# Patient Record
Sex: Female | Born: 1935 | Race: White | Hispanic: No | Smoking: Former smoker
Health system: Southern US, Community
[De-identification: ages and names within clinical notes are randomized; demographics above are authoritative.]

## PROBLEM LIST (undated history)

## (undated) DIAGNOSIS — F411 Generalized anxiety disorder: Secondary | ICD-10-CM

## (undated) DIAGNOSIS — M797 Fibromyalgia: Secondary | ICD-10-CM

## (undated) DIAGNOSIS — M1990 Unspecified osteoarthritis, unspecified site: Secondary | ICD-10-CM

## (undated) DIAGNOSIS — K219 Gastro-esophageal reflux disease without esophagitis: Secondary | ICD-10-CM

## (undated) DIAGNOSIS — K5730 Diverticulosis of large intestine without perforation or abscess without bleeding: Secondary | ICD-10-CM

## (undated) DIAGNOSIS — K5900 Constipation, unspecified: Secondary | ICD-10-CM

## (undated) DIAGNOSIS — J449 Chronic obstructive pulmonary disease, unspecified: Secondary | ICD-10-CM

## (undated) DIAGNOSIS — F17210 Nicotine dependence, cigarettes, uncomplicated: Secondary | ICD-10-CM

## (undated) DIAGNOSIS — M2042 Other hammer toe(s) (acquired), left foot: Secondary | ICD-10-CM

## (undated) DIAGNOSIS — I671 Cerebral aneurysm, nonruptured: Secondary | ICD-10-CM

## (undated) DIAGNOSIS — D62 Acute posthemorrhagic anemia: Secondary | ICD-10-CM

## (undated) DIAGNOSIS — IMO0002 Reserved for concepts with insufficient information to code with codable children: Secondary | ICD-10-CM

## (undated) DIAGNOSIS — E7800 Pure hypercholesterolemia, unspecified: Secondary | ICD-10-CM

## (undated) DIAGNOSIS — J45909 Unspecified asthma, uncomplicated: Secondary | ICD-10-CM

## (undated) DIAGNOSIS — K635 Polyp of colon: Secondary | ICD-10-CM

## (undated) DIAGNOSIS — I10 Essential (primary) hypertension: Secondary | ICD-10-CM

## (undated) DIAGNOSIS — F419 Anxiety disorder, unspecified: Secondary | ICD-10-CM

## (undated) HISTORY — PX: TONSILLECTOMY AND ADENOIDECTOMY: SHX28

## (undated) HISTORY — DX: Anxiety disorder, unspecified: F41.9

## (undated) HISTORY — DX: Fibromyalgia: M79.7

## (undated) HISTORY — DX: Chronic obstructive pulmonary disease, unspecified: J44.9

## (undated) HISTORY — DX: Cerebral aneurysm, nonruptured: I67.1

## (undated) HISTORY — DX: Generalized anxiety disorder: F41.1

## (undated) HISTORY — PX: COLONOSCOPY: SHX174

## (undated) HISTORY — DX: Diverticulosis of large intestine without perforation or abscess without bleeding: K57.30

## (undated) HISTORY — DX: Acute posthemorrhagic anemia: D62

## (undated) HISTORY — DX: Unspecified osteoarthritis, unspecified site: M19.90

## (undated) HISTORY — DX: Gastro-esophageal reflux disease without esophagitis: K21.9

## (undated) HISTORY — DX: Pure hypercholesterolemia, unspecified: E78.00

## (undated) HISTORY — DX: Nicotine dependence, cigarettes, uncomplicated: F17.210

## (undated) HISTORY — DX: Polyp of colon: K63.5

## (undated) HISTORY — DX: Constipation, unspecified: K59.00

## (undated) HISTORY — PX: OTHER SURGICAL HISTORY: SHX169

## (undated) HISTORY — DX: Reserved for concepts with insufficient information to code with codable children: IMO0002

## (undated) HISTORY — DX: Other hammer toe(s) (acquired), left foot: M20.42

---

## 1999-01-07 ENCOUNTER — Emergency Department (HOSPITAL_COMMUNITY): Admission: EM | Admit: 1999-01-07 | Discharge: 1999-01-07 | Payer: Self-pay | Admitting: *Deleted

## 1999-01-07 ENCOUNTER — Encounter: Payer: Self-pay | Admitting: Emergency Medicine

## 1999-01-28 ENCOUNTER — Encounter: Payer: Self-pay | Admitting: Pulmonary Disease

## 1999-01-28 ENCOUNTER — Ambulatory Visit (HOSPITAL_COMMUNITY): Admission: RE | Admit: 1999-01-28 | Discharge: 1999-01-28 | Payer: Self-pay | Admitting: Pulmonary Disease

## 2000-10-03 ENCOUNTER — Other Ambulatory Visit: Admission: RE | Admit: 2000-10-03 | Discharge: 2000-10-03 | Payer: Self-pay | Admitting: *Deleted

## 2003-12-17 ENCOUNTER — Ambulatory Visit: Payer: Self-pay | Admitting: Pulmonary Disease

## 2004-04-27 ENCOUNTER — Ambulatory Visit: Payer: Self-pay | Admitting: Internal Medicine

## 2004-05-05 ENCOUNTER — Ambulatory Visit: Payer: Self-pay | Admitting: Internal Medicine

## 2004-06-22 ENCOUNTER — Ambulatory Visit: Payer: Self-pay | Admitting: Internal Medicine

## 2004-07-09 ENCOUNTER — Ambulatory Visit: Payer: Self-pay | Admitting: Internal Medicine

## 2004-07-14 ENCOUNTER — Encounter: Admission: RE | Admit: 2004-07-14 | Discharge: 2004-07-14 | Payer: Self-pay | Admitting: Internal Medicine

## 2004-09-01 ENCOUNTER — Ambulatory Visit: Payer: Self-pay | Admitting: Internal Medicine

## 2004-09-08 ENCOUNTER — Ambulatory Visit: Payer: Self-pay | Admitting: Internal Medicine

## 2004-09-08 ENCOUNTER — Encounter (INDEPENDENT_AMBULATORY_CARE_PROVIDER_SITE_OTHER): Payer: Self-pay | Admitting: Specialist

## 2004-12-10 ENCOUNTER — Ambulatory Visit: Payer: Self-pay | Admitting: Pulmonary Disease

## 2005-01-19 ENCOUNTER — Ambulatory Visit: Payer: Self-pay | Admitting: Pulmonary Disease

## 2005-12-16 ENCOUNTER — Ambulatory Visit: Payer: Self-pay | Admitting: Pulmonary Disease

## 2005-12-17 ENCOUNTER — Ambulatory Visit: Payer: Self-pay

## 2005-12-21 LAB — CONVERTED CEMR LAB
ALT: 16 units/L (ref 0–40)
AST: 23 units/L (ref 0–37)
Albumin: 4 g/dL (ref 3.5–5.2)
Alkaline Phosphatase: 72 units/L (ref 39–117)
BUN: 8 mg/dL (ref 6–23)
Basophils Absolute: 0 10*3/uL (ref 0.0–0.1)
Basophils Relative: 0.7 % (ref 0.0–1.0)
Bilirubin Urine: NEGATIVE
CO2: 30 meq/L (ref 19–32)
Calcium: 9.4 mg/dL (ref 8.4–10.5)
Chloride: 106 meq/L (ref 96–112)
Chol/HDL Ratio, serum: 4.2
Cholesterol: 214 mg/dL (ref 0–200)
Creatinine, Ser: 0.8 mg/dL (ref 0.4–1.2)
Crystals: NEGATIVE
Eosinophil percent: 0.7 % (ref 0.0–5.0)
GFR calc non Af Amer: 75 mL/min
Glomerular Filtration Rate, Af Am: 91 mL/min/{1.73_m2}
Glucose, Bld: 97 mg/dL (ref 70–99)
HCT: 38.8 % (ref 36.0–46.0)
HDL: 50.8 mg/dL (ref >39.0)
Hemoglobin, Urine: NEGATIVE
Hemoglobin: 13.2 g/dL (ref 12.0–15.0)
Ketones, ur: NEGATIVE mg/dL
LDL DIRECT: 124.9 mg/dL
Lymphocytes Relative: 26.1 % (ref 12.0–46.0)
MCHC: 33.9 g/dL (ref 30.0–36.0)
MCV: 94.9 fL (ref 78.0–100.0)
Monocytes Absolute: 0.4 10*3/uL (ref 0.2–0.7)
Monocytes Relative: 7.1 % (ref 3.0–11.0)
Mucus, UA: NEGATIVE
Neutro Abs: 3.7 10*3/uL (ref 1.4–7.7)
Neutrophils Relative %: 65.4 % (ref 43.0–77.0)
Nitrite: NEGATIVE
Platelets: 326 10*3/uL (ref 150–400)
Potassium: 4.5 meq/L (ref 3.5–5.1)
RBC / HPF: NONE SEEN
RBC: 4.09 M/uL (ref 3.87–5.11)
RDW: 12.9 % (ref 11.5–14.6)
Sodium: 142 meq/L (ref 135–145)
Specific Gravity, Urine: 1.01 (ref 1.000–1.03)
TSH: 0.95 microintl units/mL (ref 0.35–5.50)
Total Bilirubin: 0.6 mg/dL (ref 0.3–1.2)
Total Protein, Urine: NEGATIVE mg/dL
Total Protein: 6.8 g/dL (ref 6.0–8.3)
Triglyceride fasting, serum: 130 mg/dL (ref 0–149)
Urine Glucose: NEGATIVE mg/dL
Urobilinogen, UA: 0.2 (ref 0.0–1.0)
VLDL: 26 mg/dL (ref 0–40)
WBC: 5.5 10*3/uL (ref 4.5–10.5)
pH: 5.5 (ref 5.0–8.0)

## 2005-12-30 ENCOUNTER — Ambulatory Visit: Payer: Self-pay | Admitting: Pulmonary Disease

## 2005-12-30 LAB — CONVERTED CEMR LAB
Fecal Occult Blood: NEGATIVE
OCCULT 1: NEGATIVE
OCCULT 2: NEGATIVE
OCCULT 3: NEGATIVE
OCCULT 4: NEGATIVE
OCCULT 5: NEGATIVE

## 2006-12-19 ENCOUNTER — Ambulatory Visit: Payer: Self-pay | Admitting: Pulmonary Disease

## 2006-12-19 DIAGNOSIS — M1990 Unspecified osteoarthritis, unspecified site: Secondary | ICD-10-CM | POA: Insufficient documentation

## 2006-12-19 DIAGNOSIS — D126 Benign neoplasm of colon, unspecified: Secondary | ICD-10-CM

## 2006-12-19 DIAGNOSIS — M810 Age-related osteoporosis without current pathological fracture: Secondary | ICD-10-CM

## 2006-12-19 DIAGNOSIS — J449 Chronic obstructive pulmonary disease, unspecified: Secondary | ICD-10-CM | POA: Insufficient documentation

## 2006-12-19 DIAGNOSIS — K219 Gastro-esophageal reflux disease without esophagitis: Secondary | ICD-10-CM

## 2006-12-19 DIAGNOSIS — M797 Fibromyalgia: Secondary | ICD-10-CM

## 2006-12-19 HISTORY — DX: Gastro-esophageal reflux disease without esophagitis: K21.9

## 2006-12-19 HISTORY — DX: Unspecified osteoarthritis, unspecified site: M19.90

## 2007-02-16 DIAGNOSIS — K635 Polyp of colon: Secondary | ICD-10-CM

## 2007-02-16 HISTORY — PX: CATARACT EXTRACTION, BILATERAL: SHX1313

## 2007-02-16 HISTORY — DX: Polyp of colon: K63.5

## 2007-06-19 ENCOUNTER — Ambulatory Visit: Payer: Self-pay | Admitting: Pulmonary Disease

## 2007-06-19 DIAGNOSIS — E7800 Pure hypercholesterolemia, unspecified: Secondary | ICD-10-CM

## 2007-06-19 LAB — CONVERTED CEMR LAB: Vit D, 1,25-Dihydroxy: 32 (ref 30–89)

## 2007-06-26 LAB — CONVERTED CEMR LAB
ALT: 17 units/L (ref 0–35)
AST: 24 units/L (ref 0–37)
Albumin: 4 g/dL (ref 3.5–5.2)
Alkaline Phosphatase: 80 units/L (ref 39–117)
BUN: 8 mg/dL (ref 6–23)
Basophils Absolute: 0 10*3/uL (ref 0.0–0.1)
Basophils Relative: 1 % (ref 0.0–1.0)
Bilirubin Urine: NEGATIVE
Bilirubin, Direct: 0.1 mg/dL (ref 0.0–0.3)
CO2: 30 meq/L (ref 19–32)
Calcium: 9.6 mg/dL (ref 8.4–10.5)
Chloride: 107 meq/L (ref 96–112)
Cholesterol: 228 mg/dL (ref 0–200)
Creatinine, Ser: 0.7 mg/dL (ref 0.4–1.2)
Crystals: NEGATIVE
Direct LDL: 139.3 mg/dL
Eosinophils Absolute: 0 10*3/uL (ref 0.0–0.7)
Eosinophils Relative: 1 % (ref 0.0–5.0)
GFR calc Af Amer: 105 mL/min
GFR calc non Af Amer: 87 mL/min
Glucose, Bld: 111 mg/dL — ABNORMAL HIGH (ref 70–99)
HCT: 36.7 % (ref 36.0–46.0)
HDL: 48.1 mg/dL (ref >39.0)
Hemoglobin, Urine: NEGATIVE
Hemoglobin: 12.7 g/dL (ref 12.0–15.0)
Ketones, ur: NEGATIVE mg/dL
Lymphocytes Relative: 31.4 % (ref 12.0–46.0)
MCHC: 34.6 g/dL (ref 30.0–36.0)
MCV: 93.4 fL (ref 78.0–100.0)
Monocytes Absolute: 0.4 10*3/uL (ref 0.1–1.0)
Monocytes Relative: 8.6 % (ref 3.0–12.0)
Mucus, UA: NEGATIVE
Neutro Abs: 3 10*3/uL (ref 1.4–7.7)
Neutrophils Relative %: 58 % (ref 43.0–77.0)
Nitrite: NEGATIVE
Platelets: 289 10*3/uL (ref 150–400)
Potassium: 4.8 meq/L (ref 3.5–5.1)
RBC / HPF: NONE SEEN
RBC: 3.93 M/uL (ref 3.87–5.11)
RDW: 13.1 % (ref 11.5–14.6)
Sodium: 142 meq/L (ref 135–145)
Specific Gravity, Urine: <=1.005 (ref 1.000–1.03)
TSH: 1.07 microintl units/mL (ref 0.35–5.50)
Total Bilirubin: 0.6 mg/dL (ref 0.3–1.2)
Total CHOL/HDL Ratio: 4.7
Total Protein, Urine: NEGATIVE mg/dL
Total Protein: 6.6 g/dL (ref 6.0–8.3)
Triglycerides: 200 mg/dL — ABNORMAL HIGH (ref 0–149)
Urine Glucose: NEGATIVE mg/dL
Urobilinogen, UA: 0.2 (ref 0.0–1.0)
VLDL: 40 mg/dL (ref 0–40)
WBC: 5 10*3/uL (ref 4.5–10.5)
pH: 6 (ref 5.0–8.0)

## 2007-07-02 DIAGNOSIS — F411 Generalized anxiety disorder: Secondary | ICD-10-CM

## 2007-07-02 HISTORY — DX: Generalized anxiety disorder: F41.1

## 2007-11-06 ENCOUNTER — Ambulatory Visit: Payer: Self-pay | Admitting: Internal Medicine

## 2007-11-08 ENCOUNTER — Telehealth: Payer: Self-pay | Admitting: Internal Medicine

## 2007-11-21 ENCOUNTER — Ambulatory Visit: Payer: Self-pay | Admitting: Internal Medicine

## 2007-11-21 ENCOUNTER — Encounter: Payer: Self-pay | Admitting: Internal Medicine

## 2007-11-29 ENCOUNTER — Encounter: Payer: Self-pay | Admitting: Internal Medicine

## 2008-01-09 ENCOUNTER — Ambulatory Visit: Payer: Self-pay | Admitting: Pulmonary Disease

## 2008-06-27 ENCOUNTER — Telehealth (INDEPENDENT_AMBULATORY_CARE_PROVIDER_SITE_OTHER): Payer: Self-pay | Admitting: *Deleted

## 2008-08-23 ENCOUNTER — Telehealth: Payer: Self-pay | Admitting: Pulmonary Disease

## 2008-08-28 ENCOUNTER — Ambulatory Visit: Payer: Self-pay | Admitting: Pulmonary Disease

## 2008-09-05 ENCOUNTER — Ambulatory Visit: Payer: Self-pay | Admitting: Pulmonary Disease

## 2008-09-05 DIAGNOSIS — H9190 Unspecified hearing loss, unspecified ear: Secondary | ICD-10-CM

## 2008-09-05 LAB — CONVERTED CEMR LAB
ALT: 17 units/L (ref 0–35)
AST: 22 units/L (ref 0–37)
Albumin: 4 g/dL (ref 3.5–5.2)
Alkaline Phosphatase: 75 units/L (ref 39–117)
BUN: 9 mg/dL (ref 6–23)
Basophils Absolute: 0 10*3/uL (ref 0.0–0.1)
Basophils Relative: 0.6 % (ref 0.0–3.0)
Bilirubin, Direct: 0.1 mg/dL (ref 0.0–0.3)
CO2: 30 meq/L (ref 19–32)
Calcium: 9.2 mg/dL (ref 8.4–10.5)
Chloride: 105 meq/L (ref 96–112)
Cholesterol: 219 mg/dL — ABNORMAL HIGH (ref 0–200)
Creatinine, Ser: 0.6 mg/dL (ref 0.4–1.2)
Direct LDL: 126.7 mg/dL
Eosinophils Absolute: 0 10*3/uL (ref 0.0–0.7)
Eosinophils Relative: 0.2 % (ref 0.0–5.0)
GFR calc non Af Amer: 103.61 mL/min (ref >60)
Glucose, Bld: 94 mg/dL (ref 70–99)
HCT: 37.6 % (ref 36.0–46.0)
HDL: 54.1 mg/dL (ref >39.00)
Hemoglobin: 12.9 g/dL (ref 12.0–15.0)
Lymphocytes Relative: 32 % (ref 12.0–46.0)
Lymphs Abs: 1.8 10*3/uL (ref 0.7–4.0)
MCHC: 34.2 g/dL (ref 30.0–36.0)
MCV: 94.6 fL (ref 78.0–100.0)
Monocytes Absolute: 0.4 10*3/uL (ref 0.1–1.0)
Monocytes Relative: 7.2 % (ref 3.0–12.0)
Neutro Abs: 3.4 10*3/uL (ref 1.4–7.7)
Neutrophils Relative %: 60 % (ref 43.0–77.0)
Platelets: 248 10*3/uL (ref 150.0–400.0)
Potassium: 4.3 meq/L (ref 3.5–5.1)
RBC: 3.98 M/uL (ref 3.87–5.11)
RDW: 13.3 % (ref 11.5–14.6)
Sodium: 141 meq/L (ref 135–145)
TSH: 1.13 microintl units/mL (ref 0.35–5.50)
Total Bilirubin: 0.9 mg/dL (ref 0.3–1.2)
Total CHOL/HDL Ratio: 4
Total Protein: 6.7 g/dL (ref 6.0–8.3)
Triglycerides: 171 mg/dL — ABNORMAL HIGH (ref 0.0–149.0)
VLDL: 34.2 mg/dL (ref 0.0–40.0)
WBC: 5.6 10*3/uL (ref 4.5–10.5)

## 2009-01-15 ENCOUNTER — Telehealth: Payer: Self-pay | Admitting: Pulmonary Disease

## 2009-03-28 ENCOUNTER — Telehealth (INDEPENDENT_AMBULATORY_CARE_PROVIDER_SITE_OTHER): Payer: Self-pay | Admitting: *Deleted

## 2009-08-11 ENCOUNTER — Telehealth (INDEPENDENT_AMBULATORY_CARE_PROVIDER_SITE_OTHER): Payer: Self-pay | Admitting: *Deleted

## 2009-08-13 ENCOUNTER — Ambulatory Visit: Payer: Self-pay | Admitting: Pulmonary Disease

## 2009-08-21 ENCOUNTER — Ambulatory Visit: Payer: Self-pay | Admitting: Pulmonary Disease

## 2009-08-26 LAB — CONVERTED CEMR LAB
AST: 24 units/L (ref 0–37)
Albumin: 4.1 g/dL (ref 3.5–5.2)
Alkaline Phosphatase: 65 units/L (ref 39–117)
BUN: 10 mg/dL (ref 6–23)
Basophils Relative: 0.7 % (ref 0.0–3.0)
Creatinine, Ser: 0.8 mg/dL (ref 0.4–1.2)
Eosinophils Absolute: 0 10*3/uL (ref 0.0–0.7)
GFR calc non Af Amer: 77.49 mL/min (ref >60)
Glucose, Bld: 86 mg/dL (ref 70–99)
HCT: 36.7 % (ref 36.0–46.0)
Hemoglobin: 12.5 g/dL (ref 12.0–15.0)
Lymphocytes Relative: 33.4 % (ref 12.0–46.0)
MCHC: 34.1 g/dL (ref 30.0–36.0)
Monocytes Relative: 6 % (ref 3.0–12.0)
Neutro Abs: 3.3 10*3/uL (ref 1.4–7.7)
Potassium: 5 meq/L (ref 3.5–5.1)
RBC: 3.87 M/uL (ref 3.87–5.11)
Total Protein: 6.5 g/dL (ref 6.0–8.3)
VLDL: 33.2 mg/dL (ref 0.0–40.0)

## 2010-02-26 ENCOUNTER — Telehealth: Payer: Self-pay | Admitting: Pulmonary Disease

## 2010-03-17 NOTE — Assessment & Plan Note (Signed)
Summary: 1 year return/lmhh   Primary Care Provider:  Alroy Dust MD  CC:  Yearly ROV & review of mult medical problems....  History of Present Illness: 77 y/o WF here for a follow up visit... she has mult med problems as noted... Followed for general medical purposes w/ hx of COPD, Hyperchol, GERD/ Diverics/ Polyps, DJD/ FM/ Osteoporosis, and anxiety... she likes to take various supplements, and she is also followed by Rodena Medin for GI, and DrKendall for Ortho...   ~  September 05, 2008:  she reports 2 falls in Dec09/ Jan10 w/ eval DrKendall, ortho- scoliosis, arthritis, & Rx'd w/ muscle relaxer- improved... generally stable w/ c/o aches/ pains- uses Tylenol etc... we discussed smoking cessation, f/u CXR, diet for cholesterol, and incr exercise... we reviewed her recent labs.   ~  August 21, 2009:  Yearly ROV w/ mult somatic complaints> states "I had the flu from Dec thru March", still smoking but less than 1 cig/d now "I could quit if I wanted to"... she notes that she gets thrush "whenever I'm around someone who died"... Chol poorly controlled on diet alone, but still refuses med Rx... she has mod DJD/ FM/ Osteoporosis on a variety of supplements including black cherry concentrate ("it's good- it took my friend's pain away")... CXR w/o acute changes & recent lab work reviewed.    Current Problems:   HEARING LOSS (ICD-389.9) - hx bilat stapes surg for hearing problems by DrKraus yrs ago... states she can't have MRI's due to the stapes implants... now sees DrBates w/ hearing aides bilat...  COPD (ICD-496) - states her breathing is OK and she denies SOB, ex= walking... only min cough/ no phlegm/ no congestion... she has declined to do PFT's in the past...  ~  CXR 7/10 showed   CIGARETTE SMOKER (ICD-305.1) - she still smokes "but only 1-2 cig per day"... she refuses Chantix stating that a friend tried it and died!!! We discussed other smoking cessation strategies...  HYPERCHOLESTEROLEMIA (ICD-272.0)  - she has refused statin Rx in the past... tried Simvastatin 20mg /d 5/09 but states that she developed "muscle cramps, throat closing & insomnia" which resolved off this med- refuses to try other meds "I'm on Fish Oil & diet"...  ~  FLP 11/07 showed Tchol 214, TG 130, HDL 51, LDL 125... refused statin, perfers diet alone...  ~  FLP 5/09 showed TChol 228, TG 200, HDL 48, LDL 139... rec- start Simva20... pt DC'd it.  ~  FLP 7/10 showed TChol 219, TG 171, HDL 54, LDL 127... refuses Fibrate or other lipid meds.  ~  FLP 6/11 showed TChol 234, TG 166, HDL 55, LDL 133... discussed diet Rx.  GERD (ICD-530.81)  -  on PEPCID 20mg Qd... followed for GI by DrGessner...  DIVERTICULOSIS OF COLON (ICD-562.10) COLONIC POLYPS (ICD-211.3) - colonoscopy 7/06 showed several 4-54mm adenomatous polyps in a long redundant colon w/ divertics & hems... f/u colonoscopy 10/09 showed divertics, several polyps= adenomatous & hyperpl w/ f/u colon planned for 2yrs... HEMORRHOIDS (ICD-455.6)  DEGENERATIVE JOINT DISEASE (ICD-715.90) - hx of leg pain w/ norm ABI's 2007... eval by Janett Billow for Ortho w/ "arthritis and scoliosis" treated w/ muscle relaxer and improved... she states she can't get MRI due to prev stapes surg...  ~  7/11: she notes that black cherry concentrate is good "it took afreind's pain away"...  FIBROMYALGIA (ICD-729.1) - on IMIPRAMINE 25mg - 2tabs daily...  OSTEOPOROSIS (ICD-733.00) - known severe osteoporosis w/ BMD 11/07 showing TScores -3.5 in spine & -2.6 in left fem neck.Marland KitchenMarland Kitchen  pt states intol to [facility] and refused other bisphosphonates or miacalcin... in addition she has refuses Endocrine referral to discuss further... "I take calcium and vitamins" but I carefully explained how that is not sufficient and that she is likely to suffer a serious compression fracture of her sp, or a fall could easily fracture her hip- she refuses Rx or further help... I have advised her to talk to her GYN about this as well.  ~  7/10:  we discussed osteoporosis options again including bisphos, reclast, miacalcin, forteo, & the new prolia- she is not interested in Rx or in Endocrine second opinion...  ~  7/11:  ditto...  ANXIETY (ICD-300.00)- she takes Tranxene at bedtime to help her sleep...  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 she takes ASA 81mg /d... had Tetanus shot 1997 & Pneumovax 2000... she refuses Flu shots!  ~  Supplement meds include> Calc w/ D, MVI, Tums, FishOil, BorageOil, FlaxSeedOil, etc...   Preventive Screening-Counseling & Management  Alcohol-Tobacco     Smoking Status: current  Allergies: 1)  ! Sulfa 2)  ! Simvastatin 3)  ! * Bisphosphonates  Comments:  Nurse/Medical Assistant: The patient's medications and allergies were reviewed with the patient and were updated in the Medication and Allergy Lists.  Past History:  Past Medical History: HEARING LOSS (ICD-389.9) COPD (ICD-496) CIGARETTE SMOKER (ICD-305.1) HYPERCHOLESTEROLEMIA (ICD-272.0) GERD (ICD-530.81) DIVERTICULOSIS OF COLON (ICD-562.10) COLONIC POLYPS (ICD-211.3) HEMORRHOIDS (ICD-455.6) DEGENERATIVE JOINT DISEASE (ICD-715.90) FIBROMYALGIA (ICD-729.1) OSTEOPOROSIS (ICD-733.00) ANXIETY (ICD-300.00)  Past Surgical History: S/P T & A as a child S/P stapes surg by DrKraus S/P bilat cataract surg 2009  Family History: Reviewed history from 09/05/2008 and no changes required. Father died from heart disease, COPD Mother died w/ HBP, arthritis 3 Brothers  Social History: Reviewed history from 09/05/2008 and no changes required. Retired, works some weekends [employer] Patient is a smoker Alcohol - occasional glass of wine Caffeine Use - daily coffee in the AM Patient gets regular exercise. Tries to walk daily for excercise  Review of Systems      See HPI       The patient complains of dyspnea on exertion, severe indigestion/heartburn, muscle weakness, and difficulty walking.  The patient denies  anorexia, fever, weight loss, weight gain, vision loss, decreased hearing, hoarseness, chest pain, syncope, peripheral edema, prolonged cough, headaches, hemoptysis, abdominal pain, melena, hematochezia, hematuria, incontinence, suspicious skin lesions, transient blindness, depression, unusual weight change, abnormal bleeding, enlarged lymph nodes, and angioedema.    Vital Signs:  Patient profile:   77 year old female Height:      63 inches Weight:      126 pounds BMI:     22.40 O2 Sat:      99 % on Room air Temp:     97.2 degrees F oral Pulse rate:   68 / minute BP sitting:   150 / 80  (left arm) Cuff size:   regular  Vitals Entered By: Randell Loop CMA (August 21, 2009 9:27 AM)  O2 Sat at Rest %:  99 O2 Flow:  Room air CC: Yearly ROV & review of mult medical problems... Is Patient Diabetic? No Pain Assessment Patient in pain? no      Comments meds updated today with pt   Physical Exam  Additional Exam:  WD, WN, 77 y/o WF in NAD... GENERAL:  Alert & oriented; pleasant & cooperative... HEENT:  [place]/AT, EOM-wnl, PERRLA, EACs-clear, TMs-wnl, NOSE-clear, THROAT-clear & wnl. NECK:  Supple w/ fairROM; no JVD;  normal carotid impulses w/o bruits; no thyromegaly or nodules palpated; no lymphadenopathy. CHEST:  decr BS bilat, clear to P & A; without wheezes/ rales/ or rhonchi heard... HEART:  Regular Rhythm; without murmurs/ rubs/ or gallops detected... ABDOMEN:  Soft & nontender; normal bowel sounds; no organomegaly or masses palpated... EXT: without deformities, mod arthritic changes; no varicose veins/ +venous insuffic/ no edema. NEURO:  CN's intact; no focal neuro deficits... DERM:  No lesions noted; prev nasal skin surg; no rash etc...    CXR  Procedure date:  08/21/2009  Findings:      CHEST - 2 VIEW Comparison: 720 HU 2010   Findings: The lungs are hyperexpanded but clear.  No confluent airspace opacities, pleural effusions or pneumothoraces are seen. The cardiac  silhouette is normal size and contour.  Mitral annular calcifications are noted.  Atherosclerotic calcifications are seen in the aortic arch.  The bones are mildly osteopenic and there is compression of at least 2 mid thoracic vertebral bodies which are unchanged compared to the prior.  There is mild curvature of the thoracic spine with the apex directed to the right which may be related to positioning.  The upper abdomen is normal.   IMPRESSION: Hyperexpansion without acute cardiopulmonary disease.   Read By:  Vincenza Hews    MISC. Report  Procedure date:  08/13/2009  Findings:      BMP (METABOL)   Sodium                    142 mEq/L                   135-145   Potassium                 5.0 mEq/L                   3.5-5.1   Chloride                  107 mEq/L                   96-112   Carbon Dioxide            30 mEq/L                    19-32   Glucose                   86 mg/dL                    16-10   BUN                       10 mg/dL                    9-60   Creatinine                0.8 mg/dL                   4.5-4.0   Calcium                   9.4 mg/dL                   9.8-11.9   GFR                       77.49 mL/min                >  60  Lipid Panel (LIPID)   Cholesterol          [H]  234 mg/dL                   1-610   Triglycerides        [H]  166.0 mg/dL                 9.6-045.4   HDL                       09.81 mg/dL                 >19.14 Cholesterol LDL - Direct                             133.0 mg/dL  CBC Platelet w/Diff (CBCD)   White Cell Count          5.6 K/uL                    4.5-10.5   Red Cell Count            3.87 Mil/uL                 3.87-5.11   Hemoglobin                12.5 g/dL                   78.2-95.6   Hematocrit                36.7 %                      36.0-46.0   MCV                       94.7 fl                     78.0-100.0   Platelet Count            262.0 K/uL                  150.0-400.0   Neutrophil %               59.8 %                      43.0-77.0   Lymphocyte %              33.4 %                      12.0-46.0   Monocyte %                6.0 %                       3.0-12.0   Eosinophils%              0.1 %                       0.0-5.0   Basophils %               0.7 %   Comments:      Hepatic/Liver Function Panel (HEPATIC)   Total Bilirubin  0.4 mg/dL                   0.1-6.0   Direct Bilirubin          0.1 mg/dL                   1.0-9.3   Alkaline Phosphatase      65 U/L                      39-117   AST                       24 U/L                      0-37   ALT                       20 U/L                      0-35   Total Protein             6.5 g/dL                    2.3-5.5   Albumin                   4.1 g/dL                    7.3-2.2  TSH (TSH)   FastTSH                   1.28 uIU/mL                 0.35-5.50  Vitamin D (25-Hydroxy) (02542)  Vitamin D (25-Hydroxy)                             53 ng/mL                    30-89   Impression & Recommendations:  Problem # 1:  COPD (ICD-496) She has mod COPD & still smokes a few> totally uninterested in smoking cessation help, neither does she want breathing meds, inhalers, etc... Orders: T-2 View CXR (71020TC)  Problem # 2:  HYPERCHOLESTEROLEMIA (ICD-272.0) Lipids not at goal on diet alone, but she refuses med Rx & is content to continue diet + herbal Rx "I'll have to work harder on my diet"...  Problem # 3:  GERD (ICD-530.81) Encouraged to take meds regua=larly... Her updated medication list for this problem includes:    Famotidine 20 Mg Tabs (Famotidine) .Marland Kitchen... 1 tab by mouth two times a day 30 minutes before breakfast and dinner    Tums 500 Mg Chew (Calcium carbonate antacid) .Marland Kitchen... As needed  Problem # 4:  DEGENERATIVE JOINT DISEASE (ICD-715.90) Followed by Janett Billow & treats herself w/ supplements etc... Her updated medication list for this problem includes:    Adult Aspirin Ec Low Strength 81 Mg Tbec  (Aspirin) .Marland Kitchen... Take 1 tablet by mouth once a day  Problem # 5:  OSTEOPOROSIS (ICD-733.00) Severe w/ compression fx's evident on CXR... we reviewed treatment options & she once again refuses all meds for this... asked to review options further w/ her GYN.  Problem # 6:  ANXIETY (ICD-300.00) She is stable on these meds and desires to  continue the same... Her updated medication list for this problem includes:    Imipramine Hcl 25 Mg Tabs (Imipramine hcl) .Marland Kitchen... Take 2 tablets by mouth at bedtime...    Clorazepate Dipotassium 7.5 Mg Tabs (Clorazepate dipotassium) .Marland Kitchen... Take 1/2 to 1 tab by mouth three times a day as needed for nerves...  Problem # 7:  OTHER MEDICAL PROBLEMS AS NOTED>>> OK TDAP today...  Complete Medication List: 1)  Adult Aspirin Ec Low Strength 81 Mg Tbec (Aspirin) .... Take 1 tablet by mouth once a day 2)  Famotidine 20 Mg Tabs (Famotidine) .Marland Kitchen.. 1 tab by mouth two times a day 30 minutes before breakfast and dinner 3)  Miralax Powd (Polyethylene glycol 3350) .... Once daily prn 4)  Dulcolax Stool Softener 100 Mg Caps (Docusate sodium) .... As needed 5)  Caltrate 600+d 600-400 Mg-unit Tabs (Calcium carbonate-vitamin d) .... Take 1 tab by mouth two times a day... 6)  Tums 500 Mg Chew (Calcium carbonate antacid) .... As needed 7)  Multivitamins Tabs (Multiple vitamin) .... Take 1 tablet by mouth once a day 8)  Imipramine Hcl 25 Mg Tabs (Imipramine hcl) .... Take 2 tablets by mouth at bedtime.Marland KitchenMarland Kitchen 9)  Clorazepate Dipotassium 7.5 Mg Tabs (Clorazepate dipotassium) .... Take 1/2 to 1 tab by mouth three times a day as needed for nerves... 10)  Fish Oil 1000 Mg Caps (Omega-3 fatty acids) .... Take 1 tablet by mouth once a day 11)  Borage Oil 1000 Mg Caps (Borage (borago officinalis)) .... Take 1 tablet by mouth once a day 12)  Sm Flax Seed Oil 1000 Mg Caps (Flaxseed (linseed)) .... Take 1 tablet by mouth once a day 13)  Mmw  .... 1 tsp swish gargle and swallow four times daily as  needed  Other Orders: Tdap => 72yrs IM (16109) Admin 1st Vaccine (60454)  Patient Instructions: 1)  Today we updated your med list- see below.... 2)  Continue your current medications the same... 3)  We reviewed your recent labs & you promised to do a better job w/ your low cholesterol/ low fat diet.Marland KitchenMarland Kitchen 4)  Today we did your follow up CXR... please call the "phone tree" in a few days for your results.Marland KitchenMarland Kitchen 5)  We gave your the combined Tetanus shot called the TDAP today (it should be good for 10 yrs)... 6)  Call for any problems.Marland KitchenMarland Kitchen 7)  Please schedule a follow-up appointment in 6 months.   Immunization History:  Pneumovax Immunization History:    Pneumovax:  historical (07/07/1998)  Immunizations Administered:  Tetanus Vaccine:    Vaccine Type: Tdap    Site: left deltoid    Mfr: BOOSTRIX    Dose: 0.5 ml    Route: IM    Given by: Randell Loop CMA    Exp. Date: 05/09/2011    Lot #: [id number]    VIS given: 01/03/07 version given August 21, 2009.

## 2010-03-17 NOTE — Progress Notes (Signed)
Summary: lab order  Phone Note Call from Patient   Caller: Patient Call For: nadel Summary of Call: pt need labs put in for cpx on 7/7 Initial call taken by: Rickard Patience,  August 11, 2009 11:48 AM  Follow-up for Phone Call        Pt would like to come in before cpx on July 7 for labs.  Will forward to SN-pls advise which labs pt needs.  Thanks! Gweneth Dimitri RN  August 11, 2009 12:04 PM   Additional Follow-up for Phone Call Additional follow up Details #1::        labs are in computer for pt to come in wednesday am .  pt is aware Randell Loop CMA  August 11, 2009 1:55 PM

## 2010-03-17 NOTE — Progress Notes (Signed)
Summary: rx  Phone Note Call from Patient Call back at Home Phone [phone]   Caller: Patient Call For: nadel Reason for Call: Talk to Nurse Summary of Call: congestion in head and chest, gotten worse.  Went away and then came back.  Wants something called in. [facility] [address] Initial call taken by: Eugene Gavia,  March 28, 2009 8:39 AM  Follow-up for Phone Call        Pt c/o productive cough x 3 days with yellow mucus, cold sweats, wheezing, and nausea. Pt denies fever, and S.O.B. Please advise. Thanks. Zackery Barefoot CMA  March 28, 2009 8:56 AM    per SN---ok for pt to have zpak #1  take as directed and use mucinex max 1200mg    1 by mouth two times a day with plenty of fluids Randell Loop [facility]  March 28, 2009 11:03 AM   Additional Follow-up for Phone Call Additional follow up Details #1::        Spoke with pt and made aware that rx for zpack sent and advised to get mucinex max strength and take 1 two times a day with plenty of fluids.  Pt verbalized understanding. Additional Follow-up by: Vernie Murders,  March 28, 2009 11:06 AM    New/Updated Medications: ZITHROMAX Z-PAK 250 MG TABS (AZITHROMYCIN) take as directed Prescriptions: ZITHROMAX Z-PAK 250 MG TABS (AZITHROMYCIN) take as directed  #1 x 0   Entered by:   Vernie Murders   Authorized by:   Michele Mcalpine MD   Signed by:   Vernie Murders on 03/28/2009   Method used:   Electronically to        [facility]  [address] [id number]* (retail)       [address]       [place], [place]  [address]       Ph: [phone] or [phone]       Fax: [phone]   RxID:   [id number]

## 2010-03-19 NOTE — Progress Notes (Signed)
Summary: prescription  Phone Note Call from Patient Call back at Home Phone [phone]   Caller: Patient Call For: Lisa King Summary of Call: Requests rx for samotidine 20mg .[facility] Initial call taken by: Darletta Moll,  February 26, 2010 9:35 AM  Follow-up for Phone Call        PT was denied refill for Famotidine by Dr Leone Payor office due to needing an appt.  Pt is now calling Dr Kriste Basque for Refill.  Please advise if Ok or refill.  Last ov with Dr Kriste Basque was 08-2009. Abigail Miyamoto RN  February 26, 2010 11:22 AM   Additional Follow-up for Phone Call Additional follow up Details #1::        per SN---ok to fill pepcid 20mg  #60  1 by mouth two times a day with as needed refills. thanks Randell Loop CMA  February 26, 2010 2:09 PM   refill sent. pt aware. Carron Curie CMA  February 26, 2010 2:14 PM     Prescriptions: FAMOTIDINE 20 MG TABS (FAMOTIDINE) 1 TAB by mouth two times a day 30 MINUTES BEFORE BREAKFAST AND DINNER  #60 Tablet x prn   Entered by:   Carron Curie CMA   Authorized by:   Michele Mcalpine MD   Signed by:   Carron Curie CMA on 02/26/2010   Method used:   Electronically to        [facility]  [address] [id number] (retail)       [address]       [place], [place]  [address]       Ph: [phone] or [phone]       Fax: [phone]   RxID:   [id number]

## 2010-05-11 ENCOUNTER — Other Ambulatory Visit: Payer: Self-pay | Admitting: Pulmonary Disease

## 2010-06-11 ENCOUNTER — Other Ambulatory Visit: Payer: Self-pay | Admitting: Pulmonary Disease

## 2010-07-14 ENCOUNTER — Encounter: Payer: Self-pay | Admitting: *Deleted

## 2010-07-14 ENCOUNTER — Ambulatory Visit (INDEPENDENT_AMBULATORY_CARE_PROVIDER_SITE_OTHER): Payer: Medicare Other | Admitting: Adult Health

## 2010-07-14 ENCOUNTER — Telehealth: Payer: Self-pay | Admitting: Pulmonary Disease

## 2010-07-14 VITALS — BP 128/80 | HR 78 | Temp 97.2°F | Ht 63.0 in | Wt 128.8 lb

## 2010-07-14 DIAGNOSIS — IMO0002 Reserved for concepts with insufficient information to code with codable children: Secondary | ICD-10-CM

## 2010-07-14 DIAGNOSIS — W540XXA Bitten by dog, initial encounter: Secondary | ICD-10-CM

## 2010-07-14 DIAGNOSIS — T148XXA Other injury of unspecified body region, initial encounter: Secondary | ICD-10-CM

## 2010-07-14 HISTORY — DX: Reserved for concepts with insufficient information to code with codable children: IMO0002

## 2010-07-14 NOTE — Progress Notes (Signed)
Subjective:    Patient ID: Lisa King, female    DOB: 1931-05-30, 77 y.o.   MRN: [id number]  HPI 77 yo WF with known hx of COPD, DJD, and Osteoporosis.   07/14/10 Acute OV  Pt presents for an acute work in visit. Complains of dog bite to left ankle 4 days ago. Pt was walking in neighborhood with friend when  A pitbull/german shepard mix came up behind them and bit her on left ankle (also bit her friend ). Police and animal control was contacted.  Dog's shot were up to date. Last Td was 08/2009   Complains of  bruising and swelling No redness or warm to touch. Cleaning daily with soap, water and alcohol.  No fever or increased pain. Tender to touch.  Had some intial drainage but that has resolved.  No difficulty with walking.   Review of Systems Constitutional:   No  weight loss, night sweats,  Fevers, chills, fatigue, or  lassitude.  HEENT:   No headaches,  Difficulty swallowing,  Tooth/dental problems, or  Sore throat,                No sneezing, itching, ear ache, nasal congestion, post nasal drip,   CV:  No chest pain,  Orthopnea, PND, swelling in lower extremities, anasarca, dizziness, palpitations, syncope.   GI  No heartburn, indigestion, abdominal pain, nausea, vomiting, diarrhea, change in bowel habits, loss of appetite, bloody stools.   Resp: No shortness of breath with exertion or at rest.  No excess mucus, no productive cough,  No non-productive cough,  No coughing up of blood.  No change in color of mucus.  No wheezing.  No chest wall deformity  Skin: + sore on left ankle   GU: no dysuria, change in color of urine, no urgency or frequency.  No flank pain, no hematuria   MS:  No joint pain or swelling.  No decreased range of motion.     Psych:  No change in mood or affect. No depression or anxiety.  No memory loss.         Objective:   Physical Exam GEN: A/Ox3; pleasant , NAD, elderly female   HEENT:  [place]/AT,  EACs-clear, TMs-wnl, NOSE-clear, THROAT-clear, no  lesions, no postnasal drip or exudate noted.   NECK:  Supple w/ fair ROM; no JVD; normal carotid impulses w/o bruits; no thyromegaly or nodules palpated; no lymphadenopathy.  RESP  Clear  P & A; w/o, wheezes/ rales/ or rhonchi.no accessory muscle use, no dullness to percussion  CARD:  RRR, no m/r/g  , no peripheral edema, pulses intact, no cyanosis or clubbing.  GI:   Soft & nt; nml bowel sounds; no organomegaly or masses detected.  Musco: Warm bil, no deformities Left ankle Joint ROM nml, nml gait   Neuro: alert, no focal deficits noted.    Skin: Warm,  Along left lateral ankle with puncture wound , no surrounding redness. Some mild eccyhmosis along ankle.           Assessment & Plan:

## 2010-07-14 NOTE — Patient Instructions (Signed)
Keep wound clean, wash with soap  And water daily  Cover until healed daily -apply neosporin.  Watch for sign of infection -redness, drainage or fever. -call back for office visit As needed   Please contact office for sooner follow up if symptoms do not improve or worsen or seek emergency care

## 2010-07-14 NOTE — Assessment & Plan Note (Signed)
Dog bite to left ankle on 07/10/10 with small puncture wound  Dogs shots were utd.  No sign of infection   Plan Keep wound clean, wash with soap  And water daily  Cover until healed daily -apply neosporin.  Watch for sign of infection -redness, drainage or fever. -call back for office visit As needed   Please contact office for sooner follow up if symptoms do not improve or worsen or seek emergency care

## 2010-07-14 NOTE — Telephone Encounter (Signed)
Patient coming to see TP @ 9:30am.

## 2010-07-23 ENCOUNTER — Telehealth: Payer: Self-pay | Admitting: Pulmonary Disease

## 2010-07-23 NOTE — Telephone Encounter (Signed)
Per SN---the rule is generally if the dog is not known to her--they normally give the rabies shots.   Per SN--she will need to set up appt at the health dept.  Called and spoke with pt and she was told that she would have to go to the hospital for these shots.  She stated that she is going to think about this and will call and let us know.

## 2010-07-23 NOTE — Telephone Encounter (Signed)
Spoke with pt.  She states bit by a dog on 5/25- seen by TP on 07/14/10. She states that she thinks that she may need abx b/c the HD wondered why she was not put on anything when she was seen. She states that the wound seems to be healing properly, with no sign of fever, redness, swelling, fluids.  She states that however, her "foot froze up last night and could not walk on it". Doing fine today. She states that she is also concerned b/c they can not locate the dog who bit her and now she needs rabies shots. She want so know what SN thinks about this since she is "allergic to everything". Please advise thanks!

## 2010-08-14 ENCOUNTER — Telehealth: Payer: Self-pay | Admitting: Pulmonary Disease

## 2010-08-14 DIAGNOSIS — R5383 Other fatigue: Secondary | ICD-10-CM | POA: Insufficient documentation

## 2010-08-14 DIAGNOSIS — F411 Generalized anxiety disorder: Secondary | ICD-10-CM

## 2010-08-14 DIAGNOSIS — D126 Benign neoplasm of colon, unspecified: Secondary | ICD-10-CM

## 2010-08-14 DIAGNOSIS — Z0000 Encounter for general adult medical examination without abnormal findings: Secondary | ICD-10-CM

## 2010-08-14 DIAGNOSIS — F17200 Nicotine dependence, unspecified, uncomplicated: Secondary | ICD-10-CM

## 2010-08-14 DIAGNOSIS — E7800 Pure hypercholesterolemia, unspecified: Secondary | ICD-10-CM

## 2010-08-14 NOTE — Telephone Encounter (Signed)
Pt requesting to come in on Monday for lab work -- she has pending OV with SN on July 6. Pls advise.  Thanks!

## 2010-08-14 NOTE — Telephone Encounter (Signed)
Lab order has been entered per TP and i called and spoke with pt and she is aware

## 2010-08-14 NOTE — Telephone Encounter (Signed)
Per TP: cbcd, bmet, lft, lipid, tsh, ua.  Thanks.

## 2010-08-17 ENCOUNTER — Other Ambulatory Visit (INDEPENDENT_AMBULATORY_CARE_PROVIDER_SITE_OTHER): Payer: Medicare Other

## 2010-08-17 ENCOUNTER — Other Ambulatory Visit (INDEPENDENT_AMBULATORY_CARE_PROVIDER_SITE_OTHER): Payer: Medicare Other | Admitting: Pulmonary Disease

## 2010-08-17 DIAGNOSIS — F411 Generalized anxiety disorder: Secondary | ICD-10-CM

## 2010-08-17 DIAGNOSIS — Z0000 Encounter for general adult medical examination without abnormal findings: Secondary | ICD-10-CM

## 2010-08-17 DIAGNOSIS — E7800 Pure hypercholesterolemia, unspecified: Secondary | ICD-10-CM

## 2010-08-17 LAB — HEPATIC FUNCTION PANEL
ALT: 19 U/L (ref 0–35)
AST: 21 U/L (ref 0–37)
Albumin: 4.2 g/dL (ref 3.5–5.2)
Total Bilirubin: 0.5 mg/dL (ref 0.3–1.2)
Total Protein: 6.4 g/dL (ref 6.0–8.3)

## 2010-08-17 LAB — BASIC METABOLIC PANEL
BUN: 10 mg/dL (ref 6–23)
CO2: 29 mEq/L (ref 19–32)
Chloride: 106 mEq/L (ref 96–112)
Creatinine, Ser: 0.7 mg/dL (ref 0.4–1.2)
Glucose, Bld: 94 mg/dL (ref 70–99)
Potassium: 4.5 mEq/L (ref 3.5–5.1)

## 2010-08-17 LAB — CBC WITH DIFFERENTIAL/PLATELET
Basophils Relative: 0.4 % (ref 0.0–3.0)
Eosinophils Relative: 0.2 % (ref 0.0–5.0)
Lymphocytes Relative: 32.2 % (ref 12.0–46.0)
MCV: 94.2 fl (ref 78.0–100.0)
Monocytes Absolute: 0.3 10*3/uL (ref 0.1–1.0)
Neutrophils Relative %: 60.6 % (ref 43.0–77.0)
Platelets: 260 10*3/uL (ref 150.0–400.0)
RBC: 3.64 Mil/uL — ABNORMAL LOW (ref 3.87–5.11)
WBC: 4.7 10*3/uL (ref 4.5–10.5)

## 2010-08-17 LAB — LIPID PANEL
Cholesterol: 196 mg/dL (ref 0–200)
Triglycerides: 134 mg/dL (ref 0.0–149.0)

## 2010-08-17 LAB — URINALYSIS, ROUTINE W REFLEX MICROSCOPIC
Bilirubin Urine: NEGATIVE
Hgb urine dipstick: NEGATIVE
Nitrite: NEGATIVE
pH: 8.5 (ref 5.0–8.0)

## 2010-08-17 LAB — TSH: TSH: 1.16 u[IU]/mL (ref 0.35–5.50)

## 2010-08-21 ENCOUNTER — Encounter: Payer: Self-pay | Admitting: Pulmonary Disease

## 2010-08-21 ENCOUNTER — Ambulatory Visit (INDEPENDENT_AMBULATORY_CARE_PROVIDER_SITE_OTHER)
Admission: RE | Admit: 2010-08-21 | Discharge: 2010-08-21 | Disposition: A | Discharge status: Home / Self Care | Payer: Medicare Other | Source: Ambulatory Visit | Attending: Pulmonary Disease | Admitting: Pulmonary Disease

## 2010-08-21 ENCOUNTER — Ambulatory Visit (INDEPENDENT_AMBULATORY_CARE_PROVIDER_SITE_OTHER): Payer: Medicare Other | Admitting: Pulmonary Disease

## 2010-08-21 DIAGNOSIS — IMO0001 Reserved for inherently not codable concepts without codable children: Secondary | ICD-10-CM

## 2010-08-21 DIAGNOSIS — J449 Chronic obstructive pulmonary disease, unspecified: Secondary | ICD-10-CM

## 2010-08-21 DIAGNOSIS — K5730 Diverticulosis of large intestine without perforation or abscess without bleeding: Secondary | ICD-10-CM

## 2010-08-21 DIAGNOSIS — M810 Age-related osteoporosis without current pathological fracture: Secondary | ICD-10-CM

## 2010-08-21 DIAGNOSIS — M1990 Unspecified osteoarthritis, unspecified site: Secondary | ICD-10-CM

## 2010-08-21 DIAGNOSIS — D126 Benign neoplasm of colon, unspecified: Secondary | ICD-10-CM

## 2010-08-21 DIAGNOSIS — K219 Gastro-esophageal reflux disease without esophagitis: Secondary | ICD-10-CM

## 2010-08-21 DIAGNOSIS — E7800 Pure hypercholesterolemia, unspecified: Secondary | ICD-10-CM

## 2010-08-21 DIAGNOSIS — F411 Generalized anxiety disorder: Secondary | ICD-10-CM

## 2010-08-21 MED ORDER — RISEDRONATE SODIUM 35 MG PO TBEC: DELAYED_RELEASE_TABLET | ORAL | Status: DC

## 2010-08-21 MED ORDER — FAMOTIDINE 20 MG PO TABS: 20.0000 mg | ORAL_TABLET | Freq: Two times a day (BID) | ORAL | Status: DC

## 2010-08-21 MED ORDER — IMIPRAMINE HCL 25 MG PO TABS: ORAL_TABLET | ORAL | Status: DC

## 2010-08-21 MED ORDER — CLORAZEPATE DIPOTASSIUM 7.5 MG PO TABS: ORAL_TABLET | ORAL | Status: DC

## 2010-08-21 NOTE — Progress Notes (Signed)
Subjective:    Patient ID: Lisa King, female    DOB: 1931-12-19, 77 y.o.   MRN: [id number]  HPI 77 y/o WF here for a follow up visit... she has mult med problems as noted...  Followed for general medical purposes w/ hx of COPD, Hyperchol, GERD/ Diverics/ Polyps, DJD/ FM/ Osteoporosis, and anxiety... she likes to take various supplements, and she is also followed by Rodena Medin for GI, and DrKendall for Ortho...  ~  September 05, 2008:  she reports 2 falls in Dec09/ Jan10 w/ eval DrKendall, ortho- scoliosis, arthritis, & Rx'd w/ muscle relaxer- improved... generally stable w/ c/o aches/ pains- uses Tylenol etc... we discussed smoking cessation, f/u CXR, diet for cholesterol, and incr exercise... we reviewed her recent labs.  ~  August 21, 2009:  Yearly ROV w/ mult somatic complaints> states "I had the flu from Dec thru March", still smoking but less than 1 cig/d now "I could quit if I wanted to"... she notes that she gets thrush "whenever I'm around someone who died"... Chol poorly controlled on diet alone, but still refuses med Rx... she has mod DJD/ FM/ Osteoporosis on a variety of supplements including black cherry concentrate ("it's good- it took my friend's pain away")... CXR w/o acute changes & recent lab work reviewed.  ~  August 21, 2010:  Yearly ROV & she states "I quit smoking but I cheat now & again";  She suffered a dog bite to left ankle 5/12 & still has some discomfort but wound is healed etc;  She notes some sinus drainage, but denies cough, phlegm, hemoptysis, CP, palpit, SOB, or edema;  We discussed her severe osteoporosis & the signif risk it imposes> she has refuses meds, endocrine consult, & hasn't f/u w/ GYN; we reviewed avail options & she has agreed to try ATELVIA 35mg /wk as this is supposed to be easier on the GI track...    CXR today is stable, NAD; and recent blood work looked good (see below)>>   Problem List:   HEARING LOSS (ICD-389.9) - hx bilat stapes surg for hearing problems by  DrKraus yrs ago... states she can't have MRI's due to the stapes implants... now sees DrBates w/ hearing aides bilat...  COPD (ICD-496) - states her breathing is OK and she denies SOB, ex= walking... She denies cough, sputum, hemoptysis, CP, palpit SOB, edema... she has declined to do PFT's in the past... ~  CXR 7/11 showed COPD, atherosclerotic calcif in Ao, osteopenic bones, compression of 2 mid thor vertebral bodies, NAD.Marland Kitchen. ~  CXR 7/12 showed hyperinflation, some apical pleural scarring, sm HH, mitral annular calcif, NAD...  CIGARETTE SMOKER (ICD-305.1) - states she's quit smoking now "but I cheat now & again"... she refuses Chantix stating that a friend tried it and died!!! We discussed other smoking cessation strategies...  HYPERCHOLESTEROLEMIA (ICD-272.0) - she has refused statin Rx in the past... tried Simvastatin 20mg /d 5/09 but states that she developed "muscle cramps, throat closing & insomnia" which resolved off this med- refuses to try other meds "I'm on Fish Oil & diet"... ~  FLP 11/07 showed Tchol 214, TG 130, HDL 51, LDL 125... refused statin, perfers diet alone... ~  FLP 5/09 showed TChol 228, TG 200, HDL 48, LDL 139... rec- start Simva20... pt DC'd it. ~  FLP 7/10 showed TChol 219, TG 171, HDL 54, LDL 127... refuses Fibrate or other lipid meds. ~  FLP 6/11 showed TChol 234, TG 166, HDL 55, LDL 133... discussed diet Rx. ~  FLP 6/12  showed TChol 196, TG 134, HDL 60, LDL 110... Improved on diet Rx!  GERD (ICD-530.81)  -  on PEPCID 20mg Qd but she is adamant that FAMOTIDINE works much better than Pepcid... followed for GI by DrGessner...  DIVERTICULOSIS OF COLON (ICD-562.10) - she denies D, C, blood, ch in bowel habits... COLONIC POLYPS (ICD-211.3) - colonoscopy 7/06 showed several 4-21mm adenomatous polyps in a long redundant colon w/ divertics & hems... f/u colonoscopy 10/09 showed divertics, several polyps= adenomatous & hyperpl w/ f/u colon planned for 63yrs... HEMORRHOIDS  (ICD-455.6)  DEGENERATIVE JOINT DISEASE (ICD-715.90) - hx of leg pain w/ norm ABI's 2007... eval by Janett Billow for Ortho w/ "arthritis and scoliosis" treated w/ muscle relaxer and improved... she states she can't get MRI due to prev stapes surg... ~  7/11: she notes that black cherry concentrate is good "it took a freind's pain away"...  FIBROMYALGIA (ICD-729.1) - on IMIPRAMINE 25mg - 2tabs daily... ~  7/12:  She notes that her FM pain was exac by the dog bite 5/12...  OSTEOPOROSIS (ICD-733.00) - known severe osteoporosis w/ BMD 11/07 showing TScores -3.5 in spine & -2.6 in left fem neck... pt states intol to [facility] and refused other bisphosphonates or miacalcin... in addition she has refuses Endocrine referral to discuss further... "I take calcium and vitamins" but I carefully explained how that is not sufficient and that she is likely to suffer a serious compression fracture of her sp, or a fall could easily fracture her hip- she refuses Rx or further help... I have advised her to talk to her GYN about this as well. ~  7/10:  We discussed osteoporosis options again including bisphos, reclast, miacalcin, forteo, & the new prolia- she is not interested in Rx or in Endocrine second opinion... ~  7/11:  Ditto... ~  7/12:  I reviewed all the above & offered the new ATELVIA 35mg /wk (fewer GI problems) and she agreed to try it, Rx written.  ANXIETY (ICD-300.00)- she takes Tranxene at bedtime to help her sleep...  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 she takes ASA 81mg /d... had Tetanus shot 1997 & Pneumovax 2000... she refuses Flu shots! ~  Supplement meds include> Calc w/ D, MVI, Tums, FishOil, BorageOil, FlaxSeedOil, etc...   Past Surgical History  Procedure Date  . Tonsillectomy and adenoidectomy     as a child  . Stapes surgery     Dr Dorma Russell  . Cataract extraction, bilateral 2009    Outpatient Encounter Prescriptions as of 08/21/2010  Medication Sig Dispense Refill  . aspirin 81 MG  tablet Take 2 tablets by mouth daily      . Borage, Borago officinalis, (BORAGE OIL) 1000 MG CAPS Take 2 capsules by mouth daily.       . calcium carbonate (TUMS - DOSED IN MG ELEMENTAL CALCIUM) 500 MG chewable tablet Chew 1 tablet by mouth daily.        . Calcium Carbonate-Vitamin D (CALTRATE 600+D) 600-400 MG-UNIT per tablet Take 1 tablet by mouth daily.        . clorazepate (TRANXENE) 7.5 MG tablet Use as directed  50 tablet  5  . docusate sodium (COLACE) 100 MG capsule Take 100 mg by mouth 2 (two) times daily.        . famotidine (PEPCID) 20 MG tablet Take 1 tablet (20 mg total) by mouth 2 (two) times daily.  30 tablet  11  . fish oil-omega-3 fatty acids 1000 MG capsule Take 2 g by mouth daily.        Marland Kitchen  imipramine (TOFRANIL) 25 MG tablet Take 2 tablets by mouth at bedtime  60 tablet  11  . Multiple Vitamins-Minerals (MULTIVITAMIN WITH MINERALS) tablet Take 1 tablet by mouth daily.        . naproxen sodium (ANAPROX) 220 MG tablet Take 220 mg by mouth 2 (two) times daily with a meal.        . polyethylene glycol (MIRALAX / GLYCOLAX) packet Take 17 g by mouth daily.        . Risedronate Sodium (ATELVIA) 35 MG TBEC Take one tablet by mouth once weekly  4 tablet  11  . DISCONTD: Flaxseed, Linseed, (FLAX SEED OIL) 1000 MG CAPS Take 1 capsule by mouth daily.          Allergies  Allergen Reactions  . Bisphosphonates     REACTION: [name]  . Simvastatin     REACTION: intolerant  . Sulfonamide Derivatives     REACTION: blisters/rash    Current Medications, Allergies, Past Medical History, Past Surgical History, Family History, and Social History were reviewed in [employer] record.   Review of Systems       See HPI - all other systems neg except as noted...       The patient complains of dyspnea on exertion, severe indigestion/heartburn, muscle weakness, and difficulty walking.  The patient denies anorexia, fever, weight loss, weight gain, vision loss,  decreased hearing, hoarseness, chest pain, syncope, peripheral edema, prolonged cough, headaches, hemoptysis, abdominal pain, melena, hematochezia, hematuria, incontinence, suspicious skin lesions, transient blindness, depression, unusual weight change, abnormal bleeding, enlarged lymph nodes, and angioedema.     Objective:   Physical Exam    WD, WN, 77 y/o WF in NAD... GENERAL:  Alert & oriented; pleasant & cooperative... HEENT:  [place]/AT, EOM-wnl, PERRLA, EACs-clear, TMs-wnl, NOSE-clear, THROAT-clear & wnl. NECK:  Supple w/ fairROM; no JVD; normal carotid impulses w/o bruits; no thyromegaly or nodules palpated; no lymphadenopathy. CHEST:  decr BS bilat, clear to P & A; without wheezes/ rales/ or rhonchi heard... HEART:  Regular Rhythm; without murmurs/ rubs/ or gallops detected... ABDOMEN:  Soft & nontender; normal bowel sounds; no organomegaly or masses palpated... EXT: without deformities, mod arthritic changes; no varicose veins/ +venous insuffic/ no edema. NEURO:  CN's intact; no focal neuro deficits... DERM:  No lesions noted; prev nasal skin surg; no rash etc...   Assessment & Plan:   COPD>  States she's quit smoking but stiil smokes some (cheating) & advised to quit completely... Denies SOB etc but she is clearly too sedentary & asked to incr activity/ exercise...  CHOL>  On diet alone & recent FLP is improved, keep up the good work...  GI>  GERD/ Divertics/ Polyps>  Followed by Rodena Medin for GI; stable on Famotidine which she swears works better that [employer]...  DJD/ FM>  Stable overall; uses OTC anti-inflamm as needed + Imipramine Qhs...  Osteoporosis/ Compression fx>  She has 2 compression fx that are old, she is very high risk, we had another long conversation about her osteoporosis & the risk it imposes, we reviewed the avail therapies (again) & she has agreed to try the new ATELVIA...  Anxiety>  She uses the Chlorazepate as needed.Marland KitchenMarland Kitchen

## 2010-08-21 NOTE — Patient Instructions (Signed)
Today we updated your med list in EPIC>    We refilled your meds per request...    We wrote for a new Osteoporosis med= ATELVIA (it is easier on the stomach)- take one tab weekly...  Today we did your follow up CXR... And we reviewed your recent blood work.    Please call the PHONE [facility]REE in a few days for your results...    Dial [id number] & when prompted enter your patient number followed by the # symbol...    Your patient number is:  [id number]  Continue your calcium, Multivit, & Vit D supplements...  Increase your exercise program if you are able...  Call for any questions.Marland KitchenMarland Kitchen

## 2010-08-22 ENCOUNTER — Encounter: Payer: Self-pay | Admitting: Pulmonary Disease

## 2010-09-08 ENCOUNTER — Telehealth: Payer: Self-pay | Admitting: Pulmonary Disease

## 2010-09-08 NOTE — Telephone Encounter (Signed)
PA request received from [facility] on [address] for [employer]. Per insurance, pt must first try and fail Alendronate. Pls advise if okay to change to Alendronate. [employer] # [phone]. Member ID # [id number].

## 2010-09-08 NOTE — Telephone Encounter (Signed)
Per SN--she has already tried and failed fosamax---due to the side effects.   She needs the atelvia.  thanks

## 2010-09-08 NOTE — Telephone Encounter (Signed)
Per [employer] their computers are down and they asked that we call back in 1 hour. Will hold in triage so this can be called for tomorrow.

## 2010-09-09 NOTE — Telephone Encounter (Signed)
Per [employer], PA needs to be done through [facility] at [phone]. Awaiting PA form from [facility].

## 2010-09-09 NOTE — Telephone Encounter (Signed)
Form received and placed on SN cart for review and signature. Will then fax back to [facility].

## 2010-09-14 ENCOUNTER — Telehealth: Payer: Self-pay | Admitting: Pulmonary Disease

## 2010-09-14 NOTE — Telephone Encounter (Signed)
There is a PA pending on this, SN pls advise thanks!

## 2010-09-15 ENCOUNTER — Telehealth: Payer: Self-pay | Admitting: Pulmonary Disease

## 2010-09-15 NOTE — Telephone Encounter (Signed)
Called and spoke with pt and she stated that she has not taken the atelvia yet due to her not knowing what the side effects of the drug are.  She is going to call the pharmacy and find out before she takes this med.  She is aware that we are doing the PA for the atelvia and she stated that she has tried many different meds for this but the fosamax is the one that caused her severe nausea all the time.

## 2010-09-15 NOTE — Telephone Encounter (Signed)
No msg needed.

## 2010-09-15 NOTE — Telephone Encounter (Signed)
See other phone note about this PA for atelvia.

## 2010-09-23 NOTE — Telephone Encounter (Signed)
Called and spoke with pt to let her know that she Lisa King was denied by her insurance and in order to get it approved we had to appeal their decision.  Called to make pt aware of what was taking so long and she stated to not even bother with the process because she is not going to take this med.  She stated that she did research on the internet of this med and she is not going to take med due to all the side effects she found.  SN is aware of pts decision.

## 2010-09-23 NOTE — Telephone Encounter (Signed)
Called to check on the status of the PA for the atelvia for the pt---was informed that this has been denied due to them not receiving information from the provider. i explained to [employer] company that this form was faxed back on 8-1.  He stated that they have not received that form and the med has been denied.  lmomtcb for pt

## 2010-12-23 ENCOUNTER — Telehealth: Payer: Self-pay | Admitting: Pulmonary Disease

## 2010-12-23 MED ORDER — AMOXICILLIN-POT CLAVULANATE 875-125 MG PO TABS: 1.0000 | ORAL_TABLET | Freq: Two times a day (BID) | ORAL | Status: AC

## 2010-12-23 NOTE — Telephone Encounter (Signed)
Called and spoke with pt. She c/o prod cough with greenish yellow sputum x 3 days, sinus pressure and "ears stopped up".  She states that she "thinks" had chills and sweats a few days ago, but denies fever currently. She states would like something called in. Please advise, thanks! Allergies  Allergen Reactions  . Bisphosphonates     REACTION: [name]  . Simvastatin     REACTION: intolerant  . Sulfonamide Derivatives     REACTION: blisters/rash

## 2010-12-23 NOTE — Telephone Encounter (Signed)
Per SN---ok for pt to have augmentin 875mg    #14  1 po bid until gone.  And also take the mucinex 1-2 po bid with plenty of fluids.  Called and spoke with pt and she is aware of augmentin that has been sent to her pharmacy.

## 2011-03-09 ENCOUNTER — Other Ambulatory Visit: Payer: Self-pay | Admitting: Pulmonary Disease

## 2011-06-04 DIAGNOSIS — H52209 Unspecified astigmatism, unspecified eye: Secondary | ICD-10-CM | POA: Diagnosis not present

## 2011-06-04 DIAGNOSIS — Z961 Presence of intraocular lens: Secondary | ICD-10-CM | POA: Diagnosis not present

## 2011-08-16 ENCOUNTER — Other Ambulatory Visit: Payer: Self-pay | Admitting: Pulmonary Disease

## 2011-08-16 ENCOUNTER — Telehealth: Payer: Self-pay | Admitting: Pulmonary Disease

## 2011-08-16 DIAGNOSIS — E559 Vitamin D deficiency, unspecified: Secondary | ICD-10-CM

## 2011-08-16 DIAGNOSIS — D126 Benign neoplasm of colon, unspecified: Secondary | ICD-10-CM

## 2011-08-16 DIAGNOSIS — E7800 Pure hypercholesterolemia, unspecified: Secondary | ICD-10-CM

## 2011-08-16 DIAGNOSIS — F411 Generalized anxiety disorder: Secondary | ICD-10-CM

## 2011-08-16 DIAGNOSIS — K5730 Diverticulosis of large intestine without perforation or abscess without bleeding: Secondary | ICD-10-CM

## 2011-08-16 NOTE — Telephone Encounter (Signed)
Please advise SN what labs pt will need to have done thanks

## 2011-08-16 NOTE — Telephone Encounter (Addendum)
Lab orders have been placed in the computer for the pt.   i called and lmom to make the pt aware.

## 2011-08-18 ENCOUNTER — Other Ambulatory Visit (INDEPENDENT_AMBULATORY_CARE_PROVIDER_SITE_OTHER): Payer: Medicare Other

## 2011-08-18 DIAGNOSIS — E7800 Pure hypercholesterolemia, unspecified: Secondary | ICD-10-CM

## 2011-08-18 DIAGNOSIS — E559 Vitamin D deficiency, unspecified: Secondary | ICD-10-CM | POA: Diagnosis not present

## 2011-08-18 DIAGNOSIS — F411 Generalized anxiety disorder: Secondary | ICD-10-CM | POA: Diagnosis not present

## 2011-08-18 DIAGNOSIS — K5730 Diverticulosis of large intestine without perforation or abscess without bleeding: Secondary | ICD-10-CM

## 2011-08-18 DIAGNOSIS — D126 Benign neoplasm of colon, unspecified: Secondary | ICD-10-CM | POA: Diagnosis not present

## 2011-08-18 LAB — CBC WITH DIFFERENTIAL/PLATELET
Basophils Relative: 1.1 % (ref 0.0–3.0)
Hemoglobin: 12.2 g/dL (ref 12.0–15.0)
Lymphocytes Relative: 34.2 % (ref 12.0–46.0)
Monocytes Relative: 6.8 % (ref 3.0–12.0)
Neutro Abs: 2.6 10*3/uL (ref 1.4–7.7)
Neutrophils Relative %: 56.1 % (ref 43.0–77.0)
RBC: 3.92 Mil/uL (ref 3.87–5.11)
WBC: 4.6 10*3/uL (ref 4.5–10.5)

## 2011-08-18 LAB — BASIC METABOLIC PANEL
BUN: 12 mg/dL (ref 6–23)
CO2: 28 mEq/L (ref 19–32)
Calcium: 9.2 mg/dL (ref 8.4–10.5)
Creatinine, Ser: 0.8 mg/dL (ref 0.4–1.2)
Glucose, Bld: 95 mg/dL (ref 70–99)

## 2011-08-18 LAB — LIPID PANEL
Cholesterol: 219 mg/dL — ABNORMAL HIGH (ref 0–200)
Total CHOL/HDL Ratio: 3
Triglycerides: 139 mg/dL (ref 0.0–149.0)

## 2011-08-18 LAB — HEPATIC FUNCTION PANEL
Albumin: 4.1 g/dL (ref 3.5–5.2)
Total Protein: 6.9 g/dL (ref 6.0–8.3)

## 2011-08-18 LAB — LDL CHOLESTEROL, DIRECT: Direct LDL: 114.7 mg/dL

## 2011-08-19 LAB — VITAMIN D 25 HYDROXY (VIT D DEFICIENCY, FRACTURES): Vit D, 25-Hydroxy: 52 ng/mL (ref 30–89)

## 2011-08-25 ENCOUNTER — Ambulatory Visit (INDEPENDENT_AMBULATORY_CARE_PROVIDER_SITE_OTHER): Payer: Medicare Other | Admitting: Pulmonary Disease

## 2011-08-25 ENCOUNTER — Encounter: Payer: Self-pay | Admitting: Pulmonary Disease

## 2011-08-25 ENCOUNTER — Ambulatory Visit (INDEPENDENT_AMBULATORY_CARE_PROVIDER_SITE_OTHER)
Admission: RE | Admit: 2011-08-25 | Discharge: 2011-08-25 | Disposition: A | Discharge status: Home / Self Care | Payer: Medicare Other | Source: Ambulatory Visit | Attending: Pulmonary Disease | Admitting: Pulmonary Disease

## 2011-08-25 VITALS — BP 110/78 | HR 69 | Temp 96.8°F | Ht 63.0 in | Wt 126.4 lb

## 2011-08-25 DIAGNOSIS — K5730 Diverticulosis of large intestine without perforation or abscess without bleeding: Secondary | ICD-10-CM

## 2011-08-25 DIAGNOSIS — E7800 Pure hypercholesterolemia, unspecified: Secondary | ICD-10-CM | POA: Diagnosis not present

## 2011-08-25 DIAGNOSIS — D126 Benign neoplasm of colon, unspecified: Secondary | ICD-10-CM

## 2011-08-25 DIAGNOSIS — J438 Other emphysema: Secondary | ICD-10-CM | POA: Diagnosis not present

## 2011-08-25 DIAGNOSIS — M1990 Unspecified osteoarthritis, unspecified site: Secondary | ICD-10-CM

## 2011-08-25 DIAGNOSIS — J449 Chronic obstructive pulmonary disease, unspecified: Secondary | ICD-10-CM

## 2011-08-25 DIAGNOSIS — M810 Age-related osteoporosis without current pathological fracture: Secondary | ICD-10-CM

## 2011-08-25 DIAGNOSIS — K219 Gastro-esophageal reflux disease without esophagitis: Secondary | ICD-10-CM

## 2011-08-25 DIAGNOSIS — J4489 Other specified chronic obstructive pulmonary disease: Secondary | ICD-10-CM

## 2011-08-25 DIAGNOSIS — IMO0001 Reserved for inherently not codable concepts without codable children: Secondary | ICD-10-CM

## 2011-08-25 DIAGNOSIS — F411 Generalized anxiety disorder: Secondary | ICD-10-CM

## 2011-08-25 MED ORDER — CYCLOBENZAPRINE HCL 10 MG PO TABS: ORAL_TABLET | ORAL | Status: DC

## 2011-08-25 NOTE — Progress Notes (Signed)
Subjective:    Patient ID: Lisa King, female    DOB: [birth year], 78 y.o.   MRN: [id number]  HPI 78 y/o WF here for a follow up visit... she has mult med problems as noted...  Followed for general medical purposes w/ hx of COPD, Hyperchol, GERD/ Diverics/ Polyps, DJD/ FM/ Osteoporosis, and anxiety... she likes to take various supplements, and she is also followed by Rodena Medin for GI, and DrKendall for Ortho...  ~  September 05, 2008:  she reports 2 falls in Dec09/ Jan10 w/ eval DrKendall, ortho- scoliosis, arthritis, & Rx'd w/ muscle relaxer- improved... generally stable w/ c/o aches/ pains- uses Tylenol etc... we discussed smoking cessation, f/u CXR, diet for cholesterol, and incr exercise... we reviewed her recent labs.  ~  August 21, 2009:  Yearly ROV w/ mult somatic complaints> states "I had the flu from Dec thru March", still smoking but less than 1 cig/d now "I could quit if I wanted to"... she notes that she gets thrush "whenever I'm around someone who died"... Chol poorly controlled on diet alone, but still refuses med Rx... she has mod DJD/ FM/ Osteoporosis on a variety of supplements including black cherry concentrate ("it's good- it took my friend's pain away")... CXR w/o acute changes & recent lab work reviewed.  ~  August 21, 2010:  Yearly ROV & she states "I quit smoking but I cheat now & again";  She suffered a dog bite to left ankle 5/12 & still has some discomfort but wound is healed etc;  She notes some sinus drainage, but denies cough, phlegm, hemoptysis, CP, palpit, SOB, or edema;  We discussed her severe osteoporosis & the signif risk it imposes> she has refuses meds, endocrine consult, & hasn't f/u w/ GYN; we reviewed avail options & she has agreed to try ATELVIA 35mg /wk as this is supposed to be easier on the GI track...    CXR today is stable, NAD; and recent blood work looked good (see below)>>  ~  August 25, 2011:  Yearly ROV & [name] says she fell 2 wks ago (trying to kill a spider) w/  some chest wall discomfort & requests a muscle relaxer; she still smokes a cig "now & then" and has underlying COPD but declines regular inhalers,etc; fortunately she has avoided major upper resp infections & denies cough, sputum, ch in SOB/DOE etc; she gets a little exercise by walking...     We reviewed prob list, meds, xrays and labs> see below for updates>> CXR 7/13 showed heart at the upper lim of norm in size, lungs are clear/ overinflated c/w COPD, apical pleural thickening, NAD... LABS 7/13:  FLP- not at goals on diet alone w/ TChol 219 LDL=115;  Chems- wnl;  CBC- wnl;  TSH=1.38;  VitD=52   Problem List:   HEARING LOSS (ICD-389.9) - hx bilat stapes surg for hearing problems by DrKraus yrs ago... states she can't have MRI's due to the stapes implants... now sees DrBates w/ hearing aides bilat...  COPD (ICD-496) - states her breathing is OK and she denies SOB, and declines use of inhalers, ex= walking... She denies cough, sputum, hemoptysis, CP, palpit SOB, edema... she has declined to do PFT's in the past... ~  CXR 7/11 showed COPD, atherosclerotic calcif in Ao, osteopenic bones, compression of 2 mid thor vertebral bodies, NAD.Marland Kitchen. ~  CXR 7/12 showed hyperinflation, some apical pleural scarring, sm HH, mitral annular calcif, NAD.Marland Kitchen. ~  CXR 7/13 showed heart at the upper lim of norm in size,  lungs are clear/ overinflated c/w COPD, apical pleural thickening, NAD...  CIGARETTE SMOKER (ICD-305.1) - states she's quit smoking now "but I cheat now & again"... she refuses Chantix stating that a friend tried it and died!!! We discussed other smoking cessation strategies... ~  7/13:  She states "I don't buy them anymore" but smokes now & then when she can get them...  HYPERCHOLESTEROLEMIA (ICD-272.0) - she has refused statin Rx in the past... tried Simvastatin 20mg /d 5/09 but states that she developed "muscle cramps, throat closing & insomnia" which resolved off this med- refuses to try other meds "I'm  on Fish Oil & diet"... ~  FLP 11/07 showed Tchol 214, TG 130, HDL 51, LDL 125... refused statin, perfers diet alone... ~  FLP 5/09 showed TChol 228, TG 200, HDL 48, LDL 139... rec- start Simva20... pt DC'd it. ~  FLP 7/10 showed TChol 219, TG 171, HDL 54, LDL 127... refuses Fibrate or other lipid meds. ~  FLP 6/11 showed TChol 234, TG 166, HDL 55, LDL 133... discussed diet Rx. ~  FLP 6/12 showed TChol 196, TG 134, HDL 60, LDL 110... Improved on diet Rx! ~  FLP 7/13 showed TChol 219, TG 139, HDL 68, LDL 115... Offered low dose statin but she declines...  GERD (ICD-530.81)  -  on PEPCID 20mg Qd but she is adamant that FAMOTIDINE works much better than Pepcid... followed for GI by DrGessner...  DIVERTICULOSIS OF COLON (ICD-562.10) - she denies D, C, blood, ch in bowel habits... COLONIC POLYPS (ICD-211.3) - colonoscopy 7/06 showed several 4-25mm adenomatous polyps in a long redundant colon w/ divertics & hems... f/u colonoscopy 10/09 showed divertics, several polyps= adenomatous & hyperpl w/ f/u colon planned for 46yrs... HEMORRHOIDS (ICD-455.6)  DEGENERATIVE JOINT DISEASE (ICD-715.90) - hx of leg pain w/ norm ABI's 2007... eval by Janett Billow for Ortho w/ "arthritis and scoliosis" treated w/ muscle relaxer and improved... she states she can't get MRI due to prev stapes surg... ~  7/11: she notes that black cherry concentrate is good "it took a freind's pain away"...  FIBROMYALGIA (ICD-729.1) - on IMIPRAMINE 25mg - 2tabs daily... ~  7/12:  She notes that her FM pain was exac by the dog bite 5/12...  OSTEOPOROSIS (ICD-733.00) - known severe osteoporosis w/ BMD 11/07 showing TScores -3.5 in spine & -2.6 in left fem neck... pt states intol to [facility] and refused other bisphosphonates or miacalcin... in addition she has refuses Endocrine referral to discuss further... "I take calcium and vitamins" but I carefully explained how that is not sufficient and that she is likely to suffer a serious compression fracture  of her sp, or a fall could easily fracture her hip- she refuses Rx or further help... I have advised her to talk to her GYN about this as well. ~  7/10:  We discussed osteoporosis options again including bisphos, reclast, miacalcin, forteo, & the new prolia- she is not interested in Rx or in Endocrine second opinion... ~  7/11:  Ditto... ~  7/12:  I reviewed all the above & offered the new ATELVIA 35mg /wk (fewer GI problems) and she agreed to try it, Rx written==> stopped on her own. ~  7/13:  We reviewed the situation w/ her bones, osteoporosis & her risks; she again refuses meds for this problem, and declines offers for subspec referral (Endocrine, etc)...  ANXIETY (ICD-300.00)- she takes Tranxene at bedtime to help her sleep...  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 she takes ASA 81mg /d... had Tetanus shot 1997 &  Pneumovax 2000... she refuses Flu shots! ~  Supplement meds include> Calc w/ D, MVI, Tums, FishOil, BorageOil, FlaxSeedOil, etc...   Past Surgical History  Procedure Date  . Tonsillectomy and adenoidectomy     as a child  . Stapes surgery     Dr Dorma Russell  . Cataract extraction, bilateral 2009    Outpatient Encounter Prescriptions as of 08/25/2011  Medication Sig Dispense Refill  . aspirin 81 MG tablet Take 2 tablets by mouth daily      . Borage, Borago officinalis, (BORAGE OIL) 1000 MG CAPS Take 2 capsules by mouth daily.       . calcium carbonate (TUMS - DOSED IN MG ELEMENTAL CALCIUM) 500 MG chewable tablet Chew 1 tablet by mouth daily.        . Calcium Carbonate-Vitamin D (CALTRATE 600+D) 600-400 MG-UNIT per tablet Take 1 tablet by mouth daily.        . clorazepate (TRANXENE) 7.5 MG tablet       . docusate sodium (COLACE) 100 MG capsule Take 100 mg by mouth daily.       . famotidine (PEPCID) 20 MG tablet Take 20 mg by mouth daily.      . fish oil-omega-3 fatty acids 1000 MG capsule Take 2 g by mouth daily.        Marland Kitchen imipramine (TOFRANIL) 25 MG tablet Take 2 tablets  by mouth at bedtime  60 tablet  11  . Misc Natural Products (TART CHERRY ADVANCED) CAPS Take 1 capsule by mouth daily.      . Multiple Vitamins-Minerals (MULTIVITAMIN WITH MINERALS) tablet Take 1 tablet by mouth daily.        . naproxen sodium (ANAPROX) 220 MG tablet Take 220 mg by mouth 2 (two) times daily with a meal.        . polyethylene glycol (MIRALAX / GLYCOLAX) packet Take 17 g by mouth daily.        Marland Kitchen DISCONTD: clorazepate (TRANXENE) 7.5 MG tablet take 1 tablet by mouth three times a day if needed  50 tablet  2  . DISCONTD: famotidine (PEPCID) 20 MG tablet Take 1 tablet (20 mg total) by mouth 2 (two) times daily.  30 tablet  11  . DISCONTD: Risedronate Sodium (ATELVIA) 35 MG TBEC Take one tablet by mouth once weekly  4 tablet  11    Allergies  Allergen Reactions  . Bisphosphonates     REACTION: [name]  . Simvastatin     REACTION: intolerant  . Sulfonamide Derivatives     REACTION: blisters/rash    Current Medications, Allergies, Past Medical History, Past Surgical History, Family History, and Social History were reviewed in [employer] record.   Review of Systems       See HPI - all other systems neg except as noted...       The patient complains of dyspnea on exertion, severe indigestion/heartburn, muscle weakness, and difficulty walking.  The patient denies anorexia, fever, weight loss, weight gain, vision loss, decreased hearing, hoarseness, chest pain, syncope, peripheral edema, prolonged cough, headaches, hemoptysis, abdominal pain, melena, hematochezia, hematuria, incontinence, suspicious skin lesions, transient blindness, depression, unusual weight change, abnormal bleeding, enlarged lymph nodes, and angioedema.     Objective:   Physical Exam    WD, WN, 78 y/o WF in NAD... GENERAL:  Alert & oriented; pleasant & cooperative... HEENT:  [place]/AT, EOM-wnl, PERRLA, EACs-clear, TMs-wnl, NOSE-clear, THROAT-clear & wnl. NECK:  Supple w/ fairROM;  no JVD; normal carotid impulses w/o  bruits; no thyromegaly or nodules palpated; no lymphadenopathy. CHEST:  decr BS bilat, clear to P & A; without wheezes/ rales/ or rhonchi heard... HEART:  Regular Rhythm; without murmurs/ rubs/ or gallops detected... ABDOMEN:  Soft & nontender; normal bowel sounds; no organomegaly or masses palpated... EXT: without deformities, mod arthritic changes; no varicose veins/ +venous insuffic/ no edema. NEURO:  CN's intact; no focal neuro deficits... DERM:  No lesions noted; prev nasal skin surg; no rash etc...  RADIOLOGY DATA:  Reviewed in the EPIC EMR & discussed w/ the patient...  LABORATORY DATA:  Reviewed in the EPIC EMR & discussed w/ the patient...   Assessment & Plan:   COPD>  States she's quit smoking but stiil smokes some (cheating) & advised to quit completely... Denies SOB etc but she is clearly too sedentary & asked to incr activity/ exercise...  CHOL>  On diet alone & recent FLP not at goal & she declines low dose statin to get her there...  GI>  GERD/ Divertics/ Polyps>  Followed by Rodena Medin for GI; stable on Famotidine which she swears works better that [employer]...  DJD/ FM>  Stable overall; uses OTC anti-inflamm as needed + Imipramine Qhs...  Osteoporosis/ Compression fx>  She has 2 compression fx that are old, she is very high risk, we had another long conversation about her osteoporosis & the risk it imposes, we reviewed the avail therapies (again) but she declines all avail therapies & refuses second opinion consult etc...   Anxiety>  She uses the Chlorazepate as needed...   Patient's Medications  New Prescriptions   CYCLOBENZAPRINE (FLEXERIL) 10 MG TABLET    Take 1/2 to 1 tablet by mouth three times daily as needed for muscle spasms  Previous Medications   ASPIRIN 81 MG TABLET    Take 2 tablets by mouth daily   BORAGE, BORAGO OFFICINALIS, (BORAGE OIL) 1000 MG CAPS    Take 2 capsules by mouth daily.    CALCIUM CARBONATE (TUMS - DOSED  IN MG ELEMENTAL CALCIUM) 500 MG CHEWABLE TABLET    Chew 1 tablet by mouth daily.     CALCIUM CARBONATE-VITAMIN D (CALTRATE 600+D) 600-400 MG-UNIT PER TABLET    Take 1 tablet by mouth daily.     DOCUSATE SODIUM (COLACE) 100 MG CAPSULE    Take 100 mg by mouth daily.    FISH OIL-OMEGA-3 FATTY ACIDS 1000 MG CAPSULE    Take 2 g by mouth daily.     IMIPRAMINE (TOFRANIL) 25 MG TABLET    Take 2 tablets by mouth at bedtime   MISC NATURAL PRODUCTS (TART CHERRY ADVANCED) CAPS    Take 1 capsule by mouth daily.   MULTIPLE VITAMINS-MINERALS (MULTIVITAMIN WITH MINERALS) TABLET    Take 1 tablet by mouth daily.     NAPROXEN SODIUM (ANAPROX) 220 MG TABLET    Take 220 mg by mouth 2 (two) times daily with a meal.     POLYETHYLENE GLYCOL (MIRALAX / GLYCOLAX) PACKET    Take 17 g by mouth daily.    Modified Medications   Modified Medication Previous Medication   CLORAZEPATE (TRANXENE) 7.5 MG TABLET clorazepate (TRANXENE) 7.5 MG tablet          take 1 tablet by mouth three times a day if needed   FAMOTIDINE (PEPCID) 20 MG TABLET famotidine (PEPCID) 20 MG tablet      take 1 tablet by mouth twice a day    Take 1 tablet (20 mg total) by mouth 2 (two) times daily.  Discontinued Medications   FAMOTIDINE (PEPCID) 20 MG TABLET    Take 20 mg by mouth daily.   RISEDRONATE SODIUM (ATELVIA) 35 MG TBEC    Take one tablet by mouth once weekly

## 2011-08-25 NOTE — Patient Instructions (Addendum)
Today we updated your med list in our EPIC system...    Continue your current medications the same...  We reviewed your recent FASTING blood work & gave you a copy for your records...  Today we did your follow up CXR...    We will call you w/ this report...  We wrote a new prescription for FLEXERIL (Cyclobenzaprine) 10mg - take 1/2 to 1 tab up to 3 times daily as needed for muscle spasm...  Call for any questions...  Be sure to check w/ your gynecologist about a medication for your osteoporosis...  Let's plan a routine follow up in 1 yr, sooner if needed for problems.Marland KitchenMarland Kitchen

## 2011-09-02 ENCOUNTER — Other Ambulatory Visit: Payer: Self-pay | Admitting: Pulmonary Disease

## 2011-09-07 ENCOUNTER — Other Ambulatory Visit: Payer: Self-pay | Admitting: Pulmonary Disease

## 2012-01-03 ENCOUNTER — Other Ambulatory Visit: Payer: Self-pay | Admitting: Pulmonary Disease

## 2012-01-05 ENCOUNTER — Telehealth: Payer: Self-pay | Admitting: Pulmonary Disease

## 2012-01-05 NOTE — Telephone Encounter (Signed)
Pt will call in Jan 2014 when needing a refill on her medications sent to the new pharmacy, [facility] on [address].

## 2012-03-02 ENCOUNTER — Telehealth: Payer: Self-pay | Admitting: Pulmonary Disease

## 2012-03-02 NOTE — Telephone Encounter (Signed)
I spoke with pt. She stated she needs all her prescriptions sent to [facility] bc she is changing pharmacies. i advised her to call [facility] and they will request all her rx's from [facility] for her. She voiced her understanding and will call [facility]. Nothing further was needed

## 2012-04-06 ENCOUNTER — Other Ambulatory Visit: Payer: Self-pay | Admitting: Pulmonary Disease

## 2012-04-06 MED ORDER — CLORAZEPATE DIPOTASSIUM 15 MG PO TABS: ORAL_TABLET | ORAL | Status: DC

## 2012-04-18 DIAGNOSIS — H6120 Impacted cerumen, unspecified ear: Secondary | ICD-10-CM | POA: Diagnosis not present

## 2012-04-18 DIAGNOSIS — J069 Acute upper respiratory infection, unspecified: Secondary | ICD-10-CM | POA: Diagnosis not present

## 2012-06-06 DIAGNOSIS — Z961 Presence of intraocular lens: Secondary | ICD-10-CM | POA: Diagnosis not present

## 2012-06-06 DIAGNOSIS — H52209 Unspecified astigmatism, unspecified eye: Secondary | ICD-10-CM | POA: Diagnosis not present

## 2012-06-06 DIAGNOSIS — H2640 Unspecified secondary cataract: Secondary | ICD-10-CM | POA: Diagnosis not present

## 2012-08-25 ENCOUNTER — Telehealth: Payer: Self-pay | Admitting: Pulmonary Disease

## 2012-08-25 ENCOUNTER — Ambulatory Visit: Payer: Medicare Other | Admitting: Pulmonary Disease

## 2012-08-25 DIAGNOSIS — E7800 Pure hypercholesterolemia, unspecified: Secondary | ICD-10-CM

## 2012-08-25 DIAGNOSIS — M810 Age-related osteoporosis without current pathological fracture: Secondary | ICD-10-CM

## 2012-08-25 DIAGNOSIS — K5730 Diverticulosis of large intestine without perforation or abscess without bleeding: Secondary | ICD-10-CM

## 2012-08-25 DIAGNOSIS — F411 Generalized anxiety disorder: Secondary | ICD-10-CM

## 2012-08-25 DIAGNOSIS — D126 Benign neoplasm of colon, unspecified: Secondary | ICD-10-CM

## 2012-08-25 NOTE — Telephone Encounter (Signed)
Called and spoke with pt and she is aware of lab orders in the computer. Nothing further is needed.

## 2012-08-28 ENCOUNTER — Other Ambulatory Visit (INDEPENDENT_AMBULATORY_CARE_PROVIDER_SITE_OTHER): Payer: Medicare Other

## 2012-08-28 DIAGNOSIS — E7800 Pure hypercholesterolemia, unspecified: Secondary | ICD-10-CM

## 2012-08-28 DIAGNOSIS — M810 Age-related osteoporosis without current pathological fracture: Secondary | ICD-10-CM | POA: Diagnosis not present

## 2012-08-28 DIAGNOSIS — K5730 Diverticulosis of large intestine without perforation or abscess without bleeding: Secondary | ICD-10-CM | POA: Diagnosis not present

## 2012-08-28 DIAGNOSIS — F411 Generalized anxiety disorder: Secondary | ICD-10-CM | POA: Diagnosis not present

## 2012-08-28 DIAGNOSIS — D126 Benign neoplasm of colon, unspecified: Secondary | ICD-10-CM

## 2012-08-28 LAB — LIPID PANEL
Cholesterol: 191 mg/dL (ref 0–200)
LDL Cholesterol: 109 mg/dL — ABNORMAL HIGH (ref 0–99)
Triglycerides: 107 mg/dL (ref 0.0–149.0)
VLDL: 21.4 mg/dL (ref 0.0–40.0)

## 2012-08-28 LAB — CBC WITH DIFFERENTIAL/PLATELET
Basophils Absolute: 0.1 10*3/uL (ref 0.0–0.1)
Eosinophils Absolute: 0.1 10*3/uL (ref 0.0–0.7)
HCT: 35.1 % — ABNORMAL LOW (ref 36.0–46.0)
Hemoglobin: 11.7 g/dL — ABNORMAL LOW (ref 12.0–15.0)
Lymphocytes Relative: 34.5 % (ref 12.0–46.0)
Lymphs Abs: 1.6 10*3/uL (ref 0.7–4.0)
MCHC: 33.4 g/dL (ref 30.0–36.0)
Neutro Abs: 2.6 10*3/uL (ref 1.4–7.7)
Platelets: 237 10*3/uL (ref 150.0–400.0)
RDW: 14.5 % (ref 11.5–14.6)

## 2012-08-28 LAB — BASIC METABOLIC PANEL
BUN: 15 mg/dL (ref 6–23)
Chloride: 107 mEq/L (ref 96–112)
Creatinine, Ser: 0.8 mg/dL (ref 0.4–1.2)
GFR: 72.51 mL/min (ref >60.00)
Glucose, Bld: 96 mg/dL (ref 70–99)

## 2012-08-28 LAB — HEPATIC FUNCTION PANEL
ALT: 17 U/L (ref 0–35)
Albumin: 3.8 g/dL (ref 3.5–5.2)
Total Bilirubin: 0.5 mg/dL (ref 0.3–1.2)

## 2012-08-31 ENCOUNTER — Encounter: Payer: Self-pay | Admitting: Pulmonary Disease

## 2012-08-31 ENCOUNTER — Ambulatory Visit (INDEPENDENT_AMBULATORY_CARE_PROVIDER_SITE_OTHER): Payer: Medicare Other | Admitting: Pulmonary Disease

## 2012-08-31 VITALS — BP 138/92 | HR 69 | Temp 97.2°F | Ht 63.0 in | Wt 129.2 lb

## 2012-08-31 DIAGNOSIS — K219 Gastro-esophageal reflux disease without esophagitis: Secondary | ICD-10-CM | POA: Diagnosis not present

## 2012-08-31 DIAGNOSIS — K5730 Diverticulosis of large intestine without perforation or abscess without bleeding: Secondary | ICD-10-CM | POA: Diagnosis not present

## 2012-08-31 DIAGNOSIS — M1990 Unspecified osteoarthritis, unspecified site: Secondary | ICD-10-CM

## 2012-08-31 DIAGNOSIS — F411 Generalized anxiety disorder: Secondary | ICD-10-CM

## 2012-08-31 DIAGNOSIS — M810 Age-related osteoporosis without current pathological fracture: Secondary | ICD-10-CM

## 2012-08-31 DIAGNOSIS — D126 Benign neoplasm of colon, unspecified: Secondary | ICD-10-CM

## 2012-08-31 DIAGNOSIS — E7800 Pure hypercholesterolemia, unspecified: Secondary | ICD-10-CM | POA: Diagnosis not present

## 2012-08-31 DIAGNOSIS — J449 Chronic obstructive pulmonary disease, unspecified: Secondary | ICD-10-CM

## 2012-08-31 DIAGNOSIS — H9190 Unspecified hearing loss, unspecified ear: Secondary | ICD-10-CM

## 2012-08-31 MED ORDER — CLORAZEPATE DIPOTASSIUM 7.5 MG PO TABS: ORAL_TABLET | ORAL | Status: DC

## 2012-08-31 MED ORDER — CLORAZEPATE DIPOTASSIUM 15 MG PO TABS: ORAL_TABLET | ORAL | Status: DC

## 2012-08-31 MED ORDER — IMIPRAMINE HCL 25 MG PO TABS: ORAL_TABLET | ORAL | Status: DC

## 2012-08-31 MED ORDER — FAMOTIDINE 20 MG PO TABS: ORAL_TABLET | ORAL | Status: DC

## 2012-08-31 MED ORDER — CYCLOBENZAPRINE HCL 10 MG PO TABS: ORAL_TABLET | ORAL | Status: DC

## 2012-08-31 NOTE — Progress Notes (Signed)
Subjective:    Patient ID: Lisa King, female    DOB: December 29, 1931, 79 y.o.   MRN: [id number]  HPI 79 y/o WF here for a follow up visit... she has mult med problems as noted...  Followed for general medical purposes w/ hx of COPD, Hyperchol, GERD/ Diverics/ Polyps, DJD/ FM/ Osteoporosis, and anxiety... she likes to take various supplements, and she is also followed by Rodena Medin for GI, and DrKendall for Ortho...  ~  September 05, 2008:  she reports 2 falls in Dec09/ Jan10 w/ eval DrKendall, ortho- scoliosis, arthritis, & Rx'd w/ muscle relaxer- improved... generally stable w/ c/o aches/ pains- uses Tylenol etc... we discussed smoking cessation, f/u CXR, diet for cholesterol, and incr exercise... we reviewed her recent labs.  ~  August 21, 2009:  Yearly ROV w/ mult somatic complaints> states "I had the flu from Dec thru March", still smoking but less than 1 cig/d now "I could quit if I wanted to"... she notes that she gets thrush "whenever I'm around someone who died"... Chol poorly controlled on diet alone, but still refuses med Rx... she has mod DJD/ FM/ Osteoporosis on a variety of supplements including black cherry concentrate ("it's good- it took my friend's pain away")... CXR w/o acute changes & recent lab work reviewed.  ~  August 21, 2010:  Yearly ROV & she states "I quit smoking but I cheat now & again";  She suffered a dog bite to left ankle 5/12 & still has some discomfort but wound is healed etc;  She notes some sinus drainage, but denies cough, phlegm, hemoptysis, CP, palpit, SOB, or edema;  We discussed her severe osteoporosis & the signif risk it imposes> she has refuses meds, endocrine consult, & hasn't f/u w/ GYN; we reviewed avail options & she has agreed to try ATELVIA 35mg /wk as this is supposed to be easier on the GI track...    CXR today is stable, NAD; and recent blood work looked good (see below)>>  ~  August 25, 2011:  Yearly ROV & Lisa King says she fell 2 wks ago (trying to kill a spider) w/  some chest wall discomfort & requests a muscle relaxer; she still smokes a cig "now & then" and has underlying COPD but declines regular inhalers,etc; fortunately she has avoided major upper resp infections & denies cough, sputum, ch in SOB/DOE etc; she gets a little exercise by walking...     We reviewed prob list, meds, xrays and labs> see below for updates>> CXR 7/13 showed heart at the upper lim of norm in size, lungs are clear/ overinflated c/w COPD, apical pleural thickening, NAD... LABS 7/13:  FLP- not at goals on diet alone w/ TChol 219 LDL=115;  Chems- wnl;  CBC- wnl;  TSH=1.38;  VitD=52    ~  August 31, 2012:  36yr ROV & Lisa King describes a good yr- just c/o some insomnia & arthritis pain... We reviewed the following medical problems during today's office visit >>     Hearing Loss> she had f/u appt w/ drBates ENT 3/14 & cerumen impactions removed...    COPD, Cig smoker> states her last cig was 02/15/12; still refuses breathing meds; states her breathing is good, notes better energy, & denies cough, sput, hemoptysis, SOB, etc; she is too sedentary & needs to incr exercise...    CHOL> on Fish Oil; FLP is improved on diet alone- TChol 191, TG 107, HDL 61, LDL 109     GI- GERD, Divertics, Polyps> on OTC H2Blocker like  Pepcid/Zantac, Miralax, Colace; she is due for a follow up colonoscopy this yr & we will refer...    DJD, FM> on Anaprox220Bid prn, Flexeril10Tid prn, BorageOil & "tart cherry advanced"; states that air conditioning makes her neck arthritis worse; advised heating pad & anti-inflamm meds...    Osteoporosis> on Calcium, MVI; BMD was -3.5 in spine in 2007 7 she has repeatedly refused f/u BMD, Bisphos med rx or alternnative med rx or endocrine consultation; only agrees to take Calcium & Vits despite the potential severe consequences...    Anxiety, Insomnia> on Tranxene15-1/2Tid [facility] changed her to the 15mg  tabs), Tofranil25-2Qhs...  We reviewed prob list, meds, xrays and labs> see below  for updates >>  LABS 7/14:  FLP- improved on diet alone;  Chems- wnl;  CBC- ok w/ Hg=11.7 MCV=93;  TSH=1.06;  VitD=61          Problem List:   HEARING LOSS (ICD-389.9) - hx bilat stapes surg for hearing problems by DrKraus yrs ago... states she can't have MRI's due to the stapes implants... now sees DrBates w/ hearing aides bilat... ~  3/14:  She had f/u DrBates> URI & cerumen impactions- removed + conservative therapy...   COPD (ICD-496) - states her breathing is OK and she denies SOB, and declines use of inhalers, ex= walking... She denies cough, sputum, hemoptysis, CP, palpit SOB, edema... she has declined to do PFT's in the past... ~  CXR 7/11 showed COPD, atherosclerotic calcif in Ao, osteopenic bones, compression of 2 mid thor vertebral bodies, NAD.Marland Kitchen. ~  CXR 7/12 showed hyperinflation, some apical pleural scarring, sm HH, mitral annular calcif, NAD.Marland Kitchen. ~  CXR 7/13 showed heart at the upper lim of norm in size, lungs are clear/ overinflated c/w COPD, apical pleural thickening, NAD...  CIGARETTE SMOKER (ICD-305.1) - states she's quit smoking now "but I cheat now & again"... she refuses Chantix stating that a friend tried it and died!!! We discussed other smoking cessation strategies... ~  7/13:  She states "I don't buy them anymore" but smokes now & then when she can get them...  HYPERCHOLESTEROLEMIA (ICD-272.0) - she has refused statin Rx in the past... tried Simvastatin 20mg /d 5/09 but states that she developed "muscle cramps, throat closing & insomnia" which resolved off this med- refuses to try other meds "I'm on Fish Oil & diet"... ~  FLP 11/07 showed Tchol 214, TG 130, HDL 51, LDL 125... refused statin, perfers diet alone... ~  FLP 5/09 showed TChol 228, TG 200, HDL 48, LDL 139... rec- start Simva20... pt DC'd it. ~  FLP 7/10 showed TChol 219, TG 171, HDL 54, LDL 127... refuses Fibrate or other lipid meds. ~  FLP 6/11 showed TChol 234, TG 166, HDL 55, LDL 133... discussed diet Rx. ~   FLP 6/12 showed TChol 196, TG 134, HDL 60, LDL 110... Improved on diet Rx! ~  FLP 7/13 showed TChol 219, TG 139, HDL 68, LDL 115... Offered low dose statin but she declines... ~  FLP 7/14 on diet alone showed TChol 191, TG 107, HDL 61, LDL 109... Improved on diet alone.  GERD (ICD-530.81)  -  on PEPCID 20mg Qd but she is adamant that FAMOTIDINE works much better than Pepcid... followed for GI by DrGessner...  DIVERTICULOSIS OF COLON (ICD-562.10) - she denies D, C, blood, ch in bowel habits... COLONIC POLYPS (ICD-211.3) - colonoscopy 7/06 showed several 4-56mm adenomatous polyps in a long redundant colon w/ divertics & hems... f/u colonoscopy 10/09 showed divertics, several polyps= adenomatous & hyperpl w/  f/u colon planned for 33yrs... HEMORRHOIDS (ICD-455.6) ~  2014> she is due for a f/u w/ DrGessner re: f/u colonoscopy   DEGENERATIVE JOINT DISEASE (ICD-715.90) - hx of leg pain w/ norm ABI's 2007... eval by Janett Billow for Ortho w/ "arthritis and scoliosis" treated w/ muscle relaxer and improved... she states she can't get MRI due to prev stapes surg... ~  7/11: she notes that black cherry concentrate is good "it took a freind's pain away"...  FIBROMYALGIA (ICD-729.1) - on IMIPRAMINE 25mg - 2tabs daily... ~  7/12:  She notes that her FM pain was exac by the dog bite 5/12...  OSTEOPOROSIS (ICD-733.00) - known severe osteoporosis w/ BMD 11/07 showing TScores -3.5 in spine & -2.6 in left fem neck... pt states intol to [facility] and refused other bisphosphonates or miacalcin... in addition she has refuses Endocrine referral to discuss further... "I take calcium and vitamins" but I carefully explained how that is not sufficient and that she is likely to suffer a serious compression fracture of her sp, or a fall could easily fracture her hip- she refuses Rx or further help... I have advised her to talk to her GYN about this as well. ~  7/10:  We discussed osteoporosis options again including bisphos, reclast,  miacalcin, forteo, & the new prolia- she is not interested in Rx or in Endocrine second opinion... ~  7/11:  Ditto... ~  7/12:  I reviewed all the above & offered the new ATELVIA 35mg /wk (fewer GI problems) and she agreed to try it, Rx written==> stopped on her own. ~  7/13:  We reviewed the situation w/ her bones, osteoporosis & her risks; she again refuses meds for this problem, and declines offers for subspec referral (Endocrine, etc)... ~  7/14:  She once again confirms her staunch refusal to consider f/u BMD, med rx of any kind for her osteoporosis, or 2nd opinion consultation...  ANXIETY (ICD-300.00)- she takes Tranxene7.5 at bedtime to help her sleep...  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 she takes ASA 81mg /d... had Tetanus shot 1997 & Pneumovax 2000... she refuses Flu shots! ~  Supplement meds include> Calc w/ D, MVI, Tums, FishOil, BorageOil, FlaxSeedOil, etc...   Past Surgical History  Procedure Laterality Date  . Tonsillectomy and adenoidectomy      as a child  . Stapes surgery      Dr Dorma Russell  . Cataract extraction, bilateral  2009    Outpatient Encounter Prescriptions as of 08/31/2012  Medication Sig Dispense Refill  . aspirin 81 MG tablet Take 2 tablets by mouth daily      . Borage, Borago officinalis, (BORAGE OIL) 1000 MG CAPS Take 2 capsules by mouth daily.       . calcium carbonate (TUMS - DOSED IN MG ELEMENTAL CALCIUM) 500 MG chewable tablet Chew 1 tablet by mouth daily.        . Calcium Carbonate-Vitamin D (CALTRATE 600+D) 600-400 MG-UNIT per tablet Take 1 tablet by mouth daily.        . clorazepate (TRANXENE) 15 MG tablet Take 1/2 tablet by mouth three times daily as needed  45 tablet  5  . cyclobenzaprine (FLEXERIL) 10 MG tablet Take 1/2 to 1 tablet by mouth three times daily as needed for muscle spasms  50 tablet  5  . docusate sodium (COLACE) 100 MG capsule Take 100 mg by mouth daily.       . famotidine (PEPCID) 20 MG tablet take 1 tablet by mouth twice  a day  30 tablet  11  . fish oil-omega-3 fatty acids 1000 MG capsule Take 2 g by mouth daily.        Marland Kitchen imipramine (TOFRANIL) 25 MG tablet take 2 tablets by mouth at bedtime  60 tablet  11  . Misc Natural Products (TART CHERRY ADVANCED) CAPS Take 1 capsule by mouth daily.      . Multiple Vitamins-Minerals (MULTIVITAMIN WITH MINERALS) tablet Take 1 tablet by mouth daily.        . naproxen sodium (ANAPROX) 220 MG tablet Take 220 mg by mouth 2 (two) times daily with a meal.        . polyethylene glycol (MIRALAX / GLYCOLAX) packet Take 17 g by mouth daily.         No facility-administered encounter medications on file as of 08/31/2012.    Allergies  Allergen Reactions  . Bisphosphonates     REACTION: [name]  . Simvastatin     REACTION: intolerant  . Sulfonamide Derivatives     REACTION: blisters/rash    Current Medications, Allergies, Past Medical History, Past Surgical History, Family History, and Social History were reviewed in [employer] record.   Review of Systems       See HPI - all other systems neg except as noted...       The patient complains of dyspnea on exertion, severe indigestion/heartburn, muscle weakness, and difficulty walking.  The patient denies anorexia, fever, weight loss, weight gain, vision loss, decreased hearing, hoarseness, chest pain, syncope, peripheral edema, prolonged cough, headaches, hemoptysis, abdominal pain, melena, hematochezia, hematuria, incontinence, suspicious skin lesions, transient blindness, depression, unusual weight change, abnormal bleeding, enlarged lymph nodes, and angioedema.     Objective:   Physical Exam    WD, WN, 79 y/o WF in NAD... GENERAL:  Alert & oriented; pleasant & cooperative... HEENT:  [place]/AT, EOM-wnl, PERRLA, EACs-clear, TMs-wnl, NOSE-clear, THROAT-clear & wnl. NECK:  Supple w/ fairROM; no JVD; normal carotid impulses w/o bruits; no thyromegaly or nodules palpated; no lymphadenopathy. CHEST:   decr BS bilat, clear to P & A; without wheezes/ rales/ or rhonchi heard... HEART:  Regular Rhythm; without murmurs/ rubs/ or gallops detected... ABDOMEN:  Soft & nontender; normal bowel sounds; no organomegaly or masses palpated... EXT: without deformities, mod arthritic changes; no varicose veins/ +venous insuffic/ no edema. NEURO:  CN's intact; no focal neuro deficits... DERM:  No lesions noted; prev nasal skin surg; no rash etc...  RADIOLOGY DATA:  Reviewed in the EPIC EMR & discussed w/ the patient...  LABORATORY DATA:  Reviewed in the EPIC EMR & discussed w/ the patient...   Assessment & Plan:    COPD>  States she's quit smoking but stiil smokes some (cheating) & advised to quit completely... Denies SOB etc but she is clearly too sedentary & asked to incr activity/ exercise...  CHOL>  On diet alone & recent FLP is improved & close tyo goals...   GI>  GERD/ Divertics/ Polyps>  Followed by Rodena Medin for GI; stable on Famotidine which she swears works better that [employer]; due for 69yr f/u colon in 2014...  DJD/ FM>  Stable overall; uses OTC anti-inflamm as needed + Imipramine Qhs...  Osteoporosis/ Compression fx>  She has 2 compression fx that are old, she is very high risk, we had another long conversation about her osteoporosis & the risk it imposes, we reviewed the avail therapies (again) but she declines all avail therapies & refuses second opinion consult etc...   Anxiety>  She uses  the Chlorazepate as needed...   Patient's Medications  New Prescriptions   No medications on file  Previous Medications   ASPIRIN 81 MG TABLET    Take 1  Tablet  by mouth daily   BORAGE, BORAGO OFFICINALIS, (BORAGE OIL) 1000 MG CAPS    Take 2 capsules by mouth daily.    CALCIUM CARBONATE (TUMS - DOSED IN MG ELEMENTAL CALCIUM) 500 MG CHEWABLE TABLET    Chew 1 tablet by mouth daily.     CALCIUM CARBONATE-VITAMIN D (CALTRATE 600+D) 600-400 MG-UNIT PER TABLET    Take 1 tablet by mouth daily.      DOCUSATE SODIUM (COLACE) 100 MG CAPSULE    Take 100 mg by mouth daily.    FISH OIL-OMEGA-3 FATTY ACIDS 1000 MG CAPSULE    Take 2 g by mouth daily.     MISC NATURAL PRODUCTS (TART CHERRY ADVANCED) CAPS    Take 1 capsule by mouth daily.   MULTIPLE VITAMINS-MINERALS (MULTIVITAMIN WITH MINERALS) TABLET    Take 1 tablet by mouth daily.     NAPROXEN SODIUM (ANAPROX) 220 MG TABLET    Take 220 mg by mouth 2 (two) times daily with a meal.     POLYETHYLENE GLYCOL (MIRALAX / GLYCOLAX) PACKET    Take 17 g by mouth daily.    Modified Medications   Modified Medication Previous Medication   CLORAZEPATE (TRANXENE) 3.75 MG TABLET clorazepate (TRANXENE) 3.75 MG tablet      Take a 1/2 to 1 tablet by mouth three times daily as needed    Take a 1/2 to 1 tablet by mouth three times daily as needed   CYCLOBENZAPRINE (FLEXERIL) 10 MG TABLET cyclobenzaprine (FLEXERIL) 10 MG tablet      Take 1/2 to 1 tablet by mouth three times daily as needed for muscle spasms    Take 1/2 to 1 tablet by mouth three times daily as needed for muscle spasms   FAMOTIDINE (PEPCID) 20 MG TABLET famotidine (PEPCID) 20 MG tablet      take 1 tablet by mouth daily    take 1 tablet by mouth twice a day   IMIPRAMINE (TOFRANIL) 25 MG TABLET imipramine (TOFRANIL) 25 MG tablet      take 2 tablets by mouth at bedtime    take 2 tablets by mouth at bedtime  Discontinued Medications   CLORAZEPATE (TRANXENE) 15 MG TABLET    Take 1/2 tablet by mouth three times daily as needed   CLORAZEPATE (TRANXENE) 7.5 MG TABLET    Take 1/2 to 1 tablet by mouth three times daily as needed

## 2012-08-31 NOTE — Patient Instructions (Addendum)
Today we updated your med list in our EPIC system...    Continue your current medications the same...    We refilled the meds you requested...  We reviewed your recent fasting blood work & gave you a copy for your records...  Call for any questions...  Let's plan a follow up visit in 37yr, sooner if needed for problems.Marland KitchenMarland Kitchen

## 2012-09-04 ENCOUNTER — Telehealth: Payer: Self-pay | Admitting: Pulmonary Disease

## 2012-09-04 NOTE — Telephone Encounter (Signed)
Per SN---  Is generic chlorazepate 7.5 mg  Avail?  Is it avail elsewhere?  Is this on national backorder?  i have called [facility] [address] and they stated that they only have a few on hand.  This pharmacist stated that he is not aware of a backorder .  SN please advise if we can do the clorazepate 15 mg.  thanks

## 2012-09-06 NOTE — Telephone Encounter (Signed)
Called and spoke with pt and she stated that she is taking the 7.5 mg  Of the chlorazepate.  She stated that she still has some of the 7.5 mg left over and that the new rx she picked up was the 15 mg.  She currently is taking 1/4 of the tablet of the 7.5mg .  I spoke with SN and ok to send in the chlorazepate 3.75mg    1/2 to 1 tablet by mouth three times daily.  New rx has been sent in to the pharmacy.

## 2012-09-07 MED ORDER — CLORAZEPATE DIPOTASSIUM 3.75 MG PO TABS: ORAL_TABLET | ORAL | Status: DC

## 2012-09-07 NOTE — Telephone Encounter (Signed)
i called and spoke with the pharmacy yesterday and they are going to call me back once they find out if they can get the clorazepate 3.75mg  tablets in for the pt.  Will hold on to this message until the call back

## 2012-09-07 NOTE — Telephone Encounter (Signed)
Please advise if this encounter can be closed.

## 2012-09-07 NOTE — Telephone Encounter (Signed)
[facility] called me back and she stated that they can order the clorazepate 3.75 mg.  i gave this as a verbal order and they will hold this rx for the pt until she needs this medication.  Called and spoke with pt and she stated that she is aware since the pharmacy has already called her.  Nothing further is needed.

## 2012-10-04 ENCOUNTER — Other Ambulatory Visit: Payer: Self-pay | Admitting: Pulmonary Disease

## 2012-10-04 DIAGNOSIS — D126 Benign neoplasm of colon, unspecified: Secondary | ICD-10-CM

## 2012-10-05 ENCOUNTER — Telehealth: Payer: Self-pay | Admitting: Pulmonary Disease

## 2012-10-05 NOTE — Telephone Encounter (Signed)
i called and spoke with pt and she stated that Lisa King had called and lmom for her to call back to schedule an appt.  [name], does the pt need another appt?

## 2012-10-05 NOTE — Telephone Encounter (Signed)
I do not recall speaking to this patient. Nothing is documented in her chart.

## 2012-10-05 NOTE — Telephone Encounter (Signed)
i have called the pt back and advised her that no one from our office called her and she is aware to disregard that message.

## 2012-10-11 DIAGNOSIS — D1801 Hemangioma of skin and subcutaneous tissue: Secondary | ICD-10-CM | POA: Diagnosis not present

## 2012-10-11 DIAGNOSIS — L821 Other seborrheic keratosis: Secondary | ICD-10-CM | POA: Diagnosis not present

## 2012-10-11 DIAGNOSIS — Q828 Other specified congenital malformations of skin: Secondary | ICD-10-CM | POA: Diagnosis not present

## 2012-10-11 DIAGNOSIS — L578 Other skin changes due to chronic exposure to nonionizing radiation: Secondary | ICD-10-CM | POA: Diagnosis not present

## 2012-10-11 DIAGNOSIS — L819 Disorder of pigmentation, unspecified: Secondary | ICD-10-CM | POA: Diagnosis not present

## 2012-10-11 DIAGNOSIS — L570 Actinic keratosis: Secondary | ICD-10-CM | POA: Diagnosis not present

## 2012-12-28 DIAGNOSIS — H1030 Unspecified acute conjunctivitis, unspecified eye: Secondary | ICD-10-CM | POA: Diagnosis not present

## 2013-01-16 ENCOUNTER — Encounter: Payer: Self-pay | Admitting: Internal Medicine

## 2013-03-22 ENCOUNTER — Other Ambulatory Visit: Payer: Self-pay | Admitting: Pulmonary Disease

## 2013-03-27 ENCOUNTER — Other Ambulatory Visit: Payer: Self-pay | Admitting: Pulmonary Disease

## 2013-05-11 ENCOUNTER — Encounter: Payer: Self-pay | Admitting: Internal Medicine

## 2013-06-12 DIAGNOSIS — H52209 Unspecified astigmatism, unspecified eye: Secondary | ICD-10-CM | POA: Diagnosis not present

## 2013-06-12 DIAGNOSIS — Z961 Presence of intraocular lens: Secondary | ICD-10-CM | POA: Diagnosis not present

## 2013-08-23 ENCOUNTER — Inpatient Hospital Stay (HOSPITAL_COMMUNITY)
Admission: EM | Admit: 2013-08-23 | Discharge: 2013-08-25 | DRG: 378 | Disposition: A | Discharge status: Home / Self Care | Payer: Medicare Other | Attending: Internal Medicine | Admitting: Internal Medicine

## 2013-08-23 ENCOUNTER — Encounter (HOSPITAL_COMMUNITY): Payer: Self-pay | Admitting: Emergency Medicine

## 2013-08-23 DIAGNOSIS — Z791 Long term (current) use of non-steroidal anti-inflammatories (NSAID): Secondary | ICD-10-CM | POA: Diagnosis not present

## 2013-08-23 DIAGNOSIS — N390 Urinary tract infection, site not specified: Secondary | ICD-10-CM | POA: Diagnosis present

## 2013-08-23 DIAGNOSIS — D62 Acute posthemorrhagic anemia: Secondary | ICD-10-CM | POA: Diagnosis present

## 2013-08-23 DIAGNOSIS — Z0000 Encounter for general adult medical examination without abnormal findings: Secondary | ICD-10-CM

## 2013-08-23 DIAGNOSIS — Z880 Allergy status to penicillin: Secondary | ICD-10-CM

## 2013-08-23 DIAGNOSIS — Z87891 Personal history of nicotine dependence: Secondary | ICD-10-CM

## 2013-08-23 DIAGNOSIS — Z860100 Personal history of colon polyps, unspecified: Secondary | ICD-10-CM

## 2013-08-23 DIAGNOSIS — E7800 Pure hypercholesterolemia, unspecified: Secondary | ICD-10-CM | POA: Diagnosis present

## 2013-08-23 DIAGNOSIS — K219 Gastro-esophageal reflux disease without esophagitis: Secondary | ICD-10-CM | POA: Diagnosis present

## 2013-08-23 DIAGNOSIS — K649 Unspecified hemorrhoids: Secondary | ICD-10-CM | POA: Diagnosis present

## 2013-08-23 DIAGNOSIS — M1990 Unspecified osteoarthritis, unspecified site: Secondary | ICD-10-CM | POA: Diagnosis present

## 2013-08-23 DIAGNOSIS — F411 Generalized anxiety disorder: Secondary | ICD-10-CM | POA: Diagnosis present

## 2013-08-23 DIAGNOSIS — F17200 Nicotine dependence, unspecified, uncomplicated: Secondary | ICD-10-CM

## 2013-08-23 DIAGNOSIS — IMO0001 Reserved for inherently not codable concepts without codable children: Secondary | ICD-10-CM | POA: Diagnosis present

## 2013-08-23 DIAGNOSIS — K922 Gastrointestinal hemorrhage, unspecified: Secondary | ICD-10-CM

## 2013-08-23 DIAGNOSIS — J449 Chronic obstructive pulmonary disease, unspecified: Secondary | ICD-10-CM | POA: Diagnosis present

## 2013-08-23 DIAGNOSIS — Z9849 Cataract extraction status, unspecified eye: Secondary | ICD-10-CM

## 2013-08-23 DIAGNOSIS — H9190 Unspecified hearing loss, unspecified ear: Secondary | ICD-10-CM | POA: Diagnosis present

## 2013-08-23 DIAGNOSIS — Z836 Family history of other diseases of the respiratory system: Secondary | ICD-10-CM

## 2013-08-23 DIAGNOSIS — K5731 Diverticulosis of large intestine without perforation or abscess with bleeding: Secondary | ICD-10-CM | POA: Diagnosis present

## 2013-08-23 DIAGNOSIS — K5730 Diverticulosis of large intestine without perforation or abscess without bleeding: Secondary | ICD-10-CM

## 2013-08-23 DIAGNOSIS — K5900 Constipation, unspecified: Secondary | ICD-10-CM | POA: Diagnosis present

## 2013-08-23 DIAGNOSIS — M810 Age-related osteoporosis without current pathological fracture: Secondary | ICD-10-CM | POA: Diagnosis present

## 2013-08-23 DIAGNOSIS — J4489 Other specified chronic obstructive pulmonary disease: Secondary | ICD-10-CM | POA: Diagnosis present

## 2013-08-23 DIAGNOSIS — Z888 Allergy status to other drugs, medicaments and biological substances status: Secondary | ICD-10-CM | POA: Diagnosis not present

## 2013-08-23 DIAGNOSIS — K625 Hemorrhage of anus and rectum: Secondary | ICD-10-CM | POA: Diagnosis not present

## 2013-08-23 DIAGNOSIS — Z8249 Family history of ischemic heart disease and other diseases of the circulatory system: Secondary | ICD-10-CM | POA: Diagnosis not present

## 2013-08-23 DIAGNOSIS — D126 Benign neoplasm of colon, unspecified: Secondary | ICD-10-CM | POA: Diagnosis present

## 2013-08-23 DIAGNOSIS — Z882 Allergy status to sulfonamides status: Secondary | ICD-10-CM

## 2013-08-23 DIAGNOSIS — Z79899 Other long term (current) drug therapy: Secondary | ICD-10-CM | POA: Diagnosis not present

## 2013-08-23 DIAGNOSIS — N3000 Acute cystitis without hematuria: Secondary | ICD-10-CM

## 2013-08-23 DIAGNOSIS — Z7982 Long term (current) use of aspirin: Secondary | ICD-10-CM | POA: Diagnosis not present

## 2013-08-23 DIAGNOSIS — Z8261 Family history of arthritis: Secondary | ICD-10-CM | POA: Diagnosis not present

## 2013-08-23 DIAGNOSIS — R6889 Other general symptoms and signs: Secondary | ICD-10-CM | POA: Diagnosis not present

## 2013-08-23 DIAGNOSIS — R109 Unspecified abdominal pain: Secondary | ICD-10-CM | POA: Diagnosis not present

## 2013-08-23 HISTORY — DX: Acute posthemorrhagic anemia: D62

## 2013-08-23 LAB — URINE MICROSCOPIC-ADD ON

## 2013-08-23 LAB — BASIC METABOLIC PANEL
Anion gap: 11 (ref 5–15)
BUN: 13 mg/dL (ref 6–23)
CHLORIDE: 100 meq/L (ref 96–112)
CO2: 25 meq/L (ref 19–32)
CREATININE: 0.79 mg/dL (ref 0.50–1.10)
Calcium: 9.1 mg/dL (ref 8.4–10.5)
GFR calc Af Amer: 89 mL/min — ABNORMAL LOW (ref >90)
GFR calc non Af Amer: 77 mL/min — ABNORMAL LOW (ref >90)
Glucose, Bld: 100 mg/dL — ABNORMAL HIGH (ref 70–99)
Potassium: 4.3 mEq/L (ref 3.7–5.3)
SODIUM: 136 meq/L — AB (ref 137–147)

## 2013-08-23 LAB — HEMOGLOBIN AND HEMATOCRIT, BLOOD
HCT: 28.7 % — ABNORMAL LOW (ref 36.0–46.0)
HCT: 26.2 % — ABNORMAL LOW (ref 36.0–46.0)
Hemoglobin: 10 g/dL — ABNORMAL LOW (ref 12.0–15.0)
Hemoglobin: 8.9 g/dL — ABNORMAL LOW (ref 12.0–15.0)

## 2013-08-23 LAB — HEPATIC FUNCTION PANEL
ALK PHOS: 86 U/L (ref 39–117)
ALT: 17 U/L (ref 0–35)
AST: 29 U/L (ref 0–37)
Albumin: 3.9 g/dL (ref 3.5–5.2)
Bilirubin, Direct: <0.2 mg/dL (ref 0.0–0.3)
TOTAL PROTEIN: 6.4 g/dL (ref 6.0–8.3)
Total Bilirubin: 0.3 mg/dL (ref 0.3–1.2)

## 2013-08-23 LAB — APTT: aPTT: 30 seconds (ref 24–37)

## 2013-08-23 LAB — URINALYSIS, ROUTINE W REFLEX MICROSCOPIC
BILIRUBIN URINE: NEGATIVE
Glucose, UA: NEGATIVE mg/dL
Ketones, ur: NEGATIVE mg/dL
NITRITE: NEGATIVE
PROTEIN: 30 mg/dL — AB
SPECIFIC GRAVITY, URINE: 1.013 (ref 1.005–1.030)
Urobilinogen, UA: 0.2 mg/dL (ref 0.0–1.0)
pH: 8 (ref 5.0–8.0)

## 2013-08-23 LAB — CBC WITH DIFFERENTIAL/PLATELET
BASOS PCT: 1 % (ref 0–1)
Basophils Absolute: 0 10*3/uL (ref 0.0–0.1)
EOS ABS: 0.1 10*3/uL (ref 0.0–0.7)
EOS PCT: 3 % (ref 0–5)
HCT: 30.5 % — ABNORMAL LOW (ref 36.0–46.0)
HEMOGLOBIN: 10.5 g/dL — AB (ref 12.0–15.0)
LYMPHS ABS: 1.2 10*3/uL (ref 0.7–4.0)
Lymphocytes Relative: 28 % (ref 12–46)
MCH: 30.8 pg (ref 26.0–34.0)
MCHC: 34.4 g/dL (ref 30.0–36.0)
MCV: 89.4 fL (ref 78.0–100.0)
MONO ABS: 0.3 10*3/uL (ref 0.1–1.0)
MONOS PCT: 6 % (ref 3–12)
Neutro Abs: 2.6 10*3/uL (ref 1.7–7.7)
Neutrophils Relative %: 62 % (ref 43–77)
Platelets: 214 10*3/uL (ref 150–400)
RBC: 3.41 MIL/uL — AB (ref 3.87–5.11)
RDW: 13.2 % (ref 11.5–15.5)
WBC: 4.3 10*3/uL (ref 4.0–10.5)

## 2013-08-23 LAB — SAMPLE TO BLOOD BANK

## 2013-08-23 LAB — POC OCCULT BLOOD, ED: FECAL OCCULT BLD: POSITIVE — AB

## 2013-08-23 LAB — LACTIC ACID, PLASMA: Lactic Acid, Venous: 0.7 mmol/L (ref 0.5–2.2)

## 2013-08-23 LAB — PROTIME-INR
INR: 0.9 (ref 0.00–1.49)
Prothrombin Time: 12.2 seconds (ref 11.6–15.2)

## 2013-08-23 MED ORDER — CALCIUM CARBONATE ANTACID 500 MG PO CHEW
1.0000 | CHEWABLE_TABLET | Freq: Every day | ORAL | Status: DC
  Filled 2013-08-23: qty 1

## 2013-08-23 MED ORDER — CLORAZEPATE DIPOTASSIUM 7.5 MG PO TABS
7.5000 mg | ORAL_TABLET | Freq: Three times a day (TID) | ORAL | Status: DC | PRN
  Administered 2013-08-23: 7.5 mg via ORAL
  Filled 2013-08-23: qty 1

## 2013-08-23 MED ORDER — ONDANSETRON HCL 4 MG/2ML IJ SOLN
4.0000 mg | Freq: Four times a day (QID) | INTRAMUSCULAR | Status: DC | PRN
  Administered 2013-08-23: 4 mg via INTRAVENOUS
  Filled 2013-08-23: qty 2

## 2013-08-23 MED ORDER — CYCLOBENZAPRINE HCL 5 MG PO TABS
7.5000 mg | ORAL_TABLET | Freq: Three times a day (TID) | ORAL | Status: DC | PRN
  Filled 2013-08-23: qty 1.5

## 2013-08-23 MED ORDER — POLYETHYLENE GLYCOL 3350 17 G PO PACK
17.0000 g | PACK | Freq: Every day | ORAL | Status: DC
  Filled 2013-08-23 (×3): qty 1

## 2013-08-23 MED ORDER — SODIUM CHLORIDE 0.9 % IJ SOLN
3.0000 mL | Freq: Two times a day (BID) | INTRAMUSCULAR | Status: DC
  Administered 2013-08-23: 3 mL via INTRAVENOUS

## 2013-08-23 MED ORDER — DOCUSATE SODIUM 100 MG PO CAPS
100.0000 mg | ORAL_CAPSULE | Freq: Every day | ORAL | Status: DC
  Administered 2013-08-24 – 2013-08-25 (×2): 100 mg via ORAL
  Filled 2013-08-23 (×2): qty 1

## 2013-08-23 MED ORDER — SODIUM CHLORIDE 0.9 % IV SOLN
INTRAVENOUS | Status: DC
  Administered 2013-08-23 – 2013-08-25 (×3): via INTRAVENOUS

## 2013-08-23 MED ORDER — PANTOPRAZOLE SODIUM 40 MG PO TBEC
40.0000 mg | DELAYED_RELEASE_TABLET | Freq: Two times a day (BID) | ORAL | Status: DC
  Administered 2013-08-23 – 2013-08-25 (×4): 40 mg via ORAL
  Filled 2013-08-23 (×5): qty 1

## 2013-08-23 MED ORDER — IMIPRAMINE HCL 50 MG PO TABS
50.0000 mg | ORAL_TABLET | Freq: Every day | ORAL | Status: DC
  Administered 2013-08-23 – 2013-08-24 (×2): 50 mg via ORAL
  Filled 2013-08-23 (×3): qty 1

## 2013-08-23 MED ORDER — SODIUM CHLORIDE 0.9 % IV SOLN
80.0000 mg | Freq: Once | INTRAVENOUS | Status: AC
  Administered 2013-08-23: 80 mg via INTRAVENOUS
  Filled 2013-08-23: qty 80

## 2013-08-23 MED ORDER — PANTOPRAZOLE SODIUM 40 MG IV SOLR
40.0000 mg | Freq: Once | INTRAVENOUS | Status: AC
  Administered 2013-08-23: 40 mg via INTRAVENOUS
  Filled 2013-08-23: qty 40

## 2013-08-23 MED ORDER — CALCIUM CARBONATE ANTACID 500 MG PO CHEW
500.0000 mg | CHEWABLE_TABLET | Freq: Every day | ORAL | Status: DC
  Administered 2013-08-24 – 2013-08-25 (×2): 500 mg via ORAL
  Filled 2013-08-23 (×2): qty 1

## 2013-08-23 MED ORDER — ONDANSETRON HCL 4 MG PO TABS: 4.0000 mg | ORAL_TABLET | Freq: Four times a day (QID) | ORAL | Status: DC | PRN

## 2013-08-23 MED ORDER — SODIUM CHLORIDE 0.9 % IV SOLN
Freq: Once | INTRAVENOUS | Status: AC
  Administered 2013-08-23: 11:00:00 via INTRAVENOUS

## 2013-08-23 NOTE — ED Notes (Signed)
Per EMS pt coming from home with c/o rectal bleeding, EMS reports red blood stains seen on pt's bedding, pt reports hx of hemorrhoids and constipation, LBM yesterday.

## 2013-08-23 NOTE — Consult Note (Signed)
Referring Provider: ER Primary Care Physician:  Noralee Space, MD Primary Gastroenterologist:  Dr.Gessner  Reason for Consultation:  GI Bleed  HPI: Lisa King is a 80 y.o. female previous colonoscopies with history of sigmoid diverticulosis, external hemorrhoids and colon polyps. She last had colonoscopy in 2009 and had a total of 9 polyps removed at that time 2 were greater than 5 mm and path on these was consistent with tubular adenomas, the other polyps were hyperplastic. Patient comes to the emergency room this morning after being awakened at 5 AM noticing that her bed felt wet, and when she turned on the light she realized that there was a puddle of blood in the bed. She went to the bathroom and realized that the blood was coming from her rectum. She describes the blood as dark red. After that she had 3-4 episodes of urge for bowel movement and then spurts of dark red blood in the commode which she says filled the commode. She has had no associated abdominal pain or cramping. No associated nausea vomiting lightheadedness diaphoresis et Ronney Asters. She has not had any previous GI bleeding. She is hemodynamically stable in the emergency room and hemoglobin is 10.5 down from her baseline of 11.7-12. She does take 1 baby aspirin daily no regular NSAIDs. She says she was feeling in her usual state of health earlier this week but feels with chronic constipation which she has had for many years. She takes MiraLax on a daily basis but still remains constipated most days and says she had strained yesterday for bowel movement. Other medical problems include COPD, GERD anxiety and osteoarthritis.   Past Medical History  Diagnosis Date  . Hearing loss   . COPD (chronic obstructive pulmonary disease)   . Cigarette smoker   . Hypercholesterolemia   . GERD (gastroesophageal reflux disease)   . Diverticulosis of colon   . Colon polyps   . Hemorrhoids   . DJD (degenerative joint disease)   . Fibromyalgia    . Osteoporosis   . Anxiety     Past Surgical History  Procedure Laterality Date  . Tonsillectomy and adenoidectomy      as a child  . Stapes surgery      Dr Thornell Mule  . Cataract extraction, bilateral  2009    Prior to Admission medications   Medication Sig Start Date End Date Taking? Authorizing Provider  famotidine (PEPCID) 20 MG tablet take 1 tablet by mouth daily 08/31/12  Yes Noralee Space, MD  aspirin 81 MG tablet Take 1  Tablet  by mouth daily    Historical Provider, MD  Borage, Borago officinalis, (BORAGE OIL) 1000 MG CAPS Take 2 capsules by mouth daily.     Historical Provider, MD  calcium carbonate (TUMS - DOSED IN MG ELEMENTAL CALCIUM) 500 MG chewable tablet Chew 1 tablet by mouth daily.      Historical Provider, MD  Calcium Carbonate-Vitamin D (CALTRATE 600+D) 600-400 MG-UNIT per tablet Take 1 tablet by mouth daily.      Historical Provider, MD  clorazepate (TRANXENE) 15 MG tablet TAKE ONE-HALF TABLET BY MOUTH THREE TIMES DAILY AS NEEDED    Noralee Space, MD  clorazepate (TRANXENE) 3.75 MG tablet Take a 1/2 to 1 tablet by mouth three times daily as needed 09/07/12   Noralee Space, MD  cyclobenzaprine (FLEXERIL) 10 MG tablet Take 1/2 to 1 tablet by mouth three times daily as needed for muscle spasms 08/31/12   Noralee Space, MD  docusate  sodium (COLACE) 100 MG capsule Take 100 mg by mouth daily.     Historical Provider, MD  fish oil-omega-3 fatty acids 1000 MG capsule Take 2 g by mouth daily.      Historical Provider, MD  imipramine (TOFRANIL) 25 MG tablet take 2 tablets by mouth at bedtime 08/31/12   Noralee Space, MD  Misc Natural Products [facility] ADVANCED) CAPS Take 1 capsule by mouth daily.    Historical Provider, MD  Multiple Vitamins-Minerals (MULTIVITAMIN WITH MINERALS) tablet Take 1 tablet by mouth daily.      Historical Provider, MD  naproxen sodium (ANAPROX) 220 MG tablet Take 220 mg by mouth 2 (two) times daily with a meal.      Historical Provider, MD  polyethylene  glycol (MIRALAX / GLYCOLAX) packet Take 17 g by mouth daily.      Historical Provider, MD    Current Facility-Administered Medications  Medication Dose Route Frequency Provider Last Rate Last Dose  . 0.9 %  sodium chloride infusion   Intravenous Once [employer], PA-C       Current Outpatient Prescriptions  Medication Sig Dispense Refill  . famotidine (PEPCID) 20 MG tablet take 1 tablet by mouth daily  30 tablet  11  . aspirin 81 MG tablet Take 1  Tablet  by mouth daily      . Borage, Borago officinalis, (BORAGE OIL) 1000 MG CAPS Take 2 capsules by mouth daily.       . calcium carbonate (TUMS - DOSED IN MG ELEMENTAL CALCIUM) 500 MG chewable tablet Chew 1 tablet by mouth daily.        . Calcium Carbonate-Vitamin D (CALTRATE 600+D) 600-400 MG-UNIT per tablet Take 1 tablet by mouth daily.        . clorazepate (TRANXENE) 15 MG tablet TAKE ONE-HALF TABLET BY MOUTH THREE TIMES DAILY AS NEEDED  45 tablet  2  . clorazepate (TRANXENE) 3.75 MG tablet Take a 1/2 to 1 tablet by mouth three times daily as needed  90 tablet  5  . cyclobenzaprine (FLEXERIL) 10 MG tablet Take 1/2 to 1 tablet by mouth three times daily as needed for muscle spasms  25 tablet  5  . docusate sodium (COLACE) 100 MG capsule Take 100 mg by mouth daily.       . fish oil-omega-3 fatty acids 1000 MG capsule Take 2 g by mouth daily.        Marland Kitchen imipramine (TOFRANIL) 25 MG tablet take 2 tablets by mouth at bedtime  60 tablet  11  . Misc Natural Products ([employer] ADVANCED) CAPS Take 1 capsule by mouth daily.      . Multiple Vitamins-Minerals (MULTIVITAMIN WITH MINERALS) tablet Take 1 tablet by mouth daily.        . naproxen sodium (ANAPROX) 220 MG tablet Take 220 mg by mouth 2 (two) times daily with a meal.        . polyethylene glycol (MIRALAX / GLYCOLAX) packet Take 17 g by mouth daily.          Allergies as of 08/23/2013 - Review Complete 08/23/2013  Allergen Reaction Noted  . Penicillins Anaphylaxis 08/23/2013  .  Bisphosphonates Other (See Comments)   . Simvastatin    . Sulfonamide derivatives Rash and Other (See Comments)     Family History  Problem Relation Age of Onset  . Heart disease Father   . COPD Father   . Hypertension Mother   . Arthritis Mother     History  Social History  . Marital Status: Married    Spouse Name: N/A    Number of Children: N/A  . Years of Education: N/A   Occupational History  . retired   . works some weekends at [employer] Topics  . Smoking status: Former Smoker    Types: Cigarettes    Quit date: 02/15/2010  . Smokeless tobacco: Never Used  . Alcohol Use: Yes     Comment: occasional glass of wine  . Drug Use: No  . Sexual Activity: Not on file   Other Topics Concern  . Not on file   Social History Narrative   Pt works some weekends at [employer]   Caffeine use - daily coffee in the mornings   Patient gets regular exercise > tries to walk daily for exercise    Review of Systems: Pertinent positive and negative review of systems were noted in the above HPI section.  All other review of systems was otherwise negative. Physical Exam: Vital signs in last 24 hours: Temp:  [98.3 F (36.8 C)] 98.3 F (36.8 C) (07/09 0750) Pulse Rate:  [82-87] 87 (07/09 0916) Resp:  [17-18] 18 (07/09 0916) BP: (155-191)/(65-73) 155/65 mmHg (07/09 0916) SpO2:  [97 %-99 %] 99 % (07/09 0916)   General:   Alert,  Well-developed,elderlyWF, well-nourished, pleasant and cooperative in NAD Head:  Normocephalic and atraumatic. Eyes:  Sclera clear, no icterus.   Conjunctiva pale Ears:  Normal auditory acuity. Nose:  No deformity, discharge,  or lesions. Mouth:  No deformity or lesions.   Neck:  Supple; no masses or thyromegaly. Lungs:  Clear throughout to auscultation.   No wheezes, crackles, or rhonchi. Heart:  Regular rate and rhythm; no murmurs, clicks, rubs,  or gallops. Abdomen:  Soft,nontender, BS active,nonpalp mass  or hsm.   Rectal:  Deferred - pt has dark stool,liquid with dark blood oozing from rectum Msk:  Symmetrical without gross deformities. . Pulses:  Normal pulses noted. Extremities:  Without clubbing or edema. Neurologic:  Alert and  oriented x4;  grossly normal neurologically. Skin:  Intact without significant lesions or rashes.. Psych:  Alert and cooperative. Normal mood and affect.  Intake/Output from previous day:   Intake/Output this shift:    Lab Results:  Recent Labs  08/23/13 0900  WBC 4.3  HGB 10.5*  HCT 30.5*  PLT 214   BMET  Recent Labs  08/23/13 0811  NA 136*  K 4.3  CL 100  CO2 25  GLUCOSE 100*  BUN 13  CREATININE 0.79  CALCIUM 9.1   LFT  Recent Labs  08/23/13 0811  PROT 6.4  ALBUMIN 3.9  AST 29  ALT 17  ALKPHOS 86  BILITOT 0.3  BILIDIR <0.2  IBILI NOT CALCULATED   PT/INR  Recent Labs  08/23/13 0811  LABPROT 12.2  INR 0.90     IMPRESSION:  #6  80 year old female with acute lower GI bleed probably diverticular. She is hemodynamically stable but still having bleeding. #2 anemia secondary to acute blood loss #3 history of adenomatous colon polyps overdue for followup #4 COPD #5 osteoarthritis #6 osteoporosis  PLAN: #1 close observation for now with serial hemoglobins and transfusion as indicated for hemoglobin 8 or less #2 clear liquid diet #3 hold aspirin #4 patient will eventually need followup colonoscopy, would prefer not to do a colonoscopy for removal of polyps in the face of GI bleeding however if she continues to bleed, she may require colonoscopy  for hemostasis. We will follow with you.     Amy Esterwood  08/23/2013, 10:02 AM     ________________________________________________________________________  Velora Heckler GI MD note:  I personally examined the patient, reviewed the data and agree with the assessment and plan described above. Likely diverticular bleeding.  Will observe next 12-24 hours to see if the bleeding is  going to stop on its own or will require therapy to stop (colonsocopy).  For now, transfuse as needed. 80-90% of time lower GI bleeding will resolve without need for intervention.   Owens Loffler, MD [facility] Gastroenterology Pager [phone]

## 2013-08-23 NOTE — ED Notes (Signed)
Lab called stating CBC clotted and needs to be recollected.

## 2013-08-23 NOTE — ED Notes (Signed)
Pt called out asking for help with bathroom, blood clots noted on pt's underwear, blood oozing out of rectum after pt was cleaned up. Elmyra Ricks PA notified.

## 2013-08-23 NOTE — ED Provider Notes (Signed)
CSN: [id number]     Arrival date & time 08/23/13  0630 History   First MD Initiated Contact with Patient 08/23/13 [phone]     Chief Complaint  Patient presents with  . Rectal Bleeding     (Consider location/radiation/quality/duration/timing/severity/associated sxs/prior Treatment) HPI  Lisa King is a 80 y.o. female with multiple comorbidities past medical history significant for diverticulosis, colonic polyps and hemorrhoids (followed by gastroenterologist Dr. Carlean Purl) c/o bright red blood per rectum which she noticed this morning at 5 AM. Patient states she woke up in "a puddle." Patient has not had a bowel movement today but states she is still bleeding. She is using a maxi-pad. No prior GI bleeds. Patient denies any anticoagulants, takes a daily low dose aspirin and daily 220 mg Aleve for pain control. No prior or active history of alcohol abuse or liver insufficiancy. Patient denies abdominal pain, chest pain, shortness of breath, palpitations, fatigue, melena, nausea vomiting, hematemesis. States she is chronically constipated she takes MiraLAX. Reports last bowel movement was yesterday: large volume, firm consistency. On review of systems, patient also notes a mild headache onset yesterday and feeling "gassy." Last colonoscopy was over 10 years she was due for a colonoscopy in the last year but she has not completed it.  PCP Lenna Gilford  Past Medical History  Diagnosis Date  . Hearing loss   . COPD (chronic obstructive pulmonary disease)   . Cigarette smoker   . Hypercholesterolemia   . GERD (gastroesophageal reflux disease)   . Diverticulosis of colon   . Colon polyps   . Hemorrhoids   . DJD (degenerative joint disease)   . Fibromyalgia   . Osteoporosis   . Anxiety    Past Surgical History  Procedure Laterality Date  . Tonsillectomy and adenoidectomy      as a child  . Stapes surgery      Dr Thornell Mule  . Cataract extraction, bilateral  2009   Family History  Problem Relation Age  of Onset  . Heart disease Father   . COPD Father   . Hypertension Mother   . Arthritis Mother    History  Substance Use Topics  . Smoking status: Former Smoker    Types: Cigarettes    Quit date: 02/15/2010  . Smokeless tobacco: Never Used  . Alcohol Use: Yes     Comment: occasional glass of wine   OB History   Grav Para Term Preterm Abortions TAB SAB Ect Mult Living                 Review of Systems  10 systems reviewed and found to be negative, except as noted in the HPI.   Allergies  Penicillins; Bisphosphonates; Simvastatin; and Sulfonamide derivatives  Home Medications   Prior to Admission medications   Medication Sig Start Date End Date Taking? Authorizing Provider  famotidine (PEPCID) 20 MG tablet take 1 tablet by mouth daily 08/31/12  Yes Noralee Space, MD  aspirin 81 MG tablet Take 1  Tablet  by mouth daily    Historical Provider, MD  Borage, Borago officinalis, (BORAGE OIL) 1000 MG CAPS Take 2 capsules by mouth daily.     Historical Provider, MD  calcium carbonate (TUMS - DOSED IN MG ELEMENTAL CALCIUM) 500 MG chewable tablet Chew 1 tablet by mouth daily.      Historical Provider, MD  Calcium Carbonate-Vitamin D (CALTRATE 600+D) 600-400 MG-UNIT per tablet Take 1 tablet by mouth daily.      Historical Provider, MD  clorazepate (TRANXENE) 15 MG tablet TAKE ONE-HALF TABLET BY MOUTH THREE TIMES DAILY AS NEEDED    Noralee Space, MD  clorazepate (TRANXENE) 3.75 MG tablet Take a 1/2 to 1 tablet by mouth three times daily as needed 09/07/12   Noralee Space, MD  cyclobenzaprine (FLEXERIL) 10 MG tablet Take 1/2 to 1 tablet by mouth three times daily as needed for muscle spasms 08/31/12   Noralee Space, MD  docusate sodium (COLACE) 100 MG capsule Take 100 mg by mouth daily.     Historical Provider, MD  fish oil-omega-3 fatty acids 1000 MG capsule Take 2 g by mouth daily.      Historical Provider, MD  imipramine (TOFRANIL) 25 MG tablet take 2 tablets by mouth at bedtime 08/31/12    Noralee Space, MD  Misc Natural Products [facility] ADVANCED) CAPS Take 1 capsule by mouth daily.    Historical Provider, MD  Multiple Vitamins-Minerals (MULTIVITAMIN WITH MINERALS) tablet Take 1 tablet by mouth daily.      Historical Provider, MD  naproxen sodium (ANAPROX) 220 MG tablet Take 220 mg by mouth 2 (two) times daily with a meal.      Historical Provider, MD  polyethylene glycol (MIRALAX / GLYCOLAX) packet Take 17 g by mouth daily.      Historical Provider, MD   BP 143/89  Pulse 83  Temp(Src) 98.3 F (36.8 C) (Oral)  Resp 18  SpO2 97% Physical Exam  Nursing note and vitals reviewed. Constitutional: She is oriented to person, place, and time. She appears well-developed and well-nourished. No distress.  HENT:  Head: Normocephalic.  Mouth/Throat: Oropharynx is clear and moist.  Mild conjuctival pallor  Eyes: Conjunctivae and EOM are normal.  Cardiovascular: Normal rate, regular rhythm and intact distal pulses.   Pulmonary/Chest: Effort normal and breath sounds normal. No stridor. No respiratory distress. She has no wheezes. She has no rales. She exhibits no tenderness.  Abdominal: Soft. Bowel sounds are normal. She exhibits no distension and no mass. There is no tenderness. There is no rebound and no guarding.  Genitourinary:  Rectal exam chaperoned by nurse Freda Munro: Scant bright red blood around the anus. Small external hemorrhoid. Normal rectal tone. Gross blood on DRE.   Musculoskeletal: Normal range of motion. She exhibits no edema.  Neurological: She is alert and oriented to person, place, and time.  Psychiatric: She has a normal mood and affect.    ED Course  Procedures (including critical care time) Labs Review Labs Reviewed  BASIC METABOLIC PANEL - Abnormal; Notable for the following:    Sodium 136 (*)    Glucose, Bld 100 (*)    GFR calc non Af Amer 77 (*)    GFR calc Af Amer 89 (*)    All other components within normal limits  CBC WITH DIFFERENTIAL -  Abnormal; Notable for the following:    RBC 3.41 (*)    Hemoglobin 10.5 (*)    HCT 30.5 (*)    All other components within normal limits  POC OCCULT BLOOD, ED - Abnormal; Notable for the following:    Fecal Occult Bld POSITIVE (*)    All other components within normal limits  PROTIME-INR  APTT  HEPATIC FUNCTION PANEL  LACTIC ACID, PLASMA  CBC WITH DIFFERENTIAL  HEMOGLOBIN AND HEMATOCRIT, BLOOD  HEMOGLOBIN AND HEMATOCRIT, BLOOD  HEMOGLOBIN AND HEMATOCRIT, BLOOD  SAMPLE TO BLOOD BANK    Imaging Review No results found.   EKG Interpretation None  MDM   Final diagnoses:  Acute lower GI bleeding    Filed Vitals:   08/23/13 0916 08/23/13 0930 08/23/13 1000 08/23/13 1010  BP: 155/65 150/73 143/89 143/89  Pulse: 87 74 76 83  Temp:      TempSrc:      Resp: 18     SpO2: 99% 95% 98% 97%    Medications  pantoprazole (PROTONIX) 80 mg in sodium chloride 0.9 % 100 mL IVPB (80 mg Intravenous New Bag/Given 08/23/13 0837)  pantoprazole (PROTONIX) injection 40 mg (40 mg Intravenous Given 08/23/13 0845)  0.9 %  sodium chloride infusion ( Intravenous New Bag/Given 08/23/13 1034)    SHONDRA CAPPS is a 80 y.o. female presenting with lower GI bleed acute onset this morning. Vital signs are stable. No abdominal pain. Patient is not anticoagulated. Likely diverticular in nature. Patient has normal clotting studies, BUN is not elevated, H&H is 10.5 over 30.5 which is not atypical for her baseline.  dDx: Diverticular bleed, hemorrhoids, AV malformation, upper GI bleed with rapid transit, doubt mesenteric ischemia secondary to lack of pain.   Gastroenterology consult from [place]  [facility] appreciated: Apprised of situation and they will consult.  Internal medicine consult from [facility] hospitalist Dr. Olen Pel appreciated: Patient will be admitted to a telemetry bed and observed with serial H&H evaluation.  This is a shared visit with the attending physician who personally evaluated  the patient and agrees with the care plan.       Monico Blitz, PA-C 08/23/13 [address], PA-C 08/23/13 1111

## 2013-08-23 NOTE — ED Notes (Signed)
Pt had another bout of rectal bleeding, blood is more darker now, clots noted again. Pt denies pain, PA Nicole at bedside.

## 2013-08-23 NOTE — Progress Notes (Signed)
UR completed 

## 2013-08-23 NOTE — ED Provider Notes (Signed)
Medical screening examination/treatment/procedure(s) were conducted as a shared visit with non-physician practitioner(s) or resident and myself. I personally evaluated the patient during the encounter and agree with the findings and plan unless otherwise indicated.  I have personally reviewed any xrays and/ or EKG's with the provider and I agree with interpretation.  New brighter blood per rectum since awaking this am, gross blood in ED, polyp/ diverticular hx/ hemorrhoids. No afib hx. Pt has had recent constipation, missed last colonoscopy. Pt denies syncope or abdo pain. Plan for blood work/ lactic acid, likely observation in hospital. Exam abd soft/ NT/ active BS, normal skin color, warm extremities, overall well appearing. RRR.  GI bleed   Mariea Clonts, MD 08/23/13 [phone]

## 2013-08-23 NOTE — ED Notes (Signed)
Bed: XT06 Expected date:  Expected time:  Means of arrival:  Comments: EMS- elderly, rectal bleed, Hx of hemorrhoids

## 2013-08-23 NOTE — H&P (Signed)
[facility] History and Physical  Lisa King WJX:[id number] DOB: 1931-05-08 DOA: 08/23/2013  Referring physician: ED physician PCP: Michele Mcalpine, MD   Chief Complaint: bright red blood per rectum   HPI:  80 y.o. female with diverticulosis, colonic polyps and hemorrhoids (followed by gastroenterologist Dr. Leone Payor) presented to [facility] ED with main concern of bright red blood per rectum that she noted earlier this AM prior to this admission. She explains she is still bleeding even though she is not having bowel movement. She has been taking aspirin and aleve daily for pain but denies any similar events in the past, no previous GI bleeds. She denies abdominal pain, chest pain, shortness of breath, palpitations, fatigue, melena, nausea vomiting, hematemesis. States she is chronically constipated she takes Miralax.  In ED, pt noted to be hemodynamically stable, no indication for transfusion as Hg ~10. TRH asked to admit for further evaluation and GI consulted.   Assessment and Plan: Active Problems: Acute blood loss anemia - in pt with known history of diverticulosis, daily use of aspirin and aleve - will admit to telemetry bed - place on Protonix daily - serial H/H - follow up on GI recommendations    Radiological Exams on Admission: No results found.  Code Status: Full Family Communication: Pt at bedside Disposition Plan: Admit for further evaluation    Review of Systems:  Constitutional: Negative for fever, chills and malaise/fatigue. Negative for diaphoresis.  HENT: Negative for hearing loss, ear pain, nosebleeds, congestion, sore throat, neck pain, tinnitus and ear discharge.   Eyes: Negative for blurred vision, double vision, photophobia, pain, discharge and redness.  Respiratory: Negative for cough, hemoptysis, sputum production, shortness of breath, wheezing and stridor.   Cardiovascular: Negative for chest pain, palpitations, orthopnea, claudication and leg swelling.   Gastrointestinal: Per HPI Genitourinary: Negative for dysuria, urgency, frequency, hematuria and flank pain.  Musculoskeletal: Negative for myalgias, back pain, joint pain and falls.  Skin: Negative for itching and rash.  Neurological: Negative for dizziness and weakness. Negative for tingling, tremors, sensory change, speech change, focal weakness, loss of consciousness and headaches.  Endo/Heme/Allergies: Negative for environmental allergies and polydipsia. Does not bruise/bleed easily.  Psychiatric/Behavioral: Negative for suicidal ideas. The patient is not nervous/anxious.      Past Medical History  Diagnosis Date  . Hearing loss   . COPD (chronic obstructive pulmonary disease)   . Cigarette smoker   . Hypercholesterolemia   . GERD (gastroesophageal reflux disease)   . Diverticulosis of colon   . Colon polyps   . Hemorrhoids   . DJD (degenerative joint disease)   . Fibromyalgia   . Osteoporosis   . Anxiety     Past Surgical History  Procedure Laterality Date  . Tonsillectomy and adenoidectomy      as a child  . Stapes surgery      Dr Dorma Russell  . Cataract extraction, bilateral  2009    Social History:  reports that she quit smoking about 3 years ago. Her smoking use included Cigarettes. She smoked 0.00 packs per day. She has never used smokeless tobacco. She reports that she drinks alcohol. She reports that she does not use illicit drugs.  Allergies  Allergen Reactions  . Penicillins Anaphylaxis  . Bisphosphonates Other (See Comments)    [name]  . Simvastatin     Unknown   . Sulfonamide Derivatives Rash and Other (See Comments)    blisters    Family History  Problem Relation Age of Onset  .  Heart disease Father   . COPD Father   . Hypertension Mother   . Arthritis Mother     Prior to Admission medications   Medication Sig Start Date End Date Taking? Authorizing Provider  famotidine (PEPCID) 20 MG tablet take 1 tablet by mouth daily 08/31/12  Yes  Michele Mcalpine, MD  aspirin 81 MG tablet Take 1  Tablet  by mouth daily    Historical Provider, MD  Borage, Borago officinalis, (BORAGE OIL) 1000 MG CAPS Take 2 capsules by mouth daily.     Historical Provider, MD  calcium carbonate (TUMS - DOSED IN MG ELEMENTAL CALCIUM) 500 MG chewable tablet Chew 1 tablet by mouth daily.      Historical Provider, MD  Calcium Carbonate-Vitamin D (CALTRATE 600+D) 600-400 MG-UNIT per tablet Take 1 tablet by mouth daily.      Historical Provider, MD  clorazepate (TRANXENE) 15 MG tablet TAKE ONE-HALF TABLET BY MOUTH THREE TIMES DAILY AS NEEDED    Michele Mcalpine, MD  clorazepate (TRANXENE) 3.75 MG tablet Take a 1/2 to 1 tablet by mouth three times daily as needed 09/07/12   Michele Mcalpine, MD  cyclobenzaprine (FLEXERIL) 10 MG tablet Take 1/2 to 1 tablet by mouth three times daily as needed for muscle spasms 08/31/12   Michele Mcalpine, MD  docusate sodium (COLACE) 100 MG capsule Take 100 mg by mouth daily.     Historical Provider, MD  fish oil-omega-3 fatty acids 1000 MG capsule Take 2 g by mouth daily.      Historical Provider, MD  imipramine (TOFRANIL) 25 MG tablet take 2 tablets by mouth at bedtime 08/31/12   Michele Mcalpine, MD  Misc Natural Products [facility] ADVANCED) CAPS Take 1 capsule by mouth daily.    Historical Provider, MD  Multiple Vitamins-Minerals (MULTIVITAMIN WITH MINERALS) tablet Take 1 tablet by mouth daily.      Historical Provider, MD  naproxen sodium (ANAPROX) 220 MG tablet Take 220 mg by mouth 2 (two) times daily with a meal.      Historical Provider, MD  polyethylene glycol (MIRALAX / GLYCOLAX) packet Take 17 g by mouth daily.      Historical Provider, MD    Physical Exam: Filed Vitals:   08/23/13 0916 08/23/13 0930 08/23/13 1000 08/23/13 1010  BP: 155/65 150/73 143/89 143/89  Pulse: 87 74 76 83  Temp:      TempSrc:      Resp: 18     SpO2: 99% 95% 98% 97%    Physical Exam  Constitutional: Appears well-developed and well-nourished. No  distress.  HENT: Normocephalic. External right and left ear normal. Oropharynx is clear and moist.  Eyes: Conjunctivae and EOM are normal. PERRLA, no scleral icterus.  Neck: Normal ROM. Neck supple. No JVD. No tracheal deviation. No thyromegaly.  CVS: RRR, S1/S2 +, no murmurs, no gallops, no carotid bruit.  Pulmonary: Effort and breath sounds normal, no stridor, rhonchi, wheezes, rales.  Abdominal: Soft. BS +,  no distension, tenderness, rebound or guarding.  Musculoskeletal: Normal range of motion. No edema and no tenderness.  Lymphadenopathy: No lymphadenopathy noted, cervical, inguinal. Neuro: Alert. Normal reflexes, muscle tone coordination. No cranial nerve deficit. Skin: Skin is warm and dry. No rash noted. Not diaphoretic. No erythema. No pallor.  Psychiatric: Normal mood and affect. Behavior, judgment, thought content normal.   Labs on Admission:  Basic Metabolic Panel:  Recent Labs Lab 08/23/13 0811  NA 136*  K 4.3  CL 100  CO2  25  GLUCOSE 100*  BUN 13  CREATININE 0.79  CALCIUM 9.1   Liver Function Tests:  Recent Labs Lab 08/23/13 0811  AST 29  ALT 17  ALKPHOS 86  BILITOT 0.3  PROT 6.4  ALBUMIN 3.9   CBC:  Recent Labs Lab 08/23/13 0900  WBC 4.3  NEUTROABS 2.6  HGB 10.5*  HCT 30.5*  MCV 89.4  PLT 214    EKG: Normal sinus rhythm, no ST/T wave changes  Debbora Presto, MD  [facility] Pager [phone]  If 7PM-7AM, please contact night-coverage [email or web address] Password [email or web address] 08/23/2013, 11:07 AM

## 2013-08-24 DIAGNOSIS — D62 Acute posthemorrhagic anemia: Secondary | ICD-10-CM

## 2013-08-24 LAB — HEMOGLOBIN AND HEMATOCRIT, BLOOD
HCT: 25.9 % — ABNORMAL LOW (ref 36.0–46.0)
HCT: 26 % — ABNORMAL LOW (ref 36.0–46.0)
HEMATOCRIT: 25.6 % — AB (ref 36.0–46.0)
HEMOGLOBIN: 9 g/dL — AB (ref 12.0–15.0)
HEMOGLOBIN: 8.9 g/dL — AB (ref 12.0–15.0)
Hemoglobin: 8.8 g/dL — ABNORMAL LOW (ref 12.0–15.0)

## 2013-08-24 LAB — CBC
HCT: 27.2 % — ABNORMAL LOW (ref 36.0–46.0)
Hemoglobin: 9 g/dL — ABNORMAL LOW (ref 12.0–15.0)
MCH: 30.3 pg (ref 26.0–34.0)
MCHC: 33.1 g/dL (ref 30.0–36.0)
MCV: 91.6 fL (ref 78.0–100.0)
Platelets: 178 10*3/uL (ref 150–400)
RBC: 2.97 MIL/uL — AB (ref 3.87–5.11)
RDW: 13.6 % (ref 11.5–15.5)
WBC: 5.1 10*3/uL (ref 4.0–10.5)

## 2013-08-24 LAB — BASIC METABOLIC PANEL
Anion gap: 12 (ref 5–15)
BUN: 20 mg/dL (ref 6–23)
CO2: 23 mEq/L (ref 19–32)
CREATININE: 0.71 mg/dL (ref 0.50–1.10)
Calcium: 8.8 mg/dL (ref 8.4–10.5)
Chloride: 105 mEq/L (ref 96–112)
GFR calc Af Amer: >90 mL/min (ref >90)
GFR calc non Af Amer: 80 mL/min — ABNORMAL LOW (ref >90)
GLUCOSE: 92 mg/dL (ref 70–99)
POTASSIUM: 4.2 meq/L (ref 3.7–5.3)
Sodium: 140 mEq/L (ref 137–147)

## 2013-08-24 MED ORDER — LEVOFLOXACIN 250 MG PO TABS
250.0000 mg | ORAL_TABLET | Freq: Every day | ORAL | Status: DC
  Administered 2013-08-24 – 2013-08-25 (×2): 250 mg via ORAL
  Filled 2013-08-24 (×2): qty 1

## 2013-08-24 NOTE — Progress Notes (Signed)
[facility] PROGRESS NOTE  Lisa CARIAS ZOX:[id number] DOB: [birth year] DOA: 08/23/2013 PCP: Michele Mcalpine, MD  Assessment/Plan: 80 y.o. female with diverticulosis, colonic polyps and hemorrhoids (followed by gastroenterologist Dr. Leone Payor) presented to [facility] ED with main concern of bright red blood per rectum that she noted earlier this AM prior to this admission.   1. Acute blood loss anemia; likely diverticula bleed;  -hemodynamically stable; monitor Hg; appreciate  GI recommendations   2. DJD; Pt on chronic alive; hold NSAID with GIB; cont tylenol for pain control 3. Probable UTI; started PO atx   Radiological Exams on Admission:  No results found.  Code Status: Full  Family Communication: Pt at bedside  Disposition Plan: Admit for further evaluation    Code Status: full Family Communication:  D/w patient, her daughter Garry Heater Daughter [phone]  (indicate person spoken with, relationship, and if by phone, the number) Disposition Plan: home 24-48 hrs    Consultants:  GI  Procedures:  none  Antibiotics:  Levofloxacin 7/10<<< (indicate start date, and stop date if known)  HPI/Subjective: alert  Objective: Filed Vitals:   08/24/13 0509  BP: 140/65  Pulse: 86  Temp: 98.7 F (37.1 C)  Resp: 20    Intake/Output Summary (Last 24 hours) at 08/24/13 0940 Last data filed at 08/24/13 0651  Gross per 24 hour  Intake  992.5 ml  Output   2615 ml  Net -1622.5 ml   Filed Weights   08/23/13 1200  Weight: 60.3 kg (132 lb 15 oz)    Exam:   General:  alert  Cardiovascular: s1,s2 rrr  Respiratory: CTA BL  Abdomen: soft, nt,nd   Musculoskeletal: no LE edema   Data Reviewed: Basic Metabolic Panel:  Recent Labs Lab 08/23/13 0811 08/24/13 0432  NA 136* 140  K 4.3 4.2  CL 100 105  CO2 25 23  GLUCOSE 100* 92  BUN 13 20  CREATININE 0.79 0.71  CALCIUM 9.1 8.8   Liver Function Tests:  Recent Labs Lab 08/23/13 0811  AST 29  ALT 17   ALKPHOS 86  BILITOT 0.3  PROT 6.4  ALBUMIN 3.9   No results found for this basename: LIPASE, AMYLASE,  in the last 168 hours No results found for this basename: AMMONIA,  in the last 168 hours CBC:  Recent Labs Lab 08/23/13 0900 08/23/13 1643 08/23/13 2205 08/24/13 0432  WBC 4.3  --   --  5.1  NEUTROABS 2.6  --   --   --   HGB 10.5* 10.0* 8.9* 9.0*  HCT 30.5* 28.7* 26.2* 27.2*  MCV 89.4  --   --  91.6  PLT 214  --   --  178   Cardiac Enzymes: No results found for this basename: CKTOTAL, CKMB, CKMBINDEX, TROPONINI,  in the last 168 hours BNP (last 3 results) No results found for this basename: PROBNP,  in the last 8760 hours CBG: No results found for this basename: GLUCAP,  in the last 168 hours  No results found for this or any previous visit (from the past 240 hour(s)).   Studies: No results found.  Scheduled Meds: . calcium carbonate  500 mg Oral Daily  . docusate sodium  100 mg Oral Daily  . imipramine  50 mg Oral QHS  . pantoprazole  40 mg Oral BID  . polyethylene glycol  17 g Oral Daily  . sodium chloride  3 mL Intravenous Q12H   Continuous Infusions: . sodium chloride 50 mL/hr at 08/23/13 2007  Active Problems:   Acute blood loss anemia    Time spent: >35 minutes     Esperanza Sheets  [facility] Pager [phone]. If 7PM-7AM, please contact night-coverage at [email or web address], password [facility] 08/24/2013, 9:40 AM  LOS: 1 day

## 2013-08-24 NOTE — Progress Notes (Signed)
[facility] Progress Note    Since last GI note: "bleeding is slowing down" per patient.  Less red, less frequent.  No chest pain, no SOB. Really feels fine otherwise.  Objective: Vital signs in last 24 hours: Temp:  [97.6 F (36.4 C)-98.9 F (37.2 C)] 98.7 F (37.1 C) (07/10 0509) Pulse Rate:  [74-87] 86 (07/10 0509) Resp:  [18-20] 20 (07/10 0509) BP: (118-165)/(64-89) 140/65 mmHg (07/10 0509) SpO2:  [95 %-99 %] 98 % (07/10 0509) Weight:  [132 lb 15 oz (60.3 kg)] 132 lb 15 oz (60.3 kg) (07/09 1200) Last BM Date: 08/24/13 General: alert and oriented times 3 Heart: regular rate and rythm Abdomen: soft, non-tender, non-distended, normal bowel sounds   Lab Results:  Recent Labs  08/23/13 0900 08/23/13 1643 08/23/13 2205 08/24/13 0432  WBC 4.3  --   --  5.1  HGB 10.5* 10.0* 8.9* 9.0*  PLT 214  --   --  178  MCV 89.4  --   --  91.6    Recent Labs  08/23/13 0811 08/24/13 0432  NA 136* 140  K 4.3 4.2  CL 100 105  CO2 25 23  GLUCOSE 100* 92  BUN 13 20  CREATININE 0.79 0.71  CALCIUM 9.1 8.8    Recent Labs  08/23/13 0811  PROT 6.4  ALBUMIN 3.9  AST 29  ALT 17  ALKPHOS 86  BILITOT 0.3  BILIDIR <0.2  IBILI NOT CALCULATED    Recent Labs  08/23/13 0811  INR 0.90   Medications: Scheduled Meds: . calcium carbonate  500 mg Oral Daily  . docusate sodium  100 mg Oral Daily  . imipramine  50 mg Oral QHS  . pantoprazole  40 mg Oral BID  . polyethylene glycol  17 g Oral Daily  . sodium chloride  3 mL Intravenous Q12H   Continuous Infusions: . sodium chloride 50 mL/hr at 08/23/13 2007   PRN Meds:.clorazepate, cyclobenzaprine, ondansetron (ZOFRAN) IV, ondansetron    Assessment/Plan: 80 y.o. female with painless rectal bleeding, presumed diverticular  Seems to be clinically slowing down. She does not need blood transfusion at this point.  Will observe at least another 24 hours.  If she slows, stops bleeding possibly home Saturday or Sunday.  If bleeding recurs, will prep her for colonoscopy, attempt to localize and stop the site of bleeding.    Milus Banister, MD  08/24/2013, 8:16 AM [facility] Pager [phone]

## 2013-08-25 DIAGNOSIS — N3000 Acute cystitis without hematuria: Secondary | ICD-10-CM

## 2013-08-25 LAB — CBC
HCT: 26.2 % — ABNORMAL LOW (ref 36.0–46.0)
Hemoglobin: 8.9 g/dL — ABNORMAL LOW (ref 12.0–15.0)
MCH: 30.6 pg (ref 26.0–34.0)
MCHC: 34 g/dL (ref 30.0–36.0)
MCV: 90 fL (ref 78.0–100.0)
PLATELETS: 199 10*3/uL (ref 150–400)
RBC: 2.91 MIL/uL — ABNORMAL LOW (ref 3.87–5.11)
RDW: 13.5 % (ref 11.5–15.5)
WBC: 5.4 10*3/uL (ref 4.0–10.5)

## 2013-08-25 LAB — HEMOGLOBIN AND HEMATOCRIT, BLOOD
HCT: 27.1 % — ABNORMAL LOW (ref 36.0–46.0)
Hemoglobin: 9.3 g/dL — ABNORMAL LOW (ref 12.0–15.0)

## 2013-08-25 MED ORDER — ACETAMINOPHEN 500 MG PO TABS: 500.0000 mg | ORAL_TABLET | Freq: Four times a day (QID) | ORAL | Status: DC | PRN

## 2013-08-25 MED ORDER — LEVOFLOXACIN 250 MG PO TABS: 250.0000 mg | ORAL_TABLET | Freq: Every day | ORAL | Status: DC

## 2013-08-25 MED ORDER — OMEPRAZOLE 40 MG PO CPDR: 40.0000 mg | DELAYED_RELEASE_CAPSULE | Freq: Every day | ORAL | Status: DC

## 2013-08-25 NOTE — Discharge Summary (Addendum)
Physician Discharge Summary  RIANSHI ECKARDT JXB:[id number] DOB: 10-Feb-1932 DOA: 08/23/2013  PCP: Michele Mcalpine, MD  Admit date: 08/23/2013 Discharge date: 08/25/2013  Time spent: >35 minutes  Recommendations for Outpatient Follow-up:  F/u with GI  In 3-4 week s F/u with PCP in 1 week Discharge Diagnoses:  Active Problems:   Acute blood loss anemia   Discharge Condition: stable   Diet recommendation: heart healthy   Filed Weights   08/23/13 1200  Weight: 60.3 kg (132 lb 15 oz)    History of present illness:  80 y.o. female with diverticulosis, colonic polyps and hemorrhoids (followed by gastroenterologist Dr. Leone Payor) presented to [facility] ED with main concern of bright red blood per rectum that she noted earlier this AM prior to this admission.   Hospital Course:  1. Acute blood loss anemia; likely diverticula bleed;  -hemodynamically stable; likely bleeding stopped, no new episodes; Hg stable; appreciate GI recommendations; f/u with GI as outpatient to schedule colonoscopy  2. DJD; Pt on chronic alive; hold NSAID with GIB; cont tylenol for pain control  3. Probable UTI; afebrile; no leukocytosis; to complete empiric treatment with PO atx   Procedures:  none (i.e. Studies not automatically included, echos, thoracentesis, etc; not x-rays)  Consultations:  GI  Discharge Exam: Filed Vitals:   08/25/13 0604  BP: 149/58  Pulse: 86  Temp: 98 F (36.7 C)  Resp: 20    General: alert Cardiovascular: s1,s2 rrr Respiratory: CTA BL  Discharge Instructions  Discharge Instructions   Diet - low sodium heart healthy    Complete by:  As directed      Discharge instructions    Complete by:  As directed   Please follow up with gastroenterologist in 3-4 weeks  Please follow up with primary care doctor in 1 week     Increase activity slowly    Complete by:  As directed             Medication List    STOP taking these medications       aspirin 81 MG tablet     naproxen  sodium 220 MG tablet  Commonly known as:  ANAPROX      TAKE these medications       acetaminophen 500 MG tablet  Commonly known as:  TYLENOL  Take 1 tablet (500 mg total) by mouth every 6 (six) hours as needed.     calcium carbonate 500 MG chewable tablet  Commonly known as:  TUMS - dosed in mg elemental calcium  Chew 1 tablet by mouth daily.     CALTRATE 600+D 600-400 MG-UNIT per tablet  Generic drug:  Calcium Carbonate-Vitamin D  Take 1 tablet by mouth daily.     clorazepate 7.5 MG tablet  Commonly known as:  TRANXENE  Take 7.5 mg by mouth 2 (two) times daily as needed for anxiety (anxiety).     docusate sodium 100 MG capsule  Commonly known as:  COLACE  Take 100 mg by mouth daily.     famotidine 20 MG tablet  Commonly known as:  PEPCID  take 1 tablet by mouth daily     fish oil-omega-3 fatty acids 1000 MG capsule  Take 2 g by mouth daily.     imipramine 25 MG tablet  Commonly known as:  TOFRANIL  Take 25 mg by mouth at bedtime. take 2 tablets by mouth at bedtime     levofloxacin 250 MG tablet  Commonly known as:  LEVAQUIN  Take 1 tablet (  250 mg total) by mouth daily.     omeprazole 40 MG capsule  Commonly known as:  PRILOSEC  Take 1 capsule (40 mg total) by mouth daily.     polyethylene glycol packet  Commonly known as:  MIRALAX / GLYCOLAX  Take 17 g by mouth daily.     TART CHERRY ADVANCED Caps  Take 1 capsule by mouth daily.       Allergies  Allergen Reactions  . Penicillins Anaphylaxis  . Bisphosphonates Other (See Comments)    [name]  . Simvastatin     Unknown   . Sulfonamide Derivatives Rash and Other (See Comments)    blisters       Follow-up Information   Follow up with NADEL,SCOTT M, MD In 1 week.   Specialty:  Pulmonary Disease   Contact information:   [address] [place] [address] [phone]       Follow up with Rachael Fee, MD. Schedule an appointment as soon as possible for a visit in 3 weeks.   Specialty:   Gastroenterology   Contact information:   [address] [place] [address] [phone]        The results of significant diagnostics from this hospitalization (including imaging, microbiology, ancillary and laboratory) are listed below for reference.    Significant Diagnostic Studies: No results found.  Microbiology: Recent Results (from the past 240 hour(s))  URINE CULTURE     Status: None   Collection Time    08/23/13  4:31 PM      Result Value Ref Range Status   Specimen Description URINE, CLEAN CATCH   Final   Special Requests NONE   Final   Culture  Setup Time     Final   Value: 08/24/2013 00:28     Performed at [facility] Count PENDING   Incomplete   Culture     Final   Value: Culture reincubated for better growth     Performed at [facility]   Report Status PENDING   Incomplete     Labs: Basic Metabolic Panel:  Recent Labs Lab 08/23/13 0811 08/24/13 0432  NA 136* 140  K 4.3 4.2  CL 100 105  CO2 25 23  GLUCOSE 100* 92  BUN 13 20  CREATININE 0.79 0.71  CALCIUM 9.1 8.8   Liver Function Tests:  Recent Labs Lab 08/23/13 0811  AST 29  ALT 17  ALKPHOS 86  BILITOT 0.3  PROT 6.4  ALBUMIN 3.9   No results found for this basename: LIPASE, AMYLASE,  in the last 168 hours No results found for this basename: AMMONIA,  in the last 168 hours CBC:  Recent Labs Lab 08/23/13 0900  08/24/13 0432 08/24/13 1020 08/24/13 1617 08/24/13 2243 08/25/13 0412 08/25/13 1113  WBC 4.3  --  5.1  --   --   --  5.4  --   NEUTROABS 2.6  --   --   --   --   --   --   --   HGB 10.5*  < > 9.0* 9.0* 8.8* 8.9* 8.9* 9.3*  HCT 30.5*  < > 27.2* 25.9* 25.6* 26.0* 26.2* 27.1*  MCV 89.4  --  91.6  --   --   --  90.0  --   PLT 214  --  178  --   --   --  199  --   < > = values in this interval  not displayed. Cardiac Enzymes: No results found for this basename: CKTOTAL, CKMB, CKMBINDEX, TROPONINI,  in the last 168 hours BNP: BNP (last 3  results) No results found for this basename: PROBNP,  in the last 8760 hours CBG: No results found for this basename: GLUCAP,  in the last 168 hours     Signed:  Esperanza Sheets  [facility] 08/25/2013, 1:46 PM

## 2013-08-25 NOTE — Discharge Instructions (Signed)
Please follow up with gastroenterologist in 2-3 weeks

## 2013-08-25 NOTE — Progress Notes (Signed)
[facility] PROGRESS NOTE  Lisa King ZOX:[id number] DOB: 03-19-1931 DOA: 08/23/2013 PCP: Michele Mcalpine, MD  Assessment/Plan: 80 y.o. female with diverticulosis, colonic polyps and hemorrhoids (followed by gastroenterologist Dr. Leone Payor) presented to [facility] ED with main concern of bright red blood per rectum that she noted earlier this AM prior to this admission.   1. Acute blood loss anemia; likely diverticula bleed;  -hemodynamically stable; likely bleeding stopped, no new episodes; Hg stable; appreciate  GI recommendations  -possible d/c today if okay with GI   2. DJD; Pt on chronic alive; hold NSAID with GIB; cont tylenol for pain control 3. Probable UTI; started PO atx   Radiological Exams on Admission:  No results found.  Code Status: Full  Family Communication: Pt at bedside  Disposition Plan: Admit for further evaluation    Code Status: full Family Communication:  D/w patient, her daughter Garry Heater Daughter [phone]  (indicate person spoken with, relationship, and if by phone, the number) Disposition Plan: home 24-48 hrs    Consultants:  GI  Procedures:  none  Antibiotics:  Levofloxacin 7/10<<< (indicate start date, and stop date if known)  HPI/Subjective: alert  Objective: Filed Vitals:   08/25/13 0604  BP: 149/58  Pulse: 86  Temp: 98 F (36.7 C)  Resp: 20    Intake/Output Summary (Last 24 hours) at 08/25/13 1205 Last data filed at 08/25/13 1020  Gross per 24 hour  Intake 2047.5 ml  Output   3500 ml  Net -1452.5 ml   Filed Weights   08/23/13 1200  Weight: 60.3 kg (132 lb 15 oz)    Exam:   General:  alert  Cardiovascular: s1,s2 rrr  Respiratory: CTA BL  Abdomen: soft, nt,nd   Musculoskeletal: no LE edema   Data Reviewed: Basic Metabolic Panel:  Recent Labs Lab 08/23/13 0811 08/24/13 0432  NA 136* 140  K 4.3 4.2  CL 100 105  CO2 25 23  GLUCOSE 100* 92  BUN 13 20  CREATININE 0.79 0.71  CALCIUM 9.1 8.8    Liver Function Tests:  Recent Labs Lab 08/23/13 0811  AST 29  ALT 17  ALKPHOS 86  BILITOT 0.3  PROT 6.4  ALBUMIN 3.9   No results found for this basename: LIPASE, AMYLASE,  in the last 168 hours No results found for this basename: AMMONIA,  in the last 168 hours CBC:  Recent Labs Lab 08/23/13 0900  08/24/13 0432 08/24/13 1020 08/24/13 1617 08/24/13 2243 08/25/13 0412 08/25/13 1113  WBC 4.3  --  5.1  --   --   --  5.4  --   NEUTROABS 2.6  --   --   --   --   --   --   --   HGB 10.5*  < > 9.0* 9.0* 8.8* 8.9* 8.9* 9.3*  HCT 30.5*  < > 27.2* 25.9* 25.6* 26.0* 26.2* 27.1*  MCV 89.4  --  91.6  --   --   --  90.0  --   PLT 214  --  178  --   --   --  199  --   < > = values in this interval not displayed. Cardiac Enzymes: No results found for this basename: CKTOTAL, CKMB, CKMBINDEX, TROPONINI,  in the last 168 hours BNP (last 3 results) No results found for this basename: PROBNP,  in the last 8760 hours CBG: No results found for this basename: GLUCAP,  in the last 168 hours  Recent Results (from the past 240  hour(s))  URINE CULTURE     Status: None   Collection Time    08/23/13  4:31 PM      Result Value Ref Range Status   Specimen Description URINE, CLEAN CATCH   Final   Special Requests NONE   Final   Culture  Setup Time     Final   Value: 08/24/2013 00:28     Performed at [facility] Count PENDING   Incomplete   Culture     Final   Value: Culture reincubated for better growth     Performed at [facility]   Report Status PENDING   Incomplete     Studies: No results found.  Scheduled Meds: . calcium carbonate  500 mg Oral Daily  . docusate sodium  100 mg Oral Daily  . imipramine  50 mg Oral QHS  . levofloxacin  250 mg Oral Daily  . pantoprazole  40 mg Oral BID  . polyethylene glycol  17 g Oral Daily  . sodium chloride  3 mL Intravenous Q12H   Continuous Infusions: . sodium chloride 50 mL/hr at 08/25/13 6213    Active  Problems:   Acute blood loss anemia    Time spent: >35 minutes     Esperanza Sheets  [facility] Pager [phone]. If 7PM-7AM, please contact night-coverage at [email or web address], password [facility] 08/25/2013, 12:05 PM  LOS: 2 days

## 2013-08-26 LAB — URINE CULTURE

## 2013-08-27 ENCOUNTER — Encounter: Payer: Self-pay | Admitting: Internal Medicine

## 2013-09-03 ENCOUNTER — Other Ambulatory Visit (INDEPENDENT_AMBULATORY_CARE_PROVIDER_SITE_OTHER): Payer: Medicare Other

## 2013-09-03 ENCOUNTER — Ambulatory Visit (INDEPENDENT_AMBULATORY_CARE_PROVIDER_SITE_OTHER): Payer: Medicare Other | Admitting: Pulmonary Disease

## 2013-09-03 ENCOUNTER — Telehealth: Payer: Self-pay | Admitting: Pulmonary Disease

## 2013-09-03 ENCOUNTER — Encounter: Payer: Self-pay | Admitting: Pulmonary Disease

## 2013-09-03 VITALS — BP 138/84 | HR 75 | Temp 97.7°F | Ht 63.0 in | Wt 132.2 lb

## 2013-09-03 DIAGNOSIS — M1990 Unspecified osteoarthritis, unspecified site: Secondary | ICD-10-CM

## 2013-09-03 DIAGNOSIS — F411 Generalized anxiety disorder: Secondary | ICD-10-CM | POA: Diagnosis not present

## 2013-09-03 DIAGNOSIS — F419 Anxiety disorder, unspecified: Secondary | ICD-10-CM

## 2013-09-03 DIAGNOSIS — J449 Chronic obstructive pulmonary disease, unspecified: Secondary | ICD-10-CM

## 2013-09-03 DIAGNOSIS — K5731 Diverticulosis of large intestine without perforation or abscess with bleeding: Secondary | ICD-10-CM

## 2013-09-03 DIAGNOSIS — E538 Deficiency of other specified B group vitamins: Secondary | ICD-10-CM

## 2013-09-03 DIAGNOSIS — D62 Acute posthemorrhagic anemia: Secondary | ICD-10-CM | POA: Diagnosis not present

## 2013-09-03 DIAGNOSIS — M810 Age-related osteoporosis without current pathological fracture: Secondary | ICD-10-CM

## 2013-09-03 DIAGNOSIS — E7800 Pure hypercholesterolemia, unspecified: Secondary | ICD-10-CM | POA: Diagnosis not present

## 2013-09-03 DIAGNOSIS — K5791 Diverticulosis of intestine, part unspecified, without perforation or abscess with bleeding: Secondary | ICD-10-CM | POA: Insufficient documentation

## 2013-09-03 DIAGNOSIS — D126 Benign neoplasm of colon, unspecified: Secondary | ICD-10-CM

## 2013-09-03 DIAGNOSIS — K219 Gastro-esophageal reflux disease without esophagitis: Secondary | ICD-10-CM

## 2013-09-03 LAB — CBC WITH DIFFERENTIAL/PLATELET
Basophils Absolute: 0 10*3/uL (ref 0.0–0.1)
Basophils Relative: 0.8 % (ref 0.0–3.0)
Eosinophils Absolute: 0.2 10*3/uL (ref 0.0–0.7)
Eosinophils Relative: 3.2 % (ref 0.0–5.0)
HEMATOCRIT: 29.2 % — AB (ref 36.0–46.0)
Hemoglobin: 9.8 g/dL — ABNORMAL LOW (ref 12.0–15.0)
Lymphocytes Relative: 32.8 % (ref 12.0–46.0)
Lymphs Abs: 1.6 10*3/uL (ref 0.7–4.0)
MCHC: 33.5 g/dL (ref 30.0–36.0)
MCV: 93.4 fl (ref 78.0–100.0)
MONOS PCT: 7.5 % (ref 3.0–12.0)
Monocytes Absolute: 0.4 10*3/uL (ref 0.1–1.0)
NEUTROS PCT: 55.7 % (ref 43.0–77.0)
Neutro Abs: 2.7 10*3/uL (ref 1.4–7.7)
PLATELETS: 312 10*3/uL (ref 150.0–400.0)
RBC: 3.12 Mil/uL — ABNORMAL LOW (ref 3.87–5.11)
RDW: 14.6 % (ref 11.5–15.5)
WBC: 4.8 10*3/uL (ref 4.0–10.5)

## 2013-09-03 LAB — VITAMIN D 25 HYDROXY (VIT D DEFICIENCY, FRACTURES): VITD: 79.68 ng/mL

## 2013-09-03 LAB — IBC PANEL
IRON: 61 ug/dL (ref 42–145)
SATURATION RATIOS: 16.8 % — AB (ref 20.0–50.0)
Transferrin: 259.3 mg/dL (ref 212.0–360.0)

## 2013-09-03 LAB — VITAMIN B12: Vitamin B-12: 588 pg/mL (ref 211–911)

## 2013-09-03 LAB — TSH: TSH: 0.95 u[IU]/mL (ref 0.35–4.50)

## 2013-09-03 MED ORDER — IMIPRAMINE HCL 25 MG PO TABS: 25.0000 mg | ORAL_TABLET | Freq: Every day | ORAL | Status: DC

## 2013-09-03 MED ORDER — CLORAZEPATE DIPOTASSIUM 7.5 MG PO TABS: 7.5000 mg | ORAL_TABLET | Freq: Two times a day (BID) | ORAL | Status: DC | PRN

## 2013-09-03 NOTE — Patient Instructions (Signed)
Today we updated your med list in our EPIC system...    Continue your current medications the same...  Today we did your follow up non-fasting blood work...    We will contact you w/ the results when available...   Try OTC FERROSEQUELS iron tabs- take one tab daily...    Be sure to use the OTC MIRALAX daily...    And take OTC SENAKOT-S one tab at bedtime...  We will arrange for a GI follow up soon w/ DrGessner's Team...  Let's plan a follow up visit here in 89mo to recheck your blood work.Marland KitchenMarland Kitchen

## 2013-09-03 NOTE — Progress Notes (Signed)
Subjective:    Patient ID: Lisa King, female    DOB: 09/30/1931, 80 y.o.   MRN: [id number]  HPI 80 y/o WF here for a follow up visit... she has mult med problems as noted...  Followed for general medical purposes w/ hx of COPD, Hyperchol, GERD/ Diverics/ Polyps, DJD/ FM/ Osteoporosis, and anxiety... she likes to take various supplements, and she is also followed by Rodena Medin for GI, and DrKendall for Ortho... ~  SEE PREV EPIC NOTES FOR OLDER DATA>>  ~  August 21, 2010:  Yearly ROV & she states "I quit smoking but I cheat now & again";  She suffered a dog bite to left ankle 5/12 & still has some discomfort but wound is healed etc;  She notes some sinus drainage, but denies cough, phlegm, hemoptysis, CP, palpit, SOB, or edema;  We discussed her severe osteoporosis & the signif risk it imposes> she has refuses meds, endocrine consult, & hasn't f/u w/ GYN; we reviewed avail options & she has agreed to try ATELVIA 35mg /wk as this is supposed to be easier on the GI track...    CXR today is stable, NAD; and recent blood work looked good (see below)>>  ~  August 25, 2011:  Yearly ROV & Lisa King says she fell 2 wks ago (trying to kill a spider) w/ some chest wall discomfort & requests a muscle relaxer; she still smokes a cig "now & then" and has underlying COPD but declines regular inhalers,etc; fortunately she has avoided major upper resp infections & denies cough, sputum, ch in SOB/DOE etc; she gets a little exercise by walking...     We reviewed prob list, meds, xrays and labs> see below for updates>>  CXR 7/13 showed heart at the upper lim of norm in size, lungs are clear/ overinflated c/w COPD, apical pleural thickening, NAD...  LABS 7/13:  FLP- not at goals on diet alone w/ TChol 219 LDL=115;  Chems- wnl;  CBC- wnl;  TSH=1.38;  VitD=52    ~  August 31, 2012:  89yr ROV & Lisa King describes a good yr- just c/o some insomnia & arthritis pain... We reviewed the following medical problems during today's office  visit >>     Hearing Loss> she had f/u appt w/ drBates ENT 3/14 & cerumen impactions removed...    COPD, Cig smoker> states her last cig was 02/15/12; still refuses breathing meds; states her breathing is good, notes better energy, & denies cough, sput, hemoptysis, SOB, etc; she is too sedentary & needs to incr exercise...    CHOL> on Fish Oil; FLP is improved on diet alone- TChol 191, TG 107, HDL 61, LDL 109     GI- GERD, Divertics, Polyps> on OTC H2Blocker like Pepcid/Zantac, Miralax, Colace; she is due for a follow up colonoscopy this yr & we will refer...    DJD, FM> on Anaprox220Bid prn, Flexeril10Tid prn, BorageOil & "tart cherry advanced"; states that air conditioning makes her neck arthritis worse; advised heating pad & anti-inflamm meds...    Osteoporosis> on Calcium, MVI; BMD was -3.5 in spine in 2007 & she has repeatedly refused f/u BMD, Bisphos med rx, or alternative med rx or endocrine consultation; only agrees to take Calcium & Vits despite the potential severe consequences...    Anxiety, Insomnia> on Tranxene15-1/2Tid [facility] changed her to the 15mg  tabs), Tofranil25-2Qhs...  We reviewed prob list, meds, xrays and labs> see below for updates >>   LABS 7/14:  FLP- improved on diet alone;  Chems- wnl;  CBC- ok w/ Hg=11.7 MCV=93;  TSH=1.06;  VitD=61  ~  September 03, 2013:  Yearly ROV & post hospital f/u visit> Lisa King was [facility] by [facility] 7/9 - 08/25/13 w/ a lower GIB believed to be a self-limited divertic bleed that resolved on it's own;  Hx GERD, divertics, polyps, constip, and hems;  Followed by DrGessner on Famotidine20, & Miralax, last colon 10/09 showed divertics and several polyps (adenomatous & hyperplastic), f/u suggested in 24yrs & now overdue;  She presented w/ rectal bleeding- dark red blood- but no abd pain, no n/v, not lightheaded or syncopal, hemodynamically stable w/o hypotension & Hg in the 9-10 range;  She was seen by DrJacobs who rec conservative approach & outpt f/u to arrange for  her needed colonoscopy...    Hearing Loss> she has hearing aides from [facility] & prev cerumen impactions removed...    COPD, ex Cig smoker> states her last cig was 02/15/12; still refuses breathing meds; states her breathing is good, notes better energy, & denies cough, sput, hemoptysis, SOB, etc; she is too sedentary & needs to incr exercise...    CHOL> on Fish Oil; FLP 7/14 was improved on diet alone- TChol 191, TG 107, HDL 61, LDL 109... She is not fasting for f/u labs today.    GI- GERD, Divertics, constip, Polyps, hems> on OTC Pepcid20 (refused to take the Omep40) & Miralax; she did not set up her colonoscopy due in 2014; Adm 7/15 w/ divertic bleed, self-lim, hemodynam stable, Hg nadir= 8.8; placed on Fe, Prilosec, Miralax & asked to stop ASA, NSAIDs; she refuses Fe due to constip, refuses Prilosec ("I take Pepcid"), still taking her ASA; asked to try Ferrosequels, Miralax daily, Senakot-S Qhs, and f/u w/ GI- appt sched.    GU> she had a UTI w/ [facility] 7/15 Hosp; treated w/ Levaquin...     DJD, FM> prev on Anaprox220Bid prn, Flexeril10Tid prn, BorageOil & "tart cherry advanced"; states that air conditioning makes her neck arthritis worse; advised heating pad & anti-inflamm meds prn...    Osteoporosis> on Calcium, MVI; BMD was -3.5 in spine in 2007 & she has repeatedly refused f/u BMD, Bisphos med rx, or alternative med rx or endocrine consultation; only agrees to take Calcium & Vits despite the potential severe consequences...    Anxiety, Insomnia> on Tranxene7.5-1/2Tid prn, Tofranil25-2Qhs...     Anemia> Lower GIB 7/15 believed due to divertics; Hg nadir= 8.8, she is INTOL to Fe w/ constip, rec to take [facility] + Miralax/ Senakot-S... We reviewed prob list, meds, xrays and labs> see below for updates >>   EKG 7/15 showed NSR, rate75, inferior scar & abn r progression...   LABS in hosp 7/15:  Chems- wnl;  CBC- Hg in the 9/10 range;  UA w/ +WBC, RBC, bact & Cult +EColi sens  Levaquin...  LABS in office 7/15:  CBC- Hg=9.8, Fe=61 (17%);  TSH=0.95;  Vit B12= 588;  Vit D= 80...           Problem List:   HEARING LOSS (ICD-389.9) - hx bilat stapes surg for hearing problems by DrKraus yrs ago... states she can't have MRI's due to the stapes implants... now sees DrBates w/ hearing aides bilat... ~  3/14:  She had f/u DrBates> URI & cerumen impactions- removed + conservative therapy...  ~  7/15:  She has hearing aides from [facility]...  COPD (ICD-496) - states her breathing is OK and she denies SOB, and declines use of inhalers, ex= walking... She denies cough, sputum, hemoptysis,  CP, palpit SOB, edema... she has declined to do PFT's in the past... ~  CXR 7/11 showed COPD, atherosclerotic calcif in Ao, osteopenic bones, compression of 2 mid thor vertebral bodies, NAD.Marland Kitchen. ~  CXR 7/12 showed hyperinflation, some apical pleural scarring, sm HH, mitral annular calcif, NAD.Marland Kitchen. ~  CXR 7/13 showed heart at the upper lim of norm in size, lungs are clear/ overinflated c/w COPD, apical pleural thickening, NAD...  CIGARETTE SMOKER (ICD-305.1) - states she's quit smoking now "but I cheat now & again"... she refuses Chantix stating that a friend tried it and died!!! We discussed other smoking cessation strategies... ~  7/13:  She states "I don't buy them anymore" but smokes now & then when she can get them... ~  States she quit 02/15/12...   HYPERCHOLESTEROLEMIA (ICD-272.0) - she has refused statin Rx in the past... tried Simvastatin 20mg /d 5/09 but states that she developed "muscle cramps, throat closing & insomnia" which resolved off this med- refuses to try other meds "I'm on Fish Oil & diet"... ~  FLP 11/07 showed Tchol 214, TG 130, HDL 51, LDL 125... refused statin, perfers diet alone... ~  FLP 5/09 showed TChol 228, TG 200, HDL 48, LDL 139... rec- start Simva20... pt DC'd it. ~  FLP 7/10 showed TChol 219, TG 171, HDL 54, LDL 127... refuses Fibrate or other lipid meds. ~  FLP 6/11  showed TChol 234, TG 166, HDL 55, LDL 133... discussed diet Rx. ~  FLP 6/12 showed TChol 196, TG 134, HDL 60, LDL 110... Improved on diet Rx! ~  FLP 7/13 showed TChol 219, TG 139, HDL 68, LDL 115... Offered low dose statin but she declines... ~  FLP 7/14 on diet alone showed TChol 191, TG 107, HDL 61, LDL 109... Improved on diet alone.  GERD (ICD-530.81)  -  on PEPCID 20mg Qd but she is adamant that FAMOTIDINE works much better than Pepcid... followed for GI by DrGessner...  DIVERTICULOSIS OF COLON (ICD-562.10) - she denies D, C, blood, ch in bowel habits... COLONIC POLYPS (ICD-211.3) - colonoscopy 7/06 showed several 4-10mm adenomatous polyps in a long redundant colon w/ divertics & hems... f/u colonoscopy 10/09 showed divertics, several polyps= adenomatous & hyperpl w/ f/u colon planned for 73yrs... HEMORRHOIDS (ICD-455.6) ~  2014> she is due for a f/u w/ DrGessner re: f/u colonoscopy => she never sched this f/u visit... ~  7/15: Hosp by [facility] w/ Lower GIB believed to be a diverticv bleed, self limited, hemodynamically stable, Hg~9 range, & set up for outpt f/u by [employer]...  DEGENERATIVE JOINT DISEASE (ICD-715.90) - hx of leg pain w/ norm ABI's 2007... eval by Janett Billow for Ortho w/ "arthritis and scoliosis" treated w/ muscle relaxer and improved... she states she can't get MRI due to prev stapes surg... ~  7/11: she notes that black cherry concentrate is good "it took a freind's pain away"...  FIBROMYALGIA (ICD-729.1) - on IMIPRAMINE 25mg - 2tabs daily... ~  7/12:  She notes that her FM pain was exac by the dog bite 5/12...  OSTEOPOROSIS (ICD-733.00) - known severe osteoporosis w/ BMD 11/07 showing TScores -3.5 in spine & -2.6 in left fem neck... pt states intol to [facility] and refused other bisphosphonates or miacalcin... in addition she has refuses Endocrine referral to discuss further... "I take calcium and vitamins" but I carefully explained how that is not sufficient and that she is likely to  suffer a serious compression fracture of her sp, or a fall could easily fracture her hip- she refuses Rx  or further help... I have advised her to talk to her GYN about this as well. ~  7/10:  We discussed osteoporosis options again including bisphos, reclast, miacalcin, forteo, & the new prolia- she is not interested in Rx or in Endocrine second opinion... ~  7/11:  Ditto... ~  7/12:  I reviewed all the above & offered the new ATELVIA 35mg /wk (fewer GI problems) and she agreed to try it, Rx written==> stopped on her own. ~  7/13:  We reviewed the situation w/ her bones, osteoporosis & her risks; she again refuses meds for this problem, and declines offers for subspec referral (Endocrine, etc)... ~  7/14:  She once again confirms her staunch refusal to consider f/u BMD, med rx of any kind for her osteoporosis, or 2nd opinion consultation... ~  Labs 7/15 showed Vit D = 80...  ANXIETY (ICD-300.00)- she takes Tranxene7.5 at bedtime to help her sleep...  ANEMIA>  She was [facility] 7/15 w/ Lower GIB believed to be due to divertics, managed conservatively & referred to GI as outpt for f/u... ~  Baseline labs showed Hg ~12 range... ~  Labs 7/15 in hosp showed Hg= 9-10 range w/ nadir=8.8; placed on Fe at disch but poorly tolerated.... ~  Labs 7/15 in office f/u showed Hg= 9.8, Fe= 61 (17%sat), B12= 588... rec to try Ferrosequels...  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 she takes ASA 81mg /d... had Tetanus shot 1997 & Pneumovax 2000... she refuses Flu shots! ~  Supplement meds include> Calc w/ D, MVI, Tums, FishOil, BorageOil, FlaxSeedOil, etc...   Past Surgical History  Procedure Laterality Date  . Tonsillectomy and adenoidectomy      as a child  . Stapes surgery      Dr Dorma Russell  . Cataract extraction, bilateral  2009    Outpatient Encounter Prescriptions as of 09/03/2013  Medication Sig  . aspirin 81 MG tablet Take 81 mg by mouth daily.  . Calcium Carbonate-Vitamin D (CALTRATE 600+D)  600-400 MG-UNIT per tablet Take 1 tablet by mouth daily.    . cholecalciferol (VITAMIN D) 1000 UNITS tablet Take 1,000 Units by mouth daily.  . clorazepate (TRANXENE) 7.5 MG tablet Take 1 tablet (7.5 mg total) by mouth 2 (two) times daily as needed for anxiety (anxiety).  . cyclobenzaprine (FLEXERIL) 10 MG tablet Take 10 mg by mouth 3 (three) times daily as needed for muscle spasms.  Marland Kitchen docusate sodium (COLACE) 100 MG capsule Take 100 mg by mouth daily.   . famotidine (PEPCID) 20 MG tablet take 1 tablet by mouth daily  . fish oil-omega-3 fatty acids 1000 MG capsule Take 2 g by mouth daily.    Marland Kitchen imipramine (TOFRANIL) 25 MG tablet Take 1 tablet (25 mg total) by mouth at bedtime. take 2 tablets by mouth at bedtime  . Misc Natural Products (TART CHERRY ADVANCED) CAPS Take 1 capsule by mouth daily.  . naproxen sodium (ANAPROX) 220 MG tablet Take 220 mg by mouth at bedtime.  . polyethylene glycol (MIRALAX / GLYCOLAX) packet Take 17 g by mouth daily.    . [DISCONTINUED] clorazepate (TRANXENE) 7.5 MG tablet Take 7.5 mg by mouth 2 (two) times daily as needed for anxiety (anxiety).  . [DISCONTINUED] imipramine (TOFRANIL) 25 MG tablet Take 25 mg by mouth at bedtime. take 2 tablets by mouth at bedtime  . [DISCONTINUED] acetaminophen (TYLENOL) 500 MG tablet Take 1 tablet (500 mg total) by mouth every 6 (six) hours as needed.  . [DISCONTINUED] calcium carbonate (TUMS -  DOSED IN MG ELEMENTAL CALCIUM) 500 MG chewable tablet Chew 1 tablet by mouth daily.    . [DISCONTINUED] levofloxacin (LEVAQUIN) 250 MG tablet Take 1 tablet (250 mg total) by mouth daily.  . [DISCONTINUED] omeprazole (PRILOSEC) 40 MG capsule Take 1 capsule (40 mg total) by mouth daily.    Allergies  Allergen Reactions  . Penicillins Anaphylaxis  . Bisphosphonates Other (See Comments)    [name]  . Simvastatin     Unknown   . Sulfonamide Derivatives Rash and Other (See Comments)    blisters    Current Medications, Allergies, Past  Medical History, Past Surgical History, Family History, and Social History were reviewed in [employer] record.   Review of Systems       See HPI - all other systems neg except as noted...       The patient complains of dyspnea on exertion, severe indigestion/heartburn, muscle weakness, and difficulty walking.  The patient denies anorexia, fever, weight loss, weight gain, vision loss, decreased hearing, hoarseness, chest pain, syncope, peripheral edema, prolonged cough, headaches, hemoptysis, abdominal pain, melena, hematochezia, hematuria, incontinence, suspicious skin lesions, transient blindness, depression, unusual weight change, abnormal bleeding, enlarged lymph nodes, and angioedema.     Objective:   Physical Exam    WD, WN, 80 y/o WF in NAD... GENERAL:  Alert & oriented; pleasant & cooperative... HEENT:  [place]/AT, EOM-wnl, PERRLA, EACs-clear, TMs-wnl, NOSE-clear, THROAT-clear & wnl. NECK:  Supple w/ fairROM; no JVD; normal carotid impulses w/o bruits; no thyromegaly or nodules palpated; no lymphadenopathy. CHEST:  decr BS bilat, clear to P & A; without wheezes/ rales/ or rhonchi heard... HEART:  Regular Rhythm; without murmurs/ rubs/ or gallops detected... ABDOMEN:  Soft & nontender; normal bowel sounds; no organomegaly or masses palpated... EXT: without deformities, mod arthritic changes; no varicose veins/ +venous insuffic/ no edema. NEURO:  CN's intact; no focal neuro deficits... DERM:  No lesions noted; prev nasal skin surg; no rash etc...  RADIOLOGY DATA:  Reviewed in the EPIC EMR & discussed w/ the patient...  LABORATORY DATA:  Reviewed in the EPIC EMR & discussed w/ the patient...   Assessment & Plan:    GIB, likely divertic bleed> self limited, feeling better, c/o chr constip & rec to take Miralax & Senakot-S (but she wants Linzess- like her brother); referred to GI for f/u eval & to sched colon...  Anemia> due to GIB; f/u Hg=9.8, Fe=61, and rec to  take [facility]...   COPD>  States she's quit smoking but stiil smokes some (cheating) & advised to quit completely... Denies SOB etc but she is clearly too sedentary & asked to incr activity/ exercise...  CHOL>  On diet alone & recent FLP is improved & close tyo goals...   GI>  GERD/ Divertics/ Polyps>  Followed by Rodena Medin for GI; stable on Famotidine which she swears works better that [employer]; due for 70yr f/u colon in 2014...  DJD/ FM>  Stable overall; uses OTC anti-inflamm as needed + Imipramine Qhs...  Osteoporosis/ Compression fx>  She has 2 compression fx that are old, she is very high risk, we had another long conversation about her osteoporosis & the risk it imposes, we reviewed the avail therapies (again) but she declines all avail therapies & refuses second opinion consult etc...   Anxiety>  She uses the Chlorazepate as needed...   Patient's Medications  New Prescriptions   No medications on file  Previous Medications   ASPIRIN 81 MG TABLET    Take  81 mg by mouth daily.   CALCIUM CARBONATE-VITAMIN D (CALTRATE 600+D) 600-400 MG-UNIT PER TABLET    Take 1 tablet by mouth daily.     CHOLECALCIFEROL (VITAMIN D) 1000 UNITS TABLET    Take 1,000 Units by mouth daily.   CYCLOBENZAPRINE (FLEXERIL) 10 MG TABLET    Take 10 mg by mouth 3 (three) times daily as needed for muscle spasms.   DOCUSATE SODIUM (COLACE) 100 MG CAPSULE    Take 100 mg by mouth daily.    FAMOTIDINE (PEPCID) 20 MG TABLET    take 1 tablet by mouth daily   FISH OIL-OMEGA-3 FATTY ACIDS 1000 MG CAPSULE    Take 2 g by mouth daily.     MISC NATURAL PRODUCTS (TART CHERRY ADVANCED) CAPS    Take 1 capsule by mouth daily.   NAPROXEN SODIUM (ANAPROX) 220 MG TABLET    Take 220 mg by mouth at bedtime.   POLYETHYLENE GLYCOL (MIRALAX / GLYCOLAX) PACKET    Take 17 g by mouth daily.    Modified Medications   Modified Medication Previous Medication   CLORAZEPATE (TRANXENE) 7.5 MG TABLET clorazepate (TRANXENE) 7.5 MG tablet       Take 1 tablet (7.5 mg total) by mouth 2 (two) times daily as needed for anxiety (anxiety).    Take 7.5 mg by mouth 2 (two) times daily as needed for anxiety (anxiety).   IMIPRAMINE (TOFRANIL) 25 MG TABLET imipramine (TOFRANIL) 25 MG tablet      Take 1 tablet (25 mg total) by mouth at bedtime. take 2 tablets by mouth at bedtime    Take 25 mg by mouth at bedtime. take 2 tablets by mouth at bedtime  Discontinued Medications   ACETAMINOPHEN (TYLENOL) 500 MG TABLET    Take 1 tablet (500 mg total) by mouth every 6 (six) hours as needed.   CALCIUM CARBONATE (TUMS - DOSED IN MG ELEMENTAL CALCIUM) 500 MG CHEWABLE TABLET    Chew 1 tablet by mouth daily.     LEVOFLOXACIN (LEVAQUIN) 250 MG TABLET    Take 1 tablet (250 mg total) by mouth daily.   OMEPRAZOLE (PRILOSEC) 40 MG CAPSULE    Take 1 capsule (40 mg total) by mouth daily.

## 2013-09-03 NOTE — Telephone Encounter (Signed)
Called and spoke with betty with the pharmacy and they are aware of sig on medication.  Med list has been updated. Medication should be 2 tablets at bedtime.

## 2013-09-10 ENCOUNTER — Encounter: Payer: Self-pay | Admitting: *Deleted

## 2013-09-14 ENCOUNTER — Ambulatory Visit (INDEPENDENT_AMBULATORY_CARE_PROVIDER_SITE_OTHER): Payer: Medicare Other | Admitting: Gastroenterology

## 2013-09-14 ENCOUNTER — Encounter: Payer: Self-pay | Admitting: Gastroenterology

## 2013-09-14 VITALS — BP 140/80 | HR 68 | Ht 63.0 in | Wt 131.4 lb

## 2013-09-14 DIAGNOSIS — Z860100 Personal history of colon polyps, unspecified: Secondary | ICD-10-CM | POA: Insufficient documentation

## 2013-09-14 DIAGNOSIS — K5900 Constipation, unspecified: Secondary | ICD-10-CM | POA: Diagnosis not present

## 2013-09-14 DIAGNOSIS — K922 Gastrointestinal hemorrhage, unspecified: Secondary | ICD-10-CM

## 2013-09-14 DIAGNOSIS — K5901 Slow transit constipation: Secondary | ICD-10-CM | POA: Insufficient documentation

## 2013-09-14 HISTORY — DX: Constipation, unspecified: K59.00

## 2013-09-14 MED ORDER — NA SULFATE-K SULFATE-MG SULF 17.5-3.13-1.6 GM/177ML PO SOLN
ORAL | Status: DC
Start: 2013-09-14 — End: 2013-10-18

## 2013-09-14 MED ORDER — NA SULFATE-K SULFATE-MG SULF 17.5-3.13-1.6 GM/177ML PO SOLN: ORAL | Status: DC

## 2013-09-14 MED ORDER — LINACLOTIDE 145 MCG PO CAPS: 145.0000 ug | ORAL_CAPSULE | Freq: Every day | ORAL | Status: DC

## 2013-09-14 NOTE — Patient Instructions (Signed)
You have been scheduled for a colonoscopy. Please follow written instructions given to you at your visit today.  Please pick up your prep kit at the pharmacy within the next 1-3 days. If you use inhalers (even only as needed), please bring them with you on the day of your procedure. Your physician has requested that you go to www.startemmi.com and enter the access code given to you at your visit today. This web site gives a general overview about your procedure. However, you should still follow specific instructions given to you by our office regarding your preparation for the procedure.  We have sent the following medications to your pharmacy for you to pick up at your convenience: Linzess 145, please take one capsule by mouth on an empty stomach once daily

## 2013-09-14 NOTE — Progress Notes (Signed)
09/14/2013 AVI FELVER [id number] 1931/03/13   History of Present Illness:  This is a pleasant 80 year old female who is known to Dr. Leone Payor for previous colonoscopy.  Last colonoscopy was in 11/2007 at which time she had 9 polyps removed; two were >5 mm.  A couple of the polyps were adenomatous and others were hyperplastic.  She also had diverticulosis, external hemorrhoids, and a long/redundant colon.  It was recommended that she have a repeat procedure in 5 years from that time.    She was recently hospitalized for a LGIB (7/9-7/11).  It was presumed to be diverticular in origin and resolved without intervention in 48 hours.  She has not seen any further bleeding since that time.  Hgb was 9.3 grams at the time of discharge.  It was 9.8 grams as an outpatient 9 days later on 7/20.  She does admit to chronic constipation.  She uses Miralax and stool softeners.  Someone that she knows is taking Linzess with good results and she is asking if she can try that in place of her current regimen.      Current Medications, Allergies, Past Medical History, Past Surgical History, Family History and Social History were reviewed in [employer] record.   Physical Exam: BP 140/80  Pulse 68  Ht 5\' 3"  (1.6 m)  Wt 131 lb 6.4 oz (59.603 kg)  BMI 23.28 kg/m2 General: Well developed white female in no acute distress Head: Normocephalic and atraumatic Eyes:  Sclerae anicteric, conjunctiva pink  Ears: Normal auditory acuity Lungs: Clear throughout to auscultation Heart: Regular rate and rhythm Abdomen: Soft, non-distended.  Normal bowel sounds.  Non-tender. Rectal:  Deferred.  Will be done at the time of colonoscopy. Musculoskeletal: Symmetrical with no gross deformities  Extremities: No edema  Neurological: Alert oriented x 4, grossly nonfocal Psychological:  Alert and cooperative. Normal mood and affect  Assessment and Recommendations: -Personal history of colon  polyps:  9 polyps removed in 11/2007.  Will schedule colonoscopy with Dr. Leone Payor.  The risks, benefits, and alternatives were discussed with the patient and she consents to proceed.  -LGIB:  Thought to be diverticular in origin.  Bleeding stopped and has not recurred.  Hgb stable/improved. -Constipation:  Patient would like to try Linzess.  Will start her on 145 mcg daily and can increase if needed.

## 2013-09-15 NOTE — Progress Notes (Signed)
Agree with Ms. Zehr's management.  Kierre Deines E. Seymore Brodowski, MD, FACG  

## 2013-09-17 ENCOUNTER — Telehealth: Payer: Self-pay | Admitting: *Deleted

## 2013-09-17 MED ORDER — LUBIPROSTONE 8 MCG PO CAPS: 8.0000 ug | ORAL_CAPSULE | Freq: Two times a day (BID) | ORAL | Status: DC

## 2013-09-17 NOTE — Telephone Encounter (Signed)
Per fax from [employer] is not covered on patients formulary.  Patient has not tried Amitiza, per Janett Billow okay to try Amitiza 8 mcg twice daily. Called patient and advised patient of Insurance recommendations. Advised patient to try Amitiza for one month and if that does not work we can try sending [place] again.

## 2013-09-28 ENCOUNTER — Other Ambulatory Visit: Payer: Self-pay | Admitting: Pulmonary Disease

## 2013-10-11 ENCOUNTER — Telehealth: Payer: Self-pay | Admitting: Pulmonary Disease

## 2013-10-11 NOTE — Telephone Encounter (Signed)
Called and spoke with pt and she is aware of appt time change.  Nothing further is needed.

## 2013-10-18 ENCOUNTER — Ambulatory Visit (AMBULATORY_SURGERY_CENTER): Payer: Medicare Other | Admitting: Internal Medicine

## 2013-10-18 ENCOUNTER — Encounter: Payer: Self-pay | Admitting: Internal Medicine

## 2013-10-18 ENCOUNTER — Other Ambulatory Visit (INDEPENDENT_AMBULATORY_CARE_PROVIDER_SITE_OTHER): Payer: Medicare Other

## 2013-10-18 VITALS — BP 151/78 | HR 73 | Temp 98.6°F | Resp 29 | Ht 63.0 in | Wt 131.0 lb

## 2013-10-18 DIAGNOSIS — Z86010 Personal history of colonic polyps: Secondary | ICD-10-CM | POA: Diagnosis not present

## 2013-10-18 DIAGNOSIS — K5731 Diverticulosis of large intestine without perforation or abscess with bleeding: Secondary | ICD-10-CM

## 2013-10-18 DIAGNOSIS — D62 Acute posthemorrhagic anemia: Secondary | ICD-10-CM

## 2013-10-18 DIAGNOSIS — IMO0001 Reserved for inherently not codable concepts without codable children: Secondary | ICD-10-CM | POA: Diagnosis not present

## 2013-10-18 DIAGNOSIS — J449 Chronic obstructive pulmonary disease, unspecified: Secondary | ICD-10-CM | POA: Diagnosis not present

## 2013-10-18 DIAGNOSIS — K6389 Other specified diseases of intestine: Secondary | ICD-10-CM

## 2013-10-18 DIAGNOSIS — K5791 Diverticulosis of intestine, part unspecified, without perforation or abscess with bleeding: Secondary | ICD-10-CM

## 2013-10-18 LAB — CBC WITH DIFFERENTIAL/PLATELET
BASOS ABS: 0 10*3/uL (ref 0.0–0.1)
Basophils Relative: 0.7 % (ref 0.0–3.0)
EOS ABS: 0 10*3/uL (ref 0.0–0.7)
Eosinophils Relative: 1.1 % (ref 0.0–5.0)
HCT: 33.4 % — ABNORMAL LOW (ref 36.0–46.0)
Hemoglobin: 11.3 g/dL — ABNORMAL LOW (ref 12.0–15.0)
LYMPHS PCT: 27.9 % (ref 12.0–46.0)
Lymphs Abs: 1.3 10*3/uL (ref 0.7–4.0)
MCHC: 33.8 g/dL (ref 30.0–36.0)
MCV: 91.3 fl (ref 78.0–100.0)
MONO ABS: 0.4 10*3/uL (ref 0.1–1.0)
Monocytes Relative: 7.5 % (ref 3.0–12.0)
NEUTROS PCT: 62.8 % (ref 43.0–77.0)
Neutro Abs: 2.9 10*3/uL (ref 1.4–7.7)
Platelets: 231 10*3/uL (ref 150.0–400.0)
RBC: 3.66 Mil/uL — ABNORMAL LOW (ref 3.87–5.11)
RDW: 13.5 % (ref 11.5–15.5)
WBC: 4.7 10*3/uL (ref 4.0–10.5)

## 2013-10-18 MED ORDER — SODIUM CHLORIDE 0.9 % IV SOLN: 500.0000 mL | INTRAVENOUS | Status: DC

## 2013-10-18 NOTE — Op Note (Signed)
[place]  [employer] [place], [id number]   COLONOSCOPY PROCEDURE REPORT  PATIENT: Lisa, King  MR#: [id number] BIRTHDATE: August 07, 1931 , 82  yrs. old GENDER: Female ENDOSCOPIST: Gatha Mayer, MD, [facility] PROCEDURE DATE:  10/18/2013 PROCEDURE:   Colonoscopy, diagnostic First Screening Colonoscopy - Avg.  risk and is 50 yrs.  old or older - No.  Prior Negative Screening - Now for repeat screening. N/A  History of Adenoma - Now for follow-up colonoscopy & has been > or = to 3 yrs.  Yes hx of adenoma.  Has been 3 or more years since last colonoscopy.  Polyps Removed Today? No.  Recommend repeat exam, <10 yrs? No. ASA CLASS:   Class III INDICATIONS:Patient's personal history of adenomatous colon polyps and hematochezia. MEDICATIONS: propofol (Diprivan) 200mg  IV, MAC sedation, administered by CRNA, and These medications were titrated to patient response per physician's verbal order  DESCRIPTION OF PROCEDURE:   After the risks benefits and alternatives of the procedure were thoroughly explained, informed consent was obtained.  A digital rectal exam revealed no abnormalities of the rectum.   The LB [id number]  endoscope was introduced through the anus and advanced to the cecum, which was identified by both the appendix and ileocecal valve. No adverse events experienced.   The quality of the prep was Suprep adequate The instrument was then slowly withdrawn as the colon was fully examined.      COLON FINDINGS: Moderate diverticulosis was noted in the sigmoid colon.   Severe melanosis was found throughout the entire examined colon.   The colon was redundant.  Manual abdominal counter-pressure was used to reach the cecum.   The colon mucosa was otherwise normal.  Retroflexed views revealed no abnormalities. The time to cecum=12 minutes 45 seconds.  Withdrawal time=8 minutes 08 seconds.  The scope was withdrawn and the procedure completed. COMPLICATIONS:  There were no complications.  ENDOSCOPIC IMPRESSION: 1.   Moderate diverticulosis was noted in the sigmoid colon 2.   Severe melanosis was found throughout the entire examined colon  3.   The colon was redundant 4.   The colon mucosa was otherwise normal - adequate pep  RECOMMENDATIONS: CBC today. No routine repeat colonoscopy   eSigned:  Gatha Mayer, MD, [facility] 10/18/2013 3:04 PM   cc: The Patient

## 2013-10-18 NOTE — Progress Notes (Signed)
Report to PACU, RN, vss, BBS= Clear.  

## 2013-10-18 NOTE — Progress Notes (Signed)
Quick Note:  Hemoglobin almost back to NL  F/u PCP ______

## 2013-10-18 NOTE — Patient Instructions (Addendum)
No polyps or cancer. You do have diverticulosis and melanosis (staining of the colon lining from laxatives)  Will recheck blood count today.  I appreciate the opportunity to care for you. Gatha Mayer, MD, FACG  YOU HAD AN ENDOSCOPIC PROCEDURE TODAY AT [facility]: Refer to the procedure report that was given to you for any specific questions about what was found during the examination.  If the procedure report does not answer your questions, please call your gastroenterologist to clarify.  If you requested that your care partner not be given the details of your procedure findings, then the procedure report has been included in a sealed envelope for you to review at your convenience later.  YOU SHOULD EXPECT: Some feelings of bloating in the abdomen. Passage of more gas than usual.  Walking can help get rid of the air that was put into your GI tract during the procedure and reduce the bloating. If you had a lower endoscopy (such as a colonoscopy or flexible sigmoidoscopy) you may notice spotting of blood in your stool or on the toilet paper. If you underwent a bowel prep for your procedure, then you may not have a normal bowel movement for a few days.  DIET: Your first meal following the procedure should be a light meal and then it is ok to progress to your normal diet.  A half-sandwich or bowl of soup is an example of a good first meal.  Heavy or fried foods are harder to digest and may make you feel nauseous or bloated.  Likewise meals heavy in dairy and vegetables can cause extra gas to form and this can also increase the bloating.  Drink plenty of fluids but you should avoid alcoholic beverages for 24 hours.  ACTIVITY: Your care partner should take you home directly after the procedure.  You should plan to take it easy, moving slowly for the rest of the day.  You can resume normal activity the day after the procedure however you should NOT DRIVE or use heavy machinery for 24  hours (because of the sedation medicines used during the test).    SYMPTOMS TO REPORT IMMEDIATELY: A gastroenterologist can be reached at any hour.  During normal business hours, 8:30 AM to 5:00 PM Monday through Friday, call [phone].  After hours and on weekends, please call the GI answering service at [phone] who will take a message and have the physician on call contact you.   Following lower endoscopy (colonoscopy or flexible sigmoidoscopy):  Excessive amounts of blood in the stool  Significant tenderness or worsening of abdominal pains  Swelling of the abdomen that is new, acute  Fever of 100F or higher  FOLLOW UP: Our staff will call the home number listed on your records the next business day following your procedure to check on you and address any questions or concerns that you may have at that time regarding the information given to you following your procedure. This is a courtesy call and so if there is no answer at the home number and we have not heard from you through the emergency physician on call, we will assume that you have returned to your regular daily activities without incident.  SIGNATURES/CONFIDENTIALITY: You and/or your care partner have signed paperwork which will be entered into your electronic medical record.  These signatures attest to the fact that that the information above on your After Visit Summary has been reviewed and is understood.  Full responsibility of the  confidentiality of this discharge information lies with you and/or your care-partner.  Continue your normal medications  Please read over handout about diverticulosis  Please go to lab before discharge

## 2013-10-19 ENCOUNTER — Telehealth: Payer: Self-pay | Admitting: *Deleted

## 2013-10-19 NOTE — Telephone Encounter (Signed)
  Follow up Call-  Call back number 10/18/2013  Post procedure Call Back phone  # [phone]  Permission to leave phone message Yes     Patient questions:  Do you have a fever, pain , or abdominal swelling? No. Pain Score  0 *  Have you tolerated food without any problems? Yes.    Have you been able to return to your normal activities? Yes.    Do you have any questions about your discharge instructions: Diet   No. Medications  No. Follow up visit  No.  Do you have questions or concerns about your Care? No.  Actions: * If pain score is 4 or above: No action needed, pain <4.

## 2013-11-05 ENCOUNTER — Ambulatory Visit: Payer: Medicare Other | Admitting: Internal Medicine

## 2013-11-26 ENCOUNTER — Telehealth: Payer: Self-pay | Admitting: Pulmonary Disease

## 2013-11-26 DIAGNOSIS — D62 Acute posthemorrhagic anemia: Secondary | ICD-10-CM

## 2013-11-26 DIAGNOSIS — R300 Dysuria: Secondary | ICD-10-CM

## 2013-11-26 NOTE — Telephone Encounter (Signed)
SN please advise of the labs that you would like the pt to have prior to her appt on 10/20 with you.  thanks

## 2013-11-27 NOTE — Telephone Encounter (Signed)
Per SN---  ua and urine culture Cbcd, fe/TIBC, ferritin  i have called the pt and she is aware to come in prior to appt for these labs.  Pt voiced her understanding and nothing further is needed.

## 2013-11-28 ENCOUNTER — Other Ambulatory Visit (INDEPENDENT_AMBULATORY_CARE_PROVIDER_SITE_OTHER): Payer: Medicare Other

## 2013-11-28 DIAGNOSIS — R300 Dysuria: Secondary | ICD-10-CM | POA: Diagnosis not present

## 2013-11-28 DIAGNOSIS — D62 Acute posthemorrhagic anemia: Secondary | ICD-10-CM

## 2013-11-28 LAB — IBC PANEL
IRON: 97 ug/dL (ref 42–145)
SATURATION RATIOS: 24.8 % (ref 20.0–50.0)
TRANSFERRIN: 279.7 mg/dL (ref 212.0–360.0)

## 2013-11-28 LAB — CBC WITH DIFFERENTIAL/PLATELET
BASOS PCT: 1.1 % (ref 0.0–3.0)
Basophils Absolute: 0.1 10*3/uL (ref 0.0–0.1)
EOS ABS: 0.1 10*3/uL (ref 0.0–0.7)
EOS PCT: 1.6 % (ref 0.0–5.0)
HCT: 37.6 % (ref 36.0–46.0)
Hemoglobin: 12.5 g/dL (ref 12.0–15.0)
LYMPHS PCT: 27.3 % (ref 12.0–46.0)
Lymphs Abs: 1.3 10*3/uL (ref 0.7–4.0)
MCHC: 33.3 g/dL (ref 30.0–36.0)
MCV: 90.3 fl (ref 78.0–100.0)
Monocytes Absolute: 0.4 10*3/uL (ref 0.1–1.0)
Monocytes Relative: 7.5 % (ref 3.0–12.0)
Neutro Abs: 2.9 10*3/uL (ref 1.4–7.7)
Neutrophils Relative %: 62.5 % (ref 43.0–77.0)
Platelets: 238 10*3/uL (ref 150.0–400.0)
RBC: 4.16 Mil/uL (ref 3.87–5.11)
RDW: 13.7 % (ref 11.5–15.5)
WBC: 4.7 10*3/uL (ref 4.0–10.5)

## 2013-11-28 LAB — FERRITIN: Ferritin: 18.6 ng/mL (ref 10.0–291.0)

## 2013-11-29 LAB — URINALYSIS
Bilirubin Urine: NEGATIVE
Hgb urine dipstick: NEGATIVE
Ketones, ur: NEGATIVE
Leukocytes, UA: NEGATIVE
NITRITE: NEGATIVE
PH: 6.5 (ref 5.0–8.0)
Specific Gravity, Urine: 1.01 (ref 1.000–1.030)
Total Protein, Urine: NEGATIVE
URINE GLUCOSE: NEGATIVE
Urobilinogen, UA: 0.2 (ref 0.0–1.0)

## 2013-11-30 LAB — URINE CULTURE
Colony Count: NO GROWTH
ORGANISM ID, BACTERIA: NO GROWTH

## 2013-12-04 ENCOUNTER — Encounter: Payer: Self-pay | Admitting: Pulmonary Disease

## 2013-12-04 ENCOUNTER — Ambulatory Visit (INDEPENDENT_AMBULATORY_CARE_PROVIDER_SITE_OTHER): Payer: Medicare Other | Admitting: Pulmonary Disease

## 2013-12-04 VITALS — BP 120/64 | HR 82 | Temp 97.8°F | Ht 63.0 in | Wt 133.2 lb

## 2013-12-04 DIAGNOSIS — K219 Gastro-esophageal reflux disease without esophagitis: Secondary | ICD-10-CM | POA: Diagnosis not present

## 2013-12-04 DIAGNOSIS — J449 Chronic obstructive pulmonary disease, unspecified: Secondary | ICD-10-CM | POA: Diagnosis not present

## 2013-12-04 DIAGNOSIS — K5901 Slow transit constipation: Secondary | ICD-10-CM

## 2013-12-04 DIAGNOSIS — M150 Primary generalized (osteo)arthritis: Secondary | ICD-10-CM

## 2013-12-04 DIAGNOSIS — D62 Acute posthemorrhagic anemia: Secondary | ICD-10-CM

## 2013-12-04 DIAGNOSIS — IMO0001 Reserved for inherently not codable concepts without codable children: Secondary | ICD-10-CM

## 2013-12-04 DIAGNOSIS — E7800 Pure hypercholesterolemia, unspecified: Secondary | ICD-10-CM

## 2013-12-04 DIAGNOSIS — M791 Myalgia: Secondary | ICD-10-CM

## 2013-12-04 DIAGNOSIS — M159 Polyosteoarthritis, unspecified: Secondary | ICD-10-CM

## 2013-12-04 DIAGNOSIS — M609 Myositis, unspecified: Secondary | ICD-10-CM

## 2013-12-04 DIAGNOSIS — F411 Generalized anxiety disorder: Secondary | ICD-10-CM

## 2013-12-04 DIAGNOSIS — M810 Age-related osteoporosis without current pathological fracture: Secondary | ICD-10-CM

## 2013-12-04 MED ORDER — OMEPRAZOLE 40 MG PO CPDR: DELAYED_RELEASE_CAPSULE | ORAL | Status: DC

## 2013-12-04 NOTE — Patient Instructions (Signed)
Today we updated your med list in our EPIC system...    Continue your current medications the same...  We wrote a new prescription for OMEPRAZOLE 40mg  - take one tab 30 min before the first meal of the day    You may continue to take the FAMOTIDINE (Pepcid) 20mg  at bedtime...  To help your dizziness & balance> I suggest some yoga, walking, physical exercise as tolerated...    You may use the MECLIZINE 25mg  up to 4 times daily as needed for the dizziness    And try the EPLEY maneuver (check it out on You-tube & the internet) to help your inner ear chrystals...  Call for any questions...  Let's plan a follow up visit in 22mo, sooner if needed for problems.Marland KitchenMarland Kitchen

## 2013-12-04 NOTE — Progress Notes (Signed)
Subjective:    Patient ID: Lisa King, female    DOB: 1932/01/06, 80 y.o.   MRN: [id number]  HPI 80 y/o WF here for a follow up visit... she has mult med problems as noted...  Followed for general medical purposes w/ hx of COPD, Hyperchol, GERD/ Diverics/ Polyps, DJD/ FM/ Osteoporosis, and anxiety... she likes to take various supplements, and she is also followed by Lisa King for GI, and Lisa King for Ortho... ~  SEE PREV EPIC NOTES FOR OLDER DATA>>  ~  August 21, 2010:  Yearly ROV & she states "I quit smoking but I cheat now & again";  She suffered a dog bite to left ankle 5/12 & still has some discomfort but wound is healed etc;  She notes some sinus drainage, but denies cough, phlegm, hemoptysis, CP, palpit, SOB, or edema;  We discussed her severe osteoporosis & the signif risk it imposes> she has refuses meds, endocrine consult, & hasn't f/u w/ GYN; we reviewed avail options & she has agreed to try ATELVIA 35mg /wk as this is supposed to be easier on the GI track...    CXR today is stable, NAD; and recent blood work looked good (see below)>>  ~  August 25, 2011:  Yearly ROV & Lisa King says she fell 2 wks ago (trying to kill a spider) w/ some chest wall discomfort & requests a muscle relaxer; she still smokes a cig "now & then" and has underlying COPD but declines regular inhalers,etc; fortunately she has avoided major upper resp infections & denies cough, sputum, ch in SOB/DOE etc; she gets a little exercise by walking...     We reviewed prob list, meds, xrays and labs> see below for updates>>  CXR 7/13 showed heart at the upper lim of norm in size, lungs are clear/ overinflated c/w COPD, apical pleural thickening, NAD...  LABS 7/13:  FLP- not at goals on diet alone w/ TChol 219 LDL=115;  Chems- wnl;  CBC- wnl;  TSH=1.38;  VitD=52    ~  August 31, 2012:  86yr ROV & Lisa King describes a good yr- just c/o some insomnia & arthritis pain... We reviewed the following medical problems during today's office  visit >>     Hearing Loss> she had f/u appt w/ drBates ENT 3/14 & cerumen impactions removed...    COPD, Cig smoker> states her last cig was 02/15/12; still refuses breathing meds; states her breathing is good, notes better energy, & denies cough, sput, hemoptysis, SOB, etc; she is too sedentary & needs to incr exercise...    CHOL> on Fish Oil; FLP is improved on diet alone- TChol 191, TG 107, HDL 61, LDL 109     GI- GERD, Divertics, Polyps> on OTC H2Blocker like Pepcid/Zantac, Miralax, Colace; she is due for a follow up colonoscopy this yr & we will refer...    DJD, FM> on Anaprox220Bid prn, Flexeril10Tid prn, BorageOil & "tart cherry advanced"; states that air conditioning makes her neck arthritis worse; advised heating pad & anti-inflamm meds...    Osteoporosis> on Calcium, MVI; BMD was -3.5 in spine in 2007 & she has repeatedly refused f/u BMD, Bisphos med rx, or alternative med rx or endocrine consultation; only agrees to take Calcium & Vits despite the potential severe consequences...    Anxiety, Insomnia> on Tranxene15-1/2Tid [facility] changed her to the 15mg  tabs), Tofranil25-2Qhs...  We reviewed prob list, meds, xrays and labs> see below for updates >>   LABS 7/14:  FLP- improved on diet alone;  Chems- wnl;  CBC- ok w/ Hg=11.7 MCV=93;  TSH=1.06;  VitD=61  ~  September 03, 2013:  Yearly ROV & post hospital f/u visit> Lisa King was [facility] by [facility] 7/9 - 08/25/13 w/ a lower GIB believed to be a self-limited divertic bleed that resolved on it's own;  Hx GERD, divertics, polyps, constip, and hems;  Followed by Lisa King on Famotidine20, & Miralax, last colon 10/09 showed divertics and several polyps (adenomatous & hyperplastic), f/u suggested in 18yrs & now overdue;  She presented w/ rectal bleeding- dark red blood- but no abd pain, no n/v, not lightheaded or syncopal, hemodynamically stable w/o hypotension & Hg in the 9-10 range;  She was seen by Lisa King who rec conservative approach & outpt f/u to arrange for  her needed colonoscopy...    Hearing Loss> she has hearing aides from [facility] & prev cerumen impactions removed...    COPD, ex Cig smoker> states her last cig was 02/15/12; still refuses breathing meds; states her breathing is good, notes better energy, & denies cough, sput, hemoptysis, SOB, etc; she is too sedentary & needs to incr exercise...    CHOL> on Fish Oil; FLP 7/14 was improved on diet alone- TChol 191, TG 107, HDL 61, LDL 109... She is not fasting for f/u labs today.    GI- GERD, Divertics, constip, Polyps, hems> on OTC Pepcid20 (refused to take the Omep40) & Miralax; she did not set up her colonoscopy due in 2014; Adm 7/15 w/ divertic bleed, self-lim, hemodynam stable, Hg nadir= 8.8; placed on Fe, Prilosec, Miralax & asked to stop ASA, NSAIDs; she refuses Fe due to constip, refuses Prilosec ("I take Pepcid"), still taking her ASA; asked to try Ferrosequels, Miralax daily, Senakot-S Qhs, and f/u w/ GI- appt sched.    GU> she had a UTI w/ [facility] 7/15 Hosp; treated w/ Levaquin...     DJD, FM> prev on Anaprox220Bid prn, Flexeril10Tid prn, BorageOil & "tart cherry advanced"; states that air conditioning makes her neck arthritis worse; advised heating pad & anti-inflamm meds prn...    Osteoporosis> on Calcium, MVI; BMD was -3.5 in spine in 2007 & she has repeatedly refused f/u BMD, Bisphos med rx, or alternative med rx or endocrine consultation; only agrees to take Calcium & Vits despite the potential severe consequences...    Anxiety, Insomnia> on Tranxene7.5-1/2Tid prn, Tofranil25-2Qhs...     Anemia> Lower GIB 7/15 believed due to divertics; Hg nadir= 8.8, she is INTOL to Fe w/ constip, rec to take [facility] + Miralax/ Senakot-S... We reviewed prob list, meds, xrays and labs> see below for updates >>   EKG 7/15 showed NSR, rate75, inferior scar & abn r progression...   LABS in hosp 7/15:  Chems- wnl;  CBC- Hg in the 9/10 range;  UA w/ +WBC, RBC, bact & Cult +EColi sens  Levaquin...  LABS in office 7/15:  CBC- Hg=9.8, Fe=61 (17%);  TSH=0.95;  Vit B12= 588;  Vit D= 80...   ~  December 04, 2013:  43mo ROV & Syanne turned 80 y/o- stable overall w/ mult somatic complaints; c/o giat abn & balance issues w/ dizziness and intermittent BPPV symptoms- she has Meclizine for prn use 7 we discussed the ELPEY maneuver to help as well, reminded to continue ASA81/d... We reviewed the following medical problems during today's office visit >>     Hearing Loss> she has hearing aides from [facility] & prev cerumen impactions removed; I inspected EACs & they are clear today...    COPD, ex Cig smoker> states  her last cig was 02/15/12; still refuses breathing meds; states her breathing is good, notes energy fair, & denies cough, sput, hemoptysis, SOB, etc; she is too sedentary & needs to incr exercise...    CHOL> on Fish Oil; FLP 7/14 was improved on diet alone- TChol 191, TG 107, HDL 61, LDL 109... She is not fasting for f/u labs today...    GI- GERD, Divertics, Constip, Polyps, hems> on OTC Pepcid20 (prev refused to take the Omep40) & Miralax/ Senakot-S/ Suppos; Adm 7/15 w/ divertic bleed, self-lim, hemodynam stable, Hg nadir= 8.8; placed on Fe, Prilosec, Miralax & asked to stop ASA, NSAIDs; she refuses Fe due to constip, refuses Prilosec ("I take Pepcid"), still taking her ASA; asked to try Ferrosequels, Miralax daily, Senakot-S Qhs, Suppos prn; she had f/u Colonoscopy 9/15 Lisa King- mod divertics, melanosis, redundant colon, no polyps...     GU> she had a UTI w/ [facility] 7/15 Hosp; treated w/ Levaquin & resolved...     DJD, FM> on Aleve prn, Flexeril10Tid prn, BorageOil & "tart cherry advanced"; states that air conditioning makes her neck arthritis worse; advised heating pad & anti-inflamm meds prn only...    Osteoporosis> on Calcium, MVI; BMD was -3.5 in spine in 2007 & she has repeatedly refused f/u BMD, Bisphos med rx, or alternative med rx or endocrine consultation; only agrees to  take Calcium & Vits despite the potential severe consequences!!!    Anxiety, Insomnia> on Tranxene7.5-1/2Tid prn, Tofranil25-2Qhs...     Anemia> Lower GIB 7/15 believed due to divertics; Hg nadir= 8.8, she is INTOL to Fe w/ constip, rec to take [facility] + Miralax/ Senakot-S/ etc... We reviewed prob list, meds, xrays and labs> see below for updates >>   LABS 10/15:  CBC- Hg=12.5, MCV=90, Fe=97 (25%sat), Ferritin=18.6.Marland KitchenMarland Kitchen          Problem List:   HEARING LOSS (ICD-389.9) - hx bilat stapes surg for hearing problems by DrKraus yrs ago... states she can't have MRI's due to the stapes implants... now sees DrBates w/ hearing aides bilat... ~  3/14:  She had f/u DrBates> URI & cerumen impactions- removed + conservative therapy...  ~  7/15:  She has hearing aides from ENT...  COPD (ICD-496) - states her breathing is OK and she denies SOB, and declines use of inhalers, ex= walking... She denies cough, sputum, hemoptysis, CP, palpit SOB, edema... she has declined to do PFT's in the past... ~  CXR 7/11 showed COPD, atherosclerotic calcif in Ao, osteopenic bones, compression of 2 mid thor vertebral bodies, NAD.Marland Kitchen. ~  CXR 7/12 showed hyperinflation, some apical pleural scarring, sm HH, mitral annular calcif, NAD.Marland Kitchen. ~  CXR 7/13 showed heart at the upper lim of norm in size, lungs are clear/ overinflated c/w COPD, apical pleural thickening, NAD...  CIGARETTE SMOKER (ICD-305.1) - states she's quit smoking now "but I cheat now & again"... she refuses Chantix stating that a friend tried it and died!!! We discussed other smoking cessation strategies... ~  7/13:  She states "I don't buy them anymore" but smokes now & then when she can get them... ~  States she quit 02/15/12 & denies on-going resp symptoms of cough, sput, hemoptysis, CP, dyspnea, edema, etc...  HYPERCHOLESTEROLEMIA (ICD-272.0) - she has refused statin Rx in the past... tried Simvastatin 20mg /d 5/09 but states that she developed "muscle cramps,  throat closing & insomnia" which resolved off this med- refuses to try other meds "I'm on Fish Oil & diet"... ~  FLP 11/07 showed Tchol 214, TG 130,  HDL 51, LDL 125... refused statin, perfers diet alone... ~  FLP 5/09 showed TChol 228, TG 200, HDL 48, LDL 139... rec- start Simva20... pt DC'd it. ~  FLP 7/10 showed TChol 219, TG 171, HDL 54, LDL 127... refuses Fibrate or other lipid meds. ~  FLP 6/11 showed TChol 234, TG 166, HDL 55, LDL 133... discussed diet Rx. ~  FLP 6/12 showed TChol 196, TG 134, HDL 60, LDL 110... Improved on diet Rx! ~  FLP 7/13 showed TChol 219, TG 139, HDL 68, LDL 115... Offered low dose statin but she declines... ~  FLP 7/14 on diet alone showed TChol 191, TG 107, HDL 61, LDL 109... Improved on diet alone.  GERD (ICD-530.81)  -  on PEPCID 20mg Qd but she is adamant that FAMOTIDINE works much better than Pepcid... followed for GI by Lisa King...  DIVERTICULOSIS OF COLON (ICD-562.10) - she denies D, C, blood, ch in bowel habits... COLONIC POLYPS (ICD-211.3) - colonoscopy 7/06 showed several 4-40mm adenomatous polyps in a long redundant colon w/ divertics & hems... f/u colonoscopy 10/09 showed divertics, several polyps= adenomatous & hyperpl w/ f/u colon planned for 45yrs... HEMORRHOIDS (ICD-455.6) ~  2014> she is due for a f/u w/ Lisa King re: f/u colonoscopy => she never sched this f/u visit... ~  7/15: Hosp by [facility] w/ Lower GIB believed to be a diverticv bleed, self limited, hemodynamically stable, Hg~9 range, & set up for outpt f/u by [employer]... ~  Colonoscopy 9/15 by Lisa King showed mod sigmoid divertics, no polyps, extensive melanosis, redundant colon...  ~  10/15: on Pepcid20, not taking Omep; on Miralax prn & advised regular dosing, plus Senakot-S Qhs, and suppos prn; her CC is arthritis and still takes NSAIDs regularly- advised prn dosing & regular Omeprazole40 30 min before the 1st meal each day...  DEGENERATIVE JOINT DISEASE (ICD-715.90) - hx of leg pain w/ norm  ABI's 2007... eval by Janett Billow for Ortho w/ "arthritis and scoliosis" treated w/ muscle relaxer and improved... she states she can't get MRI due to prev stapes surg... ~  7/11: she notes that black cherry concentrate is good "it took a freind's pain away"... ~  Arthritis is her CC> on Aleve regularly + Tylenol prn; advised intermittwent prn dosing f NSAIDs and use the Omep40 daily...  FIBROMYALGIA (ICD-729.1) - on IMIPRAMINE 25mg - 2tabs daily, + Flexeril10Tid prn... ~  7/12:  She notes that her FM pain was exac by the dog bite 5/12...  OSTEOPOROSIS (ICD-733.00) - known severe osteoporosis w/ BMD 11/07 showing TScores -3.5 in spine & -2.6 in left fem neck... pt states intol to [facility] and refused other bisphosphonates or miacalcin... in addition she has refuses Endocrine referral to discuss further... "I take calcium and vitamins" but I carefully explained how that is not sufficient and that she is likely to suffer a serious compression fracture of her sp, or a fall could easily fracture her hip- she refuses Rx or further help... I have advised her to talk to her GYN about this as well. ~  7/10:  We discussed osteoporosis options again including bisphos, reclast, miacalcin, forteo, & the new prolia- she is not interested in Rx or in Endocrine second opinion... ~  7/11:  Ditto... ~  7/12:  I reviewed all the above & offered the new ATELVIA 35mg /wk (fewer GI problems) and she agreed to try it, Rx written==> stopped on her own. ~  7/13:  We reviewed the situation w/ her bones, osteoporosis & her risks; she again refuses meds for  this problem, and declines offers for subspec referral (Endocrine, etc)... ~  7/14:  She once again confirms her staunch refusal to consider f/u BMD, med rx of any kind for her osteoporosis, or 2nd opinion consultation... ~  Labs 7/15 showed Vit D = 80...  ANXIETY (ICD-300.00)- she takes Tranxene7.5 at bedtime to help her sleep...  ANEMIA>  She was [facility] 7/15 w/ Lower GIB  believed to be due to divertics, managed conservatively & referred to GI as outpt for f/u... ~  Baseline labs showed Hg ~12 range... ~  Labs 7/15 in hosp showed Hg= 9-10 range w/ nadir=8.8; placed on Fe at disch but poorly tolerated.... ~  Labs 7/15 in office f/u showed Hg= 9.8, Fe= 61 (17%sat), B12= 588... rec to try Ferrosequels... ~  Labs 10/15 showed Hg= 12.5, Fe= 97 (25%sat), Ferritin= 19... 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 she takes ASA 81mg /d... had Tetanus shot 1997 & Pneumovax 2000... she refuses Flu shots! ~  Supplement meds include> Calc w/ D, MVI, Tums, FishOil, BorageOil, FlaxSeedOil, etc... ~  10/15: pt asked to f/u in 1mo w/ Fasting labs, but she refused- wants routine ROV in 77yr...   Past Surgical History  Procedure Laterality Date  . Tonsillectomy and adenoidectomy      as a child  . Stapes surgery      Dr Dorma Russell  . Cataract extraction, bilateral  2009  . Colonoscopy  2009, 2006     Outpatient Encounter Prescriptions as of 12/04/2013  Medication Sig  . aspirin 81 MG tablet Take 81 mg by mouth daily.  . Calcium Carbonate-Vitamin D (CALTRATE 600+D) 600-400 MG-UNIT per tablet Take 1 tablet by mouth daily.    . cholecalciferol (VITAMIN D) 1000 UNITS tablet Take 1,000 Units by mouth daily.  . clorazepate (TRANXENE) 7.5 MG tablet Take 1 tablet (7.5 mg total) by mouth 2 (two) times daily as needed for anxiety (anxiety).  . cyclobenzaprine (FLEXERIL) 10 MG tablet Take 10 mg by mouth 3 (three) times daily as needed for muscle spasms.  Marland Kitchen docusate sodium (COLACE) 100 MG capsule Take 100 mg by mouth daily.   . famotidine (PEPCID) 20 MG tablet TAKE ONE TABLET BY MOUTH ONCE DAILY  . fish oil-omega-3 fatty acids 1000 MG capsule Take 2 g by mouth daily.    Marland Kitchen imipramine (TOFRANIL) 25 MG tablet take 2 tablets by mouth at bedtime  . levofloxacin (LEVAQUIN) 250 MG tablet   . Misc Natural Products (TART CHERRY ADVANCED) CAPS Take 1  capsule by mouth daily.  . naproxen sodium (ANAPROX) 220 MG tablet Take 220 mg by mouth at bedtime.  Marland Kitchen omeprazole (PRILOSEC) 40 MG capsule Take 40 mg by mouth daily.  . polyethylene glycol (MIRALAX / GLYCOLAX) packet Take 17 g by mouth daily.    . sennosides-docusate sodium (SENOKOT-S) 8.6-50 MG tablet Take 1 tablet by mouth daily.    Allergies  Allergen Reactions  . Penicillins Anaphylaxis  . Bisphosphonates Other (See Comments)    [name]  . Simvastatin     Unknown   . Sulfonamide Derivatives Rash and Other (See Comments)    blisters    Current Medications, Allergies, Past Medical History, Past Surgical History, Family History, and Social History were reviewed in [employer] record.   Review of Systems       See HPI - all other systems neg except as noted...       The patient complains of  dyspnea on exertion, severe indigestion/heartburn, muscle weakness, and difficulty walking.  The patient denies anorexia, fever, weight loss, weight gain, vision loss, decreased hearing, hoarseness, chest pain, syncope, peripheral edema, prolonged cough, headaches, hemoptysis, abdominal pain, melena, hematochezia, hematuria, incontinence, suspicious skin lesions, transient blindness, depression, unusual weight change, abnormal bleeding, enlarged lymph nodes, and angioedema.     Objective:   Physical Exam    WD, WN, 80 y/o WF in NAD... GENERAL:  Alert & oriented; pleasant & cooperative... HEENT:  [place]/AT, EOM-wnl, PERRLA, EACs-clear, TMs-wnl, NOSE-clear, THROAT-clear & wnl. NECK:  Supple w/ fairROM; no JVD; normal carotid impulses w/o bruits; no thyromegaly or nodules palpated; no lymphadenopathy. CHEST:  decr BS bilat, clear to P & A; without wheezes/ rales/ or rhonchi heard... HEART:  Regular Rhythm; without murmurs/ rubs/ or gallops detected... ABDOMEN:  Soft & nontender; normal bowel sounds; no organomegaly or masses palpated... EXT: without deformities, mod  arthritic changes; no varicose veins/ +venous insuffic/ no edema. NEURO:  CN's intact; no focal neuro deficits... DERM:  No lesions noted; prev nasal skin surg; no rash etc...  RADIOLOGY DATA:  Reviewed in the EPIC EMR & discussed w/ the patient...  LABORATORY DATA:  Reviewed in the EPIC EMR & discussed w/ the patient...   Assessment & Plan:    COPD>  States she's quit smoking but stiil smokes some (cheating) & advised to quit completely... Denies SOB etc but she is clearly too sedentary & asked to incr activity/ exercise...  CHOL>  On diet alone & recent FLP is improved & close tyo goals...   GI>  GERD/ Divertics/ Polyps>  Followed by Lisa King for GI; stable on Famotidine which she swears works better that [employer]; due for 23yr f/u colon in 2014... GIB, likely divertic bleed 7/15> self limited, feeling better, c/o chr constip & rec to take Miralax & Senakot-S (but she wants Linzess- like her brother); referred to GI for f/u eval & to sched colon=> done 9/15 [facility]-  divertics, melanosis, redundant colon...  DJD/ FM>  Stable overall; uses OTC anti-inflamm as needed + Imipramine Qhs...  Osteoporosis/ Compression fx>  She has 2 compression fx that are old, she is very high risk, we had another long conversation about her osteoporosis & the risk it imposes, we reviewed the avail therapies (again) but she declines all avail therapies & refuses second opinion consult etc...   Anxiety>  She uses the Chlorazepate as needed...  Anemia> due to GIB; f/u Hg=9.8, Fe=61, and rec to take FERROSEQUELS=>  Follow up labs 10/15 w/ Hg=12.5, Fe=97...   Patient's Medications  New Prescriptions   No medications on file  Previous Medications   ASPIRIN 81 MG TABLET    Take 81 mg by mouth daily.   CALCIUM CARBONATE-VITAMIN D (CALTRATE 600+D) 600-400 MG-UNIT PER TABLET    Take 1 tablet by mouth daily.     CHOLECALCIFEROL (VITAMIN D) 1000 UNITS TABLET    Take 1,000 Units by mouth daily.   CLORAZEPATE  (TRANXENE) 7.5 MG TABLET    Take 1 tablet (7.5 mg total) by mouth 2 (two) times daily as needed for anxiety (anxiety).   CYCLOBENZAPRINE (FLEXERIL) 10 MG TABLET    Take 10 mg by mouth 3 (three) times daily as needed for muscle spasms.   DOCUSATE SODIUM (COLACE) 100 MG CAPSULE    Take 100 mg by mouth daily.    FAMOTIDINE (PEPCID) 20 MG TABLET    TAKE ONE TABLET BY MOUTH ONCE DAILY   FISH OIL-OMEGA-3 FATTY ACIDS 1000  MG CAPSULE    Take 2 g by mouth daily.     IMIPRAMINE (TOFRANIL) 25 MG TABLET    take 2 tablets by mouth at bedtime   LEVOFLOXACIN (LEVAQUIN) 250 MG TABLET       MISC NATURAL PRODUCTS (TART CHERRY ADVANCED) CAPS    Take 1 capsule by mouth daily.   NAPROXEN SODIUM (ANAPROX) 220 MG TABLET    Take 220 mg by mouth at bedtime.   POLYETHYLENE GLYCOL (MIRALAX / GLYCOLAX) PACKET    Take 17 g by mouth daily.     SENNOSIDES-DOCUSATE SODIUM (SENOKOT-S) 8.6-50 MG TABLET    Take 1 tablet by mouth daily.  Modified Medications   Modified Medication Previous Medication   OMEPRAZOLE (PRILOSEC) 40 MG CAPSULE omeprazole (PRILOSEC) 40 MG capsule      Take 30 minutes before the first meal of the day    Take 40 mg by mouth daily.  Discontinued Medications   No medications on file

## 2013-12-10 ENCOUNTER — Telehealth: Payer: Self-pay | Admitting: Pulmonary Disease

## 2013-12-10 MED ORDER — MECLIZINE HCL 25 MG PO TABS: 25.0000 mg | ORAL_TABLET | Freq: Four times a day (QID) | ORAL | Status: DC | PRN

## 2013-12-10 MED ORDER — OMEPRAZOLE 40 MG PO CPDR: 40.0000 mg | DELAYED_RELEASE_CAPSULE | Freq: Every day | ORAL | Status: DC

## 2013-12-10 NOTE — Telephone Encounter (Signed)
Spoke with pt-- states that when she picked up her Omeprazole 40mg  from the pharmacy there was only 3 pills in the bottle and her Meclizine 25mg  was not there. After looking through patient record it looks as though this was mistake on our behalf. Only 3 pills were sent to the pharmacy for the Omeprazole and the Meclizine was not sent.  Pt advised that the remaining 27 pills have been sent to her pharmacy along with her Meclizine Rx Patient Instructions     Today we updated your med list in our EPIC system...  Continue your current medications the same...  We wrote a new prescription for OMEPRAZOLE 40mg  - take one tab 30 min before the first meal of the day  You may continue to take the FAMOTIDINE (Pepcid) 20mg  at bedtime...  To help your dizziness & balance> I suggest some yoga, walking, physical exercise as tolerated...  You may use the MECLIZINE 25mg  up to 4 times daily as needed for the dizziness  And try the EPLEY maneuver (check it out on You-tube & the internet) to help your inner ear chrystals...  Call for any questions...  Let's plan a follow up visit in 58mo, sooner if needed for problems...    Nothing further needed.

## 2014-01-08 ENCOUNTER — Telehealth: Payer: Self-pay | Admitting: Pulmonary Disease

## 2014-01-08 ENCOUNTER — Other Ambulatory Visit (INDEPENDENT_AMBULATORY_CARE_PROVIDER_SITE_OTHER): Payer: Medicare Other

## 2014-01-08 DIAGNOSIS — R300 Dysuria: Secondary | ICD-10-CM

## 2014-01-08 LAB — URINALYSIS, ROUTINE W REFLEX MICROSCOPIC
Bilirubin Urine: NEGATIVE
Ketones, ur: NEGATIVE
Nitrite: NEGATIVE
SPECIFIC GRAVITY, URINE: 1.01 (ref 1.000–1.030)
Urine Glucose: NEGATIVE
Urobilinogen, UA: 0.2 (ref 0.0–1.0)
pH: 7.5 (ref 5.0–8.0)

## 2014-01-08 MED ORDER — CIPROFLOXACIN HCL 250 MG PO TABS: 250.0000 mg | ORAL_TABLET | Freq: Two times a day (BID) | ORAL | Status: DC

## 2014-01-08 NOTE — Addendum Note (Signed)
Addended by: Parke Poisson E on: 01/08/2014 04:27 PM   Modules accepted: Orders

## 2014-01-08 NOTE — Telephone Encounter (Signed)
Routing message to TP to address Please advise, thank you.

## 2014-01-08 NOTE — Telephone Encounter (Signed)
Per TP: okay to come leave urine sample.  Would be great if she could come asap so that we can go ahead and have the results soon.  Push fluids.  Thanks.  Orders placed STAT.  Called spoke with patient to discuss the above.  Pt stated that she does not want to come to the office today for urine specimen and would like to come tomorrow.  Advised pt would be best if she could come TODAY so that her results can be obtained as soon as possible and that the office closes early tomorrow for the holiday.  Pt also stated that she had just used to bathroom but will push fluids.  She would like to obtain sample at home but does not have sterile specimen cup - advised pt that sterile specimen cup is necessary.  Pt voiced her understanding.  Will sign off and look for lab results.

## 2014-01-08 NOTE — Telephone Encounter (Addendum)
Results viewed by TP: Cipro 250mg  twice daily x7days, no refills.  Call the office if symptoms do not improve or worsen. Called spoke with patient, she is aware of the above Nothing further needed; will sign off

## 2014-01-08 NOTE — Telephone Encounter (Signed)
Pt c/o burning on urination, arms and hands hurt when urinating, dark color to urine.  Denies fever, back pain or odor to urine.  Please advise if pt needs to come for UA/ C& S.  Sending to doc of day.  Allergies  Allergen Reactions  . Penicillins Anaphylaxis  . Bisphosphonates Other (See Comments)    [name]  . Simvastatin     Unknown   . Sulfonamide Derivatives Rash and Other (See Comments)    blisters    Current Outpatient Prescriptions on File Prior to Visit  Medication Sig Dispense Refill  . aspirin 81 MG tablet Take 81 mg by mouth daily.    . Calcium Carbonate-Vitamin D (CALTRATE 600+D) 600-400 MG-UNIT per tablet Take 1 tablet by mouth daily.      . cholecalciferol (VITAMIN D) 1000 UNITS tablet Take 1,000 Units by mouth daily.    . clorazepate (TRANXENE) 7.5 MG tablet Take 1 tablet (7.5 mg total) by mouth 2 (two) times daily as needed for anxiety (anxiety). 30 tablet 5  . cyclobenzaprine (FLEXERIL) 10 MG tablet Take 10 mg by mouth 3 (three) times daily as needed for muscle spasms.    Marland Kitchen docusate sodium (COLACE) 100 MG capsule Take 100 mg by mouth daily.     . famotidine (PEPCID) 20 MG tablet TAKE ONE TABLET BY MOUTH ONCE DAILY 30 tablet 6  . fish oil-omega-3 fatty acids 1000 MG capsule Take 2 g by mouth daily.      Marland Kitchen imipramine (TOFRANIL) 25 MG tablet take 2 tablets by mouth at bedtime    . levofloxacin (LEVAQUIN) 250 MG tablet     . meclizine (ANTIVERT) 25 MG tablet Take 1 tablet (25 mg total) by mouth 4 (four) times daily as needed for dizziness. 30 tablet 0  . Misc Natural Products (TART CHERRY ADVANCED) CAPS Take 1 capsule by mouth daily.    . naproxen sodium (ANAPROX) 220 MG tablet Take 220 mg by mouth at bedtime.    Marland Kitchen omeprazole (PRILOSEC) 40 MG capsule Take 30 minutes before the first meal of the day 3 capsule 6  . omeprazole (PRILOSEC) 40 MG capsule Take 1 capsule (40 mg total) by mouth daily. 27 capsule 0  . polyethylene glycol (MIRALAX / GLYCOLAX) packet Take 17 g  by mouth daily.      . sennosides-docusate sodium (SENOKOT-S) 8.6-50 MG tablet Take 1 tablet by mouth daily.     No current facility-administered medications on file prior to visit.

## 2014-01-12 LAB — URINE CULTURE: Colony Count: 50000

## 2014-01-16 ENCOUNTER — Telehealth: Payer: Self-pay | Admitting: Pulmonary Disease

## 2014-01-16 DIAGNOSIS — R300 Dysuria: Secondary | ICD-10-CM

## 2014-01-16 NOTE — Telephone Encounter (Signed)
Called and spoke with pt and she is aware of order placed for the UA per her last labs from [facility].  Pt is aware and will come in for this test tomorrow.

## 2014-01-17 ENCOUNTER — Other Ambulatory Visit (INDEPENDENT_AMBULATORY_CARE_PROVIDER_SITE_OTHER): Payer: Medicare Other

## 2014-01-17 DIAGNOSIS — R300 Dysuria: Secondary | ICD-10-CM | POA: Diagnosis not present

## 2014-01-17 LAB — URINALYSIS
Bilirubin Urine: NEGATIVE
Hgb urine dipstick: NEGATIVE
KETONES UR: NEGATIVE
Leukocytes, UA: NEGATIVE
NITRITE: NEGATIVE
PH: 6.5 (ref 5.0–8.0)
Specific Gravity, Urine: <=1.005 — AB (ref 1.000–1.030)
TOTAL PROTEIN, URINE-UPE24: NEGATIVE
Urine Glucose: NEGATIVE
Urobilinogen, UA: 0.2 (ref 0.0–1.0)

## 2014-01-21 NOTE — Progress Notes (Signed)
Quick Note:  Pt has already been notified ______

## 2014-03-14 ENCOUNTER — Other Ambulatory Visit: Payer: Self-pay | Admitting: Pulmonary Disease

## 2014-03-25 ENCOUNTER — Other Ambulatory Visit: Payer: Self-pay | Admitting: Pulmonary Disease

## 2014-04-12 ENCOUNTER — Other Ambulatory Visit: Payer: Self-pay | Admitting: Pulmonary Disease

## 2014-05-15 ENCOUNTER — Other Ambulatory Visit: Payer: Self-pay | Admitting: Pulmonary Disease

## 2014-05-30 ENCOUNTER — Other Ambulatory Visit: Payer: Self-pay | Admitting: Pulmonary Disease

## 2014-06-13 ENCOUNTER — Other Ambulatory Visit: Payer: Self-pay | Admitting: Pulmonary Disease

## 2014-06-14 DIAGNOSIS — L245 Irritant contact dermatitis due to other chemical products: Secondary | ICD-10-CM | POA: Diagnosis not present

## 2014-06-14 DIAGNOSIS — L578 Other skin changes due to chronic exposure to nonionizing radiation: Secondary | ICD-10-CM | POA: Diagnosis not present

## 2014-06-17 DIAGNOSIS — H524 Presbyopia: Secondary | ICD-10-CM | POA: Diagnosis not present

## 2014-06-17 DIAGNOSIS — H26491 Other secondary cataract, right eye: Secondary | ICD-10-CM | POA: Diagnosis not present

## 2014-06-17 DIAGNOSIS — Z961 Presence of intraocular lens: Secondary | ICD-10-CM | POA: Diagnosis not present

## 2014-07-11 ENCOUNTER — Other Ambulatory Visit: Payer: Self-pay | Admitting: Pulmonary Disease

## 2014-07-16 ENCOUNTER — Telehealth: Payer: Self-pay | Admitting: Pulmonary Disease

## 2014-07-16 ENCOUNTER — Other Ambulatory Visit: Payer: Self-pay | Admitting: Pulmonary Disease

## 2014-07-16 MED ORDER — IMIPRAMINE HCL 25 MG PO TABS: 25.0000 mg | ORAL_TABLET | Freq: Every day | ORAL | Status: DC

## 2014-07-16 NOTE — Telephone Encounter (Signed)
Per 12/04/13 OV: Return in about 6 months (around 06/05/2014). --   Pt scheduled an appt to see SN and refill sent in. Nothing further needed

## 2014-07-29 ENCOUNTER — Encounter: Payer: Self-pay | Admitting: Pulmonary Disease

## 2014-07-29 ENCOUNTER — Ambulatory Visit (INDEPENDENT_AMBULATORY_CARE_PROVIDER_SITE_OTHER): Payer: Medicare Other | Admitting: Pulmonary Disease

## 2014-07-29 VITALS — BP 142/82 | HR 76 | Temp 97.8°F | Wt 131.4 lb

## 2014-07-29 DIAGNOSIS — E7800 Pure hypercholesterolemia, unspecified: Secondary | ICD-10-CM

## 2014-07-29 DIAGNOSIS — M810 Age-related osteoporosis without current pathological fracture: Secondary | ICD-10-CM

## 2014-07-29 DIAGNOSIS — J449 Chronic obstructive pulmonary disease, unspecified: Secondary | ICD-10-CM

## 2014-07-29 DIAGNOSIS — K5901 Slow transit constipation: Secondary | ICD-10-CM | POA: Diagnosis not present

## 2014-07-29 DIAGNOSIS — K219 Gastro-esophageal reflux disease without esophagitis: Secondary | ICD-10-CM | POA: Diagnosis not present

## 2014-07-29 DIAGNOSIS — M791 Myalgia: Secondary | ICD-10-CM

## 2014-07-29 DIAGNOSIS — F411 Generalized anxiety disorder: Secondary | ICD-10-CM

## 2014-07-29 DIAGNOSIS — IMO0001 Reserved for inherently not codable concepts without codable children: Secondary | ICD-10-CM

## 2014-07-29 DIAGNOSIS — M150 Primary generalized (osteo)arthritis: Secondary | ICD-10-CM

## 2014-07-29 DIAGNOSIS — M159 Polyosteoarthritis, unspecified: Secondary | ICD-10-CM

## 2014-07-29 DIAGNOSIS — M609 Myositis, unspecified: Secondary | ICD-10-CM

## 2014-07-29 MED ORDER — IMIPRAMINE HCL 25 MG PO TABS: 50.0000 mg | ORAL_TABLET | Freq: Every day | ORAL | Status: DC

## 2014-07-29 NOTE — Patient Instructions (Signed)
Today we updated your med list in our EPIC system...    Continue your current medications the same...    We refilled the meds you requested...  Keep up the good work w/ your exercise program...    Consider enrolling in a "silver sneakers" program...  Call for any questions...  Let's plan a follow up visit in 1mo, sooner if needed for problems.Marland KitchenMarland Kitchen

## 2014-07-29 NOTE — Progress Notes (Signed)
Subjective:    Patient ID: Lisa King, female    DOB: 10/06/1931, 81 y.o.   MRN: [id number]  HPI 81 y/o WF here for a follow up visit... she has mult med problems as noted...  Followed for general medical purposes w/ hx of COPD, Hyperchol, GERD/ Diverics/ Polyps, DJD/ FM/ Osteoporosis, and anxiety... she likes to take various supplements, and she is also followed by Rodena Medin for GI, and DrKendall for Ortho... ~  SEE PREV EPIC NOTES FOR OLDER DATA>>  ~  August 25, 2011:  Yearly ROV & Kareem says she fell 2 wks ago (trying to kill a spider) w/ some chest wall discomfort & requests a muscle relaxer; she still smokes a cig "now & then" and has underlying COPD but declines regular inhalers,etc; fortunately she has avoided major upper resp infections & denies cough, sputum, ch in SOB/DOE etc; she gets a little exercise by walking...     We reviewed prob list, meds, xrays and labs> see below for updates>>  CXR 7/13 showed heart at the upper lim of norm in size, lungs are clear/ overinflated c/w COPD, apical pleural thickening, NAD...  LABS 7/13:  FLP- not at goals on diet alone w/ TChol 219 LDL=115;  Chems- wnl;  CBC- wnl;  TSH=1.38;  VitD=52    ~  August 31, 2012:  10yr ROV & Lisa King describes a good yr- just c/o some insomnia & arthritis pain... We reviewed the following medical problems during today's office visit >>     Hearing Loss> she had f/u appt w/ drBates ENT 3/14 & cerumen impactions removed...    COPD, Cig smoker> states her last cig was 02/15/12; still refuses breathing meds; states her breathing is good, notes better energy, & denies cough, sput, hemoptysis, SOB, etc; she is too sedentary & needs to incr exercise...    CHOL> on Fish Oil; FLP is improved on diet alone- TChol 191, TG 107, HDL 61, LDL 109     GI- GERD, Divertics, Polyps> on OTC H2Blocker like Pepcid/Zantac, Miralax, Colace; she is due for a follow up colonoscopy this yr & we will refer...    DJD, FM> on Anaprox220Bid prn,  Flexeril10Tid prn, BorageOil & "tart cherry advanced"; states that air conditioning makes her neck arthritis worse; advised heating pad & anti-inflamm meds...    Osteoporosis> on Calcium, MVI; BMD was -3.5 in spine in 2007 & she has repeatedly refused f/u BMD, Bisphos med rx, or alternative med rx or endocrine consultation; only agrees to take Calcium & Vits despite the potential severe consequences...    Anxiety, Insomnia> on Tranxene15-1/2Tid [facility] changed her to the 15mg  tabs), Tofranil25-2Qhs...  We reviewed prob list, meds, xrays and labs> see below for updates >>   LABS 7/14:  FLP- improved on diet alone;  Chems- wnl;  CBC- ok w/ Hg=11.7 MCV=93;  TSH=1.06;  VitD=61  ~  September 03, 2013:  Yearly ROV & post hospital f/u visit> Lisa King was [facility] by [facility] 7/9 - 08/25/13 w/ a lower GIB believed to be a self-limited divertic bleed that resolved on it's own;  Hx GERD, divertics, polyps, constip, and hems;  Followed by DrGessner on Famotidine20, & Miralax, last colon 10/09 showed divertics and several polyps (adenomatous & hyperplastic), f/u suggested in 28yrs & now overdue;  She presented w/ rectal bleeding- dark red blood- but no abd pain, no n/v, not lightheaded or syncopal, hemodynamically stable w/o hypotension & Hg in the 9-10 range;  She was seen by DrJacobs who rec conservative  approach & outpt f/u to arrange for her needed colonoscopy...    Hearing Loss> she has hearing aides from [facility] & prev cerumen impactions removed...    COPD, ex Cig smoker> states her last cig was 02/15/12; still refuses breathing meds; states her breathing is good, notes better energy, & denies cough, sput, hemoptysis, SOB, etc; she is too sedentary & needs to incr exercise...    CHOL> on Fish Oil; FLP 7/14 was improved on diet alone- TChol 191, TG 107, HDL 61, LDL 109... She is not fasting for f/u labs today.    GI- GERD, Divertics, constip, Polyps, hems> on OTC Pepcid20 (refused to take the Omep40) & Miralax; she did not  set up her colonoscopy due in 2014; Adm 7/15 w/ divertic bleed, self-lim, hemodynam stable, Hg nadir= 8.8; placed on Fe, Prilosec, Miralax & asked to stop ASA, NSAIDs; she refuses Fe due to constip, refuses Prilosec ("I take Pepcid"), still taking her ASA; asked to try Ferrosequels, Miralax daily, Senakot-S Qhs, and f/u w/ GI- appt sched.    GU> she had a UTI w/ [facility] 7/15 Hosp; treated w/ Levaquin...     DJD, FM> prev on Anaprox220Bid prn, Flexeril10Tid prn, BorageOil & "tart cherry advanced"; states that air conditioning makes her neck arthritis worse; advised heating pad & anti-inflamm meds prn...    Osteoporosis> on Calcium, MVI; BMD was -3.5 in spine in 2007 & she has repeatedly refused f/u BMD, Bisphos med rx, or alternative med rx or endocrine consultation; only agrees to take Calcium & Vits despite the potential severe consequences...    Anxiety, Insomnia> on Tranxene7.5-1/2Tid prn, Tofranil25-2Qhs...     Anemia> Lower GIB 7/15 believed due to divertics; Hg nadir= 8.8, she is INTOL to Fe w/ constip, rec to take [facility] + Miralax/ Senakot-S... We reviewed prob list, meds, xrays and labs> see below for updates >>   EKG 7/15 showed NSR, rate75, inferior scar & abn r progression...   LABS in hosp 7/15:  Chems- wnl;  CBC- Hg in the 9/10 range;  UA w/ +WBC, RBC, bact & Cult +EColi sens Levaquin...  LABS in office 7/15:  CBC- Hg=9.8, Fe=61 (17%);  TSH=0.95;  Vit B12= 588;  Vit D= 80...   ~  December 04, 2013:  68mo ROV & Lisa King turned 81 y/o- stable overall w/ mult somatic complaints; c/o giat abn & balance issues w/ dizziness and intermittent BPPV symptoms- she has Meclizine for prn use 7 we discussed the ELPEY maneuver to help as well, reminded to continue ASA81/d... We reviewed the following medical problems during today's office visit >>     Hearing Loss> she has hearing aides from [facility] & prev cerumen impactions removed; I inspected EACs & they are clear today...    COPD, ex Cig  smoker> states her last cig was 02/15/12; still refuses breathing meds; states her breathing is good, notes energy fair, & denies cough, sput, hemoptysis, SOB, etc; she is too sedentary & needs to incr exercise...    CHOL> on Fish Oil; FLP 7/14 was improved on diet alone- TChol 191, TG 107, HDL 61, LDL 109... She is not fasting for f/u labs today...    GI- GERD, Divertics, Constip, Polyps, hems> on OTC Pepcid20 (prev refused to take the Omep40) & Miralax/ Senakot-S/ Suppos; Adm 7/15 w/ divertic bleed, self-lim, hemodynam stable, Hg nadir= 8.8; placed on Fe, Prilosec, Miralax & asked to stop ASA, NSAIDs; she refuses Fe due to constip, refuses Prilosec ("I take Pepcid"), still  taking her ASA; asked to try Ferrosequels, Miralax daily, Senakot-S Qhs, Suppos prn; she had f/u Colonoscopy 9/15 DrGessner- mod divertics, melanosis, redundant colon, no polyps...     GU> she had a UTI w/ [facility] 7/15 Hosp; treated w/ Levaquin & resolved...     DJD, FM> on Aleve prn, Flexeril10Tid prn, BorageOil & "tart cherry advanced"; states that air conditioning makes her neck arthritis worse; advised heating pad & anti-inflamm meds prn only...    Osteoporosis> on Calcium, MVI; BMD was -3.5 in spine in 2007 & she has repeatedly refused f/u BMD, Bisphos med rx, or alternative med rx or endocrine consultation; only agrees to take Calcium & Vits despite the potential severe consequences!!!    Anxiety, Insomnia> on Tranxene7.5-1/2Tid prn, Tofranil25-2Qhs...     Anemia> Lower GIB 7/15 believed due to divertics; Hg nadir= 8.8, she is INTOL to Fe w/ constip, rec to take [facility] + Miralax/ Senakot-S/ etc... We reviewed prob list, meds, xrays and labs> see below for updates >>   LABS 10/15:  CBC- Hg=12.5, MCV=90, Fe=97 (25%sat), Ferritin=18.6.Marland KitchenMarland Kitchen  ~  July 29, 2014:  8 month ROV & recheck> Lisa King returns feeling well w/o new complaints or concerns except an eyelash mite being treated by [employer];  She has NOT been smoking and denies  cough, sputum, SOB, CP, etc;  She exercises by walking (but not regularly) & yard work- we discussed joining a Retail banker...  She has not yet chosen a new PCP and she is not fasting today...     COPD, ex-smoker> not on breathing meds and she likes it this way; denies cough, sputum, SOB...    Chol> on diet + Fish oil; last FLP was 7/14 but she insists it was done last yr (nothing in Epic); reminded to come fasting for her appt for labs...    GI> GERD, Divertics, constip, polyps, hems> she is stable & asymptomatic on Prilosec/ Pepcid, Colace/ Miralax w/o heartburn, dysphagia, etc and normal daily BMs at present, no blood seen...    GU> stable w/o dysuria, feels that she emppties adeq, etc...     Osteop> on calcium, MVI, VitD, etc; she has 2 compression fxs, she continues to decline f/u BMD or discussion about Bisphos or alternative rx...     Anxiety/ insomnia> on Tranxene7.5Bid and Imipramine25-2Qhs; she wants to add a new OTC sleep aide called Somnapure & I rec she discuss w/ her pharm... PLAN>>  She requests refills for Imipramine rx, otherw doing satis & she is rec to increase her exercise program...         Problem List:   HEARING LOSS (ICD-389.9) - hx bilat stapes surg for hearing problems by DrKraus yrs ago... states she can't have MRI's due to the stapes implants... now sees DrBates w/ hearing aides bilat... ~  3/14:  She had f/u DrBates> URI & cerumen impactions- removed + conservative therapy...  ~  7/15:  She has hearing aides from ENT...  COPD (ICD-496) - states her breathing is OK and she denies SOB, and declines use of inhalers, ex= walking... She denies cough, sputum, hemoptysis, CP, palpit SOB, edema... she has declined to do PFT's in the past... ~  CXR 7/11 showed COPD, atherosclerotic calcif in Ao, osteopenic bones, compression of 2 mid thor vertebral bodies, NAD.Marland Kitchen. ~  CXR 7/12 showed hyperinflation, some apical pleural scarring, sm HH, mitral annular calcif, NAD.Marland Kitchen. ~   CXR 7/13 showed heart at the upper lim of norm in size, lungs are clear/ overinflated c/w  COPD, apical pleural thickening, NAD...  CIGARETTE SMOKER (ICD-305.1) - states she's quit smoking now "but I cheat now & again"... she refuses Chantix stating that a friend tried it and died!!! We discussed other smoking cessation strategies... ~  7/13:  She states "I don't buy them anymore" but smokes now & then when she can get them... ~  States she quit 02/15/12 & denies on-going resp symptoms of cough, sput, hemoptysis, CP, dyspnea, edema, etc...  HYPERCHOLESTEROLEMIA (ICD-272.0) - she has refused statin Rx in the past... tried Simvastatin 20mg /d 5/09 but states that she developed "muscle cramps, throat closing & insomnia" which resolved off this med- refuses to try other meds "I'm on Fish Oil & diet"... ~  FLP 11/07 showed Tchol 214, TG 130, HDL 51, LDL 125... refused statin, perfers diet alone... ~  FLP 5/09 showed TChol 228, TG 200, HDL 48, LDL 139... rec- start Simva20... pt DC'd it. ~  FLP 7/10 showed TChol 219, TG 171, HDL 54, LDL 127... refuses Fibrate or other lipid meds. ~  FLP 6/11 showed TChol 234, TG 166, HDL 55, LDL 133... discussed diet Rx. ~  FLP 6/12 showed TChol 196, TG 134, HDL 60, LDL 110... Improved on diet Rx! ~  FLP 7/13 showed TChol 219, TG 139, HDL 68, LDL 115... Offered low dose statin but she declines... ~  FLP 7/14 on diet alone showed TChol 191, TG 107, HDL 61, LDL 109... Improved on diet alone.  GERD (ICD-530.81)  -  on PEPCID 20mg Qd but she is adamant that FAMOTIDINE works much better than Pepcid... followed for GI by DrGessner...  DIVERTICULOSIS OF COLON (ICD-562.10) - she denies D, C, blood, ch in bowel habits... COLONIC POLYPS (ICD-211.3) - colonoscopy 7/06 showed several 4-74mm adenomatous polyps in a long redundant colon w/ divertics & hems... f/u colonoscopy 10/09 showed divertics, several polyps= adenomatous & hyperpl w/ f/u colon planned for 48yrs... HEMORRHOIDS  (ICD-455.6) ~  2014> she is due for a f/u w/ DrGessner re: f/u colonoscopy => she never sched this f/u visit... ~  7/15: Hosp by [facility] w/ Lower GIB believed to be a diverticv bleed, self limited, hemodynamically stable, Hg~9 range, & set up for outpt f/u by [employer]... ~  Colonoscopy 9/15 by DrGessner showed mod sigmoid divertics, no polyps, extensive melanosis, redundant colon...  ~  10/15: on Pepcid20, not taking Omep; on Miralax prn & advised regular dosing, plus Senakot-S Qhs, and suppos prn; her CC is arthritis and still takes NSAIDs regularly- advised prn dosing & regular Omeprazole40 30 min before the 1st meal each day...  DEGENERATIVE JOINT DISEASE (ICD-715.90) - hx of leg pain w/ norm ABI's 2007... eval by Janett Billow for Ortho w/ "arthritis and scoliosis" treated w/ muscle relaxer and improved... she states she can't get MRI due to prev stapes surg... ~  7/11: she notes that black cherry concentrate is good "it took a freind's pain away"... ~  Arthritis is her CC> on Aleve regularly + Tylenol prn; advised intermittwent prn dosing f NSAIDs and use the Omep40 daily...  FIBROMYALGIA (ICD-729.1) - on IMIPRAMINE 25mg - 2tabs daily, + Flexeril10Tid prn... ~  7/12:  She notes that her FM pain was exac by the dog bite 5/12...  OSTEOPOROSIS (ICD-733.00) - known severe osteoporosis w/ BMD 11/07 showing TScores -3.5 in spine & -2.6 in left fem neck... pt states intol to [facility] and refused other bisphosphonates or miacalcin... in addition she has refuses Endocrine referral to discuss further... "I take calcium and vitamins" but I carefully explained  how that is not sufficient and that she is likely to suffer a serious compression fracture of her sp, or a fall could easily fracture her hip- she refuses Rx or further help... I have advised her to talk to her GYN about this as well. ~  7/10:  We discussed osteoporosis options again including bisphos, reclast, miacalcin, forteo, & the new prolia- she is not  interested in Rx or in Endocrine second opinion... ~  7/11:  Ditto... ~  7/12:  I reviewed all the above & offered the new ATELVIA 35mg /wk (fewer GI problems) and she agreed to try it, Rx written==> stopped on her own. ~  7/13:  We reviewed the situation w/ her bones, osteoporosis & her risks; she again refuses meds for this problem, and declines offers for subspec referral (Endocrine, etc)... ~  7/14:  She once again confirms her staunch refusal to consider f/u BMD, med rx of any kind for her osteoporosis, or 2nd opinion consultation... ~  Labs 7/15 showed Vit D = 80...  ANXIETY (ICD-300.00)- she takes Tranxene7.5 at bedtime to help her sleep...  ANEMIA>  She was [facility] 7/15 w/ Lower GIB believed to be due to divertics, managed conservatively & referred to GI as outpt for f/u... ~  Baseline labs showed Hg ~12 range... ~  Labs 7/15 in hosp showed Hg= 9-10 range w/ nadir=8.8; placed on Fe at disch but poorly tolerated.... ~  Labs 7/15 in office f/u showed Hg= 9.8, Fe= 61 (17%sat), B12= 588... rec to try Ferrosequels... ~  Labs 10/15 showed Hg= 12.5, Fe= 97 (25%sat), Ferritin= 19... 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 she takes ASA 81mg /d... had Tetanus shot 1997 & Pneumovax 2000... she refuses Flu shots! ~  Supplement meds include> Calc w/ D, MVI, Tums, FishOil, BorageOil, FlaxSeedOil, etc... ~  10/15: pt asked to f/u in 71mo w/ Fasting labs, but she refused- wants routine ROV in 67yr...   Past Surgical History  Procedure Laterality Date  . Tonsillectomy and adenoidectomy      as a child  . Stapes surgery      Dr Dorma Russell  . Cataract extraction, bilateral  2009  . Colonoscopy  2009, 2006     Outpatient Encounter Prescriptions as of 07/29/2014  Medication Sig  . aspirin 81 MG tablet Take 81 mg by mouth daily.  . Calcium Carbonate-Vitamin D (CALTRATE 600+D) 600-400 MG-UNIT per tablet Take 1 tablet by mouth daily.    . cholecalciferol (VITAMIN D)  1000 UNITS tablet Take 1,000 Units by mouth daily.  . clorazepate (TRANXENE) 7.5 MG tablet TAKE ONE TABLET BY MOUTH TWICE DAILY AS NEEDED FOR ANXIETY  . cyclobenzaprine (FLEXERIL) 10 MG tablet Take 10 mg by mouth 3 (three) times daily as needed for muscle spasms.  Marland Kitchen docusate sodium (COLACE) 100 MG capsule Take 100 mg by mouth daily.   . famotidine (PEPCID) 20 MG tablet TAKE ONE TABLET BY MOUTH ONCE DAILY  . fish oil-omega-3 fatty acids 1000 MG capsule Take 2 g by mouth daily.    Marland Kitchen imipramine (TOFRANIL) 25 MG tablet Take 1 tablet (25 mg total) by mouth daily. (Patient taking differently: Take 50 mg by mouth daily. )  . meclizine (ANTIVERT) 25 MG tablet Take 1 tablet (25 mg total) by mouth 4 (four) times daily as needed for dizziness.  . Misc Natural Products (TART CHERRY ADVANCED) CAPS Take 1 capsule by mouth daily.  . naproxen sodium (ANAPROX) 220 MG tablet  Take 220 mg by mouth at bedtime.  . polyethylene glycol (MIRALAX / GLYCOLAX) packet Take 17 g by mouth daily.    . sennosides-docusate sodium (SENOKOT-S) 8.6-50 MG tablet Take 1 tablet by mouth daily.  . [DISCONTINUED] ciprofloxacin (CIPRO) 250 MG tablet Take 1 tablet (250 mg total) by mouth 2 (two) times daily.  . [DISCONTINUED] levofloxacin (LEVAQUIN) 250 MG tablet   . [DISCONTINUED] omeprazole (PRILOSEC) 40 MG capsule Take 30 minutes before the first meal of the day  . [DISCONTINUED] omeprazole (PRILOSEC) 40 MG capsule Take 1 capsule (40 mg total) by mouth daily.   No facility-administered encounter medications on file as of 07/29/2014.    Allergies  Allergen Reactions  . Penicillins Anaphylaxis  . Bisphosphonates Other (See Comments)    [name]  . Simvastatin     Unknown   . Sulfonamide Derivatives Rash and Other (See Comments)    blisters    Current Medications, Allergies, Past Medical History, Past Surgical History, Family History, and Social History were reviewed in [employer]  record.   Review of Systems       See HPI - all other systems neg except as noted...       The patient complains of dyspnea on exertion, severe indigestion/heartburn, muscle weakness, and difficulty walking.  The patient denies anorexia, fever, weight loss, weight gain, vision loss, decreased hearing, hoarseness, chest pain, syncope, peripheral edema, prolonged cough, headaches, hemoptysis, abdominal pain, melena, hematochezia, hematuria, incontinence, suspicious skin lesions, transient blindness, depression, unusual weight change, abnormal bleeding, enlarged lymph nodes, and angioedema.     Objective:   Physical Exam    WD, WN, 81 y/o WF in NAD... GENERAL:  Alert & oriented; pleasant & cooperative... HEENT:  /AT, EOM-wnl, PERRLA, EACs-clear, TMs-wnl, NOSE-clear, THROAT-clear & wnl. NECK:  Supple w/ fairROM; no JVD; normal carotid impulses w/o bruits; no thyromegaly or nodules palpated; no lymphadenopathy. CHEST:  decr BS bilat, clear to P & A; without wheezes/ rales/ or rhonchi heard... HEART:  Regular Rhythm; without murmurs/ rubs/ or gallops detected... ABDOMEN:  Soft & nontender; normal bowel sounds; no organomegaly or masses palpated... EXT: without deformities, mod arthritic changes; no varicose veins/ +venous insuffic/ tr edema. NEURO:  CN's intact; no focal neuro deficits... DERM:  No lesions noted; prev nasal skin surg; no rash etc...  RADIOLOGY DATA:  Reviewed in the EPIC EMR & discussed w/ the patient...  LABORATORY DATA:  Reviewed in the EPIC EMR & discussed w/ the patient...   Assessment & Plan:    COPD>  she's quit smoking & denies SOB etc but she is clearly too sedentary & asked to incr activity/ exercise...  CHOL>  On diet alone & last FLP was improved & close to goals...   GI>  GERD/ Divertics/ Polyps>  Followed by Rodena Medin for GI; stable on Famotidine which she swears works better that [employer]; due for 72yr f/u colon in 2014... GIB, likely divertic bleed 7/15>  self limited, feeling better, c/o chr constip & rec to take Miralax & Senakot-S (but she wants Linzess- like her brother); referred to GI for f/u eval & to sched colon=> done 9/15 G[facility]-  divertics, melanosis, redundant colon...  DJD/ FM>  Stable overall; uses OTC anti-inflamm as needed + Imipramine Qhs...  Osteoporosis/ Compression fx>  She has 2 compression fx that are old, she is very high risk, we had another long conversation about her osteoporosis & the risk it imposes, we reviewed the avail therapies (again) but she declines  all avail therapies & refuses second opinion consult etc...   Anxiety>  She uses the Chlorazepate as needed...  Anemia> due to GIB; f/u Hg=9.8, Fe=61, and rec to take FERROSEQUELS=>  Follow up labs 10/15 w/ Hg=12.5, Fe=97...   Patient's Medications  New Prescriptions   No medications on file  Previous Medications   ASPIRIN 81 MG TABLET    Take 81 mg by mouth daily.   CALCIUM CARBONATE-VITAMIN D (CALTRATE 600+D) 600-400 MG-UNIT PER TABLET    Take 1 tablet by mouth daily.     CHOLECALCIFEROL (VITAMIN D) 1000 UNITS TABLET    Take 1,000 Units by mouth daily.   CLORAZEPATE (TRANXENE) 7.5 MG TABLET    TAKE ONE TABLET BY MOUTH TWICE DAILY AS NEEDED FOR ANXIETY   CYCLOBENZAPRINE (FLEXERIL) 10 MG TABLET    Take 10 mg by mouth 3 (three) times daily as needed for muscle spasms.   DOCUSATE SODIUM (COLACE) 100 MG CAPSULE    Take 100 mg by mouth daily.    FAMOTIDINE (PEPCID) 20 MG TABLET    TAKE ONE TABLET BY MOUTH ONCE DAILY   FISH OIL-OMEGA-3 FATTY ACIDS 1000 MG CAPSULE    Take 2 g by mouth daily.     IMIPRAMINE (TOFRANIL) 25 MG TABLET    Take 1 tablet (25 mg total) by mouth daily.   MECLIZINE (ANTIVERT) 25 MG TABLET    Take 1 tablet (25 mg total) by mouth 4 (four) times daily as needed for dizziness.   MISC NATURAL PRODUCTS (TART CHERRY ADVANCED) CAPS    Take 1 capsule by mouth daily.   NAPROXEN SODIUM (ANAPROX) 220 MG TABLET    Take 220 mg by mouth at bedtime.    POLYETHYLENE GLYCOL (MIRALAX / GLYCOLAX) PACKET    Take 17 g by mouth daily.     SENNOSIDES-DOCUSATE SODIUM (SENOKOT-S) 8.6-50 MG TABLET    Take 1 tablet by mouth daily.  Modified Medications   No medications on file  Discontinued Medications   CIPROFLOXACIN (CIPRO) 250 MG TABLET    Take 1 tablet (250 mg total) by mouth 2 (two) times daily.   LEVOFLOXACIN (LEVAQUIN) 250 MG TABLET       OMEPRAZOLE (PRILOSEC) 40 MG CAPSULE    Take 30 minutes before the first meal of the day   OMEPRAZOLE (PRILOSEC) 40 MG CAPSULE    Take 1 capsule (40 mg total) by mouth daily.

## 2014-10-06 ENCOUNTER — Other Ambulatory Visit: Payer: Self-pay | Admitting: Pulmonary Disease

## 2014-10-07 DIAGNOSIS — H01114 Allergic dermatitis of left upper eyelid: Secondary | ICD-10-CM | POA: Diagnosis not present

## 2014-10-07 DIAGNOSIS — H01111 Allergic dermatitis of right upper eyelid: Secondary | ICD-10-CM | POA: Diagnosis not present

## 2014-10-14 DIAGNOSIS — H10413 Chronic giant papillary conjunctivitis, bilateral: Secondary | ICD-10-CM | POA: Diagnosis not present

## 2014-10-14 DIAGNOSIS — Z961 Presence of intraocular lens: Secondary | ICD-10-CM | POA: Diagnosis not present

## 2014-10-24 ENCOUNTER — Telehealth: Payer: Self-pay | Admitting: Pulmonary Disease

## 2014-10-24 ENCOUNTER — Other Ambulatory Visit (INDEPENDENT_AMBULATORY_CARE_PROVIDER_SITE_OTHER): Payer: Medicare Other

## 2014-10-24 DIAGNOSIS — R300 Dysuria: Secondary | ICD-10-CM | POA: Diagnosis not present

## 2014-10-24 DIAGNOSIS — R309 Painful micturition, unspecified: Secondary | ICD-10-CM

## 2014-10-24 LAB — URINALYSIS, ROUTINE W REFLEX MICROSCOPIC
Bilirubin Urine: NEGATIVE
Hgb urine dipstick: NEGATIVE
Ketones, ur: NEGATIVE
Nitrite: NEGATIVE
Specific Gravity, Urine: 1.01 (ref 1.000–1.030)
Total Protein, Urine: NEGATIVE
Urine Glucose: NEGATIVE
Urobilinogen, UA: 0.2 (ref 0.0–1.0)
pH: 6 (ref 5.0–8.0)

## 2014-10-24 MED ORDER — CIPROFLOXACIN HCL 500 MG PO TABS: 500.0000 mg | ORAL_TABLET | Freq: Two times a day (BID) | ORAL | Status: DC

## 2014-10-24 NOTE — Telephone Encounter (Signed)
Patient states she has pain with urination.  Entire body hurts, starts in arms and hands and goes all over body.  Patient says she has had blood in her urine, a lot last night but not as much this morning.  No fever. Patient says that she thinks she has an infection in her eyebrows.  The eye doctor has been treating it, but it is still there.  She thinks it is moving into her head.  She says it is not red, but there was a sore over the eye and the medication given by the eye doctor cleared it up, but the sore spot has come back.  She says that it had "mites in it" but they are not there anymore.      Pharmacy: [facility].   Allergies  Allergen Reactions  . Penicillins Anaphylaxis  . Bisphosphonates Other (See Comments)    [name]  . Simvastatin     Unknown   . Sulfonamide Derivatives Rash and Other (See Comments)    blisters

## 2014-10-24 NOTE — Telephone Encounter (Signed)
Per SN, Pt needs UA + C&S now Call in Cipro 500mg  #14 one PO BID x 7 days

## 2014-10-24 NOTE — Telephone Encounter (Signed)
Rx sent to pharmacy. Order entered for UA and culture Patient notified and will come by today for labs Nothing further needed.

## 2014-10-27 LAB — URINE CULTURE: Colony Count: 50000

## 2014-11-05 ENCOUNTER — Other Ambulatory Visit: Payer: Self-pay | Admitting: Pulmonary Disease

## 2014-11-05 DIAGNOSIS — L309 Dermatitis, unspecified: Secondary | ICD-10-CM | POA: Diagnosis not present

## 2014-11-16 ENCOUNTER — Other Ambulatory Visit: Payer: Self-pay | Admitting: Pulmonary Disease

## 2014-11-25 ENCOUNTER — Telehealth: Payer: Self-pay | Admitting: Pulmonary Disease

## 2014-11-25 MED ORDER — CLORAZEPATE DIPOTASSIUM 7.5 MG PO TABS: 7.5000 mg | ORAL_TABLET | Freq: Two times a day (BID) | ORAL | Status: DC | PRN

## 2014-11-25 NOTE — Telephone Encounter (Signed)
According to our records, this was called in on 11/18/14. When speaking to the pt the pharmacy was given her a hard time about having this filled. I spoke with the pharmacist they did not have the prescription from 11/18/14 on file. A verbal order has been given to the pharmacy for this medication. Pt is aware of this. Nothing further was needed.

## 2014-12-05 ENCOUNTER — Ambulatory Visit: Payer: Medicare Other | Admitting: Pulmonary Disease

## 2014-12-17 ENCOUNTER — Other Ambulatory Visit: Payer: Self-pay | Admitting: Pulmonary Disease

## 2015-01-20 ENCOUNTER — Telehealth: Payer: Self-pay | Admitting: Pulmonary Disease

## 2015-01-20 DIAGNOSIS — M159 Polyosteoarthritis, unspecified: Secondary | ICD-10-CM

## 2015-01-20 DIAGNOSIS — M810 Age-related osteoporosis without current pathological fracture: Secondary | ICD-10-CM

## 2015-01-20 DIAGNOSIS — E7800 Pure hypercholesterolemia, unspecified: Secondary | ICD-10-CM

## 2015-01-20 DIAGNOSIS — M150 Primary generalized (osteo)arthritis: Secondary | ICD-10-CM

## 2015-01-20 DIAGNOSIS — D62 Acute posthemorrhagic anemia: Secondary | ICD-10-CM

## 2015-01-20 DIAGNOSIS — J449 Chronic obstructive pulmonary disease, unspecified: Secondary | ICD-10-CM

## 2015-01-20 DIAGNOSIS — K219 Gastro-esophageal reflux disease without esophagitis: Secondary | ICD-10-CM

## 2015-01-20 NOTE — Telephone Encounter (Signed)
Pt is requesting lab work to be done prior to her 12.13 OV. Please advise Dr. Lenna Gilford thanks

## 2015-01-21 NOTE — Telephone Encounter (Signed)
Dr. Lenna Gilford, please advise if labs can be done prior to 12/13 appt?

## 2015-01-22 NOTE — Telephone Encounter (Signed)
Per SN>> Order FLP, BMET, Hepatic Panel, CBC with diff, TSH, Vit D, and Iron panel   Orders placed into Epic and pt notified of pending labs  Nothing further is needed

## 2015-01-22 NOTE — Telephone Encounter (Signed)
Awaiting response from Dr. Lenna Gilford

## 2015-01-23 ENCOUNTER — Other Ambulatory Visit (INDEPENDENT_AMBULATORY_CARE_PROVIDER_SITE_OTHER): Payer: Medicare Other

## 2015-01-23 DIAGNOSIS — D62 Acute posthemorrhagic anemia: Secondary | ICD-10-CM

## 2015-01-23 DIAGNOSIS — K219 Gastro-esophageal reflux disease without esophagitis: Secondary | ICD-10-CM

## 2015-01-23 DIAGNOSIS — D500 Iron deficiency anemia secondary to blood loss (chronic): Secondary | ICD-10-CM | POA: Diagnosis not present

## 2015-01-23 DIAGNOSIS — J449 Chronic obstructive pulmonary disease, unspecified: Secondary | ICD-10-CM

## 2015-01-23 DIAGNOSIS — M159 Polyosteoarthritis, unspecified: Secondary | ICD-10-CM

## 2015-01-23 DIAGNOSIS — M810 Age-related osteoporosis without current pathological fracture: Secondary | ICD-10-CM | POA: Diagnosis not present

## 2015-01-23 DIAGNOSIS — E7800 Pure hypercholesterolemia, unspecified: Secondary | ICD-10-CM | POA: Diagnosis not present

## 2015-01-23 DIAGNOSIS — M150 Primary generalized (osteo)arthritis: Secondary | ICD-10-CM

## 2015-01-23 LAB — LIPID PANEL
CHOL/HDL RATIO: 2
CHOLESTEROL: 186 mg/dL (ref 0–200)
HDL: 100.2 mg/dL (ref >39.00)
LDL CALC: 66 mg/dL (ref 0–99)
NonHDL: 85.92
TRIGLYCERIDES: 101 mg/dL (ref 0.0–149.0)
VLDL: 20.2 mg/dL (ref 0.0–40.0)

## 2015-01-23 LAB — CBC WITH DIFFERENTIAL/PLATELET
Basophils Absolute: 0.1 10*3/uL (ref 0.0–0.1)
Basophils Relative: 1.2 % (ref 0.0–3.0)
EOS ABS: 0.1 10*3/uL (ref 0.0–0.7)
EOS PCT: 1.7 % (ref 0.0–5.0)
HEMATOCRIT: 37.8 % (ref 36.0–46.0)
HEMOGLOBIN: 12.6 g/dL (ref 12.0–15.0)
LYMPHS PCT: 24.3 % (ref 12.0–46.0)
Lymphs Abs: 1.4 10*3/uL (ref 0.7–4.0)
MCHC: 33.3 g/dL (ref 30.0–36.0)
MCV: 91.5 fl (ref 78.0–100.0)
MONO ABS: 0.4 10*3/uL (ref 0.1–1.0)
Monocytes Relative: 7.1 % (ref 3.0–12.0)
Neutro Abs: 3.6 10*3/uL (ref 1.4–7.7)
Neutrophils Relative %: 65.7 % (ref 43.0–77.0)
Platelets: 258 10*3/uL (ref 150.0–400.0)
RBC: 4.14 Mil/uL (ref 3.87–5.11)
RDW: 14.2 % (ref 11.5–15.5)
WBC: 5.6 10*3/uL (ref 4.0–10.5)

## 2015-01-23 LAB — BASIC METABOLIC PANEL
BUN: 11 mg/dL (ref 6–23)
CO2: 28 mEq/L (ref 19–32)
CREATININE: 0.83 mg/dL (ref 0.40–1.20)
Calcium: 9.4 mg/dL (ref 8.4–10.5)
Chloride: 105 mEq/L (ref 96–112)
GFR: 70.07 mL/min (ref >60.00)
GLUCOSE: 98 mg/dL (ref 70–99)
POTASSIUM: 4.5 meq/L (ref 3.5–5.1)
Sodium: 141 mEq/L (ref 135–145)

## 2015-01-23 LAB — HEPATIC FUNCTION PANEL
ALBUMIN: 4.1 g/dL (ref 3.5–5.2)
ALT: 14 U/L (ref 0–35)
AST: 20 U/L (ref 0–37)
Alkaline Phosphatase: 73 U/L (ref 39–117)
Bilirubin, Direct: 0 mg/dL (ref 0.0–0.3)
Total Bilirubin: 0.4 mg/dL (ref 0.2–1.2)
Total Protein: 6.6 g/dL (ref 6.0–8.3)

## 2015-01-23 LAB — IBC PANEL
Iron: 86 ug/dL (ref 42–145)
SATURATION RATIOS: 22.9 % (ref 20.0–50.0)
TRANSFERRIN: 268 mg/dL (ref 212.0–360.0)

## 2015-01-23 LAB — TSH: TSH: 1.89 u[IU]/mL (ref 0.35–4.50)

## 2015-01-23 LAB — VITAMIN D 25 HYDROXY (VIT D DEFICIENCY, FRACTURES): VITD: 81.63 ng/mL (ref 30.00–100.00)

## 2015-01-28 ENCOUNTER — Ambulatory Visit: Payer: Medicare Other | Admitting: Pulmonary Disease

## 2015-02-18 ENCOUNTER — Other Ambulatory Visit: Payer: Self-pay | Admitting: Pulmonary Disease

## 2015-02-18 DIAGNOSIS — L719 Rosacea, unspecified: Secondary | ICD-10-CM | POA: Diagnosis not present

## 2015-03-04 ENCOUNTER — Encounter: Payer: Self-pay | Admitting: Pulmonary Disease

## 2015-03-04 ENCOUNTER — Ambulatory Visit (INDEPENDENT_AMBULATORY_CARE_PROVIDER_SITE_OTHER): Payer: Medicare Other | Admitting: Pulmonary Disease

## 2015-03-04 VITALS — BP 118/86 | HR 75 | Temp 97.4°F | Ht 62.0 in | Wt 135.4 lb

## 2015-03-04 DIAGNOSIS — E7800 Pure hypercholesterolemia, unspecified: Secondary | ICD-10-CM | POA: Diagnosis not present

## 2015-03-04 DIAGNOSIS — K219 Gastro-esophageal reflux disease without esophagitis: Secondary | ICD-10-CM

## 2015-03-04 DIAGNOSIS — M609 Myositis, unspecified: Secondary | ICD-10-CM

## 2015-03-04 DIAGNOSIS — M150 Primary generalized (osteo)arthritis: Secondary | ICD-10-CM

## 2015-03-04 DIAGNOSIS — J4489 Other specified chronic obstructive pulmonary disease: Secondary | ICD-10-CM

## 2015-03-04 DIAGNOSIS — K5901 Slow transit constipation: Secondary | ICD-10-CM

## 2015-03-04 DIAGNOSIS — IMO0001 Reserved for inherently not codable concepts without codable children: Secondary | ICD-10-CM

## 2015-03-04 DIAGNOSIS — M791 Myalgia: Secondary | ICD-10-CM

## 2015-03-04 DIAGNOSIS — F411 Generalized anxiety disorder: Secondary | ICD-10-CM

## 2015-03-04 DIAGNOSIS — J449 Chronic obstructive pulmonary disease, unspecified: Secondary | ICD-10-CM

## 2015-03-04 DIAGNOSIS — M810 Age-related osteoporosis without current pathological fracture: Secondary | ICD-10-CM

## 2015-03-04 DIAGNOSIS — M159 Polyosteoarthritis, unspecified: Secondary | ICD-10-CM

## 2015-03-04 MED ORDER — TRAZODONE HCL 50 MG PO TABS: 50.0000 mg | ORAL_TABLET | Freq: Every evening | ORAL | Status: DC | PRN

## 2015-03-04 NOTE — Patient Instructions (Signed)
Today we updated your med list in our EPIC system...    Continue your current medications the same...  We wrote for a trial supply of TRAZODONE 50mg  - one tab at bedtime as needed for sleep...  Call for any questions...  Let's plan a follow up visit in 76mo, sooner if needed for problems.Marland KitchenMarland Kitchen

## 2015-03-04 NOTE — Progress Notes (Signed)
Subjective:    Patient ID: Lisa King, female    DOB: November 01, 1931, 82 y.o.   MRN: [id number]  HPI 82 y/o WF here for a follow up visit... she has mult med problems as noted...  Followed for general medical purposes w/ hx of COPD, Hyperchol, GERD/ Diverics/ Polyps, DJD/ FM/ Osteoporosis, and anxiety... she likes to take various supplements, and she is also followed by Rodena Medin for GI, and DrKendall for Ortho... ~  SEE PREV EPIC NOTES FOR OLDER DATA>>  ~  August 25, 2011:  Yearly ROV & Lisa King says she fell 2 wks ago (trying to kill a spider) w/ some chest wall discomfort & requests a muscle relaxer; she still smokes a cig "now & then" and has underlying COPD but declines regular inhalers,etc; fortunately she has avoided major upper resp infections & denies cough, sputum, ch in SOB/DOE etc; she gets a little exercise by walking...     We reviewed prob list, meds, xrays and labs> see below for updates>>  CXR 7/13 showed heart at the upper lim of norm in size, lungs are clear/ overinflated c/w COPD, apical pleural thickening, NAD...  LABS 7/13:  FLP- not at goals on diet alone w/ TChol 219 LDL=115;  Chems- wnl;  CBC- wnl;  TSH=1.38;  VitD=52    ~  August 31, 2012:  60yr ROV & Lisa King describes a good yr- just c/o some insomnia & arthritis pain... We reviewed the following medical problems during today's office visit >>     Hearing Loss> she had f/u appt w/ drBates ENT 3/14 & cerumen impactions removed...    COPD, Cig smoker> states her last cig was 02/15/12; still refuses breathing meds; states her breathing is good, notes better energy, & denies cough, sput, hemoptysis, SOB, etc; she is too sedentary & needs to incr exercise...    CHOL> on Fish Oil; FLP is improved on diet alone- TChol 191, TG 107, HDL 61, LDL 109     GI- GERD, Divertics, Polyps> on OTC H2Blocker like Pepcid/Zantac, Miralax, Colace; she is due for a follow up colonoscopy this yr & we will refer...    DJD, FM> on Anaprox220Bid prn,  Flexeril10Tid prn, BorageOil & "tart cherry advanced"; states that air conditioning makes her neck arthritis worse; advised heating pad & anti-inflamm meds...    Osteoporosis> on Calcium, MVI; BMD was -3.5 in spine in 2007 & she has repeatedly refused f/u BMD, Bisphos med rx, or alternative med rx or endocrine consultation; only agrees to take Calcium & Vits despite the potential severe consequences...    Anxiety, Insomnia> on Tranxene15-1/2Tid [facility] changed her to the 15mg  tabs), Tofranil25-2Qhs...  We reviewed prob list, meds, xrays and labs> see below for updates >>   LABS 7/14:  FLP- improved on diet alone;  Chems- wnl;  CBC- ok w/ Hg=11.7 MCV=93;  TSH=1.06;  VitD=61  ~  September 03, 2013:  Yearly ROV & post hospital f/u visit> Lisa King was [facility] by [facility] 7/9 - 08/25/13 w/ a lower GIB believed to be a self-limited divertic bleed that resolved on it's own;  Hx GERD, divertics, polyps, constip, and hems;  Followed by DrGessner on Famotidine20, & Miralax, last colon 10/09 showed divertics and several polyps (adenomatous & hyperplastic), f/u suggested in 33yrs & now overdue;  She presented w/ rectal bleeding- dark red blood- but no abd pain, no n/v, not lightheaded or syncopal, hemodynamically stable w/o hypotension & Hg in the 9-10 range;  She was seen by DrJacobs who rec conservative  approach & outpt f/u to arrange for her needed colonoscopy...    Hearing Loss> she has hearing aides from [facility] & prev cerumen impactions removed...    COPD, ex Cig smoker> states her last cig was 02/15/12; still refuses breathing meds; states her breathing is good, notes better energy, & denies cough, sput, hemoptysis, SOB, etc; she is too sedentary & needs to incr exercise...    CHOL> on Fish Oil; FLP 7/14 was improved on diet alone- TChol 191, TG 107, HDL 61, LDL 109... She is not fasting for f/u labs today.    GI- GERD, Divertics, constip, Polyps, hems> on OTC Pepcid20 (refused to take the Omep40) & Miralax; she did not  set up her colonoscopy due in 2014; Adm 7/15 w/ divertic bleed, self-lim, hemodynam stable, Hg nadir= 8.8; placed on Fe, Prilosec, Miralax & asked to stop ASA, NSAIDs; she refuses Fe due to constip, refuses Prilosec ("I take Pepcid"), still taking her ASA; asked to try Ferrosequels, Miralax daily, Senakot-S Qhs, and f/u w/ GI- appt sched.    GU> she had a UTI w/ [facility] 7/15 Hosp; treated w/ Levaquin...     DJD, FM> prev on Anaprox220Bid prn, Flexeril10Tid prn, BorageOil & "tart cherry advanced"; states that air conditioning makes her neck arthritis worse; advised heating pad & anti-inflamm meds prn...    Osteoporosis> on Calcium, MVI; BMD was -3.5 in spine in 2007 & she has repeatedly refused f/u BMD, Bisphos med rx, or alternative med rx or endocrine consultation; only agrees to take Calcium & Vits despite the potential severe consequences...    Anxiety, Insomnia> on Tranxene7.5-1/2Tid prn, Tofranil25-2Qhs...     Anemia> Lower GIB 7/15 believed due to divertics; Hg nadir= 8.8, she is INTOL to Fe w/ constip, rec to take [facility] + Miralax/ Senakot-S... We reviewed prob list, meds, xrays and labs> see below for updates >>   EKG 7/15 showed NSR, rate75, inferior scar & abn r progression...   LABS in hosp 7/15:  Chems- wnl;  CBC- Hg in the 9/10 range;  UA w/ +WBC, RBC, bact & Cult +EColi sens Levaquin...  LABS in office 7/15:  CBC- Hg=9.8, Fe=61 (17%);  TSH=0.95;  Vit B12= 588;  Vit D= 80...   ~  December 04, 2013:  54mo ROV & Lisa King turned 82 y/o- stable overall w/ mult somatic complaints; c/o giat abn & balance issues w/ dizziness and intermittent BPPV symptoms- she has Meclizine for prn use 7 we discussed the ELPEY maneuver to help as well, reminded to continue ASA81/d... We reviewed the following medical problems during today's office visit >>     Hearing Loss> she has hearing aides from [facility] & prev cerumen impactions removed; I inspected EACs & they are clear today...    COPD, ex Cig  smoker> states her last cig was 02/15/12; still refuses breathing meds; states her breathing is good, notes energy fair, & denies cough, sput, hemoptysis, SOB, etc; she is too sedentary & needs to incr exercise...    CHOL> on Fish Oil; FLP 7/14 was improved on diet alone- TChol 191, TG 107, HDL 61, LDL 109... She is not fasting for f/u labs today...    GI- GERD, Divertics, Constip, Polyps, hems> on OTC Pepcid20 (prev refused to take the Omep40) & Miralax/ Senakot-S/ Suppos; Adm 7/15 w/ divertic bleed, self-lim, hemodynam stable, Hg nadir= 8.8; placed on Fe, Prilosec, Miralax & asked to stop ASA, NSAIDs; she refuses Fe due to constip, refuses Prilosec ("I take Pepcid"), still  taking her ASA; asked to try Ferrosequels, Miralax daily, Senakot-S Qhs, Suppos prn; she had f/u Colonoscopy 9/15 DrGessner- mod divertics, melanosis, redundant colon, no polyps...     GU> she had a UTI w/ [facility] 7/15 Hosp; treated w/ Levaquin & resolved...     DJD, FM> on Aleve prn, Flexeril10Tid prn, BorageOil & "tart cherry advanced"; states that air conditioning makes her neck arthritis worse; advised heating pad & anti-inflamm meds prn only...    Osteoporosis> on Calcium, MVI; BMD was -3.5 in spine in 2007 & she has repeatedly refused f/u BMD, Bisphos med rx, or alternative med rx or endocrine consultation; only agrees to take Calcium & Vits despite the potential severe consequences!!!    Anxiety, Insomnia> on Tranxene7.5-1/2Tid prn, Tofranil25-2Qhs...     Anemia> Lower GIB 7/15 believed due to divertics; Hg nadir= 8.8, she is INTOL to Fe w/ constip, rec to take [facility] + Miralax/ Senakot-S/ etc... We reviewed prob list, meds, xrays and labs> see below for updates >>   LABS 10/15:  CBC- Hg=12.5, MCV=90, Fe=97 (25%sat), Ferritin=18.6.Marland KitchenMarland Kitchen  ~  July 29, 2014:  8 month ROV & recheck> Lisa King returns feeling well w/o new complaints or concerns except an eyelash mite being treated by [employer];  She has NOT been smoking and denies  cough, sputum, SOB, CP, etc;  She exercises by walking (but not regularly) & yard work- we discussed joining a Retail banker...  She has not yet chosen a new PCP and she is not fasting today...     COPD, ex-smoker> not on breathing meds and she likes it this way; denies cough, sputum, SOB...    Chol> on diet + Fish oil; last FLP was 7/14 but she insists it was done last yr (nothing in Epic); reminded to come fasting for her appt for labs...    GI> GERD, Divertics, constip, polyps, hems> she is stable & asymptomatic on Prilosec/ Pepcid, Colace/ Miralax w/o heartburn, dysphagia, etc and normal daily BMs at present, no blood seen...    GU> stable w/o dysuria, feels that she emppties adeq, etc...     Osteop> on calcium, MVI, VitD, etc; she has 2 compression fxs, she continues to decline f/u BMD or discussion about Bisphos or alternative rx...     Anxiety/ insomnia> on Tranxene7.5Bid and Imipramine25-2Qhs; she wants to add a new OTC sleep aide called Somnapure & I rec she discuss w/ her pharm... PLAN>>  She requests refills for Imipramine rx, otherw doing satis & she is rec to increase her exercise program...  ~  March 04, 2015:  43mo ROV & Lisa King reports breathing well, no new complaints or concerns; she is considering assisted living but doesn't want to move... we reviewed the following medical problems during today's office visit >>     Hearing Loss> she has hearing aides from [facility] & prev cerumen impactions removed; I inspected EACs & they are clear today...    COPD, ex Cig smoker> states her last cig was 02/15/12; still refuses breathing meds; states her breathing is good, notes energy fair, & denies cough, sput, hemoptysis, SOB, etc; she is too sedentary, walks some at the mall, & needs to incr exercise program...    CHOL> on Fish Oil; FLP 12/16 was improved on diet alone- TChol 186, TG 101, HDL 100, LDL 66... She is not fasting for f/u labs today...    GI- GERD, Divertics, Constip,  Polyps, hems> on OTC Pepcid20 (prev refused to take the Omep40) & Miralax/ Senakot-S/  Suppos; Adm 7/15 w/ divertic bleed, self-lim, hemodynam stable, Hg nadir= 8.8; placed on Fe, Prilosec, Miralax & asked to stop ASA, NSAIDs; she refuses Fe due to constip, refuses Prilosec ("I take Pepcid"), still taking her ASA; asked to try Ferrosequels, Miralax daily, Senakot-S Qhs, Suppos prn; she had f/u Colonoscopy 9/15 DrGessner- mod divertics, melanosis, redundant colon, no polyps...     GU> she had a UTI w/ [facility] 7/15 Hosp; treated w/ Levaquin & resolved...     DJD, FM> on Aleve prn, off Flexeril, BorageOil & "tart cherry advanced"; states that air conditioning makes her neck arthritis worse; advised heating pad & anti-inflamm meds prn only...    Osteoporosis> on Calcium, MVI; BMD was -3.5 in spine in 2007 & she has repeatedly refused f/u BMD, Bisphos med rx, or alternative med rx or endocrine consultation; only agrees to take Calcium & Vits despite the potential severe consequences!!!    Anxiety, Insomnia> on Tranxene7.5-1/2Tid prn, Tofranil25-2Qhs "for my FM"; offered Rx for Trazadone50 for sleep...     Anemia> Lower GIB 7/15 believed due to divertics; Hg nadir= 8.8, she is INTOL to Fe w/ constip, rec to take [facility] + Miralax/ Senakot-S/ etc; Hg improved to 12.5 (2015 & 2016)... EXAM shows Afeb, VSS, O2sat=96% on RA;  Heent- neg, mallampati1;  Chest- clear w/o w/r/r;  Heart- RR w/o m/r/g;  Abd- soft, neg;  Ext- neg w/o c/c/e, Neuro- intact...  IMP/PLAN>>  Lisa King remains reasonably stable; he labs from 01/23/15 look good; she is c/o insomnia & the Imipramine50 +- Tranxene7.5 don't seem to help; Rec that she try TRAZODONE50mg  Qhs as a trial...           Problem List:   HEARING LOSS (ICD-389.9) - hx bilat stapes surg for hearing problems by DrKraus yrs ago... states she can't have MRI's due to the stapes implants... now sees DrBates w/ hearing aides bilat... ~  3/14:  She had f/u DrBates> URI & cerumen  impactions- removed + conservative therapy...  ~  7/15:  She has hearing aides from ENT...  COPD (ICD-496) - states her breathing is OK and she denies SOB, and declines use of inhalers, ex= walking... She denies cough, sputum, hemoptysis, CP, palpit SOB, edema... she has declined to do PFT's in the past... ~  CXR 7/11 showed COPD, atherosclerotic calcif in Ao, osteopenic bones, compression of 2 mid thor vertebral bodies, NAD.Marland Kitchen. ~  CXR 7/12 showed hyperinflation, some apical pleural scarring, sm HH, mitral annular calcif, NAD.Marland Kitchen. ~  CXR 7/13 showed heart at the upper lim of norm in size, lungs are clear/ overinflated c/w COPD, apical pleural thickening, NAD...  CIGARETTE SMOKER (ICD-305.1) - states she's quit smoking now "but I cheat now & again"... she refuses Chantix stating that a friend tried it and died!!! We discussed other smoking cessation strategies... ~  7/13:  She states "I don't buy them anymore" but smokes now & then when she can get them... ~  States she quit 02/15/12 & denies on-going resp symptoms of cough, sput, hemoptysis, CP, dyspnea, edema, etc...  HYPERCHOLESTEROLEMIA (ICD-272.0) - she has refused statin Rx in the past... tried Simvastatin 20mg /d 5/09 but states that she developed "muscle cramps, throat closing & insomnia" which resolved off this med- refuses to try other meds "I'm on Fish Oil & diet"... ~  FLP 11/07 showed Tchol 214, TG 130, HDL 51, LDL 125... refused statin, perfers diet alone... ~  FLP 5/09 showed TChol 228, TG 200, HDL 48, LDL 139... rec- start Simva20.Marland KitchenMarland Kitchen  pt DC'd it. ~  FLP 7/10 showed TChol 219, TG 171, HDL 54, LDL 127... refuses Fibrate or other lipid meds. ~  FLP 6/11 showed TChol 234, TG 166, HDL 55, LDL 133... discussed diet Rx. ~  FLP 6/12 showed TChol 196, TG 134, HDL 60, LDL 110... Improved on diet Rx! ~  FLP 7/13 showed TChol 219, TG 139, HDL 68, LDL 115... Offered low dose statin but she declines... ~  FLP 7/14 on diet alone showed TChol 191, TG  107, HDL 61, LDL 109... Improved on diet alone.  GERD (ICD-530.81)  -  on PEPCID 20mg Qd but she is adamant that FAMOTIDINE works much better than Pepcid... followed for GI by DrGessner...  DIVERTICULOSIS OF COLON (ICD-562.10) - she denies D, C, blood, ch in bowel habits... COLONIC POLYPS (ICD-211.3) - colonoscopy 7/06 showed several 4-37mm adenomatous polyps in a long redundant colon w/ divertics & hems... f/u colonoscopy 10/09 showed divertics, several polyps= adenomatous & hyperpl w/ f/u colon planned for 60yrs... HEMORRHOIDS (ICD-455.6) ~  2014> she is due for a f/u w/ DrGessner re: f/u colonoscopy => she never sched this f/u visit... ~  7/15: Hosp by [facility] w/ Lower GIB believed to be a diverticv bleed, self limited, hemodynamically stable, Hg~9 range, & set up for outpt f/u by [employer]... ~  Colonoscopy 9/15 by DrGessner showed mod sigmoid divertics, no polyps, extensive melanosis, redundant colon...  ~  10/15: on Pepcid20, not taking Omep; on Miralax prn & advised regular dosing, plus Senakot-S Qhs, and suppos prn; her CC is arthritis and still takes NSAIDs regularly- advised prn dosing & regular Omeprazole40 30 min before the 1st meal each day...  DEGENERATIVE JOINT DISEASE (ICD-715.90) - hx of leg pain w/ norm ABI's 2007... eval by Janett Billow for Ortho w/ "arthritis and scoliosis" treated w/ muscle relaxer and improved... she states she can't get MRI due to prev stapes surg... ~  7/11: she notes that black cherry concentrate is good "it took a freind's pain away"... ~  Arthritis is her CC> on Aleve regularly + Tylenol prn; advised intermittwent prn dosing f NSAIDs and use the Omep40 daily...  FIBROMYALGIA (ICD-729.1) - on IMIPRAMINE 25mg - 2tabs daily, + Flexeril10Tid prn... ~  7/12:  She notes that her FM pain was exac by the dog bite 5/12...  OSTEOPOROSIS (ICD-733.00) - known severe osteoporosis w/ BMD 11/07 showing TScores -3.5 in spine & -2.6 in left fem neck... pt states intol to [facility]  and refused other bisphosphonates or miacalcin... in addition she has refuses Endocrine referral to discuss further... "I take calcium and vitamins" but I carefully explained how that is not sufficient and that she is likely to suffer a serious compression fracture of her sp, or a fall could easily fracture her hip- she refuses Rx or further help... I have advised her to talk to her GYN about this as well. ~  7/10:  We discussed osteoporosis options again including bisphos, reclast, miacalcin, forteo, & the new prolia- she is not interested in Rx or in Endocrine second opinion... ~  7/11:  Ditto... ~  7/12:  I reviewed all the above & offered the new ATELVIA 35mg /wk (fewer GI problems) and she agreed to try it, Rx written==> stopped on her own. ~  7/13:  We reviewed the situation w/ her bones, osteoporosis & her risks; she again refuses meds for this problem, and declines offers for subspec referral (Endocrine, etc)... ~  7/14:  She once again confirms her staunch refusal to consider f/u  BMD, med rx of any kind for her osteoporosis, or 2nd opinion consultation... ~  Labs 7/15 showed Vit D = 80...  ANXIETY (ICD-300.00)- she takes Tranxene7.5 at bedtime to help her sleep...  ANEMIA>  She was [facility] 7/15 w/ Lower GIB believed to be due to divertics, managed conservatively & referred to GI as outpt for f/u... ~  Baseline labs showed Hg ~12 range... ~  Labs 7/15 in hosp showed Hg= 9-10 range w/ nadir=8.8; placed on Fe at disch but poorly tolerated.... ~  Labs 7/15 in office f/u showed Hg= 9.8, Fe= 61 (17%sat), B12= 588... rec to try Ferrosequels... ~  Labs 10/15 showed Hg= 12.5, Fe= 97 (25%sat), Ferritin= 19... 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 she takes ASA 81mg /d... had Tetanus shot 1997 & Pneumovax 2000... she refuses Flu shots! ~  Supplement meds include> Calc w/ D, MVI, Tums, FishOil, BorageOil, FlaxSeedOil, etc... ~  10/15: pt asked to f/u in 66mo w/  Fasting labs, but she refused- wants routine ROV in 54yr...   Past Surgical History  Procedure Laterality Date  . Tonsillectomy and adenoidectomy      as a child  . Stapes surgery      Dr Dorma Russell  . Cataract extraction, bilateral  2009  . Colonoscopy  2009, 2006     Outpatient Encounter Prescriptions as of 03/04/2015  Medication Sig  . aspirin 81 MG tablet Take 81 mg by mouth daily.  . Calcium Carbonate-Vitamin D (CALTRATE 600+D) 600-400 MG-UNIT per tablet Take 1 tablet by mouth daily.    . cholecalciferol (VITAMIN D) 1000 UNITS tablet Take 1,000 Units by mouth daily.  . clorazepate (TRANXENE) 7.5 MG tablet Take 1 tablet (7.5 mg total) by mouth 2 (two) times daily as needed. for anxiety  . docusate sodium (COLACE) 100 MG capsule Take 100 mg by mouth daily.   . famotidine (PEPCID) 20 MG tablet TAKE ONE TABLET BY MOUTH ONCE DAILY  . fish oil-omega-3 fatty acids 1000 MG capsule Take 2 g by mouth daily.    Marland Kitchen imipramine (TOFRANIL) 25 MG tablet TAKE TWO TABLETS BY MOUTH ONCE DAILY  . meclizine (ANTIVERT) 25 MG tablet Take 1 tablet (25 mg total) by mouth 4 (four) times daily as needed for dizziness.  . naproxen sodium (ANAPROX) 220 MG tablet Take 220 mg by mouth at bedtime.  . polyethylene glycol (MIRALAX / GLYCOLAX) packet Take 17 g by mouth daily.    . sennosides-docusate sodium (SENOKOT-S) 8.6-50 MG tablet Take 1 tablet by mouth daily.  . metroNIDAZOLE (METROCREAM) 0.75 % cream Apply topically 2 (two) times daily.  . mupirocin ointment (BACTROBAN) 2 % Apply topically 2 (two) times daily.  . traZODone (DESYREL) 50 MG tablet Take 1 tablet (50 mg total) by mouth at bedtime as needed for sleep.  . [DISCONTINUED] ciprofloxacin (CIPRO) 500 MG tablet Take 1 tablet (500 mg total) by mouth 2 (two) times daily. (Patient not taking: Reported on 03/04/2015)  . [DISCONTINUED] cyclobenzaprine (FLEXERIL) 10 MG tablet Take 10 mg by mouth 3 (three) times daily as needed for muscle spasms. Reported on 03/04/2015    . [DISCONTINUED] Misc Natural Products (TART CHERRY ADVANCED) CAPS Take 1 capsule by mouth daily. Reported on 03/04/2015   No facility-administered encounter medications on file as of 03/04/2015.    Allergies  Allergen Reactions  . Penicillins Anaphylaxis  . Bisphosphonates Other (See Comments)    [name]  . Influenza Vaccines  Pt reported  . Simvastatin     Unknown   . Sulfonamide Derivatives Rash and Other (See Comments)    blisters    Current Medications, Allergies, Past Medical History, Past Surgical History, Family History, and Social History were reviewed in [employer] record.   Review of Systems       See HPI - all other systems neg except as noted...       The patient complains of dyspnea on exertion, severe indigestion/heartburn, muscle weakness, and difficulty walking.  The patient denies anorexia, fever, weight loss, weight gain, vision loss, decreased hearing, hoarseness, chest pain, syncope, peripheral edema, prolonged cough, headaches, hemoptysis, abdominal pain, melena, hematochezia, hematuria, incontinence, suspicious skin lesions, transient blindness, depression, unusual weight change, abnormal bleeding, enlarged lymph nodes, and angioedema.     Objective:   Physical Exam    WD, WN, 82 y/o WF in NAD... GENERAL:  Alert & oriented; pleasant & cooperative... HEENT:  [place]/AT, EOM-wnl, PERRLA, EACs-clear, TMs-wnl, NOSE-clear, THROAT-clear & wnl. NECK:  Supple w/ fairROM; no JVD; normal carotid impulses w/o bruits; no thyromegaly or nodules palpated; no lymphadenopathy. CHEST:  decr BS bilat, clear to P & A; without wheezes/ rales/ or rhonchi heard... HEART:  Regular Rhythm; without murmurs/ rubs/ or gallops detected... ABDOMEN:  Soft & nontender; normal bowel sounds; no organomegaly or masses palpated... EXT: without deformities, mod arthritic changes; no varicose veins/ +venous insuffic/ tr edema. NEURO:  CN's intact; no focal neuro  deficits... DERM:  No lesions noted; prev nasal skin surg; no rash etc...  RADIOLOGY DATA:  Reviewed in the EPIC EMR & discussed w/ the patient...  LABORATORY DATA:  Reviewed in the EPIC EMR & discussed w/ the patient...   Assessment & Plan:    COPD>  she's quit smoking & denies SOB etc but she is clearly too sedentary & asked to incr activity/ exercise...  CHOL>  On diet alone & last FLP was improved & close to goals...   GI>  GERD/ Divertics/ Polyps>  Followed by Rodena Medin for GI; stable on Famotidine which she swears works better that [employer]; due for 39yr f/u colon in 2014... GIB, likely divertic bleed 7/15> self limited, feeling better, c/o chr constip & rec to take Miralax & Senakot-S (but she wants Linzess- like her brother); referred to GI for f/u eval & to sched colon=> done 9/15 Gessner-  divertics, melanosis, redundant colon...  DJD/ FM>  Stable overall; uses OTC anti-inflamm as needed + Imipramine Qhs...  Osteoporosis/ Compression fx>  She has 2 compression fx that are old, she is very high risk, we had another long conversation about her osteoporosis & the risk it imposes, we reviewed the avail therapies (again) but she declines all avail therapies & refuses second opinion consult etc...   Anxiety>  She uses the Chlorazepate as needed...  Anemia> due to GIB; f/u Hg=9.8, Fe=61, and rec to take FERROSEQUELS=>  Follow up labs 10/15 w/ Hg=12.5, Fe=97...   Patient's Medications  New Prescriptions   TRAZODONE (DESYREL) 50 MG TABLET    Take 1 tablet (50 mg total) by mouth at bedtime as needed for sleep.  Previous Medications   ASPIRIN 81 MG TABLET    Take 81 mg by mouth daily.   CALCIUM CARBONATE-VITAMIN D (CALTRATE 600+D) 600-400 MG-UNIT PER TABLET    Take 1 tablet by mouth daily.     CHOLECALCIFEROL (VITAMIN D) 1000 UNITS TABLET    Take 1,000 Units by mouth daily.   CLORAZEPATE (TRANXENE) 7.5 MG  TABLET    Take 1 tablet (7.5 mg total) by mouth 2 (two) times daily as needed.  for anxiety   DOCUSATE SODIUM (COLACE) 100 MG CAPSULE    Take 100 mg by mouth daily.    FAMOTIDINE (PEPCID) 20 MG TABLET    TAKE ONE TABLET BY MOUTH ONCE DAILY   FISH OIL-OMEGA-3 FATTY ACIDS 1000 MG CAPSULE    Take 2 g by mouth daily.     IMIPRAMINE (TOFRANIL) 25 MG TABLET    TAKE TWO TABLETS BY MOUTH ONCE DAILY   MECLIZINE (ANTIVERT) 25 MG TABLET    Take 1 tablet (25 mg total) by mouth 4 (four) times daily as needed for dizziness.   METRONIDAZOLE (METROCREAM) 0.75 % CREAM    Apply topically 2 (two) times daily.   MUPIROCIN OINTMENT (BACTROBAN) 2 %    Apply topically 2 (two) times daily.   NAPROXEN SODIUM (ANAPROX) 220 MG TABLET    Take 220 mg by mouth at bedtime.   POLYETHYLENE GLYCOL (MIRALAX / GLYCOLAX) PACKET    Take 17 g by mouth daily.     SENNOSIDES-DOCUSATE SODIUM (SENOKOT-S) 8.6-50 MG TABLET    Take 1 tablet by mouth daily.  Modified Medications   No medications on file  Discontinued Medications   CIPROFLOXACIN (CIPRO) 500 MG TABLET    Take 1 tablet (500 mg total) by mouth 2 (two) times daily.   CYCLOBENZAPRINE (FLEXERIL) 10 MG TABLET    Take 10 mg by mouth 3 (three) times daily as needed for muscle spasms. Reported on 03/04/2015   MISC NATURAL PRODUCTS (TART CHERRY ADVANCED) CAPS    Take 1 capsule by mouth daily. Reported on 03/04/2015

## 2015-03-09 ENCOUNTER — Other Ambulatory Visit: Payer: Self-pay | Admitting: Pulmonary Disease

## 2015-03-28 ENCOUNTER — Other Ambulatory Visit: Payer: Self-pay | Admitting: Pulmonary Disease

## 2015-04-17 ENCOUNTER — Other Ambulatory Visit: Payer: Self-pay | Admitting: Pulmonary Disease

## 2015-04-29 DIAGNOSIS — L308 Other specified dermatitis: Secondary | ICD-10-CM | POA: Diagnosis not present

## 2015-06-23 ENCOUNTER — Other Ambulatory Visit: Payer: Self-pay | Admitting: Pulmonary Disease

## 2015-06-24 ENCOUNTER — Telehealth: Payer: Self-pay | Admitting: Pulmonary Disease

## 2015-06-24 DIAGNOSIS — Z961 Presence of intraocular lens: Secondary | ICD-10-CM | POA: Diagnosis not present

## 2015-06-24 DIAGNOSIS — H52203 Unspecified astigmatism, bilateral: Secondary | ICD-10-CM | POA: Diagnosis not present

## 2015-06-24 NOTE — Telephone Encounter (Signed)
Left message for patient to call back  

## 2015-06-25 MED ORDER — CYCLOBENZAPRINE HCL 10 MG PO TABS: 10.0000 mg | ORAL_TABLET | Freq: Three times a day (TID) | ORAL | Status: DC | PRN

## 2015-06-25 NOTE — Telephone Encounter (Signed)
LMTCB

## 2015-06-25 NOTE — Telephone Encounter (Signed)
Spoke with the pt  She is requesting a refill on flexeril 10 mg  She states she had an old bottle that SN prescribed a long time ago that is not expired  She worked in her yard a couple days ago and thinks she may have overdid it, and now her lower back is hurting  She thought flexeril may help with this  Please advise thanks! Allergies  Allergen Reactions  . Penicillins Anaphylaxis  . Bisphosphonates Other (See Comments)    [name]  . Influenza Vaccines     Pt reported  . Simvastatin     Unknown   . Sulfonamide Derivatives Rash and Other (See Comments)    blisters   Current Outpatient Prescriptions on File Prior to Visit  Medication Sig Dispense Refill  . aspirin 81 MG tablet Take 81 mg by mouth daily.    . Calcium Carbonate-Vitamin D (CALTRATE 600+D) 600-400 MG-UNIT per tablet Take 1 tablet by mouth daily.      . cholecalciferol (VITAMIN D) 1000 UNITS tablet Take 1,000 Units by mouth daily.    . clorazepate (TRANXENE) 7.5 MG tablet TAKE ONE TABLET BY MOUTH TWICE DAILY AS NEEDED FOR ANXIETY 30 tablet 2  . docusate sodium (COLACE) 100 MG capsule Take 100 mg by mouth daily.     . famotidine (PEPCID) 20 MG tablet TAKE ONE TABLET BY MOUTH ONCE DAILY 30 tablet 5  . fish oil-omega-3 fatty acids 1000 MG capsule Take 2 g by mouth daily.      Marland Kitchen imipramine (TOFRANIL) 25 MG tablet TAKE TWO TABLETS BY MOUTH ONCE DAILY 60 tablet 5  . meclizine (ANTIVERT) 25 MG tablet Take 1 tablet (25 mg total) by mouth 4 (four) times daily as needed for dizziness. 30 tablet 0  . metroNIDAZOLE (METROCREAM) 0.75 % cream Apply topically 2 (two) times daily.  0  . mupirocin ointment (BACTROBAN) 2 % Apply topically 2 (two) times daily.  0  . naproxen sodium (ANAPROX) 220 MG tablet Take 220 mg by mouth at bedtime.    . polyethylene glycol (MIRALAX / GLYCOLAX) packet Take 17 g by mouth daily.      . sennosides-docusate sodium (SENOKOT-S) 8.6-50 MG tablet Take 1 tablet by mouth daily.    . traZODone (DESYREL)  50 MG tablet Take 1 tablet (50 mg total) by mouth at bedtime as needed for sleep. 15 tablet 0   No current facility-administered medications on file prior to visit.

## 2015-06-25 NOTE — Telephone Encounter (Signed)
Per SN:  Okay to refill Flexeril 10mg , #50, 1po every 8 hr prn muscle spasm

## 2015-06-25 NOTE — Telephone Encounter (Signed)
Spoke with pt and advised that SN had approved refill request. Advised pt to contact office if not improving. Rx sent to [facility]. Nothing further needed.

## 2015-09-01 ENCOUNTER — Encounter: Payer: Self-pay | Admitting: Pulmonary Disease

## 2015-09-01 ENCOUNTER — Ambulatory Visit (INDEPENDENT_AMBULATORY_CARE_PROVIDER_SITE_OTHER): Payer: Medicare Other | Admitting: Pulmonary Disease

## 2015-09-01 VITALS — BP 130/82 | HR 77 | Temp 97.2°F | Ht 62.0 in | Wt 132.1 lb

## 2015-09-01 DIAGNOSIS — M150 Primary generalized (osteo)arthritis: Secondary | ICD-10-CM | POA: Diagnosis not present

## 2015-09-01 DIAGNOSIS — K219 Gastro-esophageal reflux disease without esophagitis: Secondary | ICD-10-CM

## 2015-09-01 DIAGNOSIS — M609 Myositis, unspecified: Secondary | ICD-10-CM

## 2015-09-01 DIAGNOSIS — IMO0001 Reserved for inherently not codable concepts without codable children: Secondary | ICD-10-CM

## 2015-09-01 DIAGNOSIS — K5901 Slow transit constipation: Secondary | ICD-10-CM | POA: Diagnosis not present

## 2015-09-01 DIAGNOSIS — Z23 Encounter for immunization: Secondary | ICD-10-CM

## 2015-09-01 DIAGNOSIS — J449 Chronic obstructive pulmonary disease, unspecified: Secondary | ICD-10-CM | POA: Diagnosis not present

## 2015-09-01 DIAGNOSIS — M791 Myalgia: Secondary | ICD-10-CM

## 2015-09-01 DIAGNOSIS — M159 Polyosteoarthritis, unspecified: Secondary | ICD-10-CM

## 2015-09-01 DIAGNOSIS — F411 Generalized anxiety disorder: Secondary | ICD-10-CM

## 2015-09-01 DIAGNOSIS — M810 Age-related osteoporosis without current pathological fracture: Secondary | ICD-10-CM

## 2015-09-01 NOTE — Patient Instructions (Signed)
Today we updated your med list in our EPIC system...    Continue your current medications the same...  Today we gave you the 2nd pneumonia vaccine> [id number]    This should be the last pneumonia shot that you will need...  We will arange for an orthopedic consult w/ a foot specialist at [facility]...  Call for any questions...  Let's plan a follow up visit in 62mo, sooner if needed for acute problems.Marland KitchenMarland Kitchen

## 2015-09-01 NOTE — Progress Notes (Signed)
Subjective:    Patient ID: Lisa King, female    DOB: 12-Nov-1931, 82 y.o.   MRN: [id number]  HPI 82 y/o WF here for a follow up visit... she has mult med problems as noted...  Followed for general medical purposes w/ hx of COPD, Hyperchol, GERD/ Diverics/ Polyps, DJD/ FM/ Osteoporosis, and anxiety... she likes to take various supplements, and she is also followed by Lisa King for GI, and Lisa King for Ortho... ~  SEE PREV EPIC NOTES FOR OLDER DATA>>   CXR 7/13 showed heart at the upper lim of norm in size, lungs are clear/ overinflated c/w COPD, apical pleural thickening, NAD...  LABS 7/13:  FLP- not at goals on diet alone w/ TChol 219 LDL=115;  Chems- wnl;  CBC- wnl;  TSH=1.38;  VitD=52   LABS 7/14:  FLP- improved on diet alone;  Chems- wnl;  CBC- ok w/ Hg=11.7 MCV=93;  TSH=1.06;  VitD=61 ~  Lisa King was [facility] by [facility] 7/9 - 08/25/13 w/ a lower GIB believed to be a self-limited divertic bleed that resolved on it's own;  Hx GERD, divertics, polyps, constip, and hems;  Followed by Lisa King on Famotidine20, & Miralax, last colon 10/09 showed divertics and several polyps (adenomatous & hyperplastic), f/u suggested in 90yrs & now overdue;  She presented w/ rectal bleeding- dark red blood- but no abd pain, no n/v, not lightheaded or syncopal, hemodynamically stable w/o hypotension & Hg in the 9-10 range;  She was seen by Lisa King who rec conservative approach & outpt f/u to arrange for her needed colonoscopy...  EKG 7/15 showed NSR, rate75, inferior scar & abn r progression...   LABS in hosp 7/15:  Chems- wnl;  CBC- Hg in the 9/10 range;  UA w/ +WBC, RBC, bact & Cult +EColi sens Levaquin...  LABS in office 7/15:  CBC- Hg=9.8, Fe=61 (17%);  TSH=0.95;  Vit B12= 588;  Vit D= 80...   ~  December 04, 2013:  65mo ROV & [name] turned 82 y/o- stable overall w/ mult somatic complaints; c/o gait abn & balance issues w/ dizziness and intermittent BPPV symptoms- she has Meclizine for prn use 7 we discussed the ELPEY  maneuver to help as well, reminded to continue ASA81/d... We reviewed the following medical problems during today's office visit >>     Hearing Loss> she has hearing aides from [facility] & prev cerumen impactions removed; I inspected EACs & they are clear today...    COPD, ex Cig smoker> states her last cig was 02/15/12; still refuses breathing meds; states her breathing is good, notes energy fair, & denies cough, sput, hemoptysis, SOB, etc; she is too sedentary & needs to incr exercise...    CHOL> on Fish Oil; FLP 7/14 was improved on diet alone- TChol 191, TG 107, HDL 61, LDL 109... She is not fasting for f/u labs today...    GI- GERD, Divertics, Constip, Polyps, hems> on OTC Pepcid20 (prev refused to take the Omep40) & Miralax/ Senakot-S/ Suppos; Adm 7/15 w/ divertic bleed, self-lim, hemodynam stable, Hg nadir= 8.8; placed on Fe, Prilosec, Miralax & asked to stop ASA, NSAIDs; she refuses Fe due to constip, refuses Prilosec ("I take Pepcid"), still taking her ASA; asked to try Ferrosequels, Miralax daily, Senakot-S Qhs, Suppos prn; she had f/u Colonoscopy 9/15 Lisa King- mod divertics, melanosis, redundant colon, no polyps...     GU> she had a UTI w/ [facility] 7/15 Hosp; treated w/ Levaquin & resolved...     DJD, FM> on Aleve prn, Flexeril10Tid prn, BorageOil & "  tart cherry advanced"; states that air conditioning makes her neck arthritis worse; advised heating pad & anti-inflamm meds prn only...    Osteoporosis> on Calcium, MVI; BMD was -3.5 in spine in 2007 & she has repeatedly refused f/u BMD, Bisphos med rx, or alternative med rx or endocrine consultation; only agrees to take Calcium & Vits despite the potential severe consequences!!!    Anxiety, Insomnia> on Tranxene7.5-1/2Tid prn, Tofranil25-2Qhs...     Anemia> Lower GIB 7/15 believed due to divertics; Hg nadir= 8.8, she is INTOL to Fe w/ constip, rec to take [facility] + Miralax/ Senakot-S/ etc... We reviewed prob list, meds, xrays and labs>  see below for updates >>   LABS 10/15:  CBC- Hg=12.5, MCV=90, Fe=97 (25%sat), Ferritin=18.6.Marland KitchenMarland Kitchen  ~  July 29, 2014:  8 month ROV & recheck> Lisa King returns feeling well w/o new complaints or concerns except an eyelash mite being treated by [employer];  She has NOT been smoking and denies cough, sputum, SOB, CP, etc;  She exercises by walking (but not regularly) & yard work- we discussed joining a Retail banker...  She has not yet chosen a new PCP and she is not fasting today...     COPD, ex-smoker> not on breathing meds and she likes it this way; denies cough, sputum, SOB...    Chol> on diet + Fish oil; last FLP was 7/14 but she insists it was done last yr (nothing in Epic); reminded to come fasting for her appt for labs...    GI> GERD, Divertics, constip, polyps, hems> she is stable & asymptomatic on Prilosec/ Pepcid, Colace/ Miralax w/o heartburn, dysphagia, etc and normal daily BMs at present, no blood seen...    GU> stable w/o dysuria, feels that she emppties adeq, etc...     Osteop> on calcium, MVI, VitD, etc; she has 2 compression fxs, she continues to decline f/u BMD or discussion about Bisphos or alternative rx...     Anxiety/ insomnia> on Tranxene7.5Bid and Imipramine25-2Qhs; she wants to add a new OTC sleep aide called [employer] & I rec she discuss w/ her pharm... PLAN>>  She requests refills for Imipramine rx, otherw doing satis & she is rec to increase her exercise program...  ~  March 04, 2015:  42mo ROV & Lisa King reports breathing well, no new complaints or concerns; she is considering assisted living but doesn't want to move... we reviewed the following medical problems during today's office visit >>     Hearing Loss> she has hearing aides from [facility] & prev cerumen impactions removed; I inspected EACs & they are clear today...    COPD, ex Cig smoker> states her last cig was 02/15/12; still refuses breathing meds; states her breathing is good, notes energy fair, & denies cough,  sput, hemoptysis, SOB, etc; she is too sedentary, walks some at the mall, & needs to incr exercise program...    CHOL> on Fish Oil; FLP 12/16 was improved on diet alone- TChol 186, TG 101, HDL 100, LDL 66... She is not fasting for f/u labs today...    GI- GERD, Divertics, Constip, Polyps, hems> on OTC Pepcid20 (prev refused to take the Omep40) & Miralax/ Senakot-S/ Suppos; Adm 7/15 w/ divertic bleed, self-lim, hemodynam stable, Hg nadir= 8.8; placed on Fe, Prilosec, Miralax & asked to stop ASA, NSAIDs; she refuses Fe due to constip, refuses Prilosec ("I take Pepcid"), still taking her ASA; asked to try Ferrosequels, Miralax daily, Senakot-S Qhs, Suppos prn; she had f/u Colonoscopy 9/15 Lisa King- mod divertics, melanosis, redundant  colon, no polyps...     GU> she had a UTI w/ [facility] 7/15 Hosp; treated w/ Levaquin & resolved...     DJD, FM> on Aleve prn, off Flexeril, BorageOil & "tart cherry advanced"; states that air conditioning makes her neck arthritis worse; advised heating pad & anti-inflamm meds prn only...    Osteoporosis> on Calcium, MVI; BMD was -3.5 in spine in 2007 & she has repeatedly refused f/u BMD, Bisphos med rx, or alternative med rx or endocrine consultation; only agrees to take Calcium & Vits despite the potential severe consequences!!!    Anxiety, Insomnia> on Tranxene7.5-1/2Tid prn, Tofranil25-2Qhs "for my FM"; offered Rx for Trazadone50 for sleep...     Anemia> Lower GIB 7/15 believed due to divertics; Hg nadir= 8.8, she is INTOL to Fe w/ constip, rec to take [facility] + Miralax/ Senakot-S/ etc; Hg improved to 12.5 (2015 & 2016)... EXAM shows Afeb, VSS, O2sat=96% on RA;  Heent- neg, mallampati1;  Chest- clear w/o w/r/r;  Heart- RR w/o m/r/g;  Abd- soft, neg;  Ext- neg w/o c/c/e, Neuro- intact...   LABS 01/2015>  FLP- at goals on diet alone;  Chems- wnl;  CBC- wnl;  Fe=86 (23%sat);  TSH-1.89... IMP/PLAN>>  Devine remains reasonably stable; he labs from 01/23/15 look good; she is  c/o insomnia & the Imipramine50 +- Tranxene7.5 don't seem to help; Rec that she try TRAZODONE50mg  Qhs as a trial...   ~  September 01, 2015:  66mo ROV & Lisa King indicates "I'm hanging in there" soon to be 82 y/o & her CC today is allergy to lillie flowers & we discussed decreasing exposure + OTC antihist prn;  In addition she is c/o overriding toe removed by Ortho (left 2nd toe overrides the big toe) & we will refer to Ortho- DrHewitt... we reviewed the following medical problems during today's office visit >>     Hearing Loss> she has hearing aides from [facility] & prev cerumen impactions removed; I inspected EACs & they are clear today...    COPD, ex cig smoker> states her last cig was 02/15/12; still refuses breathing meds/ inhalers; states her breathing is good, notes energy fair, & denies cough, sput, hemoptysis, SOB, etc; she is too sedentary, walks some at the mall, & needs to incr exercise program...    CHOL> on Fish Oil; FLP 12/16 was improved on diet alone- TChol 186, TG 101, HDL 100, LDL 66... She is not fasting for f/u labs today...    GI- GERD, Divertics (w/ hx GIB 2015), Constip, Polyps, hems> on OTC Pepcid20 (prev refused to take the Omep40) & Miralax/ Senakot-S/ Suppos; Adm 7/15 w/ divertic bleed, self-lim, hemodynam stable, Hg nadir= 8.8; placed on Fe, Prilosec, Miralax & asked to stop ASA, NSAIDs; she refuses Fe due to constip, refuses Prilosec ("I take Pepcid"), still taking her ASA; asked to try Ferrosequels, Miralax daily, Senakot-S Qhs, Suppos prn; she had f/u Colonoscopy 9/15 Lisa King- mod divertics, melanosis, redundant colon, no polyps...     GU> she had a UTI w/ [facility] 7/15 Hosp; treated w/ Levaquin & resolved...     DJD, FM> on Aleve prn, off Flexeril, BorageOil & "tart cherry advanced"; states that air conditioning makes her neck arthritis worse; advised heating pad & anti-inflamm meds prn only; 7/17 she asked to have overriding left 2nd toe removed & we will refer to Ortho...     Osteoporosis> on Calcium, MVI; BMD was -3.5 in spine in 2007 & she has repeatedly refused f/u BMD, Bisphos med  rx, or alternative med rx or endocrine consultation; only agrees to take Calcium & Vits despite the potential severe consequences!!!    Anxiety, Insomnia> on Tranxene7.5-1/2Tid prn, Tofranil25-2Qhs "for my FM"; offered Rx for Trazadone50 for sleep=> pt stopped due to "reaction"...    Anemia> Lower GIB 7/15 believed due to divertics; Hg nadir= 8.8, she is INTOL to Fe w/ constip, rec to take [facility] + Miralax/ Senakot-S/ etc; Hg improved to 12.5 (2015 & 2016)... EXAM shows Afeb, VSS, O2sat=98% on RA;  Heent- neg, mallampati1;  Chest- clear w/o w/r/r;  Heart- RR w/o m/r/g;  Abd- soft, neg;  Ext- neg w/o c/c/e, she has overriding left 2nd toe, Neuro- intact...  IMP/PLAN>>  Lisa King is pretty set in her ways and refuses to consider inhalers for her COPD, any change in med regimen;  She wants what she wants and requests to have left 2nd toe removed by Ortho 7 we will refer to DrHewitt for eval;  Given PREVNAR-13 pneumonia vaccine today.          Problem List:   HEARING LOSS (ICD-389.9) - hx bilat stapes surg for hearing problems by DrKraus yrs ago... states she can't have MRI's due to the stapes implants... now sees DrBates w/ hearing aides bilat... ~  3/14:  She had f/u DrBates> URI & cerumen impactions- removed + conservative therapy...  ~  7/15:  She has hearing aides from ENT...  COPD (ICD-496) - states her breathing is OK and she denies SOB, and declines use of inhalers, ex= walking... She denies cough, sputum, hemoptysis, CP, palpit SOB, edema... she has declined to do PFT's in the past... ~  CXR 7/11 showed COPD, atherosclerotic calcif in Ao, osteopenic bones, compression of 2 mid thor vertebral bodies, NAD.Marland Kitchen. ~  CXR 7/12 showed hyperinflation, some apical pleural scarring, sm HH, mitral annular calcif, NAD.Marland Kitchen. ~  CXR 7/13 showed heart at the upper lim of norm in size, lungs are clear/  overinflated c/w COPD, apical pleural thickening, NAD...  CIGARETTE SMOKER (ICD-305.1) - states she's quit smoking now "but I cheat now & again"... she refuses Chantix stating that a friend tried it and died!!! We discussed other smoking cessation strategies... ~  7/13:  She states "I don't buy them anymore" but smokes now & then when she can get them... ~  States she quit 02/15/12 & denies on-going resp symptoms of cough, sput, hemoptysis, CP, dyspnea, edema, etc...  HYPERCHOLESTEROLEMIA (ICD-272.0) - she has refused statin Rx in the past... tried Simvastatin 20mg /d 5/09 but states that she developed "muscle cramps, throat closing & insomnia" which resolved off this med- refuses to try other meds "I'm on Fish Oil & diet"... ~  FLP 11/07 showed Tchol 214, TG 130, HDL 51, LDL 125... refused statin, perfers diet alone... ~  FLP 5/09 showed TChol 228, TG 200, HDL 48, LDL 139... rec- start Simva20... pt DC'd it. ~  FLP 7/10 showed TChol 219, TG 171, HDL 54, LDL 127... refuses Fibrate or other lipid meds. ~  FLP 6/11 showed TChol 234, TG 166, HDL 55, LDL 133... discussed diet Rx. ~  FLP 6/12 showed TChol 196, TG 134, HDL 60, LDL 110... Improved on diet Rx! ~  FLP 7/13 showed TChol 219, TG 139, HDL 68, LDL 115... Offered low dose statin but she declines... ~  FLP 7/14 on diet alone showed TChol 191, TG 107, HDL 61, LDL 109... Improved on diet alone.  GERD (ICD-530.81)  -  on PEPCID 20mg Qd but she is adamant that  FAMOTIDINE works much better than Pepcid... followed for GI by Lisa King...  DIVERTICULOSIS OF COLON (ICD-562.10) - she denies D, C, blood, ch in bowel habits... COLONIC POLYPS (ICD-211.3) - colonoscopy 7/06 showed several 4-27mm adenomatous polyps in a long redundant colon w/ divertics & hems... f/u colonoscopy 10/09 showed divertics, several polyps= adenomatous & hyperpl w/ f/u colon planned for 48yrs... HEMORRHOIDS (ICD-455.6) ~  2014> she is due for a f/u w/ Lisa King re: f/u colonoscopy =>  she never sched this f/u visit... ~  7/15: Hosp by [facility] w/ Lower GIB believed to be a diverticv bleed, self limited, hemodynamically stable, Hg~9 range, & set up for outpt f/u by [employer]... ~  Colonoscopy 9/15 by Lisa King showed mod sigmoid divertics, no polyps, extensive melanosis, redundant colon...  ~  10/15: on Pepcid20, not taking Omep; on Miralax prn & advised regular dosing, plus Senakot-S Qhs, and suppos prn; her CC is arthritis and still takes NSAIDs regularly- advised prn dosing & regular Omeprazole40 30 min before the 1st meal each day...  DEGENERATIVE JOINT DISEASE (ICD-715.90) - hx of leg pain w/ norm ABI's 2007... eval by Janett Billow for Ortho w/ "arthritis and scoliosis" treated w/ muscle relaxer and improved... she states she can't get MRI due to prev stapes surg... ~  7/11: she notes that black cherry concentrate is good "it took a freind's pain away"... ~  Arthritis is her CC> on Aleve regularly + Tylenol prn; advised intermittwent prn dosing f NSAIDs and use the Omep40 daily...  FIBROMYALGIA (ICD-729.1) - on IMIPRAMINE 25mg - 2tabs daily, + Flexeril10Tid prn... ~  7/12:  She notes that her FM pain was exac by the dog bite 5/12...  OSTEOPOROSIS (ICD-733.00) - known severe osteoporosis w/ BMD 11/07 showing TScores -3.5 in spine & -2.6 in left fem neck... pt states intol to [facility] and refused other bisphosphonates or miacalcin... in addition she has refuses Endocrine referral to discuss further... "I take calcium and vitamins" but I carefully explained how that is not sufficient and that she is likely to suffer a serious compression fracture of her sp, or a fall could easily fracture her hip- she refuses Rx or further help... I have advised her to talk to her GYN about this as well. ~  7/10:  We discussed osteoporosis options again including bisphos, reclast, miacalcin, forteo, & the new prolia- she is not interested in Rx or in Endocrine second opinion... ~  7/11:  Ditto... ~  7/12:   I reviewed all the above & offered the new ATELVIA 35mg /wk (fewer GI problems) and she agreed to try it, Rx written==> stopped on her own. ~  7/13:  We reviewed the situation w/ her bones, osteoporosis & her risks; she again refuses meds for this problem, and declines offers for subspec referral (Endocrine, etc)... ~  7/14:  She once again confirms her staunch refusal to consider f/u BMD, med rx of any kind for her osteoporosis, or 2nd opinion consultation... ~  Labs 7/15 showed Vit D = 80...  ANXIETY (ICD-300.00)- she takes Tranxene7.5 at bedtime to help her sleep...  ANEMIA>  She was [facility] 7/15 w/ Lower GIB believed to be due to divertics, managed conservatively & referred to GI as outpt for f/u... ~  Baseline labs showed Hg ~12 range... ~  Labs 7/15 in hosp showed Hg= 9-10 range w/ nadir=8.8; placed on Fe at disch but poorly tolerated.... ~  Labs 7/15 in office f/u showed Hg= 9.8, Fe= 61 (17%sat), B12= 588... rec to try Ferrosequels... ~  Labs 10/15 showed Hg= 12.5, Fe= 97 (25%sat), Ferritin= 19... 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 she takes ASA 81mg /d... had Tetanus shot 1997 & Pneumovax 2000... she refuses Flu shots! ~  Supplement meds include> Calc w/ D, MVI, Tums, FishOil, BorageOil, FlaxSeedOil, etc... ~  10/15: pt asked to f/u in 19mo w/ Fasting labs, but she refused- wants routine ROV in 63yr...   Past Surgical History  Procedure Laterality Date  . Tonsillectomy and adenoidectomy      as a child  . Stapes surgery      Dr Dorma Russell  . Cataract extraction, bilateral  2009  . Colonoscopy  2009, 2006     Outpatient Encounter Prescriptions as of 09/01/2015  Medication Sig  . aspirin 81 MG tablet Take 81 mg by mouth daily.  . Calcium Carbonate-Vitamin D (CALTRATE 600+D) 600-400 MG-UNIT per tablet Take 1 tablet by mouth daily.    . cholecalciferol (VITAMIN D) 1000 UNITS tablet Take 1,000 Units by mouth daily.  . clorazepate (TRANXENE) 7.5  MG tablet TAKE ONE TABLET BY MOUTH TWICE DAILY AS NEEDED FOR ANXIETY  . cyclobenzaprine (FLEXERIL) 10 MG tablet TAKE ONE-HALF TO ONE TABLET BY MOUTH THREE TIMES DAILY AS NEEDED FOR MUSCLE SPASM  . docusate sodium (COLACE) 100 MG capsule Take 100 mg by mouth daily.   . famotidine (PEPCID) 20 MG tablet TAKE ONE TABLET BY MOUTH ONCE DAILY  . fish oil-omega-3 fatty acids 1000 MG capsule Take 2 g by mouth daily.    Marland Kitchen imipramine (TOFRANIL) 25 MG tablet TAKE TWO TABLETS BY MOUTH ONCE DAILY  . meclizine (ANTIVERT) 25 MG tablet Take 1 tablet (25 mg total) by mouth 4 (four) times daily as needed for dizziness.  . metroNIDAZOLE (METROCREAM) 0.75 % cream Apply topically 2 (two) times daily.  . mupirocin ointment (BACTROBAN) 2 % Apply topically 2 (two) times daily.  . naproxen sodium (ANAPROX) 220 MG tablet Take 220 mg by mouth at bedtime.  . polyethylene glycol (MIRALAX / GLYCOLAX) packet Take 17 g by mouth daily.    . sennosides-docusate sodium (SENOKOT-S) 8.6-50 MG tablet Take 1 tablet by mouth daily.  . cyclobenzaprine (FLEXERIL) 10 MG tablet Take 1 tablet (10 mg total) by mouth every 8 (eight) hours as needed for muscle spasms. (Patient not taking: Reported on 09/01/2015)  . traZODone (DESYREL) 50 MG tablet Take 1 tablet (50 mg total) by mouth at bedtime as needed for sleep. (Patient not taking: Reported on 09/01/2015)   No facility-administered encounter medications on file as of 09/01/2015.    Allergies  Allergen Reactions  . Penicillins Anaphylaxis  . Bisphosphonates Other (See Comments)    [name]  . Influenza Vaccines     Pt reported  . Simvastatin     Unknown   . Trazodone And Nefazodone     Felt her throat was closing up/kept her awake  . Sulfonamide Derivatives Rash and Other (See Comments)    blisters    Immunization History  Administered Date(s) Administered  . Pneumococcal Conjugate-13 09/01/2015  . Pneumococcal Polysaccharide-23 07/07/1998  . Td 08/21/2009    Current  Medications, Allergies, Past Medical History, Past Surgical History, Family History, and Social History were reviewed in [employer] record.   Review of Systems       See HPI - all other systems neg except as noted...       The patient complains of dyspnea on exertion, severe indigestion/heartburn, muscle weakness,  and difficulty walking.  The patient denies anorexia, fever, weight loss, weight gain, vision loss, decreased hearing, hoarseness, chest pain, syncope, peripheral edema, prolonged cough, headaches, hemoptysis, abdominal pain, melena, hematochezia, hematuria, incontinence, suspicious skin lesions, transient blindness, depression, unusual weight change, abnormal bleeding, enlarged lymph nodes, and angioedema.     Objective:   Physical Exam    WD, WN, 82 y/o WF in NAD... GENERAL:  Alert & oriented; pleasant & cooperative... HEENT:  [place]/AT, EOM-wnl, PERRLA, EACs-clear, TMs-wnl, NOSE-clear, THROAT-clear & wnl. NECK:  Supple w/ fairROM; no JVD; normal carotid impulses w/o bruits; no thyromegaly or nodules palpated; no lymphadenopathy. CHEST:  decr BS bilat, clear to P & A; without wheezes/ rales/ or rhonchi heard... HEART:  Regular Rhythm; without murmurs/ rubs/ or gallops detected... ABDOMEN:  Soft & nontender; normal bowel sounds; no organomegaly or masses palpated... EXT: without deformities, mod arthritic changes; no varicose veins/ +venous insuffic/ tr edema. NEURO:  CN's intact; no focal neuro deficits... DERM:  No lesions noted; prev nasal skin surg; no rash etc...  RADIOLOGY DATA:  Reviewed in the EPIC EMR & discussed w/ the patient...  LABORATORY DATA:  Reviewed in the EPIC EMR & discussed w/ the patient...   Assessment & Plan:    COPD>  she's quit smoking & denies SOB etc but she is clearly too sedentary & asked to incr activity/ exercise...  CHOL>  On diet alone & last FLP was improved & close to goals...   GI>  GERD/ Divertics/ Polyps>   Followed by Lisa King for GI; stable on Famotidine which she swears works better that [employer]; due for 33yr f/u colon in 2014... GIB, likely divertic bleed 7/15> self limited, feeling better, c/o chr constip & rec to take Miralax & Senakot-S (but she wants Linzess- like her brother); referred to GI for f/u eval & to sched colon=> done 9/15 [facility]-  divertics, melanosis, redundant colon...  DJD/ FM>  Stable overall; uses OTC anti-inflamm as needed + Imipramine Qhs... 7/17> she want overriding left 2nd toe removed=> we will refer to Ortho for eval...  Osteoporosis/ Compression fx>  She has 2 compression fx that are old, she is very high risk, we had another long conversation about her osteoporosis & the risk it imposes, we reviewed the avail therapies (again) but she declines all avail therapies & refuses second opinion consult etc...   Anxiety>  She uses the Chlorazepate as needed...  Anemia> due to GIB; f/u Hg=9.8, Fe=61, and rec to take FERROSEQUELS=>  Follow up labs 10/15 w/ Hg=12.5, Fe=97...   Patient's Medications  New Prescriptions   No medications on file  Previous Medications   ASPIRIN 81 MG TABLET    Take 81 mg by mouth daily.   CALCIUM CARBONATE-VITAMIN D (CALTRATE 600+D) 600-400 MG-UNIT PER TABLET    Take 1 tablet by mouth daily.     CHOLECALCIFEROL (VITAMIN D) 1000 UNITS TABLET    Take 1,000 Units by mouth daily.   CLORAZEPATE (TRANXENE) 7.5 MG TABLET    TAKE ONE TABLET BY MOUTH TWICE DAILY AS NEEDED FOR ANXIETY   CYCLOBENZAPRINE (FLEXERIL) 10 MG TABLET    TAKE ONE-HALF TO ONE TABLET BY MOUTH THREE TIMES DAILY AS NEEDED FOR MUSCLE SPASM   CYCLOBENZAPRINE (FLEXERIL) 10 MG TABLET    Take 1 tablet (10 mg total) by mouth every 8 (eight) hours as needed for muscle spasms.   DOCUSATE SODIUM (COLACE) 100 MG CAPSULE    Take 100 mg by mouth daily.    FAMOTIDINE (PEPCID) 20  MG TABLET    TAKE ONE TABLET BY MOUTH ONCE DAILY   FISH OIL-OMEGA-3 FATTY ACIDS 1000 MG CAPSULE    Take 2 g by mouth  daily.     IMIPRAMINE (TOFRANIL) 25 MG TABLET    TAKE TWO TABLETS BY MOUTH ONCE DAILY   MECLIZINE (ANTIVERT) 25 MG TABLET    Take 1 tablet (25 mg total) by mouth 4 (four) times daily as needed for dizziness.   METRONIDAZOLE (METROCREAM) 0.75 % CREAM    Apply topically 2 (two) times daily.   MUPIROCIN OINTMENT (BACTROBAN) 2 %    Apply topically 2 (two) times daily.   NAPROXEN SODIUM (ANAPROX) 220 MG TABLET    Take 220 mg by mouth at bedtime.   POLYETHYLENE GLYCOL (MIRALAX / GLYCOLAX) PACKET    Take 17 g by mouth daily.     SENNOSIDES-DOCUSATE SODIUM (SENOKOT-S) 8.6-50 MG TABLET    Take 1 tablet by mouth daily.   TRAZODONE (DESYREL) 50 MG TABLET    Take 1 tablet (50 mg total) by mouth at bedtime as needed for sleep.  Modified Medications   No medications on file  Discontinued Medications   No medications on file

## 2015-10-08 DIAGNOSIS — M205X2 Other deformities of toe(s) (acquired), left foot: Secondary | ICD-10-CM | POA: Diagnosis not present

## 2015-10-09 ENCOUNTER — Other Ambulatory Visit: Payer: Self-pay | Admitting: Pulmonary Disease

## 2015-10-14 DIAGNOSIS — L309 Dermatitis, unspecified: Secondary | ICD-10-CM | POA: Diagnosis not present

## 2015-10-14 DIAGNOSIS — L718 Other rosacea: Secondary | ICD-10-CM | POA: Diagnosis not present

## 2015-10-17 ENCOUNTER — Other Ambulatory Visit: Payer: Self-pay | Admitting: Pulmonary Disease

## 2015-11-19 DIAGNOSIS — M205X2 Other deformities of toe(s) (acquired), left foot: Secondary | ICD-10-CM | POA: Diagnosis not present

## 2015-11-19 DIAGNOSIS — M2042 Other hammer toe(s) (acquired), left foot: Secondary | ICD-10-CM | POA: Diagnosis not present

## 2015-11-20 ENCOUNTER — Other Ambulatory Visit: Payer: Self-pay | Admitting: Pulmonary Disease

## 2015-11-28 NOTE — Telephone Encounter (Signed)
Received electronic refill on  Clorazepate 7.5mg   TAKE ONE TABLET BY MOUTH TWICE DAILY AS NEEDED FOR ANXIETY #30 with 2 refills Last refilled on 03/28/15  SN please advise on refill

## 2015-12-02 DIAGNOSIS — H10401 Unspecified chronic conjunctivitis, right eye: Secondary | ICD-10-CM | POA: Diagnosis not present

## 2015-12-04 DIAGNOSIS — L02611 Cutaneous abscess of right foot: Secondary | ICD-10-CM | POA: Diagnosis not present

## 2015-12-04 DIAGNOSIS — L602 Onychogryphosis: Secondary | ICD-10-CM | POA: Diagnosis not present

## 2015-12-04 DIAGNOSIS — L03031 Cellulitis of right toe: Secondary | ICD-10-CM | POA: Diagnosis not present

## 2015-12-04 DIAGNOSIS — M79674 Pain in right toe(s): Secondary | ICD-10-CM | POA: Diagnosis not present

## 2015-12-07 ENCOUNTER — Encounter (HOSPITAL_COMMUNITY): Payer: Self-pay | Admitting: Emergency Medicine

## 2015-12-07 ENCOUNTER — Emergency Department (HOSPITAL_COMMUNITY)
Admission: EM | Admit: 2015-12-07 | Discharge: 2015-12-07 | Disposition: A | Discharge status: Home / Self Care | Payer: Medicare Other | Attending: Emergency Medicine | Admitting: Emergency Medicine

## 2015-12-07 DIAGNOSIS — Z79899 Other long term (current) drug therapy: Secondary | ICD-10-CM | POA: Insufficient documentation

## 2015-12-07 DIAGNOSIS — Z7982 Long term (current) use of aspirin: Secondary | ICD-10-CM | POA: Diagnosis not present

## 2015-12-07 DIAGNOSIS — H5711 Ocular pain, right eye: Secondary | ICD-10-CM | POA: Diagnosis not present

## 2015-12-07 DIAGNOSIS — H578 Other specified disorders of eye and adnexa: Secondary | ICD-10-CM | POA: Diagnosis present

## 2015-12-07 DIAGNOSIS — J449 Chronic obstructive pulmonary disease, unspecified: Secondary | ICD-10-CM | POA: Diagnosis not present

## 2015-12-07 DIAGNOSIS — Z87891 Personal history of nicotine dependence: Secondary | ICD-10-CM | POA: Diagnosis not present

## 2015-12-07 LAB — I-STAT CHEM 8, ED
BUN: 16 mg/dL (ref 6–20)
CALCIUM ION: 1.11 mmol/L — AB (ref 1.15–1.40)
CREATININE: 1 mg/dL (ref 0.44–1.00)
Chloride: 105 mmol/L (ref 101–111)
GLUCOSE: 87 mg/dL (ref 65–99)
HCT: 33 % — ABNORMAL LOW (ref 36.0–46.0)
HEMOGLOBIN: 11.2 g/dL — AB (ref 12.0–15.0)
Potassium: 3.9 mmol/L (ref 3.5–5.1)
Sodium: 141 mmol/L (ref 135–145)
TCO2: 26 mmol/L (ref 0–100)

## 2015-12-07 LAB — CBC WITH DIFFERENTIAL/PLATELET
BASOS PCT: 1 %
Basophils Absolute: 0 10*3/uL (ref 0.0–0.1)
EOS ABS: 0.1 10*3/uL (ref 0.0–0.7)
Eosinophils Relative: 2 %
HCT: 34.6 % — ABNORMAL LOW (ref 36.0–46.0)
HEMOGLOBIN: 11.6 g/dL — AB (ref 12.0–15.0)
Lymphocytes Relative: 34 %
Lymphs Abs: 1.9 10*3/uL (ref 0.7–4.0)
MCH: 30.9 pg (ref 26.0–34.0)
MCHC: 33.5 g/dL (ref 30.0–36.0)
MCV: 92 fL (ref 78.0–100.0)
Monocytes Absolute: 0.3 10*3/uL (ref 0.1–1.0)
Monocytes Relative: 5 %
NEUTROS ABS: 3.2 10*3/uL (ref 1.7–7.7)
NEUTROS PCT: 58 %
Platelets: 242 10*3/uL (ref 150–400)
RBC: 3.76 MIL/uL — AB (ref 3.87–5.11)
RDW: 13.9 % (ref 11.5–15.5)
WBC: 5.5 10*3/uL (ref 4.0–10.5)

## 2015-12-07 MED ORDER — TETRACAINE HCL 0.5 % OP SOLN
2.0000 [drp] | Freq: Once | OPHTHALMIC | Status: AC
  Administered 2015-12-07: 2 [drp] via OPHTHALMIC
  Filled 2015-12-07: qty 4

## 2015-12-07 MED ORDER — FLUORESCEIN SODIUM 1 MG OP STRP
1.0000 | ORAL_STRIP | Freq: Once | OPHTHALMIC | Status: AC
  Administered 2015-12-07: 1 via OPHTHALMIC
  Filled 2015-12-07: qty 1

## 2015-12-07 NOTE — Discharge Instructions (Signed)
There looks to be a little vein or artery lay off on the side of the eye that is a little swollen. It should not cause you any problem. Follow-up with ophthalmology as needed.

## 2015-12-07 NOTE — ED Notes (Signed)
Pt walked to and from bathroom. No urin.

## 2015-12-07 NOTE — ED Notes (Signed)
Pt is unable to urinate this time.

## 2015-12-07 NOTE — ED Triage Notes (Signed)
Pt states that last Thursday she began noticing that her R eye felt different and she thinks she saw a worm in it and blood came out. Went to her PCP on Friday and he did not see anything. Alert and oriented.

## 2015-12-07 NOTE — ED Provider Notes (Signed)
[facility] DEPT Provider Note   CSN: [id number] Arrival date & time: 12/07/15  1756     History   Chief Complaint Chief Complaint  Patient presents with  . Eye Problem    HPI Lisa King is a 82 y.o. female.  HPI Patient presents with complaints of a at her right eye. States that early Thursday morning she saw a black worm when she looked in the mirror. States it then went away. States she is still felt that in there since. She states that she saw Dr. Claudean Kinds, the ophthalmologist on Friday and he did not see anything. States he did not really look around and below her eyes though. She states that he told her that she was crazy.  Patient denies fevers. States she has not had a bowel movement in 2 weeks and also has not urinated in 2 weeks.  Past Medical History:  Diagnosis Date  . Anxiety   . Cigarette smoker   . Colon polyps 2009   TWO TUBULAR ADENOMAS AND HYPERPLASTIC POLYPS  . COPD (chronic obstructive pulmonary disease) ([place])   . Diverticulosis of colon   . DJD (degenerative joint disease)   . Dog bite of limb 07/14/2010   Dog bite 07/10/10 - dogs shots utd Last td was 08/2009  Localized tx only.    . Fibromyalgia   . GERD (gastroesophageal reflux disease)   . Hearing loss   . Hemorrhoids   . Hypercholesterolemia   . Osteoporosis     Patient Active Problem List   Diagnosis Date Noted  . Personal history of colonic polyps 09/14/2013  . Constipation 09/14/2013  . Acute blood loss anemia 08/23/2013  . HEARING LOSS 09/05/2008  . Anxiety state 07/02/2007  . HYPERCHOLESTEROLEMIA 06/19/2007  . COPD (chronic obstructive pulmonary disease) with chronic bronchitis ([place]) 12/19/2006  . GERD 12/19/2006  . Osteoarthritis 12/19/2006  . Myalgia and myositis 12/19/2006  . Osteoporosis 12/19/2006    Past Surgical History:  Procedure Laterality Date  . CATARACT EXTRACTION, BILATERAL  2009  . COLONOSCOPY  2009, 2006   . stapes surgery     Dr Thornell Mule  . TONSILLECTOMY  AND ADENOIDECTOMY     as a child    OB History    No data available       Home Medications    Prior to Admission medications   Medication Sig Start Date End Date Taking? Authorizing Provider  aspirin 81 MG tablet Take 81 mg by mouth daily.   Yes Historical Provider, MD  Calcium Carbonate-Vitamin D (CALTRATE 600+D) 600-400 MG-UNIT per tablet Take 1 tablet by mouth daily.     Yes Historical Provider, MD  cholecalciferol (VITAMIN D) 1000 UNITS tablet Take 1,000 Units by mouth daily.   Yes Historical Provider, MD  docusate sodium (COLACE) 100 MG capsule Take 100 mg by mouth daily.    Yes Historical Provider, MD  fish oil-omega-3 fatty acids 1000 MG capsule Take 2 g by mouth daily.     Yes Historical Provider, MD  fluorometholone (FML) 0.1 % ophthalmic suspension Place 1 drop into the right eye 2 (two) times daily. 5 Day Course. 12/03/15  Yes Historical Provider, MD  naproxen sodium (ANAPROX) 220 MG tablet Take 220 mg by mouth at bedtime.   Yes Historical Provider, MD  clorazepate (TRANXENE) 7.5 MG tablet TAKE ONE TABLET BY MOUTH TWICE DAILY AS NEEDED 12/02/15   Noralee Space, MD  cyclobenzaprine (FLEXERIL) 10 MG tablet TAKE ONE-HALF TO ONE TABLET BY MOUTH THREE TIMES  DAILY AS NEEDED FOR MUSCLE SPASM Patient not taking: Reported on 12/07/2015 07/01/15   Noralee Space, MD  cyclobenzaprine (FLEXERIL) 10 MG tablet Take 1 tablet (10 mg total) by mouth every 8 (eight) hours as needed for muscle spasms. Patient not taking: Reported on 12/07/2015 06/25/15   Noralee Space, MD  famotidine (PEPCID) 20 MG tablet TAKE ONE TABLET BY MOUTH ONCE DAILY Patient not taking: Reported on 12/07/2015 10/09/15   Noralee Space, MD  imipramine (TOFRANIL) 25 MG tablet TAKE TWO TABLETS BY MOUTH ONCE DAILY Patient not taking: Reported on 12/07/2015 10/21/15   Noralee Space, MD  meclizine (ANTIVERT) 25 MG tablet Take 1 tablet (25 mg total) by mouth 4 (four) times daily as needed for dizziness. Patient not taking: Reported  on 12/07/2015 12/10/13   Noralee Space, MD  traZODone (DESYREL) 50 MG tablet Take 1 tablet (50 mg total) by mouth at bedtime as needed for sleep. Patient not taking: Reported on 12/07/2015 03/04/15   Noralee Space, MD    Family History Family History  Problem Relation Age of Onset  . Heart disease Father   . COPD Father   . Hypertension Mother   . Arthritis Mother     Social History Social History  Substance Use Topics  . Smoking status: Former Smoker    Types: Cigarettes    Quit date: 02/15/2010  . Smokeless tobacco: Never Used  . Alcohol use Yes     Comment: occasional glass of wine     Allergies   Penicillins; Bisphosphonates; Influenza vaccines; Simvastatin; Trazodone and nefazodone; and Sulfonamide derivatives   Review of Systems Review of Systems  Constitutional: Negative for appetite change.  HENT: Negative for congestion.   Eyes: Positive for pain, redness and itching. Negative for photophobia and discharge.  Respiratory: Negative for chest tightness.   Cardiovascular: Negative for chest pain.  Gastrointestinal: Positive for constipation.  Endocrine: Negative for polyuria.  Genitourinary: Positive for enuresis.  Musculoskeletal: Negative for back pain.  Skin: Positive for rash.       Patient has small rash on her upper eyelid area. Looks like excoriations but patient states there've been mites coming out of it.  Neurological: Negative for headaches.  Hematological: Negative for adenopathy.     Physical Exam Updated Vital Signs BP (!) 187/107   Pulse 81   Temp 97.9 F (36.6 C) (Oral)   Resp 18   SpO2 99%   Physical Exam  Constitutional: She appears well-developed.  HENT:  Head: Atraumatic.  Eyes: EOM are normal.  Mild conjunctival irritation on right side. Far laterally on the right eye and tucked under the lid in the conjunctiva there is a somewhat prominent and curved vein that could be mistaken for a warm.  Cardiovascular: Normal rate.     Pulmonary/Chest: Effort normal.  Abdominal: Soft. There is no tenderness.  Neurological: She is alert.  Skin: Skin is warm. Capillary refill takes less than 2 seconds.  Psychiatric:  Mildly strange affect.     ED Treatments / Results  Labs (all labs ordered are listed, but only abnormal results are displayed) Labs Reviewed  CBC WITH DIFFERENTIAL/PLATELET - Abnormal; Notable for the following:       Result Value   RBC 3.76 (*)    Hemoglobin 11.6 (*)    HCT 34.6 (*)    All other components within normal limits  I-STAT CHEM 8, ED - Abnormal; Notable for the following:    Calcium, Ion 1.11 (*)  Hemoglobin 11.2 (*)    HCT 33.0 (*)    All other components within normal limits  URINALYSIS, ROUTINE W REFLEX MICROSCOPIC (NOT AT [facility])    EKG  EKG Interpretation None       Radiology No results found.  Procedures Procedures (including critical care time)  Medications Ordered in ED Medications  tetracaine (PONTOCAINE) 0.5 % ophthalmic solution 2 drop (2 drops Right Eye Given by Other 12/07/15 2042)  fluorescein ophthalmic strip 1 strip (1 strip Right Eye Given 12/07/15 2042)     Initial Impression / Assessment and Plan / ED Course  I have reviewed the triage vital signs and the nursing notes.  Pertinent labs & imaging results that were available during my care of the patient were reviewed by me and considered in my medical decision making (see chart for details).  Clinical Course    Patient with eye complaints. States it just started, however she also stalks about having seen her ophthalmologist previously stated that there was nothing in her eye at that time too. Does have some mild confusion. Labs reassuring. Had initially ordered urine but she was not able to reduce it after several hours. Does not have renal failure. This point I think is not worse the extra time and effort to get the urine. Patient family really did not want a week. Discharge home to follow-up with  ophthalmology as needed. There is a small vein or artery deep on the eye laterally in the conjunctiva that may be the visual representation of the worm for her.  Final Clinical Impressions(s) / ED Diagnoses   Final diagnoses:  Pain of right eye    New Prescriptions New Prescriptions   No medications on file     Davonna Belling, MD 12/07/15 2308

## 2015-12-15 DIAGNOSIS — H0520 Unspecified exophthalmos: Secondary | ICD-10-CM | POA: Diagnosis not present

## 2015-12-15 DIAGNOSIS — Z7982 Long term (current) use of aspirin: Secondary | ICD-10-CM | POA: Diagnosis not present

## 2015-12-15 DIAGNOSIS — R930 Abnormal findings on diagnostic imaging of skull and head, not elsewhere classified: Secondary | ICD-10-CM | POA: Diagnosis not present

## 2015-12-15 DIAGNOSIS — Z87891 Personal history of nicotine dependence: Secondary | ICD-10-CM | POA: Diagnosis not present

## 2015-12-19 DIAGNOSIS — G939 Disorder of brain, unspecified: Secondary | ICD-10-CM | POA: Diagnosis not present

## 2015-12-19 DIAGNOSIS — I770 Arteriovenous fistula, acquired: Secondary | ICD-10-CM | POA: Diagnosis not present

## 2015-12-19 DIAGNOSIS — H0520 Unspecified exophthalmos: Secondary | ICD-10-CM | POA: Diagnosis not present

## 2016-01-06 DIAGNOSIS — I770 Arteriovenous fistula, acquired: Secondary | ICD-10-CM | POA: Diagnosis not present

## 2016-01-13 DIAGNOSIS — I671 Cerebral aneurysm, nonruptured: Secondary | ICD-10-CM | POA: Diagnosis not present

## 2016-01-13 DIAGNOSIS — I770 Arteriovenous fistula, acquired: Secondary | ICD-10-CM | POA: Insufficient documentation

## 2016-01-13 DIAGNOSIS — I871 Compression of vein: Secondary | ICD-10-CM | POA: Diagnosis not present

## 2016-01-14 ENCOUNTER — Telehealth: Payer: Self-pay | Admitting: Pulmonary Disease

## 2016-01-14 NOTE — Telephone Encounter (Signed)
OK, will forward to Leigh to f/u on

## 2016-01-15 ENCOUNTER — Encounter: Payer: Self-pay | Admitting: Pulmonary Disease

## 2016-01-15 NOTE — Telephone Encounter (Signed)
Form has been placed on SN desk to be signed.

## 2016-01-15 NOTE — Telephone Encounter (Signed)
Form has been completed and the pts daughter is aware.  This has been faxed and a copy has been mailed to the pts daughter. Nothing further is needed.

## 2016-02-05 DIAGNOSIS — Z888 Allergy status to other drugs, medicaments and biological substances status: Secondary | ICD-10-CM | POA: Diagnosis not present

## 2016-02-05 DIAGNOSIS — Z7982 Long term (current) use of aspirin: Secondary | ICD-10-CM | POA: Diagnosis not present

## 2016-02-05 DIAGNOSIS — H5461 Unqualified visual loss, right eye, normal vision left eye: Secondary | ICD-10-CM | POA: Diagnosis present

## 2016-02-05 DIAGNOSIS — Z87891 Personal history of nicotine dependence: Secondary | ICD-10-CM | POA: Diagnosis not present

## 2016-02-05 DIAGNOSIS — I10 Essential (primary) hypertension: Secondary | ICD-10-CM | POA: Diagnosis not present

## 2016-02-05 DIAGNOSIS — Z887 Allergy status to serum and vaccine status: Secondary | ICD-10-CM | POA: Diagnosis not present

## 2016-02-05 DIAGNOSIS — Z880 Allergy status to penicillin: Secondary | ICD-10-CM | POA: Diagnosis not present

## 2016-02-05 DIAGNOSIS — L7632 Postprocedural hematoma of skin and subcutaneous tissue following other procedure: Secondary | ICD-10-CM | POA: Diagnosis not present

## 2016-02-05 DIAGNOSIS — H0520 Unspecified exophthalmos: Secondary | ICD-10-CM | POA: Diagnosis present

## 2016-02-05 DIAGNOSIS — Z882 Allergy status to sulfonamides status: Secondary | ICD-10-CM | POA: Diagnosis not present

## 2016-02-05 DIAGNOSIS — I959 Hypotension, unspecified: Secondary | ICD-10-CM | POA: Diagnosis not present

## 2016-02-05 DIAGNOSIS — I770 Arteriovenous fistula, acquired: Secondary | ICD-10-CM | POA: Diagnosis not present

## 2016-02-05 DIAGNOSIS — I671 Cerebral aneurysm, nonruptured: Secondary | ICD-10-CM | POA: Diagnosis present

## 2016-02-13 ENCOUNTER — Encounter (HOSPITAL_COMMUNITY): Payer: Self-pay | Admitting: *Deleted

## 2016-02-13 ENCOUNTER — Emergency Department (HOSPITAL_COMMUNITY): Payer: Medicare Other

## 2016-02-13 ENCOUNTER — Emergency Department (HOSPITAL_COMMUNITY)
Admission: EM | Admit: 2016-02-13 | Discharge: 2016-02-13 | Disposition: A | Discharge status: Home / Self Care | Payer: Medicare Other | Attending: Emergency Medicine | Admitting: Emergency Medicine

## 2016-02-13 DIAGNOSIS — Y999 Unspecified external cause status: Secondary | ICD-10-CM | POA: Diagnosis not present

## 2016-02-13 DIAGNOSIS — Z79899 Other long term (current) drug therapy: Secondary | ICD-10-CM | POA: Diagnosis not present

## 2016-02-13 DIAGNOSIS — J449 Chronic obstructive pulmonary disease, unspecified: Secondary | ICD-10-CM | POA: Insufficient documentation

## 2016-02-13 DIAGNOSIS — W1839XA Other fall on same level, initial encounter: Secondary | ICD-10-CM | POA: Diagnosis not present

## 2016-02-13 DIAGNOSIS — Y929 Unspecified place or not applicable: Secondary | ICD-10-CM | POA: Diagnosis not present

## 2016-02-13 DIAGNOSIS — S20211A Contusion of right front wall of thorax, initial encounter: Secondary | ICD-10-CM | POA: Insufficient documentation

## 2016-02-13 DIAGNOSIS — R0781 Pleurodynia: Secondary | ICD-10-CM | POA: Diagnosis not present

## 2016-02-13 DIAGNOSIS — Z87891 Personal history of nicotine dependence: Secondary | ICD-10-CM | POA: Diagnosis not present

## 2016-02-13 DIAGNOSIS — S299XXA Unspecified injury of thorax, initial encounter: Secondary | ICD-10-CM | POA: Diagnosis not present

## 2016-02-13 DIAGNOSIS — Z7982 Long term (current) use of aspirin: Secondary | ICD-10-CM | POA: Diagnosis not present

## 2016-02-13 DIAGNOSIS — Y939 Activity, unspecified: Secondary | ICD-10-CM | POA: Diagnosis not present

## 2016-02-13 MED ORDER — HYDROCODONE-ACETAMINOPHEN 5-325 MG PO TABS
1.0000 | ORAL_TABLET | Freq: Once | ORAL | Status: AC
  Administered 2016-02-13: 1 via ORAL
  Filled 2016-02-13: qty 1

## 2016-02-13 MED ORDER — HYDROCODONE-ACETAMINOPHEN 5-325 MG PO TABS: 1.0000 | ORAL_TABLET | Freq: Four times a day (QID) | ORAL | 0 refills | Status: DC | PRN

## 2016-02-13 NOTE — ED Triage Notes (Signed)
Pt complains of right back/rib pain since falling 5 days ago. 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 Pt denies loss of consciousness or head injury.

## 2016-02-13 NOTE — ED Provider Notes (Signed)
[facility] DEPT Provider Note   CSN: [id number] Arrival date & time: 02/13/16  1534     History   Chief Complaint Chief Complaint  Patient presents with  . Back Pain    HPI Lisa King is a 82 y.o. female.  The history is provided by the patient.  Fall  This is a new problem. Episode onset: 5 days ago. The problem occurs constantly. The problem has not changed since onset.Associated symptoms include chest pain (right lower lateral). The symptoms are aggravated by twisting and coughing. Nothing relieves the symptoms. She has tried nothing for the symptoms.    Past Medical History:  Diagnosis Date  . Anxiety   . Cigarette smoker   . Colon polyps 2009   TWO TUBULAR ADENOMAS AND HYPERPLASTIC POLYPS  . COPD (chronic obstructive pulmonary disease) ([place])   . Diverticulosis of colon   . DJD (degenerative joint disease)   . Dog bite of limb 07/14/2010   Dog bite 07/10/10 - dogs shots utd Last td was 08/2009  Localized tx only.    . Fibromyalgia   . GERD (gastroesophageal reflux disease)   . Hearing loss   . Hemorrhoids   . Hypercholesterolemia   . Osteoporosis     Patient Active Problem List   Diagnosis Date Noted  . Personal history of colonic polyps 09/14/2013  . Constipation 09/14/2013  . Acute blood loss anemia 08/23/2013  . HEARING LOSS 09/05/2008  . Anxiety state 07/02/2007  . HYPERCHOLESTEROLEMIA 06/19/2007  . COPD (chronic obstructive pulmonary disease) with chronic bronchitis ([place]) 12/19/2006  . GERD 12/19/2006  . Osteoarthritis 12/19/2006  . Myalgia and myositis 12/19/2006  . Osteoporosis 12/19/2006    Past Surgical History:  Procedure Laterality Date  . CATARACT EXTRACTION, BILATERAL  2009  . COLONOSCOPY  2009, 2006   . stapes surgery     Dr Thornell Mule  . TONSILLECTOMY AND ADENOIDECTOMY     as a child    OB History    No data available       Home Medications    Prior to Admission medications   Medication Sig Start Date End Date Taking?  Authorizing Provider  aspirin 81 MG tablet Take 81 mg by mouth daily.    Historical Provider, MD  Calcium Carbonate-Vitamin D (CALTRATE 600+D) 600-400 MG-UNIT per tablet Take 1 tablet by mouth daily.      Historical Provider, MD  cholecalciferol (VITAMIN D) 1000 UNITS tablet Take 1,000 Units by mouth daily.    Historical Provider, MD  clorazepate (TRANXENE) 7.5 MG tablet TAKE ONE TABLET BY MOUTH TWICE DAILY AS NEEDED 12/02/15   Noralee Space, MD  cyclobenzaprine (FLEXERIL) 10 MG tablet TAKE ONE-HALF TO ONE TABLET BY MOUTH THREE TIMES DAILY AS NEEDED FOR MUSCLE SPASM Patient not taking: Reported on 12/07/2015 07/01/15   Noralee Space, MD  cyclobenzaprine (FLEXERIL) 10 MG tablet Take 1 tablet (10 mg total) by mouth every 8 (eight) hours as needed for muscle spasms. Patient not taking: Reported on 12/07/2015 06/25/15   Noralee Space, MD  docusate sodium (COLACE) 100 MG capsule Take 100 mg by mouth daily.     Historical Provider, MD  famotidine (PEPCID) 20 MG tablet TAKE ONE TABLET BY MOUTH ONCE DAILY Patient not taking: Reported on 12/07/2015 10/09/15   Noralee Space, MD  fish oil-omega-3 fatty acids 1000 MG capsule Take 2 g by mouth daily.      Historical Provider, MD  fluorometholone (FML) 0.1 % ophthalmic suspension Place 1 drop  into the right eye 2 (two) times daily. 5 Day Course. 12/03/15   Historical Provider, MD  imipramine (TOFRANIL) 25 MG tablet TAKE TWO TABLETS BY MOUTH ONCE DAILY Patient not taking: Reported on 12/07/2015 10/21/15   Noralee Space, MD  meclizine (ANTIVERT) 25 MG tablet Take 1 tablet (25 mg total) by mouth 4 (four) times daily as needed for dizziness. Patient not taking: Reported on 12/07/2015 12/10/13   Noralee Space, MD  naproxen sodium (ANAPROX) 220 MG tablet Take 220 mg by mouth at bedtime.    Historical Provider, MD  traZODone (DESYREL) 50 MG tablet Take 1 tablet (50 mg total) by mouth at bedtime as needed for sleep. Patient not taking: Reported on 12/07/2015 03/04/15    Noralee Space, MD    Family History Family History  Problem Relation Age of Onset  . Heart disease Father   . COPD Father   . Hypertension Mother   . Arthritis Mother     Social History Social History  Substance Use Topics  . Smoking status: Former Smoker    Types: Cigarettes    Quit date: 02/15/2010  . Smokeless tobacco: Never Used  . Alcohol use Yes     Comment: occasional glass of wine     Allergies   Penicillins; Bisphosphonates; Influenza vaccines; Simvastatin; Trazodone and nefazodone; and Sulfonamide derivatives   Review of Systems Review of Systems  Cardiovascular: Positive for chest pain (right lower lateral).  All other systems reviewed and are negative.    Physical Exam Updated Vital Signs BP 156/65 (BP Location: Left Arm)   Pulse 77   Temp 97.5 F (36.4 C) (Oral)   Resp 18   SpO2 100%   Physical Exam  Constitutional: She is oriented to person, place, and time. She appears well-developed and well-nourished. No distress.  HENT:  Head: Normocephalic.  Nose: Nose normal.  Eyes: Conjunctivae are normal.  Neck: Neck supple. No tracheal deviation present.  Cardiovascular: Normal rate and regular rhythm.   Pulmonary/Chest: Effort normal and breath sounds normal. No respiratory distress. She exhibits tenderness. She exhibits no crepitus, no edema, no deformity and no retraction.    Abdominal: Soft. She exhibits no distension.  Neurological: She is alert and oriented to person, place, and time.  Skin: Skin is warm and dry.  Psychiatric: She has a normal mood and affect.     ED Treatments / Results  Labs (all labs ordered are listed, but only abnormal results are displayed) Labs Reviewed - No data to display  EKG  EKG Interpretation None       Radiology Dg Ribs Unilateral W/chest Right  Result Date: 02/13/2016 CLINICAL DATA:  Acute onset of right back and rib pain, after fall. Initial encounter. EXAM: RIGHT RIBS AND CHEST - 3+ VIEW  COMPARISON:  Chest radiograph performed 08/25/2011 FINDINGS: No displaced rib fractures are seen. The lungs are well-aerated and clear. There is no evidence of focal opacification, pleural effusion or pneumothorax. The cardiomediastinal silhouette is borderline normal in size. No acute osseous abnormalities are seen. IMPRESSION: No displaced rib fracture seen. No acute cardiopulmonary process identified. Electronically Signed   By: Garald Balding M.D.   On: 02/13/2016 18:47    Procedures Procedures (including critical care time)  Medications Ordered in ED Medications  HYDROcodone-acetaminophen (NORCO/VICODIN) 5-325 MG per tablet 1 tablet (1 tablet Oral Given 02/13/16 1851)     Initial Impression / Assessment and Plan / ED Course  I have reviewed the triage vital signs and the  nursing notes.  Pertinent labs & imaging results that were available during my care of the patient were reviewed by me and considered in my medical decision making (see chart for details).  Clinical Course     82 y.o. female presents with falling backwards 5 days ago onto her right chest to the floor. No LOC or head injury, well appearing, no shortness of breath. Exquisitely tender over right lower lateral chest wall. No signs of pneumothorax or displaced rib fractures, high suspicion of chest wall contusion or nondisplaced fractures. Provided norco for pain control and discussed incentive spirometry to prevent pneumonia. Plan to follow up with PCP as needed and return precautions discussed for worsening or new concerning symptoms.   Final Clinical Impressions(s) / ED Diagnoses   Final diagnoses:  Chest wall contusion, right, initial encounter    New Prescriptions Discharge Medication List as of 02/13/2016  7:11 PM    START taking these medications   Details  HYDROcodone-acetaminophen (NORCO/VICODIN) 5-325 MG tablet Take 1 tablet by mouth every 6 (six) hours as needed for severe pain., Starting Fri 02/13/2016,  Print         Leo Grosser, MD 02/14/16 [phone]

## 2016-02-13 NOTE — ED Notes (Signed)
Pt stated "it's not a constant pain, it's when I move".

## 2016-02-19 DIAGNOSIS — Z48810 Encounter for surgical aftercare following surgery on the sense organs: Secondary | ICD-10-CM | POA: Diagnosis not present

## 2016-02-19 DIAGNOSIS — Z961 Presence of intraocular lens: Secondary | ICD-10-CM | POA: Diagnosis not present

## 2016-02-19 DIAGNOSIS — Q2730 Arteriovenous malformation, site unspecified: Secondary | ICD-10-CM | POA: Diagnosis not present

## 2016-03-01 ENCOUNTER — Telehealth: Payer: Self-pay | Admitting: Pulmonary Disease

## 2016-03-01 MED ORDER — CIPROFLOXACIN HCL 250 MG PO TABS: 250.0000 mg | ORAL_TABLET | Freq: Two times a day (BID) | ORAL | 0 refills | Status: DC

## 2016-03-01 NOTE — Telephone Encounter (Signed)
[place] for pt's daughter.  Will send in Rx after speaking with Robin.

## 2016-03-01 NOTE — Telephone Encounter (Signed)
Spoke with pt's daughter (pt gave verbal) who states pt is having some burning with urination. Pt's daughter is requesting a medication to be sent in. Pt is scheduled for f/u with SN on 03-03-16.  SN please advise. Thanks.

## 2016-03-01 NOTE — Telephone Encounter (Signed)
Spoke with pt daughter, aware of recs.  rx sent to preferred pharmacy.  Nothing further needed.

## 2016-03-01 NOTE — Telephone Encounter (Signed)
Per SN---  cipro 250 mg  #14  1 po bid

## 2016-03-01 NOTE — Telephone Encounter (Signed)
Robin returned call, CB is [phone] or [phone].

## 2016-03-03 ENCOUNTER — Ambulatory Visit: Payer: Medicare Other | Admitting: Pulmonary Disease

## 2016-03-08 ENCOUNTER — Telehealth: Payer: Self-pay | Admitting: Pulmonary Disease

## 2016-03-08 NOTE — Telephone Encounter (Signed)
Form filled out to best of my ability and placed in SN inbasket above [employer].  Will forward to Leigh to follow up on.

## 2016-03-10 NOTE — Telephone Encounter (Signed)
Tried to locate this form and I couldn't. Leigh do you know if this has been completed?

## 2016-03-10 NOTE — Telephone Encounter (Signed)
Form is on SN cart to be signed by him and I will call once this is completed.

## 2016-03-11 NOTE — Telephone Encounter (Signed)
Form has been completed by SN and I have faxed a copy of this form to [facility] per Robin's request.  I have placed a copy of this in SN scan folder and waiting for robin to call back to get the correct address to mail this form to.

## 2016-03-11 NOTE — Telephone Encounter (Signed)
Pt daughter calling stating that the form left for him to sign, [facility] needs them today, they won't let her move in w/o them please advise she can be reached @ [phone].Hillery Hunter

## 2016-03-11 NOTE — Telephone Encounter (Signed)
Lisa King stated she need the form today in order to admit the patient. [id number] [place]

## 2016-03-12 NOTE — Telephone Encounter (Signed)
Spoke with Robin. She would like this mailed to [facility]. [place], [place] [address].

## 2016-03-12 NOTE — Telephone Encounter (Signed)
This has been done and placed in the mail. Nothing further is needed.

## 2016-03-17 ENCOUNTER — Telehealth: Payer: Self-pay | Admitting: Pulmonary Disease

## 2016-03-17 NOTE — Telephone Encounter (Signed)
lmtcb x1 for Robin. 

## 2016-03-18 ENCOUNTER — Telehealth: Payer: Self-pay | Admitting: Pulmonary Disease

## 2016-03-18 NOTE — Telephone Encounter (Signed)
This matter has already been discussed with the pt's daughter >> see telephone encounter dated 03/17/2016. lmtcb x1 for Helene Kelp at [facility].

## 2016-03-18 NOTE — Telephone Encounter (Signed)
Spoke with pt's daughter, Shirlean Mylar. States that the pt is currently living at [facility]. Reports that they are giving the pt her muscle relaxer three times a day as it's prescribed. The pt is very lethargic and "like a zombie." Shirlean Mylar would like to have certain medications stopped. Pt has an upcoming appointment with SN on 03/23/16. Shirlean Mylar thinks that the pt would rather wait until she saw SN before any meds are stopped. Shirlean Mylar has asked the facility to hold all medications until her appointment with SN. I advised Shirlean Mylar if she needed [place] before the pt's appointment to contact us. She agreed and verbalized understanding. Nothing further was needed.

## 2016-03-19 ENCOUNTER — Telehealth: Payer: Self-pay | Admitting: Pulmonary Disease

## 2016-03-19 NOTE — Telephone Encounter (Signed)
Need this taken care of ASAP.Lisa King

## 2016-03-19 NOTE — Telephone Encounter (Signed)
Chelle from the [facility] called back - she can be reached at [phone] ext [phone] -pr

## 2016-03-19 NOTE — Telephone Encounter (Signed)
Called and spoke with [facility] and they stated that Lisa King is off today.  She stated that they did receive all of the faxes from yesterday and she stated that she did fax something back from last night due to not being able to read it.  I will fix this and fax it back to her.

## 2016-03-19 NOTE — Telephone Encounter (Signed)
Spoke with Chelle from [facility]. She needed clarification on the fax that [facility] sent over on pt's Clorazepate prescription. States that the fax that came through was not legible. Per Leigh >> pt is to take Clorazepate 0.5 tab BID. Nothing further was needed.

## 2016-03-19 NOTE — Telephone Encounter (Signed)
lmtcb x1 for [facility].

## 2016-03-23 ENCOUNTER — Encounter: Payer: Self-pay | Admitting: Pulmonary Disease

## 2016-03-23 ENCOUNTER — Telehealth: Payer: Self-pay | Admitting: Pulmonary Disease

## 2016-03-23 ENCOUNTER — Ambulatory Visit: Payer: Medicare Other | Admitting: Pulmonary Disease

## 2016-03-23 VITALS — BP 130/70 | HR 78 | Temp 97.7°F | Ht 62.0 in | Wt 128.0 lb

## 2016-03-23 DIAGNOSIS — I671 Cerebral aneurysm, nonruptured: Secondary | ICD-10-CM

## 2016-03-23 DIAGNOSIS — F411 Generalized anxiety disorder: Secondary | ICD-10-CM

## 2016-03-23 DIAGNOSIS — M810 Age-related osteoporosis without current pathological fracture: Secondary | ICD-10-CM

## 2016-03-23 DIAGNOSIS — J449 Chronic obstructive pulmonary disease, unspecified: Secondary | ICD-10-CM

## 2016-03-23 DIAGNOSIS — K219 Gastro-esophageal reflux disease without esophagitis: Secondary | ICD-10-CM

## 2016-03-23 DIAGNOSIS — M797 Fibromyalgia: Secondary | ICD-10-CM

## 2016-03-23 DIAGNOSIS — M159 Polyosteoarthritis, unspecified: Secondary | ICD-10-CM

## 2016-03-23 DIAGNOSIS — I770 Arteriovenous fistula, acquired: Secondary | ICD-10-CM

## 2016-03-23 DIAGNOSIS — M150 Primary generalized (osteo)arthritis: Secondary | ICD-10-CM

## 2016-03-23 DIAGNOSIS — K5901 Slow transit constipation: Secondary | ICD-10-CM

## 2016-03-23 HISTORY — DX: Cerebral aneurysm, nonruptured: I67.1

## 2016-03-23 NOTE — Telephone Encounter (Signed)
lmtcb X1 for Lisa King at [facility]

## 2016-03-23 NOTE — Telephone Encounter (Signed)
Spoke with Tywan on [facility]. She states that she received pt's AVS from today's visit. Lenell Antu is concerned that all of the pt's medications have been discontinued. [facility] will need d/c orders for all medications that are being stopped. Lenell Antu is concerned that the pt will not have any medications on hand for pain if the pt needs it.  SN - please advise. Thanks.

## 2016-03-23 NOTE — Progress Notes (Signed)
 Subjective:    Patient ID: Lisa King, female    DOB: 19-Jan-1932, 83 y.o.   MRN: [id number]  HPI 83 y/o WF here for a follow up visit... she has mult med problems as noted...  Followed for general medical purposes w/ hx of COPD, Hyperchol, GERD/ Diverics/ Polyps, DJD/ FM/ Osteoporosis, and anxiety... she likes to take various supplements, and she is also followed by Rodena Medin for GI, and DrKendall for Ortho... ~  SEE PREV EPIC NOTES FOR OLDER DATA>>   CXR 7/13 showed heart at the upper lim of norm in size, lungs are clear/ overinflated c/w COPD, apical pleural thickening, NAD...  LABS 7/13:  FLP- not at goals on diet alone w/ TChol 219 LDL=115;  Chems- wnl;  CBC- wnl;  TSH=1.38;  VitD=52   LABS 7/14:  FLP- improved on diet alone;  Chems- wnl;  CBC- ok w/ Hg=11.7 MCV=93;  TSH=1.06;  VitD=61 ~  Lisa King was [facility] by [facility] 7/9 - 08/25/13 w/ a lower GIB believed to be a self-limited divertic bleed that resolved on it's own;  Hx GERD, divertics, polyps, constip, and hems;  Followed by DrGessner on Famotidine20, & Miralax, last colon 10/09 showed divertics and several polyps (adenomatous & hyperplastic), f/u suggested in 34yrs & now overdue;  She presented w/ rectal bleeding- dark red blood- but no abd pain, no n/v, not lightheaded or syncopal, hemodynamically stable w/o hypotension & Hg in the 9-10 range;  She was seen by DrJacobs who rec conservative approach & outpt f/u to arrange for her needed colonoscopy...  EKG 7/15 showed NSR, rate75, inferior scar & abn r progression...   LABS in hosp 7/15:  Chems- wnl;  CBC- Hg in the 9/10 range;  UA w/ +WBC, RBC, bact & Cult +EColi sens Levaquin...  LABS in office 7/15:  CBC- Hg=9.8, Fe=61 (17%);  TSH=0.95;  Vit B12= 588;  Vit D= 80...   ~  December 04, 2013:  10mo ROV & Lisa King turned 83 y/o- stable overall w/ mult somatic complaints; c/o gait abn & balance issues w/ dizziness and intermittent BPPV symptoms- she has Meclizine for prn use 7 we discussed the ELPE[employer]  maneuver to help as well, reminded to continue ASA81/d... We reviewed the following medical problems during today's office visit >>     Hearing Loss> she has hearing aides from [facility] & prev cerumen impactions removed; I inspected EACs & they are clear today...    COPD, ex Cig smoker> states her last cig was 02/15/12; still refuses breathing meds; states her breathing is good, notes energy fair, & denies cough, sput, hemoptysis, SOB, etc; she is too sedentary & needs to incr exercise...    CHOL> on Fish Oil; FLP 7/14 was improved on diet alone- TChol 191, TG 107, HDL 61, LDL 109... She is not fasting for f/u labs today...    GI- GERD, Divertics, Constip, Polyps, hems> on OTC Pepcid20 (prev refused to take the Omep40) & Miralax/ Senakot-S/ Suppos; Adm 7/15 w/ divertic bleed, self-lim, hemodynam stable, Hg nadir= 8.8; placed on Fe, Prilosec, Miralax & asked to stop ASA, NSAIDs; she refuses Fe due to constip, refuses Prilosec ("I take Pepcid"), still taking her ASA; asked to try Ferrosequels, Miralax daily, Senakot-S Qhs, Suppos prn; she had f/u Colonoscopy 9/15 DrGessner- mod divertics, melanosis, redundant colon, no polyps...     GU> she had a UTI w/ Sens [facility] 7/15 Hosp; treated w/ Levaquin & resolved...     DJD, FM> on Aleve prn, Flexeril10Tid prn, BorageOil & "  tart cherry advanced"; states that air conditioning makes her neck arthritis worse; advised heating pad & anti-inflamm meds prn only...    Osteoporosis> on Calcium, MVI; BMD was -3.5 in spine in 2007 & she has repeatedly refused f/u BMD, Bisphos med rx, or alternative med rx or endocrine consultation; only agrees to take Calcium & Vits despite the potential severe consequences!!!    Anxiety, Insomnia> on Tranxene7.5-1/2Tid prn, Tofranil25-2Qhs...     Anemia> Lower GIB 7/15 believed due to divertics; Hg nadir= 8.8, she is INTOL to Fe w/ constip, rec to take [facility] + Miralax/ Senakot-S/ etc... We reviewed prob list, meds, xrays and labs>  see below for updates >>   LABS 10/15:  CBC- Hg=12.5, MCV=90, Fe=97 (25%sat), Ferritin=18.6.Marland KitchenMarland Kitchen  ~  July 29, 2014:  8 month ROV & recheck> Lisa King returns feeling well w/o new complaints or concerns except an eyelash mite being treated by [employer];  She has NOT been smoking and denies cough, sputum, SOB, CP, etc;  She exercises by walking (but not regularly) & yard work- we discussed joining a Retail banker...  She has not yet chosen a new PCP and she is not fasting today...     COPD, ex-smoker> not on breathing meds and she likes it this way; denies cough, sputum, SOB...    Chol> on diet + Fish oil; last FLP was 7/14 but she insists it was done last yr (nothing in Epic); reminded to come fasting for her appt for labs...    GI> GERD, Divertics, constip, polyps, hems> she is stable & asymptomatic on Prilosec/ Pepcid, Colace/ Miralax w/o heartburn, dysphagia, etc and normal daily BMs at present, no blood seen...    GU> stable w/o dysuria, feels that she emppties adeq, etc...     Osteop> on calcium, MVI, VitD, etc; she has 2 compression fxs, she continues to decline f/u BMD or discussion about Bisphos or alternative rx...     Anxiety/ insomnia> on Tranxene7.5Bid and Imipramine25-2Qhs; she wants to add a new OTC sleep aide called [employer] & I rec she discuss w/ her pharm... PLAN>>  She requests refills for Imipramine rx, otherw doing satis & she is rec to increase her exercise program...  ~  March 04, 2015:  65mo ROV & Lisa King reports breathing well, no new complaints or concerns; she is considering assisted living but doesn't want to move... we reviewed the following medical problems during today's office visit >>     Hearing Loss> she has hearing aides from [facility] & prev cerumen impactions removed; I inspected EACs & they are clear today...    COPD, ex Cig smoker> states her last cig was 02/15/12; still refuses breathing meds; states her breathing is good, notes energy fair, & denies cough,  sput, hemoptysis, SOB, etc; she is too sedentary, walks some at the mall, & needs to incr exercise program...    CHOL> on Fish Oil; FLP 12/16 was improved on diet alone- TChol 186, TG 101, HDL 100, LDL 66... She is not fasting for f/u labs today...    GI- GERD, Divertics, Constip, Polyps, hems> on OTC Pepcid20 (prev refused to take the Omep40) & Miralax/ Senakot-S/ Suppos; Adm 7/15 w/ divertic bleed, self-lim, hemodynam stable, Hg nadir= 8.8; placed on Fe, Prilosec, Miralax & asked to stop ASA, NSAIDs; she refuses Fe due to constip, refuses Prilosec ("I take Pepcid"), still taking her ASA; asked to try Ferrosequels, Miralax daily, Senakot-S Qhs, Suppos prn; she had f/u Colonoscopy 9/15 DrGessner- mod divertics, melanosis, redundant  colon, no polyps...     GU> she had a UTI w/ [facility] 7/15 Hosp; treated w/ Levaquin & resolved...     DJD, FM> on Aleve prn, off Flexeril, BorageOil & "tart cherry advanced"; states that air conditioning makes her neck arthritis worse; advised heating pad & anti-inflamm meds prn only...    Osteoporosis> on Calcium, MVI; BMD was -3.5 in spine in 2007 & she has repeatedly refused f/u BMD, Bisphos med rx, or alternative med rx or endocrine consultation; only agrees to take Calcium & Vits despite the potential severe consequences!!!    Anxiety, Insomnia> on Tranxene7.5-1/2Tid prn, Tofranil25-2Qhs "for my FM"; offered Rx for Trazadone50 for sleep...     Anemia> Lower GIB 7/15 believed due to divertics; Hg nadir= 8.8, she is INTOL to Fe w/ constip, rec to take [facility] + Miralax/ Senakot-S/ etc; Hg improved to 12.5 (2015 & 2016)... EXAM shows Afeb, VSS, O2sat=96% on RA;  Heent- neg, mallampati1;  Chest- clear w/o w/r/r;  Heart- RR w/o m/r/g;  Abd- soft, neg;  Ext- neg w/o c/c/e, Neuro- intact...   LABS 01/2015>  FLP- at goals on diet alone;  Chems- wnl;  CBC- wnl;  Fe=86 (23%sat);  TSH-1.89... IMP/PLAN>>  Lisa King remains reasonably stable; he labs from 01/23/15 look good; she is  c/o insomnia & the Imipramine50 +- Tranxene7.5 don't seem to help; Rec that she try TRAZODONE50mg  Qhs as a trial...   ~  September 01, 2015:  50mo ROV & Lisa King indicates "I'm hanging in there" soon to be 83 y/o & her CC today is allergy to lillie flowers & we discussed decreasing exposure + OTC antihist prn;  In addition she is c/o overriding toe removed by Ortho (left 2nd toe overrides the big toe) & we will refer to Ortho- DrHewitt... we reviewed the following medical problems during today's office visit >>     Hearing Loss> she has hearing aides from [facility] & prev cerumen impactions removed; I inspected EACs & they are clear today...    COPD, ex cig smoker> states her last cig was 02/15/12; still refuses breathing meds/ inhalers; states her breathing is good, notes energy fair, & denies cough, sput, hemoptysis, SOB, etc; she is too sedentary, walks some at the mall, & needs to incr exercise program...    CHOL> on Fish Oil; FLP 12/16 was improved on diet alone- TChol 186, TG 101, HDL 100, LDL 66... She is not fasting for f/u labs today...    GI- GERD, Divertics (w/ hx GIB 2015), Constip, Polyps, hems> on OTC Pepcid20 (prev refused to take the Omep40) & Miralax/ Senakot-S/ Suppos; Adm 7/15 w/ divertic bleed, self-lim, hemodynam stable, Hg nadir= 8.8; placed on Fe, Prilosec, Miralax & asked to stop ASA, NSAIDs; she refuses Fe due to constip, refuses Prilosec ("I take Pepcid"), still taking her ASA; asked to try Ferrosequels, Miralax daily, Senakot-S Qhs, Suppos prn; she had f/u Colonoscopy 9/15 DrGessner- mod divertics, melanosis, redundant colon, no polyps...     GU> she had a UTI w/ Sens [facility] 7/15 Hosp; treated w/ Levaquin & resolved...     DJD, FM> on Aleve prn, off Flexeril, BorageOil & "tart cherry advanced"; states that air conditioning makes her neck arthritis worse; advised heating pad & anti-inflamm meds prn only; 7/17 she asked to have overriding left 2nd toe removed & we will refer to Ortho...     Osteoporosis> on Calcium, MVI; BMD was -3.5 in spine in 2007 & she has repeatedly refused f/u BMD, Bisphos med  rx, or alternative med rx or endocrine consultation; only agrees to take Calcium & Vits despite the potential severe consequences!!!    Anxiety, Insomnia> on Tranxene7.5-1/2Tid prn, Tofranil25-2Qhs "for my FM"; offered Rx for Trazadone50 for sleep=> pt stopped due to "reaction"...    Anemia> Lower GIB 7/15 believed due to divertics; Hg nadir= 8.8, she is INTOL to Fe w/ constip, rec to take [facility] + Miralax/ Senakot-S/ etc; Hg improved to 12.5 (2015 & 2016)... EXAM shows Afeb, VSS, O2sat=98% on RA;  Heent- neg, mallampati1;  Chest- clear w/o w/r/r;  Heart- RR w/o m/r/g;  Abd- soft, neg;  Ext- neg w/o c/c/e, she has overriding left 2nd toe, Neuro- intact...  IMP/PLAN>>  Marzia is pretty set in her ways and refuses to consider inhalers for her COPD, any change in med regimen;  She wants what she wants and requests to have left 2nd toe removed by Ortho 7 we will refer to DrHewitt for eval;  Given PREVNAR-13 pneumonia vaccine today.   ~  March 23, 2016:  38mo ROV w/ pulm/ medical follow up visit>  Lisa King has had quite a lot of "action" over the interval-- in Oct2017 she developed pain & swelling in the right eye & went to the ER;       Hearing Loss> she has hearing aides from [facility] & prev cerumen impactions removed; I inspected EACs & they are clear today...    COPD, ex cig smoker> states her last cig was 02/15/12; still refuses breathing meds/ inhalers; states her breathing is good, notes energy fair, & denies cough, sput, hemoptysis, SOB, etc; she is too sedentary, walks some at the mall, & needs to incr exercise program...    Right carotid- cavernous fistula>     CHOL> on Fish Oil; FLP 12/16 was improved on diet alone- TChol 186, TG 101, HDL 100, LDL 66... She is not fasting for f/u labs today...    GI- GERD, Divertics (w/ hx GIB 2015), Constip, Polyps, hems> on OTC Pepcid20 (prev  refused to take the Omep40) & Miralax/ Senakot-S/ Suppos; Adm 7/15 w/ divertic bleed, self-lim, hemodynam stable, Hg nadir= 8.8; placed on Fe, Prilosec, Miralax & asked to stop ASA, NSAIDs; she refuses Fe due to constip, refuses Prilosec ("I take Pepcid"), still taking her ASA; asked to try Ferrosequels, Miralax daily, Senakot-S Qhs, Suppos prn; she had f/u Colonoscopy 9/15 DrGessner- mod divertics, melanosis, redundant colon, no polyps...     GU> she had a UTI w/ S[facility] 7/15 Hosp; treated w/ Levaquin & resolved...     DJD, FM> on Aleve prn, off Flexeril, BorageOil & "tart cherry advanced"; states that air conditioning makes her neck arthritis worse; advised heating pad & anti-inflamm meds prn only; 7/17 she asked to have overriding left 2nd toe removed & we referred to [facility] but she decided against the surg...    Osteoporosis> on Calcium, MVI; BMD was -3.5 in spine in 2007 & she has repeatedly refused f/u BMD, Bisphos med rx, or alternative med rx or endocrine consultation; only agrees to take Calcium & Vits despite the potential severe consequences!!!    Anxiety, Insomnia> on Tranxene7.5-1/2Tid prn, Tofranil25-2Qhs "for my FM"; offered Rx for Trazadone50 for sleep=> pt stopped due to "reaction"...    Anemia> Lower GIB 7/15 believed due to divertics; Hg nadir= 8.8, she is INTOL to Fe w/ constip, rec to take [facility] + Miralax/ Senakot-S/ etc; Hg improved to 12.5 (2015 & 2016)...  EXAM shows Afeb, VSS, O2sat=96% on RA;  Heent-  sl right eye proptosis, mallampati1;  Chest- sl decr BS bilat but clear w/o w/r/r;  Heart- RR w/o m/r/g;  Abd- soft, neg;  Ext- neg w/o c/c/e, she has overriding left 2nd toe, Neuro- intact IMP/PLAN>>          Problem List:   HEARING LOSS (ICD-389.9) - hx bilat stapes surg for hearing problems by DrKraus yrs ago... states she can't have MRI's due to the stapes implants... now sees DrBates w/ hearing aides bilat... ~  3/14:  She had f/u DrBates> URI & cerumen  impactions- removed + conservative therapy...  ~  7/15:  She has hearing aides from ENT...  COPD (ICD-496) - states her breathing is OK and she denies SOB, and declines use of inhalers, ex= walking... She denies cough, sputum, hemoptysis, CP, palpit SOB, edema... she has declined to do PFT's in the past... ~  CXR 7/11 showed COPD, atherosclerotic calcif in Ao, osteopenic bones, compression of 2 mid thor vertebral bodies, NAD.Marland Kitchen. ~  CXR 7/12 showed hyperinflation, some apical pleural scarring, sm HH, mitral annular calcif, NAD.Marland Kitchen. ~  CXR 7/13 showed heart at the upper lim of norm in size, lungs are clear/ overinflated c/w COPD, apical pleural thickening, NAD...  CIGARETTE SMOKER (ICD-305.1) - states she's quit smoking now "but I cheat now & again"... she refuses Chantix stating that a friend tried it and died!!! We discussed other smoking cessation strategies... ~  7/13:  She states "I don't buy them anymore" but smokes now & then when she can get them... ~  States she quit 02/15/12 & denies on-going resp symptoms of cough, sput, hemoptysis, CP, dyspnea, edema, etc...  HYPERCHOLESTEROLEMIA (ICD-272.0) - she has refused statin Rx in the past... tried Simvastatin 20mg /d 5/09 but states that she developed "muscle cramps, throat closing & insomnia" which resolved off this med- refuses to try other meds "I'm on Fish Oil & diet"... ~  FLP 11/07 showed Tchol 214, TG 130, HDL 51, LDL 125... refused statin, perfers diet alone... ~  FLP 5/09 showed TChol 228, TG 200, HDL 48, LDL 139... rec- start Simva20... pt DC'd it. ~  FLP 7/10 showed TChol 219, TG 171, HDL 54, LDL 127... refuses Fibrate or other lipid meds. ~  FLP 6/11 showed TChol 234, TG 166, HDL 55, LDL 133... discussed diet Rx. ~  FLP 6/12 showed TChol 196, TG 134, HDL 60, LDL 110... Improved on diet Rx! ~  FLP 7/13 showed TChol 219, TG 139, HDL 68, LDL 115... Offered low dose statin but she declines... ~  FLP 7/14 on diet alone showed TChol 191, TG  107, HDL 61, LDL 109... Improved on diet alone.  GERD (ICD-530.81)  -  on PEPCID 20mg Qd but she is adamant that FAMOTIDINE works much better than Pepcid... followed for GI by DrGessner...  DIVERTICULOSIS OF COLON (ICD-562.10) - she denies D, C, blood, ch in bowel habits... COLONIC POLYPS (ICD-211.3) - colonoscopy 7/06 showed several 4-87mm adenomatous polyps in a long redundant colon w/ divertics & hems... f/u colonoscopy 10/09 showed divertics, several polyps= adenomatous & hyperpl w/ f/u colon planned for 8yrs... HEMORRHOIDS (ICD-455.6) ~  2014> she is due for a f/u w/ DrGessner re: f/u colonoscopy => she never sched this f/u visit... ~  7/15: Hosp by [facility] w/ Lower GIB believed to be a diverticv bleed, self limited, hemodynamically stable, Hg~9 range, & set up for outpt f/u by [employer]... ~  Colonoscopy 9/15 by DrGessner showed mod sigmoid divertics, no polyps, extensive melanosis, redundant colon...  ~  10/15: on Pepcid20, not taking Omep; on Miralax prn & advised regular dosing, plus Senakot-S Qhs, and suppos prn; her CC is arthritis and still takes NSAIDs regularly- advised prn dosing & regular Omeprazole40 30 min before the 1st meal each day...  DEGENERATIVE JOINT DISEASE (ICD-715.90) - hx of leg pain w/ norm ABI's 2007... eval by Janett Billow for Ortho w/ "arthritis and scoliosis" treated w/ muscle relaxer and improved... she states she can't get MRI due to prev stapes surg... ~  7/11: she notes that black cherry concentrate is good "it took a freind's pain away"... ~  Arthritis is her CC> on Aleve regularly + Tylenol prn; advised intermittwent prn dosing f NSAIDs and use the Omep40 daily...  FIBROMYALGIA (ICD-729.1) - on IMIPRAMINE 25mg - 2tabs daily, + Flexeril10Tid prn... ~  7/12:  She notes that her FM pain was exac by the dog bite 5/12...  OSTEOPOROSIS (ICD-733.00) - known severe osteoporosis w/ BMD 11/07 showing TScores -3.5 in spine & -2.6 in left fem neck... pt states intol to [facility]  and refused other bisphosphonates or miacalcin... in addition she has refuses Endocrine referral to discuss further... "I take calcium and vitamins" but I carefully explained how that is not sufficient and that she is likely to suffer a serious compression fracture of her sp, or a fall could easily fracture her hip- she refuses Rx or further help... I have advised her to talk to her GYN about this as well. ~  7/10:  We discussed osteoporosis options again including bisphos, reclast, miacalcin, forteo, & the new prolia- she is not interested in Rx or in Endocrine second opinion... ~  7/11:  Ditto... ~  7/12:  I reviewed all the above & offered the new ATELVIA 35mg /wk (fewer GI problems) and she agreed to try it, Rx written==> stopped on her own. ~  7/13:  We reviewed the situation w/ her bones, osteoporosis & her risks; she again refuses meds for this problem, and declines offers for subspec referral (Endocrine, etc)... ~  7/14:  She once again confirms her staunch refusal to consider f/u BMD, med rx of any kind for her osteoporosis, or 2nd opinion consultation... ~  Labs 7/15 showed Vit D = 80...  ANXIETY (ICD-300.00)- she takes Tranxene7.5 at bedtime to help her sleep...  ANEMIA>  She was [facility] 7/15 w/ Lower GIB believed to be due to divertics, managed conservatively & referred to GI as outpt for f/u... ~  Baseline labs showed Hg ~12 range... ~  Labs 7/15 in hosp showed Hg= 9-10 range w/ nadir=8.8; placed on Fe at disch but poorly tolerated.... ~  Labs 7/15 in office f/u showed Hg= 9.8, Fe= 61 (17%sat), B12= 588... rec to try Ferrosequels... ~  Labs 10/15 showed Hg= 12.5, Fe= 97 (25%sat), Ferritin= 19... 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 she takes ASA 81mg /d... had Tetanus shot 1997 & Pneumovax 2000... she refuses Flu shots! ~  Supplement meds include> Calc w/ D, MVI, Tums, FishOil, BorageOil, FlaxSeedOil, etc... ~  10/15: pt asked to f/u in 81mo w/  Fasting labs, but she refused- wants routine ROV in 106yr...   Past Surgical History:  Procedure Laterality Date  . CATARACT EXTRACTION, BILATERAL  2009  . COLONOSCOPY  2009, 2006   . stapes surgery     Dr Dorma Russell  . TONSILLECTOMY AND ADENOIDECTOMY     as a child    Outpatient Encounter Prescriptions as of 03/23/2016  Medication Sig Dispense Refill  .  aspirin 81 MG tablet Take 81 mg by mouth daily.    . calcium carbonate (TUMS EX) 750 MG chewable tablet Chew 750 mg by mouth daily.     . Calcium Carbonate-Vit D-Min (CALCIUM 600+D PLUS MINERALS) 600-400 MG-UNIT TABS Take 1 tablet by mouth daily.     . cholecalciferol (VITAMIN D) 1000 UNITS tablet Take 1,000 Units by mouth daily.    . famotidine (PEPCID) 20 MG tablet TAKE ONE TABLET BY MOUTH ONCE DAILY 30 tablet 5  . imipramine (TOFRANIL) 25 MG tablet TAKE TWO TABLETS BY MOUTH ONCE DAILY 60 tablet 5  . [DISCONTINUED] ciprofloxacin (CIPRO) 250 MG tablet Take 1 tablet (250 mg total) by mouth 2 (two) times daily. (Patient not taking: Reported on 03/23/2016) 14 tablet 0  . [DISCONTINUED] clorazepate (TRANXENE) 7.5 MG tablet TAKE ONE TABLET BY MOUTH TWICE DAILY AS NEEDED (Patient not taking: Reported on 03/23/2016) 30 tablet 5  . [DISCONTINUED] cyclobenzaprine (FLEXERIL) 10 MG tablet TAKE ONE-HALF TO ONE TABLET BY MOUTH THREE TIMES DAILY AS NEEDED FOR MUSCLE SPASM (Patient not taking: Reported on 02/13/2016) 25 tablet 0  . [DISCONTINUED] cyclobenzaprine (FLEXERIL) 10 MG tablet Take 1 tablet (10 mg total) by mouth every 8 (eight) hours as needed for muscle spasms. (Patient not taking: Reported on 03/23/2016) 50 tablet 0  . [DISCONTINUED] docusate sodium (COLACE) 100 MG capsule Take 100 mg by mouth daily.     . [DISCONTINUED] fish oil-omega-3 fatty acids 1000 MG capsule Take 2 g by mouth daily.      . [DISCONTINUED] fluorometholone (FML) 0.1 % ophthalmic suspension Place 1 drop into the right eye 2 (two) times daily as needed.     . [DISCONTINUED]  HYDROcodone-acetaminophen (NORCO/VICODIN) 5-325 MG tablet Take 1 tablet by mouth every 6 (six) hours as needed for severe pain. (Patient not taking: Reported on 03/23/2016) 6 tablet 0  . [DISCONTINUED] meclizine (ANTIVERT) 25 MG tablet Take 1 tablet (25 mg total) by mouth 4 (four) times daily as needed for dizziness. (Patient not taking: Reported on 02/13/2016) 30 tablet 0  . [DISCONTINUED] Multiple Vitamin (MULTI-VITAMINS) TABS Take 1 tablet by mouth daily.     . [DISCONTINUED] naproxen sodium (ANAPROX) 220 MG tablet Take 220 mg by mouth at bedtime.    . [DISCONTINUED] traZODone (DESYREL) 50 MG tablet Take 1 tablet (50 mg total) by mouth at bedtime as needed for sleep. (Patient not taking: Reported on 02/13/2016) 15 tablet 0   No facility-administered encounter medications on file as of 03/23/2016.     Allergies  Allergen Reactions  . Penicillins Anaphylaxis  . Bisphosphonates Other (See Comments)    [name]  . Influenza Vaccines     Pt reported  . Simvastatin     Unknown   . Trazodone And Nefazodone     Felt her throat was closing up/kept her awake  . Sulfonamide Derivatives Rash and Other (See Comments)    blisters    Immunization History  Administered Date(s) Administered  . Pneumococcal Conjugate-13 09/01/2015  . Pneumococcal Polysaccharide-23 07/07/1998  . Td 08/21/2009    Current Medications, Allergies, Past Medical History, Past Surgical History, Family History, and Social History were reviewed in [employer] record.   Review of Systems       See HPI - all other systems neg except as noted...       The patient complains of dyspnea on exertion, severe indigestion/heartburn, muscle weakness, and difficulty walking.  The patient denies anorexia, fever, weight loss, weight gain, vision  loss, decreased hearing, hoarseness, chest pain, syncope, peripheral edema, prolonged cough, headaches, hemoptysis, abdominal pain, melena, hematochezia, hematuria,  incontinence, suspicious skin lesions, transient blindness, depression, unusual weight change, abnormal bleeding, enlarged lymph nodes, and angioedema.     Objective:   Physical Exam    WD, WN, 83 y/o WF in NAD... GENERAL:  Alert & oriented; pleasant & cooperative... HEENT:  [place]/AT, EOM-wnl, PERRLA, EACs-clear, TMs-wnl, NOSE-clear, THROAT-clear & wnl. NECK:  Supple w/ fairROM; no JVD; normal carotid impulses w/o bruits; no thyromegaly or nodules palpated; no lymphadenopathy. CHEST:  decr BS bilat, clear to P & A; without wheezes/ rales/ or rhonchi heard... HEART:  Regular Rhythm; without murmurs/ rubs/ or gallops detected... ABDOMEN:  Soft & nontender; normal bowel sounds; no organomegaly or masses palpated... EXT: without deformities, mod arthritic changes; no varicose veins/ +venous insuffic/ tr edema. NEURO:  CN's intact; no focal neuro deficits... DERM:  No lesions noted; prev nasal skin surg; no rash etc...  RADIOLOGY DATA:  Reviewed in the EPIC EMR & discussed w/ the patient...  LABORATORY DATA:  Reviewed in the EPIC EMR & discussed w/ the patient...   Assessment & Plan:    COPD>  she's quit smoking & denies SOB etc but she is clearly too sedentary & asked to incr activity/ exercise...  CHOL>  On diet alone & last FLP was improved & close to goals...   GI>  GERD/ Divertics/ Polyps>  Followed by Rodena Medin for GI; stable on Famotidine which she swears works better that [employer]; due for 62yr f/u colon in 2014... GIB, likely divertic bleed 7/15> self limited, feeling better, c/o chr constip & rec to take Miralax & Senakot-S (but she wants Linzess- like her brother); referred to GI for f/u eval & to sched colon=> done 9/15 [place]-  divertics, melanosis, redundant colon...  DJD/ FM>  Stable overall; uses OTC anti-inflamm as needed + Imipramine Qhs... 7/17> she want overriding left 2nd toe removed=> we will refer to Ortho for eval...  Osteoporosis/ Compression fx>  She has 2  compression fx that are old, she is very high risk, we had another long conversation about her osteoporosis & the risk it imposes, we reviewed the avail therapies (again) but she declines all avail therapies & refuses second opinion consult etc...   Anxiety>  She uses the Chlorazepate as needed...  Anemia> due to GIB; f/u Hg=9.8, Fe=61, and rec to take FERROSEQUELS=>  Follow up labs 10/15 w/ Hg=12.5, Fe=97...   Patient's Medications  New Prescriptions   No medications on file  Previous Medications   ASPIRIN 81 MG TABLET    Take 81 mg by mouth daily.   CALCIUM CARBONATE (TUMS EX) 750 MG CHEWABLE TABLET    Chew 750 mg by mouth daily.    CALCIUM CARBONATE-VIT D-MIN (CALCIUM 600+D PLUS MINERALS) 600-400 MG-UNIT TABS    Take 1 tablet by mouth daily.    CHOLECALCIFEROL (VITAMIN D) 1000 UNITS TABLET    Take 1,000 Units by mouth daily.   FAMOTIDINE (PEPCID) 20 MG TABLET    TAKE ONE TABLET BY MOUTH ONCE DAILY   IMIPRAMINE (TOFRANIL) 25 MG TABLET    TAKE TWO TABLETS BY MOUTH ONCE DAILY  Modified Medications   No medications on file  Discontinued Medications   CIPROFLOXACIN (CIPRO) 250 MG TABLET    Take 1 tablet (250 mg total) by mouth 2 (two) times daily.   CLORAZEPATE (TRANXENE) 7.5 MG TABLET    TAKE ONE TABLET BY MOUTH TWICE DAILY AS NEEDED  CYCLOBENZAPRINE (FLEXERIL) 10 MG TABLET    TAKE ONE-HALF TO ONE TABLET BY MOUTH THREE TIMES DAILY AS NEEDED FOR MUSCLE SPASM   CYCLOBENZAPRINE (FLEXERIL) 10 MG TABLET    Take 1 tablet (10 mg total) by mouth every 8 (eight) hours as needed for muscle spasms.   DOCUSATE SODIUM (COLACE) 100 MG CAPSULE    Take 100 mg by mouth daily.    FISH OIL-OMEGA-3 FATTY ACIDS 1000 MG CAPSULE    Take 2 g by mouth daily.     FLUOROMETHOLONE (FML) 0.1 % OPHTHALMIC SUSPENSION    Place 1 drop into the right eye 2 (two) times daily as needed.    HYDROCODONE-ACETAMINOPHEN (NORCO/VICODIN) 5-325 MG TABLET    Take 1 tablet by mouth every 6 (six) hours as needed for severe pain.    MECLIZINE (ANTIVERT) 25 MG TABLET    Take 1 tablet (25 mg total) by mouth 4 (four) times daily as needed for dizziness.   MULTIPLE VITAMIN (MULTI-VITAMINS) TABS    Take 1 tablet by mouth daily.    NAPROXEN SODIUM (ANAPROX) 220 MG TABLET    Take 220 mg by mouth at bedtime.   TRAZODONE (DESYREL) 50 MG TABLET    Take 1 tablet (50 mg total) by mouth at bedtime as needed for sleep.

## 2016-03-23 NOTE — Patient Instructions (Signed)
Lisa King, it was great seeing you again...    We updated your MED list in the EPIC computer system...  We reviewed your Ophthalmology & Neurosurg eval at [facility]...    Very pleased you are doing so well!  Keep up the good work w/ your therapists to regainyour strength etc...  Please call for any questions, or if we can be of service in any way.Marland KitchenMarland Kitchen

## 2016-03-23 NOTE — Telephone Encounter (Signed)
Per SN---  SN is aware of the change in medications from todays visit.  Ok to send the order for the d/c of these meds.    This will be a good opportunity for the doctor there to meet the pt and get to know her and her medications that she may need.

## 2016-03-24 NOTE — Telephone Encounter (Signed)
LMTCB

## 2016-03-25 NOTE — Telephone Encounter (Signed)
lmomtcb x 2  

## 2016-03-25 NOTE — Telephone Encounter (Signed)
Spoke with Lisa King at [facility]. She is aware of SN's response. D/C order will be faxed to [phone]. Nothing further was needed.

## 2016-03-29 ENCOUNTER — Telehealth: Payer: Self-pay | Admitting: Pulmonary Disease

## 2016-03-29 NOTE — Telephone Encounter (Signed)
Also, states states she had sent over some order requests last week to be signed but hasn't received these either.

## 2016-03-29 NOTE — Telephone Encounter (Signed)
Will check in SN papers to see if these are in there.  SN had signed some papers last week for this pt and these were faxed.  Will follow up with this message.

## 2016-03-30 NOTE — Telephone Encounter (Signed)
Lm for Tywan with [facility] to return our call to f/u on.

## 2016-03-30 NOTE — Telephone Encounter (Signed)
All forms that have been received on this pt have been signed by SN and have been faxed back today.

## 2016-04-01 ENCOUNTER — Telehealth: Payer: Self-pay | Admitting: Pulmonary Disease

## 2016-04-01 NOTE — Telephone Encounter (Signed)
This has been faxed once again. This d/c order has been faxed several times now. Nothing further was needed.

## 2016-04-08 ENCOUNTER — Non-Acute Institutional Stay: Payer: Medicare Other | Admitting: Nurse Practitioner

## 2016-04-08 ENCOUNTER — Encounter: Payer: Self-pay | Admitting: Nurse Practitioner

## 2016-04-08 VITALS — BP 149/74 | HR 80 | Temp 97.2°F | Resp 20 | Ht 62.0 in | Wt 127.2 lb

## 2016-04-08 DIAGNOSIS — K219 Gastro-esophageal reflux disease without esophagitis: Secondary | ICD-10-CM

## 2016-04-08 DIAGNOSIS — L600 Ingrowing nail: Secondary | ICD-10-CM | POA: Diagnosis not present

## 2016-04-08 DIAGNOSIS — M2042 Other hammer toe(s) (acquired), left foot: Secondary | ICD-10-CM

## 2016-04-08 DIAGNOSIS — D62 Acute posthemorrhagic anemia: Secondary | ICD-10-CM

## 2016-04-08 DIAGNOSIS — F411 Generalized anxiety disorder: Secondary | ICD-10-CM

## 2016-04-08 DIAGNOSIS — K5901 Slow transit constipation: Secondary | ICD-10-CM

## 2016-04-08 HISTORY — DX: Other hammer toe(s) (acquired), left foot: M20.42

## 2016-04-08 NOTE — Assessment & Plan Note (Signed)
The left great toe

## 2016-04-08 NOTE — Progress Notes (Signed)
Provider:  Marlana Latus NP  Location:   [facility]   Place of Service:  Clinic (12)  PCP: Noralee Space, MD Patient Care Team: Noralee Space, MD as PCP - General (Pulmonary Disease)  Extended Emergency Contact Information Primary Emergency Contact: Culbreth,Robin Address: [address]          [place] [address] [place] Phone: [phone] Relation: Daughter  Code Status: DNR Goals of Care: Advanced Directive information Advanced Directives 04/08/2016  Does Patient Have a Medical Advance Directive? Yes  Type of Paramedic of [place];Living will  Does patient want to make changes to medical advance directive? No - Patient declined  Copy of [place] in Chart? Yes  Would patient like information on creating a medical advance directive? -  Pre-existing out of facility DNR order (yellow form or pink MOST form) -      Chief Complaint  Patient presents with  . Establish Care    HPI: Patient is a 83 y.o. female seen today for admission to clinic [place], Hx of GERD, stable on Famotidine, mood is stable on Imipramine 25mg . She c/o constipation, used to take MiraLax at home.   Past Medical History:  Diagnosis Date  . Anxiety   . Cigarette smoker   . Colon polyps 2009   TWO TUBULAR ADENOMAS AND HYPERPLASTIC POLYPS  . COPD (chronic obstructive pulmonary disease) ([employer])   . Diverticulosis of colon   . DJD (degenerative joint disease)   . Dog bite of limb 07/14/2010   Dog bite 07/10/10 - dogs shots utd Last td was 08/2009  Localized tx only.    . Fibromyalgia   . GERD (gastroesophageal reflux disease)   . Hearing loss   . Hemorrhoids   . Hypercholesterolemia   . Osteoporosis    Past Surgical History:  Procedure Laterality Date  . CATARACT EXTRACTION, BILATERAL  2009  . COLONOSCOPY  2009, 2006   . stapes surgery     Dr Thornell Mule  . TONSILLECTOMY AND ADENOIDECTOMY     as a child    reports that she quit smoking about 6  years ago. Her smoking use included Cigarettes. She has never used smokeless tobacco. She reports that she drinks alcohol. She reports that she does not use drugs. Social History   Social History  . Marital status: Married    Spouse name: N/A  . Number of children: N/A  . Years of education: N/A   Occupational History  . retired   . works some weekends at [employer]  . Smoking status: Former Smoker    Types: Cigarettes    Quit date: 02/15/2010  . Smokeless tobacco: Never Used  . Alcohol use Yes     Comment: occasional glass of wine  . Drug use: No  . Sexual activity: No   Other Topics Concern  . Not on file   Social History Narrative   Pt works some weekends at [employer]   Caffeine use - daily coffee in the mornings   Patient gets regular exercise > tries to walk daily for exercise    Functional Status Survey:    Family History  Problem Relation Age of Onset  . Heart disease Father   . COPD Father   . Hypertension Mother   . Arthritis Mother     Health Maintenance  Topic Date Due  . DEXA SCAN  09/28/1998  . INFLUENZA VACCINE  05/12/2016 (  Originally 09/16/2015)  . PNA vac Low Risk Adult (2 of 2 - PPSV23) 08/31/2016  . TETANUS/TDAP  08/22/2019    Allergies  Allergen Reactions  . Penicillins Anaphylaxis  . Bisphosphonates Other (See Comments)    [name]  . Influenza Vaccines     Pt reported  . Simvastatin     Unknown   . Trazodone And Nefazodone     Felt her throat was closing up/kept her awake  . Sulfonamide Derivatives Rash and Other (See Comments)    blisters    Allergies as of 04/08/2016      Reactions   Penicillins Anaphylaxis   Bisphosphonates Other (See Comments)   [name]   Influenza Vaccines    Pt reported   Simvastatin    Unknown    Trazodone And Nefazodone    Felt her throat was closing up/kept her awake   Sulfonamide Derivatives Rash, Other (See Comments)   blisters        Medication List       Accurate as of 04/08/16  4:32 PM. Always use your most recent med list.          aspirin 81 MG tablet Take 81 mg by mouth daily.   CALCIUM 600+D PLUS MINERALS 600-400 MG-UNIT Tabs Take 1 tablet by mouth daily.   calcium carbonate 750 MG chewable tablet Commonly known as:  TUMS EX Chew 750 mg by mouth daily.   cholecalciferol 1000 units tablet Commonly known as:  VITAMIN D Take 1,000 Units by mouth daily.   famotidine 20 MG tablet Commonly known as:  PEPCID TAKE ONE TABLET BY MOUTH ONCE DAILY   imipramine 25 MG tablet Commonly known as:  TOFRANIL TAKE TWO TABLETS BY MOUTH ONCE DAILY       Review of Systems  Constitutional: Negative for activity change, appetite change, chills, diaphoresis, fatigue, fever and unexpected weight change.  HENT: Positive for hearing loss. Negative for congestion, dental problem, drooling, ear discharge, ear pain, facial swelling, mouth sores, nosebleeds, postnasal drip, sinus pain, sinus pressure, sneezing, sore throat, tinnitus, trouble swallowing and voice change.        Hearing aid right   Eyes: Negative for photophobia, pain, discharge, redness, itching and visual disturbance.  Respiratory: Negative for apnea, cough, choking, chest tightness, shortness of breath, wheezing and stridor.   Cardiovascular: Negative for chest pain, palpitations and leg swelling.  Gastrointestinal: Positive for constipation. Negative for abdominal distention, abdominal pain, blood in stool, diarrhea, nausea and rectal pain.  Endocrine: Negative for heat intolerance, polydipsia, polyphagia and polyuria.  Genitourinary: Negative for difficulty urinating, dyspareunia, dysuria, enuresis, flank pain, frequency, hematuria and urgency.  Musculoskeletal: Positive for arthralgias, gait problem and myalgias. Negative for back pain, joint swelling, neck pain and neck stiffness.       Walker, falls  Skin: Negative for color change, pallor and  wound.       The left great toe ingrown toenail, left 2nd hammer toe  Allergic/Immunologic: Negative for environmental allergies, food allergies and immunocompromised state.  Neurological: Negative for dizziness, tremors, seizures, syncope, facial asymmetry, speech difficulty, weakness, light-headedness, numbness and headaches.  Hematological: Negative for adenopathy. Does not bruise/bleed easily.  Psychiatric/Behavioral: Positive for sleep disturbance. Negative for agitation, behavioral problems, confusion, decreased concentration, dysphoric mood and hallucinations. The patient is nervous/anxious. The patient is not hyperactive.        Cried during today's visit when she mentioned her mother.     Vitals:   04/08/16 1536  BP: Marland Kitchen)  149/74  Pulse: 80  Resp: 20  Temp: 97.2 F (36.2 C)  SpO2: 96%  Weight: 127 lb 3.2 oz (57.7 kg)  Height: 5\' 2"  (1.575 m)   Body mass index is 23.27 kg/m. Physical Exam  Constitutional: She is oriented to person, place, and time. She appears well-developed and well-nourished.  HENT:  Head: Normocephalic and atraumatic.  Right Ear: External ear normal.  Left Ear: External ear normal.  Nose: Nose normal.  Mouth/Throat: Oropharynx is clear and moist.  Eyes: Conjunctivae and EOM are normal. Pupils are equal, round, and reactive to light. Right eye exhibits no discharge. Left eye exhibits no discharge. No scleral icterus.  Neck: Normal range of motion. Neck supple. No JVD present. No tracheal deviation present. No thyromegaly present.  Cardiovascular: Normal rate, regular rhythm, normal heart sounds and intact distal pulses.  Exam reveals no friction rub.   No murmur heard. Pulmonary/Chest: Effort normal and breath sounds normal. No stridor. No respiratory distress. She has no wheezes. She has no rales. She exhibits no tenderness.  Abdominal: Soft. Bowel sounds are normal. She exhibits no distension. There is no tenderness. There is no rebound and no guarding.    Musculoskeletal: Normal range of motion. She exhibits no edema, tenderness or deformity.  Neurological: She is alert and oriented to person, place, and time. She displays normal reflexes. No cranial nerve deficit. She exhibits normal muscle tone. Coordination normal.  Skin: Skin is warm and dry. No rash noted. She is not diaphoretic. No erythema.  Psychiatric: She has a normal mood and affect. Her behavior is normal. Judgment and thought content normal.    Labs reviewed: Basic Metabolic Panel:  Recent Labs  12/07/15 2047  NA 141  K 3.9  CL 105  GLUCOSE 87  BUN 16  CREATININE 1.00   Liver Function Tests: No results for input(s): AST, ALT, ALKPHOS, BILITOT, PROT, ALBUMIN in the last 8760 hours. No results for input(s): LIPASE, AMYLASE in the last 8760 hours. No results for input(s): AMMONIA in the last 8760 hours. CBC:  Recent Labs  12/07/15 2037 12/07/15 2047  WBC 5.5  --   NEUTROABS 3.2  --   HGB 11.6* 11.2*  HCT 34.6* 33.0*  MCV 92.0  --   PLT 242  --    Cardiac Enzymes: No results for input(s): CKTOTAL, CKMB, CKMBINDEX, TROPONINI in the last 8760 hours. BNP: Invalid input(s): POCBNP No results found for: HGBA1C Lab Results  Component Value Date   TSH 1.89 01/23/2015   Lab Results  Component Value Date   VITAMINB12 588 09/03/2013   No results found for: FOLATE Lab Results  Component Value Date   IRON 86 01/23/2015   FERRITIN 18.6 11/28/2013    Imaging and Procedures obtained prior to SNF admission: Dg Ribs Unilateral W/chest Right  Result Date: 02/13/2016 CLINICAL DATA:  Acute onset of right back and rib pain, after fall. Initial encounter. EXAM: RIGHT RIBS AND CHEST - 3+ VIEW COMPARISON:  Chest radiograph performed 08/25/2011 FINDINGS: No displaced rib fractures are seen. The lungs are well-aerated and clear. There is no evidence of focal opacification, pleural effusion or pneumothorax. The cardiomediastinal silhouette is borderline normal in size. No  acute osseous abnormalities are seen. IMPRESSION: No displaced rib fracture seen. No acute cardiopulmonary process identified. Electronically Signed   By: Garald Balding M.D.   On: 02/13/2016 18:47    Assessment/Plan GERD Stable, continue Famotidine. Update CBC CMP TSH MMSE  Constipation Stable, diet controlled, prn MiraLax daily  Anxiety  state Stable, continue Imipramine  Acute blood loss anemia Last Hgb 11.2 12/07/15  Ingrown toenail without infection The left great toe   Hammer toe of second toe of left foot Left 2nd   Family/ staff Communication: AL  Labs/tests ordered: CBC CMP TSH MMSE

## 2016-04-08 NOTE — Assessment & Plan Note (Signed)
Stable, continue Famotidine. Update CBC CMP TSH MMSE

## 2016-04-08 NOTE — Assessment & Plan Note (Signed)
Left 2nd

## 2016-04-08 NOTE — Assessment & Plan Note (Signed)
Last Hgb 11.2 12/07/15

## 2016-04-08 NOTE — Assessment & Plan Note (Signed)
Stable, continue Imipramine 

## 2016-04-08 NOTE — Assessment & Plan Note (Addendum)
Stable, diet controlled, prn MiraLax daily

## 2016-04-12 ENCOUNTER — Encounter: Payer: Self-pay | Admitting: Internal Medicine

## 2016-04-12 ENCOUNTER — Non-Acute Institutional Stay: Payer: Medicare Other | Admitting: Internal Medicine

## 2016-04-12 DIAGNOSIS — J449 Chronic obstructive pulmonary disease, unspecified: Secondary | ICD-10-CM | POA: Diagnosis not present

## 2016-04-12 DIAGNOSIS — E7800 Pure hypercholesterolemia, unspecified: Secondary | ICD-10-CM | POA: Diagnosis not present

## 2016-04-12 DIAGNOSIS — F411 Generalized anxiety disorder: Secondary | ICD-10-CM | POA: Diagnosis not present

## 2016-04-12 DIAGNOSIS — K219 Gastro-esophageal reflux disease without esophagitis: Secondary | ICD-10-CM

## 2016-04-12 NOTE — Progress Notes (Signed)
History and Physical    Location:  [facility] Room Number: AL 917 Place of Service:  ALF (13)  PCP: Jeanmarie Hubert, MD Patient Care Team: Estill Dooms, MD as PCP - General (Internal Medicine) Man Otho Darner, NP as Nurse Practitioner (Internal Medicine)  Extended Emergency Contact Information Primary Emergency Contact: Culbreth,Robin Address: [address]          [place] [address] [place] Phone: [phone] Relation: Daughter  Code Status: Full Code Goals of Care: Advanced Directive information Advanced Directives 04/12/2016  Does Patient Have a Medical Advance Directive? Yes  Type of Advance Directive Living will  Does patient want to make changes to medical advance directive? -  Copy of [place] in Chart? -  Would patient like information on creating a medical advance directive? -  Pre-existing out of facility DNR order (yellow form or pink MOST form) -      Chief Complaint  Patient presents with  . New Admit To AL    new admit from home     HPI: Patient is a 83 y.o. female seen today for admission to [facility] on 03/11/16. She was admitted from [facility] for increasing frailty.  Chronic stable conditions include COPD, GERD, constipation, HLD, anemia, anxiety, deaf, memory decline, and osteoporosis.    Past Medical History:  Diagnosis Date  . Acute blood loss anemia 08/23/2013   04/13/16 TSH 1.20, Na 141, K 4.2, Bun 15, creat 0.79, wbc 5.5, Hgb 11.5, plt 283  . Anxiety   . Anxiety state 07/02/2007   Qualifier: Diagnosis of  By: Lenna Gilford MD, Deborra Medina   . 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 Cigarette smoker   . Colon polyps 2009   TWO TUBULAR ADENOMAS AND HYPERPLASTIC POLYPS  . Constipation 09/14/2013  . COPD (chronic obstructive pulmonary disease) ([place])   . Diverticulosis of colon   . DJD (degenerative joint disease)   . Dog bite of limb 07/14/2010   Dog bite 07/10/10 -  dogs shots utd Last td was 08/2009  Localized tx only.    . Fibromyalgia   . GERD 12/19/2006   Qualifier: Diagnosis of  By: Julien Girt CMA, Marliss Czar  04/13/16 TSH 1.20, Na 141, K 4.2, Bun 15, creat 0.79, wbc 5.5, Hgb 11.5, plt 283   . GERD (gastroesophageal reflux disease)   . Hammer toe of second toe of left foot 04/08/2016   Left 2nd  . Hearing loss   . Hemorrhoids   . Hypercholesterolemia   . Osteoarthritis 12/19/2006   Qualifier: Diagnosis of  By: Julien Girt CMA, Marliss Czar    . Osteoporosis    Past Surgical History:  Procedure Laterality Date  . CATARACT EXTRACTION, BILATERAL  2009  . COLONOSCOPY  2009, 2006   . stapes surgery     Dr Thornell Mule  . TONSILLECTOMY AND ADENOIDECTOMY     as a child    reports that she quit smoking about 6 years ago. Her smoking use included Cigarettes. She has never used smokeless tobacco. She reports that she drinks alcohol. She reports that she does not use drugs. Social History   Social History  . Marital status: Married    Spouse name: N/A  . Number of children: N/A  . Years of education: N/A   Occupational History  . retired   . works some weekends at [employer] Topics  . Smoking status: Former Smoker  Types: Cigarettes    Quit date: 02/15/2010  . Smokeless tobacco: Never Used  . Alcohol use Yes     Comment: occasional glass of wine  . Drug use: No  . Sexual activity: No   Other Topics Concern  . Not on file   Social History Narrative   Pt works some weekends at [employer]   Caffeine use - daily coffee in the mornings   Patient gets regular exercise > tries to walk daily for exercise   Moved to [place] 03/11/16   Widowed   Former Smoker-stopped 2012   Alcohol occasionally glass of wine   Living Will        Family History  Problem Relation Age of Onset  . Heart disease Father   . COPD Father   . Hypertension Mother   . Arthritis Mother     Health Maintenance  Topic Date Due  . DEXA  SCAN  09/28/1998  . INFLUENZA VACCINE  05/12/2016 (Originally 09/16/2015)  . PNA vac Low Risk Adult (2 of 2 - PPSV23) 08/31/2016  . TETANUS/TDAP  08/22/2019    Allergies  Allergen Reactions  . Penicillins Anaphylaxis  . Bisphosphonates Other (See Comments)    [name]  . Influenza Vaccines     Pt reported  . Simvastatin     Unknown   . Trazodone And Nefazodone     Felt her throat was closing up/kept her awake  . Sulfonamide Derivatives Rash and Other (See Comments)    blisters    Allergies as of 04/12/2016      Reactions   Penicillins Anaphylaxis   Bisphosphonates Other (See Comments)   [name]   Influenza Vaccines    Pt reported   Simvastatin    Unknown    Trazodone And Nefazodone    Felt her throat was closing up/kept her awake   Sulfonamide Derivatives Rash, Other (See Comments)   blisters      Medication List       Accurate as of 04/12/16  2:46 PM. Always use your most recent med list.          aspirin 81 MG tablet Take 81 mg by mouth daily.   CALCIUM 600+D PLUS MINERALS 600-400 MG-UNIT Tabs Take 1 tablet by mouth daily.   calcium carbonate 750 MG chewable tablet Commonly known as:  TUMS EX Chew 750 mg by mouth daily.   cholecalciferol 1000 units tablet Commonly known as:  VITAMIN D Take 1,000 Units by mouth daily.   famotidine 20 MG tablet Commonly known as:  PEPCID TAKE ONE TABLET BY MOUTH ONCE DAILY   imipramine 25 MG tablet Commonly known as:  TOFRANIL TAKE TWO TABLETS BY MOUTH ONCE DAILY   polyethylene glycol packet Commonly known as:  MIRALAX / GLYCOLAX Take 17 g by mouth. Give 17 gm in 4 oz of liquid daily as needed       Review of Systems  Constitutional: Positive for fatigue. Negative for activity change, appetite change, chills, diaphoresis, fever and unexpected weight change.       Night sweats  HENT: Positive for hearing loss. Negative for congestion, dental problem, drooling, ear discharge, ear pain, facial  swelling, mouth sores, nosebleeds, postnasal drip, sinus pain, sinus pressure, sneezing, sore throat, tinnitus, trouble swallowing and voice change.        Hearing aid right   Eyes: Negative for photophobia, pain, discharge, redness, itching and visual disturbance.  Respiratory: Negative for apnea, cough, choking,  chest tightness, shortness of breath, wheezing and stridor.   Cardiovascular: Negative for chest pain, palpitations and leg swelling.  Gastrointestinal: Positive for constipation. Negative for abdominal distention, abdominal pain, blood in stool, diarrhea, nausea and rectal pain.  Endocrine: Negative for heat intolerance, polydipsia, polyphagia and polyuria.  Genitourinary: Negative for difficulty urinating, dyspareunia, dysuria, enuresis, flank pain, frequency, hematuria and urgency.  Musculoskeletal: Positive for arthralgias, gait problem and myalgias. Negative for back pain, joint swelling, neck pain and neck stiffness.       Walker, falls  Skin: Negative for color change, pallor and wound.       The left great toe ingrown toenail, left 2nd hammer toe  Allergic/Immunologic: Negative for environmental allergies, food allergies and immunocompromised state.  Neurological: Negative for dizziness, tremors, seizures, syncope, facial asymmetry, speech difficulty, weakness, light-headedness, numbness and headaches.  Hematological: Negative for adenopathy. Does not bruise/bleed easily.  Psychiatric/Behavioral: Positive for sleep disturbance. Negative for agitation, behavioral problems, confusion, decreased concentration, dysphoric mood and hallucinations. The patient is nervous/anxious. The patient is not hyperactive.        Cried during today's visit when she mentioned her mother.     Vitals:   04/12/16 1435  BP: 126/80  Pulse: 72  Resp: 18  Temp: 97.5 F (36.4 C)  Weight: 127 lb (57.6 kg)  Height: 5\' 2"  (1.575 m)   Body mass index is 23.23 kg/m. Physical Exam  Constitutional:  She is oriented to person, place, and time. She appears well-developed and well-nourished.  HENT:  Head: Normocephalic and atraumatic.  Right Ear: External ear normal.  Left Ear: External ear normal.  Nose: Nose normal.  Mouth/Throat: Oropharynx is clear and moist.  Hearing loss. Puffy at the right cheek.  Eyes: Conjunctivae and EOM are normal. Pupils are equal, round, and reactive to light. Right eye exhibits no discharge. Left eye exhibits no discharge. No scleral icterus.  Neck: Normal range of motion. Neck supple. No JVD present. No tracheal deviation present. No thyromegaly present.  Cardiovascular: Normal rate, regular rhythm, normal heart sounds and intact distal pulses.  Exam reveals no friction rub.   No murmur heard. Pulmonary/Chest: Effort normal and breath sounds normal. No stridor. No respiratory distress. She has no wheezes. She has no rales. She exhibits no tenderness.  Abdominal: Soft. Bowel sounds are normal. She exhibits no distension. There is no tenderness. There is no rebound and no guarding.  Musculoskeletal: Normal range of motion. She exhibits no edema, tenderness or deformity.  Neurological: She is alert and oriented to person, place, and time. She displays normal reflexes. No cranial nerve deficit. She exhibits normal muscle tone. Coordination normal.  Skin: Skin is warm and dry. No rash noted. She is not diaphoretic. No erythema.  Psychiatric: She has a normal mood and affect. Her behavior is normal. Judgment and thought content normal.    Labs reviewed: Basic Metabolic Panel:  Recent Labs  12/07/15 2047  NA 141  K 3.9  CL 105  GLUCOSE 87  BUN 16  CREATININE 1.00   Liver Function Tests: No results for input(s): AST, ALT, ALKPHOS, BILITOT, PROT, ALBUMIN in the last 8760 hours. No results for input(s): LIPASE, AMYLASE in the last 8760 hours. No results for input(s): AMMONIA in the last 8760 hours. CBC:  Recent Labs  12/07/15 2037 12/07/15 2047  WBC  5.5  --   NEUTROABS 3.2  --   HGB 11.6* 11.2*  HCT 34.6* 33.0*  MCV 92.0  --   PLT 242  --  Cardiac Enzymes: No results for input(s): CKTOTAL, CKMB, CKMBINDEX, TROPONINI in the last 8760 hours. BNP: Invalid input(s): POCBNP No results found for: HGBA1C Lab Results  Component Value Date   TSH 1.89 01/23/2015   Lab Results  Component Value Date   VITAMINB12 588 09/03/2013   No results found for: FOLATE Lab Results  Component Value Date   IRON 86 01/23/2015   FERRITIN 18.6 11/28/2013    Imaging and Procedures obtained prior to SNF admission: Dg Ribs Unilateral W/chest Right  Result Date: 02/13/2016 CLINICAL DATA:  Acute onset of right back and rib pain, after fall. Initial encounter. EXAM: RIGHT RIBS AND CHEST - 3+ VIEW COMPARISON:  Chest radiograph performed 08/25/2011 FINDINGS: No displaced rib fractures are seen. The lungs are well-aerated and clear. There is no evidence of focal opacification, pleural effusion or pneumothorax. The cardiomediastinal silhouette is borderline normal in size. No acute osseous abnormalities are seen. IMPRESSION: No displaced rib fracture seen. No acute cardiopulmonary process identified. Electronically Signed   By: Garald Balding M.D.   On: 02/13/2016 18:47    Assessment/Plan  1. COPD (chronic obstructive pulmonary disease) with chronic bronchitis (HCC) Not on any medication for this problem. Stable.  2. Anxiety state The current medical regimen is effective;  continue present plan and medications.  3. Gastroesophageal reflux disease without esophagitis Asymptomatic. The current medical regimen is effective;  continue present plan and medications.  4. HYPERCHOLESTEROLEMIA The current medical regimen is effective;  continue present plan and medications.

## 2016-04-13 DIAGNOSIS — I10 Essential (primary) hypertension: Secondary | ICD-10-CM | POA: Diagnosis not present

## 2016-04-13 LAB — HEPATIC FUNCTION PANEL
ALT: 12 U/L (ref 7–35)
AST: 16 U/L (ref 13–35)
Alkaline Phosphatase: 91 U/L (ref 25–125)
Bilirubin, Total: 0.4 mg/dL

## 2016-04-13 LAB — BASIC METABOLIC PANEL
BUN: 15 mg/dL (ref 4–21)
CREATININE: 0.8 mg/dL (ref <=1.1)
Glucose: 92 mg/dL
POTASSIUM: 4.2 mmol/L (ref 3.4–5.3)
Sodium: 141 mmol/L (ref 137–147)

## 2016-04-13 LAB — CBC AND DIFFERENTIAL
HCT: 35 % — AB (ref 36–46)
Hemoglobin: 11.5 g/dL — AB (ref 12.0–16.0)
Platelets: 283 10*3/uL (ref 150–399)
WBC: 5.5 10*3/mL

## 2016-04-13 LAB — TSH: TSH: 1.2 u[IU]/mL (ref <=5.90)

## 2016-04-15 DIAGNOSIS — R41841 Cognitive communication deficit: Secondary | ICD-10-CM | POA: Diagnosis not present

## 2016-04-15 DIAGNOSIS — F419 Anxiety disorder, unspecified: Secondary | ICD-10-CM | POA: Diagnosis not present

## 2016-04-15 DIAGNOSIS — H9193 Unspecified hearing loss, bilateral: Secondary | ICD-10-CM | POA: Diagnosis not present

## 2016-04-15 DIAGNOSIS — K5900 Constipation, unspecified: Secondary | ICD-10-CM | POA: Diagnosis not present

## 2016-04-15 DIAGNOSIS — M6281 Muscle weakness (generalized): Secondary | ICD-10-CM | POA: Diagnosis not present

## 2016-04-15 DIAGNOSIS — M810 Age-related osteoporosis without current pathological fracture: Secondary | ICD-10-CM | POA: Diagnosis not present

## 2016-04-15 DIAGNOSIS — D649 Anemia, unspecified: Secondary | ICD-10-CM | POA: Diagnosis not present

## 2016-04-15 DIAGNOSIS — K219 Gastro-esophageal reflux disease without esophagitis: Secondary | ICD-10-CM | POA: Diagnosis not present

## 2016-04-15 DIAGNOSIS — R1312 Dysphagia, oropharyngeal phase: Secondary | ICD-10-CM | POA: Diagnosis not present

## 2016-04-15 DIAGNOSIS — E7800 Pure hypercholesterolemia, unspecified: Secondary | ICD-10-CM | POA: Diagnosis not present

## 2016-04-15 DIAGNOSIS — R2681 Unsteadiness on feet: Secondary | ICD-10-CM | POA: Diagnosis not present

## 2016-04-16 DIAGNOSIS — R1312 Dysphagia, oropharyngeal phase: Secondary | ICD-10-CM | POA: Diagnosis not present

## 2016-04-16 DIAGNOSIS — M6281 Muscle weakness (generalized): Secondary | ICD-10-CM | POA: Diagnosis not present

## 2016-04-16 DIAGNOSIS — R2681 Unsteadiness on feet: Secondary | ICD-10-CM | POA: Diagnosis not present

## 2016-04-16 DIAGNOSIS — R41841 Cognitive communication deficit: Secondary | ICD-10-CM | POA: Diagnosis not present

## 2016-04-16 DIAGNOSIS — D649 Anemia, unspecified: Secondary | ICD-10-CM | POA: Diagnosis not present

## 2016-04-16 DIAGNOSIS — E7800 Pure hypercholesterolemia, unspecified: Secondary | ICD-10-CM | POA: Diagnosis not present

## 2016-04-20 DIAGNOSIS — R41841 Cognitive communication deficit: Secondary | ICD-10-CM | POA: Diagnosis not present

## 2016-04-20 DIAGNOSIS — R1312 Dysphagia, oropharyngeal phase: Secondary | ICD-10-CM | POA: Diagnosis not present

## 2016-04-20 DIAGNOSIS — E7800 Pure hypercholesterolemia, unspecified: Secondary | ICD-10-CM | POA: Diagnosis not present

## 2016-04-20 DIAGNOSIS — R2681 Unsteadiness on feet: Secondary | ICD-10-CM | POA: Diagnosis not present

## 2016-04-20 DIAGNOSIS — M6281 Muscle weakness (generalized): Secondary | ICD-10-CM | POA: Diagnosis not present

## 2016-04-20 DIAGNOSIS — D649 Anemia, unspecified: Secondary | ICD-10-CM | POA: Diagnosis not present

## 2016-04-22 ENCOUNTER — Encounter: Payer: Self-pay | Admitting: Internal Medicine

## 2016-04-22 DIAGNOSIS — E7800 Pure hypercholesterolemia, unspecified: Secondary | ICD-10-CM | POA: Diagnosis not present

## 2016-04-22 DIAGNOSIS — D649 Anemia, unspecified: Secondary | ICD-10-CM | POA: Diagnosis not present

## 2016-04-22 DIAGNOSIS — R2681 Unsteadiness on feet: Secondary | ICD-10-CM | POA: Diagnosis not present

## 2016-04-22 DIAGNOSIS — M6281 Muscle weakness (generalized): Secondary | ICD-10-CM | POA: Diagnosis not present

## 2016-04-22 DIAGNOSIS — R1312 Dysphagia, oropharyngeal phase: Secondary | ICD-10-CM | POA: Diagnosis not present

## 2016-04-22 DIAGNOSIS — R41841 Cognitive communication deficit: Secondary | ICD-10-CM | POA: Diagnosis not present

## 2016-04-23 DIAGNOSIS — R41841 Cognitive communication deficit: Secondary | ICD-10-CM | POA: Diagnosis not present

## 2016-04-23 DIAGNOSIS — E7800 Pure hypercholesterolemia, unspecified: Secondary | ICD-10-CM | POA: Diagnosis not present

## 2016-04-23 DIAGNOSIS — D649 Anemia, unspecified: Secondary | ICD-10-CM | POA: Diagnosis not present

## 2016-04-23 DIAGNOSIS — R1312 Dysphagia, oropharyngeal phase: Secondary | ICD-10-CM | POA: Diagnosis not present

## 2016-04-23 DIAGNOSIS — M6281 Muscle weakness (generalized): Secondary | ICD-10-CM | POA: Diagnosis not present

## 2016-04-23 DIAGNOSIS — R2681 Unsteadiness on feet: Secondary | ICD-10-CM | POA: Diagnosis not present

## 2016-04-27 DIAGNOSIS — D649 Anemia, unspecified: Secondary | ICD-10-CM | POA: Diagnosis not present

## 2016-04-27 DIAGNOSIS — E7800 Pure hypercholesterolemia, unspecified: Secondary | ICD-10-CM | POA: Diagnosis not present

## 2016-04-27 DIAGNOSIS — R41841 Cognitive communication deficit: Secondary | ICD-10-CM | POA: Diagnosis not present

## 2016-04-27 DIAGNOSIS — M6281 Muscle weakness (generalized): Secondary | ICD-10-CM | POA: Diagnosis not present

## 2016-04-27 DIAGNOSIS — R2681 Unsteadiness on feet: Secondary | ICD-10-CM | POA: Diagnosis not present

## 2016-04-27 DIAGNOSIS — R1312 Dysphagia, oropharyngeal phase: Secondary | ICD-10-CM | POA: Diagnosis not present

## 2016-04-29 DIAGNOSIS — R1312 Dysphagia, oropharyngeal phase: Secondary | ICD-10-CM | POA: Diagnosis not present

## 2016-04-29 DIAGNOSIS — R2681 Unsteadiness on feet: Secondary | ICD-10-CM | POA: Diagnosis not present

## 2016-04-29 DIAGNOSIS — M6281 Muscle weakness (generalized): Secondary | ICD-10-CM | POA: Diagnosis not present

## 2016-04-29 DIAGNOSIS — E7800 Pure hypercholesterolemia, unspecified: Secondary | ICD-10-CM | POA: Diagnosis not present

## 2016-04-29 DIAGNOSIS — D649 Anemia, unspecified: Secondary | ICD-10-CM | POA: Diagnosis not present

## 2016-04-29 DIAGNOSIS — R41841 Cognitive communication deficit: Secondary | ICD-10-CM | POA: Diagnosis not present

## 2016-05-03 DIAGNOSIS — E7800 Pure hypercholesterolemia, unspecified: Secondary | ICD-10-CM | POA: Diagnosis not present

## 2016-05-03 DIAGNOSIS — D649 Anemia, unspecified: Secondary | ICD-10-CM | POA: Diagnosis not present

## 2016-05-03 DIAGNOSIS — M6281 Muscle weakness (generalized): Secondary | ICD-10-CM | POA: Diagnosis not present

## 2016-05-03 DIAGNOSIS — R41841 Cognitive communication deficit: Secondary | ICD-10-CM | POA: Diagnosis not present

## 2016-05-03 DIAGNOSIS — Z9889 Other specified postprocedural states: Secondary | ICD-10-CM | POA: Diagnosis not present

## 2016-05-03 DIAGNOSIS — I770 Arteriovenous fistula, acquired: Secondary | ICD-10-CM | POA: Diagnosis not present

## 2016-05-03 DIAGNOSIS — R2681 Unsteadiness on feet: Secondary | ICD-10-CM | POA: Diagnosis not present

## 2016-05-03 DIAGNOSIS — Q2730 Arteriovenous malformation, site unspecified: Secondary | ICD-10-CM | POA: Diagnosis not present

## 2016-05-03 DIAGNOSIS — R1312 Dysphagia, oropharyngeal phase: Secondary | ICD-10-CM | POA: Diagnosis not present

## 2016-05-03 DIAGNOSIS — H0520 Unspecified exophthalmos: Secondary | ICD-10-CM | POA: Diagnosis not present

## 2016-05-04 DIAGNOSIS — E7800 Pure hypercholesterolemia, unspecified: Secondary | ICD-10-CM | POA: Diagnosis not present

## 2016-05-04 DIAGNOSIS — D649 Anemia, unspecified: Secondary | ICD-10-CM | POA: Diagnosis not present

## 2016-05-04 DIAGNOSIS — R1312 Dysphagia, oropharyngeal phase: Secondary | ICD-10-CM | POA: Diagnosis not present

## 2016-05-04 DIAGNOSIS — Z09 Encounter for follow-up examination after completed treatment for conditions other than malignant neoplasm: Secondary | ICD-10-CM | POA: Diagnosis not present

## 2016-05-04 DIAGNOSIS — R2681 Unsteadiness on feet: Secondary | ICD-10-CM | POA: Diagnosis not present

## 2016-05-04 DIAGNOSIS — R41841 Cognitive communication deficit: Secondary | ICD-10-CM | POA: Diagnosis not present

## 2016-05-04 DIAGNOSIS — M6281 Muscle weakness (generalized): Secondary | ICD-10-CM | POA: Diagnosis not present

## 2016-05-05 DIAGNOSIS — M6281 Muscle weakness (generalized): Secondary | ICD-10-CM | POA: Diagnosis not present

## 2016-05-05 DIAGNOSIS — R1312 Dysphagia, oropharyngeal phase: Secondary | ICD-10-CM | POA: Diagnosis not present

## 2016-05-05 DIAGNOSIS — D649 Anemia, unspecified: Secondary | ICD-10-CM | POA: Diagnosis not present

## 2016-05-05 DIAGNOSIS — E7800 Pure hypercholesterolemia, unspecified: Secondary | ICD-10-CM | POA: Diagnosis not present

## 2016-05-05 DIAGNOSIS — R2681 Unsteadiness on feet: Secondary | ICD-10-CM | POA: Diagnosis not present

## 2016-05-05 DIAGNOSIS — R41841 Cognitive communication deficit: Secondary | ICD-10-CM | POA: Diagnosis not present

## 2016-05-12 ENCOUNTER — Encounter: Payer: Self-pay | Admitting: Nurse Practitioner

## 2016-05-12 ENCOUNTER — Non-Acute Institutional Stay: Payer: Medicare Other | Admitting: Nurse Practitioner

## 2016-05-12 DIAGNOSIS — J449 Chronic obstructive pulmonary disease, unspecified: Secondary | ICD-10-CM

## 2016-05-12 DIAGNOSIS — M797 Fibromyalgia: Secondary | ICD-10-CM | POA: Diagnosis not present

## 2016-05-12 DIAGNOSIS — J4489 Other specified chronic obstructive pulmonary disease: Secondary | ICD-10-CM

## 2016-05-12 DIAGNOSIS — K5901 Slow transit constipation: Secondary | ICD-10-CM | POA: Diagnosis not present

## 2016-05-12 DIAGNOSIS — L600 Ingrowing nail: Secondary | ICD-10-CM | POA: Diagnosis not present

## 2016-05-12 DIAGNOSIS — F411 Generalized anxiety disorder: Secondary | ICD-10-CM

## 2016-05-12 DIAGNOSIS — D62 Acute posthemorrhagic anemia: Secondary | ICD-10-CM | POA: Diagnosis not present

## 2016-05-12 DIAGNOSIS — K219 Gastro-esophageal reflux disease without esophagitis: Secondary | ICD-10-CM | POA: Diagnosis not present

## 2016-05-12 NOTE — Assessment & Plan Note (Signed)
Stable

## 2016-05-12 NOTE — Assessment & Plan Note (Signed)
Stable, continue Famotidine.  

## 2016-05-12 NOTE — Assessment & Plan Note (Signed)
Stable, continue Imipramine

## 2016-05-12 NOTE — Assessment & Plan Note (Signed)
Healed

## 2016-05-12 NOTE — Assessment & Plan Note (Signed)
Imipramine helps

## 2016-05-12 NOTE — Assessment & Plan Note (Signed)
Stable, diet controlled, MiraLax daily

## 2016-05-12 NOTE — Progress Notes (Signed)
Provider:  Marlana Latus NP  Location:   [facility] Room Number: 585 Place of Service:  ALF (13)  PCP: Jeanmarie Hubert, MD Patient Care Team: Estill Dooms, MD as PCP - General (Internal Medicine) Jayin Derousse Otho Darner, NP as Nurse Practitioner (Internal Medicine)  Extended Emergency Contact Information Primary Emergency Contact: Culbreth,Robin Address: [address] [place], [place] [address] [place] Phone: [phone] Mobile Phone: [phone] Relation: Daughter  Code Status: DNR Goals of Care: Advanced Directive information Advanced Directives 05/12/2016  Does Patient Have a Medical Advance Directive? Yes  Type of Advance Directive Living will  Does patient want to make changes to medical advance directive? No - Patient declined  Copy of [place] in Chart? Yes  Would patient like information on creating a medical advance directive? -  Pre-existing out of facility DNR order (yellow form or pink MOST form) -      Chief Complaint  Patient presents with  . Medical Management of Chronic Issues    HPI: Patient is a 83 y.o. female seen today for evaluation of chronic medical conditions  Hx of GERD, stable on Famotidine 20mg  qd, mood is stable on Imipramine 25mg . She takes MiraLax daily for constipation.   Past Medical History:  Diagnosis Date  . Acute blood loss anemia 08/23/2013   04/13/16 TSH 1.20, Na 141, K 4.2, Bun 15, creat 0.79, wbc 5.5, Hgb 11.5, plt 283  . Anxiety   . Anxiety state 07/02/2007   Qualifier: Diagnosis of  By: Lenna Gilford MD, Deborra Medina   . 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 Cigarette smoker   . Colon polyps 2009   TWO TUBULAR ADENOMAS AND HYPERPLASTIC POLYPS  . Constipation 09/14/2013  . COPD (chronic obstructive pulmonary disease) ([place])   . Diverticulosis of colon   . DJD (degenerative joint disease)   . Dog bite of limb 07/14/2010   Dog bite 07/10/10 - dogs shots utd Last td was 08/2009   Localized tx only.    . Fibromyalgia   . GERD 12/19/2006   Qualifier: Diagnosis of  By: Julien Girt CMA, Marliss Czar  04/13/16 TSH 1.20, Na 141, K 4.2, Bun 15, creat 0.79, wbc 5.5, Hgb 11.5, plt 283   . GERD (gastroesophageal reflux disease)   . Hammer toe of second toe of left foot 04/08/2016   Left 2nd  . Hearing loss   . Hemorrhoids   . Hypercholesterolemia   . Osteoarthritis 12/19/2006   Qualifier: Diagnosis of  By: Julien Girt CMA, Marliss Czar    . Osteoporosis    Past Surgical History:  Procedure Laterality Date  . CATARACT EXTRACTION, BILATERAL  2009  . COLONOSCOPY  2009, 2006 , 2017  . stapes surgery     Dr Thornell Mule  . TONSILLECTOMY AND ADENOIDECTOMY     as a child    reports that she quit smoking about 6 years ago. Her smoking use included Cigarettes. She has never used smokeless tobacco. She reports that she drinks alcohol. She reports that she does not use drugs. Social History   Social History  . Marital status: Widowed    Spouse name: N/A  . Number of children: N/A  . Years of education: N/A   Occupational History  . retired   . works some weekends at [employer] Topics  . Smoking status: Former Smoker    Types: Cigarettes    Quit  date: 02/15/2010  . Smokeless tobacco: Never Used  . Alcohol use Yes     Comment: occasional glass of wine  . Drug use: No  . Sexual activity: No   Other Topics Concern  . Not on file   Social History Narrative   Pt works some weekends at [employer]   Caffeine use - daily coffee in the mornings   Patient gets regular exercise > tries to walk daily for exercise   Moved to [place] 03/11/16   Widowed   Former Smoker-stopped 2012   Alcohol occasionally glass of wine   Living Will       Functional Status Survey:    Family History  Problem Relation Age of Onset  . Heart disease Father   . COPD Father   . Hypertension Mother   . Arthritis Mother     Health Maintenance  Topic Date Due  . DEXA  SCAN  09/28/1998  . INFLUENZA VACCINE  05/12/2016 (Originally 09/16/2015)  . PNA vac Low Risk Adult (2 of 2 - PPSV23) 08/31/2016  . TETANUS/TDAP  08/22/2019    Allergies  Allergen Reactions  . Penicillins Anaphylaxis  . Bisphosphonates Other (See Comments)    [name]  . Influenza Vaccines     Pt reported  . Simvastatin     Unknown   . Trazodone And Nefazodone     Felt her throat was closing up/kept her awake  . Sulfonamide Derivatives Rash and Other (See Comments)    blisters    Allergies as of 05/12/2016      Reactions   Penicillins Anaphylaxis   Bisphosphonates Other (See Comments)   [name]   Influenza Vaccines    Pt reported   Simvastatin    Unknown    Trazodone And Nefazodone    Felt her throat was closing up/kept her awake   Sulfonamide Derivatives Rash, Other (See Comments)   blisters      Medication List       Accurate as of 05/12/16  5:24 PM. Always use your most recent med list.          aspirin 81 MG tablet Take 81 mg by mouth daily.   CALCIUM 600+D PLUS MINERALS 600-400 MG-UNIT Tabs Take 1 tablet by mouth daily.   calcium carbonate 750 MG chewable tablet Commonly known as:  TUMS EX Chew 750 mg by mouth daily.   cholecalciferol 1000 units tablet Commonly known as:  VITAMIN D Take 1,000 Units by mouth daily.   famotidine 20 MG tablet Commonly known as:  PEPCID TAKE ONE TABLET BY MOUTH ONCE DAILY   imipramine 25 MG tablet Commonly known as:  TOFRANIL TAKE TWO TABLETS BY MOUTH ONCE DAILY   polyethylene glycol packet Commonly known as:  MIRALAX / GLYCOLAX Take 17 g by mouth. Give 17 gm in 4 oz of liquid daily as needed       Review of Systems  Constitutional: Negative for activity change, appetite change, chills, diaphoresis, fatigue, fever and unexpected weight change.  HENT: Positive for hearing loss. Negative for congestion, dental problem, drooling, ear discharge, ear pain, facial swelling, mouth sores, nosebleeds,  postnasal drip, sinus pain, sinus pressure, sneezing, sore throat, tinnitus, trouble swallowing and voice change.        Hearing aid right   Eyes: Negative for photophobia, pain, discharge, redness, itching and visual disturbance.  Respiratory: Negative for apnea, cough, choking, chest tightness, shortness of breath, wheezing and stridor.   Cardiovascular:  Negative for chest pain, palpitations and leg swelling.  Gastrointestinal: Positive for constipation. Negative for abdominal distention, abdominal pain, blood in stool, diarrhea, nausea and rectal pain.  Endocrine: Negative for heat intolerance, polydipsia, polyphagia and polyuria.  Genitourinary: Negative for difficulty urinating, dyspareunia, dysuria, enuresis, flank pain, frequency, hematuria and urgency.  Musculoskeletal: Positive for arthralgias, gait problem and myalgias. Negative for back pain, joint swelling, neck pain and neck stiffness.       Walker, falls  Skin: Negative for color change, pallor and wound.       The left great toe ingrown toenail, left 2nd hammer toe  Allergic/Immunologic: Negative for environmental allergies, food allergies and immunocompromised state.  Neurological: Negative for dizziness, tremors, seizures, syncope, facial asymmetry, speech difficulty, weakness, light-headedness, numbness and headaches.  Hematological: Negative for adenopathy. Does not bruise/bleed easily.  Psychiatric/Behavioral: Positive for sleep disturbance. Negative for agitation, behavioral problems, confusion, decreased concentration, dysphoric mood and hallucinations. The patient is nervous/anxious. The patient is not hyperactive.        Cried during today's visit when she mentioned her mother.     Vitals:   05/12/16 1145  BP: 136/74  Pulse: 72  Resp: 18  Temp: 97 F (36.1 C)  Weight: 131 lb 3.2 oz (59.5 kg)  Height: 5\' 2"  (1.575 m)   Body mass index is 24 kg/m. Physical Exam  Constitutional: She is oriented to person, place,  and time. She appears well-developed and well-nourished.  HENT:  Head: Normocephalic and atraumatic.  Right Ear: External ear normal.  Left Ear: External ear normal.  Nose: Nose normal.  Mouth/Throat: Oropharynx is clear and moist.  Eyes: Conjunctivae and EOM are normal. Pupils are equal, round, and reactive to light. Right eye exhibits no discharge. Left eye exhibits no discharge. No scleral icterus.  Neck: Normal range of motion. Neck supple. No JVD present. No tracheal deviation present. No thyromegaly present.  Cardiovascular: Normal rate, regular rhythm, normal heart sounds and intact distal pulses.  Exam reveals no friction rub.   No murmur heard. Pulmonary/Chest: Effort normal and breath sounds normal. No stridor. No respiratory distress. She has no wheezes. She has no rales. She exhibits no tenderness.  Abdominal: Soft. Bowel sounds are normal. She exhibits no distension. There is no tenderness. There is no rebound and no guarding.  Musculoskeletal: Normal range of motion. She exhibits no edema, tenderness or deformity.  The left 2nd hammer toe  Neurological: She is alert and oriented to person, place, and time. She displays normal reflexes. No cranial nerve deficit. She exhibits normal muscle tone. Coordination normal.  Skin: Skin is warm and dry. No rash noted. She is not diaphoretic. No erythema.  Psychiatric: She has a normal mood and affect. Her behavior is normal. Judgment and thought content normal.    Labs reviewed: Basic Metabolic Panel:  Recent Labs  12/07/15 2047  NA 141  K 3.9  CL 105  GLUCOSE 87  BUN 16  CREATININE 1.00   Liver Function Tests: No results for input(s): AST, ALT, ALKPHOS, BILITOT, PROT, ALBUMIN in the last 8760 hours. No results for input(s): LIPASE, AMYLASE in the last 8760 hours. No results for input(s): AMMONIA in the last 8760 hours. CBC:  Recent Labs  12/07/15 2037 12/07/15 2047  WBC 5.5  --   NEUTROABS 3.2  --   HGB 11.6* 11.2*    HCT 34.6* 33.0*  MCV 92.0  --   PLT 242  --    Cardiac Enzymes: No results for input(s): CKTOTAL,  CKMB, CKMBINDEX, TROPONINI in the last 8760 hours. BNP: Invalid input(s): POCBNP No results found for: HGBA1C Lab Results  Component Value Date   TSH 1.89 01/23/2015   Lab Results  Component Value Date   VITAMINB12 588 09/03/2013   No results found for: FOLATE Lab Results  Component Value Date   IRON 86 01/23/2015   FERRITIN 18.6 11/28/2013    Imaging and Procedures obtained prior to SNF admission: Dg Ribs Unilateral W/chest Right  Result Date: 02/13/2016 CLINICAL DATA:  Acute onset of right back and rib pain, after fall. Initial encounter. EXAM: RIGHT RIBS AND CHEST - 3+ VIEW COMPARISON:  Chest radiograph performed 08/25/2011 FINDINGS: No displaced rib fractures are seen. The lungs are well-aerated and clear. There is no evidence of focal opacification, pleural effusion or pneumothorax. The cardiomediastinal silhouette is borderline normal in size. No acute osseous abnormalities are seen. IMPRESSION: No displaced rib fracture seen. No acute cardiopulmonary process identified. Electronically Signed   By: Garald Balding M.D.   On: 02/13/2016 18:47    Assessment/Plan Acute blood loss anemia 04/13/16 TSH 1.20, Na 141, K 4.2, Bun 15, creat 0.79, wbc 5.5, Hgb 11.5, plt 283  Fibromyalgia Imipramine helps  Anxiety state Stable, continue Imipramine  Ingrown toenail without infection Healed.   Constipation Stable, diet controlled, MiraLax daily  GERD Stable, continue Famotidine.   COPD (chronic obstructive pulmonary disease) with chronic bronchitis (HCC) Stable.    Family/ staff Communication: AL  Labs/tests ordered: MMSE pending.

## 2016-05-12 NOTE — Assessment & Plan Note (Signed)
04/13/16 TSH 1.20, Na 141, K 4.2, Bun 15, creat 0.79, wbc 5.5, Hgb 11.5, plt 283

## 2016-05-17 ENCOUNTER — Encounter: Payer: Self-pay | Admitting: *Deleted

## 2016-05-28 ENCOUNTER — Other Ambulatory Visit: Payer: Self-pay | Admitting: *Deleted

## 2016-07-21 ENCOUNTER — Non-Acute Institutional Stay: Payer: Medicare Other | Admitting: Nurse Practitioner

## 2016-07-21 ENCOUNTER — Encounter: Payer: Self-pay | Admitting: Nurse Practitioner

## 2016-07-21 DIAGNOSIS — K219 Gastro-esophageal reflux disease without esophagitis: Secondary | ICD-10-CM | POA: Diagnosis not present

## 2016-07-21 DIAGNOSIS — F411 Generalized anxiety disorder: Secondary | ICD-10-CM | POA: Diagnosis not present

## 2016-07-21 DIAGNOSIS — D62 Acute posthemorrhagic anemia: Secondary | ICD-10-CM

## 2016-07-21 DIAGNOSIS — J449 Chronic obstructive pulmonary disease, unspecified: Secondary | ICD-10-CM

## 2016-07-21 DIAGNOSIS — R21 Rash and other nonspecific skin eruption: Secondary | ICD-10-CM | POA: Diagnosis not present

## 2016-07-21 DIAGNOSIS — K5901 Slow transit constipation: Secondary | ICD-10-CM | POA: Diagnosis not present

## 2016-07-21 NOTE — Assessment & Plan Note (Addendum)
Not able to fall and maintain asleep at night, failed Melatonin, will try Lorazepam 0.25mg  qhs x 7 days, then re-eval,  continue Imipramine

## 2016-07-21 NOTE — Assessment & Plan Note (Addendum)
Not well controlled on MiraLax prn, will try MiraLax qod

## 2016-07-21 NOTE — Assessment & Plan Note (Signed)
Last Hgb 11.5 04/13/16, update CBC

## 2016-07-21 NOTE — Assessment & Plan Note (Signed)
C/o itching area at R+L wrists, a few scattered red spots w/o s/s of infection, duration and etiology uncertain. Try 1% Hydrocortisone cream bid prn, observe.

## 2016-07-21 NOTE — Assessment & Plan Note (Signed)
Stable, continue Famotidine. Update CBC CMP

## 2016-07-21 NOTE — Assessment & Plan Note (Signed)
Stable

## 2016-07-21 NOTE — Progress Notes (Signed)
Location:  [facility] Room Number: 852 Place of Service:  ALF [phone]) Provider:  Mast, Manxie  NP  Estill Dooms, MD  Patient Care Team: Estill Dooms, MD as PCP - General (Internal Medicine) Mast, Man X, NP as Nurse Practitioner (Internal Medicine)  Extended Emergency Contact Information Primary Emergency Contact: Culbreth,Robin Address: [address] [place], [place] [address] [place] Phone: [phone] Mobile Phone: [phone] Relation: Daughter  Code Status:  Full Code Goals of care: Advanced Directive information Advanced Directives 07/21/2016  Does Patient Have a Medical Advance Directive? Yes  Type of Advance Directive Living will  Does patient want to make changes to medical advance directive? No - Patient declined  Copy of [place] in Chart? -  Would patient like information on creating a medical advance directive? -  Pre-existing out of facility DNR order (yellow form or pink MOST form) -     Chief Complaint  Patient presents with  . Medical Management of Chronic Issues    HPI:  Pt is a 83 y.o. female seen today for medical management of chronic diseases.     Past Medical History:  Diagnosis Date  . Acute blood loss anemia 08/23/2013   04/13/16 TSH 1.20, Na 141, K 4.2, Bun 15, creat 0.79, wbc 5.5, Hgb 11.5, plt 283  . Anxiety   . Anxiety state 07/02/2007   Qualifier: Diagnosis of  By: Lenna Gilford MD, Deborra Medina   . 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 Cigarette smoker   . Colon polyps 2009   TWO TUBULAR ADENOMAS AND HYPERPLASTIC POLYPS  . Constipation 09/14/2013  . COPD (chronic obstructive pulmonary disease) ([place])   . Diverticulosis of colon   . DJD (degenerative joint disease)   . Dog bite of limb 07/14/2010   Dog bite 07/10/10 - dogs shots utd Last td was 08/2009  Localized tx only.    . Fibromyalgia   . GERD 12/19/2006   Qualifier: Diagnosis of  By: Julien Girt CMA,  Marliss Czar  04/13/16 TSH 1.20, Na 141, K 4.2, Bun 15, creat 0.79, wbc 5.5, Hgb 11.5, plt 283   . GERD (gastroesophageal reflux disease)   . Hammer toe of second toe of left foot 04/08/2016   Left 2nd  . Hearing loss   . Hemorrhoids   . Hypercholesterolemia   . Osteoarthritis 12/19/2006   Qualifier: Diagnosis of  By: Julien Girt CMA, Marliss Czar    . Osteoporosis    Past Surgical History:  Procedure Laterality Date  . CATARACT EXTRACTION, BILATERAL  2009  . COLONOSCOPY  2009, 2006 , 2017  . stapes surgery     Dr Thornell Mule  . TONSILLECTOMY AND ADENOIDECTOMY     as a child    Allergies  Allergen Reactions  . Penicillins Anaphylaxis  . Bisphosphonates Other (See Comments)    [name]  . Influenza Vaccines     Pt reported  . Simvastatin     Unknown   . Trazodone And Nefazodone     Felt her throat was closing up/kept her awake  . Sulfonamide Derivatives Rash and Other (See Comments)    blisters    Outpatient Encounter Prescriptions as of 07/21/2016  Medication Sig  . aspirin 81 MG tablet Take 81 mg by mouth daily.  . calcium carbonate (TUMS EX) 750 MG chewable tablet Chew 750 mg by mouth daily.   . Calcium Carbonate-Vit D-Min (CALCIUM 600+D  PLUS MINERALS) 600-400 MG-UNIT TABS Take 1 tablet by mouth daily.   . cholecalciferol (VITAMIN D) 1000 UNITS tablet Take 1,000 Units by mouth daily.  . famotidine (PEPCID) 20 MG tablet TAKE ONE TABLET BY MOUTH ONCE DAILY  . imipramine (TOFRANIL) 25 MG tablet TAKE TWO TABLETS BY MOUTH ONCE DAILY  . Melatonin 3 MG TABS Take 1 tablet by mouth at bedtime.  . polyethylene glycol (MIRALAX / GLYCOLAX) packet Take 17 g by mouth. Give 17 gm in 4 oz of liquid daily as needed   No facility-administered encounter medications on file as of 07/21/2016.     Review of Systems  Immunization History  Administered Date(s) Administered  . Pneumococcal Conjugate-13 09/01/2015  . Pneumococcal Polysaccharide-23 07/07/1998  . Td 08/21/2009   Pertinent  Health Maintenance  Due  Topic Date Due  . DEXA SCAN  09/28/1998  . PNA vac Low Risk Adult (2 of 2 - PPSV23) 08/31/2016  . INFLUENZA VACCINE  09/15/2016   Fall Risk  09/01/2015 08/31/2012  Falls in the past year? Yes No  Number falls in past yr: 1 -  Injury with Fall? No -  Risk for fall due to : Impaired balance/gait -   Functional Status Survey:    Vitals:   07/21/16 1203  BP: 130/80  Pulse: 76  Resp: 20  Temp: 97.4 F (36.3 C)  Weight: 136 lb (61.7 kg)  Height: 5\' 2"  (1.575 m)   Body mass index is 24.87 kg/m. Physical Exam  Labs reviewed:  Recent Labs  12/07/15 2047 04/13/16  NA 141 141  K 3.9 4.2  CL 105  --   GLUCOSE 87  --   BUN 16 15  CREATININE 1.00 0.8    Recent Labs  04/13/16  AST 16  ALT 12  ALKPHOS 91    Recent Labs  12/07/15 2037 12/07/15 2047 04/13/16  WBC 5.5  --  5.5  NEUTROABS 3.2  --   --   HGB 11.6* 11.2* 11.5*  HCT 34.6* 33.0* 35*  MCV 92.0  --   --   PLT 242  --  283   Lab Results  Component Value Date   TSH 1.20 04/13/2016   No results found for: HGBA1C Lab Results  Component Value Date   CHOL 186 01/23/2015   HDL 100.20 01/23/2015   LDLCALC 66 01/23/2015   LDLDIRECT 114.7 08/18/2011   TRIG 101.0 01/23/2015   CHOLHDL 2 01/23/2015    Significant Diagnostic Results in last 30 days:  No results found.  Assessment/Plan 1. COPD (chronic obstructive pulmonary disease) with chronic bronchitis ([place])     Family/ staff Communication:   Labs/tests ordered:

## 2016-07-21 NOTE — Progress Notes (Signed)
Provider:  Marlana Latus NP  Location:   [facility] Room Number: 161 Place of Service:  ALF (13)  PCP: Estill Dooms, MD Patient Care Team: Estill Dooms, MD as PCP - General (Internal Medicine) Laura Radilla X, NP as Nurse Practitioner (Internal Medicine)  Extended Emergency Contact Information Primary Emergency Contact: Culbreth,Robin Address: [address] [place], [place] [address] [place] Phone: [phone] Mobile Phone: [phone] Relation: Daughter  Code Status: DNR Goals of Care: Advanced Directive information Advanced Directives 07/21/2016  Does Patient Have a Medical Advance Directive? Yes  Type of Advance Directive Living will  Does patient want to make changes to medical advance directive? No - Patient declined  Copy of [place] in Chart? -  Would patient like information on creating a medical advance directive? -  Pre-existing out of facility DNR order (yellow form or pink MOST form) -      Chief Complaint  Patient presents with  . Medical Management of Chronic Issues    HPI: Patient is a 83 y.o. female seen today for evaluation of chronic medical conditions  C/o itching area at R+L wrists, a few scattered red spots w/o s/s of infection, duration and etiology uncertain  C/o not able to fall and maintain asleep at night, currently taking Melatonin.   Hx of GERD, stable on Famotidine 20mg  qd, mood is stable on Imipramine 25mg . She takes MiraLax qod  for constipation.   Past Medical History:  Diagnosis Date  . Acute blood loss anemia 08/23/2013   04/13/16 TSH 1.20, Na 141, K 4.2, Bun 15, creat 0.79, wbc 5.5, Hgb 11.5, plt 283  . Anxiety   . Anxiety state 07/02/2007   Qualifier: Diagnosis of  By: Lenna Gilford MD, Deborra Medina   . 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 Cigarette smoker   . Colon polyps 2009   TWO TUBULAR ADENOMAS AND HYPERPLASTIC POLYPS  . Constipation 09/14/2013  . COPD  (chronic obstructive pulmonary disease) ([place])   . Diverticulosis of colon   . DJD (degenerative joint disease)   . Dog bite of limb 07/14/2010   Dog bite 07/10/10 - dogs shots utd Last td was 08/2009  Localized tx only.    . Fibromyalgia   . GERD 12/19/2006   Qualifier: Diagnosis of  By: Julien Girt CMA, Marliss Czar  04/13/16 TSH 1.20, Na 141, K 4.2, Bun 15, creat 0.79, wbc 5.5, Hgb 11.5, plt 283   . GERD (gastroesophageal reflux disease)   . Hammer toe of second toe of left foot 04/08/2016   Left 2nd  . Hearing loss   . Hemorrhoids   . Hypercholesterolemia   . Osteoarthritis 12/19/2006   Qualifier: Diagnosis of  By: Julien Girt CMA, Marliss Czar    . Osteoporosis    Past Surgical History:  Procedure Laterality Date  . CATARACT EXTRACTION, BILATERAL  2009  . COLONOSCOPY  2009, 2006 , 2017  . stapes surgery     Dr Thornell Mule  . TONSILLECTOMY AND ADENOIDECTOMY     as a child    reports that she quit smoking about 6 years ago. Her smoking use included Cigarettes. She has never used smokeless tobacco. She reports that she drinks alcohol. She reports that she does not use drugs. Social History   Social History  . Marital status: Widowed    Spouse name: N/A  . Number of children: N/A  . Years of education: N/A  Occupational History  . retired   . works some weekends at [employer] Topics  . Smoking status: Former Smoker    Types: Cigarettes    Quit date: 02/15/2010  . Smokeless tobacco: Never Used  . Alcohol use Yes     Comment: occasional glass of wine  . Drug use: No  . Sexual activity: No   Other Topics Concern  . Not on file   Social History Narrative   Pt works some weekends at [employer]   Caffeine use - daily coffee in the mornings   Patient gets regular exercise > tries to walk daily for exercise   Moved to [place] 03/11/16   Widowed   Former Smoker-stopped 2012   Alcohol occasionally glass of wine   Living Will       Functional Status  Survey:    Family History  Problem Relation Age of Onset  . Heart disease Father   . COPD Father   . Hypertension Mother   . Arthritis Mother     Health Maintenance  Topic Date Due  . DEXA SCAN  09/28/1998  . PNA vac Low Risk Adult (2 of 2 - PPSV23) 08/31/2016  . INFLUENZA VACCINE  09/15/2016  . TETANUS/TDAP  08/22/2019    Allergies  Allergen Reactions  . Penicillins Anaphylaxis  . Bisphosphonates Other (See Comments)    [name]  . Influenza Vaccines     Pt reported  . Simvastatin     Unknown   . Trazodone And Nefazodone     Felt her throat was closing up/kept her awake  . Sulfonamide Derivatives Rash and Other (See Comments)    blisters    Allergies as of 07/21/2016      Reactions   Penicillins Anaphylaxis   Bisphosphonates Other (See Comments)   [name]   Influenza Vaccines    Pt reported   Simvastatin    Unknown    Trazodone And Nefazodone    Felt her throat was closing up/kept her awake   Sulfonamide Derivatives Rash, Other (See Comments)   blisters      Medication List       Accurate as of 07/21/16  5:24 PM. Always use your most recent med list.          aspirin 81 MG tablet Take 81 mg by mouth daily.   CALCIUM 600+D PLUS MINERALS 600-400 MG-UNIT Tabs Take 1 tablet by mouth daily.   calcium carbonate 750 MG chewable tablet Commonly known as:  TUMS EX Chew 750 mg by mouth daily.   cholecalciferol 1000 units tablet Commonly known as:  VITAMIN D Take 1,000 Units by mouth daily.   famotidine 20 MG tablet Commonly known as:  PEPCID TAKE ONE TABLET BY MOUTH ONCE DAILY   imipramine 25 MG tablet Commonly known as:  TOFRANIL TAKE TWO TABLETS BY MOUTH ONCE DAILY   Melatonin 3 MG Tabs Take 1 tablet by mouth at bedtime.   polyethylene glycol packet Commonly known as:  MIRALAX / GLYCOLAX Take 17 g by mouth. Give 17 gm in 4 oz of liquid daily as needed       Review of Systems  Constitutional: Negative for activity change,  appetite change, chills, diaphoresis, fatigue, fever and unexpected weight change.  HENT: Positive for hearing loss. Negative for congestion, dental problem, drooling, ear discharge, ear pain, facial swelling, mouth sores, nosebleeds, postnasal drip, sinus pain, sinus pressure, sneezing, sore  throat, tinnitus, trouble swallowing and voice change.        Hearing aid right   Eyes: Negative for photophobia, pain, discharge, redness, itching and visual disturbance.  Respiratory: Negative for apnea, cough, choking, chest tightness, shortness of breath, wheezing and stridor.   Cardiovascular: Negative for chest pain, palpitations and leg swelling.  Gastrointestinal: Positive for constipation. Negative for abdominal distention, abdominal pain, blood in stool, diarrhea, nausea and rectal pain.  Endocrine: Negative for heat intolerance, polydipsia, polyphagia and polyuria.  Genitourinary: Negative for difficulty urinating, dyspareunia, dysuria, enuresis, flank pain, frequency, hematuria and urgency.  Musculoskeletal: Positive for arthralgias, gait problem and myalgias. Negative for back pain, joint swelling, neck pain and neck stiffness.       Walker, falls  Skin: Positive for rash. Negative for color change, pallor and wound.       The left great toe ingrown toenail, left 2nd hammer toe C/o itching area at R+L wrists, a few scattered red spots w/o s/s of infection, duration and etiology uncertain   Allergic/Immunologic: Negative for environmental allergies, food allergies and immunocompromised state.  Neurological: Negative for dizziness, tremors, seizures, syncope, facial asymmetry, speech difficulty, weakness, light-headedness, numbness and headaches.  Hematological: Negative for adenopathy. Does not bruise/bleed easily.  Psychiatric/Behavioral: Positive for sleep disturbance. Negative for agitation, behavioral problems, confusion, decreased concentration, dysphoric mood and hallucinations. The patient  is nervous/anxious. The patient is not hyperactive.        Cried during today's visit when she mentioned her mother.     Vitals:   07/21/16 1203  BP: 130/80  Pulse: 76  Resp: 20  Temp: 97.4 F (36.3 C)  Weight: 136 lb (61.7 kg)  Height: 5\' 2"  (1.575 m)   Body mass index is 24.87 kg/m. Physical Exam  Constitutional: She is oriented to person, place, and time. She appears well-developed and well-nourished.  HENT:  Head: Normocephalic and atraumatic.  Right Ear: External ear normal.  Left Ear: External ear normal.  Nose: Nose normal.  Mouth/Throat: Oropharynx is clear and moist.  Eyes: Conjunctivae and EOM are normal. Pupils are equal, round, and reactive to light. Right eye exhibits no discharge. Left eye exhibits no discharge. No scleral icterus.  Neck: Normal range of motion. Neck supple. No JVD present. No tracheal deviation present. No thyromegaly present.  Cardiovascular: Normal rate, regular rhythm, normal heart sounds and intact distal pulses.  Exam reveals no friction rub.   No murmur heard. Pulmonary/Chest: Effort normal and breath sounds normal. No stridor. No respiratory distress. She has no wheezes. She has no rales. She exhibits no tenderness.  Abdominal: Soft. Bowel sounds are normal. She exhibits no distension. There is no tenderness. There is no rebound and no guarding.  Musculoskeletal: Normal range of motion. She exhibits no edema, tenderness or deformity.  The left 2nd hammer toe  Neurological: She is alert and oriented to person, place, and time. She displays normal reflexes. No cranial nerve deficit. She exhibits normal muscle tone. Coordination normal.  Skin: Skin is warm and dry. Rash noted. She is not diaphoretic. No erythema.  R+L wrist.   Psychiatric: She has a normal mood and affect. Her behavior is normal. Judgment and thought content normal.    Labs reviewed: Basic Metabolic Panel:  Recent Labs  12/07/15 2047 04/13/16  NA 141 141  K 3.9 4.2  CL  105  --   GLUCOSE 87  --   BUN 16 15  CREATININE 1.00 0.8   Liver Function Tests:  Recent Labs  04/13/16  AST 16  ALT 12  ALKPHOS 91   No results for input(s): LIPASE, AMYLASE in the last 8760 hours. No results for input(s): AMMONIA in the last 8760 hours. CBC:  Recent Labs  12/07/15 2037 12/07/15 2047 04/13/16  WBC 5.5  --  5.5  NEUTROABS 3.2  --   --   HGB 11.6* 11.2* 11.5*  HCT 34.6* 33.0* 35*  MCV 92.0  --   --   PLT 242  --  283   Cardiac Enzymes: No results for input(s): CKTOTAL, CKMB, CKMBINDEX, TROPONINI in the last 8760 hours. BNP: Invalid input(s): POCBNP No results found for: HGBA1C Lab Results  Component Value Date   TSH 1.20 04/13/2016   Lab Results  Component Value Date   [id number] 056 09/03/2013   No results found for: FOLATE Lab Results  Component Value Date   IRON 86 01/23/2015   FERRITIN 18.6 11/28/2013    Imaging and Procedures obtained prior to SNF admission: Dg Ribs Unilateral W/chest Right  Result Date: 02/13/2016 CLINICAL DATA:  Acute onset of right back and rib pain, after fall. Initial encounter. EXAM: RIGHT RIBS AND CHEST - 3+ VIEW COMPARISON:  Chest radiograph performed 08/25/2011 FINDINGS: No displaced rib fractures are seen. The lungs are well-aerated and clear. There is no evidence of focal opacification, pleural effusion or pneumothorax. The cardiomediastinal silhouette is borderline normal in size. No acute osseous abnormalities are seen. IMPRESSION: No displaced rib fracture seen. No acute cardiopulmonary process identified. Electronically Signed   By: Garald Balding M.D.   On: 02/13/2016 18:47    Assessment/Plan COPD (chronic obstructive pulmonary disease) with chronic bronchitis ([facility]) Stable.   GERD Stable, continue Famotidine. Update CBC CMP  Constipation Not well controlled on MiraLax prn, will try MiraLax qod  Anxiety state Not able to fall and maintain asleep at night, failed Melatonin, will try Lorazepam 0.25mg   qhs x 7 days, then re-eval,  continue Imipramine  Acute blood loss anemia Last Hgb 11.5 04/13/16, update CBC  Rash C/o itching area at R+L wrists, a few scattered red spots w/o s/s of infection, duration and etiology uncertain. Try 1% Hydrocortisone cream bid prn, observe.     Family/ staff Communication: AL  Labs/tests ordered: CBC CMP

## 2016-07-22 DIAGNOSIS — J449 Chronic obstructive pulmonary disease, unspecified: Secondary | ICD-10-CM | POA: Diagnosis not present

## 2016-07-22 DIAGNOSIS — D649 Anemia, unspecified: Secondary | ICD-10-CM | POA: Diagnosis not present

## 2016-08-17 ENCOUNTER — Encounter: Payer: Self-pay | Admitting: Internal Medicine

## 2016-08-17 ENCOUNTER — Non-Acute Institutional Stay: Payer: Medicare Other | Admitting: Internal Medicine

## 2016-08-17 VITALS — BP 138/78 | HR 89 | Temp 97.4°F | Resp 20 | Ht 62.0 in | Wt 141.4 lb

## 2016-08-17 DIAGNOSIS — G4700 Insomnia, unspecified: Secondary | ICD-10-CM

## 2016-08-17 DIAGNOSIS — M797 Fibromyalgia: Secondary | ICD-10-CM

## 2016-08-17 DIAGNOSIS — R682 Dry mouth, unspecified: Secondary | ICD-10-CM | POA: Diagnosis not present

## 2016-08-17 NOTE — Progress Notes (Signed)
[facility]  Provider: Blanchie Serve MD   Location:  [place] of Service:  Clinic (12)  PCP: Blanchie Serve, MD Patient Care Team: Blanchie Serve, MD as PCP - General (Internal Medicine) Mast, Man X, NP as Nurse Practitioner (Internal Medicine)  Extended Emergency Contact Information Primary Emergency Contact: Culbreth,Robin Address: [address] [place], [place] [address] [place] Phone: [phone] Mobile Phone: [phone] Relation: Daughter  Goals of Care: Advanced Directive information Advanced Directives 08/17/2016  Does Patient Have a Medical Advance Directive? Yes  Type of Advance Directive Living will  Does patient want to make changes to medical advance directive? No - Patient declined  Copy of [place] in Chart? -  Would patient like information on creating a medical advance directive? -  Pre-existing out of facility DNR order (yellow form or pink MOST form) -      Chief Complaint  Patient presents with  . Medical Management of Chronic Issues    HPI: Patient is a 83 y.o. female seen today for acute visit. She has been having trouble falling and staying asleep. She is on imipramine on med review. She is not taking melatonin anymore with her palpitations. She has history of fibromyalgia and has been on imipramine for years.denies any muscle aches today. Denies any worsening weakness with her legs. No fall reported.   Past Medical History:  Diagnosis Date  . Acute blood loss anemia 08/23/2013   04/13/16 TSH 1.20, Na 141, K 4.2, Bun 15, creat 0.79, wbc 5.5, Hgb 11.5, plt 283  . Anxiety   . Anxiety state 07/02/2007   Qualifier: Diagnosis of  By: Lenna Gilford MD, Deborra Medina   . 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 Cigarette smoker   . Colon polyps 2009   TWO TUBULAR ADENOMAS AND HYPERPLASTIC POLYPS  . Constipation 09/14/2013  . COPD (chronic obstructive pulmonary  disease) ([place])   . Diverticulosis of colon   . DJD (degenerative joint disease)   . Dog bite of limb 07/14/2010   Dog bite 07/10/10 - dogs shots utd Last td was 08/2009  Localized tx only.    . Fibromyalgia   . GERD 12/19/2006   Qualifier: Diagnosis of  By: Julien Girt CMA, Marliss Czar  04/13/16 TSH 1.20, Na 141, K 4.2, Bun 15, creat 0.79, wbc 5.5, Hgb 11.5, plt 283   . GERD (gastroesophageal reflux disease)   . Hammer toe of second toe of left foot 04/08/2016   Left 2nd  . Hearing loss   . Hemorrhoids   . Hypercholesterolemia   . Osteoarthritis 12/19/2006   Qualifier: Diagnosis of  By: Julien Girt CMA, Marliss Czar    . Osteoporosis    Past Surgical History:  Procedure Laterality Date  . CATARACT EXTRACTION, BILATERAL  2009  . COLONOSCOPY  2009, 2006 , 2017  . stapes surgery     Dr Thornell Mule  . TONSILLECTOMY AND ADENOIDECTOMY     as a child    reports that she quit smoking about 6 years ago. Her smoking use included Cigarettes. She has never used smokeless tobacco. She reports that she drinks alcohol. She reports that she does not use drugs. Social History   Social History  . Marital status: Widowed    Spouse name: N/A  . Number of children: N/A  . Years of education: N/A   Occupational History  . retired   .  works some weekends at [employer] Topics  . Smoking status: Former Smoker    Types: Cigarettes    Quit date: 02/15/2010  . Smokeless tobacco: Never Used  . Alcohol use Yes     Comment: occasional glass of wine  . Drug use: No  . Sexual activity: No   Other Topics Concern  . Not on file   Social History Narrative   Pt works some weekends at [employer]   Caffeine use - daily coffee in the mornings   Patient gets regular exercise > tries to walk daily for exercise   Moved to [place] 03/11/16   Widowed   Former Smoker-stopped 2012   Alcohol occasionally glass of wine   Living Will        Family History  Problem Relation Age of Onset   . Heart disease Father   . COPD Father   . Hypertension Mother   . Arthritis Mother     Health Maintenance  Topic Date Due  . DEXA SCAN  09/28/1998  . PNA vac Low Risk Adult (2 of 2 - PPSV23) 08/31/2016  . INFLUENZA VACCINE  09/15/2016  . TETANUS/TDAP  08/22/2019    Allergies  Allergen Reactions  . Penicillins Anaphylaxis  . Bisphosphonates Other (See Comments)    [name]  . Influenza Vaccines     Pt reported  . Simvastatin     Unknown   . Trazodone And Nefazodone     Felt her throat was closing up/kept her awake  . Sulfonamide Derivatives Rash and Other (See Comments)    blisters    Outpatient Encounter Prescriptions as of 08/17/2016  Medication Sig  . aspirin 81 MG tablet Take 81 mg by mouth daily.  . calcium carbonate (TUMS EX) 750 MG chewable tablet Chew 750 mg by mouth daily.   . Calcium Carbonate-Vit D-Min (CALCIUM 600+D PLUS MINERALS) 600-400 MG-UNIT TABS Take 1 tablet by mouth daily.   . cholecalciferol (VITAMIN D) 1000 UNITS tablet Take 1,000 Units by mouth daily.  . famotidine (PEPCID) 20 MG tablet TAKE ONE TABLET BY MOUTH ONCE DAILY  . imipramine (TOFRANIL) 25 MG tablet TAKE TWO TABLETS BY MOUTH ONCE DAILY  . Melatonin 3 MG TABS Take 1 tablet by mouth at bedtime.  . polyethylene glycol (MIRALAX / GLYCOLAX) packet Take 17 g by mouth. Give 17 gm in 4 oz of liquid daily as needed   No facility-administered encounter medications on file as of 08/17/2016.     Review of Systems  Constitutional: Negative for appetite change and fever.  HENT: Negative for rhinorrhea, sore throat and trouble swallowing.        Has dry mouth  Respiratory: Negative for cough and shortness of breath.   Cardiovascular: Negative for chest pain, palpitations and leg swelling.       No further palpitations after stopping melatonin  Gastrointestinal: Positive for constipation. Negative for abdominal pain, nausea and vomiting.       Miralax helps with bowel movement  Genitourinary:  Positive for difficulty urinating. Negative for dysuria and hematuria.  Musculoskeletal:       Uses rollator walker to get around  Skin: Negative for rash.  Neurological: Positive for dizziness. Negative for seizures and headaches.  Psychiatric/Behavioral: Negative for confusion and dysphoric mood. The patient is not nervous/anxious.     Vitals:   08/17/16 0923  BP: 138/78  Pulse: 89  Resp: 20  Temp: 97.4 F (  36.3 C)  SpO2: 96%  Weight: 141 lb 6.4 oz (64.1 kg)  Height: 5\' 2"  (1.575 m)   Body mass index is 25.86 kg/m. Physical Exam  Constitutional: She is oriented to person, place, and time. She appears well-developed and well-nourished. No distress.  HENT:  Head: Normocephalic and atraumatic.  Dry mouth, normal oropharynx otherwise  Eyes: Conjunctivae and EOM are normal. Pupils are equal, round, and reactive to light.  Neck: Normal range of motion. Neck supple.  Cardiovascular: Normal rate, regular rhythm and normal heart sounds.   Pulmonary/Chest: Effort normal and breath sounds normal.  Abdominal: Soft. Bowel sounds are normal. There is no tenderness.  Musculoskeletal:  Arthritis changes to her fingers, limited ROM with shoulders, uses rolling walker, unsteady gait, can move all 4 extremities  Lymphadenopathy:    She has no cervical adenopathy.  Neurological: She is alert and oriented to person, place, and time.  Skin: Skin is warm and dry. No rash noted. She is not diaphoretic. No erythema.  Psychiatric: She has a normal mood and affect. Her behavior is normal.    Labs reviewed: Basic Metabolic Panel:  Recent Labs  12/07/15 2047 04/13/16  NA 141 141  K 3.9 4.2  CL 105  --   GLUCOSE 87  --   BUN 16 15  CREATININE 1.00 0.8   Liver Function Tests:  Recent Labs  04/13/16  AST 16  ALT 12  ALKPHOS 91   No results for input(s): LIPASE, AMYLASE in the last 8760 hours. No results for input(s): AMMONIA in the last 8760 hours. CBC:  Recent Labs   12/07/15 2037 12/07/15 2047 04/13/16  WBC 5.5  --  5.5  NEUTROABS 3.2  --   --   HGB 11.6* 11.2* 11.5*  HCT 34.6* 33.0* 35*  MCV 92.0  --   --   PLT 242  --  283   Cardiac Enzymes: No results for input(s): CKTOTAL, CKMB, CKMBINDEX, TROPONINI in the last 8760 hours. BNP: Invalid input(s): POCBNP No results found for: HGBA1C Lab Results  Component Value Date   TSH 1.20 04/13/2016   Lab Results  Component Value Date   VITAMINB12 588 09/03/2013   No results found for: FOLATE Lab Results  Component Value Date   IRON 86 01/23/2015   FERRITIN 18.6 11/28/2013    Lipid Panel: No results for input(s): CHOL, HDL, LDLCALC, TRIG, CHOLHDL, LDLDIRECT in the last 8760 hours. No results found for: HGBA1C  Procedures since last visit: No results found.  Assessment/Plan  Fibromyalgia Currently on imipramine. Has complaints of dry mouth, constipation and insomnia which are common side effects of this med. Decrease imipramine to 25 mg daily x 1 week, then 25 mg qod x 1 week and stop. Start lyrica 25 mg daily x 1 week and then bid and reassess in 4 weeks.   Dry mouth Likely side effect from anticholinergic effect of imipramine, see changes above and monitor  Insomnia Allergic to trazodone, did not tolerate melatonin with side effect. Possible side effect of imipramine, discontinue this with taper as above and monitor.    Labs/tests ordered:  none  Next appointment: 4 weeks or earlier if needed  Communication: patient and AL nurse   Blanchie Serve, MD Internal Medicine [place], [place] [address] Cell Phone (Monday-Friday 8 am - 5 pm): [phone] On Call: [phone] and follow prompts after 5 pm and on weekends Office Phone: [phone] Office Fax: [phone]

## 2016-09-02 DIAGNOSIS — F22 Delusional disorders: Secondary | ICD-10-CM | POA: Diagnosis not present

## 2016-09-15 ENCOUNTER — Encounter: Payer: Self-pay | Admitting: Nurse Practitioner

## 2016-09-15 ENCOUNTER — Non-Acute Institutional Stay: Payer: Medicare Other | Admitting: Nurse Practitioner

## 2016-09-15 DIAGNOSIS — M797 Fibromyalgia: Secondary | ICD-10-CM | POA: Diagnosis not present

## 2016-09-15 DIAGNOSIS — R4182 Altered mental status, unspecified: Secondary | ICD-10-CM

## 2016-09-15 DIAGNOSIS — R1312 Dysphagia, oropharyngeal phase: Secondary | ICD-10-CM | POA: Diagnosis not present

## 2016-09-15 DIAGNOSIS — E7800 Pure hypercholesterolemia, unspecified: Secondary | ICD-10-CM | POA: Diagnosis not present

## 2016-09-15 DIAGNOSIS — J4489 Other specified chronic obstructive pulmonary disease: Secondary | ICD-10-CM

## 2016-09-15 DIAGNOSIS — R05 Cough: Secondary | ICD-10-CM | POA: Diagnosis not present

## 2016-09-15 DIAGNOSIS — J449 Chronic obstructive pulmonary disease, unspecified: Secondary | ICD-10-CM

## 2016-09-15 DIAGNOSIS — R202 Paresthesia of skin: Secondary | ICD-10-CM

## 2016-09-15 DIAGNOSIS — F22 Delusional disorders: Secondary | ICD-10-CM | POA: Insufficient documentation

## 2016-09-15 LAB — BASIC METABOLIC PANEL
BUN: 14 (ref 4–21)
Creatinine: 0.8 (ref <=1.1)
Glucose: 109
Potassium: 4.5 (ref 3.4–5.3)
SODIUM: 142 (ref 137–147)

## 2016-09-15 LAB — CBC AND DIFFERENTIAL
HEMATOCRIT: 39 (ref 36–46)
HEMOGLOBIN: 13.3 (ref 12.0–16.0)
Platelets: 243 (ref 150–399)
WBC: 9.4

## 2016-09-15 LAB — HEPATIC FUNCTION PANEL
ALK PHOS: 111 (ref 25–125)
ALT: 12 (ref 7–35)
AST: 18 (ref 13–35)
BILIRUBIN, TOTAL: 0.8

## 2016-09-15 NOTE — Assessment & Plan Note (Signed)
She is apparently confusion, but she is oriented to self and place. No focal neurological symptoms, blood pressure is normalized after re-checked, will update CBC, CMP, UA C/S, CXR, observe the patient. May consider hold Lyrica is workups unremarkable.

## 2016-09-15 NOTE — Assessment & Plan Note (Signed)
Hx of anxietyFibromyalgia, 08/17/16 tapered off Imipramine and started Lyrica, she reported no pain today.

## 2016-09-15 NOTE — Progress Notes (Signed)
Location:  [place] Room Number: 177 Place of Service:  ALF [phone]) Provider:  Inda Mcglothen, Manxie  NP  Blanchie Serve, MD  Patient Care Team: Blanchie Serve, MD as PCP - General (Internal Medicine) Ceana Fiala X, NP as Nurse Practitioner (Internal Medicine)  Extended Emergency Contact Information Primary Emergency Contact: Culbreth,Robin Address: [address] [place], [place] [address] [place] Phone: [phone] Mobile Phone: [phone] Relation: Daughter  Code Status:  DNR Goals of care: Advanced Directive information Advanced Directives 09/15/2016  Does Patient Have a Medical Advance Directive? Yes  Type of Advance Directive Living will  Does patient want to make changes to medical advance directive? No - Patient declined  Copy of [place] in Chart? -  Would patient like information on creating a medical advance directive? -  Pre-existing out of facility DNR order (yellow form or pink MOST form) -     Chief Complaint  Patient presents with  . Acute Visit    Elevated BP, sore throat, confusion.    HPI:  Pt is a 83 y.o. female seen today for an acute visit for c/o sore throat, confusion,  raspy voice x 1 day, blood pressure elevated but normalized when re-checked, afebrile, no O2 desaturation, she was noted to have non productive cough, she denied chest pain, SOB, palpitation, or sputum production. Hx of COPD, not taking maintenance meds.   C/o "worms" feeling in hands and feet, cant's provided detailed HPI due to onset of confusion, no focal weakness noted.   Hx of anxietyFibromyalgia, 08/17/16 tapered off Imipramine and started Lyrica, she reported no pain today.    Past Medical History:  Diagnosis Date  . Acute blood loss anemia 08/23/2013   04/13/16 TSH 1.20, Na 141, K 4.2, Bun 15, creat 0.79, wbc 5.5, Hgb 11.5, plt 283  . Anxiety   . Anxiety state 07/02/2007   Qualifier: Diagnosis of  By: Lenna Gilford  MD, Deborra Medina   . 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 Cigarette smoker   . Colon polyps 2009   TWO TUBULAR ADENOMAS AND HYPERPLASTIC POLYPS  . Constipation 09/14/2013  . COPD (chronic obstructive pulmonary disease) ([place])   . Diverticulosis of colon   . DJD (degenerative joint disease)   . Dog bite of limb 07/14/2010   Dog bite 07/10/10 - dogs shots utd Last td was 08/2009  Localized tx only.    . Fibromyalgia   . GERD 12/19/2006   Qualifier: Diagnosis of  By: Julien Girt CMA, Marliss Czar  04/13/16 TSH 1.20, Na 141, K 4.2, Bun 15, creat 0.79, wbc 5.5, Hgb 11.5, plt 283   . GERD (gastroesophageal reflux disease)   . Hammer toe of second toe of left foot 04/08/2016   Left 2nd  . Hearing loss   . Hemorrhoids   . Hypercholesterolemia   . Osteoarthritis 12/19/2006   Qualifier: Diagnosis of  By: Julien Girt CMA, Marliss Czar    . Osteoporosis    Past Surgical History:  Procedure Laterality Date  . CATARACT EXTRACTION, BILATERAL  2009  . COLONOSCOPY  2009, 2006 , 2017  . stapes surgery     Dr Thornell Mule  . TONSILLECTOMY AND ADENOIDECTOMY     as a child    Allergies  Allergen Reactions  . Penicillins Anaphylaxis  . Bisphosphonates Other (See Comments)    [name]  . Influenza Vaccines     Pt reported  . Simvastatin  Unknown   . Trazodone And Nefazodone     Felt her throat was closing up/kept her awake  . Sulfonamide Derivatives Rash and Other (See Comments)    blisters    Outpatient Encounter Prescriptions as of 09/15/2016  Medication Sig  . aspirin 81 MG tablet Take 81 mg by mouth daily.  . calcium carbonate (TUMS EX) 750 MG chewable tablet Chew 750 mg by mouth daily.   . Calcium Carbonate-Vit D-Min (CALCIUM 600+D PLUS MINERALS) 600-400 MG-UNIT TABS Take 1 tablet by mouth daily.   . cholecalciferol (VITAMIN D) 1000 UNITS tablet Take 1,000 Units by mouth daily.  . famotidine (PEPCID) 20 MG tablet TAKE ONE TABLET BY MOUTH ONCE DAILY  . hydrocortisone cream 1 % Apply 1 application  topically 2 (two) times daily. Apply to wrists as needed.  . polyethylene glycol (MIRALAX / GLYCOLAX) packet Take 17 g by mouth. Give 17 gm in 4 oz of liquid daily as needed  . pregabalin (LYRICA) 25 MG capsule Take 25 mg by mouth 2 (two) times daily. Take for 1 week, then take 25 mg twice daily for fibromyalgia.   . [DISCONTINUED] imipramine (TOFRANIL) 25 MG tablet TAKE TWO TABLETS BY MOUTH ONCE DAILY (Patient taking differently: Take 1 tablet by mouth daily x 1 week, then everyother day x 1 week, then stop medication.)   No facility-administered encounter medications on file as of 09/15/2016.     Review of Systems  Constitutional: Negative for activity change, appetite change and fever.       Didn't eat breakfast  HENT: Positive for sore throat. Negative for congestion, ear pain, mouth sores, rhinorrhea and trouble swallowing.        Raspy voice  Respiratory: Positive for cough. Negative for shortness of breath.   Cardiovascular: Negative for chest pain, palpitations and leg swelling.  Gastrointestinal: Positive for constipation. Negative for abdominal pain, nausea and vomiting.  Genitourinary: Negative for difficulty urinating, dysuria and urgency.  Musculoskeletal:       Uses rollator walker to get around  Skin: Negative for rash.  Neurological: Negative for dizziness, seizures and headaches.       C/o sensation of  "worms" in hands and feet  Psychiatric/Behavioral: Positive for confusion. Negative for agitation and behavioral problems. The patient is not nervous/anxious.     Immunization History  Administered Date(s) Administered  . Pneumococcal Conjugate-13 09/01/2015  . Pneumococcal Polysaccharide-23 07/07/1998  . Td 08/21/2009   Pertinent  Health Maintenance Due  Topic Date Due  . DEXA SCAN  09/28/1998  . PNA vac Low Risk Adult (2 of 2 - PPSV23) 08/31/2016  . INFLUENZA VACCINE  09/15/2016   Fall Risk  09/01/2015 08/31/2012  Falls in the past year? Yes No  Number falls in  past yr: 1 -  Injury with Fall? No -  Risk for fall due to : Impaired balance/gait -   Functional Status Survey:    Vitals:   09/15/16 1002  BP: (!) 180/110  Pulse: 74  Resp: 20  Temp: 98 F (36.7 C)  Weight: 140 lb 3.2 oz (63.6 kg)  Height: 5\' 2"  (1.575 m)   Body mass index is 25.64 kg/m. Physical Exam  Constitutional: She appears well-developed and well-nourished. No distress.  HENT:  Head: Normocephalic and atraumatic.  Redness in her throat.   Eyes: Pupils are equal, round, and reactive to light. Conjunctivae and EOM are normal.  Neck: Normal range of motion. Neck supple.  Cardiovascular: Normal rate, regular rhythm and normal heart sounds.  Pulmonary/Chest: Effort normal and breath sounds normal.  Abdominal: Soft. Bowel sounds are normal. There is no tenderness.  Musculoskeletal: She exhibits no edema.  Arthritis changes to her fingers, limited ROM with shoulders, uses rolling walker  Lymphadenopathy:    She has no cervical adenopathy.  Neurological: She is alert. No cranial nerve deficit. She exhibits normal muscle tone.  Oriented to self and place  Skin: Skin is warm and dry. No rash noted. She is not diaphoretic. No erythema.  Psychiatric: She has a normal mood and affect. Her behavior is normal.    Labs reviewed:  Recent Labs  12/07/15 2047 04/13/16  NA 141 141  K 3.9 4.2  CL 105  --   GLUCOSE 87  --   BUN 16 15  CREATININE 1.00 0.8    Recent Labs  04/13/16  AST 16  ALT 12  ALKPHOS 91    Recent Labs  12/07/15 2037 12/07/15 2047 04/13/16  WBC 5.5  --  5.5  NEUTROABS 3.2  --   --   HGB 11.6* 11.2* 11.5*  HCT 34.6* 33.0* 35*  MCV 92.0  --   --   PLT 242  --  283   Lab Results  Component Value Date   TSH 1.20 04/13/2016   No results found for: HGBA1C Lab Results  Component Value Date   CHOL 186 01/23/2015   HDL 100.20 01/23/2015   LDLCALC 66 01/23/2015   LDLDIRECT 114.7 08/18/2011   TRIG 101.0 01/23/2015   CHOLHDL 2 01/23/2015     Significant Diagnostic Results in last 30 days:  No results found.  Assessment/Plan COPD (chronic obstructive pulmonary disease) with chronic bronchitis (HCC)  c/o sore throat, raspy voice x 1 day, blood pressure elevated but normalized when re-checked, afebrile, no O2 desaturation, she was noted to have non productive cough, she denied chest pain, SOB, palpitation, or sputum production. Hx of COPD, not taking maintenance meds.  Obtain CXR to r/o exacerbation of COPD  Paresthesias C/o "worms" in hands and feet, duration is uncertain, will update CBC CMP   Altered mental status She is apparently confusion, but she is oriented to self and place. No focal neurological symptoms, blood pressure is normalized after re-checked, will update CBC, CMP, UA C/S, CXR, observe the patient. May consider hold Lyrica is workups unremarkable.   Fibromyalgia Hx of anxietyFibromyalgia, 08/17/16 tapered off Imipramine and started Lyrica, she reported no pain today.       Family/ staff Communication: AL  Labs/tests ordered:  CBC CMP UA C/S CXR

## 2016-09-15 NOTE — Assessment & Plan Note (Signed)
C/o "worms" in hands and feet, duration is uncertain, will update CBC CMP

## 2016-09-15 NOTE — Assessment & Plan Note (Signed)
c/o sore throat, raspy voice x 1 day, blood pressure elevated but normalized when re-checked, afebrile, no O2 desaturation, she was noted to have non productive cough, she denied chest pain, SOB, palpitation, or sputum production. Hx of COPD, not taking maintenance meds.  Obtain CXR to r/o exacerbation of COPD

## 2016-09-16 ENCOUNTER — Other Ambulatory Visit: Payer: Self-pay | Admitting: *Deleted

## 2016-09-17 ENCOUNTER — Encounter: Payer: Self-pay | Admitting: Nurse Practitioner

## 2016-09-17 DIAGNOSIS — N390 Urinary tract infection, site not specified: Secondary | ICD-10-CM | POA: Insufficient documentation

## 2016-09-20 ENCOUNTER — Non-Acute Institutional Stay: Payer: Medicare Other | Admitting: Nurse Practitioner

## 2016-09-20 ENCOUNTER — Encounter: Payer: Self-pay | Admitting: Nurse Practitioner

## 2016-09-20 DIAGNOSIS — F411 Generalized anxiety disorder: Secondary | ICD-10-CM | POA: Diagnosis not present

## 2016-09-20 DIAGNOSIS — R443 Hallucinations, unspecified: Secondary | ICD-10-CM | POA: Diagnosis not present

## 2016-09-20 DIAGNOSIS — R442 Other hallucinations: Secondary | ICD-10-CM

## 2016-09-20 DIAGNOSIS — N390 Urinary tract infection, site not specified: Secondary | ICD-10-CM

## 2016-09-20 NOTE — Progress Notes (Signed)
Location:  [facility] Room Number: 093 Place of Service:  ALF [phone]) Provider:  Mast, Manxie  NP  Blanchie Serve, MD  Patient Care Team: Blanchie Serve, MD as PCP - General (Internal Medicine) Mast, Man X, NP as Nurse Practitioner (Internal Medicine)  Extended Emergency Contact Information Primary Emergency Contact: Culbreth,Robin Address: [address] [place], [place] [address] [place] Phone: [phone] Mobile Phone: [phone] Relation: Daughter  Code Status:  DNR Goals of care: Advanced Directive information Advanced Directives 09/20/2016  Does Patient Have a Medical Advance Directive? Yes  Type of Advance Directive Living will  Does patient want to make changes to medical advance directive? No - Patient declined  Copy of [place] in Chart? -  Would patient like information on creating a medical advance directive? -  Pre-existing out of facility DNR order (yellow form or pink MOST form) -     Chief Complaint  Patient presents with  . Acute Visit    UTI    HPI:  Pt is a 83 y.o. female seen today for an acute visit for    Past Medical History:  Diagnosis Date  . Acute blood loss anemia 08/23/2013   04/13/16 TSH 1.20, Na 141, K 4.2, Bun 15, creat 0.79, wbc 5.5, Hgb 11.5, plt 283  . Anxiety   . Anxiety state 07/02/2007   Qualifier: Diagnosis of  By: Lenna Gilford MD, Deborra Medina   . 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 Cigarette smoker   . Colon polyps 2009   TWO TUBULAR ADENOMAS AND HYPERPLASTIC POLYPS  . Constipation 09/14/2013  . COPD (chronic obstructive pulmonary disease) ([facility])   . Diverticulosis of colon   . DJD (degenerative joint disease)   . Dog bite of limb 07/14/2010   Dog bite 07/10/10 - dogs shots utd Last td was 08/2009  Localized tx only.    . Fibromyalgia   . GERD 12/19/2006   Qualifier: Diagnosis of  By: Julien Girt CMA, Marliss Czar  04/13/16 TSH 1.20, Na 141, K 4.2, Bun 15,  creat 0.79, wbc 5.5, Hgb 11.5, plt 283   . GERD (gastroesophageal reflux disease)   . Hammer toe of second toe of left foot 04/08/2016   Left 2nd  . Hearing loss   . Hemorrhoids   . Hypercholesterolemia   . Osteoarthritis 12/19/2006   Qualifier: Diagnosis of  By: Julien Girt CMA, Marliss Czar    . Osteoporosis    Past Surgical History:  Procedure Laterality Date  . CATARACT EXTRACTION, BILATERAL  2009  . COLONOSCOPY  2009, 2006 , 2017  . stapes surgery     Dr Thornell Mule  . TONSILLECTOMY AND ADENOIDECTOMY     as a child    Allergies  Allergen Reactions  . Penicillins Anaphylaxis  . Bisphosphonates Other (See Comments)    [name]  . Influenza Vaccines     Pt reported  . Simvastatin     Unknown   . Trazodone And Nefazodone     Felt her throat was closing up/kept her awake  . Sulfonamide Derivatives Rash and Other (See Comments)    blisters    Outpatient Encounter Prescriptions as of 09/20/2016  Medication Sig  . aspirin 81 MG tablet Take 81 mg by mouth daily.  . calcium carbonate (TUMS EX) 750 MG chewable tablet Chew 750 mg by mouth daily.   . Calcium Carbonate-Vit D-Min (CALCIUM 600+D PLUS MINERALS) 600-400 MG-UNIT  TABS Take 1 tablet by mouth daily.   . cholecalciferol (VITAMIN D) 1000 UNITS tablet Take 1,000 Units by mouth daily.  Marland Kitchen dextromethorphan 7.5 MG/5ML SYRP Take 7.5 mg by mouth every 6 (six) hours as needed.  . famotidine (PEPCID) 20 MG tablet TAKE ONE TABLET BY MOUTH ONCE DAILY  . hydrocortisone cream 1 % Apply 1 application topically 2 (two) times daily. Apply to wrists as needed.  . loratadine (CLARITIN) 10 MG tablet Take 10 mg by mouth daily.  . nitrofurantoin, macrocrystal-monohydrate, (MACROBID) 100 MG capsule Take 100 mg by mouth 2 (two) times daily.  . polyethylene glycol (MIRALAX / GLYCOLAX) packet Take 17 g by mouth. Give 17 gm in 4 oz of liquid daily as needed  . pregabalin (LYRICA) 25 MG capsule Take 25 mg by mouth 2 (two) times daily. Take for 1 week, then take  25 mg twice daily for fibromyalgia.   Marland Kitchen saccharomyces boulardii (FLORASTOR) 250 MG capsule Take 250 mg by mouth 2 (two) times daily.  . [DISCONTINUED] guaifenesin (ROBITUSSIN) 100 MG/5ML syrup Take 10 mLs by mouth every 6 (six) hours as needed for cough.    No facility-administered encounter medications on file as of 09/20/2016.     Review of Systems  Immunization History  Administered Date(s) Administered  . Pneumococcal Conjugate-13 09/01/2015  . Pneumococcal Polysaccharide-23 07/07/1998  . Td 08/21/2009   Pertinent  Health Maintenance Due  Topic Date Due  . DEXA SCAN  09/28/1998  . PNA vac Low Risk Adult (2 of 2 - PPSV23) 08/31/2016  . INFLUENZA VACCINE  09/15/2016   Fall Risk  09/01/2015 08/31/2012  Falls in the past year? Yes No  Number falls in past yr: 1 -  Injury with Fall? No -  Risk for fall due to : Impaired balance/gait -   Functional Status Survey:    Vitals:   09/20/16 1530  BP: 114/70  Pulse: 76  Resp: 20  Temp: 97.9 F (36.6 C)  Weight: 137 lb 9.6 oz (62.4 kg)  Height: 5\' 2"  (1.575 m)   Body mass index is 25.17 kg/m. Physical Exam  Labs reviewed:  Recent Labs  12/07/15 2047 04/13/16 09/15/16  NA 141 141 142  K 3.9 4.2 4.5  CL 105  --   --   GLUCOSE 87  --   --   BUN 16 15 14   CREATININE 1.00 0.8 0.8    Recent Labs  04/13/16 09/15/16  AST 16 18  ALT 12 12  ALKPHOS 91 111    Recent Labs  12/07/15 2037 12/07/15 2047 04/13/16 09/15/16  WBC 5.5  --  5.5 9.4  NEUTROABS 3.2  --   --   --   HGB 11.6* 11.2* 11.5* 13.3  HCT 34.6* 33.0* 35* 39  MCV 92.0  --   --   --   PLT 242  --  283 243   Lab Results  Component Value Date   TSH 1.20 04/13/2016   No results found for: HGBA1C Lab Results  Component Value Date   CHOL 186 01/23/2015   HDL 100.20 01/23/2015   LDLCALC 66 01/23/2015   LDLDIRECT 114.7 08/18/2011   TRIG 101.0 01/23/2015   CHOLHDL 2 01/23/2015    Significant Diagnostic Results in last 30 days:  No results  found.  Assessment/Plan There are no diagnoses linked to this encounter.   Family/ staff Communication:   Labs/tests ordered:

## 2016-09-20 NOTE — Assessment & Plan Note (Signed)
09/15/16 urine culture E Coli, Nitrofurantoin 100mg  bid x 7 days.

## 2016-09-20 NOTE — Assessment & Plan Note (Addendum)
The patient cant's provided detailed HPI due to her confusion, no focal weakness noted. C/o sensation of  "worms/mites" in hands and feet prior onset of UTI, Now she sees "mites" when she pointed to the veins on her hands.  09/02/16 Psych consult: may consider Risperdal 0.25mg  qhs, will try Risperdal 0.25mg  qhs if POA consents

## 2016-09-20 NOTE — Progress Notes (Signed)
Location:  [facility] Room Number: 938 Place of Service:  ALF [phone]) Provider:  Moniqua Engebretsen, Manxie  NP  Blanchie Serve, MD  Patient Care Team: Blanchie Serve, MD as PCP - General (Internal Medicine) Patrick Salemi X, NP as Nurse Practitioner (Internal Medicine)  Extended Emergency Contact Information Primary Emergency Contact: Culbreth,Robin Address: [address] [place], [place] [address] [place] Phone: [phone] Mobile Phone: [phone] Relation: Daughter  Code Status:  DNR Goals of care: Advanced Directive information Advanced Directives 09/20/2016  Does Patient Have a Medical Advance Directive? Yes  Type of Advance Directive Living will  Does patient want to make changes to medical advance directive? No - Patient declined  Copy of [place] in Chart? -  Would patient like information on creating a medical advance directive? -  Pre-existing out of facility DNR order (yellow form or pink MOST form) -     Chief Complaint  Patient presents with  . Acute Visit    UTI    HPI:  Pt is a 83 y.o. female seen today for an acute visit for UTI, 09/15/16 urine culture E Coli, Nitrofurantoin 100mg  bid x 7 days. She denied nausea, vomiting, constipation, she is afebrile, she is incontinent of urine.   The patient cant's provided detailed HPI due to onset of confusion, no focal weakness noted. C/o sensation of  "worms/mites" in hands and feet prior onset of UTI, Now she sees "mites" when she pointed to the veins on her hands  Hx of anxiety/Fibromyalgia, 08/17/16 tapered off Imipramine and started Lyrica, she reported no pain today.    Past Medical History:  Diagnosis Date  . Acute blood loss anemia 08/23/2013   04/13/16 TSH 1.20, Na 141, K 4.2, Bun 15, creat 0.79, wbc 5.5, Hgb 11.5, plt 283  . Anxiety   . Anxiety state 07/02/2007   Qualifier: Diagnosis of  By: Lenna Gilford MD, Deborra Medina   . Carotid artery-cavernous sinus  fistula ([facility]) 03/23/2016  . Cigarette smoker   . Colon polyps 2009   TWO TUBULAR ADENOMAS AND HYPERPLASTIC POLYPS  . Constipation 09/14/2013  . COPD (chronic obstructive pulmonary disease) ([facility])   . Diverticulosis of colon   . DJD (degenerative joint disease)   . Dog bite of limb 07/14/2010   Dog bite 07/10/10 - dogs shots utd Last td was 08/2009  Localized tx only.    . Fibromyalgia   . GERD 12/19/2006   Qualifier: Diagnosis of  By: Julien Girt CMA, Marliss Czar  04/13/16 TSH 1.20, Na 141, K 4.2, Bun 15, creat 0.79, wbc 5.5, Hgb 11.5, plt 283   . GERD (gastroesophageal reflux disease)   . Hammer toe of second toe of left foot 04/08/2016   Left 2nd  . Hearing loss   . Hemorrhoids   . Hypercholesterolemia   . Osteoarthritis 12/19/2006   Qualifier: Diagnosis of  By: Julien Girt CMA, Marliss Czar    . Osteoporosis    Past Surgical History:  Procedure Laterality Date  . CATARACT EXTRACTION, BILATERAL  2009  . COLONOSCOPY  2009, 2006 , 2017  . stapes surgery     Dr Thornell Mule  . TONSILLECTOMY AND ADENOIDECTOMY     as a child    Allergies  Allergen Reactions  . Penicillins Anaphylaxis  . Bisphosphonates Other (See Comments)    [name]  . Influenza Vaccines     Pt reported  . Simvastatin  Unknown   . Trazodone And Nefazodone     Felt her throat was closing up/kept her awake  . Sulfonamide Derivatives Rash and Other (See Comments)    blisters    Outpatient Encounter Prescriptions as of 09/20/2016  Medication Sig  . aspirin 81 MG tablet Take 81 mg by mouth daily.  . calcium carbonate (TUMS EX) 750 MG chewable tablet Chew 750 mg by mouth daily.   . Calcium Carbonate-Vit D-Min (CALCIUM 600+D PLUS MINERALS) 600-400 MG-UNIT TABS Take 1 tablet by mouth daily.   . cholecalciferol (VITAMIN D) 1000 UNITS tablet Take 1,000 Units by mouth daily.  Marland Kitchen dextromethorphan 7.5 MG/5ML SYRP Take 7.5 mg by mouth every 6 (six) hours as needed.  . famotidine (PEPCID) 20 MG tablet TAKE ONE TABLET BY MOUTH ONCE DAILY  .  hydrocortisone cream 1 % Apply 1 application topically 2 (two) times daily. Apply to wrists as needed.  . loratadine (CLARITIN) 10 MG tablet Take 10 mg by mouth daily.  . nitrofurantoin, macrocrystal-monohydrate, (MACROBID) 100 MG capsule Take 100 mg by mouth 2 (two) times daily.  . polyethylene glycol (MIRALAX / GLYCOLAX) packet Take 17 g by mouth. Give 17 gm in 4 oz of liquid daily as needed  . pregabalin (LYRICA) 25 MG capsule Take 25 mg by mouth 2 (two) times daily. Take for 1 week, then take 25 mg twice daily for fibromyalgia.   Marland Kitchen saccharomyces boulardii (FLORASTOR) 250 MG capsule Take 250 mg by mouth 2 (two) times daily.  . [DISCONTINUED] guaifenesin (ROBITUSSIN) 100 MG/5ML syrup Take 10 mLs by mouth every 6 (six) hours as needed for cough.    No facility-administered encounter medications on file as of 09/20/2016.     Review of Systems  Constitutional: Negative for chills, fatigue and fever.       Didn't eat breakfast  HENT: Negative for congestion, sore throat and trouble swallowing.        Raspy voice  Eyes: Negative for visual disturbance.  Respiratory: Positive for cough. Negative for shortness of breath.   Cardiovascular: Negative for chest pain, palpitations and leg swelling.  Gastrointestinal: Positive for abdominal pain. Negative for abdominal distention, constipation, diarrhea, nausea and vomiting.       Suprapubic area discomfort.   Genitourinary: Negative for difficulty urinating and dysuria.       Incontinent urine, suprapubic area discomfort  Musculoskeletal: Positive for arthralgias, gait problem and neck pain.       Uses rollator walker to get around.c/o pain in neck, hands, legs at night.   Skin: Negative for rash.  Neurological: Negative for dizziness, seizures and headaches.  Psychiatric/Behavioral: Positive for confusion and hallucinations. Negative for agitation and behavioral problems. The patient is not nervous/anxious.        C/o sensation of  "worms/mites" in  hands and feet prior onset of UTI Now she sees "mites" when she pointed to the veins on her hands    Immunization History  Administered Date(s) Administered  . Pneumococcal Conjugate-13 09/01/2015  . Pneumococcal Polysaccharide-23 07/07/1998  . Td 08/21/2009   Pertinent  Health Maintenance Due  Topic Date Due  . DEXA SCAN  09/28/1998  . PNA vac Low Risk Adult (2 of 2 - PPSV23) 08/31/2016  . INFLUENZA VACCINE  09/15/2016   Fall Risk  09/01/2015 08/31/2012  Falls in the past year? Yes No  Number falls in past yr: 1 -  Injury with Fall? No -  Risk for fall due to : Impaired balance/gait -   Functional  Status Survey:    Vitals:   09/20/16 1530  BP: 114/70  Pulse: 76  Resp: 20  Temp: 97.9 F (36.6 C)  Weight: 137 lb 9.6 oz (62.4 kg)  Height: 5\' 2"  (1.575 m)   Body mass index is 25.17 kg/m. Physical Exam  Constitutional: She appears well-developed and well-nourished. No distress.  HENT:  Head: Normocephalic and atraumatic.  Redness in her throat.   Eyes: Pupils are equal, round, and reactive to light. Conjunctivae and EOM are normal.  Neck: Normal range of motion. Neck supple.  Cardiovascular: Normal rate, regular rhythm and normal heart sounds.   Pulmonary/Chest: Effort normal and breath sounds normal.  Abdominal: Soft. Bowel sounds are normal. There is no tenderness.  Musculoskeletal: She exhibits no edema.  Arthritis changes to her fingers, limited ROM with shoulders, uses rolling walker  Lymphadenopathy:    She has no cervical adenopathy.  Neurological: She is alert. No cranial nerve deficit. She exhibits normal muscle tone.  Oriented to self and place  Skin: Skin is warm and dry. No rash noted. She is not diaphoretic. No erythema.  Psychiatric: She has a normal mood and affect. Her behavior is normal.    Labs reviewed:  Recent Labs  12/07/15 2047 04/13/16 09/15/16  NA 141 141 142  K 3.9 4.2 4.5  CL 105  --   --   GLUCOSE 87  --   --   BUN 16 15 14     CREATININE 1.00 0.8 0.8    Recent Labs  04/13/16 09/15/16  AST 16 18  ALT 12 12  ALKPHOS 91 111    Recent Labs  12/07/15 2037 12/07/15 2047 04/13/16 09/15/16  WBC 5.5  --  5.5 9.4  NEUTROABS 3.2  --   --   --   HGB 11.6* 11.2* 11.5* 13.3  HCT 34.6* 33.0* 35* 39  MCV 92.0  --   --   --   PLT 242  --  283 243   Lab Results  Component Value Date   TSH 1.20 04/13/2016   No results found for: HGBA1C Lab Results  Component Value Date   CHOL 186 01/23/2015   HDL 100.20 01/23/2015   LDLCALC 66 01/23/2015   LDLDIRECT 114.7 08/18/2011   TRIG 101.0 01/23/2015   CHOLHDL 2 01/23/2015    Significant Diagnostic Results in last 30 days:  No results found.  Assessment/Plan Hallucination of body sensation The patient cant's provided detailed HPI due to her confusion, no focal weakness noted. C/o sensation of  "worms/mites" in hands and feet prior onset of UTI, Now she sees "mites" when she pointed to the veins on her hands.  09/02/16 Psych consult: may consider Risperdal 0.25mg  qhs, will try Risperdal 0.25mg  qhs if POA consents  UTI (urinary tract infection) 09/15/16 urine culture E Coli, Nitrofurantoin 100mg  bid x 7 days.   Anxiety state Hx of anxiety/Fibromyalgia, 08/17/16 tapered off Imipramine and started Lyrica, she stated she has pain in neck, hands, legs at night, but no pain upon my visit.      Family/ staff Communication: plan of care reviewed with the patient and charge nurse.   Labs/tests ordered: none  Time spend: 25 minutes

## 2016-09-20 NOTE — Assessment & Plan Note (Signed)
Hx of anxiety/Fibromyalgia, 08/17/16 tapered off Imipramine and started Lyrica, she stated she has pain in neck, hands, legs at night, but no pain upon my visit.

## 2016-09-21 ENCOUNTER — Encounter: Payer: Medicare Other | Admitting: Internal Medicine

## 2016-09-24 NOTE — Addendum Note (Signed)
Addended byBlanchie Serve on: 09/24/2016 01:59 PM   Modules accepted: Level of Service

## 2016-10-11 ENCOUNTER — Other Ambulatory Visit: Payer: Self-pay | Admitting: Nurse Practitioner

## 2016-10-29 ENCOUNTER — Encounter: Payer: Self-pay | Admitting: Internal Medicine

## 2016-10-29 ENCOUNTER — Non-Acute Institutional Stay: Payer: Medicare Other | Admitting: Internal Medicine

## 2016-10-29 DIAGNOSIS — M159 Polyosteoarthritis, unspecified: Secondary | ICD-10-CM

## 2016-10-29 DIAGNOSIS — K5901 Slow transit constipation: Secondary | ICD-10-CM | POA: Diagnosis not present

## 2016-10-29 DIAGNOSIS — M150 Primary generalized (osteo)arthritis: Secondary | ICD-10-CM

## 2016-10-29 DIAGNOSIS — R442 Other hallucinations: Secondary | ICD-10-CM

## 2016-10-29 DIAGNOSIS — R443 Hallucinations, unspecified: Secondary | ICD-10-CM | POA: Diagnosis not present

## 2016-10-29 DIAGNOSIS — M797 Fibromyalgia: Secondary | ICD-10-CM

## 2016-10-29 DIAGNOSIS — K219 Gastro-esophageal reflux disease without esophagitis: Secondary | ICD-10-CM

## 2016-10-29 NOTE — Progress Notes (Signed)
[facility]  Provider: Blanchie Serve MD   Location:  [facility] Room Number: AL-917  Place of Service:  ALF (13)  PCP: Blanchie Serve, MD Patient Care Team: Blanchie Serve, MD as PCP - General (Internal Medicine) Mast, Man X, NP as Nurse Practitioner (Internal Medicine)  Extended Emergency Contact Information Primary Emergency Contact: Culbreth,Robin Address: [address] [place], [place] [address] [place] Phone: [phone] Mobile Phone: [phone] Relation: Daughter  Goals of Care: Advanced Directive information Advanced Directives 10/29/2016  Does Patient Have a Medical Advance Directive? Yes  Type of Advance Directive Living will  Does patient want to make changes to medical advance directive? No - Patient declined  Copy of [place] in Chart? -  Would patient like information on creating a medical advance directive? -  Pre-existing out of facility DNR order (yellow form or pink MOST form) -      Chief Complaint  Patient presents with  . Medical Management of Chronic Issues    Routine Visit     HPI: Patient is a 83 y.o. King seen today for routine visit. She has noticed increased swelling to her legs. She also complaints of pain of multiple joints. Denies redness or swelling of her joints. Denies any other concerns. She is sleeping well. She gets around with her walker.   Past Medical History:  Diagnosis Date  . Acute blood loss anemia 08/23/2013   04/13/16 TSH 1.20, Na 141, K 4.2, Bun 15, creat 0.79, wbc 5.5, Hgb 11.5, plt 283  . Anxiety   . Anxiety state 07/02/2007   Qualifier: Diagnosis of  By: Lenna Gilford MD, Deborra Medina   . 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 Cigarette smoker   . Colon polyps 2009   TWO TUBULAR ADENOMAS AND HYPERPLASTIC POLYPS  . Constipation 09/14/2013  . COPD (chronic obstructive pulmonary disease) ([place])   . Diverticulosis of  colon   . DJD (degenerative joint disease)   . Dog bite of limb 07/14/2010   Dog bite 07/10/10 - dogs shots utd Last td was 08/2009  Localized tx only.    . Fibromyalgia   . GERD 12/19/2006   Qualifier: Diagnosis of  By: Julien Girt CMA, Marliss Czar  04/13/16 TSH 1.20, Na 141, K 4.2, Bun 15, creat 0.79, wbc 5.5, Hgb 11.5, plt 283   . GERD (gastroesophageal reflux disease)   . Hammer toe of second toe of left foot 04/08/2016   Left 2nd  . Hearing loss   . Hemorrhoids   . Hypercholesterolemia   . Osteoarthritis 12/19/2006   Qualifier: Diagnosis of  By: Julien Girt CMA, Marliss Czar    . Osteoporosis    Past Surgical History:  Procedure Laterality Date  . CATARACT EXTRACTION, BILATERAL  2009  . COLONOSCOPY  2009, 2006 , 2017  . stapes surgery     Dr Thornell Mule  . TONSILLECTOMY AND ADENOIDECTOMY     as a child    reports that she quit smoking about 6 years ago. Her smoking use included Cigarettes. She has never used smokeless tobacco. She reports that she drinks alcohol. She reports that she does not use drugs. Social History   Social History  . Marital status: Widowed    Spouse name: N/A  . Number of children: N/A  . Years of education: N/A   Occupational History  . retired   . works some weekends at  [employer]    Social History Main Topics  . Smoking status: Former Smoker    Types: Cigarettes    Quit date: 02/15/2010  . Smokeless tobacco: Never Used  . Alcohol use Yes     Comment: occasional glass of wine  . Drug use: No  . Sexual activity: No   Other Topics Concern  . Not on file   Social History Narrative   Pt works some weekends at [employer]   Caffeine use - daily coffee in the mornings   Patient gets regular exercise > tries to walk daily for exercise   Moved to [place] 03/11/16   Widowed   Former Smoker-stopped 2012   Alcohol occasionally glass of wine   Living Will        Family History  Problem Relation Age of Onset  . Heart disease Father   . COPD  Father   . Hypertension Mother   . Arthritis Mother     Health Maintenance  Topic Date Due  . DEXA SCAN  09/28/1998  . INFLUENZA VACCINE  11/15/2016 (Originally 09/15/2016)  . PNA vac Low Risk Adult (2 of 2 - PPSV23) 12/16/2016 (Originally 08/31/2016)  . TETANUS/TDAP  08/22/2019    Allergies  Allergen Reactions  . Penicillins Anaphylaxis  . Bisphosphonates Other (See Comments)    [name]  . Influenza Vaccines     Pt reported  . Simvastatin     Unknown   . Trazodone And Nefazodone     Felt her throat was closing up/kept her awake  . Sulfonamide Derivatives Rash and Other (See Comments)    blisters    Outpatient Encounter Prescriptions as of 10/29/2016  Medication Sig  . aspirin 81 MG tablet Take 81 mg by mouth daily.  . calcium carbonate (TUMS EX) 750 MG chewable tablet Chew 750 mg by mouth daily.   . Calcium Carbonate-Vit D-Min (CALCIUM 600+D PLUS MINERALS) 600-400 MG-UNIT TABS Take 1 tablet by mouth daily.   . cholecalciferol (VITAMIN D) 1000 UNITS tablet Take 1,000 Units by mouth daily.  . famotidine (PEPCID) 20 MG tablet TAKE ONE TABLET BY MOUTH ONCE DAILY  . hydrocortisone cream 1 % Apply 1 application topically 2 (two) times daily. Apply to wrists as needed.  . polyethylene glycol (MIRALAX / GLYCOLAX) packet Take 17 g by mouth every other day.   . pregabalin (LYRICA) 25 MG capsule Take 25 mg by mouth 2 (two) times daily. Take for 1 week, then take 25 mg twice daily for fibromyalgia.   Marland Kitchen risperiDONE (RISPERDAL) 0.25 MG tablet Take 0.25 mg by mouth at bedtime.  . [DISCONTINUED] dextromethorphan 7.5 MG/5ML SYRP Take 7.5 mg by mouth every 6 (six) hours as needed.  . [DISCONTINUED] loratadine (CLARITIN) 10 MG tablet Take 10 mg by mouth daily.   No facility-administered encounter medications on file as of 10/29/2016.     Review of Systems  Constitutional: Negative for appetite change, chills, fatigue and fever.  HENT: Negative for congestion, mouth sores, rhinorrhea,  sore throat and trouble swallowing.        Has dry mouth  Respiratory: Negative for cough and shortness of breath.   Cardiovascular: Positive for leg swelling. Negative for chest pain and palpitations.  Gastrointestinal: Positive for constipation. Negative for abdominal pain, nausea and vomiting.       Miralax helps with bowel movement  Genitourinary: Negative for difficulty urinating, dysuria and hematuria.  Musculoskeletal:       Uses rollator walker  to get around  Skin: Negative for rash.  Neurological: Positive for dizziness. Negative for seizures and headaches.       Dizzy with sudden position change  Psychiatric/Behavioral: Negative for confusion and dysphoric mood. The patient is not nervous/anxious.     Vitals:   10/29/16 1019  BP: (!) 142/80  Pulse: 80  Resp: 20  Temp: 98.2 F (36.8 C)  TempSrc: Oral  Weight: 139 lb 6.4 oz (63.2 kg)  Height: 5\' 2"  (1.575 m)   Body mass index is 25.5 kg/m.   Wt Readings from Last 3 Encounters:  10/29/16 139 lb 6.4 oz (63.2 kg)  09/20/16 137 lb 9.6 oz (62.4 kg)  09/15/16 140 lb 3.2 oz (63.6 kg)    Physical Exam  Constitutional: She is oriented to person, place, and time. She appears well-developed and well-nourished. No distress.  HENT:  Head: Normocephalic and atraumatic.  Mouth/Throat: Oropharynx is clear and moist.  Eyes: Pupils are equal, round, and reactive to light. Conjunctivae and EOM are normal.  Neck: Normal range of motion. Neck supple.  Cardiovascular: Normal rate, regular rhythm and normal heart sounds.   Pulmonary/Chest: Effort normal and breath sounds normal.  Abdominal: Soft. Bowel sounds are normal. There is no tenderness.  Musculoskeletal: She exhibits edema.  Arthritis changes to her fingers, limited ROM with shoulders, uses rolling walker, unsteady gait, can move all 4 extremities  Lymphadenopathy:    She has no cervical adenopathy.  Neurological: She is alert and oriented to person, place, and time.  Skin:  Skin is warm and dry. No rash noted. She is not diaphoretic. No erythema.  Psychiatric: She has a normal mood and affect. Her behavior is normal.    Labs reviewed: Basic Metabolic Panel:  Recent Labs  12/07/15 2047 04/13/16 09/15/16  NA 141 141 142  K 3.9 4.2 4.5  CL 105  --   --   GLUCOSE 87  --   --   BUN 16 15 14   CREATININE 1.00 0.8 0.8   Liver Function Tests:  Recent Labs  04/13/16 09/15/16  AST 16 18  ALT 12 12  ALKPHOS 91 111   No results for input(s): LIPASE, AMYLASE in the last 8760 hours. No results for input(s): AMMONIA in the last 8760 hours. CBC:  Recent Labs  12/07/15 2037 12/07/15 2047 04/13/16 09/15/16  WBC 5.5  --  5.5 9.4  NEUTROABS 3.2  --   --   --   HGB 11.6* 11.2* 11.5* 13.3  HCT 34.6* 33.0* 35* 39  MCV 92.0  --   --   --   PLT 242  --  283 243   Cardiac Enzymes: No results for input(s): CKTOTAL, CKMB, CKMBINDEX, TROPONINI in the last 8760 hours. BNP: Invalid input(s): POCBNP No results found for: HGBA1C Lab Results  Component Value Date   TSH 1.20 04/13/2016   Lab Results  Component Value Date   VITAMINB12 588 09/03/2013   No results found for: FOLATE Lab Results  Component Value Date   IRON 86 01/23/2015   FERRITIN 18.6 11/28/2013    Lipid Panel: No results for input(s): CHOL, HDL, LDLCALC, TRIG, CHOLHDL, LDLDIRECT in the last 8760 hours. No results found for: HGBA1C  Procedures since last visit: No results found.  Assessment/Plan  GERD Controlled, continue famotidine 20 mg daily and monitor  OA Multiple joint pain. Start tylenol 650 mg qhs and daily in am as needed only for now and monitor  Fibromyalgia Tolerating lyrica 25 mg bid well  for now, monitor  Chronic constipation Stable, continue miralax every other day.monitor clinically. Encouraged hydration  Tactile hallucination Much improved. Continue risperdal and monitor    Blanchie Serve, MD Internal Medicine [facility]  [facility] [address] [place], [place] [address] Cell Phone (Monday-Friday 8 am - 5 pm): [phone] On Call: [phone] and follow prompts after 5 pm and on weekends Office Phone: [phone] Office Fax: [phone]

## 2016-11-05 ENCOUNTER — Non-Acute Institutional Stay: Payer: Medicare Other

## 2016-11-05 DIAGNOSIS — Z0000 Encounter for general adult medical examination without abnormal findings: Secondary | ICD-10-CM

## 2016-11-05 NOTE — Patient Instructions (Signed)
Lisa King , Thank you for taking time to come for your Medicare Wellness Visit. I appreciate your ongoing commitment to your health goals. Please review the following plan we discussed and let me know if I can assist you in the future.   Screening recommendations/referrals: Colonoscopy excluded pt is over age 81 Mammogram excluded pt is over age 22 Bone Density due Recommended yearly ophthalmology/optometry visit for glaucoma screening and checkup Recommended yearly dental visit for hygiene and checkup  Vaccinations: Influenza vaccine due Pneumococcal vaccine up to date Tdap vaccine up to date Due 08/22/19 Shingles vaccine not in records    Advanced directives: In Chart  Conditions/risks identified: None  Next appointment: Dr. Bubba Camp makes rounds   Preventive Care 28 Years and Older, Female Preventive care refers to lifestyle choices and visits with your health care provider that can promote health and wellness. What does preventive care include?  A yearly physical exam. This is also called an annual well check.  Dental exams once or twice a year.  Routine eye exams. Ask your health care provider how often you should have your eyes checked.  Personal lifestyle choices, including:  Daily care of your teeth and gums.  Regular physical activity.  Eating a healthy diet.  Avoiding tobacco and drug use.  Limiting alcohol use.  Practicing safe sex.  Taking low-dose aspirin every day.  Taking vitamin and mineral supplements as recommended by your health care provider. What happens during an annual well check? The services and screenings done by your health care provider during your annual well check will depend on your age, overall health, lifestyle risk factors, and family history of disease. Counseling  Your health care provider may ask you questions about your:  Alcohol use.  Tobacco use.  Drug use.  Emotional well-being.  Home and relationship  well-being.  Sexual activity.  Eating habits.  History of falls.  Memory and ability to understand (cognition).  Work and work Statistician.  Reproductive health. Screening  You may have the following tests or measurements:  Height, weight, and BMI.  Blood pressure.  Lipid and cholesterol levels. These may be checked every 5 years, or more frequently if you are over 50 years old.  Skin check.  Lung cancer screening. You may have this screening every year starting at age 24 if you have a 30-pack-year history of smoking and currently smoke or have quit within the past 15 years.  Fecal occult blood test (FOBT) of the stool. You may have this test every year starting at age 30.  Flexible sigmoidoscopy or colonoscopy. You may have a sigmoidoscopy every 5 years or a colonoscopy every 10 years starting at age 48.  Hepatitis C blood test.  Hepatitis B blood test.  Sexually transmitted disease (STD) testing.  Diabetes screening. This is done by checking your blood sugar (glucose) after you have not eaten for a while (fasting). You may have this done every 1-3 years.  Bone density scan. This is done to screen for osteoporosis. You may have this done starting at age 62.  Mammogram. This may be done every 1-2 years. Talk to your health care provider about how often you should have regular mammograms. Talk with your health care provider about your test results, treatment options, and if necessary, the need for more tests. Vaccines  Your health care provider may recommend certain vaccines, such as:  Influenza vaccine. This is recommended every year.  Tetanus, diphtheria, and acellular pertussis (Tdap, Td) vaccine. You may need a  Td booster every 10 years.  Zoster vaccine. You may need this after age 86.  Pneumococcal 13-valent conjugate (PCV13) vaccine. One dose is recommended after age 70.  Pneumococcal polysaccharide (PPSV23) vaccine. One dose is recommended after age  69. Talk to your health care provider about which screenings and vaccines you need and how often you need them. This information is not intended to replace advice given to you by your health care provider. Make sure you discuss any questions you have with your health care provider. Document Released: 02/28/2015 Document Revised: 10/22/2015 Document Reviewed: 12/03/2014 Elsevier Interactive Patient Education  2017 [place] Prevention in the Home Falls can cause injuries. They can happen to people of all ages. There are many things you can do to make your home safe and to help prevent falls. What can I do on the outside of my home?  Regularly fix the edges of walkways and driveways and fix any cracks.  Remove anything that might make you trip as you walk through a door, such as a raised step or threshold.  Trim any bushes or trees on the path to your home.  Use bright outdoor lighting.  Clear any walking paths of anything that might make someone trip, such as rocks or tools.  Regularly check to see if handrails are loose or broken. Make sure that both sides of any steps have handrails.  Any raised decks and porches should have guardrails on the edges.  Have any leaves, snow, or ice cleared regularly.  Use sand or salt on walking paths during winter.  Clean up any spills in your garage right away. This includes oil or grease spills. What can I do in the bathroom?  Use night lights.  Install grab bars by the toilet and in the tub and shower. Do not use towel bars as grab bars.  Use non-skid mats or decals in the tub or shower.  If you need to sit down in the shower, use a plastic, non-slip stool.  Keep the floor dry. Clean up any water that spills on the floor as soon as it happens.  Remove soap buildup in the tub or shower regularly.  Attach bath mats securely with double-sided non-slip rug tape.  Do not have throw rugs and other things on the floor that can make  you trip. What can I do in the bedroom?  Use night lights.  Make sure that you have a light by your bed that is easy to reach.  Do not use any sheets or blankets that are too big for your bed. They should not hang down onto the floor.  Have a firm chair that has side arms. You can use this for support while you get dressed.  Do not have throw rugs and other things on the floor that can make you trip. What can I do in the kitchen?  Clean up any spills right away.  Avoid walking on wet floors.  Keep items that you use a lot in easy-to-reach places.  If you need to reach something above you, use a strong step stool that has a grab bar.  Keep electrical cords out of the way.  Do not use floor polish or wax that makes floors slippery. If you must use wax, use non-skid floor wax.  Do not have throw rugs and other things on the floor that can make you trip. What can I do with my stairs?  Do not leave any items on the stairs.  Make sure that there are handrails on both sides of the stairs and use them. Fix handrails that are broken or loose. Make sure that handrails are as long as the stairways.  Check any carpeting to make sure that it is firmly attached to the stairs. Fix any carpet that is loose or worn.  Avoid having throw rugs at the top or bottom of the stairs. If you do have throw rugs, attach them to the floor with carpet tape.  Make sure that you have a light switch at the top of the stairs and the bottom of the stairs. If you do not have them, ask someone to add them for you. What else can I do to help prevent falls?  Wear shoes that:  Do not have high heels.  Have rubber bottoms.  Are comfortable and fit you well.  Are closed at the toe. Do not wear sandals.  If you use a stepladder:  Make sure that it is fully opened. Do not climb a closed stepladder.  Make sure that both sides of the stepladder are locked into place.  Ask someone to hold it for you, if  possible.  Clearly mark and make sure that you can see:  Any grab bars or handrails.  First and last steps.  Where the edge of each step is.  Use tools that help you move around (mobility aids) if they are needed. These include:  Canes.  Walkers.  Scooters.  Crutches.  Turn on the lights when you go into a dark area. Replace any light bulbs as soon as they burn out.  Set up your furniture so you have a clear path. Avoid moving your furniture around.  If any of your floors are uneven, fix them.  If there are any pets around you, be aware of where they are.  Review your medicines with your doctor. Some medicines can make you feel dizzy. This can increase your chance of falling. Ask your doctor what other things that you can do to help prevent falls. This information is not intended to replace advice given to you by your health care provider. Make sure you discuss any questions you have with your health care provider. Document Released: 11/28/2008 Document Revised: 07/10/2015 Document Reviewed: 03/08/2014 Elsevier Interactive Patient Education  2017 [employer].

## 2016-11-05 NOTE — Progress Notes (Signed)
Subjective:   Lisa King is a 83 y.o. female who presents for an Initial Medicare Annual Wellness Visit at [facility]    Objective:    Today's Vitals   11/05/16 1006  BP: 118/60  Pulse: 68  Temp: 97.7 F (36.5 C)  TempSrc: Oral  SpO2: 97%  Weight: 138 lb (62.6 kg)  Height: 5\' 2"  (1.575 m)   Body mass index is 25.24 kg/m.   Current Medications (verified) Outpatient Encounter Prescriptions as of 11/05/2016  Medication Sig  . aspirin 81 MG tablet Take 81 mg by mouth daily.  . calcium carbonate (TUMS EX) 750 MG chewable tablet Chew 750 mg by mouth daily.   . Calcium Carbonate-Vit D-Min (CALCIUM 600+D PLUS MINERALS) 600-400 MG-UNIT TABS Take 1 tablet by mouth daily.   . cholecalciferol (VITAMIN D) 1000 UNITS tablet Take 1,000 Units by mouth daily.  . famotidine (PEPCID) 20 MG tablet TAKE ONE TABLET BY MOUTH ONCE DAILY  . hydrocortisone cream 1 % Apply 1 application topically 2 (two) times daily. Apply to wrists as needed.  . polyethylene glycol (MIRALAX / GLYCOLAX) packet Take 17 g by mouth every other day.   . pregabalin (LYRICA) 25 MG capsule Take 25 mg by mouth 2 (two) times daily. Take for 1 week, then take 25 mg twice daily for fibromyalgia.   Marland Kitchen risperiDONE (RISPERDAL) 0.25 MG tablet Take 0.25 mg by mouth at bedtime.   No facility-administered encounter medications on file as of 11/05/2016.     Allergies (verified) Penicillins; Bisphosphonates; Influenza vaccines; Simvastatin; Trazodone and nefazodone; and Sulfonamide derivatives   History: Past Medical History:  Diagnosis Date  . Acute blood loss anemia 08/23/2013   04/13/16 TSH 1.20, Na 141, K 4.2, Bun 15, creat 0.79, wbc 5.5, Hgb 11.5, plt 283  . Anxiety   . Anxiety state 07/02/2007   Qualifier: Diagnosis of  By: Lenna Gilford MD, Deborra Medina   . 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 Cigarette smoker   . Colon polyps 2009   TWO TUBULAR ADENOMAS AND HYPERPLASTIC POLYPS  .  Constipation 09/14/2013  . COPD (chronic obstructive pulmonary disease) ([place])   . Diverticulosis of colon   . DJD (degenerative joint disease)   . Dog bite of limb 07/14/2010   Dog bite 07/10/10 - dogs shots utd Last td was 08/2009  Localized tx only.    . Fibromyalgia   . GERD 12/19/2006   Qualifier: Diagnosis of  By: Julien Girt CMA, Marliss Czar  04/13/16 TSH 1.20, Na 141, K 4.2, Bun 15, creat 0.79, wbc 5.5, Hgb 11.5, plt 283   . GERD (gastroesophageal reflux disease)   . Hammer toe of second toe of left foot 04/08/2016   Left 2nd  . Hearing loss   . Hemorrhoids   . Hypercholesterolemia   . Osteoarthritis 12/19/2006   Qualifier: Diagnosis of  By: Julien Girt CMA, Marliss Czar    . Osteoporosis    Past Surgical History:  Procedure Laterality Date  . CATARACT EXTRACTION, BILATERAL  2009  . COLONOSCOPY  2009, 2006 , 2017  . stapes surgery     Dr Thornell Mule  . TONSILLECTOMY AND ADENOIDECTOMY     as a child   Family History  Problem Relation Age of Onset  . Heart disease Father   . COPD Father   . Hypertension Mother   . Arthritis Mother    Social History   Occupational History  . retired   . works some weekends at [employer]  Social History Main Topics  . Smoking status: Former Smoker    Types: Cigarettes    Quit date: 02/15/2010  . Smokeless tobacco: Never Used  . Alcohol use Yes     Comment: occasional glass of wine  . Drug use: No  . Sexual activity: No    Tobacco Counseling Counseling given: Not Answered   Activities of Daily Living In your present state of health, do you have any difficulty performing the following activities: 11/05/2016  Hearing? N  Vision? Y  Difficulty concentrating or making decisions? N  Walking or climbing stairs? Y  Dressing or bathing? N  Doing errands, shopping? Y  Preparing Food and eating ? Y  Using the Toilet? N  In the past six months, have you accidently leaked urine? Y  Do you have problems with loss of bowel control? N  Managing your  Medications? Y  Managing your Finances? Y  Housekeeping or managing your Housekeeping? Y  Some recent data might be hidden    Immunizations and Health Maintenance Immunization History  Administered Date(s) Administered  . Pneumococcal Conjugate-13 09/01/2015  . Pneumococcal Polysaccharide-23 07/07/1998  . Td 08/21/2009   Health Maintenance Due  Topic Date Due  . DEXA SCAN  09/28/1998    Patient Care Team: Blanchie Serve, MD as PCP - General (Internal Medicine) Mast, Man X, NP as Nurse Practitioner (Internal Medicine)  Indicate any recent Medical Services you may have received from other than [facility] providers in the past year (date may be approximate).     Assessment:   This is a routine wellness examination for [place].   Hearing/Vision screen No exam data present  Dietary issues and exercise activities discussed: Current Exercise Habits: Home exercise routine, Type of exercise: walking;Other - see comments (biking), Time (Minutes): 15, Frequency (Times/Week): 7, Weekly Exercise (Minutes/Week): 105, Intensity: Mild, Exercise limited by: None identified  Goals    . Increase water intake          Patient would like to increase water intake.      Depression Screen PHQ 2/9 Scores 11/05/2016 09/01/2015 08/31/2012  PHQ - 2 Score 0 0 0    Fall Risk Fall Risk  11/05/2016 09/01/2015 08/31/2012  Falls in the past year? Yes Yes No  Number falls in past yr: 2 or more 1 -  Injury with Fall? Yes No -  Risk for fall due to : - Impaired balance/gait -    Cognitive Function: MMSE - Mini Mental State Exam 11/05/2016 05/12/2016  Orientation to time 5 4  Orientation to Place 5 5  Registration 3 3  Attention/ Calculation 5 5  Recall 2 1  Language- name 2 objects 2 2  Language- repeat 1 1  Language- follow 3 step command 3 3  Language- read & follow direction 1 1  Write a sentence 1 1  Copy design 1 1  Total score 29 27        Screening Tests Health Maintenance  Topic Date Due    . DEXA SCAN  09/28/1998  . INFLUENZA VACCINE  11/15/2016 (Originally 09/15/2016)  . PNA vac Low Risk Adult (2 of 2 - PPSV23) 12/16/2016 (Originally 08/31/2016)  . TETANUS/TDAP  08/22/2019      Plan:    I have personally reviewed and addressed the Medicare Annual Wellness questionnaire and have noted the following in the patient's chart:  A. Medical and social history B. Use of alcohol, tobacco or illicit drugs  C. Current medications and supplements D. Functional  ability and status E.  Nutritional status F.  Physical activity G. Advance directives H. List of other physicians I.  Hospitalizations, surgeries, and ER visits in previous 12 months J.  [place] to include hearing, vision, cognitive, depression L. Referrals and appointments - none  In addition, I have reviewed and discussed with patient certain preventive protocols, quality metrics, and best practice recommendations. A written personalized care plan for preventive services as well as general preventive health recommendations were provided to patient.  See attached scanned questionnaire for additional information.   Signed,   Rich Reining, RN Nurse Health Advisor   Quick Notes   Health Maintenance: Flu vaccine and DEXA due     Abnormal Screen: MMSE 29/30 Did not pass clock drawing     Patient Concerns: C/o inability to fall asleep     Nurse Concerns: None

## 2016-12-03 DIAGNOSIS — M810 Age-related osteoporosis without current pathological fracture: Secondary | ICD-10-CM | POA: Diagnosis not present

## 2016-12-03 LAB — HM DEXA SCAN

## 2016-12-21 DIAGNOSIS — Z87891 Personal history of nicotine dependence: Secondary | ICD-10-CM | POA: Diagnosis not present

## 2016-12-21 DIAGNOSIS — Q2730 Arteriovenous malformation, site unspecified: Secondary | ICD-10-CM | POA: Diagnosis not present

## 2016-12-21 DIAGNOSIS — I671 Cerebral aneurysm, nonruptured: Secondary | ICD-10-CM | POA: Diagnosis not present

## 2016-12-21 DIAGNOSIS — H0520 Unspecified exophthalmos: Secondary | ICD-10-CM | POA: Diagnosis not present

## 2016-12-21 DIAGNOSIS — Z9889 Other specified postprocedural states: Secondary | ICD-10-CM | POA: Diagnosis not present

## 2016-12-28 ENCOUNTER — Non-Acute Institutional Stay: Payer: Medicare Other | Admitting: Nurse Practitioner

## 2016-12-28 DIAGNOSIS — M62838 Other muscle spasm: Secondary | ICD-10-CM | POA: Diagnosis not present

## 2016-12-28 DIAGNOSIS — K219 Gastro-esophageal reflux disease without esophagitis: Secondary | ICD-10-CM | POA: Diagnosis not present

## 2016-12-28 DIAGNOSIS — K5900 Constipation, unspecified: Secondary | ICD-10-CM

## 2016-12-28 DIAGNOSIS — R197 Diarrhea, unspecified: Secondary | ICD-10-CM | POA: Diagnosis not present

## 2016-12-28 NOTE — Assessment & Plan Note (Signed)
Stable, no further diarrhea, continue MiraLax qod.

## 2016-12-28 NOTE — Progress Notes (Signed)
Location:  [place] Room Number: 426 Place of Service:  ALF [phone]) Provider:  Baird Polinski, [place]  NP  Blanchie Serve, MD  Patient Care Team: Blanchie Serve, MD as PCP - General (Internal Medicine) Jeptha Hinnenkamp X, NP as Nurse Practitioner (Internal Medicine)  Extended Emergency Contact Information Primary Emergency Contact: Culbreth,Robin Address: [address] [place], [place] [address] [place] Phone: [phone] Mobile Phone: [phone] Relation: Daughter  Code Status:  DNR Goals of care: Advanced Directive information Advanced Directives 11/05/2016  Does Patient Have a Medical Advance Directive? Yes  Type of Advance Directive Living will  Does patient want to make changes to medical advance directive? No - Patient declined  Copy of [place] in Chart? -  Would patient like information on creating a medical advance directive? -  Pre-existing out of facility DNR order (yellow form or pink MOST form) -     Chief Complaint  Patient presents with  . Acute Visit    HPI:  Pt is a 84 y.o. female seen today for an acute visit for 12/24/16 and 111/10/18 reported loose stools, nausea, 1x vomiting, afebrile, resolved 12/27/16, her appetite remains poor, denied abd pain, heartburns, she is afebrile. History of GERD, stable, taking Famotidine daily. Constipation is stable on MiraLax qod. She stated she has muscle spasm in her neck, muscle relaxer worked and helps sleep for her in the past,  she likes to resume it.    Past Medical History:  Diagnosis Date  . Acute blood loss anemia 08/23/2013   04/13/16 TSH 1.20, Na 141, K 4.2, Bun 15, creat 0.79, wbc 5.5, Hgb 11.5, plt 283  . Anxiety   . Anxiety state 07/02/2007   Qualifier: Diagnosis of  By: Lenna Gilford MD, Deborra Medina   . 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 Cigarette smoker   . Colon polyps 2009   TWO TUBULAR ADENOMAS AND HYPERPLASTIC POLYPS  .  Constipation 09/14/2013  . COPD (chronic obstructive pulmonary disease) ([place])   . Diverticulosis of colon   . DJD (degenerative joint disease)   . Dog bite of limb 07/14/2010   Dog bite 07/10/10 - dogs shots utd Last td was 08/2009  Localized tx only.    . Fibromyalgia   . GERD 12/19/2006   Qualifier: Diagnosis of  By: Julien Girt CMA, Marliss Czar  04/13/16 TSH 1.20, Na 141, K 4.2, Bun 15, creat 0.79, wbc 5.5, Hgb 11.5, plt 283   . GERD (gastroesophageal reflux disease)   . Hammer toe of second toe of left foot 04/08/2016   Left 2nd  . Hearing loss   . Hemorrhoids   . Hypercholesterolemia   . Osteoarthritis 12/19/2006   Qualifier: Diagnosis of  By: Julien Girt CMA, Marliss Czar    . Osteoporosis    Past Surgical History:  Procedure Laterality Date  . CATARACT EXTRACTION, BILATERAL  2009  . COLONOSCOPY  2009, 2006 , 2017  . stapes surgery     Dr Thornell Mule  . TONSILLECTOMY AND ADENOIDECTOMY     as a child    Allergies  Allergen Reactions  . Penicillins Anaphylaxis  . Bisphosphonates Other (See Comments)    [name]  . Influenza Vaccines     Pt reported  . Simvastatin     Unknown   . Trazodone And Nefazodone     Felt her throat was closing up/kept her awake  . Sulfonamide Derivatives Rash and Other (  See Comments)    blisters    Outpatient Encounter Medications as of 12/28/2016  Medication Sig  . acetaminophen (TYLENOL) 325 MG tablet Take 650 mg daily by mouth.  Marland Kitchen aspirin 81 MG tablet Take 81 mg by mouth daily.  . calcium carbonate (TUMS EX) 750 MG chewable tablet Chew 750 mg by mouth daily.   . Calcium Carbonate-Vit D-Min (CALCIUM 600+D PLUS MINERALS) 600-400 MG-UNIT TABS Take 1 tablet by mouth daily.   . cholecalciferol (VITAMIN D) 1000 UNITS tablet Take 1,000 Units by mouth daily.  . famotidine (PEPCID) 20 MG tablet TAKE ONE TABLET BY MOUTH ONCE DAILY  . hydrocortisone cream 1 % Apply 1 application topically 2 (two) times daily. Apply to wrists as needed.  . polyethylene glycol (MIRALAX /  GLYCOLAX) packet Take 17 g by mouth every other day.   . pregabalin (LYRICA) 25 MG capsule Take 25 mg by mouth 2 (two) times daily. Take for 1 week, then take 25 mg twice daily for fibromyalgia.   Marland Kitchen risperiDONE (RISPERDAL) 0.25 MG tablet Take 0.25 mg by mouth at bedtime.   No facility-administered encounter medications on file as of 12/28/2016.     Review of Systems  Constitutional: Positive for appetite change and fatigue. Negative for activity change, chills, diaphoresis and fever.  Gastrointestinal: Negative for abdominal distention, abdominal pain, constipation, diarrhea, nausea and vomiting.       Resolved nausea, vomiting, and diarrhea   Endocrine: Negative for cold intolerance.  Genitourinary: Negative for difficulty urinating and dysuria.  Musculoskeletal: Positive for gait problem.       Muscle spasm in neck, keeps her awake at night.   Skin: Negative for pallor and rash.  Neurological: Negative for dizziness, tremors and weakness.  Psychiatric/Behavioral: Positive for confusion and sleep disturbance. Negative for agitation, behavioral problems and hallucinations.    Immunization History  Administered Date(s) Administered  . Pneumococcal Conjugate-13 09/01/2015  . Pneumococcal Polysaccharide-23 07/07/1998  . Td 08/21/2009   Pertinent  Health Maintenance Due  Topic Date Due  . DEXA SCAN  09/28/1998  . PNA vac Low Risk Adult (2 of 2 - PPSV23) 08/31/2016  . INFLUENZA VACCINE  09/15/2016   Fall Risk  11/05/2016 09/01/2015 08/31/2012  Falls in the past year? Yes Yes No  Number falls in past yr: 2 or more 1 -  Injury with Fall? Yes No -  Risk for fall due to : - Impaired balance/gait -   Functional Status Survey:    There were no vitals filed for this visit. There is no height or weight on file to calculate BMI. Physical Exam  Constitutional: She appears well-developed and well-nourished. No distress.  HENT:  Head: Normocephalic and atraumatic.  Neck: Normal range of  motion. Neck supple. No JVD present. No thyromegaly present.  Cardiovascular: Normal rate and regular rhythm.  No murmur heard. Pulmonary/Chest: She has no wheezes. She has no rales.  Abdominal: Soft. Bowel sounds are normal. She exhibits no distension. There is no tenderness. There is no rebound and no guarding.  Musculoskeletal: Normal range of motion. She exhibits no edema or tenderness.  Ambulates with walker.   Lymphadenopathy:    She has no cervical adenopathy.  Neurological: She is alert. Coordination normal.  Oriented to person and place.   Skin: She is not diaphoretic.  Psychiatric: She has a normal mood and affect. Her behavior is normal.    Labs reviewed: Recent Labs    04/13/16 09/15/16  NA 141 142  K 4.2 4.5  BUN 15 14  CREATININE 0.8 0.8   Recent Labs    04/13/16 09/15/16  AST 16 18  ALT 12 12  ALKPHOS 91 111   Recent Labs    04/13/16 09/15/16  WBC 5.5 9.4  HGB 11.5* 13.3  HCT 35* 39  PLT 283 243   Lab Results  Component Value Date   TSH 1.20 04/13/2016   No results found for: HGBA1C Lab Results  Component Value Date   CHOL 186 01/23/2015   HDL 100.20 01/23/2015   LDLCALC 66 01/23/2015   LDLDIRECT 114.7 08/18/2011   TRIG 101.0 01/23/2015   CHOLHDL 2 01/23/2015    Significant Diagnostic Results in last 30 days:  No results found.  Assessment/Plan 1. Diarrhea, unspecified type   2. Gastroesophageal reflux disease without esophagitis   3. Constipation, unspecified constipation type   4. Neck muscle spasm     Family/ staff Communication:   Labs/tests ordered:

## 2016-12-28 NOTE — Assessment & Plan Note (Addendum)
Keeps her awake at night, said she sleeps every other night because she sleeps when she sleeps when she is tired out. will try Flexeril 5mg  po qhs

## 2016-12-28 NOTE — Assessment & Plan Note (Signed)
Stable, continue Famotidine.  

## 2016-12-28 NOTE — Assessment & Plan Note (Addendum)
12/24/16 and 111/10/18 reported loose stools, nausea, 1x vomiting, afebrile, resolved 12/27/16, her appetite remains poor, denied abd pain, heartburns, she is afebrile. Update CBC/diff and CMP

## 2016-12-30 DIAGNOSIS — D649 Anemia, unspecified: Secondary | ICD-10-CM | POA: Diagnosis not present

## 2016-12-30 DIAGNOSIS — M6281 Muscle weakness (generalized): Secondary | ICD-10-CM | POA: Diagnosis not present

## 2016-12-30 LAB — CBC AND DIFFERENTIAL
HCT: 34 — AB (ref 36–46)
HEMOGLOBIN: 11.7 — AB (ref 12.0–16.0)
Platelets: 247 (ref 150–399)
WBC: 4.7

## 2016-12-30 LAB — BASIC METABOLIC PANEL
BUN: 21 (ref 4–21)
Creatinine: 0.8 (ref <=1.1)
GLUCOSE: 88
Potassium: 4.1 (ref 3.4–5.3)
SODIUM: 140 (ref 137–147)

## 2016-12-30 LAB — HEPATIC FUNCTION PANEL
ALK PHOS: 71 (ref 25–125)
ALT: 9 (ref 7–35)
AST: 15 (ref 13–35)
BILIRUBIN, TOTAL: 0.4

## 2016-12-31 ENCOUNTER — Other Ambulatory Visit: Payer: Self-pay | Admitting: *Deleted

## 2017-01-11 ENCOUNTER — Non-Acute Institutional Stay: Payer: Medicare Other | Admitting: Nurse Practitioner

## 2017-01-11 ENCOUNTER — Encounter: Payer: Self-pay | Admitting: Nurse Practitioner

## 2017-01-11 DIAGNOSIS — R635 Abnormal weight gain: Secondary | ICD-10-CM | POA: Diagnosis not present

## 2017-01-11 DIAGNOSIS — M797 Fibromyalgia: Secondary | ICD-10-CM | POA: Diagnosis not present

## 2017-01-11 DIAGNOSIS — M810 Age-related osteoporosis without current pathological fracture: Secondary | ICD-10-CM

## 2017-01-11 NOTE — Progress Notes (Signed)
Location:  [facility] Room Number: 438 Place of Service:  ALF [phone]) Provider:  Lupe Handley, Manxie  NP  Blanchie Serve, MD  Patient Care Team: Blanchie Serve, MD as PCP - General (Internal Medicine) Cori Henningsen X, NP as Nurse Practitioner (Internal Medicine)  Extended Emergency Contact Information Primary Emergency Contact: Culbreth,Robin Address: [address] [place], [place] [address] [place] Phone: [phone] Mobile Phone: [phone] Relation: Daughter  Code Status:  Full Code Goals of care: Advanced Directive information Advanced Directives 11/05/2016  Does Patient Have a Medical Advance Directive? Yes  Type of Advance Directive Living will  Does patient want to make changes to medical advance directive? No - Patient declined  Copy of [place] in Chart? -  Would patient like information on creating a medical advance directive? -  Pre-existing out of facility DNR order (yellow form or pink MOST form) -     Chief Complaint  Patient presents with  . Acute Visit    C/o of pain and lower leg edema    HPI:  Pt is a 83 y.o. female seen today for an acute visit for c/o pain in the right and left lower legs, swelling in legs. The nature of pain is on and off, chronic, aching, better once she falls asleep, denied tingling or numbness associated with the pain. She takes Flexeril 5mg  qhs, Lyrica 25mg  bid, Tylenol 650mg  daily.  No apparent swelling in legs noted upon my visit today, she has gained weight gradually since 04/2016, monthly weights from March: #127Ibs, #128 Ibs, #134 Ibs, #134 Ibs, #139 Ibs, #140Ibs, #138 Ibs, #139 Ibs, #141Ibs, #146Ibs. DEXA 12/03/16 showed t-score -2.8, osteoporosis, the patient only agreed with Vit D and Ca supplement for now.    Past Medical History:  Diagnosis Date  . Acute blood loss anemia 08/23/2013   04/13/16 TSH 1.20, Na 141, K 4.2, Bun 15, creat 0.79, wbc 5.5, Hgb 11.5,  plt 283  . Anxiety   . Anxiety state 07/02/2007   Qualifier: Diagnosis of  By: Lenna Gilford MD, Deborra Medina   . 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 Cigarette smoker   . Colon polyps 2009   TWO TUBULAR ADENOMAS AND HYPERPLASTIC POLYPS  . Constipation 09/14/2013  . COPD (chronic obstructive pulmonary disease) ([facility])   . Diverticulosis of colon   . DJD (degenerative joint disease)   . Dog bite of limb 07/14/2010   Dog bite 07/10/10 - dogs shots utd Last td was 08/2009  Localized tx only.    . Fibromyalgia   . GERD 12/19/2006   Qualifier: Diagnosis of  By: Julien Girt CMA, Marliss Czar  04/13/16 TSH 1.20, Na 141, K 4.2, Bun 15, creat 0.79, wbc 5.5, Hgb 11.5, plt 283   . GERD (gastroesophageal reflux disease)   . Hammer toe of second toe of left foot 04/08/2016   Left 2nd  . Hearing loss   . Hemorrhoids   . Hypercholesterolemia   . Osteoarthritis 12/19/2006   Qualifier: Diagnosis of  By: Julien Girt CMA, Marliss Czar    . Osteoporosis    Past Surgical History:  Procedure Laterality Date  . CATARACT EXTRACTION, BILATERAL  2009  . COLONOSCOPY  2009, 2006 , 2017  . stapes surgery     Dr Thornell Mule  . TONSILLECTOMY AND ADENOIDECTOMY     as a child    Allergies  Allergen Reactions  . Penicillins Anaphylaxis  . Bisphosphonates Other (See  Comments)    [name]  . Influenza Vaccines     Pt reported  . Simvastatin     Unknown   . Trazodone And Nefazodone     Felt her throat was closing up/kept her awake  . Sulfonamide Derivatives Rash and Other (See Comments)    blisters    Outpatient Encounter Medications as of 01/11/2017  Medication Sig  . acetaminophen (TYLENOL) 325 MG tablet Take 650 mg daily by mouth.  Marland Kitchen aspirin 81 MG tablet Take 81 mg by mouth daily.  . calcium carbonate (TUMS EX) 750 MG chewable tablet Chew 750 mg by mouth daily.   . Calcium Carbonate-Vit D-Min (CALCIUM 600+D PLUS MINERALS) 600-400 MG-UNIT TABS Take 1 tablet by mouth daily.   . cholecalciferol (VITAMIN D) 1000 UNITS tablet  Take 1,000 Units by mouth daily.  . cyclobenzaprine (FLEXERIL) 5 MG tablet Take 5 mg by mouth at bedtime.  . famotidine (PEPCID) 20 MG tablet TAKE ONE TABLET BY MOUTH ONCE DAILY  . hydrocortisone cream 1 % Apply 1 application topically 2 (two) times daily. Apply to wrists as needed.  . polyethylene glycol (MIRALAX / GLYCOLAX) packet Take 17 g by mouth every other day.   . pregabalin (LYRICA) 25 MG capsule Take 25 mg by mouth 2 (two) times daily. Take for 1 week, then take 25 mg twice daily for fibromyalgia.   Marland Kitchen risperiDONE (RISPERDAL) 0.25 MG tablet Take 0.25 mg by mouth at bedtime.   No facility-administered encounter medications on file as of 01/11/2017.     Review of Systems  Constitutional: Negative for activity change, appetite change, chills, diaphoresis, fatigue and fever.       Weight gained about #20Ibs in the past 10 months.   HENT: Positive for hearing loss. Negative for congestion, trouble swallowing and voice change.   Eyes: Negative for visual disturbance.  Respiratory: Negative for cough, choking, shortness of breath and wheezing.   Cardiovascular: Negative for chest pain, palpitations and leg swelling.  Gastrointestinal: Negative for abdominal distention and abdominal pain.  Endocrine: Negative for cold intolerance.  Genitourinary: Negative for difficulty urinating, dysuria, frequency and urgency.  Musculoskeletal: Positive for arthralgias and myalgias. Negative for back pain, gait problem and joint swelling.       Pain in R+L lower legs, sometimes in arms.   Skin: Negative for color change, pallor, rash and wound.  Neurological: Negative for tremors, speech difficulty, weakness and headaches.  Psychiatric/Behavioral: Positive for sleep disturbance. Negative for agitation, behavioral problems, confusion and hallucinations. The patient is nervous/anxious.        Long standing issue, sleeps every other night.     Immunization History  Administered Date(s) Administered  .  Pneumococcal Conjugate-13 09/01/2015  . Pneumococcal Polysaccharide-23 07/07/1998  . Td 08/21/2009   Pertinent  Health Maintenance Due  Topic Date Due  . PNA vac Low Risk Adult (2 of 2 - PPSV23) 08/31/2016  . DEXA SCAN  Completed  . INFLUENZA VACCINE  Discontinued   Fall Risk  11/05/2016 09/01/2015 08/31/2012  Falls in the past year? Yes Yes No  Number falls in past yr: 2 or more 1 -  Injury with Fall? Yes No -  Risk for fall due to : - Impaired balance/gait -   Functional Status Survey:    Vitals:   01/11/17 1050  BP: (!) 148/80  Pulse: 76  Resp: 18  Temp: 98.7 F (37.1 C)  SpO2: 95%  Weight: 143 lb 3.2 oz (65 kg)  Height: 5\' 2"  (  1.575 m)   Body mass index is 26.19 kg/m. Physical Exam  Constitutional: She is oriented to person, place, and time. She appears well-developed and well-nourished.  HENT:  Head: Normocephalic and atraumatic.  Eyes: Conjunctivae and EOM are normal. Pupils are equal, round, and reactive to light.  Neck: Normal range of motion. Neck supple. No thyromegaly present.  Cardiovascular: Normal rate, regular rhythm, normal heart sounds and intact distal pulses.  No murmur heard. Pulmonary/Chest: Effort normal and breath sounds normal. She has no wheezes. She has no rales.  Abdominal: Soft. Bowel sounds are normal. She exhibits no distension. There is no tenderness.  Musculoskeletal: Normal range of motion. She exhibits no edema or tenderness.  The left 2nd hammer toe  Neurological: She is alert and oriented to person, place, and time. Coordination normal.  Skin: Skin is warm and dry. No rash noted. No erythema. No pallor.  Varicose veins in foot and lower legs near ankles.   Psychiatric: She has a normal mood and affect. Her behavior is normal.    Labs reviewed: Recent Labs    04/13/16 09/15/16 12/30/16  NA 141 142 140  K 4.2 4.5 4.1  BUN 15 14 21   CREATININE 0.8 0.8 0.8   Recent Labs    04/13/16 09/15/16 12/30/16  AST 16 18 15   ALT 12 12 9     ALKPHOS 91 111 71   Recent Labs    04/13/16 09/15/16 12/30/16  WBC 5.5 9.4 4.7  HGB 11.5* 13.3 11.7*  HCT 35* 39 34*  PLT 283 243 247   Lab Results  Component Value Date   TSH 1.20 04/13/2016   No results found for: HGBA1C Lab Results  Component Value Date   CHOL 186 01/23/2015   HDL 100.20 01/23/2015   LDLCALC 66 01/23/2015   LDLDIRECT 114.7 08/18/2011   TRIG 101.0 01/23/2015   CHOLHDL 2 01/23/2015    Significant Diagnostic Results in last 30 days:  No results found.  Assessment/Plan Weight gain she has gained weight gradually since 04/2016, monthly weights from March: #127Ibs, #128 Ibs, #134 Ibs, #134 Ibs, #139 Ibs, #140Ibs, #138 Ibs, #139 Ibs, #141Ibs, #146Ibs.Continue to monitor weight monthly, update TSH  Osteoporosis EXA 12/03/16 showed t-score -2.8, osteoporosis, the patient only agreed with Vit D and Ca supplement for now.     Fibromyalgia pain in the right and left lower legs, swelling in legs. The nature of pain is on and off, chronic, aching, better once she falls asleep, denied tingling or numbness associated with the pain. She takes Flexeril 5mg  qhs, Lyrica 25mg  bid, Tylenol 650mg  daily.  Will add Tylenol 650mg  hs, observe.      Family/ staff Communication: plan of care reviewed with the patient and charge nurse.   Labs/tests ordered:  TSH  Time spend 25 minutes.

## 2017-01-11 NOTE — Assessment & Plan Note (Signed)
EXA 12/03/16 showed t-score -2.8, osteoporosis, the patient only agreed with Vit D and Ca supplement for now.

## 2017-01-11 NOTE — Assessment & Plan Note (Signed)
pain in the right and left lower legs, swelling in legs. The nature of pain is on and off, chronic, aching, better once she falls asleep, denied tingling or numbness associated with the pain. She takes Flexeril 5mg  qhs, Lyrica 25mg  bid, Tylenol 650mg  daily.  Will add Tylenol 650mg  hs, observe.

## 2017-01-11 NOTE — Assessment & Plan Note (Addendum)
she has gained weight gradually since 04/2016, monthly weights from March: #127Ibs, #128 Ibs, #134 Ibs, #134 Ibs, #139 Ibs, #140Ibs, #138 Ibs, #139 Ibs, #141Ibs, #146Ibs.Continue to monitor weight monthly, update TSH

## 2017-01-13 DIAGNOSIS — R946 Abnormal results of thyroid function studies: Secondary | ICD-10-CM | POA: Diagnosis not present

## 2017-01-13 LAB — TSH: TSH: 2.32 (ref <=5.90)

## 2017-01-14 ENCOUNTER — Other Ambulatory Visit: Payer: Self-pay | Admitting: *Deleted

## 2017-01-21 ENCOUNTER — Non-Acute Institutional Stay: Payer: Medicare Other | Admitting: Nurse Practitioner

## 2017-01-21 ENCOUNTER — Encounter: Payer: Self-pay | Admitting: Nurse Practitioner

## 2017-01-21 DIAGNOSIS — M797 Fibromyalgia: Secondary | ICD-10-CM

## 2017-01-21 DIAGNOSIS — R443 Hallucinations, unspecified: Secondary | ICD-10-CM | POA: Diagnosis not present

## 2017-01-21 DIAGNOSIS — K219 Gastro-esophageal reflux disease without esophagitis: Secondary | ICD-10-CM | POA: Diagnosis not present

## 2017-01-21 DIAGNOSIS — M62838 Other muscle spasm: Secondary | ICD-10-CM

## 2017-01-21 DIAGNOSIS — R442 Other hallucinations: Secondary | ICD-10-CM

## 2017-01-21 DIAGNOSIS — K5900 Constipation, unspecified: Secondary | ICD-10-CM | POA: Diagnosis not present

## 2017-01-21 NOTE — Assessment & Plan Note (Signed)
Stable, continue MiraLax po qod

## 2017-01-21 NOTE — Assessment & Plan Note (Signed)
dementia, hallucination of body sensation, she resides in [place], ambulates with walker, stable, continue Risperdal 0.25mg  qhs

## 2017-01-21 NOTE — Assessment & Plan Note (Addendum)
GERD stable, better after Tylenol was held, continue Famotidine 20mg  qd

## 2017-01-21 NOTE — Assessment & Plan Note (Addendum)
neck muscle spasm/pain, controlled, continue Flexeril 5mg  qhs, dc Tylenol 650mg  bid

## 2017-01-21 NOTE — Assessment & Plan Note (Signed)
Fibromyalgia is managed, continue Pregabalin 25mg  bid po

## 2017-01-21 NOTE — Progress Notes (Signed)
Location:  [facility] Room Number: 737 Place of Service:  ALF [phone]) Provider:  Jamauri Kruzel, Manxie NP  Blanchie Serve, MD  Patient Care Team: Blanchie Serve, MD as PCP - General (Internal Medicine) Angell Pincock X, NP as Nurse Practitioner (Internal Medicine)  Extended Emergency Contact Information Primary Emergency Contact: Culbreth,Robin Address: [address] [place], [place] [address] [place] Phone: [phone] Mobile Phone: [phone] Relation: Daughter  Code Status:  Full Code Goals of care: Advanced Directive information Advanced Directives 01/21/2017  Does Patient Have a Medical Advance Directive? No  Type of Advance Directive -  Does patient want to make changes to medical advance directive? -  Copy of [place] in Chart? -  Would patient like information on creating a medical advance directive? No - Patient declined  Pre-existing out of facility DNR order (yellow form or pink MOST form) -     Chief Complaint  Patient presents with  . Medical Management of Chronic Issues    HPI:  Pt is a 83 y.o. female seen today for medical management of chronic diseases.     The patient has history of dementia, hallucination of body sensation, she resides in AL, ambulates with walker, stable while on Risperdal 0.25mg  qhs,  neck muscle spasm/pain, controlled on Tylenol 650mg  bid and Flexeril 5mg  qhs. Fibromyalgia is managed with Pregabalin 25mg  bid, No constipation, taking MiraLax qod, GERD stable on Famotidine 20mg  qd   Past Medical History:  Diagnosis Date  . Acute blood loss anemia 08/23/2013   04/13/16 TSH 1.20, Na 141, K 4.2, Bun 15, creat 0.79, wbc 5.5, Hgb 11.5, plt 283  . Anxiety   . Anxiety state 07/02/2007   Qualifier: Diagnosis of  By: Lenna Gilford MD, Deborra Medina   . 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 Cigarette smoker   . Colon polyps 2009   TWO TUBULAR ADENOMAS AND HYPERPLASTIC POLYPS  .  Constipation 09/14/2013  . COPD (chronic obstructive pulmonary disease) ([facility])   . Diverticulosis of colon   . DJD (degenerative joint disease)   . Dog bite of limb 07/14/2010   Dog bite 07/10/10 - dogs shots utd Last td was 08/2009  Localized tx only.    . Fibromyalgia   . GERD 12/19/2006   Qualifier: Diagnosis of  By: Julien Girt CMA, Marliss Czar  04/13/16 TSH 1.20, Na 141, K 4.2, Bun 15, creat 0.79, wbc 5.5, Hgb 11.5, plt 283   . GERD (gastroesophageal reflux disease)   . Hammer toe of second toe of left foot 04/08/2016   Left 2nd  . Hearing loss   . Hemorrhoids   . Hypercholesterolemia   . Osteoarthritis 12/19/2006   Qualifier: Diagnosis of  By: Julien Girt CMA, Marliss Czar    . Osteoporosis    Past Surgical History:  Procedure Laterality Date  . CATARACT EXTRACTION, BILATERAL  2009  . COLONOSCOPY  2009, 2006 , 2017  . stapes surgery     Dr Thornell Mule  . TONSILLECTOMY AND ADENOIDECTOMY     as a child    Allergies  Allergen Reactions  . Penicillins Anaphylaxis  . Bisphosphonates Other (See Comments)    [name]  . Influenza Vaccines     Pt reported  . Simvastatin     Unknown   . Trazodone And Nefazodone     Felt her throat was closing up/kept her awake  . Sulfonamide Derivatives Rash and Other (See Comments)  blisters    Outpatient Encounter Medications as of 01/21/2017  Medication Sig  . acetaminophen (TYLENOL) 325 MG tablet Take 650 mg daily by mouth.  Marland Kitchen aspirin 81 MG tablet Take 81 mg by mouth daily.  . calcium carbonate (TUMS EX) 750 MG chewable tablet Chew 750 mg by mouth daily.   . Calcium Carbonate-Vit D-Min (CALCIUM 600+D PLUS MINERALS) 600-400 MG-UNIT TABS Take 1 tablet by mouth daily.   . cholecalciferol (VITAMIN D) 1000 UNITS tablet Take 1,000 Units by mouth daily.  . cyclobenzaprine (FLEXERIL) 5 MG tablet Take 5 mg by mouth at bedtime.  . famotidine (PEPCID) 20 MG tablet TAKE ONE TABLET BY MOUTH ONCE DAILY  . hydrocortisone cream 1 % Apply 1 application topically 2 (two) times  daily. Apply to wrists as needed.  . polyethylene glycol (MIRALAX / GLYCOLAX) packet Take 17 g by mouth every other day.   . pregabalin (LYRICA) 25 MG capsule Take 25 mg by mouth 2 (two) times daily. Take for 1 week, then take 25 mg twice daily for fibromyalgia.   Marland Kitchen risperiDONE (RISPERDAL) 0.25 MG tablet Take 0.25 mg by mouth at bedtime.   No facility-administered encounter medications on file as of 01/21/2017.     Review of Systems  Constitutional: Negative for activity change, appetite change, chills, diaphoresis, fatigue and fever.  HENT: Positive for hearing loss. Negative for congestion, trouble swallowing and voice change.   Eyes: Negative for visual disturbance.  Respiratory: Negative for cough, choking, chest tightness, shortness of breath and wheezing.   Cardiovascular: Positive for leg swelling. Negative for chest pain and palpitations.  Gastrointestinal: Negative for abdominal distention, abdominal pain, constipation, diarrhea, nausea and vomiting.       No longer constipated while on MiraLax.   Endocrine: Negative for cold intolerance.  Genitourinary: Negative for difficulty urinating, dysuria and urgency.  Musculoskeletal: Positive for gait problem, myalgias and neck pain. Negative for back pain, joint swelling and neck stiffness.       Better in neck pain/muscle spasm since Flexeril  Skin: Negative for color change, pallor, rash and wound.  Neurological: Negative for dizziness, tremors, speech difficulty, weakness and headaches.  Psychiatric/Behavioral: Positive for confusion. Negative for agitation, behavioral problems, hallucinations and sleep disturbance. The patient is not nervous/anxious.        Sleeps better with Flexeril    Immunization History  Administered Date(s) Administered  . Pneumococcal Conjugate-13 09/01/2015  . Pneumococcal Polysaccharide-23 07/07/1998  . Td 08/21/2009   Pertinent  Health Maintenance Due  Topic Date Due  . PNA vac Low Risk Adult (2 of 2  - PPSV23) 08/31/2016  . DEXA SCAN  Completed  . INFLUENZA VACCINE  Discontinued   Fall Risk  11/05/2016 09/01/2015 08/31/2012  Falls in the past year? Yes Yes No  Number falls in past yr: 2 or more 1 -  Injury with Fall? Yes No -  Risk for fall due to : - Impaired balance/gait -   Functional Status Survey:    Vitals:   01/21/17 1107  BP: 140/72  Pulse: 80  Resp: 20  Temp: 98 F (36.7 C)  Weight: 145 lb 6.4 oz (66 kg)  Height: 5\' 2"  (1.575 m)   Body mass index is 26.59 kg/m. Physical Exam  Constitutional: She appears well-developed and well-nourished. No distress.  HENT:  Head: Normocephalic and atraumatic.  Eyes: Conjunctivae and EOM are normal.  Neck: Normal range of motion. Neck supple. No JVD present. No thyromegaly present.  Cardiovascular: Normal rate and regular rhythm.  No murmur heard. Pulmonary/Chest: Effort normal and breath sounds normal. She has no wheezes. She has no rales.  Decreased air entry  Abdominal: Soft. Bowel sounds are normal. She exhibits no distension. There is no tenderness.  Musculoskeletal: Normal range of motion. She exhibits edema. She exhibits no tenderness.  Trace edema in ankles  Neurological: She is alert. She exhibits normal muscle tone. Coordination normal.  Oriented to person and place.   Skin: Skin is warm and dry. No rash noted. She is not diaphoretic. No erythema.  Psychiatric: She has a normal mood and affect. Her behavior is normal.    Labs reviewed: Recent Labs    04/13/16 09/15/16 12/30/16  NA 141 142 140  K 4.2 4.5 4.1  BUN 15 14 21   CREATININE 0.8 0.8 0.8   Recent Labs    04/13/16 09/15/16 12/30/16  AST 16 18 15   ALT 12 12 9   ALKPHOS 91 111 71   Recent Labs    04/13/16 09/15/16 12/30/16  WBC 5.5 9.4 4.7  HGB 11.5* 13.3 11.7*  HCT 35* 39 34*  PLT 283 243 247   Lab Results  Component Value Date   TSH 2.32 01/13/2017   No results found for: HGBA1C Lab Results  Component Value Date   CHOL 186 01/23/2015    HDL 100.20 01/23/2015   LDLCALC 66 01/23/2015   LDLDIRECT 114.7 08/18/2011   TRIG 101.0 01/23/2015   CHOLHDL 2 01/23/2015    Significant Diagnostic Results in last 30 days:  No results found.  Assessment/Plan GERD  GERD stable, better after Tylenol was held, continue Famotidine 20mg  qd   Fibromyalgia Fibromyalgia is managed, continue Pregabalin 25mg  bid po  Constipation Stable, continue MiraLax po qod  Neck muscle spasm neck muscle spasm/pain, controlled, continue Flexeril 5mg  qhs, dc Tylenol 650mg  bid  Hallucination of body sensation dementia, hallucination of body sensation, she resides in AL, ambulates with walker, stable, continue Risperdal 0.25mg  qhs     Family/ staff Communication: plan of care reviewed with the patient and charge nurse.   Labs/tests ordered:  none  Time spend 25 minutes.

## 2017-02-17 ENCOUNTER — Non-Acute Institutional Stay: Payer: Medicare Other | Admitting: Nurse Practitioner

## 2017-02-17 ENCOUNTER — Encounter: Payer: Self-pay | Admitting: Nurse Practitioner

## 2017-02-17 DIAGNOSIS — K5900 Constipation, unspecified: Secondary | ICD-10-CM

## 2017-02-17 DIAGNOSIS — L309 Dermatitis, unspecified: Secondary | ICD-10-CM

## 2017-02-17 DIAGNOSIS — Z79899 Other long term (current) drug therapy: Secondary | ICD-10-CM | POA: Diagnosis not present

## 2017-02-17 DIAGNOSIS — L219 Seborrheic dermatitis, unspecified: Secondary | ICD-10-CM | POA: Insufficient documentation

## 2017-02-17 DIAGNOSIS — K219 Gastro-esophageal reflux disease without esophagitis: Secondary | ICD-10-CM | POA: Diagnosis not present

## 2017-02-17 DIAGNOSIS — H04123 Dry eye syndrome of bilateral lacrimal glands: Secondary | ICD-10-CM

## 2017-02-17 LAB — LIPID PANEL
CHOLESTEROL: 197 (ref 0–200)
HDL: 59 (ref 35–70)
LDL Cholesterol: 111
Triglycerides: 153 (ref 40–160)

## 2017-02-17 LAB — HEMOGLOBIN A1C: Hemoglobin A1C: 5.8

## 2017-02-17 NOTE — Assessment & Plan Note (Signed)
Around neck line redness and mild itching, apply 1% hydrocortisone cream bid affected the area x 2 weeks.

## 2017-02-17 NOTE — Assessment & Plan Note (Signed)
MiraLax 17gm mix with 4 oz water po daily.

## 2017-02-17 NOTE — Assessment & Plan Note (Signed)
Apply 1gtt artificial tears to OU qid x 4 weeks, observe.

## 2017-02-17 NOTE — Progress Notes (Signed)
Location:  [facility] Room Number: 194 Place of Service:  ALF [phone]) Provider:  Mast, Manxie  NP  Blanchie Serve, MD  Patient Care Team: Blanchie Serve, MD as PCP - General (Internal Medicine) Mast, Man X, NP as Nurse Practitioner (Internal Medicine)  Extended Emergency Contact Information Primary Emergency Contact: Culbreth,Robin Address: [address] [place], [place] [address] [place] Phone: [phone] Mobile Phone: [phone] Relation: Daughter  Code Status:  Full Code Goals of care: Advanced Directive information Advanced Directives 02/17/2017  Does Patient Have a Medical Advance Directive? No  Type of Advance Directive -  Does patient want to make changes to medical advance directive? -  Copy of [place] in Chart? -  Would patient like information on creating a medical advance directive? No - Patient declined  Pre-existing out of facility DNR order (yellow form or pink MOST form) -     Chief Complaint  Patient presents with  . Acute Visit    redness to eyes and neck    HPI:  Pt is a 84 y.o. female seen today for an acute visit for the stated she had itching redness and crusty eyelashes in morning a couple of days ago, now its much better, only slight redness and irritation in eyes noted. She denied change of vision, no intervention utilized.  She also complains of itching and noted mild redness about her collar line, she cannot recall its onset or duration, denied change of laundry or cosmatic products. She has reported her uncontrolled, constipation, burping and heartburns after eats for a while. She takes Famotidine 20mg  daily, ASA 81mg  qd   Past Medical History:  Diagnosis Date  . Acute blood loss anemia 08/23/2013   04/13/16 TSH 1.20, Na 141, K 4.2, Bun 15, creat 0.79, wbc 5.5, Hgb 11.5, plt 283  . Anxiety   . Anxiety state 07/02/2007   Qualifier: Diagnosis of  By: Lenna Gilford MD, Deborra Medina     . 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 Cigarette smoker   . Colon polyps 2009   TWO TUBULAR ADENOMAS AND HYPERPLASTIC POLYPS  . Constipation 09/14/2013  . COPD (chronic obstructive pulmonary disease) ([place])   . Diverticulosis of colon   . DJD (degenerative joint disease)   . Dog bite of limb 07/14/2010   Dog bite 07/10/10 - dogs shots utd Last td was 08/2009  Localized tx only.    . Fibromyalgia   . GERD 12/19/2006   Qualifier: Diagnosis of  By: Julien Girt CMA, Marliss Czar  04/13/16 TSH 1.20, Na 141, K 4.2, Bun 15, creat 0.79, wbc 5.5, Hgb 11.5, plt 283   . GERD (gastroesophageal reflux disease)   . Hammer toe of second toe of left foot 04/08/2016   Left 2nd  . Hearing loss   . Hemorrhoids   . Hypercholesterolemia   . Osteoarthritis 12/19/2006   Qualifier: Diagnosis of  By: Julien Girt CMA, Marliss Czar    . Osteoporosis    Past Surgical History:  Procedure Laterality Date  . CATARACT EXTRACTION, BILATERAL  2009  . COLONOSCOPY  2009, 2006 , 2017  . stapes surgery     Dr Thornell Mule  . TONSILLECTOMY AND ADENOIDECTOMY     as a child    Allergies  Allergen Reactions  . Penicillins Anaphylaxis  . Bisphosphonates Other (See Comments)    [name]  . Influenza Vaccines     Pt reported  .  Simvastatin     Unknown   . Trazodone And Nefazodone     Felt her throat was closing up/kept her awake  . Sulfonamide Derivatives Rash and Other (See Comments)    blisters    Outpatient Encounter Medications as of 02/17/2017  Medication Sig  . acetaminophen (TYLENOL) 325 MG tablet Take 650 mg daily by mouth.  Marland Kitchen aspirin 81 MG tablet Take 81 mg by mouth daily.  . calcium carbonate (TUMS EX) 750 MG chewable tablet Chew 750 mg by mouth daily.   . Calcium Carbonate-Vit D-Min (CALCIUM 600+D PLUS MINERALS) 600-400 MG-UNIT TABS Take 1 tablet by mouth daily.   . cholecalciferol (VITAMIN D) 1000 UNITS tablet Take 1,000 Units by mouth daily.  . cyclobenzaprine (FLEXERIL) 5 MG tablet Take 5 mg by mouth at  bedtime.  . famotidine (PEPCID) 20 MG tablet TAKE ONE TABLET BY MOUTH ONCE DAILY  . hydrocortisone cream 1 % Apply 1 application topically 2 (two) times daily. Apply to wrists as needed.  . polyethylene glycol (MIRALAX / GLYCOLAX) packet Take 17 g by mouth every other day.   . pregabalin (LYRICA) 25 MG capsule Take 25 mg by mouth 2 (two) times daily. Take for 1 week, then take 25 mg twice daily for fibromyalgia.   Marland Kitchen risperiDONE (RISPERDAL) 0.25 MG tablet Take 0.25 mg by mouth at bedtime.   No facility-administered encounter medications on file as of 02/17/2017.     Review of Systems  Constitutional: Negative for activity change, appetite change, chills, diaphoresis, fatigue and fever.  HENT: Positive for hearing loss. Negative for congestion, trouble swallowing and voice change.   Eyes: Positive for redness and itching. Negative for photophobia, pain, discharge and visual disturbance.  Respiratory: Negative for cough, choking, chest tightness, shortness of breath and wheezing.   Cardiovascular: Positive for leg swelling. Negative for chest pain.  Gastrointestinal: Positive for constipation. Negative for abdominal distention, abdominal pain, diarrhea, nausea and vomiting.       Burping, heart burns after eats  Genitourinary: Negative for difficulty urinating, dysuria and urgency.  Musculoskeletal: Positive for gait problem and myalgias. Negative for joint swelling.  Skin: Positive for color change and rash. Negative for pallor and wound.       neck  Neurological: Negative for tremors, speech difficulty, weakness and headaches.       Dementia  Psychiatric/Behavioral: Positive for confusion, hallucinations and sleep disturbance. Negative for agitation and behavioral problems. The patient is not nervous/anxious.     Immunization History  Administered Date(s) Administered  . Pneumococcal Conjugate-13 09/01/2015  . Pneumococcal Polysaccharide-23 07/07/1998  . Td 08/21/2009   Pertinent   Health Maintenance Due  Topic Date Due  . PNA vac Low Risk Adult (2 of 2 - PPSV23) 08/31/2016  . DEXA SCAN  Completed  . INFLUENZA VACCINE  Discontinued   Fall Risk  11/05/2016 09/01/2015 08/31/2012  Falls in the past year? Yes Yes No  Number falls in past yr: 2 or more 1 -  Injury with Fall? Yes No -  Risk for fall due to : - Impaired balance/gait -   Functional Status Survey:    Vitals:   02/17/17 1004  BP: 130/80  Pulse: 70  Resp: 18  Temp: (!) 97.1 F (36.2 C)  Weight: 148 lb 9.6 oz (67.4 kg)  Height: 5\' 2"  (1.575 m)   Body mass index is 27.18 kg/m. Physical Exam  Constitutional: She appears well-developed and well-nourished. No distress.  HENT:  Head: Normocephalic and atraumatic.  Eyes: EOM  are normal. Pupils are equal, round, and reactive to light. Right eye exhibits no discharge. Left eye exhibits no discharge. No scleral icterus.  Mild redness and irritation R+L conjunctivae   Neck: Normal range of motion. Neck supple. No JVD present. No thyromegaly present.  Cardiovascular: Normal rate, regular rhythm and normal heart sounds.  No murmur heard. Pulmonary/Chest: Effort normal and breath sounds normal. She has no wheezes. She has no rales.  Abdominal: Soft. Bowel sounds are normal. She exhibits no distension. There is no rebound and no guarding.  Musculoskeletal: Normal range of motion. She exhibits edema.  Ambulates with walker, trace edema BLE  Neurological: She is alert. She exhibits normal muscle tone. Coordination normal.  Oriented to person and place  Skin: Skin is warm and dry. Rash noted. She is not diaphoretic. There is erythema.  Redness and itching at the collar line on neck  Psychiatric: She has a normal mood and affect. Her behavior is normal.    Labs reviewed: Recent Labs    04/13/16 09/15/16 12/30/16  NA 141 142 140  K 4.2 4.5 4.1  BUN 15 14 21   CREATININE 0.8 0.8 0.8   Recent Labs    04/13/16 09/15/16 12/30/16  AST 16 18 15   ALT 12 12 9    ALKPHOS 91 111 71   Recent Labs    04/13/16 09/15/16 12/30/16  WBC 5.5 9.4 4.7  HGB 11.5* 13.3 11.7*  HCT 35* 39 34*  PLT 283 243 247   Lab Results  Component Value Date   TSH 2.32 01/13/2017   Lab Results  Component Value Date   HGBA1C 5.8 02/17/2017   Lab Results  Component Value Date   CHOL 197 02/17/2017   HDL 59 02/17/2017   LDLCALC 111 02/17/2017   LDLDIRECT 114.7 08/18/2011   TRIG 153 02/17/2017   CHOLHDL 2 01/23/2015    Significant Diagnostic Results in last 30 days:  No results found.  Assessment/Plan Bilateral dry eyes Apply 1gtt artificial tears to OU qid x 4 weeks, observe.   Constipation MiraLax 17gm mix with 4 oz water po daily.   GERD Not well controlled, administer ASA after eats, discontinue Famotidine, try Omeprazole 20mg  daily, observe.   Dermatitis Around neck line redness and mild itching, apply 1% hydrocortisone cream bid affected the area x 2 weeks.      Family/ staff Communication: plan of care reviewed with the patient and charge nurse  Labs/tests ordered:  none  Time spend 25 minutes

## 2017-02-17 NOTE — Assessment & Plan Note (Addendum)
Not well controlled, administer ASA after eats, discontinue Famotidine, try Omeprazole 20mg  daily, observe.

## 2017-02-18 ENCOUNTER — Other Ambulatory Visit: Payer: Self-pay | Admitting: *Deleted

## 2017-03-15 ENCOUNTER — Other Ambulatory Visit: Payer: Self-pay

## 2017-03-15 MED ORDER — PREGABALIN 25 MG PO CAPS: 25.0000 mg | ORAL_CAPSULE | Freq: Two times a day (BID) | ORAL | 0 refills | Status: DC

## 2017-04-21 ENCOUNTER — Non-Acute Institutional Stay: Payer: Medicare Other | Admitting: Internal Medicine

## 2017-04-21 ENCOUNTER — Encounter: Payer: Self-pay | Admitting: Internal Medicine

## 2017-04-21 DIAGNOSIS — J449 Chronic obstructive pulmonary disease, unspecified: Secondary | ICD-10-CM

## 2017-04-21 DIAGNOSIS — K219 Gastro-esophageal reflux disease without esophagitis: Secondary | ICD-10-CM | POA: Diagnosis not present

## 2017-04-21 DIAGNOSIS — K5901 Slow transit constipation: Secondary | ICD-10-CM

## 2017-04-21 DIAGNOSIS — M797 Fibromyalgia: Secondary | ICD-10-CM | POA: Diagnosis not present

## 2017-04-21 NOTE — Progress Notes (Signed)
Location:  [facility] Room Number: 144 Place of Service:  ALF [phone]) Provider:  Blanchie Serve MD  Blanchie Serve, MD  Patient Care Team: Blanchie Serve, MD as PCP - General (Internal Medicine) Mast, Man X, NP as Nurse Practitioner (Internal Medicine)  Extended Emergency Contact Information Primary Emergency Contact: Culbreth,Robin Address: [address] [place], [place] [address] [place] Phone: [phone] Mobile Phone: [phone] Relation: Daughter  Code Status:  Full code  Goals of care: Advanced Directive information Advanced Directives 04/21/2017  Does Patient Have a Medical Advance Directive? Yes  Type of Advance Directive Living will  Does patient want to make changes to medical advance directive? No - Patient declined  Copy of [place] in Chart? -  Would patient like information on creating a medical advance directive? -  Pre-existing out of facility DNR order (yellow form or pink MOST form) -     Chief Complaint  Patient presents with  . Medical Management of Chronic Issues    Routine Visit     HPI:  Pt is a 84 y.o. female seen today for medical management of chronic diseases. She complaints of heartburn and indigestion. She has arthritis pain and neck muscle tightness but mentions it to be tolerable. She had loose stools today. Denies any other concern. No acute concern from nursing. BP readings stable on review. Has known history of fibromyalgia and lyrica is helping. Currently on daily miralax.     Past Medical History:  Diagnosis Date  . Acute blood loss anemia 08/23/2013   04/13/16 TSH 1.20, Na 141, K 4.2, Bun 15, creat 0.79, wbc 5.5, Hgb 11.5, plt 283  . Anxiety   . Anxiety state 07/02/2007   Qualifier: Diagnosis of  By: Lenna Gilford MD, Deborra Medina   . 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 Cigarette smoker   . Colon polyps 2009   TWO TUBULAR ADENOMAS AND HYPERPLASTIC POLYPS    . Constipation 09/14/2013  . COPD (chronic obstructive pulmonary disease) ([place])   . Diverticulosis of colon   . DJD (degenerative joint disease)   . Dog bite of limb 07/14/2010   Dog bite 07/10/10 - dogs shots utd Last td was 08/2009  Localized tx only.    . Fibromyalgia   . GERD 12/19/2006   Qualifier: Diagnosis of  By: Julien Girt CMA, Marliss Czar  04/13/16 TSH 1.20, Na 141, K 4.2, Bun 15, creat 0.79, wbc 5.5, Hgb 11.5, plt 283   . GERD (gastroesophageal reflux disease)   . Hammer toe of second toe of left foot 04/08/2016   Left 2nd  . Hearing loss   . Hemorrhoids   . Hypercholesterolemia   . Osteoarthritis 12/19/2006   Qualifier: Diagnosis of  By: Julien Girt CMA, Marliss Czar    . Osteoporosis    Past Surgical History:  Procedure Laterality Date  . CATARACT EXTRACTION, BILATERAL  2009  . COLONOSCOPY  2009, 2006 , 2017  . stapes surgery     Dr Thornell Mule  . TONSILLECTOMY AND ADENOIDECTOMY     as a child    Allergies  Allergen Reactions  . Penicillins Anaphylaxis  . Bisphosphonates Other (See Comments)    [name]  . Influenza Vaccines     Pt reported  . Simvastatin     Unknown   . Trazodone And Nefazodone     Felt her throat was closing up/kept her awake  . Sulfonamide Derivatives Rash  and Other (See Comments)    blisters    Outpatient Encounter Medications as of 04/21/2017  Medication Sig  . acetaminophen (TYLENOL) 325 MG tablet Take 650 mg by mouth daily as needed.   Marland Kitchen aspirin 81 MG tablet Take 81 mg by mouth daily.  . calcium carbonate (TUMS EX) 750 MG chewable tablet Chew 750 mg by mouth daily.   . Calcium Carbonate-Vit D-Min (CALCIUM 600+D PLUS MINERALS) 600-400 MG-UNIT TABS Take 1 tablet by mouth daily.   . cholecalciferol (VITAMIN D) 1000 UNITS tablet Take 1,000 Units by mouth daily.  . cyclobenzaprine (FLEXERIL) 5 MG tablet Take 5 mg by mouth at bedtime.  . hydrocortisone cream 1 % Apply 1 application topically 2 (two) times daily as needed. Apply to wrists  . omeprazole (PRILOSEC)  20 MG capsule Take 20 mg by mouth daily.  . polyethylene glycol (MIRALAX / GLYCOLAX) packet Take 17 g by mouth daily.   . pregabalin (LYRICA) 25 MG capsule Take 1 capsule (25 mg total) by mouth 2 (two) times daily. Take for 1 week, then take 25 mg twice daily for fibromyalgia.  Marland Kitchen risperiDONE (RISPERDAL) 0.25 MG tablet Take 0.25 mg by mouth at bedtime.  . [DISCONTINUED] famotidine (PEPCID) 20 MG tablet TAKE ONE TABLET BY MOUTH ONCE DAILY   No facility-administered encounter medications on file as of 04/21/2017.     Review of Systems  Constitutional: Negative for appetite change, chills and fever.  HENT: Positive for hearing loss. Negative for congestion, sinus pressure and trouble swallowing.   Eyes: Positive for visual disturbance.  Respiratory: Negative for cough.   Cardiovascular: Negative for chest pain, palpitations and leg swelling.  Gastrointestinal: Positive for constipation. Negative for abdominal pain, blood in stool, nausea and vomiting.  Genitourinary: Negative for dysuria and frequency.  Musculoskeletal: Positive for arthralgias, back pain and gait problem.  Skin: Negative for rash.  Neurological: Negative for dizziness, syncope and headaches.  Psychiatric/Behavioral: Positive for behavioral problems.    Immunization History  Administered Date(s) Administered  . Pneumococcal Conjugate-13 09/01/2015  . Pneumococcal Polysaccharide-23 07/07/1998  . Td 08/21/2009   Pertinent  Health Maintenance Due  Topic Date Due  . PNA vac Low Risk Adult (2 of 2 - PPSV23) 08/31/2016  . DEXA SCAN  Completed  . INFLUENZA VACCINE  Discontinued   Fall Risk  11/05/2016 09/01/2015 08/31/2012  Falls in the past year? Yes Yes No  Number falls in past yr: 2 or more 1 -  Injury with Fall? Yes No -  Risk for fall due to : - Impaired balance/gait -   Functional Status Survey:    Vitals:   04/21/17 1455  BP: 134/70  Pulse: 76  Resp: 20  Temp: (!) 97.5 F (36.4 C)  TempSrc: Oral  SpO2: 96%    Weight: 144 lb (65.3 kg)  Height: 5\' 2"  (1.575 m)   Body mass index is 26.34 kg/m.   Wt Readings from Last 3 Encounters:  04/21/17 144 lb (65.3 kg)  02/17/17 148 lb 9.6 oz (67.4 kg)  01/21/17 145 lb 6.4 oz (66 kg)   Physical Exam  Constitutional: She is oriented to person, place, and time. She appears well-developed and well-nourished. No distress.  HENT:  Head: Normocephalic and atraumatic.  Mouth/Throat: Oropharynx is clear and moist. No oropharyngeal exudate.  Dentures present, hearing aid  Eyes: Conjunctivae and EOM are normal. Pupils are equal, round, and reactive to light. Right eye exhibits no discharge. Left eye exhibits no discharge.  Neck: Normal range of  motion. Neck supple.  Cardiovascular: Normal rate and regular rhythm.  Pulmonary/Chest: Effort normal and breath sounds normal. She has no wheezes. She has no rales.  Abdominal: Soft. Bowel sounds are normal. There is no tenderness. There is no guarding.  Musculoskeletal: She exhibits edema.  Lymphadenopathy:    She has no cervical adenopathy.  Neurological: She is alert and oriented to person, place, and time.  Skin: Skin is warm and dry. No rash noted. She is not diaphoretic.  Psychiatric: She has a normal mood and affect. Her behavior is normal.    Labs reviewed: Recent Labs    09/15/16 12/30/16  NA 142 140  K 4.5 4.1  BUN 14 21  CREATININE 0.8 0.8   Recent Labs    09/15/16 12/30/16  AST 18 15  ALT 12 9  ALKPHOS 111 71   Recent Labs    09/15/16 12/30/16  WBC 9.4 4.7  HGB 13.3 11.7*  HCT 39 34*  PLT 243 247   Lab Results  Component Value Date   TSH 2.32 01/13/2017   Lab Results  Component Value Date   HGBA1C 5.8 02/17/2017   Lab Results  Component Value Date   CHOL 197 02/17/2017   HDL 59 02/17/2017   LDLCALC 111 02/17/2017   LDLDIRECT 114.7 08/18/2011   TRIG 153 02/17/2017   CHOLHDL 2 01/23/2015    Significant Diagnostic Results in last 30 days:  No results  found.  Assessment/Plan  Fibromyalgia Continue cyclobenzaperine daily and pregabalin 25 mg bid  gerd Increase omeprazole to 20 mg bid and monitor  Chronic constipation Encouraged hydration. Change miralax to 17 g every other day and monitor  COPD Breathing stable, not on any bronchodilators.      Family/ staff Communication: reviewed care plan with patient and charge nurse.    Labs/tests ordered:  none   Blanchie Serve, MD Internal Medicine [facility] [address] [place], [place] [address] Cell Phone (Monday-Friday 8 am - 5 pm): [phone] On Call: [phone] and follow prompts after 5 pm and on weekends Office Phone: [phone] Office Fax: [phone]

## 2017-04-26 DIAGNOSIS — L600 Ingrowing nail: Secondary | ICD-10-CM | POA: Diagnosis not present

## 2017-04-26 DIAGNOSIS — M79672 Pain in left foot: Secondary | ICD-10-CM | POA: Diagnosis not present

## 2017-07-21 ENCOUNTER — Non-Acute Institutional Stay: Payer: Medicare Other | Admitting: Nurse Practitioner

## 2017-07-21 ENCOUNTER — Encounter: Payer: Self-pay | Admitting: Nurse Practitioner

## 2017-07-21 DIAGNOSIS — F419 Anxiety disorder, unspecified: Secondary | ICD-10-CM | POA: Insufficient documentation

## 2017-07-21 DIAGNOSIS — R442 Other hallucinations: Secondary | ICD-10-CM | POA: Diagnosis not present

## 2017-07-21 DIAGNOSIS — M797 Fibromyalgia: Secondary | ICD-10-CM | POA: Diagnosis not present

## 2017-07-21 DIAGNOSIS — K5901 Slow transit constipation: Secondary | ICD-10-CM

## 2017-07-21 DIAGNOSIS — F5105 Insomnia due to other mental disorder: Secondary | ICD-10-CM | POA: Diagnosis not present

## 2017-07-21 NOTE — Assessment & Plan Note (Signed)
She denied pain, hx of Fibromyalgia, continue  Lyrica 25mg  bid,  Naproxen to prn

## 2017-07-21 NOTE — Assessment & Plan Note (Signed)
Stable, continue MiraLax daily.  

## 2017-07-21 NOTE — Assessment & Plan Note (Addendum)
she complains of not able to fall and maintain asleep at night for a while. Will try Mirtazapine 7.5mg  po qhs, update CBC CMP, observe.  08/09/17 the patient has been refusing Mirtazapine about 2 weeks, she stated she doesn't need it, will stop it today, observe.

## 2017-07-21 NOTE — Progress Notes (Addendum)
Location:  [facility] Room Number: 606 Place of Service:  ALF [phone]) Provider: Carolanne Mercier, Lennie Odor  NP  Blanchie Serve, MD  Patient Care Team: Blanchie Serve, MD as PCP - General (Internal Medicine) Velta Rockholt X, NP as Nurse Practitioner (Internal Medicine)  Extended Emergency Contact Information Primary Emergency Contact: Culbreth,Robin Address: [address] [place], [place] [address] [place] Phone: [phone] Mobile Phone: [phone] Relation: Daughter  Code Status:  Full Code Goals of care: Advanced Directive information Advanced Directives 07/21/2017  Does Patient Have a Medical Advance Directive? Yes  Type of Advance Directive Living will  Does patient want to make changes to medical advance directive? No - Patient declined  Copy of [place] in Chart? -  Would patient like information on creating a medical advance directive? No - Patient declined  Pre-existing out of facility DNR order (yellow form or pink MOST form) -     Chief Complaint  Patient presents with  . Medical Management of Chronic Issues    F/U- GERD, Fibromyalgia,     HPI:  Pt is a 84 y.o. female seen today for medical management of chronic diseases.     The patient has history of anxiety, she complains of not able to fall and maintain asleep at night for a while. She denied pain except chronic knee aches/pain,  hx of Fibromyalgia, on Lyrica 25mg  bid, able to change Naproxen to prn, her hallucination of body sensation is managed with Risperdal 0.25mg  qhs. No constipation on MiraLax daily.     Past Medical History:  Diagnosis Date  . Acute blood loss anemia 08/23/2013   04/13/16 TSH 1.20, Na 141, K 4.2, Bun 15, creat 0.79, wbc 5.5, Hgb 11.5, plt 283  . Anxiety   . Anxiety state 07/02/2007   Qualifier: Diagnosis of  By: Lenna Gilford MD, Deborra Medina   . 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 Cigarette smoker   . Colon polyps 2009     TWO TUBULAR ADENOMAS AND HYPERPLASTIC POLYPS  . Constipation 09/14/2013  . COPD (chronic obstructive pulmonary disease) ([place])   . Diverticulosis of colon   . DJD (degenerative joint disease)   . Dog bite of limb 07/14/2010   Dog bite 07/10/10 - dogs shots utd Last td was 08/2009  Localized tx only.    . Fibromyalgia   . GERD 12/19/2006   Qualifier: Diagnosis of  By: Julien Girt CMA, Marliss Czar  04/13/16 TSH 1.20, Na 141, K 4.2, Bun 15, creat 0.79, wbc 5.5, Hgb 11.5, plt 283   . GERD (gastroesophageal reflux disease)   . Hammer toe of second toe of left foot 04/08/2016   Left 2nd  . Hearing loss   . Hemorrhoids   . Hypercholesterolemia   . Osteoarthritis 12/19/2006   Qualifier: Diagnosis of  By: Julien Girt CMA, Marliss Czar    . Osteoporosis    Past Surgical History:  Procedure Laterality Date  . CATARACT EXTRACTION, BILATERAL  2009  . COLONOSCOPY  2009, 2006 , 2017  . stapes surgery     Dr Thornell Mule  . TONSILLECTOMY AND ADENOIDECTOMY     as a child    Allergies  Allergen Reactions  . Penicillins Anaphylaxis  . Bisphosphonates Other (See Comments)    [name]  . Influenza Vaccines     Pt reported  . Simvastatin     Unknown   . Trazodone And Nefazodone  Felt her throat was closing up/kept her awake  . Sulfonamide Derivatives Rash and Other (See Comments)    blisters    Outpatient Encounter Medications as of 07/21/2017  Medication Sig  . aspirin 81 MG tablet Take 81 mg by mouth daily.  . calcium carbonate (TUMS EX) 750 MG chewable tablet Chew 750 mg by mouth daily.   . Calcium Carbonate-Vit D-Min (CALCIUM 600+D PLUS MINERALS) 600-400 MG-UNIT TABS Take 1 tablet by mouth daily.   . cholecalciferol (VITAMIN D) 1000 UNITS tablet Take 1,000 Units by mouth daily.  . cyclobenzaprine (FLEXERIL) 5 MG tablet Take 5 mg by mouth at bedtime.  . hydrocortisone cream 1 % Apply 1 application topically 2 (two) times daily as needed. Apply to wrists  . naproxen sodium (ALEVE) 220 MG tablet Take 220 mg by  mouth at bedtime.  Marland Kitchen omeprazole (PRILOSEC) 20 MG capsule Take 20 mg by mouth daily.  . polyethylene glycol (MIRALAX / GLYCOLAX) packet Take 17 g by mouth daily.   . pregabalin (LYRICA) 25 MG capsule Take 1 capsule (25 mg total) by mouth 2 (two) times daily. Take for 1 week, then take 25 mg twice daily for fibromyalgia.  Marland Kitchen risperiDONE (RISPERDAL) 0.25 MG tablet Take 0.25 mg by mouth at bedtime.  . [DISCONTINUED] acetaminophen (TYLENOL) 325 MG tablet Take 650 mg by mouth daily as needed.    No facility-administered encounter medications on file as of 07/21/2017.     Review of Systems  Constitutional: Negative for activity change, appetite change, fatigue and fever.  HENT: Positive for hearing loss. Negative for congestion, trouble swallowing and voice change.   Eyes: Positive for visual disturbance. Negative for pain, redness and itching.       "seeing bugs"  Respiratory: Negative for cough, shortness of breath and wheezing.   Cardiovascular: Positive for leg swelling. Negative for chest pain and palpitations.  Gastrointestinal: Negative for abdominal distention, abdominal pain, constipation, diarrhea, nausea and vomiting.       Burping sometimes.   Genitourinary: Negative for difficulty urinating and urgency.  Musculoskeletal: Positive for arthralgias and gait problem. Negative for joint swelling.       Chronic knee pain  Skin: Negative for color change and pallor.  Neurological: Negative for dizziness, speech difficulty, weakness and headaches.  Psychiatric/Behavioral: Positive for sleep disturbance. Negative for agitation, behavioral problems, confusion and hallucinations. The patient is not nervous/anxious.     Immunization History  Administered Date(s) Administered  . Pneumococcal Conjugate-13 09/01/2015  . Pneumococcal Polysaccharide-23 07/07/1998  . Td 08/21/2009   Pertinent  Health Maintenance Due  Topic Date Due  . PNA vac Low Risk Adult (2 of 2 - PPSV23) 08/31/2016  . DEXA  SCAN  Completed  . INFLUENZA VACCINE  Discontinued   Fall Risk  11/05/2016 09/01/2015 08/31/2012  Falls in the past year? Yes Yes No  Number falls in past yr: 2 or more 1 -  Injury with Fall? Yes No -  Risk for fall due to : - Impaired balance/gait -   Functional Status Survey:    Vitals:   07/21/17 1045  BP: 130/60  Pulse: 64  Resp: 18  Temp: 98 F (36.7 C)  Weight: 144 lb 3.2 oz (65.4 kg)  Height: 5\' 2"  (1.575 m)   Body mass index is 26.37 kg/m. Physical Exam  Constitutional: She appears well-developed and well-nourished.  HENT:  Head: Normocephalic and atraumatic.  Eyes: Pupils are equal, round, and reactive to light. EOM are normal.  Neck: Normal range of  motion. Neck supple. No JVD present. No thyromegaly present.  Cardiovascular: Normal rate and regular rhythm.  No murmur heard. Pulmonary/Chest: Effort normal and breath sounds normal. She has no wheezes.  Abdominal: Soft. Bowel sounds are normal. She exhibits no distension. There is no tenderness. There is no rebound.  Musculoskeletal: She exhibits edema and tenderness.  Chronic knee pain, trace edema BLE, ambulates with walker.   Neurological: She is alert. No cranial nerve deficit. She exhibits normal muscle tone. Coordination normal.  Oriented to person and place.   Skin: Skin is warm.  Psychiatric: She has a normal mood and affect. Her behavior is normal.    Labs reviewed: Recent Labs    09/15/16 12/30/16 07/26/17  NA 142 140 142  K 4.5 4.1 4.2  CL  --   --  109  CO2  --   --  27  BUN 14 21 16   CREATININE 0.8 0.8 0.8  CALCIUM  --   --  9.0   Recent Labs    09/15/16 12/30/16 07/26/17  AST 18 15 16   ALT 12 9 10   ALKPHOS 111 71 101  ALBUMIN  --   --  3.8   Recent Labs    09/15/16 12/30/16 07/26/17  WBC 9.4 4.7 5.3  HGB 13.3 11.7* 11.9*  HCT 39 34* 35*  PLT 243 247 222   Lab Results  Component Value Date   TSH 2.32 01/13/2017   Lab Results  Component Value Date   HGBA1C 5.8 02/17/2017    Lab Results  Component Value Date   CHOL 197 02/17/2017   HDL 59 02/17/2017   LDLCALC 111 02/17/2017   LDLDIRECT 114.7 08/18/2011   TRIG 153 02/17/2017   CHOLHDL 2 01/23/2015    Significant Diagnostic Results in last 30 days:  No results found.  Assessment/Plan Insomnia secondary to anxiety she complains of not able to fall and maintain asleep at night for a while. Will try Mirtazapine 7.5mg  po qhs, update CBC CMP, observe.  08/09/17 the patient has been refusing Mirtazapine about 2 weeks, she stated she doesn't need it, will stop it today, observe.   Fibromyalgia She denied pain, hx of Fibromyalgia, continue  Lyrica 25mg  bid,  Naproxen to prn    Hallucination of body sensation her hallucination of body sensation is managed with Risperdal 0.25mg  qhs. She stated she sees "bugs" in her eye occasionally, no pain, irritation, or chang of vision, she has upcoming ophthalmology appointment soon.  08/09/17 the patient has been refusing Risperdal about 2 weeks, she stated she doesn't need it, will dc it today, observer for return of targeted symptoms.   Constipation Stable, continue MiraLax daily.      Family/ staff Communication: plan of care reviewed with the patient and charge nurse.   Labs/tests ordered:  CBC, CMP   Time spend 25 minutes.

## 2017-07-21 NOTE — Assessment & Plan Note (Addendum)
her hallucination of body sensation is managed with Risperdal 0.25mg  qhs. She stated she sees "bugs" in her eye occasionally, no pain, irritation, or chang of vision, she has upcoming ophthalmology appointment soon.  08/09/17 the patient has been refusing Risperdal about 2 weeks, she stated she doesn't need it, will dc it today, observer for return of targeted symptoms.

## 2017-07-22 ENCOUNTER — Encounter: Payer: Self-pay | Admitting: Nurse Practitioner

## 2017-07-26 DIAGNOSIS — D649 Anemia, unspecified: Secondary | ICD-10-CM | POA: Diagnosis not present

## 2017-07-26 DIAGNOSIS — F419 Anxiety disorder, unspecified: Secondary | ICD-10-CM | POA: Diagnosis not present

## 2017-07-26 DIAGNOSIS — R5383 Other fatigue: Secondary | ICD-10-CM | POA: Diagnosis not present

## 2017-07-26 DIAGNOSIS — Z79899 Other long term (current) drug therapy: Secondary | ICD-10-CM | POA: Diagnosis not present

## 2017-07-26 DIAGNOSIS — J449 Chronic obstructive pulmonary disease, unspecified: Secondary | ICD-10-CM | POA: Diagnosis not present

## 2017-07-26 LAB — CBC AND DIFFERENTIAL
HEMATOCRIT: 35 — AB (ref 36–46)
Hemoglobin: 11.9 — AB (ref 12.0–16.0)
PLATELETS: 222 (ref 150–399)
WBC: 5.3

## 2017-07-26 LAB — HEPATIC FUNCTION PANEL
ALT: 10 (ref 7–35)
AST: 16 (ref 13–35)
Alkaline Phosphatase: 101 (ref 25–125)
BILIRUBIN, TOTAL: 0.4

## 2017-07-26 LAB — BASIC METABOLIC PANEL
BUN: 16 (ref 4–21)
CREATININE: 0.8 (ref <=1.1)
GLUCOSE: 89
Potassium: 4.2 (ref 3.4–5.3)
Sodium: 142 (ref 137–147)

## 2017-07-27 ENCOUNTER — Other Ambulatory Visit: Payer: Self-pay | Admitting: *Deleted

## 2017-07-27 LAB — COMPLETE METABOLIC PANEL WITH GFR
Albumin: 3.8
CALCIUM: 9
CHLORIDE: 109
CO2: 27
Globulin: 1.8
PROTEIN: 5.6

## 2017-09-08 ENCOUNTER — Other Ambulatory Visit: Payer: Self-pay

## 2017-09-08 ENCOUNTER — Encounter: Payer: Self-pay | Admitting: Internal Medicine

## 2017-09-08 ENCOUNTER — Non-Acute Institutional Stay: Payer: Medicare Other | Admitting: Internal Medicine

## 2017-09-08 DIAGNOSIS — G4700 Insomnia, unspecified: Secondary | ICD-10-CM | POA: Diagnosis not present

## 2017-09-08 DIAGNOSIS — D649 Anemia, unspecified: Secondary | ICD-10-CM

## 2017-09-08 DIAGNOSIS — R442 Other hallucinations: Secondary | ICD-10-CM

## 2017-09-08 MED ORDER — PREGABALIN 25 MG PO CAPS: 25.0000 mg | ORAL_CAPSULE | Freq: Two times a day (BID) | ORAL | 3 refills | Status: DC

## 2017-09-08 NOTE — Progress Notes (Signed)
Location:  [facility] Room Number: 673 Place of Service:  ALF [phone]) Provider:  Blanchie Serve, MD  Blanchie Serve, MD  Patient Care Team: Blanchie Serve, MD as PCP - General (Internal Medicine) Mast, Man X, NP as Nurse Practitioner (Internal Medicine)  Extended Emergency Contact Information Primary Emergency Contact: Culbreth,Robin Address: [address] [place], [place] [address] [place] Phone: [phone] Mobile Phone: [phone] Relation: Daughter   Goals of care: Advanced Directive information Advanced Directives 09/08/2017  Does Patient Have a Medical Advance Directive? Yes  Type of Advance Directive Living will  Does patient want to make changes to medical advance directive? No - Patient declined  Copy of [place] in Chart? -  Would patient like information on creating a medical advance directive? -  Pre-existing out of facility DNR order (yellow form or pink MOST form) -     Chief Complaint  Patient presents with  . Acute Visit    Insomnia and bugs under skin     HPI:  Pt is a 84 y.o. female seen today for an acute visit for problem falling asleep at night and having crawling sensation of bugs on her skin. She has had both these complaints on and off for few months. Per last note, she was tried on remeron and risperdal in past and refused to take it after few days of being on it. She is seen in her room today. Complaints of trouble falling and staying asleep. Also has feeling of bugs crawling under her skin, she firmly believes that bug is there in her bed and sheets and clothes. This concern of crawling sensation bug appears to be going on for almost a year now.    Past Medical History:  Diagnosis Date  . Acute blood loss anemia 08/23/2013   04/13/16 TSH 1.20, Na 141, K 4.2, Bun 15, creat 0.79, wbc 5.5, Hgb 11.5, plt 283  . Anxiety   . Anxiety state 07/02/2007   Qualifier: Diagnosis of   By: Lenna Gilford MD, Deborra Medina   . 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 Cigarette smoker   . Colon polyps 2009   TWO TUBULAR ADENOMAS AND HYPERPLASTIC POLYPS  . Constipation 09/14/2013  . COPD (chronic obstructive pulmonary disease) ([place])   . Diverticulosis of colon   . DJD (degenerative joint disease)   . Dog bite of limb 07/14/2010   Dog bite 07/10/10 - dogs shots utd Last td was 08/2009  Localized tx only.    . Fibromyalgia   . GERD 12/19/2006   Qualifier: Diagnosis of  By: Julien Girt CMA, Marliss Czar  04/13/16 TSH 1.20, Na 141, K 4.2, Bun 15, creat 0.79, wbc 5.5, Hgb 11.5, plt 283   . GERD (gastroesophageal reflux disease)   . Hammer toe of second toe of left foot 04/08/2016   Left 2nd  . Hearing loss   . Hemorrhoids   . Hypercholesterolemia   . Osteoarthritis 12/19/2006   Qualifier: Diagnosis of  By: Julien Girt CMA, Marliss Czar    . Osteoporosis    Past Surgical History:  Procedure Laterality Date  . CATARACT EXTRACTION, BILATERAL  2009  . COLONOSCOPY  2009, 2006 , 2017  . stapes surgery     Dr Thornell Mule  . TONSILLECTOMY AND ADENOIDECTOMY     as a child    Allergies  Allergen Reactions  . Penicillins Anaphylaxis  . Bisphosphonates Other (See Comments)    pt  states INTOL  . Influenza Vaccines     Pt reported  . Simvastatin     Unknown   . Trazodone And Nefazodone     Felt her throat was closing up/kept her awake  . Sulfonamide Derivatives Rash and Other (See Comments)    blisters    Outpatient Encounter Medications as of 09/08/2017  Medication Sig  . aspirin 81 MG tablet Take 81 mg by mouth daily.  . calcium carbonate (TUMS EX) 750 MG chewable tablet Chew 750 mg by mouth daily.   . Calcium Carbonate-Vit D-Min (CALCIUM 600+D PLUS MINERALS) 600-400 MG-UNIT TABS Take 1 tablet by mouth daily.   . cholecalciferol (VITAMIN D) 1000 UNITS tablet Take 1,000 Units by mouth daily.  . cyclobenzaprine (FLEXERIL) 5 MG tablet Take 5 mg by mouth at bedtime.  . hydrocortisone cream 1 % Apply 1  application topically 2 (two) times daily as needed. Apply to wrists  . naproxen sodium (ALEVE) 220 MG tablet Take 220 mg by mouth at bedtime.  Marland Kitchen omeprazole (PRILOSEC) 20 MG capsule Take 20 mg by mouth 2 (two) times daily.   . polyethylene glycol (MIRALAX / GLYCOLAX) packet Take 17 g by mouth every other day.   . pregabalin (LYRICA) 25 MG capsule Take 1 capsule (25 mg total) by mouth 2 (two) times daily. Take for 1 week, then take 25 mg twice daily for fibromyalgia.  . [DISCONTINUED] risperiDONE (RISPERDAL) 0.25 MG tablet Take 0.25 mg by mouth at bedtime.   No facility-administered encounter medications on file as of 09/08/2017.     Review of Systems  Constitutional: Negative for appetite change, chills and fever.  HENT: Negative for congestion and trouble swallowing.   Eyes: Positive for visual disturbance.  Respiratory: Negative for cough and shortness of breath.   Cardiovascular: Negative for chest pain and palpitations.  Gastrointestinal: Negative for abdominal pain, nausea and vomiting.  Genitourinary: Negative for dysuria.  Musculoskeletal: Positive for gait problem.  Neurological: Negative for dizziness, seizures, weakness and headaches.  Psychiatric/Behavioral: Positive for sleep disturbance. Negative for behavioral problems and self-injury.    Immunization History  Administered Date(s) Administered  . Pneumococcal Conjugate-13 09/01/2015  . Pneumococcal Polysaccharide-23 07/07/1998  . Td 08/21/2009   Pertinent  Health Maintenance Due  Topic Date Due  . PNA vac Low Risk Adult (2 of 2 - PPSV23) 08/31/2016  . DEXA SCAN  Completed  . INFLUENZA VACCINE  Discontinued   Fall Risk  11/05/2016 09/01/2015 08/31/2012  Falls in the past year? Yes Yes No  Number falls in past yr: 2 or more 1 -  Injury with Fall? Yes No -  Risk for fall due to : - Impaired balance/gait -   Functional Status Survey:    Vitals:   09/08/17 1503  BP: (!) 142/70  Pulse: 76  Resp: 18  Temp: 97.8 F  (36.6 C)  TempSrc: Oral  SpO2: 96%  Weight: 142 lb 12.8 oz (64.8 kg)  Height: 5\' 4"  (1.626 m)   Body mass index is 24.51 kg/m. Physical Exam  Constitutional: She is oriented to person, place, and time. No distress.  Elderly female  HENT:  Head: Normocephalic and atraumatic.  Mouth/Throat: Oropharynx is clear and moist.  Eyes: Pupils are equal, round, and reactive to light. Conjunctivae and EOM are normal.  Neck: Neck supple.  Cardiovascular: Normal rate and regular rhythm.  Pulmonary/Chest: Effort normal and breath sounds normal.  Abdominal: Soft. Bowel sounds are normal.  Musculoskeletal: Normal range of motion.  Uses walker for  ambulation, unsteady gait, can move all 4 ext, normal tone   Lymphadenopathy:    She has no cervical adenopathy.  Neurological: She is alert and oriented to person, place, and time.  Skin: Skin is warm and dry. No rash noted. She is not diaphoretic. No erythema.    Labs reviewed: Recent Labs    09/15/16 12/30/16 07/26/17  NA 142 140 142  K 4.5 4.1 4.2  CL  --   --  109  CO2  --   --  27  BUN 14 21 16   CREATININE 0.8 0.8 0.8  CALCIUM  --   --  9.0   Recent Labs    09/15/16 12/30/16 07/26/17  AST 18 15 16   ALT 12 9 10   ALKPHOS 111 71 101  ALBUMIN  --   --  3.8   Recent Labs    09/15/16 12/30/16 07/26/17  WBC 9.4 4.7 5.3  HGB 13.3 11.7* 11.9*  HCT 39 34* 35*  PLT 243 247 222   Lab Results  Component Value Date   TSH 2.32 01/13/2017   Lab Results  Component Value Date   HGBA1C 5.8 02/17/2017   Lab Results  Component Value Date   CHOL 197 02/17/2017   HDL 59 02/17/2017   LDLCALC 111 02/17/2017   LDLDIRECT 114.7 08/18/2011   TRIG 153 02/17/2017   CHOLHDL 2 01/23/2015    Significant Diagnostic Results in last 30 days:  No results found.  Assessment/Plan  1. Insomnia, unspecified type Start melatonin 5 mg qhs at 10 pm, if does not help, try trazodone  2. Tactile hallucination Rule out iron and b12 deficiency, check  TSH. If levels normal, consider requip, if no improvement concern for psychological delusional infestation  3. Anemia, unspecified type Workup for b12 and iron deficiency as above     Family/ staff Communication: reviewed care plan with patient and charge nurse.    Labs/tests ordered:  As above  Blanchie Serve, MD Internal Medicine [place], [place] [address] Cell Phone (Monday-Friday 8 am - 5 pm): [phone] On Call: [phone] and follow prompts after 5 pm and on weekends Office Phone: [phone] Office Fax: [phone]

## 2017-09-12 ENCOUNTER — Non-Acute Institutional Stay: Payer: Medicare Other | Admitting: Internal Medicine

## 2017-09-12 ENCOUNTER — Encounter: Payer: Self-pay | Admitting: Internal Medicine

## 2017-09-12 DIAGNOSIS — N3001 Acute cystitis with hematuria: Secondary | ICD-10-CM

## 2017-09-12 DIAGNOSIS — N390 Urinary tract infection, site not specified: Secondary | ICD-10-CM | POA: Diagnosis not present

## 2017-09-12 DIAGNOSIS — R300 Dysuria: Secondary | ICD-10-CM | POA: Diagnosis not present

## 2017-09-12 DIAGNOSIS — R509 Fever, unspecified: Secondary | ICD-10-CM | POA: Diagnosis not present

## 2017-09-12 NOTE — Progress Notes (Signed)
Location:  [facility] Room Number: 329 Place of Service:  ALF [phone]) Provider:  Blanchie Serve, MD  Blanchie Serve, MD  Patient Care Team: Blanchie Serve, MD as PCP - General (Internal Medicine) Mast, Man X, NP as Nurse Practitioner (Internal Medicine)  Extended Emergency Contact Information Primary Emergency Contact: Culbreth,Robin Address: [address] [place], [place] [address] [place] Phone: [phone] Mobile Phone: [phone] Relation: Daughter   Goals of care: Advanced Directive information Advanced Directives 09/12/2017  Does Patient Have a Medical Advance Directive? Yes  Type of Advance Directive Living will  Does patient want to make changes to medical advance directive? No - Patient declined  Copy of [place] in Chart? -  Would patient like information on creating a medical advance directive? -  Pre-existing out of facility DNR order (yellow form or pink MOST form) -     Chief Complaint  Patient presents with  . Acute Visit    Urinary complaints    HPI:  Pt is a 84 y.o. female seen today for an acute visit for urinary complaints. She complaints of increased urinary frequency with burning and pain x 2 days. She noticed frank blood in her urine yesterday. She complaints of pain to her back and lower abdomen. No known history of recent UTI. Per nursing, no fever reported. They have noticed pt to be increasingly confused.    Past Medical History:  Diagnosis Date  . Acute blood loss anemia 08/23/2013   04/13/16 TSH 1.20, Na 141, K 4.2, Bun 15, creat 0.79, wbc 5.5, Hgb 11.5, plt 283  . Anxiety   . Anxiety state 07/02/2007   Qualifier: Diagnosis of  By: Lenna Gilford MD, Deborra Medina   . 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 Cigarette smoker   . Colon polyps 2009   TWO TUBULAR ADENOMAS AND HYPERPLASTIC POLYPS  . Constipation 09/14/2013  . COPD (chronic obstructive pulmonary disease)  ([place])   . Diverticulosis of colon   . DJD (degenerative joint disease)   . Dog bite of limb 07/14/2010   Dog bite 07/10/10 - dogs shots utd Last td was 08/2009  Localized tx only.    . Fibromyalgia   . GERD 12/19/2006   Qualifier: Diagnosis of  By: Julien Girt CMA, Marliss Czar  04/13/16 TSH 1.20, Na 141, K 4.2, Bun 15, creat 0.79, wbc 5.5, Hgb 11.5, plt 283   . GERD (gastroesophageal reflux disease)   . Hammer toe of second toe of left foot 04/08/2016   Left 2nd  . Hearing loss   . Hemorrhoids   . Hypercholesterolemia   . Osteoarthritis 12/19/2006   Qualifier: Diagnosis of  By: Julien Girt CMA, Marliss Czar    . Osteoporosis    Past Surgical History:  Procedure Laterality Date  . CATARACT EXTRACTION, BILATERAL  2009  . COLONOSCOPY  2009, 2006 , 2017  . stapes surgery     Dr Thornell Mule  . TONSILLECTOMY AND ADENOIDECTOMY     as a child    Allergies  Allergen Reactions  . Penicillins Anaphylaxis  . Bisphosphonates Other (See Comments)    [name]  . Influenza Vaccines     Pt reported  . Simvastatin     Unknown   . Trazodone And Nefazodone     Felt her throat was closing up/kept her awake  . Sulfonamide Derivatives Rash and Other (See Comments)    blisters    Outpatient  Encounter Medications as of 09/12/2017  Medication Sig  . aspirin 81 MG tablet Take 81 mg by mouth daily.  . calcium carbonate (TUMS EX) 750 MG chewable tablet Chew 750 mg by mouth daily.   . Calcium Carbonate-Vit D-Min (CALCIUM 600+D PLUS MINERALS) 600-400 MG-UNIT TABS Take 1 tablet by mouth daily.   . cholecalciferol (VITAMIN D) 1000 UNITS tablet Take 1,000 Units by mouth daily.  . cyclobenzaprine (FLEXERIL) 5 MG tablet Take 5 mg by mouth at bedtime.  . hydrocortisone cream 1 % Apply 1 application topically 2 (two) times daily as needed. Apply to wrists  . Melatonin 5 MG TABS Take 5 mg by mouth at bedtime.  . naproxen sodium (ALEVE) 220 MG tablet Take 220 mg by mouth at bedtime.  Marland Kitchen omeprazole (PRILOSEC) 20 MG capsule Take 20 mg  by mouth 2 (two) times daily.   . polyethylene glycol (MIRALAX / GLYCOLAX) packet Take 17 g by mouth every other day.   . pregabalin (LYRICA) 25 MG capsule Take 25 mg by mouth 2 (two) times daily.  . [DISCONTINUED] pregabalin (LYRICA) 25 MG capsule Take 1 capsule (25 mg total) by mouth 2 (two) times daily. Take for 1 week, then take 25 mg twice daily for fibromyalgia.   No facility-administered encounter medications on file as of 09/12/2017.     Review of Systems  Constitutional: Positive for chills and fatigue. Negative for appetite change and fever.  HENT: Negative for congestion and mouth sores.   Respiratory: Negative for shortness of breath.   Cardiovascular: Negative for chest pain and palpitations.  Gastrointestinal: Positive for abdominal pain, constipation and nausea. Negative for blood in stool, diarrhea and vomiting.  Genitourinary: Positive for dysuria, frequency and hematuria. Negative for decreased urine volume, pelvic pain, urgency and vaginal discharge.  Musculoskeletal: Positive for back pain and gait problem.  Skin: Negative for wound.  Neurological: Negative for dizziness and headaches.  Psychiatric/Behavioral: Positive for confusion.    Immunization History  Administered Date(s) Administered  . Pneumococcal Conjugate-13 09/01/2015  . Pneumococcal Polysaccharide-23 07/07/1998  . Td 08/21/2009   Pertinent  Health Maintenance Due  Topic Date Due  . PNA vac Low Risk Adult (2 of 2 - PPSV23) 08/31/2016  . DEXA SCAN  Completed  . INFLUENZA VACCINE  Discontinued   Fall Risk  11/05/2016 09/01/2015 08/31/2012  Falls in the past year? Yes Yes No  Number falls in past yr: 2 or more 1 -  Injury with Fall? Yes No -  Risk for fall due to : - Impaired balance/gait -   Functional Status Survey:    Vitals:   09/12/17 0937  BP: 120/76  Pulse: 72  Resp: 18  Temp: 98.3 F (36.8 C)  TempSrc: Oral  SpO2: 96%  Weight: 142 lb 9.6 oz (64.7 kg)  Height: 5\' 2"  (1.575 m)    Body mass index is 26.08 kg/m. Physical Exam  Constitutional: She is oriented to person, place, and time. No distress.  Elderly well built female  HENT:  Head: Normocephalic and atraumatic.  Mouth/Throat: Oropharynx is clear and moist.  Eyes: Pupils are equal, round, and reactive to light. Conjunctivae and EOM are normal.  Neck: Neck supple.  Cardiovascular: Normal rate and regular rhythm.  Pulmonary/Chest: Effort normal and breath sounds normal. No respiratory distress. She has no wheezes. She has no rales.  Abdominal: Soft. Bowel sounds are normal. She exhibits no distension. There is no rebound and no guarding.  Suprapubic tenderness present  Musculoskeletal: Normal range of motion.  She exhibits no edema.  Walker for ambulation  Lymphadenopathy:    She has no cervical adenopathy.  Neurological: She is alert and oriented to person, place, and time.  Skin: Skin is warm and dry. She is not diaphoretic.  Psychiatric: She has a normal mood and affect.    Labs reviewed: Recent Labs    09/15/16 12/30/16 07/26/17  NA 142 140 142  K 4.5 4.1 4.2  CL  --   --  109  CO2  --   --  27  BUN 14 21 16   CREATININE 0.8 0.8 0.8  CALCIUM  --   --  9.0   Recent Labs    09/15/16 12/30/16 07/26/17  AST 18 15 16   ALT 12 9 10   ALKPHOS 111 71 101  ALBUMIN  --   --  3.8   Recent Labs    09/15/16 12/30/16 07/26/17  WBC 9.4 4.7 5.3  HGB 13.3 11.7* 11.9*  HCT 39 34* 35*  PLT 243 247 222   Lab Results  Component Value Date   TSH 2.32 01/13/2017   Lab Results  Component Value Date   HGBA1C 5.8 02/17/2017   Lab Results  Component Value Date   CHOL 197 02/17/2017   HDL 59 02/17/2017   LDLCALC 111 02/17/2017   LDLDIRECT 114.7 08/18/2011   TRIG 153 02/17/2017   CHOLHDL 2 01/23/2015    Significant Diagnostic Results in last 30 days:  No results found.  Assessment/Plan  1. Acute cystitis with hematuria Dysuria with increased urinary frequency, hematuria, nausea and has  suprapubic tenderness. No cva tenderness on exam. Positive for chills and decreased energy level as well. Will send stat u/a with c/s and start her empirically on antibiotic for UTI. Start nitrofurantoin 100 mg q12h for 1 week and f/u on urine culture. Encourage hydration. Monitor vital signs. Cbc and cmp in am. florastor for 2 weeks.   2. Dysuria Send u/a with c/s, see above. Start pyridium 100 mg bid x 3 days, maintain hydraiton and perineal hygiene.     Family/ staff Communication: reviewed care plan with patient and charge nurse.    Labs/tests ordered:  Cbc, cmp  Blanchie Serve, MD Internal Medicine [facility] [address] [place], [place] [address] Cell Phone (Monday-Friday 8 am - 5 pm): [phone] On Call: [phone] and follow prompts after 5 pm and on weekends Office Phone: [phone] Office Fax: [phone]

## 2017-09-13 DIAGNOSIS — D649 Anemia, unspecified: Secondary | ICD-10-CM | POA: Diagnosis not present

## 2017-09-13 DIAGNOSIS — Z79899 Other long term (current) drug therapy: Secondary | ICD-10-CM | POA: Diagnosis not present

## 2017-09-13 DIAGNOSIS — R635 Abnormal weight gain: Secondary | ICD-10-CM | POA: Diagnosis not present

## 2017-09-13 DIAGNOSIS — E7800 Pure hypercholesterolemia, unspecified: Secondary | ICD-10-CM | POA: Diagnosis not present

## 2017-09-13 DIAGNOSIS — R300 Dysuria: Secondary | ICD-10-CM | POA: Diagnosis not present

## 2017-09-13 DIAGNOSIS — J449 Chronic obstructive pulmonary disease, unspecified: Secondary | ICD-10-CM | POA: Diagnosis not present

## 2017-09-13 LAB — HEPATIC FUNCTION PANEL
ALT: 12 (ref 7–35)
AST: 18 (ref 13–35)
Alkaline Phosphatase: 113 (ref 25–125)
BILIRUBIN, TOTAL: 0.4

## 2017-09-13 LAB — BASIC METABOLIC PANEL
BUN: 18 (ref 4–21)
Creatinine: 0.8 (ref 0.5–1.1)
GLUCOSE: 115
Potassium: 4.1 (ref 3.4–5.3)
Sodium: 143 (ref 137–147)

## 2017-09-13 LAB — TSH: TSH: 2.35 (ref 0.41–5.90)

## 2017-09-13 LAB — CBC AND DIFFERENTIAL
HEMATOCRIT: 39 (ref 36–46)
HEMOGLOBIN: 13.1 (ref 12.0–16.0)
PLATELETS: 260 (ref 150–399)
WBC: 7.9

## 2017-09-13 LAB — VITAMIN B12: Vitamin B-12: 366

## 2017-09-13 LAB — IRON,TIBC AND FERRITIN PANEL: Iron: 53

## 2017-09-14 LAB — CMP 10231
Albumin: 4.3
CHLORIDE: 108
CO2: 27
Calcium: 9.9
EGFR (Non-African Amer.): 68
FERRITIN: 52
GLOBULIN: 2.2
TOTAL PROTEIN: 6.5

## 2017-09-28 ENCOUNTER — Encounter: Payer: Self-pay | Admitting: Internal Medicine

## 2017-11-04 ENCOUNTER — Encounter: Payer: Self-pay | Admitting: Internal Medicine

## 2017-11-04 ENCOUNTER — Non-Acute Institutional Stay: Payer: Medicare Other | Admitting: Internal Medicine

## 2017-11-04 DIAGNOSIS — K219 Gastro-esophageal reflux disease without esophagitis: Secondary | ICD-10-CM | POA: Diagnosis not present

## 2017-11-04 DIAGNOSIS — M150 Primary generalized (osteo)arthritis: Secondary | ICD-10-CM

## 2017-11-04 DIAGNOSIS — M797 Fibromyalgia: Secondary | ICD-10-CM | POA: Diagnosis not present

## 2017-11-04 DIAGNOSIS — R442 Other hallucinations: Secondary | ICD-10-CM

## 2017-11-04 DIAGNOSIS — K5901 Slow transit constipation: Secondary | ICD-10-CM | POA: Diagnosis not present

## 2017-11-04 DIAGNOSIS — M159 Polyosteoarthritis, unspecified: Secondary | ICD-10-CM

## 2017-11-04 NOTE — Progress Notes (Signed)
Location:  [place] Room Number: 973 Place of Service:  ALF [phone]) Provider:  Blanchie Serve MD  No primary care provider on file.  Patient Care Team: Mast, Man X, NP as Nurse Practitioner (Internal Medicine)  Extended Emergency Contact Information Primary Emergency Contact: Culbreth,Robin Address: [address] [place], [place] [address] [place] Phone: [phone] Mobile Phone: [phone] Relation: Daughter   Goals of care: Advanced Directive information Advanced Directives 09/12/2017  Does Patient Have a Medical Advance Directive? Yes  Type of Advance Directive Living will  Does patient want to make changes to medical advance directive? No - Patient declined  Copy of [place] in Chart? -  Would patient like information on creating a medical advance directive? -  Pre-existing out of facility DNR order (yellow form or pink MOST form) -     Chief Complaint  Patient presents with  . Medical Management of Chronic Issues    routine 3 month follow up    HPI:  Pt is a 84 y.o. female seen today for medical management of chronic diseases. She mentions seeing insects crawl through her skin, in between her toes and arm pits. She describes them as bugs of different appearance, some white and some black, she mentions that 2 days back she pulled out some leeches and killed them. Denies seeing anything else besides bugs. Denies auditory hallucination. She is taking flexeril for her muscle aches. She is also on pregabalin 25 mg bid for fibromyalgia. She takes omeprazole for her reflux and symptoms are controlled. miralax every other day is helping with her bowel movement.    Past Medical History:  Diagnosis Date  . Acute blood loss anemia 08/23/2013   04/13/16 TSH 1.20, Na 141, K 4.2, Bun 15, creat 0.79, wbc 5.5, Hgb 11.5, plt 283  . Anxiety   . Anxiety state 07/02/2007   Qualifier: Diagnosis of  By: Lenna Gilford MD,  Deborra Medina   . 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 Cigarette smoker   . Colon polyps 2009   TWO TUBULAR ADENOMAS AND HYPERPLASTIC POLYPS  . Constipation 09/14/2013  . COPD (chronic obstructive pulmonary disease) ([place])   . Diverticulosis of colon   . DJD (degenerative joint disease)   . Dog bite of limb 07/14/2010   Dog bite 07/10/10 - dogs shots utd Last td was 08/2009  Localized tx only.    . Fibromyalgia   . GERD 12/19/2006   Qualifier: Diagnosis of  By: Julien Girt CMA, Marliss Czar  04/13/16 TSH 1.20, Na 141, K 4.2, Bun 15, creat 0.79, wbc 5.5, Hgb 11.5, plt 283   . GERD (gastroesophageal reflux disease)   . Hammer toe of second toe of left foot 04/08/2016   Left 2nd  . Hearing loss   . Hemorrhoids   . Hypercholesterolemia   . Osteoarthritis 12/19/2006   Qualifier: Diagnosis of  By: Julien Girt CMA, Marliss Czar    . Osteoporosis    Past Surgical History:  Procedure Laterality Date  . CATARACT EXTRACTION, BILATERAL  2009  . COLONOSCOPY  2009, 2006 , 2017  . stapes surgery     Dr Thornell Mule  . TONSILLECTOMY AND ADENOIDECTOMY     as a child    Allergies  Allergen Reactions  . Penicillins Anaphylaxis  . Bisphosphonates Other (See Comments)    [name]  . Influenza Vaccines     Pt reported  . Simvastatin  Unknown   . Trazodone And Nefazodone     Felt her throat was closing up/kept her awake  . Sulfonamide Derivatives Rash and Other (See Comments)    blisters    Outpatient Encounter Medications as of 11/04/2017  Medication Sig  . aspirin 81 MG tablet Take 81 mg by mouth daily.  . calcium carbonate (TUMS EX) 750 MG chewable tablet Chew 750 mg by mouth daily.   . Calcium Carbonate-Vit D-Min (CALCIUM 600+D PLUS MINERALS) 600-400 MG-UNIT TABS Take 1 tablet by mouth daily.   . cholecalciferol (VITAMIN D) 1000 UNITS tablet Take 1,000 Units by mouth daily.  . cyclobenzaprine (FLEXERIL) 5 MG tablet Take 5 mg by mouth at bedtime.  . hydrocortisone cream 1 % Apply 1 application topically  2 (two) times daily as needed. Apply to wrists  . naproxen sodium (ALEVE) 220 MG tablet Take 220 mg by mouth at bedtime.  Marland Kitchen omeprazole (PRILOSEC) 20 MG capsule Take 20 mg by mouth 2 (two) times daily.   . polyethylene glycol (MIRALAX / GLYCOLAX) packet Take 17 g by mouth every other day.   . pregabalin (LYRICA) 25 MG capsule Take 25 mg by mouth 2 (two) times daily.  . [DISCONTINUED] Melatonin 5 MG TABS Take 5 mg by mouth at bedtime.   No facility-administered encounter medications on file as of 11/04/2017.     Review of Systems  Constitutional: Negative for appetite change, chills, fatigue and fever.  HENT: Positive for hearing loss. Negative for congestion, mouth sores, postnasal drip and trouble swallowing.   Respiratory: Negative for cough and shortness of breath.   Cardiovascular: Negative for chest pain and palpitations.  Gastrointestinal: Positive for constipation. Negative for abdominal pain, diarrhea, nausea and vomiting.       Occasional reflux symptom, had a bowel movement this am  Genitourinary: Negative for dysuria and flank pain.  Musculoskeletal: Positive for back pain and gait problem. Negative for neck pain.  Skin: Negative for rash.  Neurological: Negative for dizziness, tremors, weakness, numbness and headaches.  Psychiatric/Behavioral: Positive for confusion and decreased concentration.    Immunization History  Administered Date(s) Administered  . Pneumococcal Conjugate-13 09/01/2015  . Pneumococcal Polysaccharide-23 07/07/1998  . Td 08/21/2009   Pertinent  Health Maintenance Due  Topic Date Due  . PNA vac Low Risk Adult (2 of 2 - PPSV23) 08/31/2016  . DEXA SCAN  Completed  . INFLUENZA VACCINE  Discontinued   Fall Risk  11/05/2016 09/01/2015 08/31/2012  Falls in the past year? Yes Yes No  Number falls in past yr: 2 or more 1 -  Injury with Fall? Yes No -  Risk for fall due to : - Impaired balance/gait -   Functional Status Survey:    Vitals:   11/04/17  1148  BP: 124/82  Pulse: 72  Resp: 18  Temp: (!) 97.3 F (36.3 C)  SpO2: 96%  Weight: 146 lb 9.6 oz (66.5 kg)  Height: 5\' 2"  (1.575 m)   Body mass index is 26.81 kg/m. Physical Exam  Constitutional: She is oriented to person, place, and time. No distress.  Overweight elderly female  HENT:  Head: Normocephalic and atraumatic.  Nose: Nose normal.  Mouth/Throat: Oropharynx is clear and moist. No oropharyngeal exudate.  Eyes: Pupils are equal, round, and reactive to light. Conjunctivae and EOM are normal. Right eye exhibits no discharge. Left eye exhibits no discharge.  Neck: Normal range of motion. Neck supple.  Cardiovascular: Normal rate and regular rhythm.  Pulmonary/Chest: Effort normal and breath sounds  normal. No respiratory distress. She has no wheezes. She has no rales.  Abdominal: Soft. Bowel sounds are normal. There is no tenderness. There is no guarding.  Musculoskeletal: Normal range of motion. She exhibits edema.  Lymphadenopathy:    She has no cervical adenopathy.  Neurological: She is alert and oriented to person, place, and time. She exhibits normal muscle tone.  Skin: Skin is warm and dry. She is not diaphoretic.  Psychiatric: She has a normal mood and affect. Her behavior is normal.    Labs reviewed: Recent Labs    12/30/16 07/26/17 09/13/17  NA 140 142 143  K 4.1 4.2 4.1  CL  --  109 108  CO2  --  27 27  BUN 21 16 18   CREATININE 0.8 0.8 0.8  CALCIUM  --  9.0 9.9   Recent Labs    12/30/16 07/26/17 09/13/17  AST 15 16 18   ALT 9 10 12   ALKPHOS 71 101 113  PROT  --   --  6.5  ALBUMIN  --  3.8 4.3   Recent Labs    12/30/16 07/26/17 09/13/17  WBC 4.7 5.3 7.9  HGB 11.7* 11.9* 13.1  HCT 34* 35* 39  PLT 247 222 260   Lab Results  Component Value Date   TSH 2.35 09/13/2017   Lab Results  Component Value Date   HGBA1C 5.8 02/17/2017   Lab Results  Component Value Date   CHOL 197 02/17/2017   HDL 59 02/17/2017   LDLCALC 111 02/17/2017    LDLDIRECT 114.7 08/18/2011   TRIG 153 02/17/2017   CHOLHDL 2 01/23/2015    Significant Diagnostic Results in last 30 days:  No results found.  Assessment/Plan  1. Tactile hallucination No clinical symptom of infection. Recent labs stable. Has history of fibromyalgia, denies pain, on pregabalin 25 mg bid. Some reported side effect of hallucination and parasthesia from lyrica. Thus will change to 25 mg daily for 2 week and then stop it. Monitor for symptom of fibromyalgia. If no improvement in symptom, consider low dose risperdal. Pt with similar symptom before, was on risperdal that helped some per nursing.   2. Gastroesophageal reflux disease without esophagitis Continue PPI, no changes made  3. Primary osteoarthritis involving multiple joints Continue calcium and vitamin d, continue aleve  4. Slow transit constipation Continue miralax qod and maintain hydration  5. Fibromyalgia Controlled. Change flexeril to 2.5 mg daily. Taper off lyrica as above and monitor.    Family/ staff Communication: reviewed care plan with patient and charge nurse.    Labs/tests ordered:  Cbc with diff, cmp   Blanchie Serve, MD Internal Medicine [facility] [address] [place], [place] [address] Cell Phone (Monday-Friday 8 am - 5 pm): [phone] On Call: [phone] and follow prompts after 5 pm and on weekends Office Phone: [phone] Office Fax: [phone]

## 2017-11-08 ENCOUNTER — Non-Acute Institutional Stay: Payer: Medicare Other

## 2017-11-08 ENCOUNTER — Telehealth: Payer: Self-pay | Admitting: Adult Health

## 2017-11-08 DIAGNOSIS — Z0000 Encounter for general adult medical examination without abnormal findings: Secondary | ICD-10-CM

## 2017-11-08 DIAGNOSIS — R442 Other hallucinations: Secondary | ICD-10-CM | POA: Diagnosis not present

## 2017-11-08 LAB — CBC AND DIFFERENTIAL
HEMATOCRIT: 33 — AB (ref 36–46)
Hemoglobin: 11.4 — AB (ref 12.0–16.0)
PLATELETS: 234 (ref 150–399)
WBC: 4.5

## 2017-11-08 LAB — HEPATIC FUNCTION PANEL
ALT: 11 (ref 7–35)
AST: 18 (ref 13–35)
Alkaline Phosphatase: 94 (ref 25–125)
Bilirubin, Total: 0.5

## 2017-11-08 LAB — BASIC METABOLIC PANEL
BUN: 17 (ref 4–21)
CREATININE: 0.8 (ref <=1.1)
Glucose: 83
Potassium: 4.3 (ref 3.4–5.3)

## 2017-11-08 NOTE — Patient Instructions (Signed)
Lisa King , Thank you for taking time to come for your Medicare Wellness Visit. I appreciate your ongoing commitment to your health goals. Please review the following plan we discussed and let me know if I can assist you in the future.   Screening recommendations/referrals: Colonoscopy excluded, over age 82 Mammogram excluded, over age 61 Bone Density up to date Recommended yearly ophthalmology/optometry visit for glaucoma screening and checkup Recommended yearly dental visit for hygiene and checkup  Vaccinations: Influenza vaccine excluded Pneumococcal vaccine up to date, completed Tdap vaccine up to date, due 02/16/2019 Shingles vaccine not in past records    Advanced directives: in chart  Conditions/risks identified: none  Next appointment: Dr. Bubba Camp makes rounds   Preventive Care 16 Years and Older, Female Preventive care refers to lifestyle choices and visits with your health care provider that can promote health and wellness. What does preventive care include?  A yearly physical exam. This is also called an annual well check.  Dental exams once or twice a year.  Routine eye exams. Ask your health care provider how often you should have your eyes checked.  Personal lifestyle choices, including:  Daily care of your teeth and gums.  Regular physical activity.  Eating a healthy diet.  Avoiding tobacco and drug use.  Limiting alcohol use.  Practicing safe sex.  Taking low-dose aspirin every day.  Taking vitamin and mineral supplements as recommended by your health care provider. What happens during an annual well check? The services and screenings done by your health care provider during your annual well check will depend on your age, overall health, lifestyle risk factors, and family history of disease. Counseling  Your health care provider may ask you questions about your:  Alcohol use.  Tobacco use.  Drug use.  Emotional well-being.  Home and  relationship well-being.  Sexual activity.  Eating habits.  History of falls.  Memory and ability to understand (cognition).  Work and work Statistician.  Reproductive health. Screening  You may have the following tests or measurements:  Height, weight, and BMI.  Blood pressure.  Lipid and cholesterol levels. These may be checked every 5 years, or more frequently if you are over 28 years old.  Skin check.  Lung cancer screening. You may have this screening every year starting at age 69 if you have a 30-pack-year history of smoking and currently smoke or have quit within the past 15 years.  Fecal occult blood test (FOBT) of the stool. You may have this test every year starting at age 54.  Flexible sigmoidoscopy or colonoscopy. You may have a sigmoidoscopy every 5 years or a colonoscopy every 10 years starting at age 22.  Hepatitis C blood test.  Hepatitis B blood test.  Sexually transmitted disease (STD) testing.  Diabetes screening. This is done by checking your blood sugar (glucose) after you have not eaten for a while (fasting). You may have this done every 1-3 years.  Bone density scan. This is done to screen for osteoporosis. You may have this done starting at age 63.  Mammogram. This may be done every 1-2 years. Talk to your health care provider about how often you should have regular mammograms. Talk with your health care provider about your test results, treatment options, and if necessary, the need for more tests. Vaccines  Your health care provider may recommend certain vaccines, such as:  Influenza vaccine. This is recommended every year.  Tetanus, diphtheria, and acellular pertussis (Tdap, Td) vaccine. You may need a  Td booster every 10 years.  Zoster vaccine. You may need this after age 50.  Pneumococcal 13-valent conjugate (PCV13) vaccine. One dose is recommended after age 47.  Pneumococcal polysaccharide (PPSV23) vaccine. One dose is recommended after  age 46. Talk to your health care provider about which screenings and vaccines you need and how often you need them. This information is not intended to replace advice given to you by your health care provider. Make sure you discuss any questions you have with your health care provider. Document Released: 02/28/2015 Document Revised: 10/22/2015 Document Reviewed: 12/03/2014 Elsevier Interactive Patient Education  2017 [place] Prevention in the Home Falls can cause injuries. They can happen to people of all ages. There are many things you can do to make your home safe and to help prevent falls. What can I do on the outside of my home?  Regularly fix the edges of walkways and driveways and fix any cracks.  Remove anything that might make you trip as you walk through a door, such as a raised step or threshold.  Trim any bushes or trees on the path to your home.  Use bright outdoor lighting.  Clear any walking paths of anything that might make someone trip, such as rocks or tools.  Regularly check to see if handrails are loose or broken. Make sure that both sides of any steps have handrails.  Any raised decks and porches should have guardrails on the edges.  Have any leaves, snow, or ice cleared regularly.  Use sand or salt on walking paths during winter.  Clean up any spills in your garage right away. This includes oil or grease spills. What can I do in the bathroom?  Use night lights.  Install grab bars by the toilet and in the tub and shower. Do not use towel bars as grab bars.  Use non-skid mats or decals in the tub or shower.  If you need to sit down in the shower, use a plastic, non-slip stool.  Keep the floor dry. Clean up any water that spills on the floor as soon as it happens.  Remove soap buildup in the tub or shower regularly.  Attach bath mats securely with double-sided non-slip rug tape.  Do not have throw rugs and other things on the floor that can  make you trip. What can I do in the bedroom?  Use night lights.  Make sure that you have a light by your bed that is easy to reach.  Do not use any sheets or blankets that are too big for your bed. They should not hang down onto the floor.  Have a firm chair that has side arms. You can use this for support while you get dressed.  Do not have throw rugs and other things on the floor that can make you trip. What can I do in the kitchen?  Clean up any spills right away.  Avoid walking on wet floors.  Keep items that you use a lot in easy-to-reach places.  If you need to reach something above you, use a strong step stool that has a grab bar.  Keep electrical cords out of the way.  Do not use floor polish or wax that makes floors slippery. If you must use wax, use non-skid floor wax.  Do not have throw rugs and other things on the floor that can make you trip. What can I do with my stairs?  Do not leave any items on the stairs.  Make sure that there are handrails on both sides of the stairs and use them. Fix handrails that are broken or loose. Make sure that handrails are as long as the stairways.  Check any carpeting to make sure that it is firmly attached to the stairs. Fix any carpet that is loose or worn.  Avoid having throw rugs at the top or bottom of the stairs. If you do have throw rugs, attach them to the floor with carpet tape.  Make sure that you have a light switch at the top of the stairs and the bottom of the stairs. If you do not have them, ask someone to add them for you. What else can I do to help prevent falls?  Wear shoes that:  Do not have high heels.  Have rubber bottoms.  Are comfortable and fit you well.  Are closed at the toe. Do not wear sandals.  If you use a stepladder:  Make sure that it is fully opened. Do not climb a closed stepladder.  Make sure that both sides of the stepladder are locked into place.  Ask someone to hold it for you,  if possible.  Clearly mark and make sure that you can see:  Any grab bars or handrails.  First and last steps.  Where the edge of each step is.  Use tools that help you move around (mobility aids) if they are needed. These include:  Canes.  Walkers.  Scooters.  Crutches.  Turn on the lights when you go into a dark area. Replace any light bulbs as soon as they burn out.  Set up your furniture so you have a clear path. Avoid moving your furniture around.  If any of your floors are uneven, fix them.  If there are any pets around you, be aware of where they are.  Review your medicines with your doctor. Some medicines can make you feel dizzy. This can increase your chance of falling. Ask your doctor what other things that you can do to help prevent falls. This information is not intended to replace advice given to you by your health care provider. Make sure you discuss any questions you have with your health care provider. Document Released: 11/28/2008 Document Revised: 07/10/2015 Document Reviewed: 03/08/2014 Elsevier Interactive Patient Education  2017 [employer].

## 2017-11-08 NOTE — Telephone Encounter (Signed)
I was notified by Rise Paganini at [facility] that Ms. Boxer was having chest pain at 7pm on and off for about 1 hr after dinner. The pain is in the middle of her chest and does not radiate. There is no associated sob, nausea, or diaphoresis. The nurse reports that she refuses to go to the ER and thinks it is reflux. Her VS were stable except her BP was elevated in the 180's which is unusual for her. I instructed the nurse to let her know that the ER is the best option given the new onset HTN with the mid sternal chest pain but that if she continued to refuse we should give her one dose of nitro 0.4 mg SL and recheck her BP in 15 min to ensure that it has gone down. The nurse was instructed to call back for continued symptoms and will leave an SBAR for the NP to see her in the morning.

## 2017-11-08 NOTE — Progress Notes (Signed)
Subjective:   Lisa King is a 84 y.o. female who presents for Medicare Annual (Subsequent) preventive examination at [facility]-11/05/2016    Objective:     Vitals: BP 130/78 (BP Location: Left Arm, Patient Position: Sitting)   Pulse 74   Temp 97.8 F (36.6 C) (Oral)   Ht 5\' 2"  (1.575 m)   Wt 146 lb (66.2 kg)   BMI 26.70 kg/m   Body mass index is 26.7 kg/m.  Advanced Directives 11/08/2017 09/12/2017 09/08/2017 07/21/2017 04/21/2017 02/17/2017 01/21/2017  Does Patient Have a Medical Advance Directive? Yes Yes Yes Yes Yes No No  Type of Advance Directive Living will Living will Living will Living will Living will - -  Does patient want to make changes to medical advance directive? No - Patient declined No - Patient declined No - Patient declined No - Patient declined No - Patient declined - -  Copy of [place] in [place]  Would patient like information on creating a medical advance directive? - - - No - Patient declined - No - Patient declined No - Patient declined  Pre-existing out of facility DNR order (yellow form or pink MOST form) - - - - - - -    Tobacco Social History   Tobacco Use  Smoking Status Former Smoker  . Types: Cigarettes  . Last attempt to quit: 02/15/2010  . Years since quitting: 7.7  Smokeless Tobacco Never Used     Counseling given: Not Answered   Clinical Intake:  Pre-visit preparation completed: No  Pain : No/denies pain     Diabetes: No  How often do you need to have someone help you when you read instructions, pamphlets, or other written materials from your doctor or pharmacy?: 2 - Rarely What is the last grade level you completed in school?: High School  Interpreter Needed?: No  Information entered by :: Tyson Dense, RN  Past Medical History:  Diagnosis Date  . Acute blood loss anemia 08/23/2013   04/13/16 TSH 1.20, Na 141, K 4.2, Bun 15, creat 0.79, wbc 5.5, Hgb 11.5,  plt 283  . Anxiety   . Anxiety state 07/02/2007   Qualifier: Diagnosis of  By: Lenna Gilford MD, Deborra Medina   . 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 Cigarette smoker   . Colon polyps 2009   TWO TUBULAR ADENOMAS AND HYPERPLASTIC POLYPS  . Constipation 09/14/2013  . COPD (chronic obstructive pulmonary disease) ([place])   . Diverticulosis of colon   . DJD (degenerative joint disease)   . Dog bite of limb 07/14/2010   Dog bite 07/10/10 - dogs shots utd Last td was 08/2009  Localized tx only.    . Fibromyalgia   . GERD 12/19/2006   Qualifier: Diagnosis of  By: Julien Girt CMA, Marliss Czar  04/13/16 TSH 1.20, Na 141, K 4.2, Bun 15, creat 0.79, wbc 5.5, Hgb 11.5, plt 283   . GERD (gastroesophageal reflux disease)   . Hammer toe of second toe of left foot 04/08/2016   Left 2nd  . Hearing loss   . Hemorrhoids   . Hypercholesterolemia   . Osteoarthritis 12/19/2006   Qualifier: Diagnosis of  By: Julien Girt CMA, Marliss Czar    . Osteoporosis    Past Surgical History:  Procedure Laterality Date  . CATARACT EXTRACTION, BILATERAL  2009  . COLONOSCOPY  2009, 2006 , 2017  . stapes surgery     Dr Thornell Mule  .  TONSILLECTOMY AND ADENOIDECTOMY     as a child   Family History  Problem Relation Age of Onset  . Heart disease Father   . COPD Father   . Hypertension Mother   . Arthritis Mother    Social History   Socioeconomic History  . Marital status: Widowed    Spouse name: Not on file  . Number of children: Not on file  . Years of education: Not on file  . Highest education level: Not on file  Occupational History  . Occupation: retired  . Occupation: works some weekends at [employer]  . Financial resource strain: Not hard at all  . Food insecurity:    Worry: Never true    Inability: Never true  . Transportation needs:    Medical: No    Non-medical: No  Tobacco Use  . Smoking status: Former Smoker    Types: Cigarettes    Last attempt to quit: 02/15/2010    Years since quitting: 7.7    . Smokeless tobacco: Never Used  Substance and Sexual Activity  . Alcohol use: Yes    Comment: occasional glass of wine  . Drug use: No  . Sexual activity: Never  Lifestyle  . Physical activity:    Days per week: 5 days    Minutes per session: 30 min  . Stress: Not at all  Relationships  . Social connections:    Talks on phone: More than three times a week    Gets together: Twice a week    Attends religious service: Never    Active member of club or organization: No    Attends meetings of clubs or organizations: Never    Relationship status: Widowed  Other Topics Concern  . Not on file  Social History Narrative   Pt works some weekends at [employer]   Caffeine use - daily coffee in the mornings   Patient gets regular exercise > tries to walk daily for exercise   Moved to [place] 03/11/16   Widowed   Former Smoker-stopped 2012   Alcohol occasionally glass of wine   Living Will    Outpatient Encounter Medications as of 11/08/2017  Medication Sig  . aspirin 81 MG tablet Take 81 mg by mouth daily.  . calcium carbonate (TUMS EX) 750 MG chewable tablet Chew 750 mg by mouth daily.   . Calcium Carbonate-Vit D-Min (CALCIUM 600+D PLUS MINERALS) 600-400 MG-UNIT TABS Take 1 tablet by mouth daily.   . cholecalciferol (VITAMIN D) 1000 UNITS tablet Take 1,000 Units by mouth daily.  . cyclobenzaprine (FLEXERIL) 5 MG tablet Take 5 mg by mouth at bedtime.  . hydrocortisone cream 1 % Apply 1 application topically 2 (two) times daily as needed. Apply to wrists  . naproxen sodium (ALEVE) 220 MG tablet Take 220 mg by mouth at bedtime.  Marland Kitchen omeprazole (PRILOSEC) 20 MG capsule Take 20 mg by mouth 2 (two) times daily.   . polyethylene glycol (MIRALAX / GLYCOLAX) packet Take 17 g by mouth every other day.   . pregabalin (LYRICA) 25 MG capsule Take 25 mg by mouth 2 (two) times daily.   No facility-administered encounter medications on file as of 11/08/2017.     Activities of  Daily Living In your present state of health, do you have any difficulty performing the following activities: 11/08/2017  Hearing? N  Vision? N  Difficulty concentrating or making decisions? N  Walking or climbing stairs? Y  Dressing or  bathing? N  Doing errands, shopping? Y  Preparing Food and eating ? N  Using the Toilet? N  In the past six months, have you accidently leaked urine? Y  Do you have problems with loss of bowel control? Y  Managing your Medications? Y  Managing your Finances? Y  Housekeeping or managing your Housekeeping? Y  Some recent data might be hidden    Patient Care Team: Mast, Man X, NP as Nurse Practitioner (Internal Medicine)    Assessment:   This is a routine wellness examination for [place].  Exercise Activities and Dietary recommendations Current Exercise Habits: Structured exercise class, Type of exercise: Other - see comments;treadmill(chair fit classes), Time (Minutes): 30, Frequency (Times/Week): 5, Weekly Exercise (Minutes/Week): 150, Intensity: Mild, Exercise limited by: orthopedic condition(s)  Goals    . Increase water intake     Patient would like to increase water intake.       Fall Risk Fall Risk  11/08/2017 11/05/2016 09/01/2015 08/31/2012  Falls in the past year? No Yes Yes No  Number falls in past yr: - 2 or more 1 -  Injury with Fall? - Yes No -  Risk for fall due to : - - Impaired balance/gait -   Is the patient's home free of loose throw rugs in walkways, pet beds, electrical cords, etc?   yes      Grab bars in the bathroom? yes      Handrails on the stairs?   yes      Adequate lighting?   yes  Depression Screen PHQ 2/9 Scores 11/08/2017 11/05/2016 09/01/2015 08/31/2012  PHQ - 2 Score 0 0 0 0     Cognitive Function MMSE - Mini Mental State Exam 11/08/2017 11/05/2016 05/12/2016  Orientation to time 5 5 4   Orientation to Place 5 5 5   Registration 3 3 3   Attention/ Calculation 5 5 5   Recall 1 2 1   Language- name 2 objects 2 2 2    Language- repeat 1 1 1   Language- follow 3 step command 3 3 3   Language- read & follow direction 1 1 1   Write a sentence 1 1 1   Copy design 1 1 1   Total score 28 29 27         Immunization History  Administered Date(s) Administered  . Pneumococcal Conjugate-13 09/01/2015  . Pneumococcal Polysaccharide-23 07/07/1998  . Td 08/21/2009    Qualifies for Shingles Vaccine? Not in past records  Screening Tests Health Maintenance  Topic Date Due  . PNA vac Low Risk Adult (2 of 2 - PPSV23) 08/31/2016  . TETANUS/TDAP  08/22/2019  . DEXA SCAN  Completed  . INFLUENZA VACCINE  Discontinued    Cancer Screenings: Lung: Low Dose CT Chest recommended if Age 73-80 years, 30 pack-year currently smoking OR have quit w/in 15years. Patient does not qualify. Breast:  Up to date on Mammogram? Yes   Up to date of Bone Density/Dexa? Yes Colorectal: up to date  Additional Screenings: Hepatitis C Screening: declined     Plan:    I have personally reviewed and addressed the Medicare Annual Wellness questionnaire and have noted the following in the patient's chart:  A. Medical and social history B. Use of alcohol, tobacco or illicit drugs  C. Current medications and supplements D. Functional ability and status E.  Nutritional status F.  Physical activity G. Advance directives H. List of other physicians I.  Hospitalizations, surgeries, and ER visits in previous 12 months J.  Vitals K. Screenings to include  hearing, vision, cognitive, depression L. Referrals and appointments - none  In addition, I have reviewed and discussed with patient certain preventive protocols, quality metrics, and best practice recommendations. A written personalized care plan for preventive services as well as general preventive health recommendations were provided to patient.  See attached scanned questionnaire for additional information.   Signed,   Tyson Dense, RN Nurse Health Advisor  Patient Concerns:  States she has bug bites all over her arms and hands from her bed

## 2017-11-09 ENCOUNTER — Other Ambulatory Visit: Payer: Self-pay | Admitting: *Deleted

## 2017-11-09 ENCOUNTER — Non-Acute Institutional Stay: Payer: Medicare Other | Admitting: Nurse Practitioner

## 2017-11-09 ENCOUNTER — Encounter: Payer: Self-pay | Admitting: Nurse Practitioner

## 2017-11-09 DIAGNOSIS — R0789 Other chest pain: Secondary | ICD-10-CM | POA: Insufficient documentation

## 2017-11-09 DIAGNOSIS — K219 Gastro-esophageal reflux disease without esophagitis: Secondary | ICD-10-CM | POA: Diagnosis not present

## 2017-11-09 DIAGNOSIS — M159 Polyosteoarthritis, unspecified: Secondary | ICD-10-CM

## 2017-11-09 DIAGNOSIS — I10 Essential (primary) hypertension: Secondary | ICD-10-CM

## 2017-11-09 DIAGNOSIS — M150 Primary generalized (osteo)arthritis: Secondary | ICD-10-CM

## 2017-11-09 LAB — COMPLETE METABOLIC PANEL WITH GFR
Albumin: 3.7
CHLORIDE: 106
CO2: 28
Calcium: 9.1
GFR CALC NON AF AMER: 70
GLOBULIN: 1.5
Total Protein: 5.2 g/dL

## 2017-11-09 NOTE — Assessment & Plan Note (Addendum)
May consider EKG if recurrence, monitor VS q shift x 72 hours since the patient had elevated Sbp with the onset of chest pain/reflux symptoms. Likely GI etiology.  Observe.

## 2017-11-09 NOTE — Assessment & Plan Note (Signed)
Will change Naproxen 220mg  at dinner time with food. Observe.

## 2017-11-09 NOTE — Progress Notes (Addendum)
Location:  [facility] Room Number: 275 Place of Service:  ALF [phone]) Provider:  Helaman Mecca, Lennie Odor  NP  Blanchie Serve, MD  Patient Care Team: Blanchie Serve, MD as PCP - General (Internal Medicine) Damarko Stitely X, NP as Nurse Practitioner (Internal Medicine)  Extended Emergency Contact Information Primary Emergency Contact: Culbreth,Robin Address: [address] [place], [place] [address] [place] Phone: [phone] Mobile Phone: [phone] Relation: Daughter  Code Status: DNR Goals of care: Advanced Directive information Advanced Directives 11/08/2017  Does Patient Have a Medical Advance Directive? Yes  Type of Advance Directive Living will  Does patient want to make changes to medical advance directive? No - Patient declined  Copy of [place] in Chart? -  Would patient like information on creating a medical advance directive? -  Pre-existing out of facility DNR order (yellow form or pink MOST form) -     Chief Complaint  Patient presents with  . Acute Visit    Chest pain, elevated BP    HPI:  Pt is a 84 y.o. female seen today for an acute visit for chest pain and elevated Bp around 7 pm after Naproxen, the patient stated it was her indigestion, lasted about 15 minutes, resolved,  s/p NTG x1, Bp 140/49mmHg after ward. She denied diaphoresis, sense of doom, SOB, pain was localized in upper epigastric/chest area, she denied dizziness, cough, palpitation, nausea, vomiting, or focal weakness. Hx of GERD on Omeprazole 20mg  bid.    Past Medical History:  Diagnosis Date  . Acute blood loss anemia 08/23/2013   04/13/16 TSH 1.20, Na 141, K 4.2, Bun 15, creat 0.79, wbc 5.5, Hgb 11.5, plt 283  . Anxiety   . Anxiety state 07/02/2007   Qualifier: Diagnosis of  By: Lenna Gilford MD, Deborra Medina   . 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 Cigarette smoker   . Colon polyps 2009   TWO TUBULAR ADENOMAS AND HYPERPLASTIC  POLYPS  . Constipation 09/14/2013  . COPD (chronic obstructive pulmonary disease) ([place])   . Diverticulosis of colon   . DJD (degenerative joint disease)   . Dog bite of limb 07/14/2010   Dog bite 07/10/10 - dogs shots utd Last td was 08/2009  Localized tx only.    . Fibromyalgia   . GERD 12/19/2006   Qualifier: Diagnosis of  By: Julien Girt CMA, Marliss Czar  04/13/16 TSH 1.20, Na 141, K 4.2, Bun 15, creat 0.79, wbc 5.5, Hgb 11.5, plt 283   . GERD (gastroesophageal reflux disease)   . Hammer toe of second toe of left foot 04/08/2016   Left 2nd  . Hearing loss   . Hemorrhoids   . Hypercholesterolemia   . Osteoarthritis 12/19/2006   Qualifier: Diagnosis of  By: Julien Girt CMA, Marliss Czar    . Osteoporosis    Past Surgical History:  Procedure Laterality Date  . CATARACT EXTRACTION, BILATERAL  2009  . COLONOSCOPY  2009, 2006 , 2017  . stapes surgery     Dr Thornell Mule  . TONSILLECTOMY AND ADENOIDECTOMY     as a child    Allergies  Allergen Reactions  . Penicillins Anaphylaxis  . Bisphosphonates Other (See Comments)    [name]  . Influenza Vaccines     Pt reported  . Simvastatin     Unknown   . Trazodone And Nefazodone     Felt her throat was closing up/kept her awake  . Sulfonamide  Derivatives Rash and Other (See Comments)    blisters    Outpatient Encounter Medications as of 11/09/2017  Medication Sig  . aspirin 81 MG tablet Take 81 mg by mouth daily.  . calcium carbonate (TUMS EX) 750 MG chewable tablet Chew 750 mg by mouth daily.   . Calcium Carbonate-Vit D-Min (CALCIUM 600+D PLUS MINERALS) 600-400 MG-UNIT TABS Take 1 tablet by mouth daily.   . cholecalciferol (VITAMIN D) 1000 UNITS tablet Take 1,000 Units by mouth daily.  . cyclobenzaprine (FLEXERIL) 5 MG tablet Take 2.5 mg by mouth at bedtime.  . hydrocortisone cream 1 % Apply 1 application topically 2 (two) times daily as needed. Apply to wrists  . naproxen sodium (ALEVE) 220 MG tablet Take 220 mg by mouth at bedtime.  Marland Kitchen omeprazole  (PRILOSEC) 20 MG capsule Take 20 mg by mouth 2 (two) times daily.   . polyethylene glycol (MIRALAX / GLYCOLAX) packet Take 17 g by mouth every other day.   . [DISCONTINUED] cyclobenzaprine (FLEXERIL) 5 MG tablet Take 5 mg by mouth at bedtime.  . [DISCONTINUED] pregabalin (LYRICA) 25 MG capsule Take 25 mg by mouth 2 (two) times daily.   No facility-administered encounter medications on file as of 11/09/2017.     Review of Systems  Constitutional: Negative for activity change, appetite change, chills, diaphoresis, fatigue and fever.  HENT: Positive for hearing loss. Negative for congestion and voice change.   Respiratory: Negative for cough, shortness of breath and wheezing.   Cardiovascular: Positive for leg swelling. Negative for palpitations.       11/08/17 around 7 pm "chest pain"  Gastrointestinal: Negative for abdominal distention, abdominal pain, constipation, diarrhea, nausea and vomiting.       Reflux symptoms.   Genitourinary: Negative for difficulty urinating, dysuria, hematuria and urgency.  Musculoskeletal: Positive for back pain and gait problem.  Skin: Negative for color change and pallor.  Neurological: Negative for dizziness, speech difficulty, weakness and headaches.       Memory lapses.   Psychiatric/Behavioral: Negative for agitation, behavioral problems, hallucinations and sleep disturbance. The patient is not nervous/anxious.     Immunization History  Administered Date(s) Administered  . Pneumococcal Conjugate-13 09/01/2015  . Pneumococcal Polysaccharide-23 07/07/1998  . Td 08/21/2009   Pertinent  Health Maintenance Due  Topic Date Due  . PNA vac Low Risk Adult (2 of 2 - PPSV23) 08/31/2016  . DEXA SCAN  Completed  . INFLUENZA VACCINE  Discontinued   Fall Risk  11/08/2017 11/05/2016 09/01/2015 08/31/2012  Falls in the past year? No Yes Yes No  Number falls in past yr: - 2 or more 1 -  Injury with Fall? - Yes No -  Risk for fall due to : - - Impaired balance/gait -    Functional Status Survey:    Vitals:   11/09/17 1201  BP: 137/70  Pulse: 62  Resp: 20  Temp: (!) 96.9 F (36.1 C)  SpO2: 97%  Weight: 146 lb 9.6 oz (66.5 kg)  Height: 5\' 2"  (1.575 m)   Body mass index is 26.81 kg/m. Physical Exam  Constitutional: She appears well-developed and well-nourished.  HENT:  Head: Normocephalic and atraumatic.  Eyes: Pupils are equal, round, and reactive to light. EOM are normal.  Neck: Normal range of motion. Neck supple. No JVD present. No thyromegaly present.  Cardiovascular: Normal rate and regular rhythm.  No murmur heard. Pulmonary/Chest: Effort normal. She has no wheezes. She has no rales.  Abdominal: Soft. She exhibits no distension. There is no  tenderness. There is no rebound and no guarding.  Musculoskeletal: She exhibits edema.  Trace edema. Ambulates with walker.   Neurological: She is alert. No cranial nerve deficit. She exhibits normal muscle tone. Coordination normal.  Oriented to person and place.   Skin: Skin is warm and dry.  Psychiatric: She has a normal mood and affect. Her behavior is normal.    Labs reviewed: Recent Labs    12/30/16 07/26/17 09/13/17 11/08/17  NA 140 142 143  --   K 4.1 4.2 4.1 4.3  CL  --  109 108 106  CO2  --  27 27 28   BUN 21 16 18 17   CREATININE 0.8 0.8 0.8 0.8  CALCIUM  --  9.0 9.9 9.1   Recent Labs    07/26/17 09/13/17 11/08/17  AST 16 18 18   ALT 10 12 11   ALKPHOS 101 113 94  PROT  --  6.5 5.2  ALBUMIN 3.8 4.3 3.7   Recent Labs    07/26/17 09/13/17 11/08/17  WBC 5.3 7.9 4.5  HGB 11.9* 13.1 11.4*  HCT 35* 39 33*  PLT 222 260 234   Lab Results  Component Value Date   TSH 2.35 09/13/2017   Lab Results  Component Value Date   HGBA1C 5.8 02/17/2017   Lab Results  Component Value Date   CHOL 197 02/17/2017   HDL 59 02/17/2017   LDLCALC 111 02/17/2017   LDLDIRECT 114.7 08/18/2011   TRIG 153 02/17/2017   CHOLHDL 2 01/23/2015    Significant Diagnostic Results in last 30  days:  No results found.  Assessment/Plan GERD chest pain and elevated Bp around 7 pm after Naproxen, the patient stated it was her indigestion, lasted about 15 minutes, resolved,  s/p NTG x1, Bp 140/5mmHg after ward. She denied diaphoresis, sense of doom, SOB, pain was localized in upper epigastric/chest area, she denied dizziness, cough, palpitation, nausea, vomiting, or focal weakness. Hx of GERD, conitnue Omeprazole 20mg  bid.    Osteoarthritis Will change Naproxen 220mg  at dinner time with food. Observe.   Atypical chest pain May consider EKG if recurrence, monitor VS q shift x 72 hours since the patient had elevated Sbp with the onset of chest pain/reflux symptoms. Likely GI etiology.  Observe.   Hypertension 11/15/17 Sbp 142, 160, 150, 148, 150, 150, 146, 156, 150, 148, 160, Dbp: 76, 74, 80, 70, 86, 90, 78, 88, 80, 72, 100. Will adding Lisinopril 2.5mg  po qd, VS q shift x7 days, BMP one week       Family/ staff Communication: plan of care reviewed with the patient and charge nurse.   Labs/tests ordered:  none  Time spend 25 minutes.

## 2017-11-09 NOTE — Assessment & Plan Note (Signed)
chest pain and elevated Bp around 7 pm after Naproxen, the patient stated it was her indigestion, lasted about 15 minutes, resolved,  s/p NTG x1, Bp 140/36mmHg after ward. She denied diaphoresis, sense of doom, SOB, pain was localized in upper epigastric/chest area, she denied dizziness, cough, palpitation, nausea, vomiting, or focal weakness. Hx of GERD, conitnue Omeprazole 20mg  bid.

## 2017-11-10 ENCOUNTER — Encounter: Payer: Self-pay | Admitting: Nurse Practitioner

## 2017-11-15 DIAGNOSIS — I10 Essential (primary) hypertension: Secondary | ICD-10-CM | POA: Insufficient documentation

## 2017-11-15 NOTE — Assessment & Plan Note (Signed)
11/15/17 Sbp 142, 160, 150, 148, 150, 150, 146, 156, 150, 148, 160, Dbp: 76, 74, 80, 70, 86, 90, 78, 88, 80, 72, 100. Will adding Lisinopril 2.5mg  po qd, VS q shift x7 days, BMP one week

## 2017-11-22 DIAGNOSIS — I10 Essential (primary) hypertension: Secondary | ICD-10-CM | POA: Diagnosis not present

## 2017-11-22 LAB — BASIC METABOLIC PANEL
BUN: 13 (ref 4–21)
CREATININE: 0.8 (ref <=1.1)
Glucose: 86
Potassium: 4.3 (ref 3.4–5.3)
Sodium: 142 (ref 137–147)

## 2017-11-23 ENCOUNTER — Other Ambulatory Visit: Payer: Self-pay | Admitting: *Deleted

## 2017-11-23 LAB — BASIC METABOLIC PANEL
Calcium: 9.4
Carbon Dioxide, Total: 28
Chloride: 107
GFR CALC NON AF AMER: 70

## 2017-12-06 ENCOUNTER — Encounter: Payer: Self-pay | Admitting: Nurse Practitioner

## 2017-12-06 ENCOUNTER — Non-Acute Institutional Stay: Payer: Medicare Other | Admitting: Nurse Practitioner

## 2017-12-06 ENCOUNTER — Telehealth: Payer: Self-pay | Admitting: *Deleted

## 2017-12-06 DIAGNOSIS — R442 Other hallucinations: Secondary | ICD-10-CM | POA: Diagnosis not present

## 2017-12-06 DIAGNOSIS — M797 Fibromyalgia: Secondary | ICD-10-CM | POA: Diagnosis not present

## 2017-12-06 DIAGNOSIS — F411 Generalized anxiety disorder: Secondary | ICD-10-CM

## 2017-12-06 DIAGNOSIS — I10 Essential (primary) hypertension: Secondary | ICD-10-CM

## 2017-12-06 NOTE — Assessment & Plan Note (Signed)
Blood pressure is in control, continue Lisinopril 2.5mg  po daily.

## 2017-12-06 NOTE — Assessment & Plan Note (Signed)
Better control of pain/aches may help her night sleep. Observe.

## 2017-12-06 NOTE — Telephone Encounter (Signed)
This telephone note was opened in error.

## 2017-12-06 NOTE — Assessment & Plan Note (Signed)
Persists, livable, the patient is managed with original Listerine.

## 2017-12-06 NOTE — Progress Notes (Signed)
Location:  [facility] Room Number: 540 Place of Service:  ALF (13) Provider:  Marlana Latus  NP  Jernie Schutt X, NP  Patient Care Team: Devlin Mcveigh X, NP as PCP - General (Internal Medicine) Tayva Easterday X, NP as Nurse Practitioner (Internal Medicine)  Extended Emergency Contact Information Primary Emergency Contact: Culbreth,Robin Address: [address] [place], [place] [address] [place] Phone: [phone] Mobile Phone: [phone] Relation: Daughter  Code Status:  Full Code Goals of care: Advanced Directive information Advanced Directives 12/06/2017  Does Patient Have a Medical Advance Directive? Yes  Type of Advance Directive Living will  Does patient want to make changes to medical advance directive? No - Patient declined  Copy of [place] in Chart? -  Would patient like information on creating a medical advance directive? -  Pre-existing out of facility DNR order (yellow form or pink MOST form) -     Chief Complaint  Patient presents with  . Acute Visit    Pain in legs    HPI:  Pt is a 84 y.o. female seen today for an acute visit for worsened aches/pains allover especially in legs with cramps/cricks as the experience of Fibromyalgia in history since Lyrica was decreased to 25mg  qd 11/04/17 x 2 weeks, then dc'd, flexeril was reduced to 2.5mg  qd 11/07/17 to eliminate possible etiology of "bug crawling under her skin". She stated she has trouble falling and staying asleep now.  Naproxen 220mg  daily added 11/09/17 for pain management. Lisinopril 2.5mg  qd started 11/15/17 for blood pressure control. She stated her mite/bugs on skin situation persisted since the above medication change.    Past Medical History:  Diagnosis Date  . Acute blood loss anemia 08/23/2013   04/13/16 TSH 1.20, Na 141, K 4.2, Bun 15, creat 0.79, wbc 5.5, Hgb 11.5, plt 283  . Anxiety   . Anxiety state 07/02/2007   Qualifier: Diagnosis  of  By: Lenna Gilford MD, Deborra Medina   . 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 Cigarette smoker   . Colon polyps 2009   TWO TUBULAR ADENOMAS AND HYPERPLASTIC POLYPS  . Constipation 09/14/2013  . COPD (chronic obstructive pulmonary disease) ([facility])   . Diverticulosis of colon   . DJD (degenerative joint disease)   . Dog bite of limb 07/14/2010   Dog bite 07/10/10 - dogs shots utd Last td was 08/2009  Localized tx only.    . Fibromyalgia   . GERD 12/19/2006   Qualifier: Diagnosis of  By: Julien Girt CMA, Marliss Czar  04/13/16 TSH 1.20, Na 141, K 4.2, Bun 15, creat 0.79, wbc 5.5, Hgb 11.5, plt 283   . GERD (gastroesophageal reflux disease)   . Hammer toe of second toe of left foot 04/08/2016   Left 2nd  . Hearing loss   . Hemorrhoids   . Hypercholesterolemia   . Osteoarthritis 12/19/2006   Qualifier: Diagnosis of  By: Julien Girt CMA, Marliss Czar    . Osteoporosis    Past Surgical History:  Procedure Laterality Date  . CATARACT EXTRACTION, BILATERAL  2009  . COLONOSCOPY  2009, 2006 , 2017  . stapes surgery     Dr Thornell Mule  . TONSILLECTOMY AND ADENOIDECTOMY     as a child    Allergies  Allergen Reactions  . Penicillins Anaphylaxis  . Bisphosphonates Other (See Comments)    [name]  . Influenza Vaccines     Pt reported  .  Simvastatin     Unknown   . Trazodone And Nefazodone     Felt her throat was closing up/kept her awake  . Sulfonamide Derivatives Rash and Other (See Comments)    blisters    Outpatient Encounter Medications as of 12/06/2017  Medication Sig  . aspirin 81 MG tablet Take 81 mg by mouth daily.  . calcium carbonate (TUMS EX) 750 MG chewable tablet Chew 750 mg by mouth daily.   . Calcium Carbonate-Vit D-Min (CALCIUM 600+D PLUS MINERALS) 600-400 MG-UNIT TABS Take 1 tablet by mouth daily.   . cholecalciferol (VITAMIN D) 1000 UNITS tablet Take 1,000 Units by mouth daily.  . cyclobenzaprine (FLEXERIL) 5 MG tablet Take 2.5 mg by mouth at bedtime.  . hydrocortisone cream 1 % Apply 1  application topically 2 (two) times daily as needed. Apply to wrists  . lisinopril (PRINIVIL,ZESTRIL) 2.5 MG tablet Take 2.5 mg by mouth daily.  . naproxen sodium (ALEVE) 220 MG tablet Take 220 mg by mouth at bedtime.  Marland Kitchen omeprazole (PRILOSEC) 20 MG capsule Take 20 mg by mouth 2 (two) times daily.   . polyethylene glycol (MIRALAX / GLYCOLAX) packet Take 17 g by mouth daily. On Sun and Wed.   No facility-administered encounter medications on file as of 12/06/2017.    ROS was provided with assistance of staff Review of Systems  Constitutional: Negative for activity change, appetite change, chills, diaphoresis, fatigue and fever.  HENT: Positive for hearing loss. Negative for congestion and voice change.   Respiratory: Negative for cough, shortness of breath and wheezing.   Cardiovascular: Positive for leg swelling. Negative for chest pain and palpitations.  Gastrointestinal: Negative for abdominal pain, constipation, diarrhea, nausea and vomiting.  Genitourinary: Negative for difficulty urinating, dysuria and urgency.  Musculoskeletal: Positive for arthralgias, gait problem and myalgias. Negative for joint swelling.  Skin: Negative for color change and pallor.  Neurological: Negative for dizziness, speech difficulty, weakness and headaches.       Memory lapses.   Psychiatric/Behavioral: Positive for hallucinations and sleep disturbance. Negative for agitation and behavioral problems. The patient is nervous/anxious.     Immunization History  Administered Date(s) Administered  . Pneumococcal Conjugate-13 09/01/2015  . Pneumococcal Polysaccharide-23 07/07/1998  . Td 08/21/2009   Pertinent  Health Maintenance Due  Topic Date Due  . PNA vac Low Risk Adult (2 of 2 - PPSV23) 08/31/2016  . DEXA SCAN  Completed  . INFLUENZA VACCINE  Discontinued   Fall Risk  11/08/2017 11/05/2016 09/01/2015 08/31/2012  Falls in the past year? No Yes Yes No  Number falls in past yr: - 2 or more 1 -  Injury with  Fall? - Yes No -  Risk for fall due to : - - Impaired balance/gait -   Functional Status Survey:    Vitals:   12/06/17 1030  BP: 122/62  Pulse: 81  Resp: 18  Temp: (!) 97.3 F (36.3 C)  SpO2: 94%  Weight: 142 lb 6.4 oz (64.6 kg)  Height: 5\' 2"  (1.575 m)   Body mass index is 26.05 kg/m. Physical Exam  Constitutional: She appears well-developed and well-nourished.  HENT:  Head: Normocephalic and atraumatic.  Eyes: Pupils are equal, round, and reactive to light. EOM are normal.  Neck: Normal range of motion. Neck supple. No JVD present. No thyromegaly present.  Cardiovascular: Normal rate and regular rhythm.  No murmur heard. Pulmonary/Chest: Effort normal. She has no wheezes. She has no rales.  Abdominal: Soft. She exhibits no distension. There is no  tenderness. There is no rebound and no guarding.  Musculoskeletal: She exhibits edema.  Trace edema BLE. Ambulates with walker. C/o muscle aches and pains all over.   Neurological: She is alert. No cranial nerve deficit. She exhibits normal muscle tone. Coordination normal.  Oriented to person and place.   Skin: Skin is warm and dry.  Psychiatric: She has a normal mood and affect.    Labs reviewed: Recent Labs    07/26/17 09/13/17 11/08/17 11/22/17  NA 142 143  --  142  K 4.2 4.1 4.3 4.3  CL 109 108 106 107  CO2 27 27 28 28   BUN 16 18 17 13   CREATININE 0.8 0.8 0.8 0.8  CALCIUM 9.0 9.9 9.1 9.4   Recent Labs    07/26/17 09/13/17 11/08/17  AST 16 18 18   ALT 10 12 11   ALKPHOS 101 113 94  PROT  --  6.5 5.2  ALBUMIN 3.8 4.3 3.7   Recent Labs    07/26/17 09/13/17 11/08/17  WBC 5.3 7.9 4.5  HGB 11.9* 13.1 11.4*  HCT 35* 39 33*  PLT 222 260 234   Lab Results  Component Value Date   TSH 2.35 09/13/2017   Lab Results  Component Value Date   HGBA1C 5.8 02/17/2017   Lab Results  Component Value Date   CHOL 197 02/17/2017   HDL 59 02/17/2017   LDLCALC 111 02/17/2017   LDLDIRECT 114.7 08/18/2011   TRIG 153  02/17/2017   CHOLHDL 2 01/23/2015    Significant Diagnostic Results in last 30 days:  No results found.  Assessment/Plan: Fibromyalgia Failed off Lyrica, will resume Lyrica 25mg  po qd x 2 weeks, then increase to 50mg  qd, continue Flexeril 2.5mg  qd, Naproxen 220mg  qd. Observe.   Hallucination of body sensation Persists, livable, the patient is managed with original Listerine.   Anxiety state Better control of pain/aches may help her night sleep. Observe.   Hypertension Blood pressure is in control, continue Lisinopril 2.5mg  po daily.      Family/ staff Communication: plan of care reviewed with the patient and charge nurse.   Labs/tests ordered:  none  Time spend 25 minutes.

## 2017-12-06 NOTE — Assessment & Plan Note (Signed)
Failed off Lyrica, will resume Lyrica 25mg  po qd x 2 weeks, then increase to 50mg  qd, continue Flexeril 2.5mg  qd, Naproxen 220mg  qd. Observe.

## 2017-12-07 ENCOUNTER — Encounter: Payer: Self-pay | Admitting: Nurse Practitioner

## 2017-12-29 ENCOUNTER — Encounter: Payer: Self-pay | Admitting: *Deleted

## 2018-02-03 ENCOUNTER — Non-Acute Institutional Stay: Payer: Medicare Other | Admitting: Nurse Practitioner

## 2018-02-03 ENCOUNTER — Encounter: Payer: Self-pay | Admitting: Nurse Practitioner

## 2018-02-03 DIAGNOSIS — K219 Gastro-esophageal reflux disease without esophagitis: Secondary | ICD-10-CM | POA: Diagnosis not present

## 2018-02-03 DIAGNOSIS — M797 Fibromyalgia: Secondary | ICD-10-CM

## 2018-02-03 DIAGNOSIS — F22 Delusional disorders: Secondary | ICD-10-CM

## 2018-02-03 DIAGNOSIS — I10 Essential (primary) hypertension: Secondary | ICD-10-CM | POA: Diagnosis not present

## 2018-02-03 NOTE — Assessment & Plan Note (Signed)
Stable, continue Lyrica 50mg  qd.

## 2018-02-03 NOTE — Assessment & Plan Note (Signed)
Resume Risperdal 0.25 mg qhs. Observe.

## 2018-02-03 NOTE — Assessment & Plan Note (Signed)
Blood pressure is controlled, continue Lisinopril 2.5mg  qd.

## 2018-02-03 NOTE — Progress Notes (Signed)
Location:  [facility] Room Number: 408 Place of Service:  ALF (13) Provider:  Marlana Latus  NP  Steele Stracener X, NP  Patient Care Team: Willett Lefeber X, NP as PCP - General (Internal Medicine) Zaidee Rion X, NP as Nurse Practitioner (Internal Medicine)  Extended Emergency Contact Information Primary Emergency Contact: Culbreth,Robin Address: [address] [place], [place] [address] [place] Phone: [phone] Mobile Phone: [phone] Relation: Daughter  Code Status:  Full Code Goals of care: Advanced Directive information Advanced Directives 02/03/2018  Does Patient Have a Medical Advance Directive? Yes  Type of Advance Directive Living will  Does patient want to make changes to medical advance directive? No - Patient declined  Copy of [place] in Chart? -  Would patient like information on creating a medical advance directive? -  Pre-existing out of facility DNR order (yellow form or pink MOST form) -     Chief Complaint  Patient presents with  . Medical Management of Chronic Issues    HPI:  Pt is a 84 y.o. female seen today for medical management of chronic diseases.     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 She reported sensations of crawling and irritation are very real to her. She applies salicylic acid solution to her skin. Risperdal was effective in the past. Hx of chronic back pain/fibromyalgia , managed with Lyrica 50mg  qd. GERD stable on Omeprazole 20mg  qd. HTN blood pressure is controlled on Lisinopril 2.5mg  qd. She resides in [facility] for safety and care assistance.    Past Medical History:  Diagnosis Date  . Acute blood loss anemia 08/23/2013   04/13/16 TSH 1.20, Na 141, K 4.2, Bun 15, creat 0.79, wbc 5.5, Hgb 11.5, plt 283  . Anxiety   . Anxiety state 07/02/2007   Qualifier: Diagnosis of  By: Lenna Gilford MD, Deborra Medina   .  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 Cigarette smoker   . Colon polyps 2009   TWO TUBULAR ADENOMAS AND HYPERPLASTIC POLYPS  . Constipation 09/14/2013  . COPD (chronic obstructive pulmonary disease) ([facility])   . Diverticulosis of colon   . DJD (degenerative joint disease)   . Dog bite of limb 07/14/2010   Dog bite 07/10/10 - dogs shots utd Last td was 08/2009  Localized tx only.    . Fibromyalgia   . GERD 12/19/2006   Qualifier: Diagnosis of  By: Julien Girt CMA, Marliss Czar  04/13/16 TSH 1.20, Na 141, K 4.2, Bun 15, creat 0.79, wbc 5.5, Hgb 11.5, plt 283   . GERD (gastroesophageal reflux disease)   . Hammer toe of second toe of left foot 04/08/2016   Left 2nd  . Hearing loss   . Hemorrhoids   . Hypercholesterolemia   . Osteoarthritis 12/19/2006   Qualifier: Diagnosis of  By: Julien Girt CMA, Marliss Czar    . Osteoporosis    Past Surgical History:  Procedure Laterality Date  . CATARACT EXTRACTION, BILATERAL  2009  . COLONOSCOPY  2009, 2006 , 2017  . stapes surgery     Dr Thornell Mule  . TONSILLECTOMY AND ADENOIDECTOMY     as a child    Allergies  Allergen Reactions  . Penicillins Anaphylaxis  . Bisphosphonates Other (See Comments)    [name]  . Influenza Vaccines     Pt reported  . Simvastatin  Unknown   . Trazodone And Nefazodone     Felt her throat was closing up/kept her awake  . Sulfonamide Derivatives Rash and Other (See Comments)    blisters    Outpatient Encounter Medications as of 02/03/2018  Medication Sig  . aspirin 81 MG tablet Take 81 mg by mouth daily.  . calcium carbonate (TUMS EX) 750 MG chewable tablet Chew 750 mg by mouth daily.   . Calcium Carbonate-Vit D-Min (CALCIUM 600+D PLUS MINERALS) 600-400 MG-UNIT TABS Take 1 tablet by mouth daily.   . cholecalciferol (VITAMIN D) 1000 UNITS tablet Take 1,000 Units by mouth daily.  . cyclobenzaprine (FLEXERIL) 5 MG tablet Take 2.5 mg by mouth at bedtime.  . hydrocortisone cream 1 % Apply 1 application topically 2 (two)  times daily as needed. Apply to wrists  . lisinopril (PRINIVIL,ZESTRIL) 2.5 MG tablet Take 2.5 mg by mouth daily.  . naproxen sodium (ALEVE) 220 MG tablet Take 220 mg by mouth daily as needed. Take 1 tablet at dinner  . omeprazole (PRILOSEC) 20 MG capsule Take 20 mg by mouth 2 (two) times daily.   . polyethylene glycol (MIRALAX / GLYCOLAX) packet Take 17 g by mouth daily as needed. On Sun and Wed  . pregabalin (LYRICA) 50 MG capsule Take 50 mg by mouth daily.   No facility-administered encounter medications on file as of 02/03/2018.    ROS was provided with assistance of staff Review of Systems  Constitutional: Negative for activity change, appetite change, chills, diaphoresis, fatigue, fever and unexpected weight change.  HENT: Positive for hearing loss. Negative for congestion and voice change.   Respiratory: Negative for cough, shortness of breath and wheezing.   Cardiovascular: Positive for leg swelling. Negative for chest pain and palpitations.  Gastrointestinal: Negative for abdominal distention, abdominal pain, constipation, diarrhea, nausea and vomiting.  Genitourinary: Negative for difficulty urinating, dysuria and urgency.  Musculoskeletal: Positive for arthralgias and gait problem.  Skin: Negative for color change, pallor, rash and wound.  Neurological: Negative for dizziness, speech difficulty, weakness and headaches.       Memory lapses.   Psychiatric/Behavioral: Positive for hallucinations and sleep disturbance. Negative for agitation and behavioral problems. The patient is not nervous/anxious.     Immunization History  Administered Date(s) Administered  . Pneumococcal Conjugate-13 09/01/2015  . Pneumococcal Polysaccharide-23 07/07/1998  . Td 08/21/2009   Pertinent  Health Maintenance Due  Topic Date Due  . PNA vac Low Risk Adult (2 of 2 - PPSV23) 08/31/2016  . DEXA SCAN  Completed  . INFLUENZA VACCINE  Discontinued   Fall Risk  11/08/2017 11/05/2016 09/01/2015  08/31/2012  Falls in the past year? No Yes Yes No  Number falls in past yr: - 2 or more 1 -  Injury with Fall? - Yes No -  Risk for fall due to : - - Impaired balance/gait -   Functional Status Survey:    Vitals:   02/03/18 1109  BP: 120/64  Pulse: 62  Resp: 18  Temp: 97.8 F (36.6 C)  SpO2: 94%  Weight: 148 lb (67.1 kg)  Height: 5\' 2"  (1.575 m)   Body mass index is 27.07 kg/m. Physical Exam Constitutional:      General: She is not in acute distress.    Appearance: Normal appearance. She is not ill-appearing or diaphoretic.     Comments: Over weight  HENT:     Head: Normocephalic and atraumatic.     Nose: Nose normal.     Mouth/Throat:  Mouth: Mucous membranes are moist.  Eyes:     Extraocular Movements: Extraocular movements intact.     Pupils: Pupils are equal, round, and reactive to light.  Neck:     Musculoskeletal: Normal range of motion and neck supple. No muscular tenderness.  Cardiovascular:     Rate and Rhythm: Normal rate and regular rhythm.     Heart sounds: No murmur.  Pulmonary:     Effort: Pulmonary effort is normal.     Breath sounds: Normal breath sounds. No wheezing, rhonchi or rales.  Abdominal:     General: There is no distension.     Palpations: Abdomen is soft.     Tenderness: There is no guarding or rebound.  Musculoskeletal:     Right lower leg: Edema present.     Left lower leg: Edema present.     Comments: Trace edema BLE  Skin:    General: Skin is warm and dry.     Findings: No lesion or rash.  Neurological:     General: No focal deficit present.     Mental Status: She is alert. Mental status is at baseline.     Cranial Nerves: No cranial nerve deficit.     Sensory: No sensory deficit.     Coordination: Coordination normal.     Gait: Gait abnormal.     Comments: Oriented to person and place.   Psychiatric:        Mood and Affect: Mood normal.        Behavior: Behavior normal.     Comments: False belief of bug infested skin        Labs reviewed: Recent Labs    07/26/17 09/13/17 11/08/17 11/22/17  NA 142 143  --  142  K 4.2 4.1 4.3 4.3  CL 109 108 106 107  CO2 27 27 28 28   BUN 16 18 17 13   CREATININE 0.8 0.8 0.8 0.8  CALCIUM 9.0 9.9 9.1 9.4   Recent Labs    07/26/17 09/13/17 11/08/17  AST 16 18 18   ALT 10 12 11   ALKPHOS 101 113 94  PROT  --  6.5 5.2  ALBUMIN 3.8 4.3 3.7   Recent Labs    07/26/17 09/13/17 11/08/17  WBC 5.3 7.9 4.5  HGB 11.9* 13.1 11.4*  HCT 35* 39 33*  PLT 222 260 234   Lab Results  Component Value Date   TSH 2.35 09/13/2017   Lab Results  Component Value Date   HGBA1C 5.8 02/17/2017   Lab Results  Component Value Date   CHOL 197 02/17/2017   HDL 59 02/17/2017   LDLCALC 111 02/17/2017   LDLDIRECT 114.7 08/18/2011   TRIG 153 02/17/2017   CHOLHDL 2 01/23/2015    Significant Diagnostic Results in last 30 days:  No results found.  Assessment/Plan Delusions of parasitosis (HCC) Resume Risperdal 0.25 mg qhs. Observe.   Fibromyalgia Stable, continue Lyrica 50mg  qd.   Hypertension Blood pressure is controlled, continue Lisinopril 2.5mg  qd.   GERD Stable, continue Omeprazole 20mg  qd.      Family/ staff Communication: plan of care reviewed with the patient and charge nurse.   Labs/tests ordered:  None   Time spend 25 minutes.

## 2018-02-03 NOTE — Assessment & Plan Note (Signed)
Stable, continue Omeprazole 20mg qd.  

## 2018-02-09 LAB — CBC AND DIFFERENTIAL: HCT: 34 — AB (ref 36–46)

## 2018-02-10 ENCOUNTER — Encounter: Payer: Self-pay | Admitting: Nurse Practitioner

## 2018-02-10 ENCOUNTER — Non-Acute Institutional Stay: Payer: Medicare Other | Admitting: Nurse Practitioner

## 2018-02-10 DIAGNOSIS — F22 Delusional disorders: Secondary | ICD-10-CM | POA: Diagnosis not present

## 2018-02-10 DIAGNOSIS — J069 Acute upper respiratory infection, unspecified: Secondary | ICD-10-CM | POA: Insufficient documentation

## 2018-02-10 DIAGNOSIS — J042 Acute laryngotracheitis: Secondary | ICD-10-CM | POA: Diagnosis not present

## 2018-02-10 DIAGNOSIS — I10 Essential (primary) hypertension: Secondary | ICD-10-CM | POA: Diagnosis not present

## 2018-02-10 LAB — HEPATIC FUNCTION PANEL
ALT: 12 (ref 7–35)
AST: 18 (ref 13–35)
Alkaline Phosphatase: 109 (ref 25–125)
Bilirubin, Total: 0.4

## 2018-02-10 LAB — CBC AND DIFFERENTIAL
Hemoglobin: 11.5 — AB (ref 12.0–16.0)
PLATELETS: 239 (ref 150–399)
WBC: 6.9

## 2018-02-10 LAB — BASIC METABOLIC PANEL
BUN: 16 (ref 4–21)
CREATININE: 0.8 (ref <=1.1)
Glucose: 103
Potassium: 4.8 (ref 3.4–5.3)
Sodium: 143 (ref 137–147)

## 2018-02-10 NOTE — Assessment & Plan Note (Signed)
Will obtain CXR ap/lateral views to r/o PNA. CBC/diff, CMP to evaluate further. Starts Azithromycin 500mg  po day 1, then 250mg  qd for day 2-5. Mucinex 600mg  bid x 72 hours. Observe.

## 2018-02-10 NOTE — Assessment & Plan Note (Signed)
Dc Risperdal-patient didn't like how the medicine makes her felt.

## 2018-02-10 NOTE — Progress Notes (Signed)
Location:  [facility] Room Number: 440 Place of Service:  ALF (13) Provider:  Marlana Latus  NP  Wessley Emert X, NP  Patient Care Team: Anelis Hrivnak X, NP as PCP - General (Internal Medicine) Cyrus Ramsburg X, NP as Nurse Practitioner (Internal Medicine)  Extended Emergency Contact Information Primary Emergency Contact: Culbreth,Robin Address: [address] [place], [place] [address] [place] Phone: [phone] Mobile Phone: [phone] Relation: Daughter  Code Status:  Full Code Goals of care: Advanced Directive information Advanced Directives 02/03/2018  Does Patient Have a Medical Advance Directive? Yes  Type of Advance Directive Living will  Does patient want to make changes to medical advance directive? No - Patient declined  Copy of [place] in Chart? -  Would patient like information on creating a medical advance directive? -  Pre-existing out of facility DNR order (yellow form or pink MOST form) -     Chief Complaint  Patient presents with  . Acute Visit    C/o- lost of voice     HPI:  Pt is a 84 y.o. female seen today for an acute visit for cough, raspy voice, greenish phlegm x 2-3 days. She is afebrile, no O2 desaturation. She denied sore throat, chest pain/pressure or palpitation. No noted focal weakness. The patient also has been refusing Risperdal started 02/03/18 for delusions of parasitosis.    Past Medical History:  Diagnosis Date  . Acute blood loss anemia 08/23/2013   04/13/16 TSH 1.20, Na 141, K 4.2, Bun 15, creat 0.79, wbc 5.5, Hgb 11.5, plt 283  . Anxiety   . Anxiety state 07/02/2007   Qualifier: Diagnosis of  By: Lenna Gilford MD, Deborra Medina   . 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 Cigarette smoker   . Colon polyps 2009   TWO TUBULAR ADENOMAS AND HYPERPLASTIC POLYPS  . Constipation 09/14/2013  . COPD (chronic obstructive pulmonary disease) ([facility])   . Diverticulosis of colon     . DJD (degenerative joint disease)   . Dog bite of limb 07/14/2010   Dog bite 07/10/10 - dogs shots utd Last td was 08/2009  Localized tx only.    . Fibromyalgia   . GERD 12/19/2006   Qualifier: Diagnosis of  By: Julien Girt CMA, Marliss Czar  04/13/16 TSH 1.20, Na 141, K 4.2, Bun 15, creat 0.79, wbc 5.5, Hgb 11.5, plt 283   . GERD (gastroesophageal reflux disease)   . Hammer toe of second toe of left foot 04/08/2016   Left 2nd  . Hearing loss   . Hemorrhoids   . Hypercholesterolemia   . Osteoarthritis 12/19/2006   Qualifier: Diagnosis of  By: Julien Girt CMA, Marliss Czar    . Osteoporosis    Past Surgical History:  Procedure Laterality Date  . CATARACT EXTRACTION, BILATERAL  2009  . COLONOSCOPY  2009, 2006 , 2017  . stapes surgery     Dr Thornell Mule  . TONSILLECTOMY AND ADENOIDECTOMY     as a child    Allergies  Allergen Reactions  . Penicillins Anaphylaxis  . Bisphosphonates Other (See Comments)    [name]  . Influenza Vaccines     Pt reported  . Simvastatin     Unknown   . Trazodone And Nefazodone     Felt her throat was closing up/kept her awake  . Sulfonamide Derivatives Rash and Other (See Comments)    blisters    Outpatient Encounter Medications  as of 02/10/2018  Medication Sig  . calcium carbonate (TUMS EX) 750 MG chewable tablet Chew 750 mg by mouth daily.   . Calcium Carbonate-Vit D-Min (CALCIUM 600+D PLUS MINERALS) 600-400 MG-UNIT TABS Take 1 tablet by mouth daily.   . cholecalciferol (VITAMIN D) 1000 UNITS tablet Take 1,000 Units by mouth daily.  . cyclobenzaprine (FLEXERIL) 5 MG tablet Take 2.5 mg by mouth at bedtime.  . hydrocortisone cream 1 % Apply 1 application topically 2 (two) times daily as needed. Apply to wrists  . lisinopril (PRINIVIL,ZESTRIL) 2.5 MG tablet Take 2.5 mg by mouth daily.  . naproxen sodium (ALEVE) 220 MG tablet Take 220 mg by mouth daily as needed. Take 1 tablet at dinner  . omeprazole (PRILOSEC) 20 MG capsule Take 20 mg by mouth 2 (two) times daily.   .  [EXPIRED] Phenylephrine-DM-GG (ROBAFEN CF COUGH/COLD) 5-10-100 MG/5ML SYRP Take 10 mLs by mouth every 6 (six) hours as needed.  . polyethylene glycol (MIRALAX / GLYCOLAX) packet Take 17 g by mouth daily as needed. On Sun and Wed  . pregabalin (LYRICA) 50 MG capsule Take 50 mg by mouth daily.  . [DISCONTINUED] aspirin 81 MG tablet Take 81 mg by mouth daily.   No facility-administered encounter medications on file as of 02/10/2018.     Review of Systems  Constitutional: Positive for activity change, appetite change and fatigue. Negative for chills, diaphoresis and fever.  HENT: Positive for hearing loss and voice change. Negative for congestion, ear pain, mouth sores, postnasal drip, rhinorrhea, sinus pressure, sinus pain, sneezing, sore throat and trouble swallowing.   Eyes: Negative for visual disturbance.  Respiratory: Positive for cough. Negative for shortness of breath and wheezing.   Cardiovascular: Negative for chest pain, palpitations and leg swelling.  Gastrointestinal: Negative for abdominal distention, abdominal pain, constipation, diarrhea, nausea and vomiting.  Genitourinary: Negative for difficulty urinating, dysuria, frequency and urgency.  Musculoskeletal: Positive for arthralgias and gait problem. Negative for joint swelling.  Skin: Negative for color change and pallor.  Neurological: Negative for dizziness, tremors, speech difficulty, weakness, numbness and headaches.  Psychiatric/Behavioral: Positive for hallucinations. Negative for agitation, behavioral problems and sleep disturbance. The patient is not nervous/anxious.     Immunization History  Administered Date(s) Administered  . Pneumococcal Conjugate-13 09/01/2015  . Pneumococcal Polysaccharide-23 07/07/1998  . Td 08/21/2009   Pertinent  Health Maintenance Due  Topic Date Due  . PNA vac Low Risk Adult (2 of 2 - PPSV23) 08/31/2016  . DEXA SCAN  Completed  . INFLUENZA VACCINE  Discontinued   Fall Risk  11/08/2017  11/05/2016 09/01/2015 08/31/2012  Falls in the past year? No Yes Yes No  Number falls in past yr: - 2 or more 1 -  Injury with Fall? - Yes No -  Risk for fall due to : - - Impaired balance/gait -   Functional Status Survey:    Vitals:   02/10/18 1033  BP: 124/60  Pulse: 76  Resp: 18  Temp: 97.7 F (36.5 C)  SpO2: 97%  Weight: 148 lb (67.1 kg)  Height: 5\' 2"  (1.575 m)   Body mass index is 27.07 kg/m. Physical Exam Constitutional:      General: She is not in acute distress.    Appearance: Normal appearance. She is not ill-appearing, toxic-appearing or diaphoretic.  HENT:     Head: Normocephalic and atraumatic.     Nose: Nose normal. No congestion or rhinorrhea.     Comments: Dry mouth    Mouth/Throat:  Pharynx: No oropharyngeal exudate or posterior oropharyngeal erythema.  Eyes:     Extraocular Movements: Extraocular movements intact.     Conjunctiva/sclera: Conjunctivae normal.     Pupils: Pupils are equal, round, and reactive to light.  Neck:     Musculoskeletal: Normal range of motion and neck supple.  Cardiovascular:     Rate and Rhythm: Normal rate and regular rhythm.     Heart sounds: No murmur.  Pulmonary:     Effort: Pulmonary effort is normal.     Breath sounds: Normal breath sounds. No wheezing, rhonchi or rales.     Comments: Central congestion.  Abdominal:     General: There is no distension.     Palpations: Abdomen is soft.     Tenderness: There is no abdominal tenderness. There is no guarding or rebound.  Musculoskeletal:     Right lower leg: No edema.     Left lower leg: No edema.     Comments: Examined in bed.   Skin:    General: Skin is warm.  Neurological:     General: No focal deficit present.     Mental Status: She is alert. Mental status is at baseline.     Comments: Oriented to person and place.   Psychiatric:        Mood and Affect: Mood normal.        Behavior: Behavior normal.     Labs reviewed: Recent Labs    09/13/17 11/08/17  11/22/17 02/10/18  NA 143  --  142 143  K 4.1 4.3 4.3 4.8  CL 108 106 107  --   CO2 27 28 28   --   BUN 18 17 13 16   CREATININE 0.8 0.8 0.8 0.8  CALCIUM 9.9 9.1 9.4  --    Recent Labs    07/26/17 09/13/17 11/08/17 02/10/18  AST 16 18 18 18   ALT 10 12 11 12   ALKPHOS 101 113 94 109  PROT  --  6.5 5.2  --   ALBUMIN 3.8 4.3 3.7  --    Recent Labs    09/13/17 11/08/17 02/09/18 02/10/18  WBC 7.9 4.5  --  6.9  HGB 13.1 11.4*  --  11.5*  HCT 39 33* 34*  --   PLT 260 234  --  239   Lab Results  Component Value Date   TSH 2.35 09/13/2017   Lab Results  Component Value Date   HGBA1C 5.8 02/17/2017   Lab Results  Component Value Date   CHOL 197 02/17/2017   HDL 59 02/17/2017   LDLCALC 111 02/17/2017   LDLDIRECT 114.7 08/18/2011   TRIG 153 02/17/2017   CHOLHDL 2 01/23/2015    Significant Diagnostic Results in last 30 days:  No results found.  Assessment/Plan Upper respiratory infection Will obtain CXR ap/lateral views to r/o PNA. CBC/diff, CMP to evaluate further. Starts Azithromycin 500mg  po day 1, then 250mg  qd for day 2-5. Mucinex 600mg  bid x 72 hours. Observe.   Delusions of parasitosis ([facility]) Dc Risperdal-patient didn't like how the medicine makes her felt.      Family/ staff Communication: plan of care reviewed with the patient and charge nurse.   Labs/tests ordered:  CBC/diff, CMP, CXR ap/lateral views  Time spend 25 minutes.

## 2018-02-11 DIAGNOSIS — R05 Cough: Secondary | ICD-10-CM | POA: Diagnosis not present

## 2018-02-13 ENCOUNTER — Encounter: Payer: Self-pay | Admitting: Nurse Practitioner

## 2018-02-13 ENCOUNTER — Other Ambulatory Visit: Payer: Self-pay | Admitting: *Deleted

## 2018-02-24 DIAGNOSIS — R509 Fever, unspecified: Secondary | ICD-10-CM | POA: Diagnosis not present

## 2018-02-24 DIAGNOSIS — N390 Urinary tract infection, site not specified: Secondary | ICD-10-CM | POA: Diagnosis not present

## 2018-02-24 DIAGNOSIS — R300 Dysuria: Secondary | ICD-10-CM | POA: Diagnosis not present

## 2018-02-24 DIAGNOSIS — D649 Anemia, unspecified: Secondary | ICD-10-CM | POA: Diagnosis not present

## 2018-02-24 DIAGNOSIS — I10 Essential (primary) hypertension: Secondary | ICD-10-CM | POA: Diagnosis not present

## 2018-02-24 LAB — CBC AND DIFFERENTIAL
HEMATOCRIT: 35 — AB (ref 36–46)
HEMOGLOBIN: 11.7 — AB (ref 12.0–16.0)
PLATELETS: 254 (ref 150–399)
WBC: 9

## 2018-02-27 ENCOUNTER — Non-Acute Institutional Stay: Payer: Medicare Other | Admitting: Nurse Practitioner

## 2018-02-27 ENCOUNTER — Encounter: Payer: Self-pay | Admitting: Nurse Practitioner

## 2018-02-27 DIAGNOSIS — M150 Primary generalized (osteo)arthritis: Secondary | ICD-10-CM

## 2018-02-27 DIAGNOSIS — N390 Urinary tract infection, site not specified: Secondary | ICD-10-CM | POA: Diagnosis not present

## 2018-02-27 DIAGNOSIS — M159 Polyosteoarthritis, unspecified: Secondary | ICD-10-CM

## 2018-02-27 NOTE — Assessment & Plan Note (Addendum)
urine culture 02/24/18 showed E coli >100,000c/ml, susceptible to Nitrofurantoin, start Nitrofurantoin 100mg  bid x 7 days, [facility]ora[facility] bid x 7 days.

## 2018-02-27 NOTE — Progress Notes (Signed)
Location:  [facility] Room Number: 157 Place of Service:  ALF (13) Provider: Marlana Latus  NP  Rheanna Sergent X, NP  Patient Care Team: Sherrell Farish X, NP as PCP - General (Internal Medicine) Alexianna Nachreiner X, NP as Nurse Practitioner (Internal Medicine)  Extended Emergency Contact Information Primary Emergency Contact: Culbreth,Robin Address: [address] [place], [place] [address] [place] Phone: [phone] Mobile Phone: [phone] Relation: Daughter  Code Status:  Full Code Goals of care: Advanced Directive information Advanced Directives 02/27/2018  Does Patient Have a Medical Advance Directive? Yes  Type of Advance Directive Living will  Does patient want to make changes to medical advance directive? No - Patient declined  Copy of [place] in Chart? -  Would patient like information on creating a medical advance directive? -  Pre-existing out of facility DNR order (yellow form or pink MOST form) -     Chief Complaint  Patient presents with  . Acute Visit    UTI    HPI:  Pt is a 85 y.o. female seen today for an acute visit for UTI, urine culture for symptoms of dysuria, lower back pain, urinary frequency done 02/24/18 showed E coli >100,000c/ml, susceptible to Nitrofurantoin. 02/24/18 wbc 9.0, Hgb 11.7, neutrophils 75.4%. chronic OA/upper back pain is managed on Lyrica 50mg  qd, prn Aleve 220mg  daily, Flexeril 2.5mg  qd.    Past Medical History:  Diagnosis Date  . Acute blood loss anemia 08/23/2013   04/13/16 TSH 1.20, Na 141, K 4.2, Bun 15, creat 0.79, wbc 5.5, Hgb 11.5, plt 283  . Anxiety   . Anxiety state 07/02/2007   Qualifier: Diagnosis of  By: Lenna Gilford MD, Deborra Medina   . 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 Cigarette smoker   . Colon polyps 2009   TWO TUBULAR ADENOMAS AND HYPERPLASTIC POLYPS  . Constipation 09/14/2013  . COPD (chronic obstructive pulmonary disease) ([place])   .  Diverticulosis of colon   . DJD (degenerative joint disease)   . Dog bite of limb 07/14/2010   Dog bite 07/10/10 - dogs shots utd Last td was 08/2009  Localized tx only.    . Fibromyalgia   . GERD 12/19/2006   Qualifier: Diagnosis of  By: Julien Girt CMA, Marliss Czar  04/13/16 TSH 1.20, Na 141, K 4.2, Bun 15, creat 0.79, wbc 5.5, Hgb 11.5, plt 283   . GERD (gastroesophageal reflux disease)   . Hammer toe of second toe of left foot 04/08/2016   Left 2nd  . Hearing loss   . Hemorrhoids   . Hypercholesterolemia   . Osteoarthritis 12/19/2006   Qualifier: Diagnosis of  By: Julien Girt CMA, Marliss Czar    . Osteoporosis    Past Surgical History:  Procedure Laterality Date  . CATARACT EXTRACTION, BILATERAL  2009  . COLONOSCOPY  2009, 2006 , 2017  . stapes surgery     Dr Thornell Mule  . TONSILLECTOMY AND ADENOIDECTOMY     as a child    Allergies  Allergen Reactions  . Penicillins Anaphylaxis  . Bisphosphonates Other (See Comments)    [name]  . Influenza Vaccines     Pt reported  . Simvastatin     Unknown   . Trazodone And Nefazodone     Felt her throat was closing up/kept her awake  . Sulfonamide Derivatives Rash and Other (See Comments)    blisters    Outpatient Encounter Medications  as of 02/27/2018  Medication Sig  . calcium carbonate (TUMS EX) 750 MG chewable tablet Chew 750 mg by mouth daily.   . Calcium Carbonate-Vit D-Min (CALCIUM 600+D PLUS MINERALS) 600-400 MG-UNIT TABS Take 1 tablet by mouth daily.   . cholecalciferol (VITAMIN D) 1000 UNITS tablet Take 1,000 Units by mouth daily.  . cyclobenzaprine (FLEXERIL) 5 MG tablet Take 2.5 mg by mouth at bedtime.  . hydrocortisone cream 1 % Apply 1 application topically 2 (two) times daily as needed. Apply to wrists  . lisinopril (PRINIVIL,ZESTRIL) 2.5 MG tablet Take 2.5 mg by mouth daily.  . naproxen sodium (ALEVE) 220 MG tablet Take 220 mg by mouth daily as needed. Take 1 tablet at dinner  . omeprazole (PRILOSEC) 20 MG capsule Take 20 mg by mouth 2  (two) times daily.   . polyethylene glycol (MIRALAX / GLYCOLAX) packet Take 17 g by mouth daily as needed. On Sun and Wed  . pregabalin (LYRICA) 50 MG capsule Take 50 mg by mouth daily.   No facility-administered encounter medications on file as of 02/27/2018.     Review of Systems  Constitutional: Negative for activity change, appetite change, chills, diaphoresis, fatigue and fever.  HENT: Positive for hearing loss. Negative for congestion.   Respiratory: Negative for cough, shortness of breath and wheezing.   Cardiovascular: Positive for leg swelling. Negative for chest pain and palpitations.  Gastrointestinal: Negative for abdominal distention, abdominal pain, constipation, diarrhea, nausea and vomiting.  Genitourinary: Positive for dysuria and frequency. Negative for difficulty urinating and urgency.  Musculoskeletal: Positive for arthralgias, back pain and gait problem.       Upper back/neck aches/pain/muscle spasm. New lower back pain. No injury  Neurological: Negative for dizziness, speech difficulty, weakness and headaches.       Memory lapses.   Psychiatric/Behavioral: Positive for sleep disturbance. Negative for agitation and behavioral problems.       Long standing difficulty falling asleep.     Immunization History  Administered Date(s) Administered  . Pneumococcal Conjugate-13 09/01/2015  . Pneumococcal Polysaccharide-23 07/07/1998  . Td 08/21/2009   Pertinent  Health Maintenance Due  Topic Date Due  . PNA vac Low Risk Adult (2 of 2 - PPSV23) 08/31/2016  . DEXA SCAN  Completed  . INFLUENZA VACCINE  Discontinued   Fall Risk  11/08/2017 11/05/2016 09/01/2015 08/31/2012  Falls in the past year? No Yes Yes No  Number falls in past yr: - 2 or more 1 -  Injury with Fall? - Yes No -  Risk for fall due to : - - Impaired balance/gait -   Functional Status Survey:    Vitals:   02/27/18 1450  BP: 120/60  Pulse: 72  Resp: 20  Temp: (!) 96.3 F (35.7 C)  SpO2: 93%    Weight: 142 lb (64.4 kg)  Height: 5\' 2"  (1.575 m)   Body mass index is 25.97 kg/m. Physical Exam Constitutional:      General: She is not in acute distress.    Appearance: Normal appearance. She is normal weight. She is not ill-appearing, toxic-appearing or diaphoretic.  HENT:     Head: Normocephalic and atraumatic.     Nose: Nose normal. No congestion or rhinorrhea.     Mouth/Throat:     Mouth: Mucous membranes are moist.  Eyes:     Extraocular Movements: Extraocular movements intact.     Pupils: Pupils are equal, round, and reactive to light.  Neck:     Musculoskeletal: Normal range of motion and  neck supple. No neck rigidity or muscular tenderness.  Cardiovascular:     Rate and Rhythm: Normal rate and regular rhythm.     Heart sounds: No murmur.  Pulmonary:     Effort: Pulmonary effort is normal.     Breath sounds: Normal breath sounds.  Abdominal:     General: There is no distension.     Palpations: Abdomen is soft.     Tenderness: There is abdominal tenderness. There is no right CVA tenderness, left CVA tenderness, guarding or rebound.     Comments: Supra pubic discomfort on palpation.   Musculoskeletal:     Right lower leg: Edema present.     Left lower leg: Edema present.     Comments: Ambulates with walker. Lower back pain is associated with onset of dysuria. Chronic upper back/neck aches/muscle spasm. Chronic trace edema BLE  Skin:    General: Skin is warm and dry.  Neurological:     General: No focal deficit present.     Mental Status: She is alert. Mental status is at baseline.     Cranial Nerves: No cranial nerve deficit.     Motor: No weakness.     Coordination: Coordination normal.     Gait: Gait abnormal.     Comments: Oriented to person and place.   Psychiatric:        Mood and Affect: Mood normal.        Behavior: Behavior normal.     Labs reviewed: Recent Labs    09/13/17 11/08/17 11/22/17 02/10/18  NA 143  --  142 143  K 4.1 4.3 4.3 4.8  CL  108 106 107  --   CO2 27 28 28   --   BUN 18 17 13 16   CREATININE 0.8 0.8 0.8 0.8  CALCIUM 9.9 9.1 9.4  --    Recent Labs    07/26/17 09/13/17 11/08/17 02/10/18  AST 16 18 18 18   ALT 10 12 11 12   ALKPHOS 101 113 94 109  PROT  --  6.5 5.2  --   ALBUMIN 3.8 4.3 3.7  --    Recent Labs    11/08/17 02/09/18 02/10/18 02/24/18  WBC 4.5  --  6.9 9.0  HGB 11.4*  --  11.5* 11.7*  HCT 33* 34*  --  35*  PLT 234  --  239 254   Lab Results  Component Value Date   TSH 2.35 09/13/2017   Lab Results  Component Value Date   HGBA1C 5.8 02/17/2017   Lab Results  Component Value Date   CHOL 197 02/17/2017   HDL 59 02/17/2017   LDLCALC 111 02/17/2017   LDLDIRECT 114.7 08/18/2011   TRIG 153 02/17/2017   CHOLHDL 2 01/23/2015    Significant Diagnostic Results in last 30 days:  No results found.  Assessment/Plan UTI (urinary tract infection) urine culture 02/24/18 showed E coli >100,000c/ml, susceptible to Nitrofurantoin, start Nitrofurantoin 100mg  bid x 7 days, FloraStor bid x 7 days.   Osteoarthritis chronic OA/upper back pain is managed, continue Lyrica 50mg  qd, prn Aleve 220mg  daily, Flexeril 2.5mg  qd.       Family/ staff Communication: plan of care reviewed with the patient and charge nurse.   Labs/tests ordered:  none  Time spend 25 minutes.

## 2018-02-27 NOTE — Assessment & Plan Note (Signed)
chronic OA/upper back pain is managed, continue Lyrica 50mg  qd, prn Aleve 220mg  daily, Flexeril 2.5mg  qd.

## 2018-03-30 ENCOUNTER — Encounter: Payer: Self-pay | Admitting: Nurse Practitioner

## 2018-03-30 ENCOUNTER — Non-Acute Institutional Stay: Payer: Medicare Other | Admitting: Nurse Practitioner

## 2018-03-30 DIAGNOSIS — N390 Urinary tract infection, site not specified: Secondary | ICD-10-CM

## 2018-03-30 DIAGNOSIS — R300 Dysuria: Secondary | ICD-10-CM | POA: Diagnosis not present

## 2018-03-30 DIAGNOSIS — F22 Delusional disorders: Secondary | ICD-10-CM | POA: Diagnosis not present

## 2018-03-30 LAB — CBC AND DIFFERENTIAL
HCT: 36 (ref 36–46)
Hemoglobin: 12.1 (ref 12.0–16.0)
PLATELETS: 237 (ref 150–399)
WBC: 5.5

## 2018-03-30 NOTE — Progress Notes (Signed)
Location:  [facility] Room Number: 892 Place of Service:  ALF (13) Provider:  Marlana Latus  NP  Willim Turnage X, NP  Patient Care Team: Rilei Kravitz X, NP as PCP - General (Internal Medicine) Aurelia Gras X, NP as Nurse Practitioner (Internal Medicine)  Extended Emergency Contact Information Primary Emergency Contact: Culbreth,Robin Address: [address] [place], [place] [address] [place] Phone: [phone] Mobile Phone: [phone] Relation: Daughter  Code Status:  Full Code Goals of care: Advanced Directive information Advanced Directives 03/30/2018  Does Patient Have a Medical Advance Directive? Yes  Type of Advance Directive Living will  Does patient want to make changes to medical advance directive? No - Patient declined  Copy of [place] in Chart? -  Would patient like information on creating a medical advance directive? -  Pre-existing out of facility DNR order (yellow form or pink MOST form) -     Chief Complaint  Patient presents with  . Acute Visit    C/o- ?UTI    HPI:  Pt is a 85 y.o. female seen today for an acute visit for dysuria, supra pubic/lower back pain/discomfort, nausea, malaise/aches, urinary urgency, urinary incontinence x 2 days. She is afebrile. HPI was provided with assistance of staff. She resides in [facility] for safety and care assistance, hx of delusion of parasitosis, declined psychotropic meds.     Past Medical History:  Diagnosis Date  . Acute blood loss anemia 08/23/2013   04/13/16 TSH 1.20, Na 141, K 4.2, Bun 15, creat 0.79, wbc 5.5, Hgb 11.5, plt 283  . Anxiety   . Anxiety state 07/02/2007   Qualifier: Diagnosis of  By: Lenna Gilford MD, Deborra Medina   . 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 Cigarette smoker   . Colon polyps 2009   TWO TUBULAR ADENOMAS AND HYPERPLASTIC POLYPS  . Constipation 09/14/2013  . COPD (chronic obstructive pulmonary disease) ([place])   .  Diverticulosis of colon   . DJD (degenerative joint disease)   . Dog bite of limb 07/14/2010   Dog bite 07/10/10 - dogs shots utd Last td was 08/2009  Localized tx only.    . Fibromyalgia   . GERD 12/19/2006   Qualifier: Diagnosis of  By: Julien Girt CMA, Marliss Czar  04/13/16 TSH 1.20, Na 141, K 4.2, Bun 15, creat 0.79, wbc 5.5, Hgb 11.5, plt 283   . GERD (gastroesophageal reflux disease)   . Hammer toe of second toe of left foot 04/08/2016   Left 2nd  . Hearing loss   . Hemorrhoids   . Hypercholesterolemia   . Osteoarthritis 12/19/2006   Qualifier: Diagnosis of  By: Julien Girt CMA, Marliss Czar    . Osteoporosis    Past Surgical History:  Procedure Laterality Date  . CATARACT EXTRACTION, BILATERAL  2009  . COLONOSCOPY  2009, 2006 , 2017  . stapes surgery     Dr Thornell Mule  . TONSILLECTOMY AND ADENOIDECTOMY     as a child    Allergies  Allergen Reactions  . Penicillins Anaphylaxis  . Bisphosphonates Other (See Comments)    [name]  . Influenza Vaccines     Pt reported  . Simvastatin     Unknown   . Trazodone And Nefazodone     Felt her throat was closing up/kept her awake  . Sulfonamide Derivatives Rash and Other (See Comments)    blisters    Outpatient Encounter Medications  as of 03/30/2018  Medication Sig  . calcium carbonate (TUMS EX) 750 MG chewable tablet Chew 750 mg by mouth daily.   . Calcium Carbonate-Vit D-Min (CALCIUM 600+D PLUS MINERALS) 600-400 MG-UNIT TABS Take 1 tablet by mouth daily.   . cholecalciferol (VITAMIN D) 1000 UNITS tablet Take 1,000 Units by mouth daily.  . cyclobenzaprine (FLEXERIL) 5 MG tablet Take 2.5 mg by mouth at bedtime.  . hydrocortisone cream 1 % Apply 1 application topically 2 (two) times daily as needed. Apply to wrists  . lisinopril (PRINIVIL,ZESTRIL) 2.5 MG tablet Take 2.5 mg by mouth daily.  . naproxen sodium (ALEVE) 220 MG tablet Take 220 mg by mouth daily as needed. Take 1 tablet at dinner  . omeprazole (PRILOSEC) 20 MG capsule Take 20 mg by mouth 2  (two) times daily.   . polyethylene glycol (MIRALAX / GLYCOLAX) packet Take 17 g by mouth daily as needed. On Sun and Wed  . pregabalin (LYRICA) 50 MG capsule Take 50 mg by mouth daily.   No facility-administered encounter medications on file as of 03/30/2018.    ROS was provided with assistance of staff.  Review of Systems  Constitutional: Positive for activity change, appetite change and fatigue. Negative for chills, diaphoresis and fever.  HENT: Positive for hearing loss. Negative for voice change.   Respiratory: Negative for cough, shortness of breath and wheezing.   Cardiovascular: Positive for leg swelling. Negative for chest pain and palpitations.  Gastrointestinal: Positive for abdominal pain and nausea. Negative for abdominal distention, constipation and vomiting.  Genitourinary: Positive for dysuria, frequency and urgency. Negative for difficulty urinating and hematuria.       Incontinent of urine.   Musculoskeletal: Positive for arthralgias, back pain and gait problem.       Aches muscle, muscle spasm, lower back pain.   Skin: Negative for color change and pallor.  Neurological: Negative for dizziness, speech difficulty, weakness and headaches.       Memory lapses.   Psychiatric/Behavioral: Negative for agitation, hallucinations and sleep disturbance. The patient is not nervous/anxious.        Delusion.     Immunization History  Administered Date(s) Administered  . Pneumococcal Conjugate-13 09/01/2015  . Pneumococcal Polysaccharide-23 07/07/1998  . Td 08/21/2009   Pertinent  Health Maintenance Due  Topic Date Due  . PNA vac Low Risk Adult (2 of 2 - PPSV23) 08/31/2016  . DEXA SCAN  Completed  . INFLUENZA VACCINE  Discontinued   Fall Risk  11/08/2017 11/05/2016 09/01/2015 08/31/2012  Falls in the past year? No Yes Yes No  Number falls in past yr: - 2 or more 1 -  Injury with Fall? - Yes No -  Risk for fall due to : - - Impaired balance/gait -   Functional Status  Survey:    Vitals:   03/30/18 1019  BP: 130/80  Pulse: 66  Resp: 18  Temp: (!) 97.5 F (36.4 C)  SpO2: 94%  Weight: 141 lb 3.2 oz (64 kg)  Height: 5\' 2"  (1.575 m)   Body mass index is 25.83 kg/m. Physical Exam Constitutional:      General: She is not in acute distress.    Appearance: Normal appearance. She is not ill-appearing, toxic-appearing or diaphoretic.  HENT:     Head: Normocephalic and atraumatic.     Nose: Nose normal.     Mouth/Throat:     Mouth: Mucous membranes are moist.  Eyes:     Extraocular Movements: Extraocular movements intact.  Pupils: Pupils are equal, round, and reactive to light.  Neck:     Musculoskeletal: Normal range of motion and neck supple. No neck rigidity.  Cardiovascular:     Rate and Rhythm: Normal rate and regular rhythm.     Heart sounds: No murmur.  Pulmonary:     Effort: Pulmonary effort is normal.     Breath sounds: No wheezing, rhonchi or rales.  Abdominal:     General: There is no distension.     Palpations: Abdomen is soft.     Tenderness: There is abdominal tenderness. There is no guarding or rebound.     Comments: Suprapubic area discomfort when palpated.   Musculoskeletal:     Right lower leg: Edema present.     Left lower leg: Edema present.     Comments: Trace edema BLE. Ambulates with walker. Lower back discomfort when palpated.   Skin:    General: Skin is warm and dry.     Findings: No rash.  Neurological:     General: No focal deficit present.     Mental Status: She is alert. Mental status is at baseline.     Cranial Nerves: No cranial nerve deficit.     Motor: No weakness.     Coordination: Coordination normal.     Gait: Gait abnormal.     Comments: Oriented to person and place.   Psychiatric:        Mood and Affect: Mood normal.        Behavior: Behavior normal.     Labs reviewed: Recent Labs    09/13/17 11/08/17 11/22/17 02/10/18  NA 143  --  142 143  K 4.1 4.3 4.3 4.8  CL 108 106 107  --   CO2  27 28 28   --   BUN 18 17 13 16   CREATININE 0.8 0.8 0.8 0.8  CALCIUM 9.9 9.1 9.4  --    Recent Labs    07/26/17 09/13/17 11/08/17 02/10/18  AST 16 18 18 18   ALT 10 12 11 12   ALKPHOS 101 113 94 109  PROT  --  6.5 5.2  --   ALBUMIN 3.8 4.3 3.7  --    Recent Labs    11/08/17 02/09/18 02/10/18 02/24/18  WBC 4.5  --  6.9 9.0  HGB 11.4*  --  11.5* 11.7*  HCT 33* 34*  --  35*  PLT 234  --  239 254   Lab Results  Component Value Date   TSH 2.35 09/13/2017   Lab Results  Component Value Date   HGBA1C 5.8 02/17/2017   Lab Results  Component Value Date   CHOL 197 02/17/2017   HDL 59 02/17/2017   LDLCALC 111 02/17/2017   LDLDIRECT 114.7 08/18/2011   TRIG 153 02/17/2017   CHOLHDL 2 01/23/2015    Significant Diagnostic Results in last 30 days:  No results found.  Assessment/Plan UTI (urinary tract infection) Clinical presumed UTI, empirical ABT, start Nitrofurantoin 100mg  po q12hr x 5 days, FloraStor I bid x 5 days, Pyridium 100mg  tid x 2 days after urine specimen is collected.  Obtain UR C/S CBC/diff.   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 Declined psychotropic meds.      Family/ staff Communication: plan of care reviewed with the patient and charge nurse.   Labs/tests ordered:  UA C/S, CBC/diff  Time spend 25 minutes.

## 2018-03-30 NOTE — Assessment & Plan Note (Addendum)
Clinical presumed UTI, empirical ABT, start Nitrofurantoin 100mg  po q12hr x 5 days, FloraStor I bid x 5 days, Pyridium 100mg  tid x 2 days after urine specimen is collected.  Obtain UR C/S CBC/diff.  03/30/18 UA negative, dc Nitrofurantoin.

## 2018-03-30 NOTE — Assessment & Plan Note (Signed)
Persisted thoughts, but she is able to sleep/eat at her baseline. Declined psychotropic meds.

## 2018-04-01 ENCOUNTER — Encounter: Payer: Self-pay | Admitting: Nurse Practitioner

## 2018-04-14 ENCOUNTER — Other Ambulatory Visit: Payer: Self-pay | Admitting: *Deleted

## 2018-04-14 MED ORDER — PREGABALIN 50 MG PO CAPS: 50.0000 mg | ORAL_CAPSULE | Freq: Every day | ORAL | 0 refills | Status: DC

## 2018-04-22 DIAGNOSIS — R300 Dysuria: Secondary | ICD-10-CM | POA: Diagnosis not present

## 2018-04-24 ENCOUNTER — Encounter: Payer: Self-pay | Admitting: Nurse Practitioner

## 2018-04-24 ENCOUNTER — Non-Acute Institutional Stay: Payer: Medicare Other | Admitting: Nurse Practitioner

## 2018-04-24 DIAGNOSIS — N390 Urinary tract infection, site not specified: Secondary | ICD-10-CM

## 2018-04-24 DIAGNOSIS — M62838 Other muscle spasm: Secondary | ICD-10-CM | POA: Diagnosis not present

## 2018-04-24 NOTE — Assessment & Plan Note (Signed)
Clinically presumed given symptoms of dysuria, urinary frequency, urinary urgency, and suprapubic area tenderness. Pending UA C/S, will start empirical ABT Nitrofurantoin 100mg  bid po x 7 days, FloraStor bid x 7 days, Pyridium 100mg  tid x 2 days. Observe.

## 2018-04-24 NOTE — Assessment & Plan Note (Signed)
Stable, continue Pregabalin 50mg  po qd.

## 2018-04-24 NOTE — Progress Notes (Signed)
Location:  [facility] Room Number: 979 Place of Service:  ALF (13) Provider:  Marlana Latus  NP  Vannah Nadal X, NP  Patient Care Team: Nillie Bartolotta X, NP as PCP - General (Internal Medicine) [place] Urieta X, NP as Nurse Practitioner (Internal Medicine)  Extended Emergency Contact Information Primary Emergency Contact: Culbreth,Robin Address: [address] [place], [place] [address] [place] Phone: [phone] Mobile Phone: [phone] Relation: Daughter  Code Status:  Full Code Goals of care: Advanced Directive information Advanced Directives 04/24/2018  Does Patient Have a Medical Advance Directive? Yes  Type of Advance Directive Living will  Does patient want to make changes to medical advance directive? No - Patient declined  Copy of [place] in Chart? -  Would patient like information on creating a medical advance directive? -  Pre-existing out of facility DNR order (yellow form or pink MOST form) -     Chief Complaint  Patient presents with  . Acute Visit    C/o- dysuria    HPI:  Pt is a 85 y.o. female seen today for an acute visit for dysuria, urinary frequency/urgency, lower abd pain for 2 days, sudden onset, persisted with oral hydration. She denied fever, nausea, vomiting, blood in urine, constipation, or CVA/lower back tenderness. HPI was provided with assistance of staff. She resides in [place] for safety and care assistance. Chronic neck muscle/spasm pain is well managed with Pregabalin 50mg  qd.    Past Medical History:  Diagnosis Date  . Acute blood loss anemia 08/23/2013   04/13/16 TSH 1.20, Na 141, K 4.2, Bun 15, creat 0.79, wbc 5.5, Hgb 11.5, plt 283  . Anxiety   . Anxiety state 07/02/2007   Qualifier: Diagnosis of  By: Lenna Gilford MD, Deborra Medina   . 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 Cigarette smoker   . Colon polyps 2009   TWO TUBULAR ADENOMAS AND HYPERPLASTIC POLYPS  .  Constipation 09/14/2013  . COPD (chronic obstructive pulmonary disease) ([place])   . Diverticulosis of colon   . DJD (degenerative joint disease)   . Dog bite of limb 07/14/2010   Dog bite 07/10/10 - dogs shots utd Last td was 08/2009  Localized tx only.    . Fibromyalgia   . GERD 12/19/2006   Qualifier: Diagnosis of  By: Julien Girt CMA, Marliss Czar  04/13/16 TSH 1.20, Na 141, K 4.2, Bun 15, creat 0.79, wbc 5.5, Hgb 11.5, plt 283   . GERD (gastroesophageal reflux disease)   . Hammer toe of second toe of left foot 04/08/2016   Left 2nd  . Hearing loss   . Hemorrhoids   . Hypercholesterolemia   . Osteoarthritis 12/19/2006   Qualifier: Diagnosis of  By: Julien Girt CMA, Marliss Czar    . Osteoporosis    Past Surgical History:  Procedure Laterality Date  . CATARACT EXTRACTION, BILATERAL  2009  . COLONOSCOPY  2009, 2006 , 2017  . stapes surgery     Dr Thornell Mule  . TONSILLECTOMY AND ADENOIDECTOMY     as a child    Allergies  Allergen Reactions  . Penicillins Anaphylaxis  . Bisphosphonates Other (See Comments)    [name]  . Influenza Vaccines     Pt reported  . Simvastatin     Unknown   . Trazodone And Nefazodone     Felt her throat was closing up/kept her awake  . Sulfonamide Derivatives Rash and  Other (See Comments)    blisters    Outpatient Encounter Medications as of 04/24/2018  Medication Sig  . calcium carbonate (TUMS EX) 750 MG chewable tablet Chew 750 mg by mouth daily.   . Calcium Carbonate-Vit D-Min (CALCIUM 600+D PLUS MINERALS) 600-400 MG-UNIT TABS Take 1 tablet by mouth daily.   . cholecalciferol (VITAMIN D) 1000 UNITS tablet Take 1,000 Units by mouth daily.  . cyclobenzaprine (FLEXERIL) 5 MG tablet Take 2.5 mg by mouth at bedtime.  . hydrocortisone cream 1 % Apply 1 application topically 2 (two) times daily as needed. Apply to wrists  . lisinopril (PRINIVIL,ZESTRIL) 2.5 MG tablet Take 2.5 mg by mouth daily.  . naproxen sodium (ALEVE) 220 MG tablet Take 220 mg by mouth daily as needed. Take  1 tablet at dinner  . omeprazole (PRILOSEC) 20 MG capsule Take 20 mg by mouth 2 (two) times daily.   . polyethylene glycol (MIRALAX / GLYCOLAX) packet Take 17 g by mouth daily as needed.   . polyethylene glycol (MIRALAX / GLYCOLAX) packet Take 17 g by mouth daily. On Sun and Wed  . pregabalin (LYRICA) 50 MG capsule Take 1 capsule (50 mg total) by mouth daily.   No facility-administered encounter medications on file as of 04/24/2018.    ROS was provided with assistance of staff.  Review of Systems  Constitutional: Positive for fatigue. Negative for activity change, appetite change, chills, diaphoresis and fever.  HENT: Positive for hearing loss. Negative for congestion and voice change.   Respiratory: Negative for cough, shortness of breath and wheezing.   Cardiovascular: Positive for leg swelling. Negative for chest pain and palpitations.  Gastrointestinal: Positive for abdominal pain. Negative for abdominal distention, constipation, diarrhea, nausea and vomiting.  Genitourinary: Positive for dysuria, frequency and urgency. Negative for difficulty urinating and hematuria.  Musculoskeletal: Positive for gait problem.       Neck muscle spasm/pain.   Skin: Negative for color change and pallor.  Neurological: Negative for dizziness, speech difficulty, weakness and headaches.       Memory lapses.   Psychiatric/Behavioral: Positive for sleep disturbance. Negative for agitation, behavioral problems and hallucinations. The patient is not nervous/anxious.        Chronic insomnia.     Immunization History  Administered Date(s) Administered  . Pneumococcal Conjugate-13 09/01/2015  . Pneumococcal Polysaccharide-23 07/07/1998  . Td 08/21/2009   Pertinent  Health Maintenance Due  Topic Date Due  . PNA vac Low Risk Adult (2 of 2 - PPSV23) 08/31/2016  . DEXA SCAN  Completed  . INFLUENZA VACCINE  Discontinued   Fall Risk  11/08/2017 11/05/2016 09/01/2015 08/31/2012  Falls in the past year? No Yes Yes  No  Number falls in past yr: - 2 or more 1 -  Injury with Fall? - Yes No -  Risk for fall due to : - - Impaired balance/gait -   Functional Status Survey:    Vitals:   04/24/18 1129  BP: 140/74  Pulse: 80  Resp: 18  Temp: (!) 97.3 F (36.3 C)  SpO2: 94%  Weight: 139 lb 9.6 oz (63.3 kg)  Height: 5\' 2"  (1.575 m)   Body mass index is 25.53 kg/m. Physical Exam Vitals signs and nursing note reviewed.  Constitutional:      General: She is not in acute distress.    Appearance: Normal appearance. She is not ill-appearing or diaphoretic.  HENT:     Head: Normocephalic and atraumatic.     Mouth/Throat:     Mouth:  Mucous membranes are dry.  Eyes:     Pupils: Pupils are equal, round, and reactive to light.  Neck:     Musculoskeletal: Normal range of motion and neck supple. No neck rigidity or muscular tenderness.  Cardiovascular:     Rate and Rhythm: Normal rate and regular rhythm.     Heart sounds: No murmur.  Pulmonary:     Effort: Pulmonary effort is normal.     Breath sounds: No wheezing, rhonchi or rales.  Abdominal:     General: There is no distension.     Palpations: Abdomen is soft.     Tenderness: There is abdominal tenderness. There is no right CVA tenderness, left CVA tenderness, guarding or rebound.  Musculoskeletal:     Right lower leg: Edema present.     Left lower leg: Edema present.     Comments: Trace edema BLE. Ambulates with walker.   Skin:    General: Skin is warm and dry.  Neurological:     General: No focal deficit present.     Mental Status: She is alert. Mental status is at baseline.     Cranial Nerves: No cranial nerve deficit.     Motor: No weakness.     Coordination: Coordination normal.     Gait: Gait abnormal.     Comments: Oriented to person and place.   Psychiatric:        Mood and Affect: Mood normal.        Behavior: Behavior normal.     Labs reviewed: Recent Labs    09/13/17 11/08/17 11/22/17 02/10/18  NA 143  --  142 143  K  4.1 4.3 4.3 4.8  CL 108 106 107  --   CO2 27 28 28   --   BUN 18 17 13 16   CREATININE 0.8 0.8 0.8 0.8  CALCIUM 9.9 9.1 9.4  --    Recent Labs    07/26/17 09/13/17 11/08/17 02/10/18  AST 16 18 18 18   ALT 10 12 11 12   ALKPHOS 101 113 94 109  PROT  --  6.5 5.2  --   ALBUMIN 3.8 4.3 3.7  --    Recent Labs    02/09/18 02/10/18 02/24/18 03/30/18  WBC  --  6.9 9.0 5.5  HGB  --  11.5* 11.7* 12.1  HCT 34*  --  35* 36  PLT  --  239 254 237   Lab Results  Component Value Date   TSH 2.35 09/13/2017   Lab Results  Component Value Date   HGBA1C 5.8 02/17/2017   Lab Results  Component Value Date   CHOL 197 02/17/2017   HDL 59 02/17/2017   LDLCALC 111 02/17/2017   LDLDIRECT 114.7 08/18/2011   TRIG 153 02/17/2017   CHOLHDL 2 01/23/2015    Significant Diagnostic Results in last 30 days:  No results found.  Assessment/Plan UTI (urinary tract infection) Clinically presumed given symptoms of dysuria, urinary frequency, urinary urgency, and suprapubic area tenderness. Pending UA C/S, will start empirical ABT Nitrofurantoin 100mg  bid po x 7 days, FloraStor bid x 7 days, Pyridium 100mg  tid x 2 days. Observe.   Neck muscle spasm Stable, continue Pregabalin 50mg  po qd.      Family/ staff Communication: plan of care reviewed with the patient and charge nurse.   Labs/tests ordered:  Pending UA C/S  Time spend 25 minutes.

## 2018-05-04 ENCOUNTER — Non-Acute Institutional Stay: Payer: Medicare Other | Admitting: Nurse Practitioner

## 2018-05-04 ENCOUNTER — Encounter: Payer: Self-pay | Admitting: Nurse Practitioner

## 2018-05-04 DIAGNOSIS — K5901 Slow transit constipation: Secondary | ICD-10-CM

## 2018-05-04 DIAGNOSIS — K219 Gastro-esophageal reflux disease without esophagitis: Secondary | ICD-10-CM | POA: Diagnosis not present

## 2018-05-04 DIAGNOSIS — I10 Essential (primary) hypertension: Secondary | ICD-10-CM | POA: Diagnosis not present

## 2018-05-04 DIAGNOSIS — M62838 Other muscle spasm: Secondary | ICD-10-CM | POA: Diagnosis not present

## 2018-05-04 NOTE — Assessment & Plan Note (Signed)
Neck muscle spasm/pain, controlled, continue Pregabalin 50mg  qd, prn Naproxen 220mg  qd.

## 2018-05-04 NOTE — Progress Notes (Signed)
Location:  [facility] Room Number: 098 Place of Service:  ALF (13) Provider:  Marlana Latus  NP  Boluwatife Flight X, NP  Patient Care Team: Viki Carrera X, NP as PCP - General (Internal Medicine) Ainhoa Rallo X, NP as Nurse Practitioner (Internal Medicine)  Extended Emergency Contact Information Primary Emergency Contact: Culbreth,Robin Address: [address] [place], [place] [address] [place] Phone: [phone] Mobile Phone: [phone] Relation: Daughter  Code Status:  Full Code Goals of care: Advanced Directive information Advanced Directives 04/24/2018  Does Patient Have a Medical Advance Directive? Yes  Type of Advance Directive Living will  Does patient want to make changes to medical advance directive? No - Patient declined  Copy of [place] in Chart? -  Would patient like information on creating a medical advance directive? -  Pre-existing out of facility DNR order (yellow form or pink MOST form) -     Chief Complaint  Patient presents with  . Medical Management of Chronic Issues    HPI:  Pt is a 85 y.o. female seen today for medical management of chronic diseases.     The patient resides in [place] for safety and care assistance. HTN, blood pressure is controlled, on Lisinopril 2.5mg  qd. Neck muscle spasm/pain, controlled on Pregabalin 50mg  qd, prn Naproxen 220mg  qd. GERD stable on Omeprazole 20mg  bid. Constipation, stable on MiraLax qd.    Past Medical History:  Diagnosis Date  . Acute blood loss anemia 08/23/2013   04/13/16 TSH 1.20, Na 141, K 4.2, Bun 15, creat 0.79, wbc 5.5, Hgb 11.5, plt 283  . Anxiety   . Anxiety state 07/02/2007   Qualifier: Diagnosis of  By: Lenna Gilford MD, Deborra Medina   . 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 Cigarette smoker   . Colon polyps 2009   TWO TUBULAR ADENOMAS AND HYPERPLASTIC POLYPS  . Constipation 09/14/2013  . COPD (chronic obstructive pulmonary disease)  ([place])   . Diverticulosis of colon   . DJD (degenerative joint disease)   . Dog bite of limb 07/14/2010   Dog bite 07/10/10 - dogs shots utd Last td was 08/2009  Localized tx only.    . Fibromyalgia   . GERD 12/19/2006   Qualifier: Diagnosis of  By: Julien Girt CMA, Marliss Czar  04/13/16 TSH 1.20, Na 141, K 4.2, Bun 15, creat 0.79, wbc 5.5, Hgb 11.5, plt 283   . GERD (gastroesophageal reflux disease)   . Hammer toe of second toe of left foot 04/08/2016   Left 2nd  . Hearing loss   . Hemorrhoids   . Hypercholesterolemia   . Osteoarthritis 12/19/2006   Qualifier: Diagnosis of  By: Julien Girt CMA, Marliss Czar    . Osteoporosis    Past Surgical History:  Procedure Laterality Date  . CATARACT EXTRACTION, BILATERAL  2009  . COLONOSCOPY  2009, 2006 , 2017  . stapes surgery     Dr Thornell Mule  . TONSILLECTOMY AND ADENOIDECTOMY     as a child    Allergies  Allergen Reactions  . Penicillins Anaphylaxis  . Bisphosphonates Other (See Comments)    [name]  . Influenza Vaccines     Pt reported  . Simvastatin     Unknown   . Trazodone And Nefazodone     Felt her throat was closing up/kept her awake  . Sulfonamide Derivatives Rash and Other (See Comments)    blisters  Outpatient Encounter Medications as of 05/04/2018  Medication Sig  . calcium carbonate (TUMS EX) 750 MG chewable tablet Chew 750 mg by mouth daily.   . Calcium Carbonate-Vit D-Min (CALCIUM 600+D PLUS MINERALS) 600-400 MG-UNIT TABS Take 1 tablet by mouth daily.   . cholecalciferol (VITAMIN D) 1000 UNITS tablet Take 1,000 Units by mouth daily.  . cyclobenzaprine (FLEXERIL) 5 MG tablet Take 2.5 mg by mouth at bedtime.  . hydrocortisone cream 1 % Apply 1 application topically 2 (two) times daily as needed. Apply to wrists  . lisinopril (PRINIVIL,ZESTRIL) 2.5 MG tablet Take 2.5 mg by mouth daily.  . naproxen sodium (ALEVE) 220 MG tablet Take 220 mg by mouth daily as needed. Take 1 tablet at dinner  . omeprazole (PRILOSEC) 20 MG capsule Take 20 mg  by mouth 2 (two) times daily.   . polyethylene glycol (MIRALAX / GLYCOLAX) packet Take 17 g by mouth daily as needed.   . polyethylene glycol (MIRALAX / GLYCOLAX) packet Take 17 g by mouth daily. On Sun and Wed  . pregabalin (LYRICA) 50 MG capsule Take 1 capsule (50 mg total) by mouth daily.   No facility-administered encounter medications on file as of 05/04/2018.    ROS was provided with assistance of staff Review of Systems  Constitutional: Negative for activity change, appetite change, chills, diaphoresis, fatigue, fever and unexpected weight change.  HENT: Positive for hearing loss. Negative for congestion and voice change.   Respiratory: Negative for cough, shortness of breath and wheezing.   Cardiovascular: Positive for leg swelling. Negative for chest pain and palpitations.  Gastrointestinal: Negative for abdominal distention, abdominal pain, constipation, diarrhea, nausea and vomiting.  Genitourinary: Negative for difficulty urinating, dysuria and urgency.  Musculoskeletal: Positive for arthralgias, back pain and gait problem.  Skin: Negative for color change and pallor.  Neurological: Negative for dizziness, speech difficulty, weakness and headaches.       Memory lapses.   Psychiatric/Behavioral: Negative for agitation, behavioral problems, hallucinations and sleep disturbance. The patient is not nervous/anxious.     Immunization History  Administered Date(s) Administered  . Pneumococcal Conjugate-13 09/01/2015  . Pneumococcal Polysaccharide-23 07/07/1998  . Td 08/21/2009   Pertinent  Health Maintenance Due  Topic Date Due  . PNA vac Low Risk Adult (2 of 2 - PPSV23) 08/31/2016  . DEXA SCAN  Completed  . INFLUENZA VACCINE  Discontinued   Fall Risk  11/08/2017 11/05/2016 09/01/2015 08/31/2012  Falls in the past year? No Yes Yes No  Number falls in past yr: - 2 or more 1 -  Injury with Fall? - Yes No -  Risk for fall due to : - - Impaired balance/gait -   Functional Status  Survey:    Vitals:   05/04/18 0907  BP: 130/80  Pulse: 74  Resp: 18  Temp: 97.7 F (36.5 C)  SpO2: 93%  Weight: 139 lb 9.6 oz (63.3 kg)  Height: 5\' 2"  (1.575 m)   Body mass index is 25.53 kg/m. Physical Exam Constitutional:      General: She is not in acute distress.    Appearance: Normal appearance. She is normal weight. She is not ill-appearing, toxic-appearing or diaphoretic.  HENT:     Head: Normocephalic and atraumatic.     Nose: Nose normal.     Mouth/Throat:     Mouth: Mucous membranes are moist.  Eyes:     Extraocular Movements: Extraocular movements intact.     Pupils: Pupils are equal, round, and reactive to light.  Neck:  Musculoskeletal: Normal range of motion and neck supple. No neck rigidity or muscular tenderness.  Cardiovascular:     Rate and Rhythm: Normal rate and regular rhythm.     Heart sounds: No murmur.  Pulmonary:     Effort: Pulmonary effort is normal.     Breath sounds: No wheezing, rhonchi or rales.  Abdominal:     General: There is no distension.     Palpations: Abdomen is soft.     Tenderness: There is no abdominal tenderness. There is no guarding or rebound.  Musculoskeletal:     Right lower leg: Edema present.     Left lower leg: Edema present.     Comments: Trace edema BLE, ambulates with walker.   Skin:    General: Skin is warm and dry.  Neurological:     General: No focal deficit present.     Mental Status: She is alert. Mental status is at baseline.     Cranial Nerves: No cranial nerve deficit.     Motor: No weakness.     Coordination: Coordination normal.     Gait: Gait abnormal.     Comments: Oriented to person and place.   Psychiatric:        Mood and Affect: Mood normal.        Behavior: Behavior normal.     Labs reviewed: Recent Labs    09/13/17 11/08/17 11/22/17 02/10/18  NA 143  --  142 143  K 4.1 4.3 4.3 4.8  CL 108 106 107  --   CO2 27 28 28   --   BUN 18 17 13 16   CREATININE 0.8 0.8 0.8 0.8  CALCIUM  9.9 9.1 9.4  --    Recent Labs    07/26/17 09/13/17 11/08/17 02/10/18  AST 16 18 18 18   ALT 10 12 11 12   ALKPHOS 101 113 94 109  PROT  --  6.5 5.2  --   ALBUMIN 3.8 4.3 3.7  --    Recent Labs    02/09/18 02/10/18 02/24/18 03/30/18  WBC  --  6.9 9.0 5.5  HGB  --  11.5* 11.7* 12.1  HCT 34*  --  35* 36  PLT  --  239 254 237   Lab Results  Component Value Date   TSH 2.35 09/13/2017   Lab Results  Component Value Date   HGBA1C 5.8 02/17/2017   Lab Results  Component Value Date   CHOL 197 02/17/2017   HDL 59 02/17/2017   LDLCALC 111 02/17/2017   LDLDIRECT 114.7 08/18/2011   TRIG 153 02/17/2017   CHOLHDL 2 01/23/2015    Significant Diagnostic Results in last 30 days:  No results found.  Assessment/Plan Hypertension Controlled, continue Lisinopril 2.5mg  qd.   GERD Stable, continue  Omeprazole 20mg  bid.   Constipation Stable, continue  MiraLax qd.    Neck muscle spasm Neck muscle spasm/pain, controlled, continue Pregabalin 50mg  qd, prn Naproxen 220mg  qd.      Family/ staff Communication: plan of care reviewed with the patient and charge nurse.   Labs/tests ordered:  none  Time spend 25 minutes.

## 2018-05-04 NOTE — Assessment & Plan Note (Signed)
Stable, continue Omeprazole 20mg bid.  

## 2018-05-04 NOTE — Assessment & Plan Note (Signed)
Stable, continue MiraLax qd.  

## 2018-05-04 NOTE — Assessment & Plan Note (Signed)
Controlled, continue Lisinopril 2.5mg  qd.

## 2018-05-15 ENCOUNTER — Other Ambulatory Visit: Payer: Self-pay

## 2018-05-15 MED ORDER — PREGABALIN 50 MG PO CAPS: 50.0000 mg | ORAL_CAPSULE | Freq: Every day | ORAL | 0 refills | Status: DC

## 2018-06-01 ENCOUNTER — Encounter: Payer: Self-pay | Admitting: Nurse Practitioner

## 2018-06-01 ENCOUNTER — Non-Acute Institutional Stay: Payer: Medicare Other | Admitting: Nurse Practitioner

## 2018-06-01 DIAGNOSIS — R300 Dysuria: Secondary | ICD-10-CM | POA: Diagnosis not present

## 2018-06-01 DIAGNOSIS — K5901 Slow transit constipation: Secondary | ICD-10-CM | POA: Diagnosis not present

## 2018-06-01 DIAGNOSIS — N390 Urinary tract infection, site not specified: Secondary | ICD-10-CM | POA: Diagnosis not present

## 2018-06-01 DIAGNOSIS — R110 Nausea: Secondary | ICD-10-CM | POA: Diagnosis not present

## 2018-06-01 NOTE — Progress Notes (Signed)
Location:  [facility] Room Number: 812 Place of Service:  ALF (13) Provider:  Marlana Latus  NP  Mast, Man X, NP  Patient Care Team: Mast, Man X, NP as PCP - General (Internal Medicine) Mast, Man X, NP as Nurse Practitioner (Internal Medicine)  Extended Emergency Contact Information Primary Emergency Contact: Culbreth,Robin Address: [address] [place], [place] [address] [place] Phone: [phone] Mobile Phone: [phone] Relation: Daughter  Code Status:  Full Code Goals of care: Advanced Directive information Advanced Directives 06/01/2018  Does Patient Have a Medical Advance Directive? Yes  Type of Advance Directive Living will  Does patient want to make changes to medical advance directive? No - Patient declined  Copy of [place] in Chart? -  Would patient like information on creating a medical advance directive? -  Pre-existing out of facility DNR order (yellow form or pink MOST form) -     Chief Complaint  Patient presents with  . Acute Visit    C/o - UTI symptoms    HPI:  Pt is a 85 y.o. female seen today for an acute visit for c/o dysuria, urinary urgency, urinary frequency, incontinent of urine, lower abd pain, stated her urine is cloudy, malaise. She denied fever, stated feeling nauseous x1, no vomiting, or back pain. Hx of constipation, last BM this morning, on daily MiraLax and daily prn.    Past Medical History:  Diagnosis Date  . Acute blood loss anemia 08/23/2013   04/13/16 TSH 1.20, Na 141, K 4.2, Bun 15, creat 0.79, wbc 5.5, Hgb 11.5, plt 283  . Anxiety   . Anxiety state 07/02/2007   Qualifier: Diagnosis of  By: Lenna Gilford MD, Deborra Medina   . 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 Cigarette smoker   . Colon polyps 2009   TWO TUBULAR ADENOMAS AND HYPERPLASTIC POLYPS  . Constipation 09/14/2013  . COPD (chronic obstructive pulmonary disease) ([place])   . Diverticulosis of  colon   . DJD (degenerative joint disease)   . Dog bite of limb 07/14/2010   Dog bite 07/10/10 - dogs shots utd Last td was 08/2009  Localized tx only.    . Fibromyalgia   . GERD 12/19/2006   Qualifier: Diagnosis of  By: Julien Girt CMA, Marliss Czar  04/13/16 TSH 1.20, Na 141, K 4.2, Bun 15, creat 0.79, wbc 5.5, Hgb 11.5, plt 283   . GERD (gastroesophageal reflux disease)   . Hammer toe of second toe of left foot 04/08/2016   Left 2nd  . Hearing loss   . Hemorrhoids   . Hypercholesterolemia   . Osteoarthritis 12/19/2006   Qualifier: Diagnosis of  By: Julien Girt CMA, Marliss Czar    . Osteoporosis    Past Surgical History:  Procedure Laterality Date  . CATARACT EXTRACTION, BILATERAL  2009  . COLONOSCOPY  2009, 2006 , 2017  . stapes surgery     Dr Thornell Mule  . TONSILLECTOMY AND ADENOIDECTOMY     as a child    Allergies  Allergen Reactions  . Penicillins Anaphylaxis  . Bisphosphonates Other (See Comments)    [name]  . Influenza Vaccines     Pt reported  . Simvastatin     Unknown   . Trazodone And Nefazodone     Felt her throat was closing up/kept her awake  . Sulfonamide Derivatives Rash and Other (See Comments)    blisters    Outpatient  Encounter Medications as of 06/01/2018  Medication Sig  . calcium carbonate (TUMS EX) 750 MG chewable tablet Chew 750 mg by mouth daily.   . Calcium Carbonate-Vit D-Min (CALCIUM 600+D PLUS MINERALS) 600-400 MG-UNIT TABS Take 1 tablet by mouth daily.   . cholecalciferol (VITAMIN D) 1000 UNITS tablet Take 1,000 Units by mouth daily.  . cyclobenzaprine (FLEXERIL) 5 MG tablet Take 2.5 mg by mouth at bedtime.  . hydrocortisone cream 1 % Apply 1 application topically 2 (two) times daily as needed. Apply to wrists  . lisinopril (PRINIVIL,ZESTRIL) 2.5 MG tablet Take 2.5 mg by mouth daily.  . naproxen sodium (ALEVE) 220 MG tablet Take 220 mg by mouth daily as needed. Take 1 tablet at dinner  . omeprazole (PRILOSEC) 20 MG capsule Take 20 mg by mouth 2 (two) times daily.    . polyethylene glycol (MIRALAX / GLYCOLAX) packet Take 17 g by mouth daily as needed.   . polyethylene glycol (MIRALAX / GLYCOLAX) packet Take 17 g by mouth daily. On Sun and Wed  . pregabalin (LYRICA) 50 MG capsule Take 1 capsule (50 mg total) by mouth daily.   No facility-administered encounter medications on file as of 06/01/2018.     Review of Systems  Constitutional: Positive for appetite change and fatigue. Negative for activity change, chills, diaphoresis and fever.  HENT: Positive for hearing loss. Negative for congestion and voice change.   Respiratory: Negative for cough, shortness of breath and wheezing.   Cardiovascular: Positive for leg swelling. Negative for chest pain and palpitations.  Gastrointestinal: Positive for abdominal pain and nausea. Negative for abdominal distention, constipation, diarrhea and vomiting.  Genitourinary: Positive for dysuria, frequency and urgency. Negative for difficulty urinating, flank pain, hematuria and vaginal discharge.       Incontinent of urine.   Musculoskeletal: Positive for back pain and gait problem.  Skin: Negative for color change.  Neurological: Negative for dizziness, speech difficulty, weakness and headaches.       Memory lapses.   Psychiatric/Behavioral: Negative for agitation, behavioral problems, hallucinations and sleep disturbance. The patient is not nervous/anxious.     Immunization History  Administered Date(s) Administered  . Pneumococcal Conjugate-13 09/01/2015  . Pneumococcal Polysaccharide-23 07/07/1998  . Td 08/21/2009   Pertinent  Health Maintenance Due  Topic Date Due  . PNA vac Low Risk Adult (2 of 2 - PPSV23) 08/31/2016  . DEXA SCAN  Completed  . INFLUENZA VACCINE  Discontinued   Fall Risk  11/08/2017 11/05/2016 09/01/2015 08/31/2012  Falls in the past year? No Yes Yes No  Number falls in past yr: - 2 or more 1 -  Injury with Fall? - Yes No -  Risk for fall due to : - - Impaired balance/gait -    Functional Status Survey:    Vitals:   06/01/18 1037  BP: 122/76  Pulse: 68  Resp: 18  Temp: (!) 96.9 F (36.1 C)  SpO2: 93%  Weight: 139 lb 9.6 oz (63.3 kg)  Height: 5' 2"  (1.575 m)   Body mass index is 25.53 kg/m. Physical Exam Vitals signs and nursing note reviewed.  Constitutional:      Appearance: Normal appearance. She is ill-appearing. She is not toxic-appearing.  HENT:     Head: Normocephalic and atraumatic.     Nose: Nose normal.     Mouth/Throat:     Mouth: Mucous membranes are moist.  Eyes:     Extraocular Movements: Extraocular movements intact.     Conjunctiva/sclera: Conjunctivae normal.  Pupils: Pupils are equal, round, and reactive to light.  Neck:     Musculoskeletal: Normal range of motion and neck supple.  Cardiovascular:     Rate and Rhythm: Normal rate and regular rhythm.     Heart sounds: No murmur.  Pulmonary:     Effort: Pulmonary effort is normal.     Breath sounds: No wheezing or rales.  Abdominal:     General: There is no distension.     Palpations: Abdomen is soft.     Tenderness: There is abdominal tenderness. There is no right CVA tenderness, left CVA tenderness, guarding or rebound.     Comments: Lower abd, suprapubic area tenderness on palpation.   Musculoskeletal:     Right lower leg: Edema present.     Left lower leg: Edema present.     Comments: Chronic trace edema BLE. Ambulates with walker.   Skin:    General: Skin is warm and dry.  Neurological:     General: No focal deficit present.     Mental Status: She is alert. Mental status is at baseline.     Cranial Nerves: No cranial nerve deficit.     Motor: No weakness.     Coordination: Coordination normal.     Gait: Gait abnormal.     Comments: Oriented to person and place.   Psychiatric:        Mood and Affect: Mood normal.        Behavior: Behavior normal.     Labs reviewed: Recent Labs    09/13/17 11/08/17 11/22/17 02/10/18  NA 143  --  142 143  K 4.1 4.3 4.3  4.8  CL 108 106 107  --   CO2 27 28 28   --   BUN 18 17 13 16   CREATININE 0.8 0.8 0.8 0.8  CALCIUM 9.9 9.1 9.4  --    Recent Labs    07/26/17 09/13/17 11/08/17 02/10/18  AST 16 18 18 18   ALT 10 12 11 12   ALKPHOS 101 113 94 109  PROT  --  6.5 5.2  --   ALBUMIN 3.8 4.3 3.7  --    Recent Labs    02/09/18 02/10/18 02/24/18 03/30/18  WBC  --  6.9 9.0 5.5  HGB  --  11.5* 11.7* 12.1  HCT 34*  --  35* 36  PLT  --  239 254 237   Lab Results  Component Value Date   TSH 2.35 09/13/2017   Lab Results  Component Value Date   HGBA1C 5.8 02/17/2017   Lab Results  Component Value Date   CHOL 197 02/17/2017   HDL 59 02/17/2017   LDLCALC 111 02/17/2017   LDLDIRECT 114.7 08/18/2011   TRIG 153 02/17/2017   CHOLHDL 2 01/23/2015    Significant Diagnostic Results in last 30 days:  No results found.  Assessment/Plan UTI (urinary tract infection) dysuria, urinary urgency, urinary frequency, incontinent of urine, lower abd pain, stated her urine is cloudy, malaise. She denied fever, stated feeling nauseous x1, no vomiting, or back pain. Will start empirical ABT Nitrofurantoin 157m bid x 7 days, FloraStor bid x 7 days after urine specimen is collected for UA C/S, update CBC/diff, CMP/eGFR next lab.   Constipation Stable, continue MiraLax qd, qd prn.   Nausea Feeling nauseous, but not today, may be related to clinical presumed UTI, will continue to monitor.      Family/ staff Communication: plan of care reviewed with the patient and charge nurse.   Labs/tests  ordered:  UA C/S, CBC/diff, CMP/eGFR  Time spend 40 minutes.

## 2018-06-01 NOTE — Assessment & Plan Note (Signed)
Stable, continue MiraLax qd, qd prn.

## 2018-06-01 NOTE — Assessment & Plan Note (Signed)
Feeling nauseous, but not today, may be related to clinical presumed UTI, will continue to monitor.

## 2018-06-01 NOTE — Assessment & Plan Note (Signed)
dysuria, urinary urgency, urinary frequency, incontinent of urine, lower abd pain, stated her urine is cloudy, malaise. She denied fever, stated feeling nauseous x1, no vomiting, or back pain. Will start empirical ABT Nitrofurantoin 14m bid x 7 days, FloraStor bid x 7 days after urine specimen is collected for UA C/S, update CBC/diff, CMP/eGFR next lab.

## 2018-06-06 DIAGNOSIS — I10 Essential (primary) hypertension: Secondary | ICD-10-CM | POA: Diagnosis not present

## 2018-06-06 DIAGNOSIS — R300 Dysuria: Secondary | ICD-10-CM | POA: Diagnosis not present

## 2018-06-06 DIAGNOSIS — N390 Urinary tract infection, site not specified: Secondary | ICD-10-CM | POA: Diagnosis not present

## 2018-06-14 ENCOUNTER — Other Ambulatory Visit: Payer: Self-pay | Admitting: *Deleted

## 2018-06-14 MED ORDER — PREGABALIN 50 MG PO CAPS: 50.0000 mg | ORAL_CAPSULE | Freq: Every day | ORAL | 0 refills | Status: DC

## 2018-06-14 NOTE — Telephone Encounter (Signed)
Received fax order refill Request from [facility]  Pended Rx and sent to [facility] for approval.

## 2018-06-20 ENCOUNTER — Encounter: Payer: Self-pay | Admitting: Internal Medicine

## 2018-06-20 ENCOUNTER — Non-Acute Institutional Stay: Payer: Medicare Other | Admitting: Internal Medicine

## 2018-06-20 DIAGNOSIS — I10 Essential (primary) hypertension: Secondary | ICD-10-CM

## 2018-06-20 DIAGNOSIS — J449 Chronic obstructive pulmonary disease, unspecified: Secondary | ICD-10-CM

## 2018-06-20 DIAGNOSIS — R442 Other hallucinations: Secondary | ICD-10-CM

## 2018-06-20 NOTE — Progress Notes (Signed)
Location:  [facility] [place] Room Number: 836 Place of Service:  ALF (38) Provider:Alejandro Gamel L.MD   Mast, Man X, NP  Patient Care Team: Mast, Man X, NP as PCP - General (Internal Medicine) Mast, Man X, NP as Nurse Practitioner (Internal Medicine)  Extended Emergency Contact Information Primary Emergency Contact: Culbreth,Robin Address: [address] [place], [place] [address] [place] Phone: [phone] Mobile Phone: [phone] Relation: Daughter  Code Status: Full code  Goals of care: Advanced Directive information Advanced Directives 06/20/2018  Does Patient Have a Medical Advance Directive? Yes  Type of Advance Directive Living will  Does patient want to make changes to medical advance directive? No - Patient declined  Copy of [place] in Chart? -  Would patient like information on creating a medical advance directive? -  Pre-existing out of facility DNR order (yellow form or pink MOST form) -     Chief Complaint  Patient presents with  . Acute Visit    Hallunications    HPI:  Pt is a 85 y.o. female seen today for an acute visit for Tactile Hallucinations Patient is long term resident of AL unit in [place]. Patient has h/o fibromyalgia on Lyrica ,hypertension, hyperlipidemia ,anemia, osteoporosis,COPD , Insomnia Patient has a history of tactile hallucinations.  She was on Risperdal before.  She describes that they are all over her skin and give her  pain when she goes to the bathroom. She has been using Listerine and peroxide  cleaning them Patient is also struggling due to this quarantine. She says she cannot go to the lunchroom.  Cannot see her family over the weekend.  She is also complaining of insomnia and Depression. Her weight is stable. Appetite is fair     Past Medical History:  Diagnosis Date  . Acute blood loss anemia 08/23/2013   04/13/16 TSH 1.20, Na 141, K 4.2, Bun 15,  creat 0.79, wbc 5.5, Hgb 11.5, plt 283  . Anxiety   . Anxiety state 07/02/2007   Qualifier: Diagnosis of  By: Lenna Gilford MD, Deborra Medina   . 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 Cigarette smoker   . Colon polyps 2009   TWO TUBULAR ADENOMAS AND HYPERPLASTIC POLYPS  . Constipation 09/14/2013  . COPD (chronic obstructive pulmonary disease) ([place])   . Diverticulosis of colon   . DJD (degenerative joint disease)   . Dog bite of limb 07/14/2010   Dog bite 07/10/10 - dogs shots utd Last td was 08/2009  Localized tx only.    . Fibromyalgia   . GERD 12/19/2006   Qualifier: Diagnosis of  By: Julien Girt CMA, Marliss Czar  04/13/16 TSH 1.20, Na 141, K 4.2, Bun 15, creat 0.79, wbc 5.5, Hgb 11.5, plt 283   . GERD (gastroesophageal reflux disease)   . Hammer toe of second toe of left foot 04/08/2016   Left 2nd  . Hearing loss   . Hemorrhoids   . Hypercholesterolemia   . Osteoarthritis 12/19/2006   Qualifier: Diagnosis of  By: Julien Girt CMA, Marliss Czar    . Osteoporosis    Past Surgical History:  Procedure Laterality Date  . CATARACT EXTRACTION, BILATERAL  2009  . COLONOSCOPY  2009, 2006 , 2017  . stapes surgery     Dr Thornell Mule  . TONSILLECTOMY AND ADENOIDECTOMY     as a child    Allergies  Allergen Reactions  . Penicillins Anaphylaxis  . Bisphosphonates  Other (See Comments)    [name]  . Influenza Vaccines     Pt reported  . Simvastatin     Unknown   . Trazodone And Nefazodone     Felt her throat was closing up/kept her awake  . Sulfonamide Derivatives Rash and Other (See Comments)    blisters    Outpatient Encounter Medications as of 06/20/2018  Medication Sig  . calcium carbonate (TUMS EX) 750 MG chewable tablet Chew 750 mg by mouth daily.   . Calcium Carbonate-Vit D-Min (CALCIUM 600+D PLUS MINERALS) 600-400 MG-UNIT TABS Take 1 tablet by mouth daily.   . cholecalciferol (VITAMIN D) 1000 UNITS tablet Take 1,000 Units by mouth daily.  . cyclobenzaprine (FLEXERIL) 5 MG tablet Take 2.5 mg by mouth  at bedtime.  . hydrocortisone cream 1 % Apply 1 application topically 2 (two) times daily as needed. Apply to wrists  . lisinopril (PRINIVIL,ZESTRIL) 2.5 MG tablet Take 2.5 mg by mouth daily.  . naproxen sodium (ALEVE) 220 MG tablet Take 220 mg by mouth daily as needed. Take 1 tablet at dinner  . omeprazole (PRILOSEC) 20 MG capsule Take 20 mg by mouth 2 (two) times daily.   . polyethylene glycol (MIRALAX / GLYCOLAX) packet Take 17 g by mouth daily as needed.   . pregabalin (LYRICA) 50 MG capsule Take 1 capsule (50 mg total) by mouth daily.  . [DISCONTINUED] polyethylene glycol (MIRALAX / GLYCOLAX) packet Take 17 g by mouth daily. On Sun and Wed   No facility-administered encounter medications on file as of 06/20/2018.     Review of Systems  Constitutional: Negative.   HENT: Negative.   Respiratory: Negative.   Gastrointestinal: Positive for abdominal pain.  Genitourinary: Positive for difficulty urinating and dysuria.  Musculoskeletal: Negative.   Skin: Negative.   Neurological: Negative.   Psychiatric/Behavioral: Positive for dysphoric mood and hallucinations.    Immunization History  Administered Date(s) Administered  . Pneumococcal Conjugate-13 09/01/2015  . Pneumococcal Polysaccharide-23 07/07/1998  . Td 08/21/2009   Pertinent  Health Maintenance Due  Topic Date Due  . PNA vac Low Risk Adult (2 of 2 - PPSV23) 08/31/2016  . DEXA SCAN  Completed  . INFLUENZA VACCINE  Discontinued   Fall Risk  11/08/2017 11/05/2016 09/01/2015 08/31/2012  Falls in the past year? No Yes Yes No  Number falls in past yr: - 2 or more 1 -  Injury with Fall? - Yes No -  Risk for fall due to : - - Impaired balance/gait -   Functional Status Survey:    Vitals:   06/20/18 1047  BP: 120/64  Pulse: 78  Resp: 20  Temp: 98.5 F (36.9 C)  SpO2: 94%  Weight: 137 lb (62.1 kg)   Body mass index is 25.06 kg/m. Physical Exam Vitals signs reviewed.  Constitutional:      Appearance: Normal  appearance. She is normal weight.  HENT:     Head: Normocephalic.     Nose: Nose normal.     Mouth/Throat:     Mouth: Mucous membranes are moist.  Eyes:     Pupils: Pupils are equal, round, and reactive to light.  Neck:     Musculoskeletal: Neck supple.  Cardiovascular:     Rate and Rhythm: Normal rate and regular rhythm.     Pulses: Normal pulses.     Heart sounds: Normal heart sounds.  Pulmonary:     Effort: Pulmonary effort is normal. No respiratory distress.     Breath sounds: Normal  breath sounds. No wheezing or rales.  Abdominal:     General: Abdomen is flat. Bowel sounds are normal. There is no distension.     Palpations: Abdomen is soft.     Tenderness: There is no abdominal tenderness. There is no guarding.  Musculoskeletal:        General: No swelling.  Skin:    General: Skin is warm and dry.  Neurological:     General: No focal deficit present.     Mental Status: She is alert and oriented to person, place, and time.  Psychiatric:        Mood and Affect: Mood normal.        Thought Content: Thought content normal.     Comments: Patient has very Good Insight but she is Adamant that she has bugs crawling on her Skin     Labs reviewed: Recent Labs    09/13/17 11/08/17 11/22/17 02/10/18  NA 143  --  142 143  K 4.1 4.3 4.3 4.8  CL 108 106 107  --   CO2 27 28 28   --   BUN 18 17 13 16   CREATININE 0.8 0.8 0.8 0.8  CALCIUM 9.9 9.1 9.4  --    Recent Labs    07/26/17 09/13/17 11/08/17 02/10/18  AST 16 18 18 18   ALT 10 12 11 12   ALKPHOS 101 113 94 109  PROT  --  6.5 5.2  --   ALBUMIN 3.8 4.3 3.7  --    Recent Labs    02/09/18 02/10/18 02/24/18 03/30/18  WBC  --  6.9 9.0 5.5  HGB  --  11.5* 11.7* 12.1  HCT 34*  --  35* 36  PLT  --  239 254 237   Lab Results  Component Value Date   TSH 2.35 09/13/2017   Lab Results  Component Value Date   HGBA1C 5.8 02/17/2017   Lab Results  Component Value Date   CHOL 197 02/17/2017   HDL 59 02/17/2017   LDLCALC  111 02/17/2017   LDLDIRECT 114.7 08/18/2011   TRIG 153 02/17/2017   CHOLHDL 2 01/23/2015    Significant Diagnostic Results in last 30 days:  No results found.  Assessment/Plan Tactile hallucinations Will restart her on Risperdal I think she also has underline depression worsen with Covid Will also Start her on Zoloft  Essential hypertension Stable on Lisinopril  COPD Refuses Inhalers  Fibromyalgia Continue on Lyrica GERD On Prilosec     Family/ staff Communication:   Labs/tests ordered:   Total time spent in this patient care encounter was  40_  minutes; greater than 50% of the visit spent counseling patient and staff, reviewing records , Labs and coordinating care for problems addressed at this encounter.

## 2018-06-26 ENCOUNTER — Encounter: Payer: Self-pay | Admitting: Nurse Practitioner

## 2018-06-26 ENCOUNTER — Non-Acute Institutional Stay: Payer: Medicare Other | Admitting: Nurse Practitioner

## 2018-06-26 DIAGNOSIS — M797 Fibromyalgia: Secondary | ICD-10-CM | POA: Diagnosis not present

## 2018-06-26 DIAGNOSIS — N952 Postmenopausal atrophic vaginitis: Secondary | ICD-10-CM | POA: Diagnosis not present

## 2018-06-26 DIAGNOSIS — N390 Urinary tract infection, site not specified: Secondary | ICD-10-CM

## 2018-06-26 DIAGNOSIS — I10 Essential (primary) hypertension: Secondary | ICD-10-CM | POA: Diagnosis not present

## 2018-06-26 LAB — HEPATIC FUNCTION PANEL
ALT: 10 (ref 7–35)
AST: 16 (ref 13–35)

## 2018-06-26 LAB — BASIC METABOLIC PANEL
BUN: 14 (ref 4–21)
Creatinine: 0.7 (ref 0.5–1.1)
Glucose: 98
Potassium: 4.7 (ref 3.4–5.3)
Sodium: 139 (ref 137–147)

## 2018-06-26 LAB — CBC AND DIFFERENTIAL
HCT: 36 (ref 36–46)
Hemoglobin: 12.2 (ref 12.0–16.0)
Platelets: 274 (ref 150–399)

## 2018-06-26 NOTE — Assessment & Plan Note (Signed)
Clinically presumed in setting of dysuria, urinary frequency/urgency/incontinence, last treated UTI with 7 day course of Nitrofurantoin 110m bid since 06/01/18. Will start Pyridium 1054mtid x 2 days. Obtain UA C/S, CBC/diff, CMP/eGFR. Observe the patient

## 2018-06-26 NOTE — Assessment & Plan Note (Signed)
Stable, continue Lyrica 50mg  qd, Flexeril 2.5mg  qd, prn Naproxen 220mg  daily.

## 2018-06-26 NOTE — Progress Notes (Addendum)
Location:  [facility] Room Number: 917A Place of Service:  ALF [phone]) Provider:   X ,  NP   ,  X, NP  Patient Care Team: ,  X, NP as PCP - General (Internal Medicine) ,  X, NP as Nurse Practitioner (Internal Medicine)  Extended Emergency Contact Information Primary Emergency Contact: Culbreth,Robin Address: [address] [place], [place] [address] [place] Phone: [phone] Mobile Phone: [phone] Relation: Daughter  Code Status:  FULL Goals of care: Advanced Directive information Advanced Directives 06/26/2018  Does Patient Have a Medical Advance Directive? Yes  Type of Advance Directive Living will  Does patient want to make changes to medical advance directive? No - Patient declined  Copy of [place] in Chart? -  Would patient like information on creating a medical advance directive? -  Pre-existing out of facility DNR order (yellow form or pink MOST form) -     Chief Complaint  Patient presents with  . Acute Visit    Dysuria     HPI:  Pt is a 85 y.o. female seen today for an acute visit for pain on urination about 10 days, she stated has lower abd/back pain, increased urinary urgency/freuqncy/incontinence. She is afebrile, denied nausea, vomiting, or constipation. Hx of recurrent UTI, last treated UTI about 3 weeks ago. She has chronic back pain, controlled on Lyrica 86m qd, Fleseril 2.51mqd, prn Naproxen 22065md.    Past Medical History:  Diagnosis Date  . Acute blood loss anemia 08/23/2013   04/13/16 TSH 1.20, Na 141, K 4.2, Bun 15, creat 0.79, wbc 5.5, Hgb 11.5, plt 283  . Anxiety   . Anxiety state 07/02/2007   Qualifier: Diagnosis of  By: NadLenna Gilford, ScoDeborra Medina. 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 Cigarette smoker   . Colon polyps 2009   TWO TUBULAR ADENOMAS AND HYPERPLASTIC POLYPS  . Constipation 09/14/2013  . COPD (chronic obstructive  pulmonary disease) ([facility] . Diverticulosis of colon   . DJD (degenerative joint disease)   . Dog bite of limb 07/14/2010   Dog bite 07/10/10 - dogs shots utd Last td was 08/2009  Localized tx only.    . Fibromyalgia   . GERD 12/19/2006   Qualifier: Diagnosis of  By: AdkJulien GirtA, LeiMarliss Czar/27/18 TSH 1.20, Na 141, K 4.2, Bun 15, creat 0.79, wbc 5.5, Hgb 11.5, plt 283   . GERD (gastroesophageal reflux disease)   . Hammer toe of second toe of left foot 04/08/2016   Left 2nd  . Hearing loss   . Hemorrhoids   . Hypercholesterolemia   . Osteoarthritis 12/19/2006   Qualifier: Diagnosis of  By: AdkJulien GirtA, LeiMarliss Czar . Osteoporosis    Past Surgical History:  Procedure Laterality Date  . CATARACT EXTRACTION, BILATERAL  2009  . COLONOSCOPY  2009, 2006 , 2017  . stapes surgery     Dr KraThornell Mule TONSILLECTOMY AND ADENOIDECTOMY     as a child    Allergies  Allergen Reactions  . Penicillins Anaphylaxis  . Bisphosphonates Other (See Comments)    [name]  . Influenza Vaccines     Pt reported  . Simvastatin     Unknown   . Trazodone And Nefazodone     Felt her throat was closing up/kept her awake  . Sulfonamide Derivatives Rash and Other (See Comments)  blisters    Outpatient Encounter Medications as of 06/26/2018  Medication Sig  . calcium carbonate (TUMS EX) 750 MG chewable tablet Chew 750 mg by mouth daily.   . Calcium Carbonate-Vit D-Min (CALCIUM 600+D PLUS MINERALS) 600-400 MG-UNIT TABS Take 1 tablet by mouth daily.   . cholecalciferol (VITAMIN D) 1000 UNITS tablet Take 1,000 Units by mouth daily.  . cyclobenzaprine (FLEXERIL) 5 MG tablet Take 2.5 mg by mouth at bedtime.  . hydrocortisone cream 1 % Apply 1 application topically 2 (two) times daily as needed. Apply to wrists  . lisinopril (PRINIVIL,ZESTRIL) 2.5 MG tablet Take 2.5 mg by mouth daily.  . naproxen sodium (ALEVE) 220 MG tablet Take 220 mg by mouth daily as needed. Take 1 tablet at dinner  . omeprazole (PRILOSEC) 20 MG  capsule Take 20 mg by mouth 2 (two) times daily.   . polyethylene glycol (MIRALAX / GLYCOLAX) packet Take 17 g by mouth daily as needed.   . pregabalin (LYRICA) 50 MG capsule Take 1 capsule (50 mg total) by mouth daily.  . risperiDONE (RISPERDAL) 0.25 MG tablet Take 0.25 mg by mouth at bedtime.  . sertraline (ZOLOFT) 25 MG tablet Take 25 mg by mouth daily.   No facility-administered encounter medications on file as of 06/26/2018.     Review of Systems  Constitutional: Positive for appetite change and fatigue. Negative for activity change, chills, diaphoresis and fever.  HENT: Positive for hearing loss. Negative for congestion and voice change.   Respiratory: Negative for cough, shortness of breath and wheezing.   Cardiovascular: Negative for palpitations and leg swelling.  Gastrointestinal: Positive for abdominal pain. Negative for abdominal distention, constipation, diarrhea, nausea and vomiting.  Genitourinary: Positive for dysuria, frequency and urgency. Negative for difficulty urinating.       Increased incontinent of urine.   Musculoskeletal: Positive for arthralgias and gait problem.       CVA tenderness.   Skin: Negative for color change and pallor.  Neurological: Negative for dizziness, speech difficulty, weakness and headaches.       Memory lapses.   Psychiatric/Behavioral: Positive for dysphoric mood. Negative for agitation, behavioral problems and sleep disturbance. The patient is not nervous/anxious.     Immunization History  Administered Date(s) Administered  . Pneumococcal Conjugate-13 09/01/2015  . Pneumococcal Polysaccharide-23 07/07/1998  . Td 08/21/2009   Pertinent  Health Maintenance Due  Topic Date Due  . PNA vac Low Risk Adult (2 of 2 - PPSV23) 08/31/2016  . DEXA SCAN  Completed  . INFLUENZA VACCINE  Discontinued   Fall Risk  11/08/2017 11/05/2016 09/01/2015 08/31/2012  Falls in the past year? No Yes Yes No  Number falls in past yr: - 2 or more 1 -  Injury with  Fall? - Yes No -  Risk for fall due to : - - Impaired balance/gait -   Functional Status Survey:    Vitals:   06/26/18 1548  BP: 126/80  Pulse: 80  Resp: 18  Temp: 98.7 F (37.1 C)  TempSrc: Oral  SpO2: 92%  Weight: 137 lb (62.1 kg)  Height: 5' 2"  (1.575 m)   Body mass index is 25.06 kg/m. Physical Exam Vitals signs and nursing note reviewed.  Constitutional:      General: She is not in acute distress.    Appearance: Normal appearance. She is normal weight. She is not ill-appearing, toxic-appearing or diaphoretic.  HENT:     Head: Normocephalic and atraumatic.     Nose: Nose normal. No congestion or  rhinorrhea.     Mouth/Throat:     Mouth: Mucous membranes are moist.  Eyes:     Extraocular Movements: Extraocular movements intact.     Conjunctiva/sclera: Conjunctivae normal.     Pupils: Pupils are equal, round, and reactive to light.  Neck:     Musculoskeletal: Normal range of motion and neck supple.  Cardiovascular:     Rate and Rhythm: Normal rate and regular rhythm.     Heart sounds: No murmur.  Pulmonary:     Effort: Pulmonary effort is normal.     Breath sounds: No wheezing, rhonchi or rales.  Abdominal:     General: There is no distension.     Palpations: Abdomen is soft.     Tenderness: There is abdominal tenderness. There is right CVA tenderness and left CVA tenderness. There is no guarding or rebound.     Hernia: No hernia is present.     Comments: Suprapubic and CVA discomfort/tenderness.   Musculoskeletal:     Right lower leg: No edema.     Left lower leg: No edema.     Comments: Furniture/walker walking sometimes.   Skin:    General: Skin is warm and dry.  Neurological:     General: No focal deficit present.     Mental Status: She is alert. Mental status is at baseline.     Cranial Nerves: No cranial nerve deficit.     Motor: No weakness.     Coordination: Coordination normal.     Gait: Gait abnormal.     Comments: Oriented to person and place.       Labs reviewed: Recent Labs    09/13/17 11/08/17 11/22/17 02/10/18  NA 143  --  142 143  K 4.1 4.3 4.3 4.8  CL 108 106 107  --   CO2 27 28 28   --   BUN 18 17 13 16   CREATININE 0.8 0.8 0.8 0.8  CALCIUM 9.9 9.1 9.4  --    Recent Labs    07/26/17 09/13/17 11/08/17 02/10/18  AST 16 18 18 18   ALT 10 12 11 12   ALKPHOS 101 113 94 109  PROT  --  6.5 5.2  --   ALBUMIN 3.8 4.3 3.7  --    Recent Labs    02/09/18 02/10/18 02/24/18 03/30/18  WBC  --  6.9 9.0 5.5  HGB  --  11.5* 11.7* 12.1  HCT 34*  --  35* 36  PLT  --  239 254 237   Lab Results  Component Value Date   TSH 2.35 09/13/2017   Lab Results  Component Value Date   HGBA1C 5.8 02/17/2017   Lab Results  Component Value Date   CHOL 197 02/17/2017   HDL 59 02/17/2017   LDLCALC 111 02/17/2017   LDLDIRECT 114.7 08/18/2011   TRIG 153 02/17/2017   CHOLHDL 2 01/23/2015    Significant Diagnostic Results in last 30 days:  No results found.  Assessment/Plan UTI (urinary tract infection) Clinically presumed in setting of dysuria, urinary frequency/urgency/incontinence, last treated UTI with 7 day course of Nitrofurantoin 197m bid since 06/01/18. Will start Pyridium 1028mtid x 2 days. Obtain UA C/S, CBC/diff, CMP/eGFR. Observe the patient   Fibromyalgia Stable, continue Lyrica 5039md, Flexeril 2.5mg28m, prn Naproxen 220mg60mly.   Senile (atrophic) vaginitis 06/26/18 urine culture showed 10,000-50,000c/mo of E Coli in setting of symptomatic UTI which could be irritation from atrophic vaginitis. Will try Estrace vaginal cream 3x/week, monitor for s/s of  UTI.   '   Family/ staff Communication: plan of care reviewed with the patient and charge nurse.   Labs/tests ordered:  UA C/S, CBC/diff, CMP/eGFR  Time spend 40 minutes

## 2018-06-28 DIAGNOSIS — N952 Postmenopausal atrophic vaginitis: Secondary | ICD-10-CM | POA: Insufficient documentation

## 2018-06-28 NOTE — Assessment & Plan Note (Signed)
06/26/18 urine culture showed 10,000-50,000c/mo of E Coli in setting of symptomatic UTI which could be irritation from atrophic vaginitis. Will try Estrace vaginal cream 3x/week, monitor for s/s of UTI.

## 2018-07-11 DIAGNOSIS — M810 Age-related osteoporosis without current pathological fracture: Secondary | ICD-10-CM | POA: Diagnosis not present

## 2018-07-11 DIAGNOSIS — F419 Anxiety disorder, unspecified: Secondary | ICD-10-CM | POA: Diagnosis not present

## 2018-07-11 DIAGNOSIS — K219 Gastro-esophageal reflux disease without esophagitis: Secondary | ICD-10-CM | POA: Diagnosis not present

## 2018-07-11 DIAGNOSIS — D649 Anemia, unspecified: Secondary | ICD-10-CM | POA: Diagnosis not present

## 2018-07-11 DIAGNOSIS — R41841 Cognitive communication deficit: Secondary | ICD-10-CM | POA: Diagnosis not present

## 2018-07-11 DIAGNOSIS — J449 Chronic obstructive pulmonary disease, unspecified: Secondary | ICD-10-CM | POA: Diagnosis not present

## 2018-07-11 DIAGNOSIS — E7800 Pure hypercholesterolemia, unspecified: Secondary | ICD-10-CM | POA: Diagnosis not present

## 2018-07-11 DIAGNOSIS — F5105 Insomnia due to other mental disorder: Secondary | ICD-10-CM | POA: Diagnosis not present

## 2018-07-11 DIAGNOSIS — R2681 Unsteadiness on feet: Secondary | ICD-10-CM | POA: Diagnosis not present

## 2018-07-11 DIAGNOSIS — R443 Hallucinations, unspecified: Secondary | ICD-10-CM | POA: Diagnosis not present

## 2018-07-11 DIAGNOSIS — K5900 Constipation, unspecified: Secondary | ICD-10-CM | POA: Diagnosis not present

## 2018-07-11 DIAGNOSIS — F411 Generalized anxiety disorder: Secondary | ICD-10-CM | POA: Diagnosis not present

## 2018-07-11 DIAGNOSIS — H9193 Unspecified hearing loss, bilateral: Secondary | ICD-10-CM | POA: Diagnosis not present

## 2018-07-11 DIAGNOSIS — R1312 Dysphagia, oropharyngeal phase: Secondary | ICD-10-CM | POA: Diagnosis not present

## 2018-07-11 DIAGNOSIS — Z9181 History of falling: Secondary | ICD-10-CM | POA: Diagnosis not present

## 2018-07-11 DIAGNOSIS — M6281 Muscle weakness (generalized): Secondary | ICD-10-CM | POA: Diagnosis not present

## 2018-07-11 DIAGNOSIS — M797 Fibromyalgia: Secondary | ICD-10-CM | POA: Diagnosis not present

## 2018-07-12 ENCOUNTER — Other Ambulatory Visit: Payer: Self-pay | Admitting: *Deleted

## 2018-07-12 DIAGNOSIS — M797 Fibromyalgia: Secondary | ICD-10-CM | POA: Diagnosis not present

## 2018-07-12 DIAGNOSIS — F419 Anxiety disorder, unspecified: Secondary | ICD-10-CM | POA: Diagnosis not present

## 2018-07-12 DIAGNOSIS — F411 Generalized anxiety disorder: Secondary | ICD-10-CM | POA: Diagnosis not present

## 2018-07-12 DIAGNOSIS — D649 Anemia, unspecified: Secondary | ICD-10-CM | POA: Diagnosis not present

## 2018-07-12 DIAGNOSIS — J449 Chronic obstructive pulmonary disease, unspecified: Secondary | ICD-10-CM | POA: Diagnosis not present

## 2018-07-12 DIAGNOSIS — F5105 Insomnia due to other mental disorder: Secondary | ICD-10-CM | POA: Diagnosis not present

## 2018-07-12 MED ORDER — PREGABALIN 50 MG PO CAPS: 50.0000 mg | ORAL_CAPSULE | Freq: Every day | ORAL | 0 refills | Status: DC

## 2018-07-12 NOTE — Telephone Encounter (Signed)
Refill Request from [facility] and sent to [facility] for approval.

## 2018-07-14 DIAGNOSIS — F419 Anxiety disorder, unspecified: Secondary | ICD-10-CM | POA: Diagnosis not present

## 2018-07-14 DIAGNOSIS — J449 Chronic obstructive pulmonary disease, unspecified: Secondary | ICD-10-CM | POA: Diagnosis not present

## 2018-07-14 DIAGNOSIS — F411 Generalized anxiety disorder: Secondary | ICD-10-CM | POA: Diagnosis not present

## 2018-07-14 DIAGNOSIS — M797 Fibromyalgia: Secondary | ICD-10-CM | POA: Diagnosis not present

## 2018-07-14 DIAGNOSIS — D649 Anemia, unspecified: Secondary | ICD-10-CM | POA: Diagnosis not present

## 2018-07-14 DIAGNOSIS — F5105 Insomnia due to other mental disorder: Secondary | ICD-10-CM | POA: Diagnosis not present

## 2018-08-16 DIAGNOSIS — N3941 Urge incontinence: Secondary | ICD-10-CM | POA: Diagnosis not present

## 2018-08-16 DIAGNOSIS — J449 Chronic obstructive pulmonary disease, unspecified: Secondary | ICD-10-CM | POA: Diagnosis not present

## 2018-08-16 DIAGNOSIS — Z9181 History of falling: Secondary | ICD-10-CM | POA: Diagnosis not present

## 2018-08-16 DIAGNOSIS — R443 Hallucinations, unspecified: Secondary | ICD-10-CM | POA: Diagnosis not present

## 2018-08-16 DIAGNOSIS — H9193 Unspecified hearing loss, bilateral: Secondary | ICD-10-CM | POA: Diagnosis not present

## 2018-08-16 DIAGNOSIS — R41841 Cognitive communication deficit: Secondary | ICD-10-CM | POA: Diagnosis not present

## 2018-08-16 DIAGNOSIS — K5900 Constipation, unspecified: Secondary | ICD-10-CM | POA: Diagnosis not present

## 2018-08-16 DIAGNOSIS — M542 Cervicalgia: Secondary | ICD-10-CM | POA: Diagnosis not present

## 2018-08-16 DIAGNOSIS — R2681 Unsteadiness on feet: Secondary | ICD-10-CM | POA: Diagnosis not present

## 2018-08-16 DIAGNOSIS — M6281 Muscle weakness (generalized): Secondary | ICD-10-CM | POA: Diagnosis not present

## 2018-08-16 DIAGNOSIS — M810 Age-related osteoporosis without current pathological fracture: Secondary | ICD-10-CM | POA: Diagnosis not present

## 2018-08-16 DIAGNOSIS — K219 Gastro-esophageal reflux disease without esophagitis: Secondary | ICD-10-CM | POA: Diagnosis not present

## 2018-08-17 ENCOUNTER — Other Ambulatory Visit: Payer: Self-pay | Admitting: *Deleted

## 2018-08-17 DIAGNOSIS — M542 Cervicalgia: Secondary | ICD-10-CM | POA: Diagnosis not present

## 2018-08-17 DIAGNOSIS — N3941 Urge incontinence: Secondary | ICD-10-CM | POA: Diagnosis not present

## 2018-08-17 DIAGNOSIS — Z9181 History of falling: Secondary | ICD-10-CM | POA: Diagnosis not present

## 2018-08-17 DIAGNOSIS — J449 Chronic obstructive pulmonary disease, unspecified: Secondary | ICD-10-CM | POA: Diagnosis not present

## 2018-08-17 DIAGNOSIS — R2681 Unsteadiness on feet: Secondary | ICD-10-CM | POA: Diagnosis not present

## 2018-08-17 DIAGNOSIS — R3914 Feeling of incomplete bladder emptying: Secondary | ICD-10-CM | POA: Diagnosis not present

## 2018-08-17 DIAGNOSIS — M6281 Muscle weakness (generalized): Secondary | ICD-10-CM | POA: Diagnosis not present

## 2018-08-17 DIAGNOSIS — N3020 Other chronic cystitis without hematuria: Secondary | ICD-10-CM | POA: Diagnosis not present

## 2018-08-17 MED ORDER — PREGABALIN 50 MG PO CAPS: 50.0000 mg | ORAL_CAPSULE | Freq: Every day | ORAL | 0 refills | Status: DC

## 2018-08-25 DIAGNOSIS — J449 Chronic obstructive pulmonary disease, unspecified: Secondary | ICD-10-CM | POA: Diagnosis not present

## 2018-08-25 DIAGNOSIS — Z9181 History of falling: Secondary | ICD-10-CM | POA: Diagnosis not present

## 2018-08-25 DIAGNOSIS — M6281 Muscle weakness (generalized): Secondary | ICD-10-CM | POA: Diagnosis not present

## 2018-08-25 DIAGNOSIS — M542 Cervicalgia: Secondary | ICD-10-CM | POA: Diagnosis not present

## 2018-08-25 DIAGNOSIS — N3941 Urge incontinence: Secondary | ICD-10-CM | POA: Diagnosis not present

## 2018-08-25 DIAGNOSIS — R2681 Unsteadiness on feet: Secondary | ICD-10-CM | POA: Diagnosis not present

## 2018-08-28 DIAGNOSIS — M6281 Muscle weakness (generalized): Secondary | ICD-10-CM | POA: Diagnosis not present

## 2018-08-28 DIAGNOSIS — J449 Chronic obstructive pulmonary disease, unspecified: Secondary | ICD-10-CM | POA: Diagnosis not present

## 2018-08-28 DIAGNOSIS — R2681 Unsteadiness on feet: Secondary | ICD-10-CM | POA: Diagnosis not present

## 2018-08-28 DIAGNOSIS — N3941 Urge incontinence: Secondary | ICD-10-CM | POA: Diagnosis not present

## 2018-08-28 DIAGNOSIS — Z9181 History of falling: Secondary | ICD-10-CM | POA: Diagnosis not present

## 2018-08-28 DIAGNOSIS — M542 Cervicalgia: Secondary | ICD-10-CM | POA: Diagnosis not present

## 2018-09-12 ENCOUNTER — Non-Acute Institutional Stay: Payer: Medicare Other | Admitting: Nurse Practitioner

## 2018-09-12 ENCOUNTER — Encounter: Payer: Self-pay | Admitting: Nurse Practitioner

## 2018-09-12 DIAGNOSIS — K219 Gastro-esophageal reflux disease without esophagitis: Secondary | ICD-10-CM

## 2018-09-12 DIAGNOSIS — R300 Dysuria: Secondary | ICD-10-CM | POA: Diagnosis not present

## 2018-09-12 DIAGNOSIS — I10 Essential (primary) hypertension: Secondary | ICD-10-CM | POA: Diagnosis not present

## 2018-09-12 DIAGNOSIS — F22 Delusional disorders: Secondary | ICD-10-CM | POA: Diagnosis not present

## 2018-09-12 DIAGNOSIS — M797 Fibromyalgia: Secondary | ICD-10-CM | POA: Diagnosis not present

## 2018-09-12 NOTE — Assessment & Plan Note (Signed)
Will start Seroquel 12.5mg  qhs to stabilize mood and aid sleep. Observe.

## 2018-09-12 NOTE — Assessment & Plan Note (Signed)
Blood pressure is controlled, continue Losartan 12.5mg  qd.

## 2018-09-12 NOTE — Progress Notes (Signed)
Location:   [facility] Room Number: 962 Place of Service:  ALF (13) Provider: Lennie Odor Alieyah Spader NP  Edna Grover X, NP  Patient Care Team: Lucerito Rosinski X, NP as PCP - General (Internal Medicine) Verdia Bolt X, NP as Nurse Practitioner (Internal Medicine)  Extended Emergency Contact Information Primary Emergency Contact: Culbreth,Robin Address: [address] [place], [place] [address] [place] Phone: [phone] Mobile Phone: [phone] Relation: Daughter  Code Status:  DNR Goals of care: Advanced Directive information Advanced Directives 09/12/2018  Does Patient Have a Medical Advance Directive? Yes  Type of Advance Directive Living will  Does patient want to make changes to medical advance directive? No - Patient declined  Copy of [place] in Chart? -  Would patient like information on creating a medical advance directive? -  Pre-existing out of facility DNR order (yellow form or pink MOST form) -     Chief Complaint  Patient presents with  . Medical Management of Chronic Issues    Routine visit   . Health Maintenance    [id number]    HPI:  Pt is a 85 y.o. female seen today for medical management of chronic diseases.    The patient has chronic dysuria, underwent Urology evaluation, on Estrace daily PV, Nitrofurantoin 50mg  qd for UTI suppression therapy. The patient stated it has no efficacy at all, she believes there are parasites in her abdomen causing pain from her stomach to bladder to urethra, no pain palpated upon my examination. Her bathroom trips 2-3x/night. She stated she has trouble falling and staying asleep at night. Hx of  Delusion of parasitosis, refused Risperidone, Sertraline. Hx of neck pain, controlled on Lyrica 50mg  qd, Flexeril 2.5mg  qhs. GERD stable on TUMS 750mg  qd,  Omeprazole 20mg  bid. HTN, blood pressure is controlled on Losartan 12.5mg  qd.    Past Medical History:  Diagnosis Date  . Acute blood  loss anemia 08/23/2013   04/13/16 TSH 1.20, Na 141, K 4.2, Bun 15, creat 0.79, wbc 5.5, Hgb 11.5, plt 283  . Anxiety   . Anxiety state 07/02/2007   Qualifier: Diagnosis of  By: Lenna Gilford MD, Deborra Medina   . 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 Cigarette smoker   . Colon polyps 2009   TWO TUBULAR ADENOMAS AND HYPERPLASTIC POLYPS  . Constipation 09/14/2013  . COPD (chronic obstructive pulmonary disease) ([facility])   . Diverticulosis of colon   . DJD (degenerative joint disease)   . Dog bite of limb 07/14/2010   Dog bite 07/10/10 - dogs shots utd Last td was 08/2009  Localized tx only.    . Fibromyalgia   . GERD 12/19/2006   Qualifier: Diagnosis of  By: Julien Girt CMA, Marliss Czar  04/13/16 TSH 1.20, Na 141, K 4.2, Bun 15, creat 0.79, wbc 5.5, Hgb 11.5, plt 283   . GERD (gastroesophageal reflux disease)   . Hammer toe of second toe of left foot 04/08/2016   Left 2nd  . Hearing loss   . Hemorrhoids   . Hypercholesterolemia   . Osteoarthritis 12/19/2006   Qualifier: Diagnosis of  By: Julien Girt CMA, Marliss Czar    . Osteoporosis    Past Surgical History:  Procedure Laterality Date  . CATARACT EXTRACTION, BILATERAL  2009  . COLONOSCOPY  2009, 2006 , 2017  . stapes surgery     Dr Thornell Mule  . TONSILLECTOMY AND ADENOIDECTOMY     as a child  Allergies  Allergen Reactions  . Penicillins Anaphylaxis  . Bisphosphonates Other (See Comments)    [name]  . Influenza Vaccines     Pt reported  . Simvastatin     Unknown   . Trazodone And Nefazodone     Felt her throat was closing up/kept her awake  . Sulfonamide Derivatives Rash and Other (See Comments)    blisters     ROS was provided with assistance of staff.  Review of Systems  Constitutional: Positive for unexpected weight change. Negative for activity change, appetite change, chills, diaphoresis, fatigue and fever.       #6 Ibs weight loss in 3 months, f/u dietary.   HENT: Positive for hearing loss. Negative for congestion and voice change.    Respiratory: Negative for cough, shortness of breath and wheezing.   Cardiovascular: Negative for chest pain, palpitations and leg swelling.  Gastrointestinal: Positive for abdominal pain. Negative for abdominal distention, constipation, diarrhea, nausea and vomiting.       From stomach to lower abd/bladder pain with urination  Genitourinary: Positive for dysuria and frequency. Negative for difficulty urinating, pelvic pain, urgency, vaginal discharge and vaginal pain.  Musculoskeletal: Positive for arthralgias, gait problem, myalgias and neck pain.  Skin: Negative for color change and pallor.  Neurological: Negative for dizziness, speech difficulty, weakness and headaches.       Memory lapses.   Psychiatric/Behavioral: Positive for dysphoric mood and sleep disturbance. Negative for agitation, behavioral problems and hallucinations. The patient is not nervous/anxious.        Delusion of parasites.     Immunization History  Administered Date(s) Administered  . Pneumococcal Conjugate-13 09/01/2015  . Pneumococcal Polysaccharide-23 07/07/1998  . Td 08/21/2009   Pertinent  Health Maintenance Due  Topic Date Due  . PNA vac Low Risk Adult (2 of 2 - PPSV23) 08/31/2016  . DEXA SCAN  Completed  . INFLUENZA VACCINE  Discontinued   Fall Risk  11/08/2017 11/05/2016 09/01/2015 08/31/2012  Falls in the past year? No Yes Yes No  Number falls in past yr: - 2 or more 1 -  Injury with Fall? - Yes No -  Risk for fall due to : - - Impaired balance/gait -   Functional Status Survey:    Vitals:   09/12/18 0903  BP: 128/70  Pulse: 74  Resp: 18  Temp: 97.7 F (36.5 C)  SpO2: 98%  Weight: 131 lb 6.4 oz (59.6 kg)   Body mass index is 24.03 kg/m. Physical Exam Vitals signs and nursing note reviewed.  Constitutional:      General: She is not in acute distress.    Appearance: Normal appearance. She is normal weight. She is not ill-appearing, toxic-appearing or diaphoretic.  HENT:     Head:  Normocephalic and atraumatic.     Nose: Nose normal.     Mouth/Throat:     Mouth: Mucous membranes are moist.  Eyes:     Extraocular Movements: Extraocular movements intact.     Conjunctiva/sclera: Conjunctivae normal.     Pupils: Pupils are equal, round, and reactive to light.  Neck:     Musculoskeletal: Normal range of motion and neck supple. No neck rigidity or muscular tenderness.  Cardiovascular:     Rate and Rhythm: Normal rate and regular rhythm.     Heart sounds: No murmur.  Pulmonary:     Effort: Pulmonary effort is normal.     Breath sounds: No wheezing, rhonchi or rales.  Abdominal:  General: Bowel sounds are normal. There is no distension.     Palpations: Abdomen is soft.     Tenderness: There is no abdominal tenderness. There is no right CVA tenderness, left CVA tenderness, guarding or rebound.  Musculoskeletal:     Right lower leg: No edema.     Left lower leg: No edema.     Comments: Ambulates with walker.   Lymphadenopathy:     Cervical: No cervical adenopathy.  Skin:    General: Skin is warm and dry.  Neurological:     General: No focal deficit present.     Mental Status: She is alert. Mental status is at baseline.     Comments: Oriented to person and place.   Psychiatric:        Mood and Affect: Mood normal.        Behavior: Behavior normal.     Labs reviewed: Recent Labs    11/08/17 11/22/17 02/10/18 06/26/18  NA  --  142 143 139  K 4.3 4.3 4.8 4.7  CL 106 107  --   --   CO2 28 28  --   --   BUN 17 13 16 14   CREATININE 0.8 0.8 0.8 0.7  CALCIUM 9.1 9.4  --   --    Recent Labs    11/08/17 02/10/18 06/26/18  AST 18 18 16   ALT 11 12 10   ALKPHOS 94 109  --   PROT 5.2  --   --   ALBUMIN 3.7  --   --    Recent Labs    02/10/18 02/24/18 03/30/18 06/26/18  WBC 6.9 9.0 5.5  --   HGB 11.5* 11.7* 12.1 12.2  HCT  --  35* 36 36  PLT 239 254 237 274   Lab Results  Component Value Date   TSH 2.35 09/13/2017   Lab Results  Component Value Date    HGBA1C 5.8 02/17/2017   Lab Results  Component Value Date   CHOL 197 02/17/2017   HDL 59 02/17/2017   LDLCALC 111 02/17/2017   LDLDIRECT 114.7 08/18/2011   TRIG 153 02/17/2017   CHOLHDL 2 01/23/2015    Significant Diagnostic Results in last 30 days:  No results found.  Assessment/Plan  Delusions of parasitosis ([place]) Will start Seroquel 12.5mg  qhs to stabilize mood and aid sleep. Observe.   Dysuria Continue Nitrofurantoin, Estrace PV, f/u Urology, monitor for s/s of UTI.   Hypertension Blood pressure is controlled, continue Losartan 12.5mg  qd.   GERD Stable, continue Omeprazole 20mg  bid, TUMS 750mg  qd  Fibromyalgia C/o neck pain in the past, controlled, continue Lyrica 50mg  qd, Flexeril 2.5mg  qd.    Family/ staff Communication: plan of care reviewed with the patient and charge nurse.   Labs/tests ordered: none  Time spend 40 minutes.

## 2018-09-12 NOTE — Assessment & Plan Note (Signed)
C/o neck pain in the past, controlled, continue Lyrica 50mg  qd, Flexeril 2.5mg  qd.

## 2018-09-12 NOTE — Assessment & Plan Note (Signed)
Continue Nitrofurantoin, Estrace PV, f/u Urology, monitor for s/s of UTI.

## 2018-09-12 NOTE — Assessment & Plan Note (Addendum)
Stable, continue Omeprazole 20mg  bid, TUMS 750mg  qd

## 2018-09-15 ENCOUNTER — Other Ambulatory Visit: Payer: Self-pay

## 2018-09-15 MED ORDER — PREGABALIN 50 MG PO CAPS: 50.0000 mg | ORAL_CAPSULE | Freq: Every day | ORAL | 0 refills | Status: DC

## 2018-09-26 DIAGNOSIS — N3020 Other chronic cystitis without hematuria: Secondary | ICD-10-CM | POA: Diagnosis not present

## 2018-09-26 DIAGNOSIS — R3914 Feeling of incomplete bladder emptying: Secondary | ICD-10-CM | POA: Diagnosis not present

## 2018-09-26 DIAGNOSIS — R300 Dysuria: Secondary | ICD-10-CM | POA: Diagnosis not present

## 2018-10-10 DIAGNOSIS — R3914 Feeling of incomplete bladder emptying: Secondary | ICD-10-CM | POA: Diagnosis not present

## 2018-10-10 DIAGNOSIS — R300 Dysuria: Secondary | ICD-10-CM | POA: Diagnosis not present

## 2018-10-17 ENCOUNTER — Other Ambulatory Visit: Payer: Self-pay | Admitting: *Deleted

## 2018-10-17 MED ORDER — PREGABALIN 50 MG PO CAPS: 50.0000 mg | ORAL_CAPSULE | Freq: Every day | ORAL | 0 refills | Status: DC

## 2018-10-25 ENCOUNTER — Non-Acute Institutional Stay: Payer: Medicare Other | Admitting: Nurse Practitioner

## 2018-10-25 ENCOUNTER — Encounter: Payer: Self-pay | Admitting: Nurse Practitioner

## 2018-10-25 DIAGNOSIS — I10 Essential (primary) hypertension: Secondary | ICD-10-CM | POA: Diagnosis not present

## 2018-10-25 DIAGNOSIS — K219 Gastro-esophageal reflux disease without esophagitis: Secondary | ICD-10-CM

## 2018-10-25 DIAGNOSIS — R05 Cough: Secondary | ICD-10-CM | POA: Diagnosis not present

## 2018-10-25 DIAGNOSIS — R059 Cough, unspecified: Secondary | ICD-10-CM | POA: Insufficient documentation

## 2018-10-25 NOTE — Progress Notes (Signed)
Location:  [place] Room Number: 546 Place of Service:  SNF (31) Provider:  Marlana Latus  NP  Cru Kritikos X, NP  Patient Care Team: Sema Stangler X, NP as PCP - General (Internal Medicine) Shalaunda Weatherholtz X, NP as Nurse Practitioner (Internal Medicine)  Extended Emergency Contact Information Primary Emergency Contact: Culbreth,Robin Address: [address] [place], [place] [address] [place] Phone: [phone] Mobile Phone: [phone] Relation: Daughter  Code Status:  Full Code Goals of care: Advanced Directive information Advanced Directives 10/25/2018  Does Patient Have a Medical Advance Directive? Yes  Type of Advance Directive Living will  Does patient want to make changes to medical advance directive? No - Patient declined  Copy of [place] in Chart? -  Would patient like information on creating a medical advance directive? -  Pre-existing out of facility DNR order (yellow form or pink MOST form) -     Chief Complaint  Patient presents with  . Acute Visit    C/o - cough    HPI:  Pt is a 85 y.o. female seen today for an acute visit for cough x 1 week, no phlegm, does not wake her up at night, no associated with eating/drinking, denied chest pain, chest pressure, or palpitation. Denied SOB. No O2 desaturation. She is afebrile. GERD, stable on Omeprazole 22m bid po. Desires to stop it. HTN, blood pressure is controlled, on Losartan 12.558mqd.    Past Medical History:  Diagnosis Date  . Acute blood loss anemia 08/23/2013   04/13/16 TSH 1.20, Na 141, K 4.2, Bun 15, creat 0.79, wbc 5.5, Hgb 11.5, plt 283  . Anxiety   . Anxiety state 07/02/2007   Qualifier: Diagnosis of  By: NaLenna GilfordD, ScDeborra Medina . 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 Cigarette smoker   . Colon polyps 2009   TWO TUBULAR ADENOMAS AND HYPERPLASTIC POLYPS  . Constipation 09/14/2013  . COPD (chronic obstructive pulmonary  disease) ([facility]  . Diverticulosis of colon   . DJD (degenerative joint disease)   . Dog bite of limb 07/14/2010   Dog bite 07/10/10 - dogs shots utd Last td was 08/2009  Localized tx only.    . Fibromyalgia   . GERD 12/19/2006   Qualifier: Diagnosis of  By: AdJulien GirtMA, LeMarliss Czar2/27/18 TSH 1.20, Na 141, K 4.2, Bun 15, creat 0.79, wbc 5.5, Hgb 11.5, plt 283   . GERD (gastroesophageal reflux disease)   . Hammer toe of second toe of left foot 04/08/2016   Left 2nd  . Hearing loss   . Hemorrhoids   . Hypercholesterolemia   . Osteoarthritis 12/19/2006   Qualifier: Diagnosis of  By: AdJulien GirtMA, LeMarliss Czar  . Osteoporosis    Past Surgical History:  Procedure Laterality Date  . CATARACT EXTRACTION, BILATERAL  2009  . COLONOSCOPY  2009, 2006 , 2017  . stapes surgery     Dr KrThornell Mule. TONSILLECTOMY AND ADENOIDECTOMY     as a child    Allergies  Allergen Reactions  . Penicillins Anaphylaxis  . Bisphosphonates Other (See Comments)    [name]  . Influenza Vaccines     Pt reported  . Simvastatin     Unknown   . Trazodone And Nefazodone     Felt her throat was closing up/kept her awake  . Sulfonamide Derivatives Rash and Other (See Comments)  blisters    Outpatient Encounter Medications as of 10/25/2018  Medication Sig  . calcium carbonate (TUMS EX) 750 MG chewable tablet Chew 750 mg by mouth daily.   . Calcium Carbonate-Vit D-Min (CALCIUM 600+D PLUS MINERALS) 600-400 MG-UNIT TABS Take 1 tablet by mouth daily.   . cholecalciferol (VITAMIN D) 1000 UNITS tablet Take 1,000 Units by mouth daily.  . cyclobenzaprine (FLEXERIL) 5 MG tablet Take 2.5 mg by mouth at bedtime.  Marland Kitchen estradiol (ESTRACE) 0.1 MG/GM vaginal cream Place 1 Applicatorful vaginally daily. On Mon,Wed,Fri  . hydrocortisone cream 1 % Apply 1 application topically 2 (two) times daily as needed. Apply to wrists  . losartan (COZAAR) 25 MG tablet Take 12.5 mg by mouth daily.  . naproxen sodium (ALEVE) 220 MG tablet Take 220 mg by  mouth daily as needed. Take 1 tablet at dinner  . nitrofurantoin (MACRODANTIN) 50 MG capsule Take 50 mg by mouth at bedtime.  Marland Kitchen omeprazole (PRILOSEC) 20 MG capsule Take 20 mg by mouth 2 (two) times daily.   . polyethylene glycol (MIRALAX / GLYCOLAX) packet Take 17 g by mouth daily as needed. Once a day on Sun,Wed  . pregabalin (LYRICA) 50 MG capsule Take 1 capsule (50 mg total) by mouth daily.  . QUEtiapine (SEROQUEL) 12.5 mg TABS tablet Take 12.5 mg by mouth daily.   No facility-administered encounter medications on file as of 10/25/2018.    ROS was provided with assistance of staff Review of Systems  Constitutional: Negative for activity change, appetite change, chills, diaphoresis, fatigue and fever.  HENT: Positive for hearing loss and rhinorrhea. Negative for congestion and voice change.   Eyes: Negative for visual disturbance.  Respiratory: Positive for cough. Negative for shortness of breath and wheezing.   Cardiovascular: Negative for chest pain, palpitations and leg swelling.  Gastrointestinal: Negative for abdominal distention, abdominal pain, constipation, diarrhea, nausea and vomiting.  Genitourinary: Negative for difficulty urinating, dysuria and urgency.  Musculoskeletal: Positive for gait problem.  Skin: Negative for color change and pallor.  Neurological: Negative for dizziness, speech difficulty, weakness and headaches.       Memory lapses.   Psychiatric/Behavioral: Positive for dysphoric mood. Negative for agitation, behavioral problems, hallucinations and sleep disturbance. The patient is not nervous/anxious.     Immunization History  Administered Date(s) Administered  . Pneumococcal Conjugate-13 09/01/2015  . Pneumococcal Polysaccharide-23 07/07/1998  . Td 08/21/2009   Pertinent  Health Maintenance Due  Topic Date Due  . PNA vac Low Risk Adult (2 of 2 - PPSV23) 08/31/2016  . DEXA SCAN  Completed  . INFLUENZA VACCINE  Discontinued   Fall Risk  11/08/2017 11/05/2016  09/01/2015 08/31/2012  Falls in the past year? No Yes Yes No  Number falls in past yr: - 2 or more 1 -  Injury with Fall? - Yes No -  Risk for fall due to : - - Impaired balance/gait -   Functional Status Survey:    Vitals:   10/25/18 1001  BP: 120/70  Pulse: 76  Resp: 18  Temp: 98.7 F (37.1 C)  SpO2: 97%  Weight: 134 lb (60.8 kg)  Height: 5' 2"  (1.575 m)   Body mass index is 24.51 kg/m. Physical Exam Vitals signs and nursing note reviewed.  Constitutional:      General: She is not in acute distress.    Appearance: Normal appearance. She is not ill-appearing, toxic-appearing or diaphoretic.  HENT:     Head: Normocephalic and atraumatic.     Nose: Rhinorrhea present.  Comments: All the time    Mouth/Throat:     Mouth: Mucous membranes are moist.  Eyes:     Extraocular Movements: Extraocular movements intact.     Conjunctiva/sclera: Conjunctivae normal.     Pupils: Pupils are equal, round, and reactive to light.  Neck:     Musculoskeletal: Normal range of motion and neck supple.  Cardiovascular:     Rate and Rhythm: Normal rate and regular rhythm.     Heart sounds: No murmur.  Pulmonary:     Breath sounds: No wheezing, rhonchi or rales.  Abdominal:     General: Bowel sounds are normal.     Palpations: Abdomen is soft.     Tenderness: There is no abdominal tenderness. There is no right CVA tenderness, left CVA tenderness, guarding or rebound.  Musculoskeletal:     Right lower leg: No edema.     Left lower leg: No edema.     Comments: Ambulates with walker.   Skin:    General: Skin is warm and dry.  Neurological:     General: No focal deficit present.     Mental Status: She is alert. Mental status is at baseline.     Cranial Nerves: No cranial nerve deficit.     Motor: No weakness.     Coordination: Coordination normal.     Gait: Gait abnormal.     Comments: Oriented to person, place.   Psychiatric:        Mood and Affect: Mood normal.        Behavior:  Behavior normal.        Thought Content: Thought content normal.     Labs reviewed: Recent Labs    11/08/17 11/22/17 02/10/18 06/26/18  NA  --  142 143 139  K 4.3 4.3 4.8 4.7  CL 106 107  --   --   CO2 28 28  --   --   BUN 17 13 16 14   CREATININE 0.8 0.8 0.8 0.7  CALCIUM 9.1 9.4  --   --    Recent Labs    11/08/17 02/10/18 06/26/18  AST 18 18 16   ALT 11 12 10   ALKPHOS 94 109  --   PROT 5.2  --   --   ALBUMIN 3.7  --   --    Recent Labs    02/10/18 02/24/18 03/30/18 06/26/18  WBC 6.9 9.0 5.5  --   HGB 11.5* 11.7* 12.1 12.2  HCT  --  35* 36 36  PLT 239 254 237 274   Lab Results  Component Value Date   TSH 2.35 09/13/2017   Lab Results  Component Value Date   HGBA1C 5.8 02/17/2017   Lab Results  Component Value Date   CHOL 197 02/17/2017   HDL 59 02/17/2017   LDLCALC 111 02/17/2017   LDLDIRECT 114.7 08/18/2011   TRIG 153 02/17/2017   CHOLHDL 2 01/23/2015    Significant Diagnostic Results in last 30 days:  No results found.  Assessment/Plan Cough Cough x 1 week, the patient declined CBC/diff, CMP/eGFR, CXR, observe.   GERD Stable, dc Omeprazole 1081m bid po. The patient esires to stop it. Observe.     Hypertension Blood pressure is controlled, dc Losartan 12.553mqd in setting of cough, will try Metoprolol 12.81m48mid. VS shift x 1wk.      Family/ staff Communication: plan of care reviewed with the patient and charge nurse.   Labs/tests ordered: CBC/diff, CMP/eGFR, CXR patient declined.   Time  spend 40 minutes.

## 2018-10-25 NOTE — Assessment & Plan Note (Signed)
Blood pressure is controlled, dc Losartan 12.5mg  qd in setting of cough, will try Metoprolol 12.5mg  bid. VS shift x 1wk.

## 2018-10-25 NOTE — Assessment & Plan Note (Signed)
Stable, dc Omeprazole 20mg  bid po. The patient esires to stop it. Observe.

## 2018-10-25 NOTE — Assessment & Plan Note (Signed)
Cough x 1 week, the patient declined CBC/diff, CMP/eGFR, CXR, observe.

## 2018-10-26 DIAGNOSIS — I10 Essential (primary) hypertension: Secondary | ICD-10-CM | POA: Diagnosis not present

## 2018-10-26 DIAGNOSIS — Z9889 Other specified postprocedural states: Secondary | ICD-10-CM | POA: Diagnosis not present

## 2018-10-26 DIAGNOSIS — R05 Cough: Secondary | ICD-10-CM | POA: Diagnosis not present

## 2018-10-26 DIAGNOSIS — R300 Dysuria: Secondary | ICD-10-CM | POA: Diagnosis not present

## 2018-10-26 DIAGNOSIS — Z961 Presence of intraocular lens: Secondary | ICD-10-CM | POA: Diagnosis not present

## 2018-10-26 LAB — CBC AND DIFFERENTIAL
HCT: 33 — AB (ref 36–46)
Hemoglobin: 10.9 — AB (ref 12.0–16.0)
Neutrophils Absolute: 3103
Platelets: 251 (ref 150–399)
WBC: 5.8

## 2018-10-26 LAB — HEPATIC FUNCTION PANEL
ALT: 11 (ref 7–35)
AST: 14 (ref 13–35)
Alkaline Phosphatase: 105 (ref 25–125)
Bilirubin, Total: 0.5

## 2018-10-26 LAB — BASIC METABOLIC PANEL
BUN: 10 (ref 4–21)
Creatinine: 0.8 (ref 0.5–1.1)
Glucose: 82
Potassium: 4.3 (ref 3.4–5.3)
Sodium: 144 (ref 137–147)

## 2018-11-06 ENCOUNTER — Other Ambulatory Visit: Payer: Self-pay | Admitting: *Deleted

## 2018-11-06 MED ORDER — PREGABALIN 50 MG PO CAPS: 50.0000 mg | ORAL_CAPSULE | Freq: Every day | ORAL | 0 refills | Status: DC

## 2018-11-06 NOTE — Telephone Encounter (Signed)
Received fax refill Request from [facility] Pended Rx and sent to Dr. Gupta for approval.  

## 2018-12-05 ENCOUNTER — Other Ambulatory Visit: Payer: Self-pay | Admitting: *Deleted

## 2018-12-05 MED ORDER — PREGABALIN 50 MG PO CAPS: 50.0000 mg | ORAL_CAPSULE | Freq: Every day | ORAL | 0 refills | Status: DC

## 2018-12-05 NOTE — Telephone Encounter (Signed)
Received fax from [facility] °Pended Rx and sent to [facility] for approval.  °

## 2018-12-06 ENCOUNTER — Non-Acute Institutional Stay: Payer: Medicare Other | Admitting: Nurse Practitioner

## 2018-12-06 ENCOUNTER — Encounter: Payer: Self-pay | Admitting: Nurse Practitioner

## 2018-12-06 DIAGNOSIS — M797 Fibromyalgia: Secondary | ICD-10-CM

## 2018-12-06 DIAGNOSIS — F22 Delusional disorders: Secondary | ICD-10-CM

## 2018-12-06 DIAGNOSIS — I10 Essential (primary) hypertension: Secondary | ICD-10-CM | POA: Diagnosis not present

## 2018-12-06 DIAGNOSIS — K219 Gastro-esophageal reflux disease without esophagitis: Secondary | ICD-10-CM

## 2018-12-06 LAB — CHLORIDE
Albumin: 3.6
Calcium: 9.3
Carbon Dioxide, Total: 30
Chloride: 108
Globulin: 1.8
Total Protein: 5.4 — AB (ref 6.4–8.2)

## 2018-12-06 NOTE — Assessment & Plan Note (Signed)
Hacking cough in am, off ACI/ARB didn't help, no c/o of heartburn, indigestion, or abd pain, denied chest pain or SOB. Will try Omeprazole 20mg  qd. Observe.

## 2018-12-06 NOTE — Assessment & Plan Note (Signed)
Stable, continue Flexeril 2.5mg  qhs, Lyrica 50mg  qd.

## 2018-12-06 NOTE — Assessment & Plan Note (Signed)
Stable, continue UTI suppression therapy, Nitrofurantoin 50mg  qhs.

## 2018-12-06 NOTE — Progress Notes (Signed)
Location:   [facility] Room Number: [id number] Place of Service:  ALF (13) Provider:  Alano Blasco X, NP  Yazlynn Birkeland X, NP  Patient Care Team: Raeford Brandenburg X, NP as PCP - General (Internal Medicine) Kyran Connaughton X, NP as Nurse Practitioner (Internal Medicine)  Extended Emergency Contact Information Primary Emergency Contact: Culbreth,Robin Address: [address] [place], [place] [address] [place] Phone: [phone] Mobile Phone: [phone] Relation: Daughter  Code Status:  Full Code Goals of care: Advanced Directive information Advanced Directives 12/06/2018  Does Patient Have a Medical Advance Directive? Yes  Type of Advance Directive Living will  Does patient want to make changes to medical advance directive? No - Patient declined  Copy of [place] in Chart? -  Would patient like information on creating a medical advance directive? -  Pre-existing out of facility DNR order (yellow form or pink MOST form) -     Chief Complaint  Patient presents with  . Medical Management of Chronic Issues  . Health Maintenance    PNA    HPI:  Pt is a 85 y.o. female seen today for medical management of chronic diseases.    The patient resides in [place] [facility] for safety, care assistance. Hx of delusions of parasitosis, refused Seroquel since 11/23/18. Fibromyalgia, pain is stable, on Flexeril 2.5mg  qhs, Lyrica 50mg  qd. HTN, blood pressure is controlled on Metoprolol 12.5mg  bid. UTI suppression therapy, stable on Nitrofurantoin 50mg  qhs.    Past Medical History:  Diagnosis Date  . Acute blood loss anemia 08/23/2013   04/13/16 TSH 1.20, Na 141, K 4.2, Bun 15, creat 0.79, wbc 5.5, Hgb 11.5, plt 283  . Anxiety   . Anxiety state 07/02/2007   Qualifier: Diagnosis of  By: Lenna Gilford MD, Deborra Medina   . 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 Cigarette smoker   . Colon polyps 2009   TWO TUBULAR ADENOMAS AND HYPERPLASTIC POLYPS  . Constipation  09/14/2013  . COPD (chronic obstructive pulmonary disease) ([place])   . Diverticulosis of colon   . DJD (degenerative joint disease)   . Dog bite of limb 07/14/2010   Dog bite 07/10/10 - dogs shots utd Last td was 08/2009  Localized tx only.    . Fibromyalgia   . GERD 12/19/2006   Qualifier: Diagnosis of  By: Julien Girt CMA, Marliss Czar  04/13/16 TSH 1.20, Na 141, K 4.2, Bun 15, creat 0.79, wbc 5.5, Hgb 11.5, plt 283   . GERD (gastroesophageal reflux disease)   . Hammer toe of second toe of left foot 04/08/2016   Left 2nd  . Hearing loss   . Hemorrhoids   . Hypercholesterolemia   . Osteoarthritis 12/19/2006   Qualifier: Diagnosis of  By: Julien Girt CMA, Marliss Czar    . Osteoporosis    Past Surgical History:  Procedure Laterality Date  . CATARACT EXTRACTION, BILATERAL  2009  . COLONOSCOPY  2009, 2006 , 2017  . stapes surgery     Dr Thornell Mule  . TONSILLECTOMY AND ADENOIDECTOMY     as a child    Allergies  Allergen Reactions  . Penicillins Anaphylaxis  . Bisphosphonates Other (See Comments)    [name]  . Influenza Vaccines     Pt reported  . Simvastatin     Unknown   . Trazodone And Nefazodone     Felt her throat was closing up/kept her awake  . Sulfonamide Derivatives Rash  and Other (See Comments)    blisters    Allergies as of 12/06/2018      Reactions   Penicillins Anaphylaxis   Bisphosphonates Other (See Comments)   [name]   Influenza Vaccines    Pt reported   Simvastatin    Unknown    Trazodone And Nefazodone    Felt her throat was closing up/kept her awake   Sulfonamide Derivatives Rash, Other (See Comments)   blisters      Medication List       Accurate as of December 06, 2018  4:17 PM. If you have any questions, ask your nurse or doctor.        STOP taking these medications   losartan 25 MG tablet Commonly known as: COZAAR Stopped by: Kennith Morss X Brettney Ficken, NP   omeprazole 20 MG capsule Commonly known as: PRILOSEC Stopped by: Noelene Gang X Yeila Morro, NP     TAKE these  medications   Calcium 600+D Plus Minerals 600-400 MG-UNIT Tabs Take 1 tablet by mouth daily.   calcium carbonate 750 MG chewable tablet Commonly known as: TUMS EX Chew 750 mg by mouth daily.   cholecalciferol 1000 units tablet Commonly known as: VITAMIN D Take 1,000 Units by mouth daily.   cyclobenzaprine 5 MG tablet Commonly known as: FLEXERIL Take 2.5 mg by mouth at bedtime.   estradiol 0.1 MG/GM vaginal cream Commonly known as: ESTRACE Place 1 Applicatorful vaginally daily. On Mon,Wed,Fri   hydrocortisone cream 1 % Apply 1 application topically 2 (two) times daily as needed. Apply to wrists   metoprolol tartrate 25 MG tablet Commonly known as: LOPRESSOR Take 12.5 mg by mouth 2 (two) times daily.   naproxen sodium 220 MG tablet Commonly known as: ALEVE Take 220 mg by mouth daily as needed. Take 1 tablet at dinner   nitrofurantoin 50 MG capsule Commonly known as: MACRODANTIN Take 50 mg by mouth at bedtime.   polyethylene glycol 17 g packet Commonly known as: MIRALAX / GLYCOLAX Take 17 g by mouth daily as needed. Once a day on Sun,Wed   pregabalin 50 MG capsule Commonly known as: LYRICA Take 1 capsule (50 mg total) by mouth daily.   QUEtiapine 12.5 mg Tabs tablet Commonly known as: SEROQUEL Take 12.5 mg by mouth daily.      ROS was provided with assistance of staff.  Review of Systems  Constitutional: Negative for activity change, appetite change, chills, diaphoresis, fatigue, fever and unexpected weight change.  HENT: Positive for hearing loss. Negative for congestion, trouble swallowing and voice change.   Eyes: Negative for visual disturbance.  Respiratory: Positive for cough. Negative for shortness of breath and wheezing.        In am, hacking, non productive, off ACI/ARB no help.   Cardiovascular: Negative for chest pain, palpitations and leg swelling.  Gastrointestinal: Negative for abdominal distention, abdominal pain, constipation, diarrhea, nausea and  vomiting.       Denied heart burns, indigestion  Genitourinary: Negative for difficulty urinating, dysuria and urgency.  Skin: Positive for color change and pallor.  Neurological: Negative for dizziness, speech difficulty, weakness and headaches.       Memory lapses.   Psychiatric/Behavioral: Positive for dysphoric mood, hallucinations and sleep disturbance. Negative for agitation and behavioral problems. The patient is not nervous/anxious.     Immunization History  Administered Date(s) Administered  . Pneumococcal Conjugate-13 09/01/2015  . Pneumococcal Polysaccharide-23 07/07/1998  . Td 08/21/2009   Pertinent  Health Maintenance Due  Topic Date Due  .  PNA vac Low Risk Adult (2 of 2 - PPSV23) 08/31/2016  . DEXA SCAN  Completed  . INFLUENZA VACCINE  Discontinued   Fall Risk  11/08/2017 11/05/2016 09/01/2015 08/31/2012  Falls in the past year? No Yes Yes No  Number falls in past yr: - 2 or more 1 -  Injury with Fall? - Yes No -  Risk for fall due to : - - Impaired balance/gait -   Functional Status Survey:    Vitals:   12/06/18 0829  BP: 132/64  Pulse: 76  Resp: 18  Temp: 98 F (36.7 C)  SpO2: 96%  Weight: 135 lb (61.2 kg)  Height: 5\' 2"  (1.575 m)   Body mass index is 24.69 kg/m. Physical Exam Vitals signs and nursing note reviewed.  Constitutional:      General: She is not in acute distress.    Appearance: Normal appearance. She is normal weight. She is not ill-appearing, toxic-appearing or diaphoretic.  HENT:     Head: Normocephalic and atraumatic.     Nose: Nose normal.     Mouth/Throat:     Mouth: Mucous membranes are moist.  Eyes:     Extraocular Movements: Extraocular movements intact.     Conjunctiva/sclera: Conjunctivae normal.     Pupils: Pupils are equal, round, and reactive to light.  Neck:     Musculoskeletal: Normal range of motion and neck supple.  Cardiovascular:     Rate and Rhythm: Normal rate and regular rhythm.     Heart sounds: No murmur.   Pulmonary:     Breath sounds: No wheezing or rales.  Abdominal:     General: Bowel sounds are normal. There is no distension.     Palpations: Abdomen is soft.     Tenderness: There is no abdominal tenderness. There is no right CVA tenderness, left CVA tenderness, guarding or rebound.  Musculoskeletal:     Right lower leg: No edema.     Left lower leg: No edema.  Skin:    General: Skin is warm and dry.  Neurological:     General: No focal deficit present.     Mental Status: She is alert. Mental status is at baseline.     Cranial Nerves: No cranial nerve deficit.     Motor: No weakness.     Coordination: Coordination normal.     Gait: Gait abnormal.     Comments: Oriented to person, place.   Psychiatric:        Mood and Affect: Mood normal.        Behavior: Behavior normal.     Labs reviewed: Recent Labs    02/10/18 06/26/18 10/26/18  NA 143 139 144  K 4.8 4.7 4.3  CL  --   --  108  CO2  --   --  30  BUN 16 14 10   CREATININE 0.8 0.7 0.8  CALCIUM  --   --  9.3   Recent Labs    02/10/18 06/26/18 10/26/18  AST 18 16 14   ALT 12 10 11   ALKPHOS 109  --  105  PROT  --   --  5.4*  ALBUMIN  --   --  3.6   Recent Labs    02/24/18 03/30/18 06/26/18 10/26/18  WBC 9.0 5.5  --  5.8  NEUTROABS  --   --   --  3,103  HGB 11.7* 12.1 12.2 10.9*  HCT 35* 36 36 33*  PLT 254 237 274 251   Lab Results  Component Value Date   TSH 2.35 09/13/2017   Lab Results  Component Value Date   HGBA1C 5.8 02/17/2017   Lab Results  Component Value Date   CHOL 197 02/17/2017   HDL 59 02/17/2017   LDLCALC 111 02/17/2017   LDLDIRECT 114.7 08/18/2011   TRIG 153 02/17/2017   CHOLHDL 2 01/23/2015    Significant Diagnostic Results in last 30 days:  No results found.  Assessment/Plan Fibromyalgia Stable, continue Flexeril 2.5mg  qhs, Lyrica 50mg  qd.   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 The patient stopped taking Seroquel since 11/23/18, will dc it, observe.   Hypertension  Blood pressure is controlled, continue Metoprolol 12.5mg  bid.   UTI (urinary tract infection) Stable, continue UTI suppression therapy, Nitrofurantoin 50mg  qhs.   GERD Hacking cough in am, off ACI/ARB didn't help, no c/o of heartburn, indigestion, or abd pain, denied chest pain or SOB. Will try Omeprazole 20mg  qd. Observe.      Family/ staff Communication:  Plan of care reviewed with the patient and charge nurse. Pneumovax 23 prescription provided.   Labs/tests ordered: none  Time spend 40 minutes.

## 2018-12-06 NOTE — Assessment & Plan Note (Signed)
Persisted. The patient stopped taking Seroquel since 11/23/18, will dc it, observe.

## 2018-12-06 NOTE — Assessment & Plan Note (Signed)
Blood pressure is controlled, continue Metoprolol 12.5mg bid.  

## 2018-12-19 DIAGNOSIS — Z20828 Contact with and (suspected) exposure to other viral communicable diseases: Secondary | ICD-10-CM | POA: Diagnosis not present

## 2018-12-26 DIAGNOSIS — Z03818 Encounter for observation for suspected exposure to other biological agents ruled out: Secondary | ICD-10-CM | POA: Diagnosis not present

## 2018-12-28 ENCOUNTER — Encounter: Payer: Self-pay | Admitting: Internal Medicine

## 2018-12-28 NOTE — Progress Notes (Signed)
A user error has taken place.

## 2019-01-02 DIAGNOSIS — Z20828 Contact with and (suspected) exposure to other viral communicable diseases: Secondary | ICD-10-CM | POA: Diagnosis not present

## 2019-01-03 ENCOUNTER — Other Ambulatory Visit: Payer: Self-pay | Admitting: *Deleted

## 2019-01-03 MED ORDER — PREGABALIN 50 MG PO CAPS: 50.0000 mg | ORAL_CAPSULE | Freq: Every day | ORAL | 0 refills | Status: DC

## 2019-01-03 NOTE — Telephone Encounter (Signed)
[facility] Requested Refill Pended Rx and sent to [facility] for approval.

## 2019-01-04 ENCOUNTER — Encounter: Payer: Self-pay | Admitting: Internal Medicine

## 2019-01-04 ENCOUNTER — Non-Acute Institutional Stay: Payer: Medicare Other | Admitting: Internal Medicine

## 2019-01-04 DIAGNOSIS — M797 Fibromyalgia: Secondary | ICD-10-CM

## 2019-01-04 DIAGNOSIS — I10 Essential (primary) hypertension: Secondary | ICD-10-CM

## 2019-01-04 DIAGNOSIS — R442 Other hallucinations: Secondary | ICD-10-CM

## 2019-01-04 DIAGNOSIS — M810 Age-related osteoporosis without current pathological fracture: Secondary | ICD-10-CM

## 2019-01-04 DIAGNOSIS — N390 Urinary tract infection, site not specified: Secondary | ICD-10-CM | POA: Diagnosis not present

## 2019-01-04 DIAGNOSIS — J449 Chronic obstructive pulmonary disease, unspecified: Secondary | ICD-10-CM | POA: Diagnosis not present

## 2019-01-04 NOTE — Progress Notes (Signed)
Location:    [facility] Home Room Number: [id number] Place of Service:  ALF (13) Provider:  Veleta Miners MD  Mast, Man X, NP  Patient Care Team: Mast, Man X, NP as PCP - General (Internal Medicine) Mast, Man X, NP as Nurse Practitioner (Internal Medicine)  Extended Emergency Contact Information Primary Emergency Contact: Culbreth,Robin Address: [address] [place], [place] [address] [place] Phone: [phone] Mobile Phone: [phone] Relation: Daughter  Code Status:  Full Code Goals of care: Advanced Directive information Advanced Directives 12/06/2018  Does Patient Have a Medical Advance Directive? Yes  Type of Advance Directive Living will  Does patient want to make changes to medical advance directive? No - Patient declined  Copy of [place] in Chart? -  Would patient like information on creating a medical advance directive? -  Pre-existing out of facility DNR order (yellow form or pink MOST form) -     Chief Complaint  Patient presents with  . Medical Management of Chronic Issues  . Health Maintenance    PPSV23    HPI:  Pt is a 85 y.o. female seen today for medical management of chronic diseases.    Patient is long term resident of AL unit in [place]. Patient has h/o fibromyalgia on Lyrica ,hypertension, hyperlipidemia ,anemia, osteoporosis,COPD , Insomnia and Tactile hallucinations but patient refuses to take any Meds for it  Patient is doing well and stabilized in the facility Her main complain today was this noises coming from her breathing. She thinks something is stuck in her Throat Also Has Cough with Clear Sputum Denies SOB or Fever or Chest Pain No Other Active Complains Patient has lost almost 10 lbs in Past 6 months    Past Medical History:  Diagnosis Date  . Acute blood loss anemia 08/23/2013   04/13/16 TSH 1.20, Na 141, K 4.2, Bun 15, creat 0.79, wbc 5.5, Hgb 11.5, plt 283  . Anxiety   .  Anxiety state 07/02/2007   Qualifier: Diagnosis of  By: Lenna Gilford MD, Deborra Medina   . 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 Cigarette smoker   . Colon polyps 2009   TWO TUBULAR ADENOMAS AND HYPERPLASTIC POLYPS  . Constipation 09/14/2013  . COPD (chronic obstructive pulmonary disease) ([place])   . Diverticulosis of colon   . DJD (degenerative joint disease)   . Dog bite of limb 07/14/2010   Dog bite 07/10/10 - dogs shots utd Last td was 08/2009  Localized tx only.    . Fibromyalgia   . GERD 12/19/2006   Qualifier: Diagnosis of  By: Julien Girt CMA, Marliss Czar  04/13/16 TSH 1.20, Na 141, K 4.2, Bun 15, creat 0.79, wbc 5.5, Hgb 11.5, plt 283   . GERD (gastroesophageal reflux disease)   . Hammer toe of second toe of left foot 04/08/2016   Left 2nd  . Hearing loss   . Hemorrhoids   . Hypercholesterolemia   . Osteoarthritis 12/19/2006   Qualifier: Diagnosis of  By: Julien Girt CMA, Marliss Czar    . Osteoporosis    Past Surgical History:  Procedure Laterality Date  . CATARACT EXTRACTION, BILATERAL  2009  . COLONOSCOPY  2009, 2006 , 2017  . stapes surgery     Dr Thornell Mule  . TONSILLECTOMY AND ADENOIDECTOMY     as a child    Allergies  Allergen Reactions  . Penicillins Anaphylaxis  . Bisphosphonates Other (See Comments)    pt states  INTOL  . Influenza Vaccines     Pt reported  . Simvastatin     Unknown   . Trazodone And Nefazodone     Felt her throat was closing up/kept her awake  . Sulfonamide Derivatives Rash and Other (See Comments)    blisters    Allergies as of 01/04/2019      Reactions   Penicillins Anaphylaxis   Bisphosphonates Other (See Comments)   [name]   Influenza Vaccines    Pt reported   Simvastatin    Unknown    Trazodone And Nefazodone    Felt her throat was closing up/kept her awake   Sulfonamide Derivatives Rash, Other (See Comments)   blisters      Medication List       Accurate as of January 04, 2019  9:18 AM. If you have any questions, ask your nurse or  doctor.        STOP taking these medications   dextromethorphan-guaiFENesin 10-100 MG/5ML liquid Commonly known as: ROBITUSSIN-DM Stopped by: Virgie Dad, MD     TAKE these medications   Calcium 600+D Plus Minerals 600-400 MG-UNIT Tabs Take 1 tablet by mouth daily.   calcium carbonate 750 MG chewable tablet Commonly known as: TUMS EX Chew 750 mg by mouth daily.   cholecalciferol 1000 units tablet Commonly known as: VITAMIN D Take 1,000 Units by mouth daily.   cyclobenzaprine 5 MG tablet Commonly known as: FLEXERIL Take 2.5 mg by mouth at bedtime.   estradiol 0.1 MG/GM vaginal cream Commonly known as: ESTRACE Place 1 Applicatorful vaginally daily. On Mon,Wed,Fri   hydrocortisone cream 1 % Apply 1 application topically 2 (two) times daily as needed. Apply to wrists   metoprolol tartrate 25 MG tablet Commonly known as: LOPRESSOR Take 12.5 mg by mouth 2 (two) times daily.   naproxen sodium 220 MG tablet Commonly known as: ALEVE Take 220 mg by mouth daily as needed. Take 1 tablet at dinner   nitrofurantoin 50 MG capsule Commonly known as: MACRODANTIN Take 50 mg by mouth at bedtime.   polyethylene glycol 17 g packet Commonly known as: MIRALAX / GLYCOLAX Take 17 g by mouth daily as needed. Once a day on Sun,Wed   pregabalin 50 MG capsule Commonly known as: LYRICA Take 1 capsule (50 mg total) by mouth daily.       Review of Systems  Constitutional: Positive for appetite change.  HENT: Negative.   Respiratory: Positive for cough. Negative for shortness of breath.   Cardiovascular: Negative.   Gastrointestinal: Negative.   Genitourinary: Negative.   Musculoskeletal: Negative.   Neurological: Negative.   Psychiatric/Behavioral: Negative.   All other systems reviewed and are negative.   Immunization History  Administered Date(s) Administered  . Pneumococcal Conjugate-13 09/01/2015  . Pneumococcal Polysaccharide-23 07/07/1998  . Td 08/21/2009    Pertinent  Health Maintenance Due  Topic Date Due  . PNA vac Low Risk Adult (2 of 2 - PPSV23) 08/31/2016  . DEXA SCAN  Completed  . INFLUENZA VACCINE  Discontinued   Fall Risk  11/08/2017 11/05/2016 09/01/2015 08/31/2012  Falls in the past year? No Yes Yes No  Number falls in past yr: - 2 or more 1 -  Injury with Fall? - Yes No -  Risk for fall due to : - - Impaired balance/gait -   Functional Status Survey:    Vitals:   01/04/19 0840  BP: 128/64  Pulse: 66  Resp: 18  Temp: 98.2 F (36.8 C)  SpO2: 93%  Weight: 128 lb (58.1 kg)  Height: 5\' 2"  (1.575 m)   Body mass index is 23.41 kg/m. Physical Exam Vitals signs reviewed.  Constitutional:      Appearance: Normal appearance.  HENT:     Head: Normocephalic.     Nose: Nose normal.     Mouth/Throat:     Mouth: Mucous membranes are moist.     Pharynx: Oropharynx is clear.  Eyes:     Pupils: Pupils are equal, round, and reactive to light.  Neck:     Musculoskeletal: Neck supple.  Cardiovascular:     Rate and Rhythm: Normal rate and regular rhythm.     Pulses: Normal pulses.  Pulmonary:     Effort: Pulmonary effort is normal.     Comments: Patient had Bilateral Expiratory Wheezing Abdominal:     General: Abdomen is flat. Bowel sounds are normal.     Palpations: Abdomen is soft.  Musculoskeletal:        General: No swelling.  Skin:    General: Skin is warm and dry.  Neurological:     General: No focal deficit present.     Mental Status: She is alert and oriented to person, place, and time.  Psychiatric:        Mood and Affect: Mood normal.        Thought Content: Thought content normal.     Labs reviewed: Recent Labs    02/10/18 06/26/18 10/26/18  NA 143 139 144  K 4.8 4.7 4.3  CL  --   --  108  CO2  --   --  30  BUN 16 14 10   CREATININE 0.8 0.7 0.8  CALCIUM  --   --  9.3   Recent Labs    02/10/18 06/26/18 10/26/18  AST 18 16 14   ALT 12 10 11   ALKPHOS 109  --  105  PROT  --   --  5.4*  ALBUMIN  --    --  3.6   Recent Labs    02/24/18 03/30/18 06/26/18 10/26/18  WBC 9.0 5.5  --  5.8  NEUTROABS  --   --   --  3,103  HGB 11.7* 12.1 12.2 10.9*  HCT 35* 36 36 33*  PLT 254 237 274 251   Lab Results  Component Value Date   TSH 2.35 09/13/2017   Lab Results  Component Value Date   HGBA1C 5.8 02/17/2017   Lab Results  Component Value Date   CHOL 197 02/17/2017   HDL 59 02/17/2017   LDLCALC 111 02/17/2017   LDLDIRECT 114.7 08/18/2011   TRIG 153 02/17/2017   CHOLHDL 2 01/23/2015    Significant Diagnostic Results in last 30 days:  No results found.  Assessment/Plan COPD (chronic obstructive pulmonary disease) with chronic bronchitis (HCC) Will start on Symbicort for few weeks and reval Patient usually refuses to take Meds She has agreed today to take it Fibromyalgia Stable on Lyrica Tactile hallucinations Not having any symptoms now Has been on Risperdal before  Essential hypertension Stable on Lisinopril and Lopressor  Recurrent UTI Was seen By Dr Karsten Ro On Low dose of Nitrofurantoin Will not do any changes She says her symptoms are better  Age-related osteoporosis without current pathological fracture T scrore -2.8 in 2018 Refused therapy Calcium and Vit D Will d/w her again if she wants repeat [place]     Family/ staff Communication:   Labs/tests ordered:   Total time spent in this patient care encounter  was  40  minutes; greater than 50% of the visit spent counseling patient and staff, reviewing records , Labs and coordinating care for problems addressed at this encounter.

## 2019-01-10 LAB — IRON,TIBC AND FERRITIN PANEL
%SAT: 21
Ferritin: 46
Iron: 59
TIBC: 281

## 2019-01-10 LAB — BASIC METABOLIC PANEL
BUN: 18 (ref 4–21)
CO2: 27 — AB (ref 13–22)
Chloride: 107 (ref 99–108)
Creatinine: 0.8 (ref 0.5–1.1)
Glucose: 82
Potassium: 4.1 (ref 3.4–5.3)
Sodium: 141 (ref 137–147)

## 2019-01-10 LAB — TSH: TSH: 1.92 (ref 0.41–5.90)

## 2019-01-10 LAB — LIPID PANEL
Cholesterol: 177 (ref 0–200)
HDL: 52 (ref 35–70)
LDL Cholesterol: 104
LDl/HDL Ratio: 3.4
Triglycerides: 110 (ref 40–160)

## 2019-01-10 LAB — HEPATIC FUNCTION PANEL
ALT: 16 (ref 7–35)
AST: 21 (ref 13–35)
Alkaline Phosphatase: 82 (ref 25–125)
Bilirubin, Total: 0.4

## 2019-01-10 LAB — CBC AND DIFFERENTIAL
HCT: 32 — AB (ref 36–46)
Hemoglobin: 10.6 — AB (ref 12.0–16.0)
Neutrophils Absolute: 2678
Platelets: 195 (ref 150–399)
WBC: 5.1

## 2019-01-10 LAB — CBC: RBC: 3.54 — AB (ref 3.87–5.11)

## 2019-01-10 LAB — COMPREHENSIVE METABOLIC PANEL
Albumin: 3.6 (ref 3.5–5.0)
Calcium: 8.8 (ref 8.7–10.7)
Globulin: 1.7

## 2019-01-10 LAB — VITAMIN B12: Vitamin B-12: 314

## 2019-01-30 DIAGNOSIS — Z20828 Contact with and (suspected) exposure to other viral communicable diseases: Secondary | ICD-10-CM | POA: Diagnosis not present

## 2019-02-05 ENCOUNTER — Other Ambulatory Visit: Payer: Self-pay | Admitting: *Deleted

## 2019-02-05 MED ORDER — PREGABALIN 50 MG PO CAPS: 50.0000 mg | ORAL_CAPSULE | Freq: Every day | ORAL | 0 refills | Status: DC

## 2019-02-05 NOTE — Telephone Encounter (Signed)
Received fax refill Request [facility] Pended Rx and sent to Dr. Lyndel Safe for approval.

## 2019-02-06 DIAGNOSIS — Z20828 Contact with and (suspected) exposure to other viral communicable diseases: Secondary | ICD-10-CM | POA: Diagnosis not present

## 2019-02-17 DIAGNOSIS — Z23 Encounter for immunization: Secondary | ICD-10-CM | POA: Diagnosis not present

## 2019-02-28 DIAGNOSIS — Z20828 Contact with and (suspected) exposure to other viral communicable diseases: Secondary | ICD-10-CM | POA: Diagnosis not present

## 2019-03-07 DIAGNOSIS — Z20828 Contact with and (suspected) exposure to other viral communicable diseases: Secondary | ICD-10-CM | POA: Diagnosis not present

## 2019-03-09 ENCOUNTER — Other Ambulatory Visit: Payer: Self-pay | Admitting: *Deleted

## 2019-03-09 MED ORDER — PREGABALIN 50 MG PO CAPS: 50.0000 mg | ORAL_CAPSULE | Freq: Every day | ORAL | 0 refills | Status: DC

## 2019-03-09 NOTE — Telephone Encounter (Signed)
Received fax from [facility] °Pended Rx and sent to [facility] for approval.  °

## 2019-03-14 DIAGNOSIS — Z20828 Contact with and (suspected) exposure to other viral communicable diseases: Secondary | ICD-10-CM | POA: Diagnosis not present

## 2019-03-17 DIAGNOSIS — Z23 Encounter for immunization: Secondary | ICD-10-CM | POA: Diagnosis not present

## 2019-03-21 ENCOUNTER — Non-Acute Institutional Stay: Payer: Medicare Other | Admitting: Nurse Practitioner

## 2019-03-21 ENCOUNTER — Encounter: Payer: Self-pay | Admitting: Nurse Practitioner

## 2019-03-21 DIAGNOSIS — I10 Essential (primary) hypertension: Secondary | ICD-10-CM | POA: Diagnosis not present

## 2019-03-21 DIAGNOSIS — F5105 Insomnia due to other mental disorder: Secondary | ICD-10-CM

## 2019-03-21 DIAGNOSIS — N952 Postmenopausal atrophic vaginitis: Secondary | ICD-10-CM | POA: Diagnosis not present

## 2019-03-21 DIAGNOSIS — M8949 Other hypertrophic osteoarthropathy, multiple sites: Secondary | ICD-10-CM | POA: Diagnosis not present

## 2019-03-21 DIAGNOSIS — R300 Dysuria: Secondary | ICD-10-CM | POA: Diagnosis not present

## 2019-03-21 DIAGNOSIS — F419 Anxiety disorder, unspecified: Secondary | ICD-10-CM | POA: Diagnosis not present

## 2019-03-21 DIAGNOSIS — N390 Urinary tract infection, site not specified: Secondary | ICD-10-CM

## 2019-03-21 DIAGNOSIS — M159 Polyosteoarthritis, unspecified: Secondary | ICD-10-CM

## 2019-03-21 DIAGNOSIS — Z20828 Contact with and (suspected) exposure to other viral communicable diseases: Secondary | ICD-10-CM | POA: Diagnosis not present

## 2019-03-21 NOTE — Assessment & Plan Note (Signed)
Chronic leg pain, declined further evaluation or treatment, continue Flexeril, Lyrica.

## 2019-03-21 NOTE — Assessment & Plan Note (Signed)
Chronic, declined hypnotics.

## 2019-03-21 NOTE — Assessment & Plan Note (Signed)
Blood pressure is controlled, continue Metoprolol. 

## 2019-03-21 NOTE — Progress Notes (Signed)
Location:   [facility] Room Number: [id number] Place of Service:  ALF (13) Provider:  Sarafina Puthoff X, NP  Decoda Van X, NP  Patient Care Team: Winford Hehn X, NP as PCP - General (Internal Medicine) Lalita Ebel X, NP as Nurse Practitioner (Internal Medicine)  Extended Emergency Contact Information Primary Emergency Contact: Culbreth,Robin Address: [address] [place], [place] [address] [place] Phone: [phone] Mobile Phone: [phone] Relation: Daughter  Code Status:  Full Code Goals of care: Advanced Directive information Advanced Directives 03/21/2019  Does Patient Have a Medical Advance Directive? Yes  Type of Advance Directive Living will  Does patient want to make changes to medical advance directive? No - Patient declined  Copy of [place] in Chart? -  Would patient like information on creating a medical advance directive? -  Pre-existing out of facility DNR order (yellow form or pink MOST form) -     Chief Complaint  Patient presents with  . Medical Management of Chronic Issues  . Health Maintenance    PPSV23    HPI:  Pt is a 86 y.o. female seen today for medical management of chronic diseases.    The patient resides in [facility] for safety, care assistance, chronic lower back, leg pain, on Felxeril 2.5mg  qhs, Lyrica 50mg  qd, the patient declined further evaluation or treatment. HTN, blood pressure is controlled on Metoprolol 12.5mg  bid. Urinary symptoms, stable on Nitrofurantoin 50mg  qd for UTI suppression, Estrace vaginal cream for atrophic vaginitis.    Past Medical History:  Diagnosis Date  . Acute blood loss anemia 08/23/2013   04/13/16 TSH 1.20, Na 141, K 4.2, Bun 15, creat 0.79, wbc 5.5, Hgb 11.5, plt 283  . Anxiety   . Anxiety state 07/02/2007   Qualifier: Diagnosis of  By: Lenna Gilford MD, Deborra Medina   . 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 Cigarette smoker   . Colon polyps 2009    TWO TUBULAR ADENOMAS AND HYPERPLASTIC POLYPS  . Constipation 09/14/2013  . COPD (chronic obstructive pulmonary disease) ([place])   . Diverticulosis of colon   . DJD (degenerative joint disease)   . Dog bite of limb 07/14/2010   Dog bite 07/10/10 - dogs shots utd Last td was 08/2009  Localized tx only.    . Fibromyalgia   . GERD 12/19/2006   Qualifier: Diagnosis of  By: Julien Girt CMA, Marliss Czar  04/13/16 TSH 1.20, Na 141, K 4.2, Bun 15, creat 0.79, wbc 5.5, Hgb 11.5, plt 283   . GERD (gastroesophageal reflux disease)   . Hammer toe of second toe of left foot 04/08/2016   Left 2nd  . Hearing loss   . Hemorrhoids   . Hypercholesterolemia   . Osteoarthritis 12/19/2006   Qualifier: Diagnosis of  By: Julien Girt CMA, Marliss Czar    . Osteoporosis    Past Surgical History:  Procedure Laterality Date  . CATARACT EXTRACTION, BILATERAL  2009  . COLONOSCOPY  2009, 2006 , 2017  . stapes surgery     Dr Thornell Mule  . TONSILLECTOMY AND ADENOIDECTOMY     as a child    Allergies  Allergen Reactions  . Penicillins Anaphylaxis  . Bisphosphonates Other (See Comments)    [name]  . Influenza Vaccines     Pt reported  . Simvastatin     Unknown   . Trazodone And Nefazodone     Felt her throat was closing  up/kept her awake  . Sulfonamide Derivatives Rash and Other (See Comments)    blisters    Allergies as of 03/21/2019      Reactions   Penicillins Anaphylaxis   Bisphosphonates Other (See Comments)   [name]   Influenza Vaccines    Pt reported   Simvastatin    Unknown    Trazodone And Nefazodone    Felt her throat was closing up/kept her awake   Sulfonamide Derivatives Rash, Other (See Comments)   blisters      Medication List       Accurate as of March 21, 2019  2:53 PM. If you have any questions, ask your nurse or doctor.        Calcium 600+D Plus Minerals 600-400 MG-UNIT Tabs Take 1 tablet by mouth daily.   calcium carbonate 750 MG chewable tablet Commonly known as: TUMS EX Chew 750  mg by mouth daily.   cholecalciferol 1000 units tablet Commonly known as: VITAMIN D Take 1,000 Units by mouth daily.   cyclobenzaprine 5 MG tablet Commonly known as: FLEXERIL Take 2.5 mg by mouth at bedtime.   estradiol 0.1 MG/GM vaginal cream Commonly known as: ESTRACE Place 1 Applicatorful vaginally daily. On Mon,Wed,Fri   hydrocortisone cream 1 % Apply 1 application topically 2 (two) times daily as needed. Apply to wrists   metoprolol tartrate 25 MG tablet Commonly known as: LOPRESSOR Take 12.5 mg by mouth 2 (two) times daily.   naproxen sodium 220 MG tablet Commonly known as: ALEVE Take 220 mg by mouth daily as needed. Take 1 tablet at dinner   nitrofurantoin 50 MG capsule Commonly known as: MACRODANTIN Take 50 mg by mouth at bedtime.   polyethylene glycol 17 g packet Commonly known as: MIRALAX / GLYCOLAX Take 17 g by mouth daily as needed. Once a day on Sun,Wed   pregabalin 50 MG capsule Commonly known as: LYRICA Take 1 capsule (50 mg total) by mouth daily.       Review of Systems  Constitutional: Negative for activity change, appetite change, chills, diaphoresis, fatigue, fever and unexpected weight change.  HENT: Positive for hearing loss. Negative for congestion and voice change.   Eyes: Negative for visual disturbance.  Respiratory: Negative for cough, shortness of breath and wheezing.   Cardiovascular: Negative for chest pain, palpitations and leg swelling.  Gastrointestinal: Negative for abdominal distention, abdominal pain, constipation, diarrhea, nausea and vomiting.  Genitourinary: Negative for difficulty urinating, dysuria and urgency.  Musculoskeletal: Positive for arthralgias, back pain, gait problem and myalgias.  Skin: Negative for color change and pallor.  Neurological: Negative for dizziness, speech difficulty, weakness and headaches.  Psychiatric/Behavioral: Positive for sleep disturbance. Negative for agitation, behavioral problems and  hallucinations. The patient is not nervous/anxious.     Immunization History  Administered Date(s) Administered  . Pneumococcal Conjugate-13 09/01/2015  . Pneumococcal Polysaccharide-23 07/07/1998  . Td 08/21/2009   Pertinent  Health Maintenance Due  Topic Date Due  . PNA vac Low Risk Adult (2 of 2 - PPSV23) 08/31/2016  . DEXA SCAN  Completed  . INFLUENZA VACCINE  Discontinued   Fall Risk  11/08/2017 11/05/2016 09/01/2015 08/31/2012  Falls in the past year? No Yes Yes No  Number falls in past yr: - 2 or more 1 -  Injury with Fall? - Yes No -  Risk for fall due to : - - Impaired balance/gait -   Functional Status Survey:    Vitals:   03/21/19 0833  BP: 126/70  Pulse: 64  Resp: 18  Temp: 97.9 F (36.6 C)  SpO2: 96%  Weight: 133 lb 8 oz (60.6 kg)  Height: 5\' 2"  (1.575 m)   Body mass index is 24.42 kg/m. Physical Exam Vitals and nursing note reviewed.  Constitutional:      General: She is not in acute distress.    Appearance: Normal appearance. She is not ill-appearing, toxic-appearing or diaphoretic.  HENT:     Head: Normocephalic and atraumatic.     Nose: Nose normal.     Mouth/Throat:     Mouth: Mucous membranes are moist.  Eyes:     Extraocular Movements: Extraocular movements intact.     Conjunctiva/sclera: Conjunctivae normal.     Pupils: Pupils are equal, round, and reactive to light.  Cardiovascular:     Rate and Rhythm: Normal rate and regular rhythm.     Heart sounds: No murmur.  Pulmonary:     Breath sounds: No wheezing, rhonchi or rales.     Comments: Decreased air entry to both lungs.  Abdominal:     General: Bowel sounds are normal. There is no distension.     Palpations: Abdomen is soft.     Tenderness: There is no abdominal tenderness. There is no left CVA tenderness, guarding or rebound.  Musculoskeletal:     Cervical back: Normal range of motion and neck supple.     Right lower leg: No edema.     Left lower leg: No edema.  Skin:    General:  Skin is warm and dry.  Neurological:     General: No focal deficit present.     Mental Status: She is alert. Mental status is at baseline.     Motor: No weakness.     Coordination: Coordination normal.     Gait: Gait abnormal.     Comments: Oriented to person, place.   Psychiatric:        Mood and Affect: Mood normal.        Behavior: Behavior normal.     Labs reviewed: Recent Labs    06/26/18 0000 10/26/18 0000 01/10/19 0000  NA 139 144 141  K 4.7 4.3 4.1  CL  --  108 107  CO2  --  30 27*  BUN 14 10 18   CREATININE 0.7 0.8 0.8  CALCIUM  --  9.3 8.8   Recent Labs    06/26/18 0000 10/26/18 0000 01/10/19 0000  AST 16 14 21   ALT 10 11 16   ALKPHOS  --  105 82  PROT  --  5.4*  --   ALBUMIN  --  3.6 3.6   Recent Labs    03/30/18 0000 03/30/18 0000 06/26/18 0000 10/26/18 0000 01/10/19 0000  WBC 5.5  --   --  5.8 5.1  NEUTROABS  --   --   --  3,103 2,678  HGB 12.1   < > 12.2 10.9* 10.6*  HCT 36   < > 36 33* 32*  PLT 237   < > 274 251 195   < > = values in this interval not displayed.   Lab Results  Component Value Date   TSH 1.92 01/10/2019   Lab Results  Component Value Date   HGBA1C 5.8 02/17/2017   Lab Results  Component Value Date   CHOL 177 01/10/2019   HDL 52 01/10/2019   LDLCALC 104 01/10/2019   LDLDIRECT 114.7 08/18/2011   TRIG 110 01/10/2019   CHOLHDL 2 01/23/2015    Significant Diagnostic  Results in last 30 days:  No results found.  Assessment/Plan Hypertension Blood pressure is controlled, continue Metoprolol.   Osteoarthritis Chronic leg pain, declined further evaluation or treatment, continue Flexeril, Lyrica.   UTI (urinary tract infection) Continue Nitrofurantoin 50mg  qd for UTI suppression therapy.   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   Atrophic vaginitis Stable,  continue Estrace vaginal cream.      Family/ staff Communication: plan of care reviewed with the patient and charge nurse.   Labs/tests ordered:  none  Time spend 40 minutes.

## 2019-03-21 NOTE — Assessment & Plan Note (Signed)
S/p Cystoscopy 10/10/18, on Nitrofurantoin 50mg  qd for UTI suppression therapy, Estrace vaginal cream for atrophic vaginitis

## 2019-03-21 NOTE — Assessment & Plan Note (Signed)
Stable, continue Estrace vaginal cream.

## 2019-03-21 NOTE — Assessment & Plan Note (Signed)
Continue Nitrofurantoin 50mg  qd for UTI suppression therapy.

## 2019-03-28 DIAGNOSIS — Z20828 Contact with and (suspected) exposure to other viral communicable diseases: Secondary | ICD-10-CM | POA: Diagnosis not present

## 2019-04-04 DIAGNOSIS — Z20828 Contact with and (suspected) exposure to other viral communicable diseases: Secondary | ICD-10-CM | POA: Diagnosis not present

## 2019-04-05 ENCOUNTER — Other Ambulatory Visit: Payer: Self-pay | Admitting: *Deleted

## 2019-04-05 NOTE — Telephone Encounter (Signed)
Received fax refill Request from [facility] °Pended Rx and sent to [facility] for approval.  °

## 2019-04-06 MED ORDER — PREGABALIN 50 MG PO CAPS: 50.0000 mg | ORAL_CAPSULE | Freq: Every day | ORAL | 0 refills | Status: DC

## 2019-04-10 ENCOUNTER — Non-Acute Institutional Stay: Payer: Medicare Other | Admitting: Internal Medicine

## 2019-04-10 ENCOUNTER — Encounter: Payer: Self-pay | Admitting: Internal Medicine

## 2019-04-10 DIAGNOSIS — M797 Fibromyalgia: Secondary | ICD-10-CM | POA: Diagnosis not present

## 2019-04-10 DIAGNOSIS — R300 Dysuria: Secondary | ICD-10-CM

## 2019-04-10 DIAGNOSIS — R442 Other hallucinations: Secondary | ICD-10-CM

## 2019-04-10 DIAGNOSIS — I10 Essential (primary) hypertension: Secondary | ICD-10-CM | POA: Diagnosis not present

## 2019-04-10 NOTE — Progress Notes (Signed)
Location:   [facility] Room Number: [id number] Place of Service:  ALF [phone]) Provider:  Veleta Miners MD  Mast, Man X, NP  Patient Care Team: Mast, Man X, NP as PCP - General (Internal Medicine) Mast, Man X, NP as Nurse Practitioner (Internal Medicine)  Extended Emergency Contact Information Primary Emergency Contact: Culbreth,Robin Address: [address] [place], [place] [address] [place] Phone: [phone] Mobile Phone: [phone] Relation: Daughter  Code Status:  Full Code Goals of care: Advanced Directive information Advanced Directives 04/10/2019  Does Patient Have a Medical Advance Directive? Yes  Type of Advance Directive Living will  Does patient want to make changes to medical advance directive? No - Patient declined  Copy of [place] in Chart? -  Would patient like information on creating a medical advance directive? -  Pre-existing out of facility DNR order (yellow form or pink MOST form) -     Chief Complaint  Patient presents with  . Acute Visit    Dysuria    HPI:  Pt is a 86 y.o. female seen today for an acute visit for Dysuria  Patient is long term resident of AL unit in [place]. Patient has h/ofibromyalgia on Lyrica,hypertension, hyperlipidemia,anemia, osteoporosis,COPD , Insomnia and Tactile hallucinations but patient refuses to take any Meds   She also Has h/o Recurrent UTI. Has been doing well on Nitrofurantoin and Estrogen Cream But for past one week has been having Symptoms of Dysuria and Lower Abdominal Pain. Has been drinking Water and that has been helping No Fever or Chills. No Nausea or vomiting     Past Medical History:  Diagnosis Date  . Acute blood loss anemia 08/23/2013   04/13/16 TSH 1.20, Na 141, K 4.2, Bun 15, creat 0.79, wbc 5.5, Hgb 11.5, plt 283  . Anxiety   . Anxiety state 07/02/2007   Qualifier: Diagnosis of  By: Lenna Gilford MD, Deborra Medina   . Carotid  artery-cavernous sinus fistula 03/23/2016  . Cigarette smoker   . Colon polyps 2009   TWO TUBULAR ADENOMAS AND HYPERPLASTIC POLYPS  . Constipation 09/14/2013  . COPD (chronic obstructive pulmonary disease) ([place])   . Diverticulosis of colon   . DJD (degenerative joint disease)   . Dog bite of limb 07/14/2010   Dog bite 07/10/10 - dogs shots utd Last td was 08/2009  Localized tx only.    . Fibromyalgia   . GERD 12/19/2006   Qualifier: Diagnosis of  By: Julien Girt CMA, Marliss Czar  04/13/16 TSH 1.20, Na 141, K 4.2, Bun 15, creat 0.79, wbc 5.5, Hgb 11.5, plt 283   . GERD (gastroesophageal reflux disease)   . Hammer toe of second toe of left foot 04/08/2016   Left 2nd  . Hearing loss   . Hemorrhoids   . Hypercholesterolemia   . Osteoarthritis 12/19/2006   Qualifier: Diagnosis of  By: Julien Girt CMA, Marliss Czar    . Osteoporosis    Past Surgical History:  Procedure Laterality Date  . CATARACT EXTRACTION, BILATERAL  2009  . COLONOSCOPY  2009, 2006 , 2017  . stapes surgery     Dr Thornell Mule  . TONSILLECTOMY AND ADENOIDECTOMY     as a child    Allergies  Allergen Reactions  . Penicillins Anaphylaxis  . Bisphosphonates Other (See Comments)    [name]  . Influenza Vaccines     Pt reported  . Simvastatin  Unknown   . Trazodone And Nefazodone     Felt her throat was closing up/kept her awake  . Sulfonamide Derivatives Rash and Other (See Comments)    blisters    Allergies as of 04/10/2019      Reactions   Penicillins Anaphylaxis   Bisphosphonates Other (See Comments)   [name]   Influenza Vaccines    Pt reported   Simvastatin    Unknown    Trazodone And Nefazodone    Felt her throat was closing up/kept her awake   Sulfonamide Derivatives Rash, Other (See Comments)   blisters      Medication List       Accurate as of April 10, 2019 10:57 AM. If you have any questions, ask your nurse or doctor.        Calcium 600+D Plus Minerals 600-400 MG-UNIT Tabs Take 1 tablet by mouth  daily.   calcium carbonate 750 MG chewable tablet Commonly known as: TUMS EX Chew 750 mg by mouth daily.   cholecalciferol 1000 units tablet Commonly known as: VITAMIN D Take 1,000 Units by mouth daily.   cyclobenzaprine 5 MG tablet Commonly known as: FLEXERIL Take 2.5 mg by mouth at bedtime.   estradiol 0.1 MG/GM vaginal cream Commonly known as: ESTRACE Place 1 Applicatorful vaginally daily. On Mon,Wed,Fri   hydrocortisone cream 1 % Apply 1 application topically 2 (two) times daily as needed. Apply to wrists   metoprolol tartrate 25 MG tablet Commonly known as: LOPRESSOR Take 12.5 mg by mouth 2 (two) times daily.   naproxen sodium 220 MG tablet Commonly known as: ALEVE Take 220 mg by mouth daily as needed. Take 1 tablet at dinner   nitrofurantoin 50 MG capsule Commonly known as: MACRODANTIN Take 50 mg by mouth at bedtime.   polyethylene glycol 17 g packet Commonly known as: MIRALAX / GLYCOLAX Take 17 g by mouth daily as needed. Once a day on Sun,Wed   pregabalin 50 MG capsule Commonly known as: LYRICA Take 1 capsule (50 mg total) by mouth daily.       Review of Systems  Constitutional: Positive for activity change.  HENT: Negative.   Respiratory: Negative.   Cardiovascular: Negative.   Gastrointestinal: Negative.   Genitourinary: Positive for dysuria, pelvic pain and urgency.  Musculoskeletal: Negative.   Neurological: Positive for weakness.  Psychiatric/Behavioral: Negative.     Immunization History  Administered Date(s) Administered  . Moderna SARS-COVID-2 Vaccination 02/17/2019, 03/17/2019  . Pneumococcal Conjugate-13 09/01/2015  . Pneumococcal Polysaccharide-23 07/07/1998  . Td 08/21/2009   Pertinent  Health Maintenance Due  Topic Date Due  . PNA vac Low Risk Adult (2 of 2 - PPSV23) 08/31/2016  . DEXA SCAN  Completed  . INFLUENZA VACCINE  Discontinued   Fall Risk  11/08/2017 11/05/2016 09/01/2015 08/31/2012  Falls in the past year? No Yes Yes No    Number falls in past yr: - 2 or more 1 -  Injury with Fall? - Yes No -  Risk for fall due to : - - Impaired balance/gait -   Functional Status Survey:    Vitals:   04/10/19 1052  BP: 128/64  Pulse: 70  Resp: 18  Temp: 97.6 F (36.4 C)  SpO2: 95%  Weight: 133 lb 8 oz (60.6 kg)  Height: 5\' 2"  (1.575 m)   Body mass index is 24.42 kg/m. Physical Exam  Constitutional: . Well-developed and well-nourished.  HENT:  Head: Normocephalic.  Mouth/Throat: Oropharynx is clear and moist.  Eyes: Pupils  are equal, round, and reactive to light.  Neck: Neck supple.  Cardiovascular: Normal rate and normal heart sounds.  No murmur heard. Pulmonary/Chest: Effort normal and breath sounds normal. No respiratory distress. No wheezes. She has no rales.  Abdominal: Soft. Bowel sounds are normal. No distension. There is no tenderness. There is no rebound. C/O Lower Abdominal pain but not Tender Musculoskeletal: No edema.  Lymphadenopathy: none Neurological: Alert and oriented to person, place, and time.  Skin: Skin is warm and dry.  Psychiatric: Normal mood and affect. Behavior is normal. Thought content normal.    Labs reviewed: Recent Labs    06/26/18 0000 10/26/18 0000 01/10/19 0000  NA 139 144 141  K 4.7 4.3 4.1  CL  --  108 107  CO2  --  30 27*  BUN 14 10 18   CREATININE 0.7 0.8 0.8  CALCIUM  --  9.3 8.8   Recent Labs    06/26/18 0000 10/26/18 0000 01/10/19 0000  AST 16 14 21   ALT 10 11 16   ALKPHOS  --  105 82  PROT  --  5.4*  --   ALBUMIN  --  3.6 3.6   Recent Labs    06/26/18 0000 10/26/18 0000 01/10/19 0000  WBC  --  5.8 5.1  NEUTROABS  --  3,103 2,678  HGB 12.2 10.9* 10.6*  HCT 36 33* 32*  PLT 274 251 195   Lab Results  Component Value Date   TSH 1.92 01/10/2019   Lab Results  Component Value Date   HGBA1C 5.8 02/17/2017   Lab Results  Component Value Date   CHOL 177 01/10/2019   HDL 52 01/10/2019   LDLCALC 104 01/10/2019   LDLDIRECT 114.7  08/18/2011   TRIG 110 01/10/2019   CHOLHDL 2 01/23/2015    Significant Diagnostic Results in last 30 days:  No results found.  Assessment/Plan  Dysuria Will Check UA And Culture On Nitrofurantoin and Estrace Encourage Po Fluids  Fibromyalgia On Lyrica Tactile hallucinations Not having any symptoms now Has been on Risperdal before  Essential hypertension Stable on Lopressor   Age-related osteoporosis without current pathological fracture T scrore -2.8 in 2018 Refused therapy Calcium and Vit D  Family/ staff Communication:   Labs/tests ordered:  UA and Culture Repeat CMP and CBC

## 2019-04-11 DIAGNOSIS — R300 Dysuria: Secondary | ICD-10-CM | POA: Diagnosis not present

## 2019-04-11 DIAGNOSIS — N390 Urinary tract infection, site not specified: Secondary | ICD-10-CM | POA: Diagnosis not present

## 2019-04-12 DIAGNOSIS — D649 Anemia, unspecified: Secondary | ICD-10-CM | POA: Diagnosis not present

## 2019-04-12 DIAGNOSIS — I10 Essential (primary) hypertension: Secondary | ICD-10-CM | POA: Diagnosis not present

## 2019-04-12 DIAGNOSIS — R300 Dysuria: Secondary | ICD-10-CM | POA: Diagnosis not present

## 2019-04-12 DIAGNOSIS — N390 Urinary tract infection, site not specified: Secondary | ICD-10-CM | POA: Diagnosis not present

## 2019-04-12 LAB — COMPREHENSIVE METABOLIC PANEL
Albumin: 3.5 (ref 3.5–5.0)
Calcium: 8.9 (ref 8.7–10.7)
GFR calc Af Amer: 79
GFR calc non Af Amer: 68
Globulin: 1.8

## 2019-04-12 LAB — CBC AND DIFFERENTIAL
HCT: 33 — AB (ref 36–46)
Hemoglobin: 10.9 — AB (ref 12.0–16.0)
Neutrophils Absolute: 2647
Platelets: 213 (ref 150–399)
WBC: 5.2

## 2019-04-12 LAB — HEPATIC FUNCTION PANEL
ALT: 7 (ref 7–35)
AST: 13 (ref 13–35)
Alkaline Phosphatase: 70 (ref 25–125)
Bilirubin, Total: 0.4

## 2019-04-12 LAB — BASIC METABOLIC PANEL
BUN: 15 (ref 4–21)
CO2: 27 — AB (ref 13–22)
Chloride: 107 (ref 99–108)
Creatinine: 0.8 (ref 0.5–1.1)
Glucose: 84
Potassium: 4 (ref 3.4–5.3)
Sodium: 141 (ref 137–147)

## 2019-04-12 LAB — CBC: RBC: 3.71 — AB (ref 3.87–5.11)

## 2019-04-30 ENCOUNTER — Other Ambulatory Visit: Payer: Self-pay | Admitting: *Deleted

## 2019-04-30 MED ORDER — PREGABALIN 50 MG PO CAPS: 50.0000 mg | ORAL_CAPSULE | Freq: Every day | ORAL | 0 refills | Status: DC

## 2019-04-30 NOTE — Telephone Encounter (Signed)
Received Fax from [facility] and sent to [facility] for approval.

## 2019-06-02 ENCOUNTER — Other Ambulatory Visit: Payer: Self-pay | Admitting: Nurse Practitioner

## 2019-06-04 NOTE — Telephone Encounter (Signed)
Patient pharmacy request refill for medication "Pregabalin". Patient last seen 04/10/2019. Patient medication pend and sent to provider Mast, Man X, NP.

## 2019-06-19 ENCOUNTER — Non-Acute Institutional Stay: Payer: Medicare Other | Admitting: Internal Medicine

## 2019-06-19 ENCOUNTER — Encounter: Payer: Self-pay | Admitting: Internal Medicine

## 2019-06-19 DIAGNOSIS — D649 Anemia, unspecified: Secondary | ICD-10-CM

## 2019-06-19 DIAGNOSIS — M159 Polyosteoarthritis, unspecified: Secondary | ICD-10-CM

## 2019-06-19 DIAGNOSIS — M797 Fibromyalgia: Secondary | ICD-10-CM | POA: Diagnosis not present

## 2019-06-19 DIAGNOSIS — G8929 Other chronic pain: Secondary | ICD-10-CM

## 2019-06-19 DIAGNOSIS — M25562 Pain in left knee: Secondary | ICD-10-CM

## 2019-06-19 DIAGNOSIS — M8949 Other hypertrophic osteoarthropathy, multiple sites: Secondary | ICD-10-CM | POA: Diagnosis not present

## 2019-06-19 DIAGNOSIS — M810 Age-related osteoporosis without current pathological fracture: Secondary | ICD-10-CM | POA: Diagnosis not present

## 2019-06-19 DIAGNOSIS — J449 Chronic obstructive pulmonary disease, unspecified: Secondary | ICD-10-CM

## 2019-06-19 DIAGNOSIS — I10 Essential (primary) hypertension: Secondary | ICD-10-CM

## 2019-06-19 DIAGNOSIS — R442 Other hallucinations: Secondary | ICD-10-CM | POA: Diagnosis not present

## 2019-06-19 NOTE — Progress Notes (Signed)
Location:  [facility] Room Number: 917-A Place of Service:  ALF [phone]) Provider:  Veleta Miners, MD  Patient Care Team: Mast, Man X, NP as PCP - General (Internal Medicine) Mast, Man X, NP as Nurse Practitioner (Internal Medicine)  Extended Emergency Contact Information Primary Emergency Contact: Culbreth,Robin Address: [address] [place], [place] [address] [place] Phone: [phone] Mobile Phone: [phone] Relation: Daughter  Code Status:  FULL CODE Goals of care: Advanced Directive information Advanced Directives 04/10/2019  Does Patient Have a Medical Advance Directive? Yes  Type of Advance Directive Living will  Does patient want to make changes to medical advance directive? No - Patient declined  Copy of [place] in Chart? -  Would patient like information on creating a medical advance directive? -  Pre-existing out of facility DNR order (yellow form or pink MOST form) -     Chief Complaint  Patient presents with  . Medical Management of Chronic Issues    Routine [facility] AL visit    HPI:  Pt is an 86 y.o. female seen today for medical management of chronic diseases.    Patient is long term resident of AL unit in [place]. Patient has h/ofibromyalgia on Lyrica,hypertension, hyperlipidemia,anemia, osteoporosis,COPD , Insomnia and Tactile hallucinations but patient refuses to take any Meds for it  Doing well Her Only Complain was Left knee pain. It looked swollen but she said it is her arthritis and Alleve works for her. Does not want Xray. No Injury. Walks with her walker. Weight stable. No recent Halluciantions Past Medical History:  Diagnosis Date  . Acute blood loss anemia 08/23/2013   04/13/16 TSH 1.20, Na 141, K 4.2, Bun 15, creat 0.79, wbc 5.5, Hgb 11.5, plt 283  . Anxiety   . Anxiety state 07/02/2007   Qualifier: Diagnosis of  By: Lenna Gilford MD, Deborra Medina   .  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 Cigarette smoker   . Colon polyps 2009   TWO TUBULAR ADENOMAS AND HYPERPLASTIC POLYPS  . Constipation 09/14/2013  . COPD (chronic obstructive pulmonary disease) ([place])   . Diverticulosis of colon   . DJD (degenerative joint disease)   . Dog bite of limb 07/14/2010   Dog bite 07/10/10 - dogs shots utd Last td was 08/2009  Localized tx only.    . Fibromyalgia   . GERD 12/19/2006   Qualifier: Diagnosis of  By: Julien Girt CMA, Marliss Czar  04/13/16 TSH 1.20, Na 141, K 4.2, Bun 15, creat 0.79, wbc 5.5, Hgb 11.5, plt 283   . GERD (gastroesophageal reflux disease)   . Hammer toe of second toe of left foot 04/08/2016   Left 2nd  . Hearing loss   . Hemorrhoids   . Hypercholesterolemia   . Osteoarthritis 12/19/2006   Qualifier: Diagnosis of  By: Julien Girt CMA, Marliss Czar    . Osteoporosis    Past Surgical History:  Procedure Laterality Date  . CATARACT EXTRACTION, BILATERAL  2009  . COLONOSCOPY  2009, 2006 , 2017  . stapes surgery     Dr Thornell Mule  . TONSILLECTOMY AND ADENOIDECTOMY     as a child    Allergies  Allergen Reactions  . Penicillins Anaphylaxis  . Bisphosphonates Other (See Comments)    [name]  . Influenza Vaccines     Pt reported  . Simvastatin     Unknown   . Trazodone And Nefazodone  Felt her throat was closing up/kept her awake  . Sulfonamide Derivatives Rash and Other (See Comments)    blisters    Outpatient Encounter Medications as of 06/19/2019  Medication Sig  . calcium carbonate (TUMS EX) 750 MG chewable tablet Chew 750 mg by mouth daily.   . Calcium Carbonate-Vit D-Min (CALCIUM 600+D PLUS MINERALS) 600-400 MG-UNIT TABS Take 1 tablet by mouth daily.   . cholecalciferol (VITAMIN D) 1000 UNITS tablet Take 1,000 Units by mouth daily.  . cyclobenzaprine (FLEXERIL) 5 MG tablet Take 2.5 mg by mouth at bedtime.  Marland Kitchen estradiol (ESTRACE) 0.1 MG/GM vaginal cream Place 1 Applicatorful vaginally daily. On Mon,Wed,Fri  . hydrocortisone  cream 1 % Apply 1 application topically 2 (two) times daily as needed. Apply to wrists  . metoprolol tartrate (LOPRESSOR) 25 MG tablet Take 12.5 mg by mouth 2 (two) times daily.  . naproxen sodium (ALEVE) 220 MG tablet Take 220 mg by mouth daily as needed. Take 1 tablet at dinner  . nitrofurantoin (MACRODANTIN) 50 MG capsule Take 50 mg by mouth at bedtime.  . polyethylene glycol (MIRALAX / GLYCOLAX) 17 g packet Take 17 g by mouth daily as needed. This is in addition to the twice a week dosing.  . polyethylene glycol (MIRALAX / GLYCOLAX) packet Take 17 g by mouth daily as needed. Once a day on Sun,Wed  . pregabalin (LYRICA) 50 MG capsule TAKE 1 CAPSULE  BY MOUTH ONCE DAILY   No facility-administered encounter medications on file as of 06/19/2019.    Review of Systems  Review of Systems  Constitutional: Negative for activity change, appetite change, chills, diaphoresis, fatigue and fever.  HENT: Negative for mouth sores, postnasal drip, rhinorrhea, sinus pain and sore throat.   Respiratory: Negative for apnea, cough, chest tightness, shortness of breath and wheezing.   Cardiovascular: Negative for chest pain, palpitations and leg swelling.  Gastrointestinal: Negative for abdominal distention, abdominal pain, constipation, diarrhea, nausea and vomiting.  Genitourinary: Negative for dysuria and frequency.  Musculoskeletal: Positive for Knee pain Skin: Negative for rash.  Neurological: Negative for dizziness, syncope, weakness, light-headedness and numbness.  Psychiatric/Behavioral: Negative for behavioral problems, confusion and sleep disturbance.     Immunization History  Administered Date(s) Administered  . Moderna SARS-COVID-2 Vaccination 02/17/2019, 03/17/2019  . Pneumococcal Conjugate-13 09/01/2015  . Pneumococcal Polysaccharide-23 07/07/1998  . Td 08/21/2009   Pertinent  Health Maintenance Due  Topic Date Due  . PNA vac Low Risk Adult (2 of 2 - PPSV23) 08/31/2016  . DEXA SCAN   Completed  . INFLUENZA VACCINE  Discontinued   Fall Risk  11/08/2017 11/05/2016 09/01/2015 08/31/2012  Falls in the past year? No Yes Yes No  Number falls in past yr: - 2 or more 1 -  Injury with Fall? - Yes No -  Risk for fall due to : - - Impaired balance/gait -   Functional Status Survey:    Vitals:   06/19/19 1217  BP: 130/66  Pulse: 64  Resp: 18  Temp: 98 F (36.7 C)  TempSrc: Oral  SpO2: 95%  Weight: 129 lb (58.5 kg)  Height: 5\' 2"  (1.575 m)   Body mass index is 23.59 kg/m. Physical Exam  Constitutional: Oriented to person, place, and time. Well-developed and well-nourished.  HENT:  Head: Normocephalic.  Mouth/Throat: Oropharynx is clear and moist.  Eyes: Pupils are equal, round, and reactive to light.  Neck: Neck supple.  Cardiovascular: Normal rate and normal heart sounds.  No murmur heard. Pulmonary/Chest: Effort normal and breath  sounds normal. No respiratory distress. No wheezes. She has no rales.  Abdominal: Soft. Bowel sounds are normal. No distension. There is no tenderness. There is no rebound.  Musculoskeletal: No edema. Left Knee was Swollen but not Red or warm Lymphadenopathy: none Neurological: Alert and oriented to person, place, and time.  Skin: Skin is warm and dry.  Psychiatric: Normal mood and affect. Behavior is normal. Thought content normal.    Labs reviewed: Recent Labs    06/26/18 0000 10/26/18 0000 01/10/19 0000  NA 139 144 141  K 4.7 4.3 4.1  CL  --  108 107  CO2  --  30 27*  BUN 14 10 18   CREATININE 0.7 0.8 0.8  CALCIUM  --  9.3 8.8   Recent Labs    06/26/18 0000 10/26/18 0000 01/10/19 0000  AST 16 14 21   ALT 10 11 16   ALKPHOS  --  105 82  PROT  --  5.4*  --   ALBUMIN  --  3.6 3.6   Recent Labs    06/26/18 0000 10/26/18 0000 01/10/19 0000  WBC  --  5.8 5.1  NEUTROABS  --  3,103 2,678  HGB 12.2 10.9* 10.6*  HCT 36 33* 32*  PLT 274 251 195   Lab Results  Component Value Date   TSH 1.92 01/10/2019   Lab  Results  Component Value Date   HGBA1C 5.8 02/17/2017   Lab Results  Component Value Date   CHOL 177 01/10/2019   HDL 52 01/10/2019   LDLCALC 104 01/10/2019   LDLDIRECT 114.7 08/18/2011   TRIG 110 01/10/2019   CHOLHDL 2 01/23/2015    Significant Diagnostic Results in last 30 days:  No results found.  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 not had it recently  COPD (chronic obstructive pulmonary disease) with chronic bronchitis (HCC) Responded to Symbicort Not having symptoms anymore  Age-related osteoporosis without current pathological fracture Has refused treatment T scrore -2.8 in 2018 Anemia, unspecified type New Problem Iron Studies in 11/20 showed Sats of 20% but low Ferritin Can start on iron B12 lower side also Will start B12 also Not candidate for aggressive work up unless Hgb drops Also start on Omeprazole as taking Alleve Recurrent UTI Was seen By Dr Karsten Ro On Low dose of Nitrofurantoin  Family/ staff Communication:   Labs/tests ordered:

## 2019-07-04 ENCOUNTER — Other Ambulatory Visit: Payer: Self-pay | Admitting: *Deleted

## 2019-07-04 MED ORDER — PREGABALIN 50 MG PO CAPS: 50.0000 mg | ORAL_CAPSULE | Freq: Every day | ORAL | 0 refills | Status: DC

## 2019-07-04 NOTE — Telephone Encounter (Signed)
Received fax from [facility] °Pended Rx and sent to [facility] for approval.  °

## 2019-08-31 ENCOUNTER — Other Ambulatory Visit: Payer: Self-pay | Admitting: *Deleted

## 2019-08-31 MED ORDER — PREGABALIN 50 MG PO CAPS: 50.0000 mg | ORAL_CAPSULE | Freq: Every day | ORAL | 0 refills | Status: DC

## 2019-08-31 NOTE — Telephone Encounter (Signed)
Received request from [facility] and sent to [facility] for approval.

## 2019-10-01 ENCOUNTER — Encounter: Payer: Self-pay | Admitting: Nurse Practitioner

## 2019-10-01 ENCOUNTER — Other Ambulatory Visit: Payer: Self-pay

## 2019-10-01 ENCOUNTER — Non-Acute Institutional Stay: Payer: Medicare Other | Admitting: Nurse Practitioner

## 2019-10-01 DIAGNOSIS — N952 Postmenopausal atrophic vaginitis: Secondary | ICD-10-CM

## 2019-10-01 DIAGNOSIS — S51012A Laceration without foreign body of left elbow, initial encounter: Secondary | ICD-10-CM

## 2019-10-01 DIAGNOSIS — N390 Urinary tract infection, site not specified: Secondary | ICD-10-CM

## 2019-10-01 DIAGNOSIS — I10 Essential (primary) hypertension: Secondary | ICD-10-CM | POA: Diagnosis not present

## 2019-10-01 DIAGNOSIS — M8949 Other hypertrophic osteoarthropathy, multiple sites: Secondary | ICD-10-CM | POA: Diagnosis not present

## 2019-10-01 DIAGNOSIS — S51019A Laceration without foreign body of unspecified elbow, initial encounter: Secondary | ICD-10-CM | POA: Insufficient documentation

## 2019-10-01 DIAGNOSIS — M159 Polyosteoarthritis, unspecified: Secondary | ICD-10-CM

## 2019-10-01 MED ORDER — PREGABALIN 50 MG PO CAPS: 50.0000 mg | ORAL_CAPSULE | Freq: Every day | ORAL | 0 refills | Status: DC

## 2019-10-01 NOTE — Assessment & Plan Note (Addendum)
sustained left elbow skin tear form falling off a low bench in her room when she dozed off,  well approximated with steri strips, no s/s of active bleeding or infection. The patient is aware of safety measures, will update CBC/diff, CMP/eGFR

## 2019-10-01 NOTE — Progress Notes (Signed)
Location:   [place] Room Number: 354 Place of Service:  ALF (13) Provider:  Marlana Latus NP   Gregory Dowe X, NP  Patient Care Team: Anokhi Shannon X, NP as PCP - General (Internal Medicine) Mariaguadalupe Fialkowski X, NP as Nurse Practitioner (Internal Medicine)  Extended Emergency Contact Information Primary Emergency Contact: Culbreth,Robin Address: [address] [place], [place] [address] [place] Phone: [phone] Mobile Phone: [phone] Relation: Daughter  Code Status:  Full Code Goals of care: Advanced Directive information Advanced Directives 04/10/2019  Does Patient Have a Medical Advance Directive? Yes  Type of Advance Directive Living will  Does patient want to make changes to medical advance directive? No - Patient declined  Copy of [place] in Chart? -  Would patient like information on creating a medical advance directive? -  Pre-existing out of facility DNR order (yellow form or pink MOST form) -     Chief Complaint  Patient presents with  . Acute Visit    left elbow skin tear, fall    HPI:  Pt is a 86 y.o. female seen today for an acute visit for sustained left elbow skin tear form falling off a low bench in her room when she dozed off,  well approximated with steri strips, no s/s of active bleeding or infection.    Chronic lower back, leg pain, on Felxeril 2.30m qhs, Lyrica 559mqd.  HTN, blood pressure is controlled on Metoprolol 12.60m42mid.  Urinary symptoms, stable on Nitrofurantoin 83m79m for UTI suppression  Estrace vaginal cream for atrophic vaginitis.  Past Medical History:  Diagnosis Date  . Acute blood loss anemia 08/23/2013   04/13/16 TSH 1.20, Na 141, K 4.2, Bun 15, creat 0.79, wbc 5.5, Hgb 11.5, plt 283  . Anxiety   . 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 Cigarette smoker   . Colon  polyps 2009   TWO TUBULAR ADENOMAS AND HYPERPLASTIC POLYPS  . Constipation 09/14/2013  . COPD (chronic obstructive pulmonary disease) (HCC)[place]. Diverticulosis of colon   . DJD (degenerative joint disease)   . Dog bite of limb 07/14/2010   Dog bite 07/10/10 - dogs shots utd Last td was 08/2009  Localized tx only.    . Fibromyalgia   . GERD 12/19/2006   Qualifier: Diagnosis of  By: AdkiJulien Girt, LeigMarliss Czar27/18 TSH 1.20, Na 141, K 4.2, Bun 15, creat 0.79, wbc 5.5, Hgb 11.5, plt 283   . GERD (gastroesophageal reflux disease)   . Hammer toe of second toe of left foot 04/08/2016   Left 2nd  . Hearing loss   . Hemorrhoids   . Hypercholesterolemia   . Osteoarthritis 12/19/2006   Qualifier: Diagnosis of  By: AdkiJulien Girt, LeigMarliss Czar. Osteoporosis    Past Surgical History:  Procedure Laterality Date  . CATARACT EXTRACTION, BILATERAL  2009  . COLONOSCOPY  2009, 2006 , 2017  . stapes surgery     Dr KrauThornell MuleTONSILLECTOMY AND ADENOIDECTOMY     as a child    Allergies  Allergen Reactions  . Penicillins Anaphylaxis  . Bisphosphonates Other (See Comments)    [name]  . Influenza Vaccines     Pt reported  . Simvastatin     Unknown   . Trazodone  And Nefazodone     Felt her throat was closing up/kept her awake  . Sulfonamide Derivatives Rash and Other (See Comments)    blisters    Allergies as of 10/01/2019      Reactions   Penicillins Anaphylaxis   Bisphosphonates Other (See Comments)   [name]   Influenza Vaccines    Pt reported   Simvastatin    Unknown    Trazodone And Nefazodone    Felt her throat was closing up/kept her awake   Sulfonamide Derivatives Rash, Other (See Comments)   blisters      Medication List       Accurate as of October 01, 2019  4:36 PM. If you have any questions, ask your nurse or doctor.        Calcium 600+D Plus Minerals 600-400 MG-UNIT Tabs Take 1 tablet by mouth daily.   calcium carbonate 750 MG chewable tablet Commonly known as:  TUMS EX Chew 750 mg by mouth daily.   cholecalciferol 1000 units tablet Commonly known as: VITAMIN D Take 1,000 Units by mouth daily.   cyclobenzaprine 5 MG tablet Commonly known as: FLEXERIL Take 2.5 mg by mouth at bedtime.   estradiol 0.1 MG/GM vaginal cream Commonly known as: ESTRACE Place 1 Applicatorful vaginally daily. On Mon,Wed,Fri   hydrocortisone cream 1 % Apply 1 application topically 2 (two) times daily as needed. Apply to wrists   metoprolol tartrate 25 MG tablet Commonly known as: LOPRESSOR Take 12.5 mg by mouth 2 (two) times daily.   naproxen sodium 220 MG tablet Commonly known as: ALEVE Take 220 mg by mouth daily as needed. Take 1 tablet at dinner   nitrofurantoin 50 MG capsule Commonly known as: MACRODANTIN Take 50 mg by mouth at bedtime.   polyethylene glycol 17 g packet Commonly known as: MIRALAX / GLYCOLAX Take 17 g by mouth daily as needed. Once a day on Sun,Wed   polyethylene glycol 17 g packet Commonly known as: MIRALAX / GLYCOLAX Take 17 g by mouth daily as needed. This is in addition to the twice a week dosing.   pregabalin 50 MG capsule Commonly known as: LYRICA Take 1 capsule (50 mg total) by mouth daily.   vitamin B-12 1000 MCG tablet Commonly known as: CYANOCOBALAMIN Take 1,000 mcg by mouth daily.       Review of Systems  Constitutional: Negative for activity change, appetite change and fever.  HENT: Positive for hearing loss. Negative for congestion and voice change.   Eyes: Negative for visual disturbance.  Respiratory: Negative for cough, shortness of breath and wheezing.   Cardiovascular: Negative for chest pain, palpitations and leg swelling.  Gastrointestinal: Negative for abdominal pain, constipation, nausea and vomiting.  Genitourinary: Negative for difficulty urinating, dysuria and urgency.  Musculoskeletal: Positive for arthralgias, back pain, gait problem and myalgias.  Skin: Positive for wound. Negative for color  change and pallor.  Neurological: Negative for speech difficulty, weakness, light-headedness and headaches.  Psychiatric/Behavioral: Positive for sleep disturbance. Negative for behavioral problems and hallucinations. The patient is not nervous/anxious.     Immunization History  Administered Date(s) Administered  . Moderna SARS-COVID-2 Vaccination 02/17/2019, 03/17/2019  . Pneumococcal Conjugate-13 09/01/2015  . Pneumococcal Polysaccharide-23 07/07/1998  . Td 08/21/2009   Pertinent  Health Maintenance Due  Topic Date Due  . PNA vac Low Risk Adult (2 of 2 - PPSV23) 08/31/2016  . DEXA SCAN  Completed  . INFLUENZA VACCINE  Discontinued   Fall Risk  11/08/2017 11/05/2016 09/01/2015 08/31/2012  Falls in the past year? No Yes Yes No  Number falls in past yr: - 2 or more 1 -  Injury with Fall? - Yes No -  Risk for fall due to : - - Impaired balance/gait -   Functional Status Survey:    Vitals:   10/01/19 1141  BP: (!) 142/76  Pulse: 64  Resp: 18  Temp: 99.2 F (37.3 C)  SpO2: 96%  Weight: 132 lb (59.9 kg)  Height: 5' 1"  (1.549 m)   Body mass index is 24.94 kg/m. Physical Exam Vitals and nursing note reviewed.  Constitutional:      Appearance: Normal appearance.  HENT:     Head: Normocephalic and atraumatic.     Nose: Nose normal.     Mouth/Throat:     Mouth: Mucous membranes are moist.  Eyes:     Extraocular Movements: Extraocular movements intact.     Conjunctiva/sclera: Conjunctivae normal.     Pupils: Pupils are equal, round, and reactive to light.  Cardiovascular:     Rate and Rhythm: Normal rate and regular rhythm.     Heart sounds: No murmur heard.   Pulmonary:     Breath sounds: No wheezing, rhonchi or rales.     Comments: Decreased air entry to both lungs.  Abdominal:     General: Bowel sounds are normal.     Palpations: Abdomen is soft.     Tenderness: There is abdominal tenderness. There is rebound.  Musculoskeletal:     Cervical back: Normal range of  motion and neck supple.     Right lower leg: No edema.     Left lower leg: No edema.  Skin:    General: Skin is warm and dry.     Comments: Let elbow skin tear, well approximated with steri strips, no active bleeding or s/s of infection.   Neurological:     General: No focal deficit present.     Mental Status: She is alert. Mental status is at baseline.     Motor: No weakness.     Coordination: Coordination normal.     Gait: Gait abnormal.     Comments: Oriented to person, place.   Psychiatric:        Mood and Affect: Mood normal.        Behavior: Behavior normal.     Labs reviewed: Recent Labs    10/26/18 0000 01/10/19 0000 04/12/19 0000  NA 144 141 141  K 4.3 4.1 4.0  CL 108 107 107  CO2 30 27* 27*  BUN 10 18 15   CREATININE 0.8 0.8 0.8  CALCIUM 9.3 8.8 8.9   Recent Labs    10/26/18 0000 01/10/19 0000 04/12/19 0000  AST 14 21 13   ALT 11 16 7   ALKPHOS 105 82 70  PROT 5.4*  --   --   ALBUMIN 3.6 3.6 3.5   Recent Labs    10/26/18 0000 01/10/19 0000 04/12/19 0000  WBC 5.8 5.1 5.2  NEUTROABS 3,103 2,678 2,647  HGB 10.9* 10.6* 10.9*  HCT 33* 32* 33*  PLT 251 195 213   Lab Results  Component Value Date   TSH 1.92 01/10/2019   Lab Results  Component Value Date   HGBA1C 5.8 02/17/2017   Lab Results  Component Value Date   CHOL 177 01/10/2019   HDL 52 01/10/2019   LDLCALC 104 01/10/2019   LDLDIRECT 114.7 08/18/2011   TRIG 110 01/10/2019   CHOLHDL 2 01/23/2015    Significant Diagnostic Results  in last 30 days:  No results found.  Assessment/Plan Skin tear of elbow without complication sustained left elbow skin tear form falling off a low bench in her room when she dozed off,  well approximated with steri strips, no s/s of active bleeding or infection. The patient is aware of safety measures, will update CBC/diff, CMP/eGFR   Osteoarthritis Chronic lower back, leg pain, on Felxeril 2.56m qhs, Lyrica 528mqd.   Hypertension Blood pressure is  controlled, continue Metoprolol.   UTI (urinary tract infection) Continue Nitrofurantoin for UTI suppression therapy.   Atrophic vaginitis Stable, continue Estrace.      Family/ staff Communication: plan of care reviewed with the patient and charge nurse.   Labs/tests ordered:  CBC/diff, CMP/eGFR  Time spend 40 minutes.

## 2019-10-01 NOTE — Assessment & Plan Note (Signed)
Stable, continue Estrace.

## 2019-10-01 NOTE — Assessment & Plan Note (Signed)
Continue Nitrofurantoin for UTI suppression therapy.

## 2019-10-01 NOTE — Assessment & Plan Note (Signed)
Blood pressure is controlled, continue Metoprolol. 

## 2019-10-01 NOTE — Assessment & Plan Note (Signed)
Chronic lower back, leg pain, on Felxeril 2.5mg  qhs, Lyrica 50mg  qd.

## 2019-10-02 DIAGNOSIS — I10 Essential (primary) hypertension: Secondary | ICD-10-CM | POA: Diagnosis not present

## 2019-10-02 DIAGNOSIS — D649 Anemia, unspecified: Secondary | ICD-10-CM | POA: Diagnosis not present

## 2019-10-02 LAB — COMPREHENSIVE METABOLIC PANEL
Albumin: 3.7 (ref 3.5–5.0)
Calcium: 9.2 (ref 8.7–10.7)
Globulin: 1.6

## 2019-10-02 LAB — CBC AND DIFFERENTIAL
HCT: 32 — AB (ref 36–46)
Hemoglobin: 10.9 — AB (ref 12.0–16.0)
Neutrophils Absolute: 2157
Platelets: 182 (ref 150–399)
WBC: 4.6

## 2019-10-02 LAB — BASIC METABOLIC PANEL
BUN: 15 (ref 4–21)
CO2: 30 — AB (ref 13–22)
Chloride: 106 (ref 99–108)
Creatinine: 0.7 (ref 0.5–1.1)
Glucose: 83
Potassium: 4.1 (ref 3.4–5.3)
Sodium: 141 (ref 137–147)

## 2019-10-02 LAB — HEPATIC FUNCTION PANEL
ALT: 10 (ref 7–35)
AST: 15 (ref 13–35)
Alkaline Phosphatase: 71 (ref 25–125)
Bilirubin, Total: 0.5

## 2019-10-02 LAB — CBC: RBC: 3.54 — AB (ref 3.87–5.11)

## 2019-10-15 ENCOUNTER — Encounter: Payer: Self-pay | Admitting: Nurse Practitioner

## 2019-10-15 ENCOUNTER — Non-Acute Institutional Stay: Payer: Medicare Other | Admitting: Nurse Practitioner

## 2019-10-15 DIAGNOSIS — M8949 Other hypertrophic osteoarthropathy, multiple sites: Secondary | ICD-10-CM | POA: Diagnosis not present

## 2019-10-15 DIAGNOSIS — D649 Anemia, unspecified: Secondary | ICD-10-CM

## 2019-10-15 DIAGNOSIS — N390 Urinary tract infection, site not specified: Secondary | ICD-10-CM

## 2019-10-15 DIAGNOSIS — N952 Postmenopausal atrophic vaginitis: Secondary | ICD-10-CM | POA: Diagnosis not present

## 2019-10-15 DIAGNOSIS — I10 Essential (primary) hypertension: Secondary | ICD-10-CM | POA: Diagnosis not present

## 2019-10-15 DIAGNOSIS — M159 Polyosteoarthritis, unspecified: Secondary | ICD-10-CM

## 2019-10-15 NOTE — Progress Notes (Signed)
Location:   [place] Room Number: 025 Place of Service:  ALF (13) Provider:  Marlana Latus NP  Durward Matranga X, NP  Patient Care Team: Kemoni Ortega X, NP as PCP - General (Internal Medicine) Wallis Spizzirri X, NP as Nurse Practitioner (Internal Medicine)  Extended Emergency Contact Information Primary Emergency Contact: Culbreth,Robin Address: [address] [place], [place] [address] [place] Phone: [phone] Mobile Phone: [phone] Relation: Daughter  Code Status:  Full Code Goals of care: Advanced Directive information Advanced Directives 10/15/2019  Does Patient Have a Medical Advance Directive? Yes  Type of Advance Directive Living will  Does patient want to make changes to medical advance directive? No - Patient declined  Copy of [place] in Chart? -  Would patient like information on creating a medical advance directive? -  Pre-existing out of facility DNR order (yellow form or pink MOST form) -     Chief Complaint  Patient presents with  . Medical Management of Chronic Issues  . Health Maintenance    PPSV23, TDAP    HPI:  Pt is a 86 y.o. female seen today for medical management of chronic diseases.    Chronic lower back, leg pain, on Flexeril 2.5mg  qhs, Lyrica 50mg  qd.             HTN, blood pressure is controlled on Metoprolol 12.5mg  bid.            Urinary symptoms, stable on Nitrofurantoin 50mg  qd for UTI suppression             Estrace vaginal cream for atrophic vaginitis.  Anemia, baseline Hgb 10s, takes Vit B12   Past Medical History:  Diagnosis Date  . Acute blood loss anemia 08/23/2013   04/13/16 TSH 1.20, Na 141, K 4.2, Bun 15, creat 0.79, wbc 5.5, Hgb 11.5, plt 283  . Anxiety   . Anxiety state 07/02/2007   Qualifier: Diagnosis of  By: Lenna Gilford MD, Deborra Medina   . 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 Cigarette smoker   . Colon polyps 2009   TWO TUBULAR ADENOMAS AND  HYPERPLASTIC POLYPS  . Constipation 09/14/2013  . COPD (chronic obstructive pulmonary disease) ([facility])   . Diverticulosis of colon   . DJD (degenerative joint disease)   . Dog bite of limb 07/14/2010   Dog bite 07/10/10 - dogs shots utd Last td was 08/2009  Localized tx only.    . Fibromyalgia   . GERD 12/19/2006   Qualifier: Diagnosis of  By: Julien Girt CMA, Marliss Czar  04/13/16 TSH 1.20, Na 141, K 4.2, Bun 15, creat 0.79, wbc 5.5, Hgb 11.5, plt 283   . GERD (gastroesophageal reflux disease)   . Hammer toe of second toe of left foot 04/08/2016   Left 2nd  . Hearing loss   . Hemorrhoids   . Hypercholesterolemia   . Osteoarthritis 12/19/2006   Qualifier: Diagnosis of  By: Julien Girt CMA, Marliss Czar    . Osteoporosis    Past Surgical History:  Procedure Laterality Date  . CATARACT EXTRACTION, BILATERAL  2009  . COLONOSCOPY  2009, 2006 , 2017  . stapes surgery     Dr Thornell Mule  . TONSILLECTOMY AND ADENOIDECTOMY     as a child    Allergies  Allergen Reactions  . Penicillins Anaphylaxis  . Bisphosphonates Other (See Comments)    [name]  . Influenza Vaccines  Pt reported  . Simvastatin     Unknown   . Trazodone And Nefazodone     Felt her throat was closing up/kept her awake  . Sulfonamide Derivatives Rash and Other (See Comments)    blisters    Allergies as of 10/15/2019      Reactions   Penicillins Anaphylaxis   Bisphosphonates Other (See Comments)   [name]   Influenza Vaccines    Pt reported   Simvastatin    Unknown    Trazodone And Nefazodone    Felt her throat was closing up/kept her awake   Sulfonamide Derivatives Rash, Other (See Comments)   blisters      Medication List       Accurate as of October 15, 2019 11:59 PM. If you have any questions, ask your nurse or doctor.        Calcium 600+D Plus Minerals 600-400 MG-UNIT Tabs Take 1 tablet by mouth daily.   calcium carbonate 750 MG chewable tablet Commonly known as: TUMS EX Chew 750 mg by mouth daily.     cholecalciferol 1000 units tablet Commonly known as: VITAMIN D Take 1,000 Units by mouth daily.   cyclobenzaprine 5 MG tablet Commonly known as: FLEXERIL Take 2.5 mg by mouth at bedtime.   estradiol 0.1 MG/GM vaginal cream Commonly known as: ESTRACE Place 1 Applicatorful vaginally daily. On Mon,Wed,Fri   hydrocortisone cream 1 % Apply 1 application topically 2 (two) times daily as needed. Apply to wrists   metoprolol tartrate 25 MG tablet Commonly known as: LOPRESSOR Take 12.5 mg by mouth 2 (two) times daily.   naproxen sodium 220 MG tablet Commonly known as: ALEVE Take 220 mg by mouth daily as needed. Take 1 tablet at dinner   nitrofurantoin 50 MG capsule Commonly known as: MACRODANTIN Take 50 mg by mouth at bedtime.   polyethylene glycol 17 g packet Commonly known as: MIRALAX / GLYCOLAX Take 17 g by mouth daily as needed. Once a day on Sun,Wed   polyethylene glycol 17 g packet Commonly known as: MIRALAX / GLYCOLAX Take 17 g by mouth daily as needed. This is in addition to the twice a week dosing.   pregabalin 50 MG capsule Commonly known as: LYRICA Take 1 capsule (50 mg total) by mouth daily.   vitamin B-12 1000 MCG tablet Commonly known as: CYANOCOBALAMIN Take 1,000 mcg by mouth daily.       Review of Systems  Constitutional: Negative for fatigue, fever and unexpected weight change.  HENT: Positive for hearing loss. Negative for congestion and voice change.   Eyes: Negative for visual disturbance.  Respiratory: Negative for cough, shortness of breath and wheezing.   Cardiovascular: Negative for leg swelling.  Gastrointestinal: Negative for abdominal pain and constipation.  Genitourinary: Negative for dysuria and urgency.  Musculoskeletal: Positive for arthralgias, back pain, gait problem and myalgias.  Skin: Positive for wound. Negative for color change and pallor.  Neurological: Negative for dizziness, speech difficulty and weakness.   Psychiatric/Behavioral: Positive for sleep disturbance. Negative for behavioral problems and hallucinations. The patient is not nervous/anxious.        Chronic going to bed late, sleeping in late    Immunization History  Administered Date(s) Administered  . Moderna SARS-COVID-2 Vaccination 02/17/2019, 03/17/2019  . Pneumococcal Conjugate-13 09/01/2015  . Pneumococcal Polysaccharide-23 07/07/1998  . Td 08/21/2009   Pertinent  Health Maintenance Due  Topic Date Due  . PNA vac Low Risk Adult (2 of 2 - PPSV23) 08/31/2016  .  DEXA SCAN  Completed  . INFLUENZA VACCINE  Discontinued   Fall Risk  11/08/2017 11/05/2016 09/01/2015 08/31/2012  Falls in the past year? No Yes Yes No  Number falls in past yr: - 2 or more 1 -  Injury with Fall? - Yes No -  Risk for fall due to : - - Impaired balance/gait -   Functional Status Survey:    Vitals:   10/15/19 0927  BP: 126/70  Pulse: 72  Resp: 20  Temp: (!) 97.4 F (36.3 C)  SpO2: 96%  Weight: 132 lb (59.9 kg)  Height: 5\' 1"  (1.549 m)   Body mass index is 24.94 kg/m. Physical Exam Vitals and nursing note reviewed.  Constitutional:      Appearance: Normal appearance.  HENT:     Head: Normocephalic and atraumatic.     Mouth/Throat:     Mouth: Mucous membranes are moist.  Eyes:     Extraocular Movements: Extraocular movements intact.     Conjunctiva/sclera: Conjunctivae normal.     Pupils: Pupils are equal, round, and reactive to light.  Cardiovascular:     Rate and Rhythm: Normal rate and regular rhythm.     Heart sounds: No murmur heard.   Pulmonary:     Breath sounds: No rales.     Comments: Decreased air entry to both lungs.  Abdominal:     General: Bowel sounds are normal.     Palpations: Abdomen is soft.     Tenderness: There is no abdominal tenderness.  Musculoskeletal:     Cervical back: Normal range of motion and neck supple.     Right lower leg: No edema.     Left lower leg: No edema.  Skin:    General: Skin is  warm and dry.  Neurological:     General: No focal deficit present.     Mental Status: She is alert. Mental status is at baseline.     Motor: No weakness.     Coordination: Coordination normal.     Gait: Gait abnormal.     Comments: Oriented to person, place.   Psychiatric:        Mood and Affect: Mood normal.        Behavior: Behavior normal.     Labs reviewed: Recent Labs    01/10/19 0000 04/12/19 0000 10/02/19 0000  NA 141 141 141  K 4.1 4.0 4.1  CL 107 107 106  CO2 27* 27* 30*  BUN 18 15 15   CREATININE 0.8 0.8 0.7  CALCIUM 8.8 8.9 9.2   Recent Labs    10/26/18 0000 10/26/18 0000 01/10/19 0000 04/12/19 0000 10/02/19 0000  AST 14   < > 21 13 15   ALT 11   < > 16 7 10   ALKPHOS 105   < > 82 70 71  PROT 5.4*  --   --   --   --   ALBUMIN 3.6  --  3.6 3.5 3.7   < > = values in this interval not displayed.   Recent Labs    01/10/19 0000 04/12/19 0000 10/02/19 0000  WBC 5.1 5.2 4.6  NEUTROABS 2,678 2,647 2,157  HGB 10.6* 10.9* 10.9*  HCT 32* 33* 32*  PLT 195 213 182   Lab Results  Component Value Date   TSH 1.92 01/10/2019   Lab Results  Component Value Date   HGBA1C 5.8 02/17/2017   Lab Results  Component Value Date   CHOL 177 01/10/2019   HDL 52 01/10/2019  LDLCALC 104 01/10/2019   LDLDIRECT 114.7 08/18/2011   TRIG 110 01/10/2019   CHOLHDL 2 01/23/2015    Significant Diagnostic Results in last 30 days:  No results found.  Assessment/Plan Hypertension Blood pressure is controlled, continue Metoprolol   Osteoarthritis Lower back, legs, pain is controlled, continue Flexeril, Lyrica.   UTI (urinary tract infection) Continue Nitrofurantoin for urinary tract symptoms, UTI suppression tx  Atrophic vaginitis Stable, continue Estrace PV  Chronic anemia Stable, baseline Hgb 10s, continue Vit B12     Family/ staff Communication: plan of care reviewed with the patient and charge nurse.   Labs/tests ordered:  none  Time spend 40  minutes.

## 2019-10-16 ENCOUNTER — Encounter: Payer: Self-pay | Admitting: Nurse Practitioner

## 2019-10-16 DIAGNOSIS — D649 Anemia, unspecified: Secondary | ICD-10-CM | POA: Insufficient documentation

## 2019-10-16 NOTE — Assessment & Plan Note (Signed)
Blood pressure is controlled, continue Metoprolol. 

## 2019-10-16 NOTE — Assessment & Plan Note (Signed)
Lower back, legs, pain is controlled, continue Flexeril, Lyrica.

## 2019-10-16 NOTE — Assessment & Plan Note (Signed)
Stable, baseline Hgb 10s, continue Vit B12

## 2019-10-16 NOTE — Assessment & Plan Note (Signed)
Continue Nitrofurantoin for urinary tract symptoms, UTI suppression tx

## 2019-10-16 NOTE — Assessment & Plan Note (Signed)
Stable, continue Estrace PV

## 2019-10-18 DIAGNOSIS — R41841 Cognitive communication deficit: Secondary | ICD-10-CM | POA: Diagnosis not present

## 2019-10-18 DIAGNOSIS — M797 Fibromyalgia: Secondary | ICD-10-CM | POA: Diagnosis not present

## 2019-10-18 DIAGNOSIS — R2681 Unsteadiness on feet: Secondary | ICD-10-CM | POA: Diagnosis not present

## 2019-10-18 DIAGNOSIS — K219 Gastro-esophageal reflux disease without esophagitis: Secondary | ICD-10-CM | POA: Diagnosis not present

## 2019-10-18 DIAGNOSIS — D649 Anemia, unspecified: Secondary | ICD-10-CM | POA: Diagnosis not present

## 2019-10-18 DIAGNOSIS — M6281 Muscle weakness (generalized): Secondary | ICD-10-CM | POA: Diagnosis not present

## 2019-10-18 DIAGNOSIS — F411 Generalized anxiety disorder: Secondary | ICD-10-CM | POA: Diagnosis not present

## 2019-10-18 DIAGNOSIS — Z9181 History of falling: Secondary | ICD-10-CM | POA: Diagnosis not present

## 2019-10-18 DIAGNOSIS — R296 Repeated falls: Secondary | ICD-10-CM | POA: Diagnosis not present

## 2019-10-18 DIAGNOSIS — E7800 Pure hypercholesterolemia, unspecified: Secondary | ICD-10-CM | POA: Diagnosis not present

## 2019-10-18 DIAGNOSIS — K5900 Constipation, unspecified: Secondary | ICD-10-CM | POA: Diagnosis not present

## 2019-10-18 DIAGNOSIS — R1312 Dysphagia, oropharyngeal phase: Secondary | ICD-10-CM | POA: Diagnosis not present

## 2019-10-18 DIAGNOSIS — H9193 Unspecified hearing loss, bilateral: Secondary | ICD-10-CM | POA: Diagnosis not present

## 2019-10-18 DIAGNOSIS — M810 Age-related osteoporosis without current pathological fracture: Secondary | ICD-10-CM | POA: Diagnosis not present

## 2019-10-18 DIAGNOSIS — R443 Hallucinations, unspecified: Secondary | ICD-10-CM | POA: Diagnosis not present

## 2019-10-23 DIAGNOSIS — R2681 Unsteadiness on feet: Secondary | ICD-10-CM | POA: Diagnosis not present

## 2019-10-23 DIAGNOSIS — M6281 Muscle weakness (generalized): Secondary | ICD-10-CM | POA: Diagnosis not present

## 2019-10-23 DIAGNOSIS — M797 Fibromyalgia: Secondary | ICD-10-CM | POA: Diagnosis not present

## 2019-10-23 DIAGNOSIS — R296 Repeated falls: Secondary | ICD-10-CM | POA: Diagnosis not present

## 2019-10-23 DIAGNOSIS — F411 Generalized anxiety disorder: Secondary | ICD-10-CM | POA: Diagnosis not present

## 2019-10-23 DIAGNOSIS — D649 Anemia, unspecified: Secondary | ICD-10-CM | POA: Diagnosis not present

## 2019-10-24 DIAGNOSIS — M797 Fibromyalgia: Secondary | ICD-10-CM | POA: Diagnosis not present

## 2019-10-24 DIAGNOSIS — R296 Repeated falls: Secondary | ICD-10-CM | POA: Diagnosis not present

## 2019-10-24 DIAGNOSIS — D649 Anemia, unspecified: Secondary | ICD-10-CM | POA: Diagnosis not present

## 2019-10-24 DIAGNOSIS — F411 Generalized anxiety disorder: Secondary | ICD-10-CM | POA: Diagnosis not present

## 2019-10-24 DIAGNOSIS — M6281 Muscle weakness (generalized): Secondary | ICD-10-CM | POA: Diagnosis not present

## 2019-10-24 DIAGNOSIS — R2681 Unsteadiness on feet: Secondary | ICD-10-CM | POA: Diagnosis not present

## 2019-10-25 DIAGNOSIS — R2681 Unsteadiness on feet: Secondary | ICD-10-CM | POA: Diagnosis not present

## 2019-10-25 DIAGNOSIS — D649 Anemia, unspecified: Secondary | ICD-10-CM | POA: Diagnosis not present

## 2019-10-25 DIAGNOSIS — F411 Generalized anxiety disorder: Secondary | ICD-10-CM | POA: Diagnosis not present

## 2019-10-25 DIAGNOSIS — M797 Fibromyalgia: Secondary | ICD-10-CM | POA: Diagnosis not present

## 2019-10-25 DIAGNOSIS — M6281 Muscle weakness (generalized): Secondary | ICD-10-CM | POA: Diagnosis not present

## 2019-10-25 DIAGNOSIS — R296 Repeated falls: Secondary | ICD-10-CM | POA: Diagnosis not present

## 2019-10-30 ENCOUNTER — Other Ambulatory Visit: Payer: Self-pay | Admitting: *Deleted

## 2019-10-30 DIAGNOSIS — M6281 Muscle weakness (generalized): Secondary | ICD-10-CM | POA: Diagnosis not present

## 2019-10-30 DIAGNOSIS — M797 Fibromyalgia: Secondary | ICD-10-CM | POA: Diagnosis not present

## 2019-10-30 DIAGNOSIS — R2681 Unsteadiness on feet: Secondary | ICD-10-CM | POA: Diagnosis not present

## 2019-10-30 DIAGNOSIS — F411 Generalized anxiety disorder: Secondary | ICD-10-CM | POA: Diagnosis not present

## 2019-10-30 DIAGNOSIS — R296 Repeated falls: Secondary | ICD-10-CM | POA: Diagnosis not present

## 2019-10-30 DIAGNOSIS — D649 Anemia, unspecified: Secondary | ICD-10-CM | POA: Diagnosis not present

## 2019-10-30 MED ORDER — PREGABALIN 50 MG PO CAPS: 50.0000 mg | ORAL_CAPSULE | Freq: Every day | ORAL | 3 refills | Status: DC

## 2019-10-30 NOTE — Telephone Encounter (Signed)
Received fax from [facility] and sent to Dr. Gupta for approval.  

## 2019-10-31 DIAGNOSIS — R296 Repeated falls: Secondary | ICD-10-CM | POA: Diagnosis not present

## 2019-10-31 DIAGNOSIS — M797 Fibromyalgia: Secondary | ICD-10-CM | POA: Diagnosis not present

## 2019-10-31 DIAGNOSIS — M6281 Muscle weakness (generalized): Secondary | ICD-10-CM | POA: Diagnosis not present

## 2019-10-31 DIAGNOSIS — D649 Anemia, unspecified: Secondary | ICD-10-CM | POA: Diagnosis not present

## 2019-10-31 DIAGNOSIS — R2681 Unsteadiness on feet: Secondary | ICD-10-CM | POA: Diagnosis not present

## 2019-10-31 DIAGNOSIS — F411 Generalized anxiety disorder: Secondary | ICD-10-CM | POA: Diagnosis not present

## 2019-11-02 DIAGNOSIS — R2681 Unsteadiness on feet: Secondary | ICD-10-CM | POA: Diagnosis not present

## 2019-11-02 DIAGNOSIS — D649 Anemia, unspecified: Secondary | ICD-10-CM | POA: Diagnosis not present

## 2019-11-02 DIAGNOSIS — M6281 Muscle weakness (generalized): Secondary | ICD-10-CM | POA: Diagnosis not present

## 2019-11-02 DIAGNOSIS — F411 Generalized anxiety disorder: Secondary | ICD-10-CM | POA: Diagnosis not present

## 2019-11-02 DIAGNOSIS — M797 Fibromyalgia: Secondary | ICD-10-CM | POA: Diagnosis not present

## 2019-11-02 DIAGNOSIS — R296 Repeated falls: Secondary | ICD-10-CM | POA: Diagnosis not present

## 2019-11-06 DIAGNOSIS — R296 Repeated falls: Secondary | ICD-10-CM | POA: Diagnosis not present

## 2019-11-06 DIAGNOSIS — M6281 Muscle weakness (generalized): Secondary | ICD-10-CM | POA: Diagnosis not present

## 2019-11-06 DIAGNOSIS — F411 Generalized anxiety disorder: Secondary | ICD-10-CM | POA: Diagnosis not present

## 2019-11-06 DIAGNOSIS — D649 Anemia, unspecified: Secondary | ICD-10-CM | POA: Diagnosis not present

## 2019-11-06 DIAGNOSIS — R2681 Unsteadiness on feet: Secondary | ICD-10-CM | POA: Diagnosis not present

## 2019-11-06 DIAGNOSIS — M797 Fibromyalgia: Secondary | ICD-10-CM | POA: Diagnosis not present

## 2019-11-07 DIAGNOSIS — M797 Fibromyalgia: Secondary | ICD-10-CM | POA: Diagnosis not present

## 2019-11-07 DIAGNOSIS — D649 Anemia, unspecified: Secondary | ICD-10-CM | POA: Diagnosis not present

## 2019-11-07 DIAGNOSIS — R296 Repeated falls: Secondary | ICD-10-CM | POA: Diagnosis not present

## 2019-11-07 DIAGNOSIS — R2681 Unsteadiness on feet: Secondary | ICD-10-CM | POA: Diagnosis not present

## 2019-11-07 DIAGNOSIS — F411 Generalized anxiety disorder: Secondary | ICD-10-CM | POA: Diagnosis not present

## 2019-11-07 DIAGNOSIS — M6281 Muscle weakness (generalized): Secondary | ICD-10-CM | POA: Diagnosis not present

## 2019-12-03 ENCOUNTER — Other Ambulatory Visit: Payer: Self-pay | Admitting: *Deleted

## 2019-12-03 MED ORDER — PREGABALIN 50 MG PO CAPS: 50.0000 mg | ORAL_CAPSULE | Freq: Every day | ORAL | 3 refills | Status: DC

## 2019-12-03 NOTE — Telephone Encounter (Signed)
Received fax from [facility] and sent to [facility] for approval.

## 2019-12-24 ENCOUNTER — Non-Acute Institutional Stay: Payer: Medicare Other | Admitting: Nurse Practitioner

## 2019-12-24 ENCOUNTER — Encounter: Payer: Self-pay | Admitting: Nurse Practitioner

## 2019-12-24 DIAGNOSIS — K219 Gastro-esophageal reflux disease without esophagitis: Secondary | ICD-10-CM

## 2019-12-24 DIAGNOSIS — M8949 Other hypertrophic osteoarthropathy, multiple sites: Secondary | ICD-10-CM | POA: Diagnosis not present

## 2019-12-24 DIAGNOSIS — N390 Urinary tract infection, site not specified: Secondary | ICD-10-CM

## 2019-12-24 DIAGNOSIS — N952 Postmenopausal atrophic vaginitis: Secondary | ICD-10-CM

## 2019-12-24 DIAGNOSIS — J449 Chronic obstructive pulmonary disease, unspecified: Secondary | ICD-10-CM

## 2019-12-24 DIAGNOSIS — D649 Anemia, unspecified: Secondary | ICD-10-CM

## 2019-12-24 DIAGNOSIS — I10 Essential (primary) hypertension: Secondary | ICD-10-CM | POA: Diagnosis not present

## 2019-12-24 DIAGNOSIS — M159 Polyosteoarthritis, unspecified: Secondary | ICD-10-CM

## 2019-12-24 NOTE — Assessment & Plan Note (Signed)
HTN, blood pressure is controlled on Metoprolol 12.5mg  bid.

## 2019-12-24 NOTE — Assessment & Plan Note (Signed)
Will resume Symbicort, may consider CXR, CBC/diff, CMP/eGFR.

## 2019-12-24 NOTE — Assessment & Plan Note (Signed)
Anemia, baseline Hgb 10s, takes Vit B12

## 2019-12-24 NOTE — Assessment & Plan Note (Signed)
Urinary symptoms, stable on Nitrofurantoin 50mg  qd for UTI suppression

## 2019-12-24 NOTE — Assessment & Plan Note (Signed)
Resume  Omeprazole 20mg  qd.

## 2019-12-24 NOTE — Progress Notes (Signed)
Location:    [facility] Room Number: 016 Place of Service:  ALF (13) Provider: Marlana Latus NP  Raynelle Fujikawa X, NP  Patient Care Team: Janique Hoefer X, NP as PCP - General (Internal Medicine) Rosalia Mcavoy X, NP as Nurse Practitioner (Internal Medicine)  Extended Emergency Contact Information Primary Emergency Contact: Culbreth,Robin Address: [address] [place], [place] [address] [place] Phone: [phone] Mobile Phone: [phone] Relation: Daughter  Code Status: DNR Goals of care: Advanced Directive information Advanced Directives 10/15/2019  Does Patient Have a Medical Advance Directive? Yes  Type of Advance Directive Living will  Does patient want to make changes to medical advance directive? No - Patient declined  Copy of [place] in Chart? -  Would patient like information on creating a medical advance directive? -  Pre-existing out of facility DNR order (yellow form or pink MOST form) -     Chief Complaint  Patient presents with   Acute Visit    Clearing throat frequently    HPI:  Pt is a 86 y.o. female seen today for an acute visit for frequent clearing throat, cough, denied chest pain, SOB, wheezing, sputum production, palpitation.   Chronic lower back, leg pain, on Flexeril 2.33m qhs, Lyrica 576mqd, prn Aleve  HTN, blood pressure is controlled on Metoprolol 12.76m61mid.  Urinary symptoms, stable on Nitrofurantoin 11m36m for UTI suppression, Dr. OtteKarsten Rostrace vaginal cream for atrophic vaginitis.             Anemia, baseline Hgb 10.9 10/02/19, takes Vit B12  GERD, off  Omeprazole 20mg77m    Past Medical History:  Diagnosis Date   Acute blood loss anemia 08/23/2013   04/13/16 TSH 1.20, Na 141, K 4.2, Bun 15, creat 0.79, wbc 5.5, Hgb 11.5, plt 283   Anxiety    Anxiety state 07/02/2007   Qualifier: Diagnosis of  By: NadelLenna GilfordScottDeborra Medinaarotid artery-cavernous sinus  fistula 03/23/2016   Cigarette smoker    Colon polyps 2009   TWO TUBULAR ADENOMAS AND HYPERPLASTIC POLYPS   Constipation 09/14/2013   COPD (chronic obstructive pulmonary disease) (HCC)    Diverticulosis of colon    DJD (degenerative joint disease)    Dog bite of limb 07/14/2010   Dog bite 07/10/10 - dogs shots utd Last td was 08/2009  Localized tx only.     Fibromyalgia    GERD 12/19/2006   Qualifier: Diagnosis of  By: AdkinJulien Girt LeighMarliss Czar7/18 TSH 1.20, Na 141, K 4.2, Bun 15, creat 0.79, wbc 5.5, Hgb 11.5, plt 283    GERD (gastroesophageal reflux disease)    Hammer toe of second toe of left foot 04/08/2016   Left 2nd   Hearing loss    Hemorrhoids    Hypercholesterolemia    Osteoarthritis 12/19/2006   Qualifier: Diagnosis of  By: AdkinJulien Girt Leigh     Osteoporosis    Past Surgical History:  Procedure Laterality Date   CATARACT EXTRACTION, BILATERAL  2009   COLONOSCOPY  2009, 2006 , 2017   stapes surgery     Dr KrausThornell MuleNSILLECTOMY AND ADENOIDECTOMY     as a child    Allergies  Allergen Reactions   Penicillins Anaphylaxis   Bisphosphonates Other (See Comments)    [name]   Influenza Vaccines     Pt reported   Simvastatin  Unknown    Trazodone And Nefazodone     Felt her throat was closing up/kept her awake   Sulfonamide Derivatives Rash and Other (See Comments)    blisters    Allergies as of 12/24/2019      Reactions   Penicillins Anaphylaxis   Bisphosphonates Other (See Comments)   [name]   Influenza Vaccines    Pt reported   Simvastatin    Unknown    Trazodone And Nefazodone    Felt her throat was closing up/kept her awake   Sulfonamide Derivatives Rash, Other (See Comments)   blisters      Medication List       Accurate as of December 24, 2019 11:59 PM. If you have any questions, ask your nurse or doctor.        budesonide-formoterol 80-4.5 MCG/ACT inhaler Commonly known as: SYMBICORT Inhale 2 puffs into  the lungs 2 (two) times daily.   Calcium 600+D Plus Minerals 600-400 MG-UNIT Tabs Take 1 tablet by mouth daily.   calcium carbonate 750 MG chewable tablet Commonly known as: TUMS EX Chew 750 mg by mouth daily.   cholecalciferol 1000 units tablet Commonly known as: VITAMIN D Take 1,000 Units by mouth daily.   cyclobenzaprine 5 MG tablet Commonly known as: FLEXERIL Take 2.5 mg by mouth at bedtime.   estradiol 0.1 MG/GM vaginal cream Commonly known as: ESTRACE Place 1 Applicatorful vaginally daily. On Mon,Wed,Fri   hydrocortisone cream 1 % Apply 1 application topically 2 (two) times daily as needed. Apply to wrists   metoprolol tartrate 25 MG tablet Commonly known as: LOPRESSOR Take 12.5 mg by mouth 2 (two) times daily.   naproxen sodium 220 MG tablet Commonly known as: ALEVE Take 220 mg by mouth daily as needed. Take 1 tablet at dinner   nitrofurantoin 50 MG capsule Commonly known as: MACRODANTIN Take 50 mg by mouth at bedtime.   omeprazole 20 MG capsule Commonly known as: PRILOSEC Take 20 mg by mouth daily.   polyethylene glycol 17 g packet Commonly known as: MIRALAX / GLYCOLAX Take 17 g by mouth daily as needed. Once a day on Sun,Wed   polyethylene glycol 17 g packet Commonly known as: MIRALAX / GLYCOLAX Take 17 g by mouth daily as needed. This is in addition to the twice a week dosing.   pregabalin 50 MG capsule Commonly known as: LYRICA Take 1 capsule (50 mg total) by mouth daily.   vitamin B-12 1000 MCG tablet Commonly known as: CYANOCOBALAMIN Take 1,000 mcg by mouth daily.       Review of Systems  Constitutional: Negative for activity change, appetite change and fever.  HENT: Positive for hearing loss. Negative for congestion, rhinorrhea, sore throat and voice change.   Eyes: Negative for visual disturbance.  Respiratory: Positive for cough. Negative for shortness of breath and wheezing.   Cardiovascular: Negative for leg swelling.   Gastrointestinal: Negative for abdominal pain, constipation, nausea and vomiting.       Frequent clearing throat  Genitourinary: Negative for dysuria and urgency.  Musculoskeletal: Positive for arthralgias, back pain, gait problem and myalgias.  Skin: Positive for wound. Negative for color change and pallor.  Neurological: Negative for speech difficulty, weakness, light-headedness and headaches.  Psychiatric/Behavioral: Positive for sleep disturbance. Negative for behavioral problems and hallucinations. The patient is not nervous/anxious.        Chronic going to bed late, sleeping in late    Immunization History  Administered Date(s) Administered   Moderna SARS-COVID-2  Vaccination 02/17/2019, 03/17/2019   Pneumococcal Conjugate-13 09/01/2015   Pneumococcal Polysaccharide-23 07/07/1998   Td 08/21/2009   Pertinent  Health Maintenance Due  Topic Date Due   PNA vac Low Risk Adult (2 of 2 - PPSV23) 08/31/2016   DEXA SCAN  Completed   INFLUENZA VACCINE  Discontinued   Fall Risk  11/08/2017 11/05/2016 09/01/2015 08/31/2012  Falls in the past year? No Yes Yes No  Number falls in past yr: - 2 or more 1 -  Injury with Fall? - Yes No -  Risk for fall due to : - - Impaired balance/gait -   Functional Status Survey:    Vitals:   12/24/19 1600  BP: 128/64  Pulse: 66  Resp: 18  Temp: 99.1 F (37.3 C)  SpO2: 94%  Weight: 134 lb (60.8 kg)  Height: _0  (1.549 m)   Body mass index is 25.32 kg/m. Physical Exam Vitals and nursing note reviewed.  Constitutional:      Appearance: Normal appearance.  HENT:     Head: Normocephalic and atraumatic.     Nose: No congestion or rhinorrhea.     Mouth/Throat:     Mouth: Mucous membranes are moist.  Eyes:     Extraocular Movements: Extraocular movements intact.     Conjunctiva/sclera: Conjunctivae normal.     Pupils: Pupils are equal, round, and reactive to light.  Cardiovascular:     Rate and Rhythm: Normal rate and regular rhythm.      Heart sounds: No murmur heard.   Pulmonary:     Effort: Pulmonary effort is normal.     Breath sounds: No wheezing, rhonchi or rales.     Comments: Decreased air entry to both lungs.  Abdominal:     General: Bowel sounds are normal.     Palpations: Abdomen is soft.     Tenderness: There is no abdominal tenderness. There is no right CVA tenderness, left CVA tenderness, guarding or rebound.  Musculoskeletal:     Cervical back: Normal range of motion and neck supple.     Right lower leg: No edema.     Left lower leg: No edema.  Skin:    General: Skin is warm and dry.  Neurological:     General: No focal deficit present.     Mental Status: She is alert. Mental status is at baseline.     Motor: No weakness.     Coordination: Coordination normal.     Gait: Gait abnormal.     Comments: Oriented to person, place.   Psychiatric:        Mood and Affect: Mood normal.        Behavior: Behavior normal.     Labs reviewed: Recent Labs    01/10/19 0000 04/12/19 0000 10/02/19 0000  NA 141 141 141  K 4.1 4.0 4.1  CL 107 107 106  CO2 27* 27* 30*  BUN _1 CREATININE 0.8 0.8 0.7  CALCIUM 8.8 8.9 9.2   Recent Labs    01/10/19 0000 04/12/19 0000 10/02/19 0000  AST _2 ALT _3 ALKPHOS 82 70 71  ALBUMIN 3.6 3.5 3.7   Recent Labs    01/10/19 0000 04/12/19 0000 10/02/19 0000  WBC 5.1 5.2 4.6  NEUTROABS 2,678 2,647 2,157  HGB 10.6* 10.9* 10.9*  HCT 32* 33* 32*  PLT 195 213 182   Lab Results  Component Value Date   TSH 1.92 01/10/2019   Lab Results  Component Value Date   HGBA1C 5.8 02/17/2017   Lab Results  Component Value Date   CHOL 177 01/10/2019   HDL 52 01/10/2019   LDLCALC 104 01/10/2019   LDLDIRECT 114.7 08/18/2011   TRIG 110 01/10/2019   CHOLHDL 2 01/23/2015    Significant Diagnostic Results in last 30 days:  No results found.  Assessment/Plan: Osteoarthritis Chronic lower back, leg pain, on Flexeril 2.43m qhs, Lyrica 531mqd,  prn Aleve  Hypertension HTN, blood pressure is controlled on Metoprolol 12.73m80mid.  UTI (urinary tract infection) Urinary symptoms, stable on Nitrofurantoin 78m30m for UTI suppression   Atrophic vaginitis Estrace vaginal cream for atrophic vaginitis.   Chronic anemia Anemia, baseline Hgb 10s, takes Vit B12   COPD (chronic obstructive pulmonary disease) with chronic bronchitis (HCC)Finleyll resume Symbicort, may consider CXR, CBC/diff, CMP/eGFR.   GERD Resume  Omeprazole 20mg89m     Family/ staff Communication: plan of care reviewed with the patient and charge nurse.   Labs/tests ordered: CXR ap/lateral, CBC/diff, CMP/eGFR  Time spend 40 minutes.

## 2019-12-24 NOTE — Assessment & Plan Note (Addendum)
Chronic lower back, leg pain, on Flexeril 2.5mg  qhs, Lyrica 50mg  qd, prn Aleve

## 2019-12-24 NOTE — Assessment & Plan Note (Signed)
Estrace vaginal cream for atrophic vaginitis.  

## 2019-12-25 ENCOUNTER — Encounter: Payer: Self-pay | Admitting: Nurse Practitioner

## 2019-12-25 DIAGNOSIS — Z23 Encounter for immunization: Secondary | ICD-10-CM | POA: Diagnosis not present

## 2019-12-25 DIAGNOSIS — I10 Essential (primary) hypertension: Secondary | ICD-10-CM | POA: Diagnosis not present

## 2019-12-25 DIAGNOSIS — R059 Cough, unspecified: Secondary | ICD-10-CM | POA: Diagnosis not present

## 2019-12-25 LAB — BASIC METABOLIC PANEL
BUN: 11 (ref 4–21)
CO2: 31 — AB (ref 13–22)
Chloride: 106 (ref 99–108)
Creatinine: 0.7 (ref 0.5–1.1)
Glucose: 86
Potassium: 4.1 (ref 3.4–5.3)
Sodium: 142 (ref 137–147)

## 2019-12-25 LAB — HEPATIC FUNCTION PANEL
ALT: 7 (ref 7–35)
AST: 13 (ref 13–35)
Alkaline Phosphatase: 81 (ref 25–125)
Bilirubin, Total: 0.4

## 2019-12-25 LAB — COMPREHENSIVE METABOLIC PANEL
Albumin: 3.6 (ref 3.5–5.0)
Calcium: 9.1 (ref 8.7–10.7)
Globulin: 1.8

## 2020-01-15 ENCOUNTER — Non-Acute Institutional Stay: Payer: Medicare Other | Admitting: Internal Medicine

## 2020-01-15 ENCOUNTER — Encounter: Payer: Self-pay | Admitting: Internal Medicine

## 2020-01-15 DIAGNOSIS — M810 Age-related osteoporosis without current pathological fracture: Secondary | ICD-10-CM

## 2020-01-15 DIAGNOSIS — N952 Postmenopausal atrophic vaginitis: Secondary | ICD-10-CM

## 2020-01-15 DIAGNOSIS — J449 Chronic obstructive pulmonary disease, unspecified: Secondary | ICD-10-CM

## 2020-01-15 DIAGNOSIS — N390 Urinary tract infection, site not specified: Secondary | ICD-10-CM

## 2020-01-15 DIAGNOSIS — R442 Other hallucinations: Secondary | ICD-10-CM

## 2020-01-15 DIAGNOSIS — I10 Essential (primary) hypertension: Secondary | ICD-10-CM | POA: Diagnosis not present

## 2020-01-15 DIAGNOSIS — M797 Fibromyalgia: Secondary | ICD-10-CM | POA: Diagnosis not present

## 2020-01-15 NOTE — Progress Notes (Signed)
Location:  [facility] Room Number: 765 Place of Service:  ALF [phone])  Provider: Veleta Miners MD  Code Status: Full Code Goals of Care:  Advanced Directives 10/15/2019  Does Patient Have a Medical Advance Directive? Yes  Type of Advance Directive Living will  Does patient want to make changes to medical advance directive? No - Patient declined  Copy of [place] in Chart? -  Would patient like information on creating a medical advance directive? -  Pre-existing out of facility DNR order (yellow form or pink MOST form) -     Chief Complaint  Patient presents with   Medical Management of Chronic Issues    HPI: Patient is a 86 y.o. female seen today for medical management of chronic diseases.    Patient is long term resident of AL unit in [place]. Patient has h/ofibromyalgia on Lyrica hypertension, hyperlipidemia anemia, osteoporosis, COPD , Insomniaand Tactile hallucinations but patient refuses to take any Meds for it  Doing well in AL. Symptoms of Cough are better on Symbicort Weight stable No falls. 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     Fibromyalgia    GERD 12/19/2006   Qualifier: Diagnosis of  By: Julien Girt CMA, Marliss Czar  04/13/16 TSH 1.20, Na 141, K 4.2, Bun 15, creat 0.79, wbc 5.5, Hgb 11.5, plt 283      GERD (gastroesophageal reflux disease)    Hammer toe of second toe of left foot 04/08/2016   Left 2nd   Hearing loss    Hemorrhoids    Hypercholesterolemia    Osteoarthritis 12/19/2006   Qualifier: Diagnosis of  By: Julien Girt CMA, Leigh     Osteoporosis     Past Surgical History:  Procedure Laterality Date   CATARACT EXTRACTION, BILATERAL  2009   COLONOSCOPY  2009, 2006 , 2017   stapes surgery     Dr Thornell Mule   TONSILLECTOMY AND ADENOIDECTOMY     as a child    Allergies  Allergen Reactions   Penicillins Anaphylaxis   Bisphosphonates Other (See Comments)    [name]   Influenza Vaccines     Pt reported   Simvastatin     Unknown    Trazodone And Nefazodone     Felt her throat was closing up/kept her awake   Sulfonamide Derivatives Rash and Other (See Comments)    blisters    Outpatient Encounter Medications as of 01/15/2020  Medication Sig   budesonide-formoterol (SYMBICORT) 80-4.5 MCG/ACT inhaler Inhale 2 puffs into the lungs 2 (two) times daily.   calcium carbonate (TUMS EX) 750 MG chewable tablet Chew 750 mg by mouth daily.    Calcium Carbonate-Vit D-Min (CALCIUM 600+D PLUS MINERALS) 600-400 MG-UNIT TABS Take 1 tablet by mouth daily.  cholecalciferol (VITAMIN D) 1000 UNITS tablet Take 1,000 Units by mouth daily.   cyclobenzaprine (FLEXERIL) 5 MG tablet Take 2.5 mg by mouth at bedtime.   estradiol (ESTRACE) 0.1 MG/GM vaginal cream Place 1 Applicatorful vaginally daily. On Mon,Wed,Fri   hydrocortisone cream 1 % Apply 1 application topically 2 (two) times daily as needed. Apply to wrists   metoprolol tartrate (LOPRESSOR) 25 MG tablet Take 12.5 mg by mouth 2 (two) times daily.   naproxen sodium (ALEVE) 220 MG tablet Take 220 mg by mouth daily as needed. Take 1 tablet at dinner   nitrofurantoin (MACRODANTIN) 50 MG capsule Take 50 mg by mouth at bedtime.   polyethylene glycol (MIRALAX / GLYCOLAX) 17 g packet Take 17 g by mouth daily as  needed. This is in addition to the twice a week dosing.   polyethylene glycol (MIRALAX / GLYCOLAX) packet Take 17 g by mouth daily as needed. Once a day on Sun,Wed   pregabalin (LYRICA) 50 MG capsule Take 1 capsule (50 mg total) by mouth daily.   vitamin B-12 (CYANOCOBALAMIN) 1000 MCG tablet Take 1,000 mcg by mouth daily.   [DISCONTINUED] omeprazole (PRILOSEC) 20 MG capsule Take 20 mg by mouth daily.   No facility-administered encounter medications on file as of 01/15/2020.    Review of Systems:  Review of Systems  Constitutional: Positive for activity change and appetite change.  HENT: Negative.   Respiratory: Positive for cough.   Cardiovascular: Negative.   Gastrointestinal: Negative.   Genitourinary: Negative.   Musculoskeletal: Positive for arthralgias and myalgias.  Skin: Negative.   Neurological: Positive for weakness.  Psychiatric/Behavioral: Positive for dysphoric mood and sleep disturbance.       Health Maintenance  Topic Date Due   PNA vac Low Risk Adult (2 of 2 - PPSV23) 08/31/2016   TETANUS/TDAP  08/22/2019   DEXA SCAN  Completed   COVID-19 Vaccine  Completed   INFLUENZA VACCINE  Discontinued    Physical Exam: Vitals:   01/15/20 1446  BP: 124/80  Pulse: 76  Resp: 20  Temp: 98.8 F (37.1 C)  SpO2: 96%  Weight: 134 lb (60.8 kg)  Height: 5\' 1"  (1.549 m)   Body mass index is 25.32 kg/m. Physical Exam  Constitutional: Oriented to person, place, and time. Well-developed and well-nourished.  HENT:  Head: Normocephalic.  Mouth/Throat: Oropharynx is clear and moist.  Eyes: Pupils are equal, round, and reactive to light.  Neck: Neck supple.  Cardiovascular: Normal rate and normal heart sounds.  No murmur heard. Pulmonary/Chest: Effort normal and breath sounds normal. No respiratory distress. No wheezes. She has no rales.  Abdominal: Soft. Bowel sounds are normal. No distension. There is no tenderness. There is no rebound.  Musculoskeletal: mild  edema bilateral Lymphadenopathy: none Neurological: Alert and oriented to person, place, and time.  Skin: Skin is warm and dry.  Psychiatric: Normal mood and affect. Behavior is normal. Thought content normal.    Labs reviewed: Basic Metabolic Panel: Recent Labs    04/12/19 0000 10/02/19 0000 12/25/19 0000  NA 141 141 142  K 4.0 4.1 4.1  CL 107 106 106  CO2 27* 30* 31*  BUN 15 15 11   CREATININE 0.8 0.7 0.7  CALCIUM 8.9 9.2 9.1   Liver Function Tests: Recent Labs    04/12/19 0000 10/02/19 0000 12/25/19 0000  AST 13 15 13   ALT 7 10 7   ALKPHOS 70 71 81  ALBUMIN 3.5 3.7 3.6   No results for input(s): LIPASE, AMYLASE in the last  8760 hours. No results for input(s): AMMONIA in the last 8760 hours. CBC: Recent Labs    04/12/19 0000 10/02/19 0000  WBC 5.2 4.6  NEUTROABS 2,647 2,157  HGB 10.9* 10.9*  HCT 33* 32*  PLT 213 182   Lipid Panel: No results for input(s): CHOL, HDL, LDLCALC, TRIG, CHOLHDL, LDLDIRECT in the last 8760 hours. Lab Results  Component Value Date   HGBA1C 5.8 02/17/2017    Procedures since last visit: No results found.  Assessment/Plan COPD (chronic obstructive pulmonary disease) with chronic bronchitis (HCC) Doing well on Symbicort Primary hypertension Stable on Lopressor Recurent Urinary tract infection  Nitofurantoin per Dr Noralee Chars Atrophic vaginitis Estrace Fibromyalgia Continue Lyrica  Tactile hallucinations Doing better Age-related osteoporosis without current pathological fracture Has refused treatment T scrore -2.8 in 2018 Chronic pain of left knee Does not want Xray Taking Alleve everyday Discussed with her Sideffects Anemia Will need repeat CBC On B12   Labs/tests ordered:  CBC Next appt:  Visit date not found

## 2020-01-23 DIAGNOSIS — I10 Essential (primary) hypertension: Secondary | ICD-10-CM | POA: Diagnosis not present

## 2020-01-23 DIAGNOSIS — D649 Anemia, unspecified: Secondary | ICD-10-CM | POA: Diagnosis not present

## 2020-01-23 LAB — CBC AND DIFFERENTIAL
HCT: 31 — AB (ref 36–46)
Hemoglobin: 10.3 — AB (ref 12.0–16.0)
Platelets: 177 (ref 150–399)
WBC: 4.7

## 2020-01-23 LAB — CBC: RBC: 3.43 — AB (ref 3.87–5.11)

## 2020-01-29 ENCOUNTER — Other Ambulatory Visit: Payer: Self-pay | Admitting: *Deleted

## 2020-01-29 MED ORDER — PREGABALIN 50 MG PO CAPS: 50.0000 mg | ORAL_CAPSULE | Freq: Every day | ORAL | 3 refills | Status: DC

## 2020-01-29 NOTE — Telephone Encounter (Signed)
Received refill Request from [facility] Pended Rx and sent to [facility] for approval.  

## 2020-02-16 ENCOUNTER — Encounter (HOSPITAL_COMMUNITY): Payer: Self-pay | Admitting: Emergency Medicine

## 2020-02-16 ENCOUNTER — Inpatient Hospital Stay (HOSPITAL_COMMUNITY)
Admission: EM | Admit: 2020-02-16 | Discharge: 2020-02-21 | DRG: 378 | Disposition: A | Discharge status: Skilled Nursing Facility | Payer: Medicare Other | Attending: Family Medicine | Admitting: Family Medicine

## 2020-02-16 ENCOUNTER — Other Ambulatory Visit: Payer: Self-pay

## 2020-02-16 DIAGNOSIS — Z20822 Contact with and (suspected) exposure to covid-19: Secondary | ICD-10-CM | POA: Diagnosis present

## 2020-02-16 DIAGNOSIS — Z79899 Other long term (current) drug therapy: Secondary | ICD-10-CM

## 2020-02-16 DIAGNOSIS — F29 Unspecified psychosis not due to a substance or known physiological condition: Secondary | ICD-10-CM | POA: Diagnosis not present

## 2020-02-16 DIAGNOSIS — E876 Hypokalemia: Secondary | ICD-10-CM | POA: Diagnosis not present

## 2020-02-16 DIAGNOSIS — N952 Postmenopausal atrophic vaginitis: Secondary | ICD-10-CM | POA: Diagnosis not present

## 2020-02-16 DIAGNOSIS — K5901 Slow transit constipation: Secondary | ICD-10-CM | POA: Diagnosis not present

## 2020-02-16 DIAGNOSIS — Z8261 Family history of arthritis: Secondary | ICD-10-CM

## 2020-02-16 DIAGNOSIS — H9190 Unspecified hearing loss, unspecified ear: Secondary | ICD-10-CM | POA: Diagnosis present

## 2020-02-16 DIAGNOSIS — I10 Essential (primary) hypertension: Secondary | ICD-10-CM | POA: Diagnosis present

## 2020-02-16 DIAGNOSIS — Z8249 Family history of ischemic heart disease and other diseases of the circulatory system: Secondary | ICD-10-CM

## 2020-02-16 DIAGNOSIS — T39395A Adverse effect of other nonsteroidal anti-inflammatory drugs [NSAID], initial encounter: Secondary | ICD-10-CM | POA: Diagnosis present

## 2020-02-16 DIAGNOSIS — Z66 Do not resuscitate: Secondary | ICD-10-CM | POA: Diagnosis not present

## 2020-02-16 DIAGNOSIS — M810 Age-related osteoporosis without current pathological fracture: Secondary | ICD-10-CM | POA: Diagnosis present

## 2020-02-16 DIAGNOSIS — K259 Gastric ulcer, unspecified as acute or chronic, without hemorrhage or perforation: Secondary | ICD-10-CM | POA: Diagnosis present

## 2020-02-16 DIAGNOSIS — I77819 Aortic ectasia, unspecified site: Secondary | ICD-10-CM | POA: Diagnosis present

## 2020-02-16 DIAGNOSIS — K219 Gastro-esophageal reflux disease without esophagitis: Secondary | ICD-10-CM | POA: Diagnosis present

## 2020-02-16 DIAGNOSIS — R442 Other hallucinations: Secondary | ICD-10-CM | POA: Diagnosis not present

## 2020-02-16 DIAGNOSIS — B49 Unspecified mycosis: Secondary | ICD-10-CM | POA: Diagnosis not present

## 2020-02-16 DIAGNOSIS — K921 Melena: Secondary | ICD-10-CM

## 2020-02-16 DIAGNOSIS — K254 Chronic or unspecified gastric ulcer with hemorrhage: Secondary | ICD-10-CM | POA: Diagnosis not present

## 2020-02-16 DIAGNOSIS — R001 Bradycardia, unspecified: Secondary | ICD-10-CM | POA: Diagnosis not present

## 2020-02-16 DIAGNOSIS — Z7951 Long term (current) use of inhaled steroids: Secondary | ICD-10-CM

## 2020-02-16 DIAGNOSIS — Z882 Allergy status to sulfonamides status: Secondary | ICD-10-CM

## 2020-02-16 DIAGNOSIS — K922 Gastrointestinal hemorrhage, unspecified: Secondary | ICD-10-CM | POA: Diagnosis not present

## 2020-02-16 DIAGNOSIS — Z87891 Personal history of nicotine dependence: Secondary | ICD-10-CM

## 2020-02-16 DIAGNOSIS — Z888 Allergy status to other drugs, medicaments and biological substances status: Secondary | ICD-10-CM

## 2020-02-16 DIAGNOSIS — Z880 Allergy status to penicillin: Secondary | ICD-10-CM

## 2020-02-16 DIAGNOSIS — R58 Hemorrhage, not elsewhere classified: Secondary | ICD-10-CM | POA: Diagnosis not present

## 2020-02-16 DIAGNOSIS — R9431 Abnormal electrocardiogram [ECG] [EKG]: Secondary | ICD-10-CM | POA: Diagnosis not present

## 2020-02-16 DIAGNOSIS — K2940 Chronic atrophic gastritis without bleeding: Secondary | ICD-10-CM | POA: Diagnosis present

## 2020-02-16 DIAGNOSIS — I959 Hypotension, unspecified: Secondary | ICD-10-CM | POA: Diagnosis not present

## 2020-02-16 DIAGNOSIS — N390 Urinary tract infection, site not specified: Secondary | ICD-10-CM | POA: Diagnosis not present

## 2020-02-16 DIAGNOSIS — D649 Anemia, unspecified: Secondary | ICD-10-CM | POA: Diagnosis not present

## 2020-02-16 DIAGNOSIS — M797 Fibromyalgia: Secondary | ICD-10-CM | POA: Diagnosis present

## 2020-02-16 DIAGNOSIS — K649 Unspecified hemorrhoids: Secondary | ICD-10-CM | POA: Diagnosis present

## 2020-02-16 DIAGNOSIS — E785 Hyperlipidemia, unspecified: Secondary | ICD-10-CM | POA: Diagnosis present

## 2020-02-16 DIAGNOSIS — M8949 Other hypertrophic osteoarthropathy, multiple sites: Secondary | ICD-10-CM | POA: Diagnosis not present

## 2020-02-16 DIAGNOSIS — K2950 Unspecified chronic gastritis without bleeding: Secondary | ICD-10-CM | POA: Diagnosis not present

## 2020-02-16 DIAGNOSIS — K5731 Diverticulosis of large intestine without perforation or abscess with bleeding: Principal | ICD-10-CM | POA: Diagnosis present

## 2020-02-16 DIAGNOSIS — J449 Chronic obstructive pulmonary disease, unspecified: Secondary | ICD-10-CM | POA: Diagnosis present

## 2020-02-16 DIAGNOSIS — E7800 Pure hypercholesterolemia, unspecified: Secondary | ICD-10-CM | POA: Diagnosis present

## 2020-02-16 DIAGNOSIS — M1990 Unspecified osteoarthritis, unspecified site: Secondary | ICD-10-CM | POA: Diagnosis present

## 2020-02-16 DIAGNOSIS — D62 Acute posthemorrhagic anemia: Secondary | ICD-10-CM | POA: Diagnosis present

## 2020-02-16 DIAGNOSIS — Z825 Family history of asthma and other chronic lower respiratory diseases: Secondary | ICD-10-CM

## 2020-02-16 DIAGNOSIS — Z887 Allergy status to serum and vaccine status: Secondary | ICD-10-CM

## 2020-02-16 DIAGNOSIS — R011 Cardiac murmur, unspecified: Secondary | ICD-10-CM | POA: Diagnosis present

## 2020-02-16 DIAGNOSIS — K229 Disease of esophagus, unspecified: Secondary | ICD-10-CM | POA: Diagnosis not present

## 2020-02-16 DIAGNOSIS — K31A19 Gastric intestinal metaplasia without dysplasia, unspecified site: Secondary | ICD-10-CM | POA: Diagnosis not present

## 2020-02-16 HISTORY — DX: Essential (primary) hypertension: I10

## 2020-02-16 HISTORY — DX: Unspecified asthma, uncomplicated: J45.909

## 2020-02-16 LAB — COMPREHENSIVE METABOLIC PANEL
ALT: 15 U/L (ref 0–44)
AST: 18 U/L (ref 15–41)
Albumin: 3.8 g/dL (ref 3.5–5.0)
Alkaline Phosphatase: 72 U/L (ref 38–126)
Anion gap: 8 (ref 5–15)
BUN: 26 mg/dL — ABNORMAL HIGH (ref 8–23)
CO2: 24 mmol/L (ref 22–32)
Calcium: 8.9 mg/dL (ref 8.9–10.3)
Chloride: 106 mmol/L (ref 98–111)
Creatinine, Ser: 0.96 mg/dL (ref 0.44–1.00)
GFR, Estimated: 58 mL/min — ABNORMAL LOW (ref >60)
Glucose, Bld: 140 mg/dL — ABNORMAL HIGH (ref 70–99)
Potassium: 4.2 mmol/L (ref 3.5–5.1)
Sodium: 138 mmol/L (ref 135–145)
Total Bilirubin: 0.6 mg/dL (ref 0.3–1.2)
Total Protein: 6 g/dL — ABNORMAL LOW (ref 6.5–8.1)

## 2020-02-16 LAB — HEMOGLOBIN AND HEMATOCRIT, BLOOD
HCT: 27.7 % — ABNORMAL LOW (ref 36.0–46.0)
HCT: 28.8 % — ABNORMAL LOW (ref 36.0–46.0)
HCT: 25.1 % — ABNORMAL LOW (ref 36.0–46.0)
Hemoglobin: 8.9 g/dL — ABNORMAL LOW (ref 12.0–15.0)
Hemoglobin: 9.3 g/dL — ABNORMAL LOW (ref 12.0–15.0)
Hemoglobin: 8.1 g/dL — ABNORMAL LOW (ref 12.0–15.0)

## 2020-02-16 LAB — CBC
HCT: 30.1 % — ABNORMAL LOW (ref 36.0–46.0)
Hemoglobin: 9.8 g/dL — ABNORMAL LOW (ref 12.0–15.0)
MCH: 30.8 pg (ref 26.0–34.0)
MCHC: 32.6 g/dL (ref 30.0–36.0)
MCV: 94.7 fL (ref 80.0–100.0)
Platelets: 218 10*3/uL (ref 150–400)
RBC: 3.18 MIL/uL — ABNORMAL LOW (ref 3.87–5.11)
RDW: 14.1 % (ref 11.5–15.5)
WBC: 8.1 10*3/uL (ref 4.0–10.5)
nRBC: 0 % (ref 0.0–0.2)

## 2020-02-16 LAB — RESP PANEL BY RT-PCR (FLU A&B, COVID) ARPGX2
Influenza A by PCR: NEGATIVE
Influenza B by PCR: NEGATIVE
SARS Coronavirus 2 by RT PCR: NEGATIVE

## 2020-02-16 LAB — ABO/RH: ABO/RH(D): A POS

## 2020-02-16 MED ORDER — ONDANSETRON HCL 4 MG/2ML IJ SOLN: 4.0000 mg | Freq: Four times a day (QID) | INTRAMUSCULAR | Status: DC | PRN

## 2020-02-16 MED ORDER — SODIUM CHLORIDE 0.9% FLUSH
3.0000 mL | Freq: Two times a day (BID) | INTRAVENOUS | Status: DC
  Administered 2020-02-16 – 2020-02-21 (×10): 3 mL via INTRAVENOUS

## 2020-02-16 MED ORDER — FLUTICASONE FUROATE-VILANTEROL 100-25 MCG/INH IN AEPB
1.0000 | INHALATION_SPRAY | Freq: Every day | RESPIRATORY_TRACT | Status: DC
  Administered 2020-02-17 – 2020-02-21 (×5): 1 via RESPIRATORY_TRACT
  Filled 2020-02-16: qty 28

## 2020-02-16 MED ORDER — SODIUM CHLORIDE 0.9 % IV SOLN: INTRAVENOUS | Status: DC

## 2020-02-16 MED ORDER — LACTATED RINGERS IV BOLUS: 1000.0000 mL | Freq: Once | INTRAVENOUS | Status: DC

## 2020-02-16 MED ORDER — ONDANSETRON HCL 4 MG/2ML IJ SOLN
4.0000 mg | Freq: Three times a day (TID) | INTRAMUSCULAR | Status: DC | PRN
  Administered 2020-02-17: 4 mg via INTRAVENOUS
  Filled 2020-02-16 (×2): qty 2

## 2020-02-16 MED ORDER — PANTOPRAZOLE SODIUM 40 MG IV SOLR
40.0000 mg | Freq: Two times a day (BID) | INTRAVENOUS | Status: DC
  Administered 2020-02-16 – 2020-02-17 (×3): 40 mg via INTRAVENOUS
  Filled 2020-02-16 (×3): qty 40

## 2020-02-16 NOTE — ED Notes (Signed)
Report given to Casey, RN

## 2020-02-16 NOTE — Consult Note (Signed)
 Referring Provider: [facility]  Primary Care Physician:  Mast, Man X, NP Primary Gastroenterologist: Dr. Gessner MD  Reason for Consultation: GI bleed   ASSESSMENT AND PLAN:    GI bleed - Upper vs lower. Hb 10.9 --> 8.1. BUN 26. H/O NSAIDs.  Certainly, could represent diverticular bleed.  Last colon 10/2013 with mod sigmoid div.Good prep  Plan: -IV Protonix -Trend CBC. Keep Hb>7 -EGD in AM. -She is not keen on getting colon done. -If brisk bleeding, CTA followed by IR embolization if + -Clear liquid diet.  N.p.o. after midnight.         HPI: Lisa King is a 88 y.o. female  Resident of NH With H/O COPD, HTN, HLD, chronic anemia, fibromyalgia Admitted with rectal bleeding-melanotic stools with some maroon-colored stools.  None since this morning. She denies having any abdominal pain.  No nausea, vomiting.  Has been taking 2 tablets of Aleve per day for arthritis.  Also has longstanding history of gastroesophageal reflux.  No fever chills or night sweats.  No weight loss.    Previous colonoscopy 10/2013; 1. Moderate diverticulosis was noted in the sigmoid colon 2. Severe melanosis was found throughout the entire examined colon 3. The colon was redundant 4. The colon mucosa was otherwise normal - adequate pep Past Medical History:  Diagnosis Date  . Acute blood loss anemia 08/23/2013   04/13/16 TSH 1.20, Na 141, K 4.2, Bun 15, creat 0.79, wbc 5.5, Hgb 11.5, plt 283  . Anxiety   . Anxiety state 07/02/2007   Qualifier: Diagnosis of  By: Nadel MD, Scott M   . 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 Cigarette smoker   . Colon polyps 2009   TWO TUBULAR ADENOMAS AND HYPERPLASTIC POLYPS  . Constipation 09/14/2013  . COPD (chronic obstructive pulmonary disease) (HCC)   . Diverticulosis of colon   . DJD (degenerative joint disease)   . Dog bite of limb 07/14/2010   Dog bite 07/10/10 - dogs shots utd Last td was 08/2009  Localized tx only.    . Fibromyalgia    . GERD 12/19/2006   Qualifier: Diagnosis of  By: Adkins CMA, Leigh  04/13/16 TSH 1.20, Na 141, K 4.2, Bun 15, creat 0.79, wbc 5.5, Hgb 11.5, plt 283   . GERD (gastroesophageal reflux disease)   . Hammer toe of second toe of left foot 04/08/2016   Left 2nd  . Hearing loss   . Hemorrhoids   . Hypercholesterolemia   . Osteoarthritis 12/19/2006   Qualifier: Diagnosis of  By: Adkins CMA, Leigh    . Osteoporosis     Past Surgical History:  Procedure Laterality Date  . CATARACT EXTRACTION, BILATERAL  2009  . COLONOSCOPY  2009, 2006 , 2017  . stapes surgery     Dr Kraus  . TONSILLECTOMY AND ADENOIDECTOMY     as a child    Prior to Admission medications   Medication Sig Start Date End Date Taking? Authorizing Provider  budesonide-formoterol (SYMBICORT) 80-4.5 MCG/ACT inhaler Inhale 1 puff into the lungs 2 (two) times daily.   Yes [provider]  calcium carbonate (TUMS EX) 750 MG chewable tablet Chew 750 mg by mouth daily.    Yes [provider]  Calcium Carbonate-Vit D-Min (CALCIUM 600+D PLUS MINERALS) 600-400 MG-UNIT TABS Take 1 tablet by mouth daily.    Yes [provider]  cholecalciferol (VITAMIN D) 1000 UNITS tablet Take 1,000 Units by mouth daily.   Yes [provider]  cyclobenzaprine (FLEXERIL)   5 MG tablet Take 2.5 mg by mouth at bedtime.   Yes [provider]  estradiol (ESTRACE) 0.1 MG/GM vaginal cream Place 1 Applicatorful vaginally every Monday, Wednesday, and Friday.   Yes [provider]  hydrocortisone cream 1 % Apply 1 application topically 2 (two) times daily as needed (wrists).   Yes [provider]  metoprolol tartrate (LOPRESSOR) 25 MG tablet Take 12.5 mg by mouth 2 (two) times daily.   Yes [provider]  naproxen sodium (ALEVE) 220 MG tablet Take 220 mg by mouth daily as needed (pain).   Yes [provider]  naproxen sodium (ALEVE) 220 MG tablet Take 220 mg by mouth at bedtime.   Yes  [provider]  nitrofurantoin (MACRODANTIN) 50 MG capsule Take 50 mg by mouth at bedtime. 10/21/18  Yes [provider]  polyethylene glycol (MIRALAX / GLYCOLAX) 17 g packet Take 17 g by mouth 2 (two) times a week. Sunday and Wednesday   Yes [provider]  polyethylene glycol (MIRALAX / GLYCOLAX) packet Take 17 g by mouth daily as needed for mild constipation.   Yes [provider]  pregabalin (LYRICA) 50 MG capsule Take 1 capsule (50 mg total) by mouth daily. 01/29/20  Yes Mast, Man X, NP  vitamin B-12 (CYANOCOBALAMIN) 1000 MCG tablet Take 1,000 mcg by mouth daily.   Yes [provider]    Current Facility-Administered Medications  Medication Dose Route Frequency Provider Last Rate Last Admin  . fluticasone furoate-vilanterol (BREO ELLIPTA) 100-25 MCG/INH 1 puff  1 puff Inhalation Daily Jae Dire, MD      . pantoprazole (PROTONIX) injection 40 mg  40 mg Intravenous Q12H Jae Dire, MD   40 mg at 02/16/20 1127  . sodium chloride flush (NS) 0.9 % injection 3 mL  3 mL Intravenous Q12H Jae Dire, MD   3 mL at 02/16/20 1128    Allergies as of 02/16/2020 - Review Complete 02/16/2020  Allergen Reaction Noted  . Penicillins Anaphylaxis 08/23/2013  . Bisphosphonates Other (See Comments)   . Influenza vaccines  03/04/2015  . Simvastatin    . Trazodone and nefazodone  09/01/2015  . Sulfonamide derivatives Rash and Other (See Comments)     Family History  Problem Relation Age of Onset  . Heart disease Father   . COPD Father   . Hypertension Mother   . Arthritis Mother     Social History   Socioeconomic History  . Marital status: Widowed    Spouse name: Not on file  . Number of children: Not on file  . Years of education: Not on file  . Highest education level: Not on file  Occupational History  . Occupation: retired  . Occupation: works some weekends at [employer]  . Smoking status: Former Smoker     Types: Cigarettes    Quit date: 02/15/2010    Years since quitting: 10.0  . Smokeless tobacco: Never Used  Substance and Sexual Activity  . Alcohol use: Yes    Comment: occasional glass of wine  . Drug use: No  . Sexual activity: Never  Other Topics Concern  . Not on file  Social History Narrative   Pt works some weekends at [employer]   Caffeine use - daily coffee in the mornings   Patient gets regular exercise > tries to walk daily for exercise   Moved to [facility] [place] 03/11/16   Widowed   Former Smoker-stopped 2012  Alcohol occasionally glass of wine   Living Will   Social Determinants of Health   Financial Resource Strain: Not on file  Food Insecurity: Not on file  Transportation Needs: Not on file  Physical Activity: Not on file  Stress: Not on file  Social Connections: Not on file  Intimate Partner Violence: Not on file     Review of Systems: Not obtained since patient wanted to sleep.   Physical Exam: Vital signs in last 24 hours: Temp:  [97.5 F (36.4 C)-98 F (36.7 C)] 97.5 F (36.4 C) (01/01 1641) Pulse Rate:  [51-87] 72 (01/01 1641) Resp:  [15-21] 21 (01/01 1641) BP: (112-183)/(45-69) 122/55 (01/01 1641) SpO2:  [93 %-100 %] 100 % (01/01 1641) Weight:  [60.3 kg] 60.3 kg (01/01 0829) Last BM Date: 02/16/20 Wt Readings from Last 3 Encounters:  02/16/20 60.3 kg  01/15/20 60.8 kg  12/24/19 60.8 kg   General:   Alert HEENT: Normal. No icterus. Abdomen:  Soft, non-distended, nontender, BS active, no palp mass    Rectal:  Deferred  . Neurologic:  Alert;  grossly normal neurologically. Skin:  Intact without significant lesions or rashes..   Intake/Output from previous day: No intake/output data recorded. Intake/Output this shift: Total I/O In: 610 [P.O.:600; I.V.:10] Out: -   Lab Results: Recent Labs    02/16/20 0408 02/16/20 0550 02/16/20 0900 02/16/20 1455  WBC 8.1  --   --   --   HGB 9.8* 8.9* 9.3* 8.1*  HCT 30.1* 27.7*  28.8* 25.1*  PLT 218  --   --   --    BMET Recent Labs    02/16/20 0408  NA 138  K 4.2  CL 106  CO2 24  GLUCOSE 140*  BUN 26*  CREATININE 0.96  CALCIUM 8.9   LFT Recent Labs    02/16/20 0408  PROT 6.0*  ALBUMIN 3.8  AST 18  ALT 15  ALKPHOS 72  BILITOT 0.6   PT/INR No results for input(s): LABPROT, INR in the last 72 hours. Hepatitis Panel No results for input(s): HEPBSAG, HCVAB, HEPAIGM, HEPBIGM in the last 72 hours.   . CBC Latest Ref Rng & Units 02/16/2020 02/16/2020 02/16/2020  WBC 4.0 - 10.5 K/uL - - -  Hemoglobin 12.0 - 15.0 g/dL 8.1(L) 9.3(L) 8.9(L)  Hematocrit 36.0 - 46.0 % 25.1(L) 28.8(L) 27.7(L)  Platelets 150 - 400 K/uL - - -    . CMP Latest Ref Rng & Units 02/16/2020 12/25/2019 10/02/2019  Glucose 70 - 99 mg/dL 140(H) - -  BUN 8 - 23 mg/dL 26(H) 11 15  Creatinine 0.44 - 1.00 mg/dL 0.96 0.7 0.7  Sodium 135 - 145 mmol/L 138 142 141  Potassium 3.5 - 5.1 mmol/L 4.2 4.1 4.1  Chloride 98 - 111 mmol/L 106 106 106  CO2 22 - 32 mmol/L 24 31(A) 30(A)  Calcium 8.9 - 10.3 mg/dL 8.9 9.1 9.2  Total Protein 6.5 - 8.1 g/dL 6.0(L) - -  Total Bilirubin 0.3 - 1.2 mg/dL 0.6 - -  Alkaline Phos 38 - 126 U/L 72 81 71  AST 15 - 41 U/L 18 13 15   ALT 0 - 44 U/L 15 7 10       Studies/Results: No results found.   Carmell Austria MD @  02/16/2020, 5:13 PM

## 2020-02-16 NOTE — ED Triage Notes (Signed)
Pt from [facility] reporting rectal bleeding. They report a large amount of rectal blood when they checked on her tonight. Denies pain. HR was 47 initially when EMS arrived. A&Ox4.

## 2020-02-16 NOTE — H&P (Signed)
History and Physical        Hospital Admission Note Date: 02/16/2020  Patient name: Lisa King Medical record number: [id number] Date of birth: 1931/04/16 Age: 87 y.o. Gender: female  PCP: Mast, Man X, NP  Patient coming from: [facility]   Chief Complaint    Chief Complaint  Patient presents with  . Rectal Bleeding      HPI:   This is an 87 year old female with past medical history of COPD, hemorrhoids, hypertension, hyperlipidemia, GERD who presented from her ALF for evaluation of rectal bleeding.  Patient had a large amount of maroon-colored stool yesterday evening around 6 pm when staff checked on her. She states this happened several times since then. Admits to an episode of nausea and diaphoresis without vomiting when she had a BM which has since resolved. Patient denied any abdominal pain or fevers.  She is not on anticoagulation but does take 2 tablets of Aleve daily for arthritis. States that she had a GI bleed once before several years ago.   Colonoscopy 10/18/2013 with Dr. Leone Payor: -Moderate diverticulosis -Severe melanosis  ED Course: Afebrile, on room air Initial BP 183/58 at 3 AM which dropped to 112/45 throughout the night.. Notable Labs: Sodium 138, K4.2, glucose 140, BUN 26, creatinine 0.96, WBC 8.1, Hb 9.8-> 8.9, COVID-19 pending. Patient received 1 L LR bolus.    Vitals:   02/16/20 0545 02/16/20 0621  BP: (!) 136/55 (!) 112/45  Pulse: (!) 55 (!) 58  Resp: 17 16  Temp:    SpO2: 94% 93%     Review of Systems:  Review of Systems  All other systems reviewed and are negative.   Medical/Social/Family History   Past Medical History: Past Medical History:  Diagnosis Date  . Acute blood loss anemia 08/23/2013   04/13/16 TSH 1.20, Na 141, K 4.2, Bun 15, creat 0.79, wbc 5.5, Hgb 11.5, plt 283  . Anxiety   . Anxiety state 07/02/2007   Qualifier:  Diagnosis of  By: Kriste Basque MD, Lonzo Cloud   . 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 Cigarette smoker   . Colon polyps 2009   TWO TUBULAR ADENOMAS AND HYPERPLASTIC POLYPS  . Constipation 09/14/2013  . COPD (chronic obstructive pulmonary disease) (HCC)   . Diverticulosis of colon   . DJD (degenerative joint disease)   . Dog bite of limb 07/14/2010   Dog bite 07/10/10 - dogs shots utd Last td was 08/2009  Localized tx only.    . Fibromyalgia   . GERD 12/19/2006   Qualifier: Diagnosis of  By: Renaldo Fiddler CMA, Marliss Czar  04/13/16 TSH 1.20, Na 141, K 4.2, Bun 15, creat 0.79, wbc 5.5, Hgb 11.5, plt 283   . GERD (gastroesophageal reflux disease)   . Hammer toe of second toe of left foot 04/08/2016   Left 2nd  . Hearing loss   . Hemorrhoids   . Hypercholesterolemia   . Osteoarthritis 12/19/2006   Qualifier: Diagnosis of  By: Renaldo Fiddler CMA, Marliss Czar    . Osteoporosis     Past Surgical History:  Procedure Laterality Date  . CATARACT EXTRACTION, BILATERAL  2009  . COLONOSCOPY  2009, 2006 , 2017  . stapes surgery     Dr  Dorma Russell  . TONSILLECTOMY AND ADENOIDECTOMY     as a child    Medications: Prior to Admission medications   Medication Sig Start Date End Date Taking? Authorizing Provider  budesonide-formoterol (SYMBICORT) 80-4.5 MCG/ACT inhaler Inhale 2 puffs into the lungs 2 (two) times daily.    [provider]  calcium carbonate (TUMS EX) 750 MG chewable tablet Chew 750 mg by mouth daily.     [provider]  Calcium Carbonate-Vit D-Min (CALCIUM 600+D PLUS MINERALS) 600-400 MG-UNIT TABS Take 1 tablet by mouth daily.     [provider]  cholecalciferol (VITAMIN D) 1000 UNITS tablet Take 1,000 Units by mouth daily.    [provider]  cyclobenzaprine (FLEXERIL) 5 MG tablet Take 2.5 mg by mouth at bedtime.    [provider]  estradiol (ESTRACE) 0.1 MG/GM vaginal cream Place 1 Applicatorful vaginally daily. On Mon,Wed,Fri    [provider]   hydrocortisone cream 1 % Apply 1 application topically 2 (two) times daily as needed. Apply to wrists    [provider]  metoprolol tartrate (LOPRESSOR) 25 MG tablet Take 12.5 mg by mouth 2 (two) times daily.    [provider]  naproxen sodium (ALEVE) 220 MG tablet Take 220 mg by mouth daily as needed. Take 1 tablet at dinner    [provider]  nitrofurantoin (MACRODANTIN) 50 MG capsule Take 50 mg by mouth at bedtime. 10/21/18   [provider]  polyethylene glycol (MIRALAX / GLYCOLAX) 17 g packet Take 17 g by mouth daily as needed. This is in addition to the twice a week dosing.    [provider]  polyethylene glycol (MIRALAX / GLYCOLAX) packet Take 17 g by mouth daily as needed. Once a day on Sun,Wed    [provider]  pregabalin (LYRICA) 50 MG capsule Take 1 capsule (50 mg total) by mouth daily. 01/29/20   Mast, Man X, NP  vitamin B-12 (CYANOCOBALAMIN) 1000 MCG tablet Take 1,000 mcg by mouth daily.    [provider]    Allergies:   Allergies  Allergen Reactions  . Penicillins Anaphylaxis  . Bisphosphonates Other (See Comments)    [name]  . Influenza Vaccines     Pt reported  . Simvastatin     Unknown   . Trazodone And Nefazodone     Felt her throat was closing up/kept her awake  . Sulfonamide Derivatives Rash and Other (See Comments)    blisters    Social History:  reports that she quit smoking about 10 years ago. Her smoking use included cigarettes. She has never used smokeless tobacco. She reports current alcohol use. She reports that she does not use drugs.  Family History: Family History  Problem Relation Age of Onset  . Heart disease Father   . COPD Father   . Hypertension Mother   . Arthritis Mother      Objective   Physical Exam: Blood pressure (!) 112/45, pulse (!) 58, temperature 98 F (36.7 C), temperature source Oral, resp. rate 16, SpO2 93 %.  Physical Exam Vitals and nursing  note reviewed.  Constitutional:      Appearance: Normal appearance.  HENT:     Head: Normocephalic and atraumatic.  Eyes:     Conjunctiva/sclera: Conjunctivae normal.  Cardiovascular:     Rate and Rhythm: Normal rate and regular rhythm.     Heart sounds: Murmur heard.   Systolic murmur is present with a grade of 3/6.   Pulmonary:  Effort: Pulmonary effort is normal.     Breath sounds: Normal breath sounds.  Abdominal:     General: Abdomen is flat.     Palpations: Abdomen is soft.     Comments: Generalized abdominal discomfort with palpation  Musculoskeletal:        General: No swelling or tenderness.  Skin:    Coloration: Skin is pale. Skin is not jaundiced.  Neurological:     Mental Status: She is alert. Mental status is at baseline.  Psychiatric:        Mood and Affect: Mood normal.        Behavior: Behavior normal.     LABS on Admission: I have personally reviewed all the labs and imaging below    Basic Metabolic Panel: Recent Labs  Lab 02/16/20 0408  NA 138  K 4.2  CL 106  CO2 24  GLUCOSE 140*  BUN 26*  CREATININE 0.96  CALCIUM 8.9   Liver Function Tests: Recent Labs  Lab 02/16/20 0408  AST 18  ALT 15  ALKPHOS 72  BILITOT 0.6  PROT 6.0*  ALBUMIN 3.8   No results for input(s): LIPASE, AMYLASE in the last 168 hours. No results for input(s): AMMONIA in the last 168 hours. CBC: Recent Labs  Lab 02/16/20 0408 02/16/20 0550  WBC 8.1  --   HGB 9.8* 8.9*  HCT 30.1* 27.7*  MCV 94.7  --   PLT 218  --    Cardiac Enzymes: No results for input(s): CKTOTAL, CKMB, CKMBINDEX, TROPONINI in the last 168 hours. BNP: Invalid input(s): POCBNP CBG: No results for input(s): GLUCAP in the last 168 hours.  Radiological Exams on Admission:  No results found.    EKG: Not done   A & P   Principal Problem:   GI bleed Active Problems:   COPD (chronic obstructive pulmonary disease) with chronic bronchitis (HCC)   ABLA (acute blood loss anemia)    Hypertension   Systolic murmur   1. Acute blood loss anemia secondary to GI bleed in the setting of NSAID use and diverticulosis a. BPs trending downward throughout the night but currently stable b. Likely had a vasovagal event during one of her BMs per history - monitor c. Hb 9.8->8.9 in 2 hours d. Trend H&H  e. Patient consented to transfusions as needed f. Protonix IV twice daily g. Hold NSAIDs h. GI consult  2. Hypertension a. Hold Lopressor  3. Fibromyalgia a. Holding Lyrica  4. Systolic murmur a. No echo on file b. Outpatient follow up  5. COPD a. Symbicort changed to [facility] while inpatient   DVT prophylaxis: SCDs   Code Status: Prior  Diet: N.p.o. Family Communication: Admission, patients condition and plan of care including tests being ordered have been discussed with the patient who indicates understanding and agrees with the plan and Code Status. Disposition Plan: The appropriate patient status for this patient is INPATIENT. Inpatient status is judged to be reasonable and necessary in order to provide the required intensity of service to ensure the patient's safety. The patient's presenting symptoms, physical exam findings, and initial radiographic and laboratory data in the context of their chronic comorbidities is felt to place them at high risk for further clinical deterioration. Furthermore, it is not anticipated that the patient will be medically stable for discharge from the hospital within 2 midnights of admission. The following factors support the patient status of inpatient.   " The patient's presenting symptoms include GI bleed. " The worrisome physical exam  findings include pallor, murmur. " The initial radiographic and laboratory data are worrisome because of anemia. " The chronic co-morbidities include diverticulosis, COPD.   * I certify that at the point of admission it is my clinical judgment that the patient will require inpatient hospital care  spanning beyond 2 midnights from the point of admission due to high intensity of service, high risk for further deterioration and high frequency of surveillance required.*   Status is: Inpatient  Remains inpatient appropriate because:Ongoing diagnostic testing needed not appropriate for outpatient work up, IV treatments appropriate due to intensity of illness or inability to take PO and Inpatient level of care appropriate due to severity of illness   Dispo: The patient is from: ALF              Anticipated d/c is to: ALF              Anticipated d/c date is: 2 days              Patient currently is not medically stable to d/c.      Consultants  . GI  Procedures  . None  Time Spent on Admission: 60 minutes    Jae Dire, DO [facility]  02/16/2020, 7:51 AM

## 2020-02-16 NOTE — ED Notes (Signed)
Assisted Dr. Judd Lien with POC rectal occult, Grayling Congress, NT

## 2020-02-16 NOTE — H&P (View-Only) (Signed)
Referring Provider: [facility]  Primary Care Physician:  Mast, Man X, NP Primary Gastroenterologist: Dr. Carlean Purl MD  Reason for Consultation: GI bleed   ASSESSMENT AND PLAN:    GI bleed - Upper vs lower. Hb 10.9 --> 8.1. BUN 26. H/O NSAIDs.  Certainly, could represent diverticular bleed.  Last colon 10/2013 with mod sigmoid div.Good prep  Plan: -IV Protonix -Trend CBC. Keep Hb>7 -EGD in AM. -She is not keen on getting colon done. -If brisk bleeding, CTA followed by IR embolization if + -Clear liquid diet.  N.p.o. after midnight.         HPI: Lisa King is a 87 y.o. female  Resident of NH With H/O COPD, HTN, HLD, chronic anemia, fibromyalgia Admitted with rectal bleeding-melanotic stools with some maroon-colored stools.  None since this morning. She denies having any abdominal pain.  No nausea, vomiting.  Has been taking 2 tablets of Aleve per day for arthritis.  Also has longstanding history of gastroesophageal reflux.  No fever chills or night sweats.  No weight loss.    Previous colonoscopy 10/2013; 1. Moderate diverticulosis was noted in the sigmoid colon 2. Severe melanosis was found throughout the entire examined colon 3. The colon was redundant 4. The colon mucosa was otherwise normal - adequate pep Past Medical History:  Diagnosis Date  . Acute blood loss anemia 08/23/2013   04/13/16 TSH 1.20, Na 141, K 4.2, Bun 15, creat 0.79, wbc 5.5, Hgb 11.5, plt 283  . Anxiety   . Anxiety state 07/02/2007   Qualifier: Diagnosis of  By: Lenna Gilford MD, Deborra Medina   . 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 Cigarette smoker   . Colon polyps 2009   TWO TUBULAR ADENOMAS AND HYPERPLASTIC POLYPS  . Constipation 09/14/2013  . COPD (chronic obstructive pulmonary disease) ([place])   . Diverticulosis of colon   . DJD (degenerative joint disease)   . Dog bite of limb 07/14/2010   Dog bite 07/10/10 - dogs shots utd Last td was 08/2009  Localized tx only.    . Fibromyalgia    . GERD 12/19/2006   Qualifier: Diagnosis of  By: Julien Girt CMA, Marliss Czar  04/13/16 TSH 1.20, Na 141, K 4.2, Bun 15, creat 0.79, wbc 5.5, Hgb 11.5, plt 283   . GERD (gastroesophageal reflux disease)   . Hammer toe of second toe of left foot 04/08/2016   Left 2nd  . Hearing loss   . Hemorrhoids   . Hypercholesterolemia   . Osteoarthritis 12/19/2006   Qualifier: Diagnosis of  By: Julien Girt CMA, Marliss Czar    . Osteoporosis     Past Surgical History:  Procedure Laterality Date  . CATARACT EXTRACTION, BILATERAL  2009  . COLONOSCOPY  2009, 2006 , 2017  . stapes surgery     Dr Thornell Mule  . TONSILLECTOMY AND ADENOIDECTOMY     as a child    Prior to Admission medications   Medication Sig Start Date End Date Taking? Authorizing Provider  budesonide-formoterol (SYMBICORT) 80-4.5 MCG/ACT inhaler Inhale 1 puff into the lungs 2 (two) times daily.   Yes [provider]  calcium carbonate (TUMS EX) 750 MG chewable tablet Chew 750 mg by mouth daily.    Yes [provider]  Calcium Carbonate-Vit D-Min (CALCIUM 600+D PLUS MINERALS) 600-400 MG-UNIT TABS Take 1 tablet by mouth daily.    Yes [provider]  cholecalciferol (VITAMIN D) 1000 UNITS tablet Take 1,000 Units by mouth daily.   Yes [provider]  cyclobenzaprine (FLEXERIL)  5 MG tablet Take 2.5 mg by mouth at bedtime.   Yes [provider]  estradiol (ESTRACE) 0.1 MG/GM vaginal cream Place 1 Applicatorful vaginally every Monday, Wednesday, and Friday.   Yes [provider]  hydrocortisone cream 1 % Apply 1 application topically 2 (two) times daily as needed (wrists).   Yes [provider]  metoprolol tartrate (LOPRESSOR) 25 MG tablet Take 12.5 mg by mouth 2 (two) times daily.   Yes [provider]  naproxen sodium (ALEVE) 220 MG tablet Take 220 mg by mouth daily as needed (pain).   Yes [provider]  naproxen sodium (ALEVE) 220 MG tablet Take 220 mg by mouth at bedtime.   Yes  [provider]  nitrofurantoin (MACRODANTIN) 50 MG capsule Take 50 mg by mouth at bedtime. 10/21/18  Yes [provider]  polyethylene glycol (MIRALAX / GLYCOLAX) 17 g packet Take 17 g by mouth 2 (two) times a week. Sunday and Wednesday   Yes [provider]  polyethylene glycol (MIRALAX / GLYCOLAX) packet Take 17 g by mouth daily as needed for mild constipation.   Yes [provider]  pregabalin (LYRICA) 50 MG capsule Take 1 capsule (50 mg total) by mouth daily. 01/29/20  Yes Mast, Man X, NP  vitamin B-12 (CYANOCOBALAMIN) 1000 MCG tablet Take 1,000 mcg by mouth daily.   Yes [provider]    Current Facility-Administered Medications  Medication Dose Route Frequency Provider Last Rate Last Admin  . fluticasone furoate-vilanterol (BREO ELLIPTA) 100-25 MCG/INH 1 puff  1 puff Inhalation Daily Jae Dire, MD      . pantoprazole (PROTONIX) injection 40 mg  40 mg Intravenous Q12H Jae Dire, MD   40 mg at 02/16/20 1127  . sodium chloride flush (NS) 0.9 % injection 3 mL  3 mL Intravenous Q12H Jae Dire, MD   3 mL at 02/16/20 1128    Allergies as of 02/16/2020 - Review Complete 02/16/2020  Allergen Reaction Noted  . Penicillins Anaphylaxis 08/23/2013  . Bisphosphonates Other (See Comments)   . Influenza vaccines  03/04/2015  . Simvastatin    . Trazodone and nefazodone  09/01/2015  . Sulfonamide derivatives Rash and Other (See Comments)     Family History  Problem Relation Age of Onset  . Heart disease Father   . COPD Father   . Hypertension Mother   . Arthritis Mother     Social History   Socioeconomic History  . Marital status: Widowed    Spouse name: Not on file  . Number of children: Not on file  . Years of education: Not on file  . Highest education level: Not on file  Occupational History  . Occupation: retired  . Occupation: works some weekends at [employer]  . Smoking status: Former Smoker     Types: Cigarettes    Quit date: 02/15/2010    Years since quitting: 10.0  . Smokeless tobacco: Never Used  Substance and Sexual Activity  . Alcohol use: Yes    Comment: occasional glass of wine  . Drug use: No  . Sexual activity: Never  Other Topics Concern  . Not on file  Social History Narrative   Pt works some weekends at [employer]   Caffeine use - daily coffee in the mornings   Patient gets regular exercise > tries to walk daily for exercise   Moved to [facility] [place] 03/11/16   Widowed   Former Smoker-stopped 2012  Alcohol occasionally glass of wine   Living Will   Social Determinants of Health   Financial Resource Strain: Not on file  Food Insecurity: Not on file  Transportation Needs: Not on file  Physical Activity: Not on file  Stress: Not on file  Social Connections: Not on file  Intimate Partner Violence: Not on file     Review of Systems: Not obtained since patient wanted to sleep.   Physical Exam: Vital signs in last 24 hours: Temp:  [97.5 F (36.4 C)-98 F (36.7 C)] 97.5 F (36.4 C) (01/01 1641) Pulse Rate:  [51-87] 72 (01/01 1641) Resp:  [15-21] 21 (01/01 1641) BP: (112-183)/(45-69) 122/55 (01/01 1641) SpO2:  [93 %-100 %] 100 % (01/01 1641) Weight:  [60.3 kg] 60.3 kg (01/01 0829) Last BM Date: 02/16/20 Wt Readings from Last 3 Encounters:  02/16/20 60.3 kg  01/15/20 60.8 kg  12/24/19 60.8 kg   General:   Alert HEENT: Normal. No icterus. Abdomen:  Soft, non-distended, nontender, BS active, no palp mass    Rectal:  Deferred  . Neurologic:  Alert;  grossly normal neurologically. Skin:  Intact without significant lesions or rashes..   Intake/Output from previous day: No intake/output data recorded. Intake/Output this shift: Total I/O In: 610 [P.O.:600; I.V.:10] Out: -   Lab Results: Recent Labs    02/16/20 0408 02/16/20 0550 02/16/20 0900 02/16/20 1455  WBC 8.1  --   --   --   HGB 9.8* 8.9* 9.3* 8.1*  HCT 30.1* 27.7*  28.8* 25.1*  PLT 218  --   --   --    BMET Recent Labs    02/16/20 0408  NA 138  K 4.2  CL 106  CO2 24  GLUCOSE 140*  BUN 26*  CREATININE 0.96  CALCIUM 8.9   LFT Recent Labs    02/16/20 0408  PROT 6.0*  ALBUMIN 3.8  AST 18  ALT 15  ALKPHOS 72  BILITOT 0.6   PT/INR No results for input(s): LABPROT, INR in the last 72 hours. Hepatitis Panel No results for input(s): HEPBSAG, HCVAB, HEPAIGM, HEPBIGM in the last 72 hours.   . CBC Latest Ref Rng & Units 02/16/2020 02/16/2020 02/16/2020  WBC 4.0 - 10.5 K/uL - - -  Hemoglobin 12.0 - 15.0 g/dL 8.1(L) 9.3(L) 8.9(L)  Hematocrit 36.0 - 46.0 % 25.1(L) 28.8(L) 27.7(L)  Platelets 150 - 400 K/uL - - -    . CMP Latest Ref Rng & Units 02/16/2020 12/25/2019 10/02/2019  Glucose 70 - 99 mg/dL 140(H) - -  BUN 8 - 23 mg/dL 26(H) 11 15  Creatinine 0.44 - 1.00 mg/dL 0.96 0.7 0.7  Sodium 135 - 145 mmol/L 138 142 141  Potassium 3.5 - 5.1 mmol/L 4.2 4.1 4.1  Chloride 98 - 111 mmol/L 106 106 106  CO2 22 - 32 mmol/L 24 31(A) 30(A)  Calcium 8.9 - 10.3 mg/dL 8.9 9.1 9.2  Total Protein 6.5 - 8.1 g/dL 6.0(L) - -  Total Bilirubin 0.3 - 1.2 mg/dL 0.6 - -  Alkaline Phos 38 - 126 U/L 72 81 71  AST 15 - 41 U/L 18 13 15   ALT 0 - 44 U/L 15 7 10       Studies/Results: No results found.   Carmell Austria MD @  02/16/2020, 5:13 PM

## 2020-02-16 NOTE — ED Provider Notes (Signed)
[facility] DEPT Provider Note   CSN: [id number] Arrival date & time: 02/16/20  0251     History Chief Complaint  Patient presents with  . Rectal Bleeding    Lisa King is a 87 y.o. female.  Patient is an 87 year old female sent from her assisted living facility for evaluation of rectal bleeding.  From what I am told, there was a large amount of maroon-colored stool this evening when they checked on her.  Patient denies to me she is having any abdominal pain or fevers.  Patient not on any blood thinners.  The history is provided by the patient.  Rectal Bleeding Quality:  Maroon Amount:  Moderate Duration:  3 hours Timing:  Constant Chronicity:  New Relieved by:  Nothing Worsened by:  Nothing Ineffective treatments:  None tried Associated symptoms: no abdominal pain and no fever   Risk factors: no anticoagulant use        Past Medical History:  Diagnosis Date  . Acute blood loss anemia 08/23/2013   04/13/16 TSH 1.20, Na 141, K 4.2, Bun 15, creat 0.79, wbc 5.5, Hgb 11.5, plt 283  . Anxiety   . Anxiety state 07/02/2007   Qualifier: Diagnosis of  By: Lenna Gilford MD, Deborra Medina   . 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 Cigarette smoker   . Colon polyps 2009   TWO TUBULAR ADENOMAS AND HYPERPLASTIC POLYPS  . Constipation 09/14/2013  . COPD (chronic obstructive pulmonary disease) ([place])   . Diverticulosis of colon   . DJD (degenerative joint disease)   . Dog bite of limb 07/14/2010   Dog bite 07/10/10 - dogs shots utd Last td was 08/2009  Localized tx only.    . Fibromyalgia   . GERD 12/19/2006   Qualifier: Diagnosis of  By: Julien Girt CMA, Marliss Czar  04/13/16 TSH 1.20, Na 141, K 4.2, Bun 15, creat 0.79, wbc 5.5, Hgb 11.5, plt 283   . GERD (gastroesophageal reflux disease)   . Hammer toe of second toe of left foot 04/08/2016   Left 2nd  . Hearing loss   . Hemorrhoids   . Hypercholesterolemia   . Osteoarthritis 12/19/2006   Qualifier: Diagnosis of   By: Julien Girt CMA, Marliss Czar    . Osteoporosis     Patient Active Problem List   Diagnosis Date Noted  . Chronic anemia 10/16/2019  . Cough 10/25/2018  . Dysuria 09/12/2018  . Atrophic vaginitis 06/28/2018  . Hypertension 11/15/2017  . Insomnia secondary to anxiety 07/21/2017  . Bilateral dry eyes 02/17/2017  . UTI (urinary tract infection) 09/17/2016  . 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 Hammer toe of second toe of left foot 04/08/2016  . 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 Arteriovenous fistula ([place]) 01/13/2016  . Personal history of colonic polyps 09/14/2013  . Hearing loss 09/05/2008  . Anxiety state 07/02/2007  . HYPERCHOLESTEROLEMIA 06/19/2007  . COPD (chronic obstructive pulmonary disease) with chronic bronchitis ([place]) 12/19/2006  . GERD 12/19/2006  . Osteoarthritis 12/19/2006  . Fibromyalgia 12/19/2006  . Osteoporosis 12/19/2006    Past Surgical History:  Procedure Laterality Date  . CATARACT EXTRACTION, BILATERAL  2009  . COLONOSCOPY  2009, 2006 , 2017  . stapes surgery     Dr Thornell Mule  . TONSILLECTOMY AND ADENOIDECTOMY     as a child     OB History   No obstetric history on file.     Family History  Problem Relation Age of Onset  . Heart disease Father   .  COPD Father   . Hypertension Mother   . Arthritis Mother     Social History   Tobacco Use  . Smoking status: Former Smoker    Types: Cigarettes    Quit date: 02/15/2010    Years since quitting: 10.0  . Smokeless tobacco: Never Used  Substance Use Topics  . Alcohol use: Yes    Comment: occasional glass of wine  . Drug use: No    Home Medications Prior to Admission medications   Medication Sig Start Date End Date Taking? Authorizing Provider  budesonide-formoterol (SYMBICORT) 80-4.5 MCG/ACT inhaler Inhale 2 puffs into the lungs 2 (two) times daily.    [provider]  calcium carbonate (TUMS EX) 750 MG chewable tablet Chew 750 mg by mouth daily.     [provider]  Calcium Carbonate-Vit D-Min (CALCIUM 600+D PLUS MINERALS) 600-400 MG-UNIT TABS Take 1 tablet by mouth daily.     [provider]  cholecalciferol (VITAMIN D) 1000 UNITS tablet Take 1,000 Units by mouth daily.    [provider]  cyclobenzaprine (FLEXERIL) 5 MG tablet Take 2.5 mg by mouth at bedtime.    [provider]  estradiol (ESTRACE) 0.1 MG/GM vaginal cream Place 1 Applicatorful vaginally daily. On Mon,Wed,Fri    [provider]  hydrocortisone cream 1 % Apply 1 application topically 2 (two) times daily as needed. Apply to wrists    [provider]  metoprolol tartrate (LOPRESSOR) 25 MG tablet Take 12.5 mg by mouth 2 (two) times daily.    [provider]  naproxen sodium (ALEVE) 220 MG tablet Take 220 mg by mouth daily as needed. Take 1 tablet at dinner    [provider]  nitrofurantoin (MACRODANTIN) 50 MG capsule Take 50 mg by mouth at bedtime. 10/21/18   [provider]  polyethylene glycol (MIRALAX / GLYCOLAX) 17 g packet Take 17 g by mouth daily as needed. This is in addition to the twice a week dosing.    [provider]  polyethylene glycol (MIRALAX / GLYCOLAX) packet Take 17 g by mouth daily as needed. Once a day on Sun,Wed    [provider]  pregabalin (LYRICA) 50 MG capsule Take 1 capsule (50 mg total) by mouth daily. 01/29/20   Mast, Man X, NP  vitamin B-12 (CYANOCOBALAMIN) 1000 MCG tablet Take 1,000 mcg by mouth daily.    [provider]    Allergies    Penicillins, Bisphosphonates, Influenza vaccines, Simvastatin, Trazodone and nefazodone, and Sulfonamide derivatives  Review of Systems   Review of Systems  Constitutional: Negative for fever.  Gastrointestinal: Positive for hematochezia. Negative for abdominal pain.  All other systems reviewed and are negative.   Physical Exam Updated Vital Signs BP (!) 136/48 (BP Location: Right Arm)   Pulse (!) 51   Temp 98  F (36.7 C) (Oral)   Resp 19   SpO2 98%   Physical Exam Vitals and nursing note reviewed.  Constitutional:      General: She is not in acute distress.    Appearance: She is well-developed and well-nourished. She is not diaphoretic.  HENT:     Head: Normocephalic and atraumatic.  Cardiovascular:     Rate and Rhythm: Normal rate and regular rhythm.     Heart sounds: No murmur heard. No friction rub. No gallop.   Pulmonary:     Effort: Pulmonary effort is normal. No respiratory distress.     Breath sounds: Normal breath sounds. No wheezing.  Abdominal:  General: Bowel sounds are normal. There is no distension.     Palpations: Abdomen is soft.     Tenderness: There is no abdominal tenderness.  Genitourinary:    Comments: Patient with obviously bloody maroon-colored stool on digital rectal exam.  It is Hemoccult positive.  There are no palpable masses and no obvious hemorrhoids or fissures. Musculoskeletal:        General: Normal range of motion.     Cervical back: Normal range of motion and neck supple.  Skin:    General: Skin is warm and dry.  Neurological:     Mental Status: She is alert and oriented to person, place, and time.     ED Results / Procedures / Treatments   Labs (all labs ordered are listed, but only abnormal results are displayed) Labs Reviewed  COMPREHENSIVE METABOLIC PANEL - Abnormal; Notable for the following components:      Result Value   Glucose, Bld 140 (*)    BUN 26 (*)    Total Protein 6.0 (*)    GFR, Estimated 58 (*)    All other components within normal limits  CBC - Abnormal; Notable for the following components:   RBC 3.18 (*)    Hemoglobin 9.8 (*)    HCT 30.1 (*)    All other components within normal limits  POC OCCULT BLOOD, ED  TYPE AND SCREEN    EKG None  Radiology No results found.  Procedures Procedures (including critical care time)  Medications Ordered in ED Medications - No data to display  ED Course  I have  reviewed the triage vital signs and the nursing notes.  Pertinent labs & imaging results that were available during my care of the patient were reviewed by me and considered in my medical decision making (see chart for details).    MDM Rules/Calculators/A&P  Patient is a an 87 year old female presenting with complaints of bloody bowel movement.  She was found at her assisted living facility to have had a large maroon-colored bowel movement.  Rectal examination reveals grossly bloody, heme positive stool.  Patient's initial hemoglobin is 9.8 which appears to be a drop from her baseline.  2 hours later, a second hemoglobin was obtained and was 8.9.  Patient's abdominal exam is benign.  She has no white count and no fever.  I feel as though she should be admitted for observation.  I have spoken with Dr. Rachael Darby who agrees to admit.  Final Clinical Impression(s) / ED Diagnoses Final diagnoses:  None    Rx / DC Orders ED Discharge Orders    None       Geoffery Lyons, MD 02/16/20 [phone]

## 2020-02-17 ENCOUNTER — Inpatient Hospital Stay (HOSPITAL_COMMUNITY): Payer: Medicare Other

## 2020-02-17 ENCOUNTER — Inpatient Hospital Stay (HOSPITAL_COMMUNITY): Payer: Medicare Other | Admitting: Certified Registered Nurse Anesthetist

## 2020-02-17 ENCOUNTER — Encounter (HOSPITAL_COMMUNITY)
Admission: EM | Disposition: A | Discharge status: Skilled Nursing Facility | Payer: Self-pay | Source: Home / Self Care | Attending: Internal Medicine

## 2020-02-17 ENCOUNTER — Encounter (HOSPITAL_COMMUNITY): Payer: Self-pay | Admitting: Family Medicine

## 2020-02-17 DIAGNOSIS — K254 Chronic or unspecified gastric ulcer with hemorrhage: Secondary | ICD-10-CM | POA: Diagnosis not present

## 2020-02-17 DIAGNOSIS — J449 Chronic obstructive pulmonary disease, unspecified: Secondary | ICD-10-CM | POA: Diagnosis not present

## 2020-02-17 DIAGNOSIS — D62 Acute posthemorrhagic anemia: Secondary | ICD-10-CM | POA: Diagnosis not present

## 2020-02-17 DIAGNOSIS — K259 Gastric ulcer, unspecified as acute or chronic, without hemorrhage or perforation: Secondary | ICD-10-CM

## 2020-02-17 DIAGNOSIS — I10 Essential (primary) hypertension: Secondary | ICD-10-CM | POA: Diagnosis not present

## 2020-02-17 HISTORY — PX: BIOPSY: SHX5522

## 2020-02-17 HISTORY — PX: IR ANGIOGRAM SELECTIVE EACH ADDITIONAL VESSEL: IMG667

## 2020-02-17 HISTORY — PX: IR US GUIDE VASC ACCESS RIGHT: IMG2390

## 2020-02-17 HISTORY — PX: ESOPHAGOGASTRODUODENOSCOPY (EGD) WITH PROPOFOL: SHX5813

## 2020-02-17 HISTORY — PX: IR ANGIOGRAM VISCERAL SELECTIVE: IMG657

## 2020-02-17 LAB — CBC
HCT: 23.4 % — ABNORMAL LOW (ref 36.0–46.0)
Hemoglobin: 7.6 g/dL — ABNORMAL LOW (ref 12.0–15.0)
MCH: 30.4 pg (ref 26.0–34.0)
MCHC: 32.5 g/dL (ref 30.0–36.0)
MCV: 93.6 fL (ref 80.0–100.0)
Platelets: 184 10*3/uL (ref 150–400)
RBC: 2.5 MIL/uL — ABNORMAL LOW (ref 3.87–5.11)
RDW: 14.5 % (ref 11.5–15.5)
WBC: 8.3 10*3/uL (ref 4.0–10.5)
nRBC: 0 % (ref 0.0–0.2)

## 2020-02-17 LAB — BASIC METABOLIC PANEL
Anion gap: 7 (ref 5–15)
BUN: 22 mg/dL (ref 8–23)
CO2: 24 mmol/L (ref 22–32)
Calcium: 8.5 mg/dL — ABNORMAL LOW (ref 8.9–10.3)
Chloride: 111 mmol/L (ref 98–111)
Creatinine, Ser: 0.57 mg/dL (ref 0.44–1.00)
GFR, Estimated: >60 mL/min (ref >60)
Glucose, Bld: 104 mg/dL — ABNORMAL HIGH (ref 70–99)
Potassium: 3.8 mmol/L (ref 3.5–5.1)
Sodium: 142 mmol/L (ref 135–145)

## 2020-02-17 LAB — PREPARE RBC (CROSSMATCH)

## 2020-02-17 LAB — HEMOGLOBIN AND HEMATOCRIT, BLOOD
HCT: 24.5 % — ABNORMAL LOW (ref 36.0–46.0)
Hemoglobin: 7.7 g/dL — ABNORMAL LOW (ref 12.0–15.0)

## 2020-02-17 LAB — MRSA PCR SCREENING: MRSA by PCR: NEGATIVE

## 2020-02-17 SURGERY — ESOPHAGOGASTRODUODENOSCOPY (EGD) WITH PROPOFOL
Anesthesia: Monitor Anesthesia Care

## 2020-02-17 MED ORDER — IOHEXOL 350 MG/ML SOLN
100.0000 mL | Freq: Once | INTRAVENOUS | Status: AC | PRN
  Administered 2020-02-17: 100 mL via INTRAVENOUS

## 2020-02-17 MED ORDER — IOHEXOL 300 MG/ML  SOLN
100.0000 mL | Freq: Once | INTRAMUSCULAR | Status: AC | PRN
  Administered 2020-02-17: 30 mL via INTRA_ARTERIAL

## 2020-02-17 MED ORDER — CHLORHEXIDINE GLUCONATE CLOTH 2 % EX PADS
6.0000 | MEDICATED_PAD | Freq: Every day | CUTANEOUS | Status: DC
  Administered 2020-02-19 – 2020-02-21 (×3): 6 via TOPICAL

## 2020-02-17 MED ORDER — ONDANSETRON HCL 4 MG/2ML IJ SOLN: 4.0000 mg | Freq: Four times a day (QID) | INTRAMUSCULAR | Status: DC | PRN

## 2020-02-17 MED ORDER — SODIUM CHLORIDE 0.9% IV SOLUTION: Freq: Once | INTRAVENOUS | Status: DC

## 2020-02-17 MED ORDER — MIDAZOLAM HCL 2 MG/2ML IJ SOLN
INTRAMUSCULAR | Status: AC
  Filled 2020-02-17: qty 2

## 2020-02-17 MED ORDER — PROPOFOL 500 MG/50ML IV EMUL
INTRAVENOUS | Status: DC | PRN
  Administered 2020-02-17: 125 ug/kg/min via INTRAVENOUS

## 2020-02-17 MED ORDER — PANTOPRAZOLE SODIUM 40 MG PO TBEC
40.0000 mg | DELAYED_RELEASE_TABLET | Freq: Every day | ORAL | Status: DC
  Administered 2020-02-18 – 2020-02-21 (×4): 40 mg via ORAL
  Filled 2020-02-17 (×6): qty 1

## 2020-02-17 MED ORDER — PROPOFOL 10 MG/ML IV BOLUS
INTRAVENOUS | Status: DC | PRN
  Administered 2020-02-17: 30 mg via INTRAVENOUS

## 2020-02-17 MED ORDER — SODIUM CHLORIDE 0.9% IV SOLUTION: Freq: Once | INTRAVENOUS | Status: AC

## 2020-02-17 MED ORDER — IOHEXOL 300 MG/ML  SOLN
100.0000 mL | Freq: Once | INTRAMUSCULAR | Status: AC | PRN
  Administered 2020-02-17: 100 mL via INTRA_ARTERIAL

## 2020-02-17 MED ORDER — SODIUM CHLORIDE 0.9 % IV BOLUS
500.0000 mL | Freq: Once | INTRAVENOUS | Status: AC
  Administered 2020-02-17: 500 mL via INTRAVENOUS

## 2020-02-17 MED ORDER — LIDOCAINE 2% (20 MG/ML) 5 ML SYRINGE
INTRAMUSCULAR | Status: DC | PRN
  Administered 2020-02-17: 60 mg via INTRAVENOUS

## 2020-02-17 MED ORDER — FENTANYL CITRATE (PF) 100 MCG/2ML IJ SOLN
INTRAMUSCULAR | Status: AC
  Filled 2020-02-17: qty 2

## 2020-02-17 MED ORDER — MIDAZOLAM HCL 2 MG/2ML IJ SOLN
INTRAMUSCULAR | Status: AC | PRN
  Administered 2020-02-17 (×2): 0.5 mg via INTRAVENOUS

## 2020-02-17 MED ORDER — LACTATED RINGERS IV SOLN: INTRAVENOUS | Status: DC | PRN

## 2020-02-17 MED ORDER — LIDOCAINE HCL 1 % IJ SOLN
INTRAMUSCULAR | Status: AC
  Filled 2020-02-17: qty 20

## 2020-02-17 SURGICAL SUPPLY — 15 items

## 2020-02-17 NOTE — Op Note (Signed)
[facility] Patient Name: Lisa King Procedure Date: 02/17/2020 MRN: [id number] Attending MD: Lynann Bologna , MD Date of Birth: [birth year] CSN: [id number] Age: 87 Admit Type: Inpatient Procedure:                Upper GI endoscopy Indications:              Recent gastrointestinal bleeding Providers:                Lynann Bologna, MD, Janae Sauce. Steele Berg, RN, Brion Aliment, Technician, Nadene Rubins Referring MD:              Medicines:                Monitored Anesthesia Care Complications:            No immediate complications. Estimated Blood Loss:     Estimated blood loss: none. Procedure:                Pre-Anesthesia Assessment:                           - Prior to the procedure, a History and Physical                            was performed, and patient medications and                            allergies were reviewed. The patient's tolerance of                            previous anesthesia was also reviewed. The risks                            and benefits of the procedure and the sedation                            options and risks were discussed with the patient.                            All questions were answered, and informed consent                            was obtained. Prior Anticoagulants: The patient has                            taken no previous anticoagulant or antiplatelet                            agents. ASA Grade Assessment: III - A patient with                            severe systemic disease. After reviewing the risks  and benefits, the patient was deemed in                            satisfactory condition to undergo the procedure.                           After obtaining informed consent, the endoscope was                            passed under direct vision. Throughout the                            procedure, the patient's blood pressure, pulse, and                             oxygen saturations were monitored continuously. The                            GIF-H190 ([id number]) Olympus gastroscope was                            introduced through the mouth, and advanced to the                            second part of duodenum. The upper GI endoscopy was                            accomplished without difficulty. The patient                            tolerated the procedure well. Scope In: Scope Out: Findings:      Few patchy, white plaques were found in the upper third of the esophagus       and in the middle third of the esophagus. Brushings were obtained.       Z-line was well-defined at 38 cm. Examined by NBI      Three non-bleeding cratered gastric ulcers 6-8 mm with no stigmata of       bleeding were found in the gastric antrum. Biopsies were taken with a       cold forceps for histology. Estimated blood loss: none.      The examined duodenum was normal.      No blood was noted in the upper GI tract. Impression:               - Non-bleeding gastric ulcers with no stigmata of                            bleeding. Biopsied.                           - ?Mild Candida esophagitis (brushed) Moderate Sedation:      Not Applicable - Patient had care per Anesthesia. Recommendation:           - Return patient to hospital ward for ongoing care.                           -  Advance diet as tolerated.                           - Continue Protonix 40 mg p.o. once a day.                           - Await pathology results. If brushings are                            positive for Candida, will call in Diflucan.                           - Rinse/gargle after steroid inhalers.                           - Patient was not keen on getting colonoscopy                            performed.                           - Avoid nonsteroidals, if possible.                           - Trend CBC. If Hb stable in a.m. and no further                            bleeding, can D/C to SNF.                            - The findings and recommendations were discussed                            with the patient. Procedure Code(s):        --- Professional ---                           [phone], Esophagogastroduodenoscopy, flexible,                            transoral; with biopsy, single or multiple Diagnosis Code(s):        --- Professional ---                           K22.9, Disease of esophagus, unspecified                           K25.9, Gastric ulcer, unspecified as acute or                            chronic, without hemorrhage or perforation                           K92.2, Gastrointestinal hemorrhage, unspecified CPT copyright 2019 American Medical Association. All rights reserved. The codes documented in this report are preliminary and upon coder review may  be revised  to meet current compliance requirements. Lynann Bologna, MD 02/17/2020 1:36:02 PM This report has been signed electronically. Number of Addenda: 0

## 2020-02-17 NOTE — Transfer of Care (Signed)
Immediate Anesthesia Transfer of Care Note  Patient: Lisa King  Procedure(s) Performed: ESOPHAGOGASTRODUODENOSCOPY (EGD) WITH PROPOFOL (N/A )  Patient Location: PACU  Anesthesia Type:MAC  Level of Consciousness: awake and patient cooperative  Airway & Oxygen Therapy: Patient Spontanous Breathing and Patient connected to face mask  Post-op Assessment: Report given to RN and Post -op Vital signs reviewed and stable  Post vital signs: Reviewed and stable  Last Vitals:  Vitals Value Taken Time  BP    Temp    Pulse 76 02/17/20 1330  Resp 19 02/17/20 1330  SpO2 100 % 02/17/20 1330  Vitals shown include unvalidated device data.  Last Pain:  Vitals:   02/17/20 1226  TempSrc: Oral  PainSc: 0-No pain         Complications: No complications documented.

## 2020-02-17 NOTE — Progress Notes (Addendum)
I was on the floor seeing another patient and RN notified that patient had just passed a large amount of blood and felt unstable on toilet. Patient was very pale on the toilet when I arrive. There was a large amount of bright red blood in the toilet and a blood covered piece of stool. SBP had declined from 150's to 102. She was transitioned back to bed. Not tachycardic.  This isn't an upper bleed. Her EGD today showed only shallow gastric ulcers without stigmata of recent bleeding. Suspect diverticular bleed. BP came up once she was back in bed.   I ordered a small amount of IV fluid ( 500cc) and will order CTA.  Stat H+ H, she may need transfusion as hgb this am was only 7.6.      Attending physician's note     CTA positive for active likely diverticular bleed in the sigmoid area  I have paged IR  Edman Circle, MD [facility] [phone]   D/W Dr Archer Asa from IR. For IR embolization Discussed with Dr Tyson Babinski. He will send him the information  RG

## 2020-02-17 NOTE — Anesthesia Preprocedure Evaluation (Addendum)
Anesthesia Evaluation  Patient identified by MRN, date of birth, ID band Patient awake    Reviewed: Allergy & Precautions, H&P , NPO status , Patient's Chart, lab work & pertinent test results, reviewed documented beta blocker date and time   Airway Mallampati: I  TM Distance: >3 FB Neck ROM: full    Dental no notable dental hx. (+) Edentulous Upper, Edentulous Lower   Pulmonary COPD,  COPD inhaler, former smoker,    Pulmonary exam normal breath sounds clear to auscultation       Cardiovascular Exercise Tolerance: Good hypertension, Pt. on medications and Pt. on home beta blockers + Valvular Problems/Murmurs  Rhythm:regular Rate:Normal     Neuro/Psych PSYCHIATRIC DISORDERS Anxiety Schizophrenia  Neuromuscular disease    GI/Hepatic Neg liver ROS, GERD  ,  Endo/Other  negative endocrine ROS  Renal/GU negative Renal ROS  negative genitourinary   Musculoskeletal  (+) Arthritis , Osteoarthritis,  Fibromyalgia -  Abdominal   Peds  Hematology  (+) Blood dyscrasia, anemia ,   Anesthesia Other Findings   Reproductive/Obstetrics negative OB ROS                            Anesthesia Physical Anesthesia Plan  ASA: III and emergent  Anesthesia Plan: MAC   Post-op Pain Management:    Induction:   PONV Risk Score and Plan: 2  Airway Management Planned: Nasal Cannula, Natural Airway, Simple Face Mask and Mask  Additional Equipment: None  Intra-op Plan:   Post-operative Plan:   Informed Consent: I have reviewed the patients History and Physical, chart, labs and discussed the procedure including the risks, benefits and alternatives for the proposed anesthesia with the patient or authorized representative who has indicated his/her understanding and acceptance.     Dental Advisory Given  Plan Discussed with: CRNA and Anesthesiologist  Anesthesia Plan Comments:        Anesthesia Quick  Evaluation

## 2020-02-17 NOTE — Anesthesia Postprocedure Evaluation (Signed)
Anesthesia Post Note  Patient: Lisa King  Procedure(s) Performed: ESOPHAGOGASTRODUODENOSCOPY (EGD) WITH PROPOFOL (N/A )     Patient location during evaluation: PACU Anesthesia Type: MAC Level of consciousness: awake and alert Pain management: pain level controlled Vital Signs Assessment: post-procedure vital signs reviewed and stable Respiratory status: spontaneous breathing, nonlabored ventilation, respiratory function stable and patient connected to nasal cannula oxygen Cardiovascular status: stable and blood pressure returned to baseline Postop Assessment: no apparent nausea or vomiting Anesthetic complications: no   No complications documented.  Last Vitals:  Vitals:   02/17/20 1533 02/17/20 1722  BP: (!) 137/37 (!) 170/69  Pulse: 82 82  Resp:    Temp:    SpO2: 100% 100%    Last Pain:  Vitals:   02/17/20 1423  TempSrc: Oral  PainSc:                  [name]

## 2020-02-17 NOTE — Progress Notes (Addendum)
PROGRESS NOTE  Lisa King YQM:[id number] DOB: 01/31/1932 DOA: 02/16/2020 PCP: Mast, Man X, NP   LOS: 1 day   Brief narrative: As per HPI,  Patient is a 87 year old female with past medical history of COPD, hemorrhoids, hypertension, hyperlipidemia, GERD who presented from ALF for evaluation of rectal bleeding.  Patient had a large amount of maroon-colored stool day prior to presentation with frequent episodes after that..  Patient denied any abdominal pain or fevers.  She is not on anticoagulation but does take 2 tablets of Aleve daily for arthritis. States that she had a GI bleed once before several years ago.   History of colonoscopy in 2015 with moderate diverticulosis.  Patient was then admitted to hospital for evaluation of GI bleed.  Assessment/Plan:  Principal Problem:   GI bleed Active Problems:   COPD (chronic obstructive pulmonary disease) with chronic bronchitis (HCC)   ABLA (acute blood loss anemia)   Hypertension   Systolic murmur  Acute blood loss anemia secondary to GI bleed. history of diverticulosis and NSAID use.  Blood pressure was initially low which has been improving at this time.  Latest hemoglobin of 7.6 .  Continue Protonix IV twice a day.  GI on board.  Plan for EGD today.  Keep n.p.o.  Hemodynamically stable at this time.  PRBC as needed.  Essential hypertension  Lopressor on hold at this time.  Will closely monitor.  Fibromyalgia  Lyrica on hold.   COPD Compensated, Symbicort changed to Earlie Server while inpatient  DVT prophylaxis: SCDs Start: 02/16/20 0753  Addendum:  02/17/2020 6:36 PM  Radiology notified to me that there was active bleed in colon in the CTA. Spoke with Dr Chales Abrahams GI on call. He has  touched base with IR for further care. Informed patients daughter about the plan for potential embolization. Transfuse I more unit of blood. So far hemodynamically stable. Will transfer to stepdown unit. Spoke with nursing staff and updated the plan.    Code Status: DNR  Family Communication:  Spoke with patient's daughter.  Status is: Inpatient  Remains inpatient appropriate because:IV treatments appropriate due to intensity of illness or inability to take PO, Inpatient level of care appropriate due to severity of illness and GI bleed   Dispo: The patient is from: [facility]              Anticipated d/c is to: ALF              Anticipated d/c date is: 2 days              Patient currently is not medically stable to d/c.   Consultants:  GI  Procedures:  None yet.  Plan for EGD  Antibiotics:  . None  Anti-infectives (From admission, onward)   None       Subjective: Today, patient was seen and examined at bedside.  Patient denies any abdominal pain but complains of having bloody bowel movement.  No nausea vomiting today.   Objective: Vitals:   02/17/20 0514 02/17/20 0753  BP: (!) 137/57   Pulse: 81   Resp: 16   Temp: 97.9 F (36.6 C)   SpO2: 96% 95%    Intake/Output Summary (Last 24 hours) at 02/17/2020 0756 Last data filed at 02/16/2020 2257 Gross per 24 hour  Intake 853 ml  Output -  Net 853 ml   Filed Weights   02/16/20 0829  Weight: 60.3 kg   Body mass index is 24.31 kg/m.  Physical Exam:  GENERAL: Patient is alert awake and communicative,. Not in obvious distress. HENT: Mild pallor noted,  Pupils equally reactive to light. Oral mucosa is moist NECK: is supple, no gross swelling noted. CHEST: Clear to auscultation. No crackles or wheezes.  Diminished breath sounds bilaterally. CVS: S1 and S2 heard, systolic murmur. Regular rate and rhythm.  ABDOMEN: Soft, non-tender, bowel sounds are present.,  Mild left lower quadrant tenderness. EXTREMITIES: No edema. CNS: Cranial nerves are intact. No focal motor deficits. SKIN: warm and dry without rashes.  Data Review: I have personally reviewed the following laboratory data and studies,  CBC: Recent Labs  Lab 02/16/20 0408 02/16/20 0550  02/16/20 0900 02/16/20 1455 02/17/20 0526  WBC 8.1  --   --   --  8.3  HGB 9.8* 8.9* 9.3* 8.1* 7.6*  HCT 30.1* 27.7* 28.8* 25.1* 23.4*  MCV 94.7  --   --   --  93.6  PLT 218  --   --   --  184   Basic Metabolic Panel: Recent Labs  Lab 02/16/20 0408 02/17/20 0526  NA 138 142  K 4.2 3.8  CL 106 111  CO2 24 24  GLUCOSE 140* 104*  BUN 26* 22  CREATININE 0.96 0.57  CALCIUM 8.9 8.5*   Liver Function Tests: Recent Labs  Lab 02/16/20 0408  AST 18  ALT 15  ALKPHOS 72  BILITOT 0.6  PROT 6.0*  ALBUMIN 3.8   No results for input(s): LIPASE, AMYLASE in the last 168 hours. No results for input(s): AMMONIA in the last 168 hours. Cardiac Enzymes: No results for input(s): CKTOTAL, CKMB, CKMBINDEX, TROPONINI in the last 168 hours. BNP (last 3 results) No results for input(s): BNP in the last 8760 hours.  ProBNP (last 3 results) No results for input(s): PROBNP in the last 8760 hours.  CBG: No results for input(s): GLUCAP in the last 168 hours. Recent Results (from the past 240 hour(s))  Resp Panel by RT-PCR (Flu A&B, Covid) Nasopharyngeal Swab     Status: None   Collection Time: 02/16/20  7:00 AM   Specimen: Nasopharyngeal Swab; Nasopharyngeal(NP) swabs in vial transport medium  Result Value Ref Range Status   SARS Coronavirus 2 by RT PCR NEGATIVE NEGATIVE Final    Comment: (NOTE) SARS-CoV-2 target nucleic acids are NOT DETECTED.  The SARS-CoV-2 RNA is generally detectable in upper respiratory specimens during the acute phase of infection. The lowest concentration of SARS-CoV-2 viral copies this assay can detect is 138 copies/mL. A negative result does not preclude SARS-Cov-2 infection and should not be used as the sole basis for treatment or other patient management decisions. A negative result may occur with  improper specimen collection/handling, submission of specimen other than nasopharyngeal swab, presence of viral mutation(s) within the areas targeted by this  assay, and inadequate number of viral copies(<138 copies/mL). A negative result must be combined with clinical observations, patient history, and epidemiological information. The expected result is Negative.  Fact Sheet for Patients:  BloggerCourse.com  Fact Sheet for Healthcare Providers:  SeriousBroker.it  This test is no t yet approved or cleared by the [place] FDA and  has been authorized for detection and/or diagnosis of SARS-CoV-2 by FDA under an Emergency Use Authorization (EUA). This EUA will remain  in effect (meaning this test can be used) for the duration of the COVID-19 declaration under Section 564(b)(1) of the Act, 21 U.S.C.section 360bbb-3(b)(1), unless the authorization is terminated  or revoked sooner.  Influenza A by PCR NEGATIVE NEGATIVE Final   Influenza B by PCR NEGATIVE NEGATIVE Final    Comment: (NOTE) The Xpert Xpress SARS-CoV-2/FLU/RSV plus assay is intended as an aid in the diagnosis of influenza from Nasopharyngeal swab specimens and should not be used as a sole basis for treatment. Nasal washings and aspirates are unacceptable for Xpert Xpress SARS-CoV-2/FLU/RSV testing.  Fact Sheet for Patients: BloggerCourse.com  Fact Sheet for Healthcare Providers: SeriousBroker.it  This test is not yet approved or cleared by the [place] FDA and has been authorized for detection and/or diagnosis of SARS-CoV-2 by FDA under an Emergency Use Authorization (EUA). This EUA will remain in effect (meaning this test can be used) for the duration of the COVID-19 declaration under Section 564(b)(1) of the Act, 21 U.S.C. section 360bbb-3(b)(1), unless the authorization is terminated or revoked.  Performed at [facility], [address] [place], [place] [address]      Studies: No results found.    Joycelyn Das, MD  [facility] 02/17/2020  If 7PM-7AM, please contact night-coverage

## 2020-02-17 NOTE — Anesthesia Procedure Notes (Signed)
Procedure Name: MAC Date/Time: 02/17/2020 1:16 PM Performed by: Claudia Desanctis, CRNA Pre-anesthesia Checklist: Patient identified, Emergency Drugs available, Suction available and Patient being monitored Patient Re-evaluated:Patient Re-evaluated prior to induction Oxygen Delivery Method: Simple face mask

## 2020-02-17 NOTE — Consult Note (Signed)
Chief Complaint: Patient was seen in consultation today for  Chief Complaint  Patient presents with  . Rectal Bleeding   at the request of Dr. Edman Circle  Referring Physician(s): Lynann Bologna, MD  Patient Status: [facility] - [facility]-pt  History of Present Illness: Lisa King is a 87 y.o. female currently admitted for hematochezia and acute blood loss anemia.  CTA showed diverticulosis and active extravasation of contrast in the sigmoid colon.  IR consulted for angiogram and embolization.   I have reviewed her CTA and there is indeed active bleeding in the sigmoid colon. She has moderate atherosclerotic disease but no occlusions.  There is mild aortic ectasia. She is pale but awake and alert.  No pain or active complaints. She states that she feels she is still bleeding.   Past Medical History:  Diagnosis Date  . Acute blood loss anemia 08/23/2013   04/13/16 TSH 1.20, Na 141, K 4.2, Bun 15, creat 0.79, wbc 5.5, Hgb 11.5, plt 283  . Anxiety   . Anxiety state 07/02/2007   Qualifier: Diagnosis of  By: Kriste Basque MD, Lonzo Cloud   . Asthma   . 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 Cigarette smoker   . Colon polyps 2009   TWO TUBULAR ADENOMAS AND HYPERPLASTIC POLYPS  . Constipation 09/14/2013  . COPD (chronic obstructive pulmonary disease) (HCC)   . Diverticulosis of colon   . DJD (degenerative joint disease)   . Dog bite of limb 07/14/2010   Dog bite 07/10/10 - dogs shots utd Last td was 08/2009  Localized tx only.    . Fibromyalgia   . GERD 12/19/2006   Qualifier: Diagnosis of  By: Renaldo Fiddler CMA, Marliss Czar  04/13/16 TSH 1.20, Na 141, K 4.2, Bun 15, creat 0.79, wbc 5.5, Hgb 11.5, plt 283   . GERD (gastroesophageal reflux disease)   . Hammer toe of second toe of left foot 04/08/2016   Left 2nd  . Hearing loss   . Hemorrhoids   . Hypercholesterolemia   . Hypertension   . Osteoarthritis 12/19/2006   Qualifier: Diagnosis of  By: Renaldo Fiddler CMA, Marliss Czar    . Osteoporosis     Past Surgical History:   Procedure Laterality Date  . CATARACT EXTRACTION, BILATERAL  2009  . COLONOSCOPY  2009, 2006 , 2017  . stapes surgery     Dr Dorma Russell  . TONSILLECTOMY AND ADENOIDECTOMY     as a child    Allergies: Penicillins, Bisphosphonates, Influenza vaccines, Simvastatin, Trazodone and nefazodone, and Sulfonamide derivatives  Medications: Prior to Admission medications   Medication Sig Start Date End Date Taking? Authorizing Provider  budesonide-formoterol (SYMBICORT) 80-4.5 MCG/ACT inhaler Inhale 1 puff into the lungs 2 (two) times daily.   Yes [provider]  calcium carbonate (TUMS EX) 750 MG chewable tablet Chew 750 mg by mouth daily.    Yes [provider]  Calcium Carbonate-Vit D-Min (CALCIUM 600+D PLUS MINERALS) 600-400 MG-UNIT TABS Take 1 tablet by mouth daily.    Yes [provider]  cholecalciferol (VITAMIN D) 1000 UNITS tablet Take 1,000 Units by mouth daily.   Yes [provider]  cyclobenzaprine (FLEXERIL) 5 MG tablet Take 2.5 mg by mouth at bedtime.   Yes [provider]  estradiol (ESTRACE) 0.1 MG/GM vaginal cream Place 1 Applicatorful vaginally every Monday, Wednesday, and Friday.   Yes [provider]  hydrocortisone cream 1 % Apply 1 application topically 2 (two) times daily as needed (wrists).   Yes [provider]  metoprolol tartrate (LOPRESSOR) 25 MG tablet Take 12.5 mg by mouth 2 (two) times daily.   Yes [provider]  naproxen sodium (ALEVE) 220 MG tablet Take 220 mg by mouth daily as needed (pain).   Yes [provider]  naproxen sodium (ALEVE) 220 MG tablet Take 220 mg by mouth at bedtime.   Yes [provider]  nitrofurantoin (MACRODANTIN) 50 MG capsule Take 50 mg by mouth at bedtime. 10/21/18  Yes [provider]  polyethylene glycol (MIRALAX / GLYCOLAX) 17 g packet Take 17 g by mouth 2 (two) times a week. Sunday and Wednesday   Yes [provider]  polyethylene  glycol (MIRALAX / GLYCOLAX) packet Take 17 g by mouth daily as needed for mild constipation.   Yes [provider]  pregabalin (LYRICA) 50 MG capsule Take 1 capsule (50 mg total) by mouth daily. 01/29/20  Yes Mast, Man X, NP  vitamin B-12 (CYANOCOBALAMIN) 1000 MCG tablet Take 1,000 mcg by mouth daily.   Yes [provider]     Family History  Problem Relation Age of Onset  . Heart disease Father   . COPD Father   . Hypertension Mother   . Arthritis Mother     Social History   Socioeconomic History  . Marital status: Widowed    Spouse name: Not on file  . Number of children: Not on file  . Years of education: Not on file  . Highest education level: Not on file  Occupational History  . Occupation: retired  . Occupation: works some weekends at [employer]  . Smoking status: Former Smoker    Types: Cigarettes    Quit date: 02/15/2010    Years since quitting: 10.0  . Smokeless tobacco: Never Used  Substance and Sexual Activity  . Alcohol use: Yes    Comment: occasional glass of wine  . Drug use: No  . Sexual activity: Never  Other Topics Concern  . Not on file  Social History Narrative   Pt works some weekends at [employer]   Caffeine use - daily coffee in the mornings   Patient gets regular exercise > tries to walk daily for exercise   Moved to [place] 03/11/16   Widowed   Former Smoker-stopped 2012   Alcohol occasionally glass of wine   Living Will   Social Determinants of Radio broadcast assistant Strain: Not on file  Food Insecurity: Not on file  Transportation Needs: Not on file  Physical Activity: Not on file  Stress: Not on file  Social Connections: Not on file    Review of Systems: A 12 point ROS discussed and pertinent positives are indicated in the HPI above.  All other systems are negative.  Review of Systems  Vital Signs: BP 135/60   Pulse (!) 103   Temp 98.6 F (37 C) (Oral)   Resp 16    Ht 5\' 2"  (1.575 m)   Wt 60.3 kg   SpO2 97%   BMI 24.31 kg/m   Physical Exam Constitutional:      General: She is not in acute distress.    Appearance: Normal appearance.  HENT:     Head: Normocephalic and atraumatic.  Eyes:     General: No scleral icterus. Cardiovascular:     Rate and Rhythm: Normal rate.  Pulmonary:     Effort: Pulmonary effort is normal.  Abdominal:     General: Abdomen is flat. There is no  distension.     Palpations: Abdomen is soft.  Skin:    General: Skin is warm and dry.     Coloration: Skin is pale.  Neurological:     Mental Status: She is alert and oriented to person, place, and time.  Psychiatric:        Behavior: Behavior normal.     Imaging: CT Angio Abd/Pel w/ and/or w/o  Addendum Date: 02/17/2020   ADDENDUM REPORT: 02/17/2020 18:33 ADDENDUM: Critical Value/emergent results were called by telephone at the time of interpretation on 02/17/2020 at 6:32 pm to provider Dr. Louanne Belton, Who verbally acknowledged these results. Electronically Signed   By: Marijo Conception M.D.   On: 02/17/2020 18:33   Result Date: 02/17/2020 CLINICAL DATA:  Rectal bleeding.  Anemia. EXAM: CTA ABDOMEN AND PELVIS WITHOUT AND WITH CONTRAST TECHNIQUE: Multidetector CT imaging of the abdomen and pelvis was performed using the standard protocol during bolus administration of intravenous contrast. Multiplanar reconstructed images and MIPs were obtained and reviewed to evaluate the vascular anatomy. CONTRAST:  167mL OMNIPAQUE IOHEXOL 350 MG/ML SOLN COMPARISON:  None. FINDINGS: VASCULAR Aorta: Atherosclerosis of thoracic aorta is noted without aneurysm or dissection. Celiac: Patent without evidence of aneurysm, dissection, vasculitis or significant stenosis. SMA: Patent without evidence of aneurysm, dissection, vasculitis or significant stenosis. Renals: 2 right renal arteries are noted which are widely patent without significant stenosis. However, heavily calcified plaque is seen involving  the proximal portion of the left renal artery resulting in moderate to severe focal stenosis. IMA: Patent without evidence of aneurysm, dissection, vasculitis or significant stenosis. Inflow: Common iliac arteries bilaterally are widely patent. However, multiple moderate stenoses are noted in the right external iliac artery secondary to calcified plaque. Multiple moderate stenoses are also noted in the left external iliac artery secondary to calcified plaque. Proximal Outflow: Moderate to severe narrowing is seen involving the visualized portions of the common and superficial femoral arteries bilaterally secondary to calcified plaque. Veins: No obvious venous abnormality within the limitations of this arterial phase study. Review of the MIP images confirms the above findings. NON-VASCULAR Lower chest: Small left pleural effusion is noted with adjacent subsegmental atelectasis. Hepatobiliary: No gallstones or biliary dilatation is noted. Left hepatic cyst is noted. Pancreas: Unremarkable. No pancreatic ductal dilatation or surrounding inflammatory changes. Spleen: Normal in size without focal abnormality. Adrenals/Urinary Tract: Adrenal glands are unremarkable. Kidneys are normal, without renal calculi, focal lesion, or hydronephrosis. Bladder is unremarkable. Stomach/Bowel: Stomach is within normal limits. Appendix appears normal. There is no evidence of bowel obstruction. However, there is seen a small focus of extravasation of contrast in a portion of the proximal sigmoid colon best seen on image number 101 of series 7. This is consistent with active gastrointestinal hemorrhage. Lymphatic: No adenopathy is noted. Reproductive: Uterus and bilateral adnexa are unremarkable. Other: No abdominal wall hernia or abnormality. No abdominopelvic ascites. Musculoskeletal: No acute or significant osseous findings. IMPRESSION: Small focus of extravasation of contrast is seen in a portion of the proximal sigmoid colon  consistent with active gastrointestinal hemorrhage. These results will be called to the ordering clinician or representative by the Radiologist Assistant, and communication documented in the PACS or zVision Dashboard. Severe stenosis is seen involving proximal portion of left renal artery secondary to calcified plaque. Multiple moderate to severe stenoses are noted in both external iliac arteries as well as the proximal femoral artery secondary to calcified plaque. Small left pleural effusion is noted with adjacent subsegmental atelectasis. Aortic Atherosclerosis (ICD10-I70.0). Electronically Signed: By:  Lupita Raider M.D. On: 02/17/2020 18:24    Labs:  CBC: Recent Labs    04/12/19 0000 10/02/19 0000 02/16/20 0408 02/16/20 0550 02/16/20 0900 02/16/20 1455 02/17/20 0526 02/17/20 1528  WBC 5.2 4.6 8.1  --   --   --  8.3  --   HGB 10.9* 10.9* 9.8*   < > 9.3* 8.1* 7.6* 7.7*  HCT 33* 32* 30.1*   < > 28.8* 25.1* 23.4* 24.5*  PLT 213 182 218  --   --   --  184  --    < > = values in this interval not displayed.    COAGS: No results for input(s): INR, APTT in the last 8760 hours.  BMP: Recent Labs    04/12/19 0000 10/02/19 0000 12/25/19 0000 02/16/20 0408 02/17/20 0526  NA 141 141 142 138 142  K 4.0 4.1 4.1 4.2 3.8  CL 107 106 106 106 111  CO2 27* 30* 31* 24 24  GLUCOSE  --   --   --  140* 104*  BUN 15 15 11  26* 22  CALCIUM 8.9 9.2 9.1 8.9 8.5*  CREATININE 0.8 0.7 0.7 0.96 0.57  GFRNONAA 68  --   --  58* >60  GFRAA 79  --   --   --   --     LIVER FUNCTION TESTS: Recent Labs    04/12/19 0000 10/02/19 0000 12/25/19 0000 02/16/20 0408  BILITOT  --   --   --  0.6  AST 13 15 13 18   ALT 7 10 7 15   ALKPHOS 70 71 81 72  PROT  --   --   --  6.0*  ALBUMIN 3.5 3.7 3.6 3.8    TUMOR MARKERS: No results for input(s): AFPTM, CEA, CA199, CHROMGRNA in the last 8760 hours.  Assessment and Plan:  87 year old female with active diverticular bleeding arising from the sigmoid  colon.    1.) To IR for angiogram and possible embolization.   Risks, benefits and alternatives discussed at length.  She wants to proceed.   Thank you for this interesting consult.  I greatly enjoyed meeting Lisa King and look forward to participating in their care.  A copy of this report was sent to the requesting provider on this date.  Electronically Signed: , MD 02/17/2020, 7:38 PM   I spent a total of 20 Minutes in face to face in clinical consultation, greater than 50% of which was counseling/coordinating care for active diverticular bleed.

## 2020-02-17 NOTE — Progress Notes (Signed)
Notified the MD on call about patient having large loose bloody stool.

## 2020-02-17 NOTE — Interval H&P Note (Signed)
History and Physical Interval Note:  02/17/2020 1:09 PM  Lisa King  has presented today for surgery, with the diagnosis of melena.  The various methods of treatment have been discussed with the patient and family. After consideration of risks, benefits and other options for treatment, the patient has consented to  Procedure(s): ESOPHAGOGASTRODUODENOSCOPY (EGD) WITH PROPOFOL (N/A) as a surgical intervention.  The patient's history has been reviewed, patient examined, no change in status, stable for surgery.  I have reviewed the patient's chart and labs.  Questions were answered to the patient's satisfaction.     Lynann Bologna

## 2020-02-17 NOTE — Procedures (Signed)
Interventional Radiology Procedure Note  Procedure: Multi-vessel mesenteric angiogram.  No active bleeding at this time.   Complications: None  Estimated Blood Loss: None  Recommendations: - Extremely challenging and heavily diseased arteries - Trend H&H & transfuse as needed - Repeat angio unlikely to be successful  Signed,  Sterling Big, MD

## 2020-02-17 NOTE — Sedation Documentation (Signed)
No full sedation. Patient tolerated well with just versed 1mg  as requested by MD.

## 2020-02-17 NOTE — Plan of Care (Signed)

## 2020-02-18 DIAGNOSIS — K922 Gastrointestinal hemorrhage, unspecified: Secondary | ICD-10-CM

## 2020-02-18 DIAGNOSIS — D62 Acute posthemorrhagic anemia: Secondary | ICD-10-CM | POA: Diagnosis not present

## 2020-02-18 DIAGNOSIS — J449 Chronic obstructive pulmonary disease, unspecified: Secondary | ICD-10-CM | POA: Diagnosis not present

## 2020-02-18 DIAGNOSIS — K921 Melena: Secondary | ICD-10-CM

## 2020-02-18 DIAGNOSIS — K254 Chronic or unspecified gastric ulcer with hemorrhage: Secondary | ICD-10-CM | POA: Diagnosis not present

## 2020-02-18 DIAGNOSIS — I10 Essential (primary) hypertension: Secondary | ICD-10-CM | POA: Diagnosis not present

## 2020-02-18 LAB — BASIC METABOLIC PANEL
Anion gap: 7 (ref 5–15)
BUN: 16 mg/dL (ref 8–23)
CO2: 21 mmol/L — ABNORMAL LOW (ref 22–32)
Calcium: 7.8 mg/dL — ABNORMAL LOW (ref 8.9–10.3)
Chloride: 108 mmol/L (ref 98–111)
Creatinine, Ser: 0.58 mg/dL (ref 0.44–1.00)
GFR, Estimated: >60 mL/min (ref >60)
Glucose, Bld: 122 mg/dL — ABNORMAL HIGH (ref 70–99)
Potassium: 3.7 mmol/L (ref 3.5–5.1)
Sodium: 136 mmol/L (ref 135–145)

## 2020-02-18 LAB — CBC
HCT: 23.2 % — ABNORMAL LOW (ref 36.0–46.0)
Hemoglobin: 7.7 g/dL — ABNORMAL LOW (ref 12.0–15.0)
MCH: 30.7 pg (ref 26.0–34.0)
MCHC: 33.2 g/dL (ref 30.0–36.0)
MCV: 92.4 fL (ref 80.0–100.0)
Platelets: 165 10*3/uL (ref 150–400)
RBC: 2.51 MIL/uL — ABNORMAL LOW (ref 3.87–5.11)
RDW: 15.4 % (ref 11.5–15.5)
WBC: 9.8 10*3/uL (ref 4.0–10.5)
nRBC: 0 % (ref 0.0–0.2)

## 2020-02-18 NOTE — Progress Notes (Addendum)
PROGRESS NOTE  Lisa King ZOX:[id number] DOB: July 17, 1931 DOA: 02/16/2020 PCP: Mast, Man X, NP   LOS: 2 days   Brief narrative: As per HPI,  Patient is a 87 year old female with past medical history of COPD, hemorrhoids, hypertension, hyperlipidemia, GERD who presented from ALF for evaluation of rectal bleeding.  Patient had a large amount of maroon-colored stool day prior to presentation with frequent episodes after that..  Patient denied any abdominal pain or fevers.  She is not on anticoagulation but does take 2 tablets of Aleve daily for arthritis. States that she had a GI bleed once before several years ago.   History of colonoscopy in 2015 with moderate diverticulosis.  Patient was then admitted to hospital for evaluation of GI bleed.  Assessment/Plan:  Principal Problem:   GI bleed Active Problems:   COPD (chronic obstructive pulmonary disease) with chronic bronchitis (HCC)   ABLA (acute blood loss anemia)   Hypertension   Systolic murmur  Acute blood loss anemia secondary to GI bleed. history of diverticulosis and NSAID use.   Latest hemoglobin of 7.7 after I units of packed RBC transfusion.  Continue p.o. Protonix.  GI on board.  Status post EGD with findings of ulcers which were nonbleeding.  Patient continued to have bleeding after endoscopy which was bright red.  CTA of the abdomen showed possible bleeding from the sigmoid area.  IR was consulted and patient underwent angiogram with multivessel disease no active bleeding was found at the time of angiogram.   CBC Latest Ref Rng & Units 02/18/2020 02/17/2020 02/17/2020  WBC 4.0 - 10.5 K/uL 9.8 - 8.3  Hemoglobin 12.0 - 15.0 g/dL 7.7(L) 7.7(L) 7.6(L)  Hematocrit 36.0 - 46.0 % 23.2(L) 24.5(L) 23.4(L)  Platelets 150 - 400 K/uL 165 - 184    Essential hypertension  Lopressor on hold at this time.  Will closely monitor.  Fibromyalgia  Lyrica on hold.   COPD Compensated, Symbicort changed to Earlie Server while inpatient  DVT  prophylaxis: SCDs Start: 02/16/20 0753  Code Status: DNR  Family Communication:  None today. Spoke with patient's daughter on the phone yesterday.  Status is: Inpatient  Remains inpatient appropriate because:IV treatments appropriate due to intensity of illness or inability to take PO, Inpatient level of care appropriate due to severity of illness and GI bleed   Dispo: The patient is from: [facility]              Anticipated d/c is to: ALF              Anticipated d/c date is: 1 to 2 days if no further bleeding and hemoglobin remained stable.              Patient currently is not medically stable to d/c.   Consultants:  GI  Procedures:  PRBC transfusion 1 units  Selective angiogram of the mesenteric vessels by IR on 1229.  EGD on 1-2/22  Antibiotics:  . None  Anti-infectives (From admission, onward)   None       Subjective: Today, patient was seen and examined at bedside.  Patient denies any abdominal pain nausea vomiting.  Did have a bowel movement yesterday but no further bleeding today.  Patient is willing to try to eat something today  Objective: Vitals:   02/18/20 0400 02/18/20 0600  BP: (!) 110/44 105/81  Pulse: 89 90  Resp: 18 18  Temp: 97.9 F (36.6 C)   SpO2: 97% 93%    Intake/Output Summary (Last 24  hours) at 02/18/2020 0801 Last data filed at 02/18/2020 0000 Gross per 24 hour  Intake 1050 ml  Output -  Net 1050 ml   Filed Weights   02/16/20 0829 02/17/20 1226  Weight: 60.3 kg 60.3 kg   Body mass index is 24.31 kg/m.   Physical Exam:  GENERAL: Patient is alert awake and communicative,. Not in obvious distress. HENT: Mild pallor noted,  Pupils equally reactive to light. Oral mucosa is moist NECK: is supple, no gross swelling noted. CHEST: Clear to auscultation. No crackles or wheezes.  Diminished breath sounds bilaterally. CVS: S1 and S2 heard, systolic murmur. Regular rate and rhythm.  ABDOMEN: Soft, non-tender, bowel sounds are  present.,  Mild left lower quadrant tenderness. EXTREMITIES: No edema. CNS: Cranial nerves are intact. No focal motor deficits. SKIN: warm and dry without rashes.  Data Review: I have personally reviewed the following laboratory data and studies,  CBC: Recent Labs  Lab 02/16/20 0408 02/16/20 0550 02/16/20 0900 02/16/20 1455 02/17/20 0526 02/17/20 1528 02/18/20 0115  WBC 8.1  --   --   --  8.3  --  9.8  HGB 9.8*   < > 9.3* 8.1* 7.6* 7.7* 7.7*  HCT 30.1*   < > 28.8* 25.1* 23.4* 24.5* 23.2*  MCV 94.7  --   --   --  93.6  --  92.4  PLT 218  --   --   --  184  --  165   < > = values in this interval not displayed.   Basic Metabolic Panel: Recent Labs  Lab 02/16/20 0408 02/17/20 0526 02/18/20 0115  NA 138 142 136  K 4.2 3.8 3.7  CL 106 111 108  CO2 24 24 21*  GLUCOSE 140* 104* 122*  BUN 26* 22 16  CREATININE 0.96 0.57 0.58  CALCIUM 8.9 8.5* 7.8*   Liver Function Tests: Recent Labs  Lab 02/16/20 0408  AST 18  ALT 15  ALKPHOS 72  BILITOT 0.6  PROT 6.0*  ALBUMIN 3.8   No results for input(s): LIPASE, AMYLASE in the last 168 hours. No results for input(s): AMMONIA in the last 168 hours. Cardiac Enzymes: No results for input(s): CKTOTAL, CKMB, CKMBINDEX, TROPONINI in the last 168 hours. BNP (last 3 results) No results for input(s): BNP in the last 8760 hours.  ProBNP (last 3 results) No results for input(s): PROBNP in the last 8760 hours.  CBG: No results for input(s): GLUCAP in the last 168 hours. Recent Results (from the past 240 hour(s))  Resp Panel by RT-PCR (Flu A&B, Covid) Nasopharyngeal Swab     Status: None   Collection Time: 02/16/20  7:00 AM   Specimen: Nasopharyngeal Swab; Nasopharyngeal(NP) swabs in vial transport medium  Result Value Ref Range Status   SARS Coronavirus 2 by RT PCR NEGATIVE NEGATIVE Final    Comment: (NOTE) SARS-CoV-2 target nucleic acids are NOT DETECTED.  The SARS-CoV-2 RNA is generally detectable in upper  respiratory specimens during the acute phase of infection. The lowest concentration of SARS-CoV-2 viral copies this assay can detect is 138 copies/mL. A negative result does not preclude SARS-Cov-2 infection and should not be used as the sole basis for treatment or other patient management decisions. A negative result may occur with  improper specimen collection/handling, submission of specimen other than nasopharyngeal swab, presence of viral mutation(s) within the areas targeted by this assay, and inadequate number of viral copies(<138 copies/mL). A negative result must be combined with clinical observations, patient history, and epidemiological  information. The expected result is Negative.  Fact Sheet for Patients:  BloggerCourse.com  Fact Sheet for Healthcare Providers:  SeriousBroker.it  This test is no t yet approved or cleared by the [place] FDA and  has been authorized for detection and/or diagnosis of SARS-CoV-2 by FDA under an Emergency Use Authorization (EUA). This EUA will remain  in effect (meaning this test can be used) for the duration of the COVID-19 declaration under Section 564(b)(1) of the Act, 21 U.S.C.section 360bbb-3(b)(1), unless the authorization is terminated  or revoked sooner.       Influenza A by PCR NEGATIVE NEGATIVE Final   Influenza B by PCR NEGATIVE NEGATIVE Final    Comment: (NOTE) The Xpert Xpress SARS-CoV-2/FLU/RSV plus assay is intended as an aid in the diagnosis of influenza from Nasopharyngeal swab specimens and should not be used as a sole basis for treatment. Nasal washings and aspirates are unacceptable for Xpert Xpress SARS-CoV-2/FLU/RSV testing.  Fact Sheet for Patients: BloggerCourse.com  Fact Sheet for Healthcare Providers: SeriousBroker.it  This test is not yet approved or cleared by the [place] FDA and has been  authorized for detection and/or diagnosis of SARS-CoV-2 by FDA under an Emergency Use Authorization (EUA). This EUA will remain in effect (meaning this test can be used) for the duration of the COVID-19 declaration under Section 564(b)(1) of the Act, 21 U.S.C. section 360bbb-3(b)(1), unless the authorization is terminated or revoked.  Performed at [facility], [address] [place], [place] [address]   MRSA PCR Screening     Status: None   Collection Time: 02/17/20  9:41 PM   Specimen: Nasopharyngeal  Result Value Ref Range Status   MRSA by PCR NEGATIVE NEGATIVE Final    Comment:        The GeneXpert MRSA Assay (FDA approved for NASAL specimens only), is one component of a comprehensive MRSA colonization surveillance program. It is not intended to diagnose MRSA infection nor to guide or monitor treatment for MRSA infections. Performed at [facility], [address], [place], [place] [address]      Studies: CT Angio Abd/Pel w/ and/or w/o  Addendum Date: 02/17/2020   ADDENDUM REPORT: 02/17/2020 18:33 ADDENDUM: Critical Value/emergent results were called by telephone at the time of interpretation on 02/17/2020 at 6:32 pm to provider Dr. Tyson Babinski, Who verbally acknowledged these results. Electronically Signed   By: Lupita Raider M.D.   On: 02/17/2020 18:33   Result Date: 02/17/2020 CLINICAL DATA:  Rectal bleeding.  Anemia. EXAM: CTA ABDOMEN AND PELVIS WITHOUT AND WITH CONTRAST TECHNIQUE: Multidetector CT imaging of the abdomen and pelvis was performed using the standard protocol during bolus administration of intravenous contrast. Multiplanar reconstructed images and MIPs were obtained and reviewed to evaluate the vascular anatomy. CONTRAST:  OMNIPAQUE IOHEXOL 350 MG/ML SOLN COMPARISON:  None. FINDINGS: VASCULAR Aorta: Atherosclerosis of thoracic aorta is noted without aneurysm or dissection. Celiac: Patent without evidence of aneurysm,  dissection, vasculitis or significant stenosis. SMA: Patent without evidence of aneurysm, dissection, vasculitis or significant stenosis. Renals: 2 right renal arteries are noted which are widely patent without significant stenosis. However, heavily calcified plaque is seen involving the proximal portion of the left renal artery resulting in moderate to severe focal stenosis. IMA: Patent without evidence of aneurysm, dissection, vasculitis or significant stenosis. Inflow: Common iliac arteries bilaterally are widely patent. However, multiple moderate stenoses are noted in the right external iliac artery secondary to calcified plaque. Multiple moderate stenoses are also noted  in the left external iliac artery secondary to calcified plaque. Proximal Outflow: Moderate to severe narrowing is seen involving the visualized portions of the common and superficial femoral arteries bilaterally secondary to calcified plaque. Veins: No obvious venous abnormality within the limitations of this arterial phase study. Review of the MIP images confirms the above findings. NON-VASCULAR Lower chest: Small left pleural effusion is noted with adjacent subsegmental atelectasis. Hepatobiliary: No gallstones or biliary dilatation is noted. Left hepatic cyst is noted. Pancreas: Unremarkable. No pancreatic ductal dilatation or surrounding inflammatory changes. Spleen: Normal in size without focal abnormality. Adrenals/Urinary Tract: Adrenal glands are unremarkable. Kidneys are normal, without renal calculi, focal lesion, or hydronephrosis. Bladder is unremarkable. Stomach/Bowel: Stomach is within normal limits. Appendix appears normal. There is no evidence of bowel obstruction. However, there is seen a small focus of extravasation of contrast in a portion of the proximal sigmoid colon best seen on image number 101 of series 7. This is consistent with active gastrointestinal hemorrhage. Lymphatic: No adenopathy is noted. Reproductive: Uterus  and bilateral adnexa are unremarkable. Other: No abdominal wall hernia or abnormality. No abdominopelvic ascites. Musculoskeletal: No acute or significant osseous findings. IMPRESSION: Small focus of extravasation of contrast is seen in a portion of the proximal sigmoid colon consistent with active gastrointestinal hemorrhage. These results will be called to the ordering clinician or representative by the Radiologist Assistant, and communication documented in the PACS or zVision Dashboard. Severe stenosis is seen involving proximal portion of left renal artery secondary to calcified plaque. Multiple moderate to severe stenoses are noted in both external iliac arteries as well as the proximal femoral artery secondary to calcified plaque. Small left pleural effusion is noted with adjacent subsegmental atelectasis. Aortic Atherosclerosis (ICD10-I70.0). Electronically Signed: By: Lupita Raider M.D. On: 02/17/2020 18:24      Joycelyn Das, MD  [facility] 02/18/2020  If 7PM-7AM, please contact night-coverage

## 2020-02-18 NOTE — Progress Notes (Addendum)
Progress Note   Subjective  Chief Complaint: GI bleed  This morning, the patient tells me that she feels well.  Per nursing she had a very small bowel movement with just a small amount of bright red blood overnight, but nothing today.  She did have 1 unit of PRBCs with a hemoglobin that was unchanged 7.6--> 7.7.  Tells me she is going to try and eat something for breakfast though "I have not been able to hold anything on my stomach for a few days".   Objective   Vital signs in last 24 hours: Temp:  [97.3 F (36.3 C)-98.6 F (37 C)] 97.3 F (36.3 C) (01/03 0800) Pulse Rate:  [71-103] 87 (01/03 0800) Resp:  [15-23] 22 (01/03 0800) BP: (102-170)/(26-84) 143/59 (01/03 0800) SpO2:  [92 %-100 %] 96 % (01/03 0800) Weight:  [60.3 kg] 60.3 kg (01/02 1226) Last BM Date: 02/17/19 General:    Elderly pale appearing white female in NAD Heart:  Regular rate and rhythm; no murmurs Lungs: Respirations even and unlabored, lungs CTA bilaterally Abdomen:  Soft, nontender and nondistended. Normal bowel sounds. Psych:  Cooperative. Normal mood and affect.  Intake/Output from previous day: 01/02 0701 - 01/03 0700 In: 1050 [P.O.:480; Blood:570] Out: -  Intake/Output this shift: Total I/O In: -  Out: 450 [Urine:450]  Lab Results: Recent Labs    02/16/20 0408 02/16/20 0550 02/17/20 0526 02/17/20 1528 02/18/20 0115  WBC 8.1  --  8.3  --  9.8  HGB 9.8*   < > 7.6* 7.7* 7.7*  HCT 30.1*   < > 23.4* 24.5* 23.2*  PLT 218  --  184  --  165   < > = values in this interval not displayed.   BMET Recent Labs    02/16/20 0408 02/17/20 0526 02/18/20 0115  NA 138 142 136  K 4.2 3.8 3.7  CL 106 111 108  CO2 24 24 21*  GLUCOSE 140* 104* 122*  BUN 26* 22 16  CREATININE 0.96 0.57 0.58  CALCIUM 8.9 8.5* 7.8*   LFT Recent Labs    02/16/20 0408  PROT 6.0*  ALBUMIN 3.8  AST 18  ALT 15  ALKPHOS 72  BILITOT 0.6   Studies/Results: IR Angiogram Visceral Selective  Result Date:  02/18/2020 INDICATION: 87 year old female with acute sigmoid diverticular bleed. She presents for arteriogram and possible embolization. EXAM: SELECTIVE VISCERAL ARTERIOGRAPHY; IR ULTRASOUND GUIDANCE VASC ACCESS RIGHT; ADDITIONAL ARTERIOGRAPHY MEDICATIONS: None. ANESTHESIA/SEDATION: 1 mg Versed administered for anxiolysis. CONTRAST:  29mL OMNIPAQUE IOHEXOL 300 MG/ML SOLN, 137mL OMNIPAQUE IOHEXOL 300 MG/ML SOLN, 50mL OMNIPAQUE IOHEXOL 300 MG/ML SOLN, 62mL OMNIPAQUE IOHEXOL 300 MG/ML SOLN FLUOROSCOPY TIME:  Fluoroscopy Time: 25 minutes 12 seconds (1901 mGy). COMPLICATIONS: None immediate. PROCEDURE: Informed consent was obtained from the patient following explanation of the procedure, risks, benefits and alternatives. The patient understands, agrees and consents for the procedure. All questions were addressed. A time out was performed prior to the initiation of the procedure. Maximal barrier sterile technique utilized including caps, mask, sterile gowns, sterile gloves, large sterile drape, hand hygiene, and Betadine prep. The right common femoral artery was interrogated with ultrasound and found to be widely patent. An image was obtained and stored for the medical record. Local anesthesia was attained by infiltration with 1% lidocaine. A small dermatotomy was made. Under real-time sonographic guidance, the vessel was punctured with a 21 gauge micropuncture needle. Using standard technique, the initial micro needle was exchanged over a 0.018 micro wire for a transitional  4 French micro sheath. The micro sheath was then exchanged over a 0.035 wire for a 5 French vascular sheath. Initially, a RIM catheter was introduced into the abdominal aorta over a Bentson wire. The aorta is tortuous and variant in caliber with portions that are ectatic, and portions that are small. Heavily calcified atherosclerotic plaque noted throughout. The origin of the inferior mesenteric artery could not be engaged with this catheter.  Therefore, the catheter was exchanged for a Mickelson catheter. The Mickelson catheter was formed. Again, the origin of the IMA could not be successfully engaged. The Mickelson catheter was then exchanged for a Sos Omni selective catheter. Despite multiple attempts, the origin of the IMA could not be successfully identified through the heavily calcified plaque. At this point, the decision was made to pursue angiography of the superior mesenteric artery to see if there is a communication into the IMA territory via the middle colic artery. A C2 cobra catheter was advanced over the Bentson wire and used to select the superior mesenteric artery. An angiogram was performed. No obvious communication with the IMA. No evidence of active bleeding. A renegade STC microcatheter was advanced over a Fathom 16 wire and used to select a proximal branch arising from the IMA. Arteriography was performed. This is a jejunal branch with normal opacification of the jejunal mucosa. No evidence of active bleeding. No communication with the IMA territory. The microcatheter was used to select another branch artery. Contrast injection demonstrates filling of the redundant transverse colon. This appears to be the left middle colic artery colic artery. No evidence of active bleeding. The microcatheter was advanced further into the unnamed branch supplying the distal transverse colon. Super selective arteriography was performed. Again, no evidence of active bleeding. No communication with the main IMA territory. At this time, the decision was made to go back to attempting to catheterize the inferior mesenteric artery as this would provide the arterial supply to the region of active bleeding seen on the recent CT arteriogram. The Sos microcatheter was reintroduced. Additional arteriography was performed in multiple obliquities in till the origin of the IMA was successfully identified. Due to stenosis at the origin, the catheter tip could not be  engaged in the artery. However, the catheter was positioned just outside the arterial ostium and ultimately, the renegade STC microcatheter and Fathom wire were successfully floated into the inferior mesenteric artery. Arteriography was then performed. No evidence of active bleeding on initial angiography. Due to the heavily stenotic origin, opacification of the arteries is somewhat limited. Therefore, the microcatheter was advanced more distally into a sigmoid branch of the IMA. Contrast injection was performed and demonstrates no evidence of active bleeding. The microcatheter was next advanced into the left colic artery. Contrast injection was performed. Again, no evidence of active bleeding. The patient's diverticular bleed appears to have spontaneously stopped. Catheters were removed. Hemostasis was attained with the aid of a Celt closure device. IMPRESSION: 1. Heavily diseased and calcified arteries. Catheterization of the inferior mesenteric artery was extremely challenging but ultimately successful. 2. No evidence of active bleeding at the time of arteriography. Signed, Criselda Peaches, MD, [place] [facility] Radiology Electronically Signed   By: Jacqulynn Cadet M.D.   On: 02/18/2020 08:29   IR Angiogram Visceral Selective  Result Date: 02/18/2020 INDICATION: 87 year old female with acute sigmoid diverticular bleed. She presents for arteriogram and possible embolization. EXAM: SELECTIVE VISCERAL ARTERIOGRAPHY; IR ULTRASOUND GUIDANCE VASC ACCESS RIGHT; ADDITIONAL ARTERIOGRAPHY MEDICATIONS: None. ANESTHESIA/SEDATION: 1  mg Versed administered for anxiolysis. CONTRAST:  11mL OMNIPAQUE IOHEXOL 300 MG/ML SOLN, 134mL OMNIPAQUE IOHEXOL 300 MG/ML SOLN, 27mL OMNIPAQUE IOHEXOL 300 MG/ML SOLN, 53mL OMNIPAQUE IOHEXOL 300 MG/ML SOLN FLUOROSCOPY TIME:  Fluoroscopy Time: 25 minutes 12 seconds (1901 mGy). COMPLICATIONS: None immediate. PROCEDURE: Informed consent was  obtained from the patient following explanation of the procedure, risks, benefits and alternatives. The patient understands, agrees and consents for the procedure. All questions were addressed. A time out was performed prior to the initiation of the procedure. Maximal barrier sterile technique utilized including caps, mask, sterile gowns, sterile gloves, large sterile drape, hand hygiene, and Betadine prep. The right common femoral artery was interrogated with ultrasound and found to be widely patent. An image was obtained and stored for the medical record. Local anesthesia was attained by infiltration with 1% lidocaine. A small dermatotomy was made. Under real-time sonographic guidance, the vessel was punctured with a 21 gauge micropuncture needle. Using standard technique, the initial micro needle was exchanged over a 0.018 micro wire for a transitional 4 [place] micro sheath. The micro sheath was then exchanged over a 0.035 wire for a 5 French vascular sheath. Initially, a RIM catheter was introduced into the abdominal aorta over a Bentson wire. The aorta is tortuous and variant in caliber with portions that are ectatic, and portions that are small. Heavily calcified atherosclerotic plaque noted throughout. The origin of the inferior mesenteric artery could not be engaged with this catheter. Therefore, the catheter was exchanged for a Mickelson catheter. The Mickelson catheter was formed. Again, the origin of the IMA could not be successfully engaged. The Mickelson catheter was then exchanged for a Sos Omni selective catheter. Despite multiple attempts, the origin of the IMA could not be successfully identified through the heavily calcified plaque. At this point, the decision was made to pursue angiography of the superior mesenteric artery to see if there is a communication into the IMA territory via the middle colic artery. A C2 cobra catheter was advanced over the Bentson wire and used to select the superior  mesenteric artery. An angiogram was performed. No obvious communication with the IMA. No evidence of active bleeding. A renegade STC microcatheter was advanced over a Fathom 16 wire and used to select a proximal branch arising from the IMA. Arteriography was performed. This is a jejunal branch with normal opacification of the jejunal mucosa. No evidence of active bleeding. No communication with the IMA territory. The microcatheter was used to select another branch artery. Contrast injection demonstrates filling of the redundant transverse colon. This appears to be the left middle colic artery colic artery. No evidence of active bleeding. The microcatheter was advanced further into the unnamed branch supplying the distal transverse colon. Super selective arteriography was performed. Again, no evidence of active bleeding. No communication with the main IMA territory. At this time, the decision was made to go back to attempting to catheterize the inferior mesenteric artery as this would provide the arterial supply to the region of active bleeding seen on the recent CT arteriogram. The Sos microcatheter was reintroduced. Additional arteriography was performed in multiple obliquities in till the origin of the IMA was successfully identified. Due to stenosis at the origin, the catheter tip could not be engaged in the artery. However, the catheter was positioned just outside the arterial ostium and ultimately, the renegade STC microcatheter and Fathom wire were successfully floated into the inferior mesenteric artery. Arteriography was then performed. No evidence of active bleeding on initial angiography. Due to  the heavily stenotic origin, opacification of the arteries is somewhat limited. Therefore, the microcatheter was advanced more distally into a sigmoid branch of the IMA. Contrast injection was performed and demonstrates no evidence of active bleeding. The microcatheter was next advanced into the left colic artery.  Contrast injection was performed. Again, no evidence of active bleeding. The patient's diverticular bleed appears to have spontaneously stopped. Catheters were removed. Hemostasis was attained with the aid of a Celt closure device. IMPRESSION: 1. Heavily diseased and calcified arteries. Catheterization of the inferior mesenteric artery was extremely challenging but ultimately successful. 2. No evidence of active bleeding at the time of arteriography. Signed, Criselda Peaches, MD, [place] [facility] Radiology Electronically Signed   By: Jacqulynn Cadet M.D.   On: 02/18/2020 08:29   IR Angiogram Selective Each Additional Vessel  Result Date: 02/18/2020 INDICATION: 87 year old female with acute sigmoid diverticular bleed. She presents for arteriogram and possible embolization. EXAM: SELECTIVE VISCERAL ARTERIOGRAPHY; IR ULTRASOUND GUIDANCE VASC ACCESS RIGHT; ADDITIONAL ARTERIOGRAPHY MEDICATIONS: None. ANESTHESIA/SEDATION: 1 mg Versed administered for anxiolysis. CONTRAST:  89mL OMNIPAQUE IOHEXOL 300 MG/ML SOLN, 170mL OMNIPAQUE IOHEXOL 300 MG/ML SOLN, 1mL OMNIPAQUE IOHEXOL 300 MG/ML SOLN, 62mL OMNIPAQUE IOHEXOL 300 MG/ML SOLN FLUOROSCOPY TIME:  Fluoroscopy Time: 25 minutes 12 seconds (1901 mGy). COMPLICATIONS: None immediate. PROCEDURE: Informed consent was obtained from the patient following explanation of the procedure, risks, benefits and alternatives. The patient understands, agrees and consents for the procedure. All questions were addressed. A time out was performed prior to the initiation of the procedure. Maximal barrier sterile technique utilized including caps, mask, sterile gowns, sterile gloves, large sterile drape, hand hygiene, and Betadine prep. The right common femoral artery was interrogated with ultrasound and found to be widely patent. An image was obtained and stored for the medical record. Local anesthesia was attained by infiltration with 1%  lidocaine. A small dermatotomy was made. Under real-time sonographic guidance, the vessel was punctured with a 21 gauge micropuncture needle. Using standard technique, the initial micro needle was exchanged over a 0.018 micro wire for a transitional 4 [place] micro sheath. The micro sheath was then exchanged over a 0.035 wire for a 5 French vascular sheath. Initially, a RIM catheter was introduced into the abdominal aorta over a Bentson wire. The aorta is tortuous and variant in caliber with portions that are ectatic, and portions that are small. Heavily calcified atherosclerotic plaque noted throughout. The origin of the inferior mesenteric artery could not be engaged with this catheter. Therefore, the catheter was exchanged for a Mickelson catheter. The Mickelson catheter was formed. Again, the origin of the IMA could not be successfully engaged. The Mickelson catheter was then exchanged for a Sos Omni selective catheter. Despite multiple attempts, the origin of the IMA could not be successfully identified through the heavily calcified plaque. At this point, the decision was made to pursue angiography of the superior mesenteric artery to see if there is a communication into the IMA territory via the middle colic artery. A C2 cobra catheter was advanced over the Bentson wire and used to select the superior mesenteric artery. An angiogram was performed. No obvious communication with the IMA. No evidence of active bleeding. A renegade STC microcatheter was advanced over a Fathom 16 wire and used to select a proximal branch arising from the IMA. Arteriography was performed. This is a jejunal branch with normal opacification of the jejunal mucosa. No evidence of active bleeding. No communication with the IMA territory. The microcatheter was  used to select another branch artery. Contrast injection demonstrates filling of the redundant transverse colon. This appears to be the left middle colic artery colic artery. No  evidence of active bleeding. The microcatheter was advanced further into the unnamed branch supplying the distal transverse colon. Super selective arteriography was performed. Again, no evidence of active bleeding. No communication with the main IMA territory. At this time, the decision was made to go back to attempting to catheterize the inferior mesenteric artery as this would provide the arterial supply to the region of active bleeding seen on the recent CT arteriogram. The Sos microcatheter was reintroduced. Additional arteriography was performed in multiple obliquities in till the origin of the IMA was successfully identified. Due to stenosis at the origin, the catheter tip could not be engaged in the artery. However, the catheter was positioned just outside the arterial ostium and ultimately, the renegade STC microcatheter and Fathom wire were successfully floated into the inferior mesenteric artery. Arteriography was then performed. No evidence of active bleeding on initial angiography. Due to the heavily stenotic origin, opacification of the arteries is somewhat limited. Therefore, the microcatheter was advanced more distally into a sigmoid branch of the IMA. Contrast injection was performed and demonstrates no evidence of active bleeding. The microcatheter was next advanced into the left colic artery. Contrast injection was performed. Again, no evidence of active bleeding. The patient's diverticular bleed appears to have spontaneously stopped. Catheters were removed. Hemostasis was attained with the aid of a Celt closure device. IMPRESSION: 1. Heavily diseased and calcified arteries. Catheterization of the inferior mesenteric artery was extremely challenging but ultimately successful. 2. No evidence of active bleeding at the time of arteriography. Signed, Sterling Big, MD, [facility] Radiology Electronically Signed   By: Malachy Moan M.D.    On: 02/18/2020 08:29   IR Angiogram Selective Each Additional Vessel  Result Date: 02/18/2020 INDICATION: 87 year old female with acute sigmoid diverticular bleed. She presents for arteriogram and possible embolization. EXAM: SELECTIVE VISCERAL ARTERIOGRAPHY; IR ULTRASOUND GUIDANCE VASC ACCESS RIGHT; ADDITIONAL ARTERIOGRAPHY MEDICATIONS: None. ANESTHESIA/SEDATION: 1 mg Versed administered for anxiolysis. CONTRAST:  77mL OMNIPAQUE IOHEXOL 300 MG/ML SOLN, OMNIPAQUE IOHEXOL 300 MG/ML SOLN, 38mL OMNIPAQUE IOHEXOL 300 MG/ML SOLN, 84mL OMNIPAQUE IOHEXOL 300 MG/ML SOLN FLUOROSCOPY TIME:  Fluoroscopy Time: 25 minutes 12 seconds (1901 mGy). COMPLICATIONS: None immediate. PROCEDURE: Informed consent was obtained from the patient following explanation of the procedure, risks, benefits and alternatives. The patient understands, agrees and consents for the procedure. All questions were addressed. A time out was performed prior to the initiation of the procedure. Maximal barrier sterile technique utilized including caps, mask, sterile gowns, sterile gloves, large sterile drape, hand hygiene, and Betadine prep. The right common femoral artery was interrogated with ultrasound and found to be widely patent. An image was obtained and stored for the medical record. Local anesthesia was attained by infiltration with 1% lidocaine. A small dermatotomy was made. Under real-time sonographic guidance, the vessel was punctured with a 21 gauge micropuncture needle. Using standard technique, the initial micro needle was exchanged over a 0.018 micro wire for a transitional 4 [place] micro sheath. The micro sheath was then exchanged over a 0.035 wire for a 5 French vascular sheath. Initially, a RIM catheter was introduced into the abdominal aorta over a Bentson wire. The aorta is tortuous and variant in caliber with portions that are ectatic, and portions that are small. Heavily calcified atherosclerotic plaque noted throughout. The  origin of the  inferior mesenteric artery could not be engaged with this catheter. Therefore, the catheter was exchanged for a Mickelson catheter. The Mickelson catheter was formed. Again, the origin of the IMA could not be successfully engaged. The Mickelson catheter was then exchanged for a Sos Omni selective catheter. Despite multiple attempts, the origin of the IMA could not be successfully identified through the heavily calcified plaque. At this point, the decision was made to pursue angiography of the superior mesenteric artery to see if there is a communication into the IMA territory via the middle colic artery. A C2 cobra catheter was advanced over the Bentson wire and used to select the superior mesenteric artery. An angiogram was performed. No obvious communication with the IMA. No evidence of active bleeding. A renegade STC microcatheter was advanced over a Fathom 16 wire and used to select a proximal branch arising from the IMA. Arteriography was performed. This is a jejunal branch with normal opacification of the jejunal mucosa. No evidence of active bleeding. No communication with the IMA territory. The microcatheter was used to select another branch artery. Contrast injection demonstrates filling of the redundant transverse colon. This appears to be the left middle colic artery colic artery. No evidence of active bleeding. The microcatheter was advanced further into the unnamed branch supplying the distal transverse colon. Super selective arteriography was performed. Again, no evidence of active bleeding. No communication with the main IMA territory. At this time, the decision was made to go back to attempting to catheterize the inferior mesenteric artery as this would provide the arterial supply to the region of active bleeding seen on the recent CT arteriogram. The Sos microcatheter was reintroduced. Additional arteriography was performed in multiple obliquities in till the origin of the IMA was  successfully identified. Due to stenosis at the origin, the catheter tip could not be engaged in the artery. However, the catheter was positioned just outside the arterial ostium and ultimately, the renegade STC microcatheter and Fathom wire were successfully floated into the inferior mesenteric artery. Arteriography was then performed. No evidence of active bleeding on initial angiography. Due to the heavily stenotic origin, opacification of the arteries is somewhat limited. Therefore, the microcatheter was advanced more distally into a sigmoid branch of the IMA. Contrast injection was performed and demonstrates no evidence of active bleeding. The microcatheter was next advanced into the left colic artery. Contrast injection was performed. Again, no evidence of active bleeding. The patient's diverticular bleed appears to have spontaneously stopped. Catheters were removed. Hemostasis was attained with the aid of a Celt closure device. IMPRESSION: 1. Heavily diseased and calcified arteries. Catheterization of the inferior mesenteric artery was extremely challenging but ultimately successful. 2. No evidence of active bleeding at the time of arteriography. Signed, Criselda Peaches, MD, [place] [facility] Radiology Electronically Signed   By: Jacqulynn Cadet M.D.   On: 02/18/2020 08:29   IR Angiogram Selective Each Additional Vessel  Result Date: 02/18/2020 INDICATION: 87 year old female with acute sigmoid diverticular bleed. She presents for arteriogram and possible embolization. EXAM: SELECTIVE VISCERAL ARTERIOGRAPHY; IR ULTRASOUND GUIDANCE VASC ACCESS RIGHT; ADDITIONAL ARTERIOGRAPHY MEDICATIONS: None. ANESTHESIA/SEDATION: 1 mg Versed administered for anxiolysis. CONTRAST:  72mL OMNIPAQUE IOHEXOL 300 MG/ML SOLN, 129mL OMNIPAQUE IOHEXOL 300 MG/ML SOLN, 5mL OMNIPAQUE IOHEXOL 300 MG/ML SOLN, 30mL OMNIPAQUE IOHEXOL 300 MG/ML SOLN FLUOROSCOPY TIME:  Fluoroscopy Time:  25 minutes 12 seconds (1901 mGy). COMPLICATIONS: None immediate. PROCEDURE: Informed consent was obtained from the patient following explanation of the procedure, risks, benefits  and alternatives. The patient understands, agrees and consents for the procedure. All questions were addressed. A time out was performed prior to the initiation of the procedure. Maximal barrier sterile technique utilized including caps, mask, sterile gowns, sterile gloves, large sterile drape, hand hygiene, and Betadine prep. The right common femoral artery was interrogated with ultrasound and found to be widely patent. An image was obtained and stored for the medical record. Local anesthesia was attained by infiltration with 1% lidocaine. A small dermatotomy was made. Under real-time sonographic guidance, the vessel was punctured with a 21 gauge micropuncture needle. Using standard technique, the initial micro needle was exchanged over a 0.018 micro wire for a transitional 4 [place] micro sheath. The micro sheath was then exchanged over a 0.035 wire for a 5 French vascular sheath. Initially, a RIM catheter was introduced into the abdominal aorta over a Bentson wire. The aorta is tortuous and variant in caliber with portions that are ectatic, and portions that are small. Heavily calcified atherosclerotic plaque noted throughout. The origin of the inferior mesenteric artery could not be engaged with this catheter. Therefore, the catheter was exchanged for a Mickelson catheter. The Mickelson catheter was formed. Again, the origin of the IMA could not be successfully engaged. The Mickelson catheter was then exchanged for a Sos Omni selective catheter. Despite multiple attempts, the origin of the IMA could not be successfully identified through the heavily calcified plaque. At this point, the decision was made to pursue angiography of the superior mesenteric artery to see if there is a communication into the IMA territory via the middle colic  artery. A C2 cobra catheter was advanced over the Bentson wire and used to select the superior mesenteric artery. An angiogram was performed. No obvious communication with the IMA. No evidence of active bleeding. A renegade STC microcatheter was advanced over a Fathom 16 wire and used to select a proximal branch arising from the IMA. Arteriography was performed. This is a jejunal branch with normal opacification of the jejunal mucosa. No evidence of active bleeding. No communication with the IMA territory. The microcatheter was used to select another branch artery. Contrast injection demonstrates filling of the redundant transverse colon. This appears to be the left middle colic artery colic artery. No evidence of active bleeding. The microcatheter was advanced further into the unnamed branch supplying the distal transverse colon. Super selective arteriography was performed. Again, no evidence of active bleeding. No communication with the main IMA territory. At this time, the decision was made to go back to attempting to catheterize the inferior mesenteric artery as this would provide the arterial supply to the region of active bleeding seen on the recent CT arteriogram. The Sos microcatheter was reintroduced. Additional arteriography was performed in multiple obliquities in till the origin of the IMA was successfully identified. Due to stenosis at the origin, the catheter tip could not be engaged in the artery. However, the catheter was positioned just outside the arterial ostium and ultimately, the renegade STC microcatheter and Fathom wire were successfully floated into the inferior mesenteric artery. Arteriography was then performed. No evidence of active bleeding on initial angiography. Due to the heavily stenotic origin, opacification of the arteries is somewhat limited. Therefore, the microcatheter was advanced more distally into a sigmoid branch of the IMA. Contrast injection was performed and demonstrates  no evidence of active bleeding. The microcatheter was next advanced into the left colic artery. Contrast injection was performed. Again, no evidence of active bleeding. The patient's diverticular bleed  appears to have spontaneously stopped. Catheters were removed. Hemostasis was attained with the aid of a Celt closure device. IMPRESSION: 1. Heavily diseased and calcified arteries. Catheterization of the inferior mesenteric artery was extremely challenging but ultimately successful. 2. No evidence of active bleeding at the time of arteriography. Signed, Criselda Peaches, MD, [place] [facility] Radiology Electronically Signed   By: Jacqulynn Cadet M.D.   On: 02/18/2020 08:29   IR Angiogram Selective Each Additional Vessel  Result Date: 02/18/2020 INDICATION: 87 year old female with acute sigmoid diverticular bleed. She presents for arteriogram and possible embolization. EXAM: SELECTIVE VISCERAL ARTERIOGRAPHY; IR ULTRASOUND GUIDANCE VASC ACCESS RIGHT; ADDITIONAL ARTERIOGRAPHY MEDICATIONS: None. ANESTHESIA/SEDATION: 1 mg Versed administered for anxiolysis. CONTRAST:  66mL OMNIPAQUE IOHEXOL 300 MG/ML SOLN, 173mL OMNIPAQUE IOHEXOL 300 MG/ML SOLN, 43mL OMNIPAQUE IOHEXOL 300 MG/ML SOLN, 77mL OMNIPAQUE IOHEXOL 300 MG/ML SOLN FLUOROSCOPY TIME:  Fluoroscopy Time: 25 minutes 12 seconds (1901 mGy). COMPLICATIONS: None immediate. PROCEDURE: Informed consent was obtained from the patient following explanation of the procedure, risks, benefits and alternatives. The patient understands, agrees and consents for the procedure. All questions were addressed. A time out was performed prior to the initiation of the procedure. Maximal barrier sterile technique utilized including caps, mask, sterile gowns, sterile gloves, large sterile drape, hand hygiene, and Betadine prep. The right common femoral artery was interrogated with ultrasound and found to be widely patent. An image was  obtained and stored for the medical record. Local anesthesia was attained by infiltration with 1% lidocaine. A small dermatotomy was made. Under real-time sonographic guidance, the vessel was punctured with a 21 gauge micropuncture needle. Using standard technique, the initial micro needle was exchanged over a 0.018 micro wire for a transitional 4 [place] micro sheath. The micro sheath was then exchanged over a 0.035 wire for a 5 French vascular sheath. Initially, a RIM catheter was introduced into the abdominal aorta over a Bentson wire. The aorta is tortuous and variant in caliber with portions that are ectatic, and portions that are small. Heavily calcified atherosclerotic plaque noted throughout. The origin of the inferior mesenteric artery could not be engaged with this catheter. Therefore, the catheter was exchanged for a Mickelson catheter. The Mickelson catheter was formed. Again, the origin of the IMA could not be successfully engaged. The Mickelson catheter was then exchanged for a Sos Omni selective catheter. Despite multiple attempts, the origin of the IMA could not be successfully identified through the heavily calcified plaque. At this point, the decision was made to pursue angiography of the superior mesenteric artery to see if there is a communication into the IMA territory via the middle colic artery. A C2 cobra catheter was advanced over the Bentson wire and used to select the superior mesenteric artery. An angiogram was performed. No obvious communication with the IMA. No evidence of active bleeding. A renegade STC microcatheter was advanced over a Fathom 16 wire and used to select a proximal branch arising from the IMA. Arteriography was performed. This is a jejunal branch with normal opacification of the jejunal mucosa. No evidence of active bleeding. No communication with the IMA territory. The microcatheter was used to select another branch artery. Contrast injection demonstrates filling of the  redundant transverse colon. This appears to be the left middle colic artery colic artery. No evidence of active bleeding. The microcatheter was advanced further into the unnamed branch supplying the distal transverse colon. Super selective arteriography was performed. Again, no evidence of active bleeding. No communication with the  main IMA territory. At this time, the decision was made to go back to attempting to catheterize the inferior mesenteric artery as this would provide the arterial supply to the region of active bleeding seen on the recent CT arteriogram. The Sos microcatheter was reintroduced. Additional arteriography was performed in multiple obliquities in till the origin of the IMA was successfully identified. Due to stenosis at the origin, the catheter tip could not be engaged in the artery. However, the catheter was positioned just outside the arterial ostium and ultimately, the renegade STC microcatheter and Fathom wire were successfully floated into the inferior mesenteric artery. Arteriography was then performed. No evidence of active bleeding on initial angiography. Due to the heavily stenotic origin, opacification of the arteries is somewhat limited. Therefore, the microcatheter was advanced more distally into a sigmoid branch of the IMA. Contrast injection was performed and demonstrates no evidence of active bleeding. The microcatheter was next advanced into the left colic artery. Contrast injection was performed. Again, no evidence of active bleeding. The patient's diverticular bleed appears to have spontaneously stopped. Catheters were removed. Hemostasis was attained with the aid of a Celt closure device. IMPRESSION: 1. Heavily diseased and calcified arteries. Catheterization of the inferior mesenteric artery was extremely challenging but ultimately successful. 2. No evidence of active bleeding at the time of arteriography. Signed, Criselda Peaches, MD, [place] [facility] Radiology Electronically Signed   By: Jacqulynn Cadet M.D.   On: 02/18/2020 08:29   IR Angiogram Selective Each Additional Vessel  Result Date: 02/18/2020 INDICATION: 87 year old female with acute sigmoid diverticular bleed. She presents for arteriogram and possible embolization. EXAM: SELECTIVE VISCERAL ARTERIOGRAPHY; IR ULTRASOUND GUIDANCE VASC ACCESS RIGHT; ADDITIONAL ARTERIOGRAPHY MEDICATIONS: None. ANESTHESIA/SEDATION: 1 mg Versed administered for anxiolysis. CONTRAST:  67mL OMNIPAQUE IOHEXOL 300 MG/ML SOLN, 139mL OMNIPAQUE IOHEXOL 300 MG/ML SOLN, 34mL OMNIPAQUE IOHEXOL 300 MG/ML SOLN, 9mL OMNIPAQUE IOHEXOL 300 MG/ML SOLN FLUOROSCOPY TIME:  Fluoroscopy Time: 25 minutes 12 seconds (1901 mGy). COMPLICATIONS: None immediate. PROCEDURE: Informed consent was obtained from the patient following explanation of the procedure, risks, benefits and alternatives. The patient understands, agrees and consents for the procedure. All questions were addressed. A time out was performed prior to the initiation of the procedure. Maximal barrier sterile technique utilized including caps, mask, sterile gowns, sterile gloves, large sterile drape, hand hygiene, and Betadine prep. The right common femoral artery was interrogated with ultrasound and found to be widely patent. An image was obtained and stored for the medical record. Local anesthesia was attained by infiltration with 1% lidocaine. A small dermatotomy was made. Under real-time sonographic guidance, the vessel was punctured with a 21 gauge micropuncture needle. Using standard technique, the initial micro needle was exchanged over a 0.018 micro wire for a transitional 4 [place] micro sheath. The micro sheath was then exchanged over a 0.035 wire for a 5 French vascular sheath. Initially, a RIM catheter was introduced into the abdominal aorta over a Bentson wire. The aorta is tortuous and variant in caliber with portions that are ectatic,  and portions that are small. Heavily calcified atherosclerotic plaque noted throughout. The origin of the inferior mesenteric artery could not be engaged with this catheter. Therefore, the catheter was exchanged for a Mickelson catheter. The Mickelson catheter was formed. Again, the origin of the IMA could not be successfully engaged. The Mickelson catheter was then exchanged for a Sos Omni selective catheter. Despite multiple attempts, the origin of the IMA could not be successfully identified through  the heavily calcified plaque. At this point, the decision was made to pursue angiography of the superior mesenteric artery to see if there is a communication into the IMA territory via the middle colic artery. A C2 cobra catheter was advanced over the Bentson wire and used to select the superior mesenteric artery. An angiogram was performed. No obvious communication with the IMA. No evidence of active bleeding. A renegade STC microcatheter was advanced over a Fathom 16 wire and used to select a proximal branch arising from the IMA. Arteriography was performed. This is a jejunal branch with normal opacification of the jejunal mucosa. No evidence of active bleeding. No communication with the IMA territory. The microcatheter was used to select another branch artery. Contrast injection demonstrates filling of the redundant transverse colon. This appears to be the left middle colic artery colic artery. No evidence of active bleeding. The microcatheter was advanced further into the unnamed branch supplying the distal transverse colon. Super selective arteriography was performed. Again, no evidence of active bleeding. No communication with the main IMA territory. At this time, the decision was made to go back to attempting to catheterize the inferior mesenteric artery as this would provide the arterial supply to the region of active bleeding seen on the recent CT arteriogram. The Sos microcatheter was reintroduced.  Additional arteriography was performed in multiple obliquities in till the origin of the IMA was successfully identified. Due to stenosis at the origin, the catheter tip could not be engaged in the artery. However, the catheter was positioned just outside the arterial ostium and ultimately, the renegade STC microcatheter and Fathom wire were successfully floated into the inferior mesenteric artery. Arteriography was then performed. No evidence of active bleeding on initial angiography. Due to the heavily stenotic origin, opacification of the arteries is somewhat limited. Therefore, the microcatheter was advanced more distally into a sigmoid branch of the IMA. Contrast injection was performed and demonstrates no evidence of active bleeding. The microcatheter was next advanced into the left colic artery. Contrast injection was performed. Again, no evidence of active bleeding. The patient's diverticular bleed appears to have spontaneously stopped. Catheters were removed. Hemostasis was attained with the aid of a Celt closure device. IMPRESSION: 1. Heavily diseased and calcified arteries. Catheterization of the inferior mesenteric artery was extremely challenging but ultimately successful. 2. No evidence of active bleeding at the time of arteriography. Signed, Criselda Peaches, MD, [place] [facility] Radiology Electronically Signed   By: Jacqulynn Cadet M.D.   On: 02/18/2020 08:29   IR US Guide Vasc Access Right  Result Date: 02/18/2020 INDICATION: 87 year old female with acute sigmoid diverticular bleed. She presents for arteriogram and possible embolization. EXAM: SELECTIVE VISCERAL ARTERIOGRAPHY; IR ULTRASOUND GUIDANCE VASC ACCESS RIGHT; ADDITIONAL ARTERIOGRAPHY MEDICATIONS: None. ANESTHESIA/SEDATION: 1 mg Versed administered for anxiolysis. CONTRAST:  81mL OMNIPAQUE IOHEXOL 300 MG/ML SOLN, 129mL OMNIPAQUE IOHEXOL 300 MG/ML SOLN, 32mL OMNIPAQUE IOHEXOL 300 MG/ML  SOLN, 13mL OMNIPAQUE IOHEXOL 300 MG/ML SOLN FLUOROSCOPY TIME:  Fluoroscopy Time: 25 minutes 12 seconds (1901 mGy). COMPLICATIONS: None immediate. PROCEDURE: Informed consent was obtained from the patient following explanation of the procedure, risks, benefits and alternatives. The patient understands, agrees and consents for the procedure. All questions were addressed. A time out was performed prior to the initiation of the procedure. Maximal barrier sterile technique utilized including caps, mask, sterile gowns, sterile gloves, large sterile drape, hand hygiene, and Betadine prep. The right common femoral artery was interrogated with ultrasound and found to be widely  patent. An image was obtained and stored for the medical record. Local anesthesia was attained by infiltration with 1% lidocaine. A small dermatotomy was made. Under real-time sonographic guidance, the vessel was punctured with a 21 gauge micropuncture needle. Using standard technique, the initial micro needle was exchanged over a 0.018 micro wire for a transitional 4 [place] micro sheath. The micro sheath was then exchanged over a 0.035 wire for a 5 French vascular sheath. Initially, a RIM catheter was introduced into the abdominal aorta over a Bentson wire. The aorta is tortuous and variant in caliber with portions that are ectatic, and portions that are small. Heavily calcified atherosclerotic plaque noted throughout. The origin of the inferior mesenteric artery could not be engaged with this catheter. Therefore, the catheter was exchanged for a Mickelson catheter. The Mickelson catheter was formed. Again, the origin of the IMA could not be successfully engaged. The Mickelson catheter was then exchanged for a Sos Omni selective catheter. Despite multiple attempts, the origin of the IMA could not be successfully identified through the heavily calcified plaque. At this point, the decision was made to pursue angiography of the superior mesenteric artery  to see if there is a communication into the IMA territory via the middle colic artery. A C2 cobra catheter was advanced over the Bentson wire and used to select the superior mesenteric artery. An angiogram was performed. No obvious communication with the IMA. No evidence of active bleeding. A renegade STC microcatheter was advanced over a Fathom 16 wire and used to select a proximal branch arising from the IMA. Arteriography was performed. This is a jejunal branch with normal opacification of the jejunal mucosa. No evidence of active bleeding. No communication with the IMA territory. The microcatheter was used to select another branch artery. Contrast injection demonstrates filling of the redundant transverse colon. This appears to be the left middle colic artery colic artery. No evidence of active bleeding. The microcatheter was advanced further into the unnamed branch supplying the distal transverse colon. Super selective arteriography was performed. Again, no evidence of active bleeding. No communication with the main IMA territory. At this time, the decision was made to go back to attempting to catheterize the inferior mesenteric artery as this would provide the arterial supply to the region of active bleeding seen on the recent CT arteriogram. The Sos microcatheter was reintroduced. Additional arteriography was performed in multiple obliquities in till the origin of the IMA was successfully identified. Due to stenosis at the origin, the catheter tip could not be engaged in the artery. However, the catheter was positioned just outside the arterial ostium and ultimately, the renegade STC microcatheter and Fathom wire were successfully floated into the inferior mesenteric artery. Arteriography was then performed. No evidence of active bleeding on initial angiography. Due to the heavily stenotic origin, opacification of the arteries is somewhat limited. Therefore, the microcatheter was advanced more distally into a  sigmoid branch of the IMA. Contrast injection was performed and demonstrates no evidence of active bleeding. The microcatheter was next advanced into the left colic artery. Contrast injection was performed. Again, no evidence of active bleeding. The patient's diverticular bleed appears to have spontaneously stopped. Catheters were removed. Hemostasis was attained with the aid of a Celt closure device. IMPRESSION: 1. Heavily diseased and calcified arteries. Catheterization of the inferior mesenteric artery was extremely challenging but ultimately successful. 2. No evidence of active bleeding at the time of arteriography. Signed, Criselda Peaches, MD, [place] [facility]  Radiology Electronically Signed   By: Jacqulynn Cadet M.D.   On: 02/18/2020 08:29   CT Angio Abd/Pel w/ and/or w/o  Addendum Date: 02/17/2020   ADDENDUM REPORT: 02/17/2020 18:33 ADDENDUM: Critical Value/emergent results were called by telephone at the time of interpretation on 02/17/2020 at 6:32 pm to provider Dr. Louanne Belton, Who verbally acknowledged these results. Electronically Signed   By: Marijo Conception M.D.   On: 02/17/2020 18:33   Result Date: 02/17/2020 CLINICAL DATA:  Rectal bleeding.  Anemia. EXAM: CTA ABDOMEN AND PELVIS WITHOUT AND WITH CONTRAST TECHNIQUE: Multidetector CT imaging of the abdomen and pelvis was performed using the standard protocol during bolus administration of intravenous contrast. Multiplanar reconstructed images and MIPs were obtained and reviewed to evaluate the vascular anatomy. CONTRAST:  140mL OMNIPAQUE IOHEXOL 350 MG/ML SOLN COMPARISON:  None. FINDINGS: VASCULAR Aorta: Atherosclerosis of thoracic aorta is noted without aneurysm or dissection. Celiac: Patent without evidence of aneurysm, dissection, vasculitis or significant stenosis. SMA: Patent without evidence of aneurysm, dissection, vasculitis or significant stenosis. Renals: 2 right renal arteries are noted  which are widely patent without significant stenosis. However, heavily calcified plaque is seen involving the proximal portion of the left renal artery resulting in moderate to severe focal stenosis. IMA: Patent without evidence of aneurysm, dissection, vasculitis or significant stenosis. Inflow: Common iliac arteries bilaterally are widely patent. However, multiple moderate stenoses are noted in the right external iliac artery secondary to calcified plaque. Multiple moderate stenoses are also noted in the left external iliac artery secondary to calcified plaque. Proximal Outflow: Moderate to severe narrowing is seen involving the visualized portions of the common and superficial femoral arteries bilaterally secondary to calcified plaque. Veins: No obvious venous abnormality within the limitations of this arterial phase study. Review of the MIP images confirms the above findings. NON-VASCULAR Lower chest: Small left pleural effusion is noted with adjacent subsegmental atelectasis. Hepatobiliary: No gallstones or biliary dilatation is noted. Left hepatic cyst is noted. Pancreas: Unremarkable. No pancreatic ductal dilatation or surrounding inflammatory changes. Spleen: Normal in size without focal abnormality. Adrenals/Urinary Tract: Adrenal glands are unremarkable. Kidneys are normal, without renal calculi, focal lesion, or hydronephrosis. Bladder is unremarkable. Stomach/Bowel: Stomach is within normal limits. Appendix appears normal. There is no evidence of bowel obstruction. However, there is seen a small focus of extravasation of contrast in a portion of the proximal sigmoid colon best seen on image number 101 of series 7. This is consistent with active gastrointestinal hemorrhage. Lymphatic: No adenopathy is noted. Reproductive: Uterus and bilateral adnexa are unremarkable. Other: No abdominal wall hernia or abnormality. No abdominopelvic ascites. Musculoskeletal: No acute or significant osseous findings.  IMPRESSION: Small focus of extravasation of contrast is seen in a portion of the proximal sigmoid colon consistent with active gastrointestinal hemorrhage. These results will be called to the ordering clinician or representative by the Radiologist Assistant, and communication documented in the PACS or zVision Dashboard. Severe stenosis is seen involving proximal portion of left renal artery secondary to calcified plaque. Multiple moderate to severe stenoses are noted in both external iliac arteries as well as the proximal femoral artery secondary to calcified plaque. Small left pleural effusion is noted with adjacent subsegmental atelectasis. Aortic Atherosclerosis (ICD10-I70.0). Electronically Signed: By: Marijo Conception M.D. On: 02/17/2020 18:24       Assessment / Plan:   Assessment: 1.  Diverticular bleed: Came in with active bleed on 02/16/2020, EGD unrevealing, CTA positive with successful catheterization by IR of inferior mesenteric artery but no  active bleeding at the time, hemoglobin 10.9--> 8.1--> 7.7--> large bloody bowel movement--> 1 unit PRBC--> 7.7-holding steady overnight, no further bleeding this morning per nursing 2.  Acute blood loss anemia secondary to above 3.  Fibromyalgia 4.  COPD  Plan: 1.  Continue to monitor hemoglobin with transfusion as needed less than 7 2.  Agree with regular diet today, we can see how she does. 3.  Please await any further recommendations from Dr. Fuller Plan later today  Thank you for your kind consultation, we will continue to follow.   LOS: 2 days   Levin Erp  02/18/2020, 10:01 AM      Attending Physician Note   I have taken an interval history, reviewed the chart and examined the patient. I agree with the Advanced Practitioner's note, impression and recommendations.   * Resolving diverticular bleed. Mesenteric angiogram with IMA catheterization was extremely challenging and bleeding was not noted during the angiogram. Observe for  re-bleeding.    * ALB anemia. Continue to trend CBC. Transfuse for Hgb < 7.   Lucio Edward, MD [facility]

## 2020-02-19 ENCOUNTER — Other Ambulatory Visit: Payer: Self-pay

## 2020-02-19 DIAGNOSIS — J449 Chronic obstructive pulmonary disease, unspecified: Secondary | ICD-10-CM | POA: Diagnosis not present

## 2020-02-19 DIAGNOSIS — I10 Essential (primary) hypertension: Secondary | ICD-10-CM | POA: Diagnosis not present

## 2020-02-19 DIAGNOSIS — K5731 Diverticulosis of large intestine without perforation or abscess with bleeding: Principal | ICD-10-CM

## 2020-02-19 DIAGNOSIS — K921 Melena: Secondary | ICD-10-CM | POA: Diagnosis not present

## 2020-02-19 DIAGNOSIS — K254 Chronic or unspecified gastric ulcer with hemorrhage: Secondary | ICD-10-CM | POA: Diagnosis not present

## 2020-02-19 DIAGNOSIS — D62 Acute posthemorrhagic anemia: Secondary | ICD-10-CM | POA: Diagnosis not present

## 2020-02-19 LAB — BASIC METABOLIC PANEL
Anion gap: 8 (ref 5–15)
BUN: 19 mg/dL (ref 8–23)
CO2: 23 mmol/L (ref 22–32)
Calcium: 8.5 mg/dL — ABNORMAL LOW (ref 8.9–10.3)
Chloride: 108 mmol/L (ref 98–111)
Creatinine, Ser: 0.65 mg/dL (ref 0.44–1.00)
GFR, Estimated: >60 mL/min (ref >60)
Glucose, Bld: 125 mg/dL — ABNORMAL HIGH (ref 70–99)
Potassium: 3.4 mmol/L — ABNORMAL LOW (ref 3.5–5.1)
Sodium: 139 mmol/L (ref 135–145)

## 2020-02-19 LAB — CBC
HCT: 20.7 % — ABNORMAL LOW (ref 36.0–46.0)
Hemoglobin: 6.8 g/dL — CL (ref 12.0–15.0)
MCH: 30.5 pg (ref 26.0–34.0)
MCHC: 32.9 g/dL (ref 30.0–36.0)
MCV: 92.8 fL (ref 80.0–100.0)
Platelets: 156 10*3/uL (ref 150–400)
RBC: 2.23 MIL/uL — ABNORMAL LOW (ref 3.87–5.11)
RDW: 15.7 % — ABNORMAL HIGH (ref 11.5–15.5)
WBC: 7.5 10*3/uL (ref 4.0–10.5)
nRBC: 0 % (ref 0.0–0.2)

## 2020-02-19 LAB — PREPARE RBC (CROSSMATCH)

## 2020-02-19 MED ORDER — ESTRADIOL 0.1 MG/GM VA CREA
1.0000 | TOPICAL_CREAM | VAGINAL | Status: DC
  Filled 2020-02-19 (×2): qty 42.5

## 2020-02-19 MED ORDER — SODIUM CHLORIDE 0.9% IV SOLUTION: Freq: Once | INTRAVENOUS | Status: DC

## 2020-02-19 MED ORDER — METOPROLOL TARTRATE 25 MG PO TABS
12.5000 mg | ORAL_TABLET | Freq: Two times a day (BID) | ORAL | Status: DC
  Administered 2020-02-19 – 2020-02-21 (×4): 12.5 mg via ORAL
  Filled 2020-02-19 (×4): qty 1

## 2020-02-19 MED ORDER — CYCLOBENZAPRINE HCL 5 MG PO TABS
2.5000 mg | ORAL_TABLET | Freq: Every day | ORAL | Status: DC
  Administered 2020-02-19 – 2020-02-20 (×2): 2.5 mg via ORAL
  Filled 2020-02-19 (×2): qty 1

## 2020-02-19 MED ORDER — POLYETHYLENE GLYCOL 3350 17 G PO PACK: 17.0000 g | PACK | ORAL | Status: DC

## 2020-02-19 MED ORDER — PREGABALIN 50 MG PO CAPS
50.0000 mg | ORAL_CAPSULE | Freq: Every day | ORAL | Status: DC
  Administered 2020-02-19 – 2020-02-21 (×3): 50 mg via ORAL
  Filled 2020-02-19 (×3): qty 1

## 2020-02-19 MED ORDER — POTASSIUM CHLORIDE CRYS ER 20 MEQ PO TBCR
40.0000 meq | EXTENDED_RELEASE_TABLET | Freq: Once | ORAL | Status: AC
  Administered 2020-02-19: 40 meq via ORAL
  Filled 2020-02-19: qty 2

## 2020-02-19 MED ORDER — HYDROCORTISONE 1 % EX CREA
1.0000 "application " | TOPICAL_CREAM | Freq: Two times a day (BID) | CUTANEOUS | Status: DC | PRN
  Administered 2020-02-19: 1 via TOPICAL
  Filled 2020-02-19 (×2): qty 28

## 2020-02-19 NOTE — Progress Notes (Addendum)
PROGRESS NOTE  Lisa King RUE:[id number] DOB: [birth year] DOA: 02/16/2020 PCP: Mast, Man X, NP   LOS: 3 days   Brief narrative: As per HPI,  Patient is a 87 year old female with past medical history of COPD, hemorrhoids, hypertension, hyperlipidemia, GERD who presented from ALF for evaluation of rectal bleeding.  Patient had a large amount of maroon-colored stool day prior to presentation with frequent episodes after that.  Patient denied any abdominal pain or fevers.  She was not on anticoagulation but does take 2 tablets of Aleve daily for arthritis. States that she had a GI bleed once before several years ago.   History of colonoscopy in 2015 with moderate diverticulosis.  Patient was then admitted to hospital for evaluation of GI bleed.  Assessment/Plan:  Principal Problem:   GI bleed Active Problems:   COPD (chronic obstructive pulmonary disease) with chronic bronchitis (HCC)   ABLA (acute blood loss anemia)   Hypertension   Systolic murmur   Hematochezia  Acute blood loss anemia secondary to GI bleed. history of diverticulosis and NSAID use.  GI was consulted and patient underwent endoscopic evaluation on 02/17/2020 with findings of possible candidal esophagitis and three nonbleeding gastric ulcers which were biopsied.  Patient received 1 unit of packed RBC initially but hemoglobin has down trended today.  Will receive one additional unit of PRBC today.  GI had an impression of diverticular bleed CTA of the abdomen was performed which showed possible bleeding from the proximal sigmoid colon.  IR was consulted consulted and patient underwent angiogram with multivessel disease no active bleeding was found at the time of angiogram.  Patient was then treated conservatively.  Continue Protonix.  Patient was counseled against Aleve.  Transfer the patient from stepdown unit to telemetry today  CBC Latest Ref Rng & Units 02/19/2020 02/18/2020 02/17/2020  WBC 4.0 - 10.5 K/uL 7.5 9.8 -  Hemoglobin  12.0 - 15.0 g/dL 6.8(LL) 7.7(L) 7.7(L)  Hematocrit 36.0 - 46.0 % 20.7(L) 23.2(L) 24.5(L)  Platelets 150 - 400 K/uL 156 165 -    Essential hypertension Restart Lopressor from today.  Fibromyalgia Continue Lyrica.  COPD Compensated, Symbicort changed to [facility] while inpatient  Mild hypokalemia.  Will replace orally.  Check levels in a.m.  DVT prophylaxis: SCDs Start: 02/16/20 0753  Code Status: DNR  Family Communication:  Spoke with the patient's daughter Zella Ball on the phone and updated her about the clinical condition of the patient. Stated potential disposition to ALF by tomorrow.   Status is: Inpatient  Remains inpatient appropriate because:IV treatments appropriate due to intensity of illness or inability to take PO, Inpatient level of care appropriate due to severity of illness and GI bleed, near need for PRBC transfusion, continued monitoring.   Dispo: The patient is from: [facility]              Anticipated d/c is to: ALF              Anticipated d/c date is: 1 to 2 days if no further bleeding and hemoglobin remained stable. Follow GI recommendations.              Patient currently is not medically stable to d/c.   Consultants:  GI  Interventional radiology  Procedures:  PRBC transfusion 1 units  Selective angiogram of the mesenteric vessels by IR on 02/17/20  EGD on 1-2/22  Antibiotics:  . None  Anti-infectives (From admission, onward)   None       Subjective: Today, patient  was seen and examined at bedside .  Further drop of hemoglobin noted this morning.  Denies further bowel movements.  No nausea vomiting or abdominal pain.  patient was seen and examined at bedside.  Patient denies any abdominal pain nausea vomiting.  Did have a bowel movement yesterday but no further bleeding today.  Patient is willing to try to eat something today  Objective: Vitals:   02/19/20 0345 02/19/20 0400  BP: (!) 120/43 (!) 121/48  Pulse: 86 88   Resp: 18 (!) 21  Temp:  98.4 F (36.9 C)  SpO2: 95% 95%    Intake/Output Summary (Last 24 hours) at 02/19/2020 0709 Last data filed at 02/19/2020 0000 Gross per 24 hour  Intake 740 ml  Output 1650 ml  Net -910 ml   Filed Weights   02/16/20 0829 02/17/20 1226  Weight: 60.3 kg 60.3 kg   Body mass index is 24.31 kg/m.   Physical Exam:  General:  Average built, not in obvious distress HENT:   Mild pallor noted.  Oral mucosa is moist.  Chest:  Clear breath sounds.  Diminished breath sounds bilaterally. No crackles or wheezes.  CVS: S1 &S2 heard.  Systolic murmur.  Regular rate and rhythm. Abdomen: Soft, nontender, nondistended.  Bowel sounds are heard.   Extremities: No cyanosis, clubbing or edema.  Peripheral pulses are palpable. Psych: Alert, awake and oriented, normal mood CNS:  No cranial nerve deficits.  Power equal in all extremities.   Skin: Warm and dry.  No rashes noted.  Data Review: I have personally reviewed the following laboratory data and studies,  CBC: Recent Labs  Lab 02/16/20 0408 02/16/20 0550 02/16/20 1455 02/17/20 0526 02/17/20 1528 02/18/20 0115 02/19/20 0254  WBC 8.1  --   --  8.3  --  9.8 7.5  HGB 9.8*   < > 8.1* 7.6* 7.7* 7.7* 6.8*  HCT 30.1*   < > 25.1* 23.4* 24.5* 23.2* 20.7*  MCV 94.7  --   --  93.6  --  92.4 92.8  PLT 218  --   --  184  --  165 156   < > = values in this interval not displayed.   Basic Metabolic Panel: Recent Labs  Lab 02/16/20 0408 02/17/20 0526 02/18/20 0115 02/19/20 0254  NA 138 142 136 139  K 4.2 3.8 3.7 3.4*  CL 106 111 108 108  CO2 24 24 21* 23  GLUCOSE 140* 104* 122* 125*  BUN 26* 22 16 19   CREATININE 0.96 0.57 0.58 0.65  CALCIUM 8.9 8.5* 7.8* 8.5*   Liver Function Tests: Recent Labs  Lab 02/16/20 0408  AST 18  ALT 15  ALKPHOS 72  BILITOT 0.6  PROT 6.0*  ALBUMIN 3.8   No results for input(s): LIPASE, AMYLASE in the last 168 hours. No results for input(s): AMMONIA in the last 168  hours. Cardiac Enzymes: No results for input(s): CKTOTAL, CKMB, CKMBINDEX, TROPONINI in the last 168 hours. BNP (last 3 results) No results for input(s): BNP in the last 8760 hours.  ProBNP (last 3 results) No results for input(s): PROBNP in the last 8760 hours.  CBG: No results for input(s): GLUCAP in the last 168 hours. Recent Results (from the past 240 hour(s))  Resp Panel by RT-PCR (Flu A&B, Covid) Nasopharyngeal Swab     Status: None   Collection Time: 02/16/20  7:00 AM   Specimen: Nasopharyngeal Swab; Nasopharyngeal(NP) swabs in vial transport medium  Result Value Ref Range Status   SARS Coronavirus  2 by RT PCR NEGATIVE NEGATIVE Final    Comment: (NOTE) SARS-CoV-2 target nucleic acids are NOT DETECTED.  The SARS-CoV-2 RNA is generally detectable in upper respiratory specimens during the acute phase of infection. The lowest concentration of SARS-CoV-2 viral copies this assay can detect is 138 copies/mL. A negative result does not preclude SARS-Cov-2 infection and should not be used as the sole basis for treatment or other patient management decisions. A negative result may occur with  improper specimen collection/handling, submission of specimen other than nasopharyngeal swab, presence of viral mutation(s) within the areas targeted by this assay, and inadequate number of viral copies(<138 copies/mL). A negative result must be combined with clinical observations, patient history, and epidemiological information. The expected result is Negative.  Fact Sheet for Patients:  BloggerCourse.com  Fact Sheet for Healthcare Providers:  SeriousBroker.it  This test is no t yet approved or cleared by the [place] FDA and  has been authorized for detection and/or diagnosis of SARS-CoV-2 by FDA under an Emergency Use Authorization (EUA). This EUA will remain  in effect (meaning this test can be used) for the duration of  the COVID-19 declaration under Section 564(b)(1) of the Act, 21 U.S.C.section 360bbb-3(b)(1), unless the authorization is terminated  or revoked sooner.       Influenza A by PCR NEGATIVE NEGATIVE Final   Influenza B by PCR NEGATIVE NEGATIVE Final    Comment: (NOTE) The Xpert Xpress SARS-CoV-2/FLU/RSV plus assay is intended as an aid in the diagnosis of influenza from Nasopharyngeal swab specimens and should not be used as a sole basis for treatment. Nasal washings and aspirates are unacceptable for Xpert Xpress SARS-CoV-2/FLU/RSV testing.  Fact Sheet for Patients: BloggerCourse.com  Fact Sheet for Healthcare Providers: SeriousBroker.it  This test is not yet approved or cleared by the [place] FDA and has been authorized for detection and/or diagnosis of SARS-CoV-2 by FDA under an Emergency Use Authorization (EUA). This EUA will remain in effect (meaning this test can be used) for the duration of the COVID-19 declaration under Section 564(b)(1) of the Act, 21 U.S.C. section 360bbb-3(b)(1), unless the authorization is terminated or revoked.  Performed at [facility], [address], [place], [place] [address]   MRSA PCR Screening     Status: None   Collection Time: 02/17/20  9:41 PM   Specimen: Nasopharyngeal  Result Value Ref Range Status   MRSA by PCR NEGATIVE NEGATIVE Final    Comment:        The GeneXpert MRSA Assay (FDA approved for NASAL specimens only), is one component of a comprehensive MRSA colonization surveillance program. It is not intended to diagnose MRSA infection nor to guide or monitor treatment for MRSA infections. Performed at [facility], [address], [place], [place] [address]      Studies: IR Angiogram Visceral Selective  Result Date: 02/18/2020 INDICATION: 87 year old female with acute sigmoid diverticular bleed. She presents for arteriogram and  possible embolization. EXAM: SELECTIVE VISCERAL ARTERIOGRAPHY; IR ULTRASOUND GUIDANCE VASC ACCESS RIGHT; ADDITIONAL ARTERIOGRAPHY MEDICATIONS: None. ANESTHESIA/SEDATION: 1 mg Versed administered for anxiolysis. CONTRAST:  30mL OMNIPAQUE IOHEXOL 300 MG/ML SOLN, OMNIPAQUE IOHEXOL 300 MG/ML SOLN, 30mL OMNIPAQUE IOHEXOL 300 MG/ML SOLN, 30mL OMNIPAQUE IOHEXOL 300 MG/ML SOLN FLUOROSCOPY TIME:  Fluoroscopy Time: 25 minutes 12 seconds (1901 mGy). COMPLICATIONS: None immediate. PROCEDURE: Informed consent was obtained from the patient following explanation of the procedure, risks, benefits and alternatives. The patient understands, agrees and consents for the procedure. All questions were addressed. A  time out was performed prior to the initiation of the procedure. Maximal barrier sterile technique utilized including caps, mask, sterile gowns, sterile gloves, large sterile drape, hand hygiene, and Betadine prep. The right common femoral artery was interrogated with ultrasound and found to be widely patent. An image was obtained and stored for the medical record. Local anesthesia was attained by infiltration with 1% lidocaine. A small dermatotomy was made. Under real-time sonographic guidance, the vessel was punctured with a 21 gauge micropuncture needle. Using standard technique, the initial micro needle was exchanged over a 0.018 micro wire for a transitional 4 [place] micro sheath. The micro sheath was then exchanged over a 0.035 wire for a 5 French vascular sheath. Initially, a RIM catheter was introduced into the abdominal aorta over a Bentson wire. The aorta is tortuous and variant in caliber with portions that are ectatic, and portions that are small. Heavily calcified atherosclerotic plaque noted throughout. The origin of the inferior mesenteric artery could not be engaged with this catheter. Therefore, the catheter was exchanged for a Mickelson catheter. The Mickelson catheter was formed. Again, the origin of  the IMA could not be successfully engaged. The Mickelson catheter was then exchanged for a Sos Omni selective catheter. Despite multiple attempts, the origin of the IMA could not be successfully identified through the heavily calcified plaque. At this point, the decision was made to pursue angiography of the superior mesenteric artery to see if there is a communication into the IMA territory via the middle colic artery. A C2 cobra catheter was advanced over the Bentson wire and used to select the superior mesenteric artery. An angiogram was performed. No obvious communication with the IMA. No evidence of active bleeding. A renegade STC microcatheter was advanced over a Fathom 16 wire and used to select a proximal branch arising from the IMA. Arteriography was performed. This is a jejunal branch with normal opacification of the jejunal mucosa. No evidence of active bleeding. No communication with the IMA territory. The microcatheter was used to select another branch artery. Contrast injection demonstrates filling of the redundant transverse colon. This appears to be the left middle colic artery colic artery. No evidence of active bleeding. The microcatheter was advanced further into the unnamed branch supplying the distal transverse colon. Super selective arteriography was performed. Again, no evidence of active bleeding. No communication with the main IMA territory. At this time, the decision was made to go back to attempting to catheterize the inferior mesenteric artery as this would provide the arterial supply to the region of active bleeding seen on the recent CT arteriogram. The Sos microcatheter was reintroduced. Additional arteriography was performed in multiple obliquities in till the origin of the IMA was successfully identified. Due to stenosis at the origin, the catheter tip could not be engaged in the artery. However, the catheter was positioned just outside the arterial ostium and ultimately, the  renegade STC microcatheter and Fathom wire were successfully floated into the inferior mesenteric artery. Arteriography was then performed. No evidence of active bleeding on initial angiography. Due to the heavily stenotic origin, opacification of the arteries is somewhat limited. Therefore, the microcatheter was advanced more distally into a sigmoid branch of the IMA. Contrast injection was performed and demonstrates no evidence of active bleeding. The microcatheter was next advanced into the left colic artery. Contrast injection was performed. Again, no evidence of active bleeding. The patient's diverticular bleed appears to have spontaneously stopped. Catheters were removed. Hemostasis was attained with the aid of a  Celt closure device. IMPRESSION: 1. Heavily diseased and calcified arteries. Catheterization of the inferior mesenteric artery was extremely challenging but ultimately successful. 2. No evidence of active bleeding at the time of arteriography. Signed, Sterling Big, MD, RPVI [facility] Radiology Electronically Signed   By: Malachy Moan M.D.   On: 02/18/2020 08:29   IR Angiogram Visceral Selective  Result Date: 02/18/2020 INDICATION: 87 year old female with acute sigmoid diverticular bleed. She presents for arteriogram and possible embolization. EXAM: SELECTIVE VISCERAL ARTERIOGRAPHY; IR ULTRASOUND GUIDANCE VASC ACCESS RIGHT; ADDITIONAL ARTERIOGRAPHY MEDICATIONS: None. ANESTHESIA/SEDATION: 1 mg Versed administered for anxiolysis. CONTRAST:  30mL OMNIPAQUE IOHEXOL 300 MG/ML SOLN, OMNIPAQUE IOHEXOL 300 MG/ML SOLN, 30mL OMNIPAQUE IOHEXOL 300 MG/ML SOLN, 30mL OMNIPAQUE IOHEXOL 300 MG/ML SOLN FLUOROSCOPY TIME:  Fluoroscopy Time: 25 minutes 12 seconds (1901 mGy). COMPLICATIONS: None immediate. PROCEDURE: Informed consent was obtained from the patient following explanation of the procedure, risks, benefits and alternatives. The patient  understands, agrees and consents for the procedure. All questions were addressed. A time out was performed prior to the initiation of the procedure. Maximal barrier sterile technique utilized including caps, mask, sterile gowns, sterile gloves, large sterile drape, hand hygiene, and Betadine prep. The right common femoral artery was interrogated with ultrasound and found to be widely patent. An image was obtained and stored for the medical record. Local anesthesia was attained by infiltration with 1% lidocaine. A small dermatotomy was made. Under real-time sonographic guidance, the vessel was punctured with a 21 gauge micropuncture needle. Using standard technique, the initial micro needle was exchanged over a 0.018 micro wire for a transitional 4 [place] micro sheath. The micro sheath was then exchanged over a 0.035 wire for a 5 French vascular sheath. Initially, a RIM catheter was introduced into the abdominal aorta over a Bentson wire. The aorta is tortuous and variant in caliber with portions that are ectatic, and portions that are small. Heavily calcified atherosclerotic plaque noted throughout. The origin of the inferior mesenteric artery could not be engaged with this catheter. Therefore, the catheter was exchanged for a Mickelson catheter. The Mickelson catheter was formed. Again, the origin of the IMA could not be successfully engaged. The Mickelson catheter was then exchanged for a Sos Omni selective catheter. Despite multiple attempts, the origin of the IMA could not be successfully identified through the heavily calcified plaque. At this point, the decision was made to pursue angiography of the superior mesenteric artery to see if there is a communication into the IMA territory via the middle colic artery. A C2 cobra catheter was advanced over the Bentson wire and used to select the superior mesenteric artery. An angiogram was performed. No obvious communication with the IMA. No evidence of active  bleeding. A renegade STC microcatheter was advanced over a Fathom 16 wire and used to select a proximal branch arising from the IMA. Arteriography was performed. This is a jejunal branch with normal opacification of the jejunal mucosa. No evidence of active bleeding. No communication with the IMA territory. The microcatheter was used to select another branch artery. Contrast injection demonstrates filling of the redundant transverse colon. This appears to be the left middle colic artery colic artery. No evidence of active bleeding. The microcatheter was advanced further into the unnamed branch supplying the distal transverse colon. Super selective arteriography was performed. Again, no evidence of active bleeding. No communication with the main IMA territory. At this time, the decision was made to go back to attempting to catheterize the  inferior mesenteric artery as this would provide the arterial supply to the region of active bleeding seen on the recent CT arteriogram. The Sos microcatheter was reintroduced. Additional arteriography was performed in multiple obliquities in till the origin of the IMA was successfully identified. Due to stenosis at the origin, the catheter tip could not be engaged in the artery. However, the catheter was positioned just outside the arterial ostium and ultimately, the renegade STC microcatheter and Fathom wire were successfully floated into the inferior mesenteric artery. Arteriography was then performed. No evidence of active bleeding on initial angiography. Due to the heavily stenotic origin, opacification of the arteries is somewhat limited. Therefore, the microcatheter was advanced more distally into a sigmoid branch of the IMA. Contrast injection was performed and demonstrates no evidence of active bleeding. The microcatheter was next advanced into the left colic artery. Contrast injection was performed. Again, no evidence of active bleeding. The patient's diverticular bleed  appears to have spontaneously stopped. Catheters were removed. Hemostasis was attained with the aid of a Celt closure device. IMPRESSION: 1. Heavily diseased and calcified arteries. Catheterization of the inferior mesenteric artery was extremely challenging but ultimately successful. 2. No evidence of active bleeding at the time of arteriography. Signed, Sterling Big, MD, RPVI [facility] Radiology Electronically Signed   By: Malachy Moan M.D.   On: 02/18/2020 08:29   IR Angiogram Selective Each Additional Vessel  Result Date: 02/18/2020 INDICATION: 87 year old female with acute sigmoid diverticular bleed. She presents for arteriogram and possible embolization. EXAM: SELECTIVE VISCERAL ARTERIOGRAPHY; IR ULTRASOUND GUIDANCE VASC ACCESS RIGHT; ADDITIONAL ARTERIOGRAPHY MEDICATIONS: None. ANESTHESIA/SEDATION: 1 mg Versed administered for anxiolysis. CONTRAST:  30mL OMNIPAQUE IOHEXOL 300 MG/ML SOLN, OMNIPAQUE IOHEXOL 300 MG/ML SOLN, 30mL OMNIPAQUE IOHEXOL 300 MG/ML SOLN, 30mL OMNIPAQUE IOHEXOL 300 MG/ML SOLN FLUOROSCOPY TIME:  Fluoroscopy Time: 25 minutes 12 seconds (1901 mGy). COMPLICATIONS: None immediate. PROCEDURE: Informed consent was obtained from the patient following explanation of the procedure, risks, benefits and alternatives. The patient understands, agrees and consents for the procedure. All questions were addressed. A time out was performed prior to the initiation of the procedure. Maximal barrier sterile technique utilized including caps, mask, sterile gowns, sterile gloves, large sterile drape, hand hygiene, and Betadine prep. The right common femoral artery was interrogated with ultrasound and found to be widely patent. An image was obtained and stored for the medical record. Local anesthesia was attained by infiltration with 1% lidocaine. A small dermatotomy was made. Under real-time sonographic guidance, the vessel was punctured with  a 21 gauge micropuncture needle. Using standard technique, the initial micro needle was exchanged over a 0.018 micro wire for a transitional 4 [place] micro sheath. The micro sheath was then exchanged over a 0.035 wire for a 5 French vascular sheath. Initially, a RIM catheter was introduced into the abdominal aorta over a Bentson wire. The aorta is tortuous and variant in caliber with portions that are ectatic, and portions that are small. Heavily calcified atherosclerotic plaque noted throughout. The origin of the inferior mesenteric artery could not be engaged with this catheter. Therefore, the catheter was exchanged for a Mickelson catheter. The Mickelson catheter was formed. Again, the origin of the IMA could not be successfully engaged. The Mickelson catheter was then exchanged for a Sos Omni selective catheter. Despite multiple attempts, the origin of the IMA could not be successfully identified through the heavily calcified plaque. At this point, the decision was made to pursue angiography of the superior mesenteric  artery to see if there is a communication into the IMA territory via the middle colic artery. A C2 cobra catheter was advanced over the Bentson wire and used to select the superior mesenteric artery. An angiogram was performed. No obvious communication with the IMA. No evidence of active bleeding. A renegade STC microcatheter was advanced over a Fathom 16 wire and used to select a proximal branch arising from the IMA. Arteriography was performed. This is a jejunal branch with normal opacification of the jejunal mucosa. No evidence of active bleeding. No communication with the IMA territory. The microcatheter was used to select another branch artery. Contrast injection demonstrates filling of the redundant transverse colon. This appears to be the left middle colic artery colic artery. No evidence of active bleeding. The microcatheter was advanced further into the unnamed branch supplying the distal  transverse colon. Super selective arteriography was performed. Again, no evidence of active bleeding. No communication with the main IMA territory. At this time, the decision was made to go back to attempting to catheterize the inferior mesenteric artery as this would provide the arterial supply to the region of active bleeding seen on the recent CT arteriogram. The Sos microcatheter was reintroduced. Additional arteriography was performed in multiple obliquities in till the origin of the IMA was successfully identified. Due to stenosis at the origin, the catheter tip could not be engaged in the artery. However, the catheter was positioned just outside the arterial ostium and ultimately, the renegade STC microcatheter and Fathom wire were successfully floated into the inferior mesenteric artery. Arteriography was then performed. No evidence of active bleeding on initial angiography. Due to the heavily stenotic origin, opacification of the arteries is somewhat limited. Therefore, the microcatheter was advanced more distally into a sigmoid branch of the IMA. Contrast injection was performed and demonstrates no evidence of active bleeding. The microcatheter was next advanced into the left colic artery. Contrast injection was performed. Again, no evidence of active bleeding. The patient's diverticular bleed appears to have spontaneously stopped. Catheters were removed. Hemostasis was attained with the aid of a Celt closure device. IMPRESSION: 1. Heavily diseased and calcified arteries. Catheterization of the inferior mesenteric artery was extremely challenging but ultimately successful. 2. No evidence of active bleeding at the time of arteriography. Signed, Sterling Big, MD, RPVI [facility] Radiology Electronically Signed   By: Malachy Moan M.D.   On: 02/18/2020 08:29   IR Angiogram Selective Each Additional Vessel  Result Date: 02/18/2020 INDICATION:  87 year old female with acute sigmoid diverticular bleed. She presents for arteriogram and possible embolization. EXAM: SELECTIVE VISCERAL ARTERIOGRAPHY; IR ULTRASOUND GUIDANCE VASC ACCESS RIGHT; ADDITIONAL ARTERIOGRAPHY MEDICATIONS: None. ANESTHESIA/SEDATION: 1 mg Versed administered for anxiolysis. CONTRAST:  30mL OMNIPAQUE IOHEXOL 300 MG/ML SOLN, OMNIPAQUE IOHEXOL 300 MG/ML SOLN, 30mL OMNIPAQUE IOHEXOL 300 MG/ML SOLN, 30mL OMNIPAQUE IOHEXOL 300 MG/ML SOLN FLUOROSCOPY TIME:  Fluoroscopy Time: 25 minutes 12 seconds (1901 mGy). COMPLICATIONS: None immediate. PROCEDURE: Informed consent was obtained from the patient following explanation of the procedure, risks, benefits and alternatives. The patient understands, agrees and consents for the procedure. All questions were addressed. A time out was performed prior to the initiation of the procedure. Maximal barrier sterile technique utilized including caps, mask, sterile gowns, sterile gloves, large sterile drape, hand hygiene, and Betadine prep. The right common femoral artery was interrogated with ultrasound and found to be widely patent. An image was obtained and stored for the medical record. Local anesthesia was attained by infiltration with  1% lidocaine. A small dermatotomy was made. Under real-time sonographic guidance, the vessel was punctured with a 21 gauge micropuncture needle. Using standard technique, the initial micro needle was exchanged over a 0.018 micro wire for a transitional 4 [place] micro sheath. The micro sheath was then exchanged over a 0.035 wire for a 5 French vascular sheath. Initially, a RIM catheter was introduced into the abdominal aorta over a Bentson wire. The aorta is tortuous and variant in caliber with portions that are ectatic, and portions that are small. Heavily calcified atherosclerotic plaque noted throughout. The origin of the inferior mesenteric artery could not be engaged with this catheter. Therefore, the catheter was  exchanged for a Mickelson catheter. The Mickelson catheter was formed. Again, the origin of the IMA could not be successfully engaged. The Mickelson catheter was then exchanged for a Sos Omni selective catheter. Despite multiple attempts, the origin of the IMA could not be successfully identified through the heavily calcified plaque. At this point, the decision was made to pursue angiography of the superior mesenteric artery to see if there is a communication into the IMA territory via the middle colic artery. A C2 cobra catheter was advanced over the Bentson wire and used to select the superior mesenteric artery. An angiogram was performed. No obvious communication with the IMA. No evidence of active bleeding. A renegade STC microcatheter was advanced over a Fathom 16 wire and used to select a proximal branch arising from the IMA. Arteriography was performed. This is a jejunal branch with normal opacification of the jejunal mucosa. No evidence of active bleeding. No communication with the IMA territory. The microcatheter was used to select another branch artery. Contrast injection demonstrates filling of the redundant transverse colon. This appears to be the left middle colic artery colic artery. No evidence of active bleeding. The microcatheter was advanced further into the unnamed branch supplying the distal transverse colon. Super selective arteriography was performed. Again, no evidence of active bleeding. No communication with the main IMA territory. At this time, the decision was made to go back to attempting to catheterize the inferior mesenteric artery as this would provide the arterial supply to the region of active bleeding seen on the recent CT arteriogram. The Sos microcatheter was reintroduced. Additional arteriography was performed in multiple obliquities in till the origin of the IMA was successfully identified. Due to stenosis at the origin, the catheter tip could not be engaged in the artery.  However, the catheter was positioned just outside the arterial ostium and ultimately, the renegade STC microcatheter and Fathom wire were successfully floated into the inferior mesenteric artery. Arteriography was then performed. No evidence of active bleeding on initial angiography. Due to the heavily stenotic origin, opacification of the arteries is somewhat limited. Therefore, the microcatheter was advanced more distally into a sigmoid branch of the IMA. Contrast injection was performed and demonstrates no evidence of active bleeding. The microcatheter was next advanced into the left colic artery. Contrast injection was performed. Again, no evidence of active bleeding. The patient's diverticular bleed appears to have spontaneously stopped. Catheters were removed. Hemostasis was attained with the aid of a Celt closure device. IMPRESSION: 1. Heavily diseased and calcified arteries. Catheterization of the inferior mesenteric artery was extremely challenging but ultimately successful. 2. No evidence of active bleeding at the time of arteriography. Signed, Sterling Big, MD, RPVI [facility] Radiology Electronically Signed   By: Malachy Moan M.D.   On: 02/18/2020 08:29   IR  Angiogram Selective Each Additional Vessel  Result Date: 02/18/2020 INDICATION: 87 year old female with acute sigmoid diverticular bleed. She presents for arteriogram and possible embolization. EXAM: SELECTIVE VISCERAL ARTERIOGRAPHY; IR ULTRASOUND GUIDANCE VASC ACCESS RIGHT; ADDITIONAL ARTERIOGRAPHY MEDICATIONS: None. ANESTHESIA/SEDATION: 1 mg Versed administered for anxiolysis. CONTRAST:  30mL OMNIPAQUE IOHEXOL 300 MG/ML SOLN, OMNIPAQUE IOHEXOL 300 MG/ML SOLN, 30mL OMNIPAQUE IOHEXOL 300 MG/ML SOLN, 30mL OMNIPAQUE IOHEXOL 300 MG/ML SOLN FLUOROSCOPY TIME:  Fluoroscopy Time: 25 minutes 12 seconds (1901 mGy). COMPLICATIONS: None immediate. PROCEDURE: Informed consent was obtained  from the patient following explanation of the procedure, risks, benefits and alternatives. The patient understands, agrees and consents for the procedure. All questions were addressed. A time out was performed prior to the initiation of the procedure. Maximal barrier sterile technique utilized including caps, mask, sterile gowns, sterile gloves, large sterile drape, hand hygiene, and Betadine prep. The right common femoral artery was interrogated with ultrasound and found to be widely patent. An image was obtained and stored for the medical record. Local anesthesia was attained by infiltration with 1% lidocaine. A small dermatotomy was made. Under real-time sonographic guidance, the vessel was punctured with a 21 gauge micropuncture needle. Using standard technique, the initial micro needle was exchanged over a 0.018 micro wire for a transitional 4 [place] micro sheath. The micro sheath was then exchanged over a 0.035 wire for a 5 French vascular sheath. Initially, a RIM catheter was introduced into the abdominal aorta over a Bentson wire. The aorta is tortuous and variant in caliber with portions that are ectatic, and portions that are small. Heavily calcified atherosclerotic plaque noted throughout. The origin of the inferior mesenteric artery could not be engaged with this catheter. Therefore, the catheter was exchanged for a Mickelson catheter. The Mickelson catheter was formed. Again, the origin of the IMA could not be successfully engaged. The Mickelson catheter was then exchanged for a Sos Omni selective catheter. Despite multiple attempts, the origin of the IMA could not be successfully identified through the heavily calcified plaque. At this point, the decision was made to pursue angiography of the superior mesenteric artery to see if there is a communication into the IMA territory via the middle colic artery. A C2 cobra catheter was advanced over the Bentson wire and used to select the superior mesenteric  artery. An angiogram was performed. No obvious communication with the IMA. No evidence of active bleeding. A renegade STC microcatheter was advanced over a Fathom 16 wire and used to select a proximal branch arising from the IMA. Arteriography was performed. This is a jejunal branch with normal opacification of the jejunal mucosa. No evidence of active bleeding. No communication with the IMA territory. The microcatheter was used to select another branch artery. Contrast injection demonstrates filling of the redundant transverse colon. This appears to be the left middle colic artery colic artery. No evidence of active bleeding. The microcatheter was advanced further into the unnamed branch supplying the distal transverse colon. Super selective arteriography was performed. Again, no evidence of active bleeding. No communication with the main IMA territory. At this time, the decision was made to go back to attempting to catheterize the inferior mesenteric artery as this would provide the arterial supply to the region of active bleeding seen on the recent CT arteriogram. The Sos microcatheter was reintroduced. Additional arteriography was performed in multiple obliquities in till the origin of the IMA was successfully identified. Due to stenosis at the origin, the catheter tip could not be engaged in the artery. However,  the catheter was positioned just outside the arterial ostium and ultimately, the renegade STC microcatheter and Fathom wire were successfully floated into the inferior mesenteric artery. Arteriography was then performed. No evidence of active bleeding on initial angiography. Due to the heavily stenotic origin, opacification of the arteries is somewhat limited. Therefore, the microcatheter was advanced more distally into a sigmoid branch of the IMA. Contrast injection was performed and demonstrates no evidence of active bleeding. The microcatheter was next advanced into the left colic artery. Contrast  injection was performed. Again, no evidence of active bleeding. The patient's diverticular bleed appears to have spontaneously stopped. Catheters were removed. Hemostasis was attained with the aid of a Celt closure device. IMPRESSION: 1. Heavily diseased and calcified arteries. Catheterization of the inferior mesenteric artery was extremely challenging but ultimately successful. 2. No evidence of active bleeding at the time of arteriography. Signed, Sterling Big, MD, RPVI [facility] Radiology Electronically Signed   By: Malachy Moan M.D.   On: 02/18/2020 08:29   IR Angiogram Selective Each Additional Vessel  Result Date: 02/18/2020 INDICATION: 87 year old female with acute sigmoid diverticular bleed. She presents for arteriogram and possible embolization. EXAM: SELECTIVE VISCERAL ARTERIOGRAPHY; IR ULTRASOUND GUIDANCE VASC ACCESS RIGHT; ADDITIONAL ARTERIOGRAPHY MEDICATIONS: None. ANESTHESIA/SEDATION: 1 mg Versed administered for anxiolysis. CONTRAST:  30mL OMNIPAQUE IOHEXOL 300 MG/ML SOLN, OMNIPAQUE IOHEXOL 300 MG/ML SOLN, 30mL OMNIPAQUE IOHEXOL 300 MG/ML SOLN, 30mL OMNIPAQUE IOHEXOL 300 MG/ML SOLN FLUOROSCOPY TIME:  Fluoroscopy Time: 25 minutes 12 seconds (1901 mGy). COMPLICATIONS: None immediate. PROCEDURE: Informed consent was obtained from the patient following explanation of the procedure, risks, benefits and alternatives. The patient understands, agrees and consents for the procedure. All questions were addressed. A time out was performed prior to the initiation of the procedure. Maximal barrier sterile technique utilized including caps, mask, sterile gowns, sterile gloves, large sterile drape, hand hygiene, and Betadine prep. The right common femoral artery was interrogated with ultrasound and found to be widely patent. An image was obtained and stored for the medical record. Local anesthesia was attained by infiltration with 1% lidocaine.  A small dermatotomy was made. Under real-time sonographic guidance, the vessel was punctured with a 21 gauge micropuncture needle. Using standard technique, the initial micro needle was exchanged over a 0.018 micro wire for a transitional 4 [place] micro sheath. The micro sheath was then exchanged over a 0.035 wire for a 5 French vascular sheath. Initially, a RIM catheter was introduced into the abdominal aorta over a Bentson wire. The aorta is tortuous and variant in caliber with portions that are ectatic, and portions that are small. Heavily calcified atherosclerotic plaque noted throughout. The origin of the inferior mesenteric artery could not be engaged with this catheter. Therefore, the catheter was exchanged for a Mickelson catheter. The Mickelson catheter was formed. Again, the origin of the IMA could not be successfully engaged. The Mickelson catheter was then exchanged for a Sos Omni selective catheter. Despite multiple attempts, the origin of the IMA could not be successfully identified through the heavily calcified plaque. At this point, the decision was made to pursue angiography of the superior mesenteric artery to see if there is a communication into the IMA territory via the middle colic artery. A C2 cobra catheter was advanced over the Bentson wire and used to select the superior mesenteric artery. An angiogram was performed. No obvious communication with the IMA. No evidence of active bleeding. A renegade STC microcatheter was advanced over a Fathom 16 wire  and used to select a proximal branch arising from the IMA. Arteriography was performed. This is a jejunal branch with normal opacification of the jejunal mucosa. No evidence of active bleeding. No communication with the IMA territory. The microcatheter was used to select another branch artery. Contrast injection demonstrates filling of the redundant transverse colon. This appears to be the left middle colic artery colic artery. No evidence of  active bleeding. The microcatheter was advanced further into the unnamed branch supplying the distal transverse colon. Super selective arteriography was performed. Again, no evidence of active bleeding. No communication with the main IMA territory. At this time, the decision was made to go back to attempting to catheterize the inferior mesenteric artery as this would provide the arterial supply to the region of active bleeding seen on the recent CT arteriogram. The Sos microcatheter was reintroduced. Additional arteriography was performed in multiple obliquities in till the origin of the IMA was successfully identified. Due to stenosis at the origin, the catheter tip could not be engaged in the artery. However, the catheter was positioned just outside the arterial ostium and ultimately, the renegade STC microcatheter and Fathom wire were successfully floated into the inferior mesenteric artery. Arteriography was then performed. No evidence of active bleeding on initial angiography. Due to the heavily stenotic origin, opacification of the arteries is somewhat limited. Therefore, the microcatheter was advanced more distally into a sigmoid branch of the IMA. Contrast injection was performed and demonstrates no evidence of active bleeding. The microcatheter was next advanced into the left colic artery. Contrast injection was performed. Again, no evidence of active bleeding. The patient's diverticular bleed appears to have spontaneously stopped. Catheters were removed. Hemostasis was attained with the aid of a Celt closure device. IMPRESSION: 1. Heavily diseased and calcified arteries. Catheterization of the inferior mesenteric artery was extremely challenging but ultimately successful. 2. No evidence of active bleeding at the time of arteriography. Signed, Sterling Big, MD, RPVI [facility] Radiology Electronically Signed   By: Malachy Moan M.D.   On:  02/18/2020 08:29   IR Angiogram Selective Each Additional Vessel  Result Date: 02/18/2020 INDICATION: 87 year old female with acute sigmoid diverticular bleed. She presents for arteriogram and possible embolization. EXAM: SELECTIVE VISCERAL ARTERIOGRAPHY; IR ULTRASOUND GUIDANCE VASC ACCESS RIGHT; ADDITIONAL ARTERIOGRAPHY MEDICATIONS: None. ANESTHESIA/SEDATION: 1 mg Versed administered for anxiolysis. CONTRAST:  30mL OMNIPAQUE IOHEXOL 300 MG/ML SOLN, OMNIPAQUE IOHEXOL 300 MG/ML SOLN, 30mL OMNIPAQUE IOHEXOL 300 MG/ML SOLN, 30mL OMNIPAQUE IOHEXOL 300 MG/ML SOLN FLUOROSCOPY TIME:  Fluoroscopy Time: 25 minutes 12 seconds (1901 mGy). COMPLICATIONS: None immediate. PROCEDURE: Informed consent was obtained from the patient following explanation of the procedure, risks, benefits and alternatives. The patient understands, agrees and consents for the procedure. All questions were addressed. A time out was performed prior to the initiation of the procedure. Maximal barrier sterile technique utilized including caps, mask, sterile gowns, sterile gloves, large sterile drape, hand hygiene, and Betadine prep. The right common femoral artery was interrogated with ultrasound and found to be widely patent. An image was obtained and stored for the medical record. Local anesthesia was attained by infiltration with 1% lidocaine. A small dermatotomy was made. Under real-time sonographic guidance, the vessel was punctured with a 21 gauge micropuncture needle. Using standard technique, the initial micro needle was exchanged over a 0.018 micro wire for a transitional 4 [place] micro sheath. The micro sheath was then exchanged over a 0.035 wire for a 5 French vascular sheath. Initially, a RIM  catheter was introduced into the abdominal aorta over a Bentson wire. The aorta is tortuous and variant in caliber with portions that are ectatic, and portions that are small. Heavily calcified atherosclerotic plaque noted throughout. The origin  of the inferior mesenteric artery could not be engaged with this catheter. Therefore, the catheter was exchanged for a Mickelson catheter. The Mickelson catheter was formed. Again, the origin of the IMA could not be successfully engaged. The Mickelson catheter was then exchanged for a Sos Omni selective catheter. Despite multiple attempts, the origin of the IMA could not be successfully identified through the heavily calcified plaque. At this point, the decision was made to pursue angiography of the superior mesenteric artery to see if there is a communication into the IMA territory via the middle colic artery. A C2 cobra catheter was advanced over the Bentson wire and used to select the superior mesenteric artery. An angiogram was performed. No obvious communication with the IMA. No evidence of active bleeding. A renegade STC microcatheter was advanced over a Fathom 16 wire and used to select a proximal branch arising from the IMA. Arteriography was performed. This is a jejunal branch with normal opacification of the jejunal mucosa. No evidence of active bleeding. No communication with the IMA territory. The microcatheter was used to select another branch artery. Contrast injection demonstrates filling of the redundant transverse colon. This appears to be the left middle colic artery colic artery. No evidence of active bleeding. The microcatheter was advanced further into the unnamed branch supplying the distal transverse colon. Super selective arteriography was performed. Again, no evidence of active bleeding. No communication with the main IMA territory. At this time, the decision was made to go back to attempting to catheterize the inferior mesenteric artery as this would provide the arterial supply to the region of active bleeding seen on the recent CT arteriogram. The Sos microcatheter was reintroduced. Additional arteriography was performed in multiple obliquities in till the origin of the IMA was  successfully identified. Due to stenosis at the origin, the catheter tip could not be engaged in the artery. However, the catheter was positioned just outside the arterial ostium and ultimately, the renegade STC microcatheter and Fathom wire were successfully floated into the inferior mesenteric artery. Arteriography was then performed. No evidence of active bleeding on initial angiography. Due to the heavily stenotic origin, opacification of the arteries is somewhat limited. Therefore, the microcatheter was advanced more distally into a sigmoid branch of the IMA. Contrast injection was performed and demonstrates no evidence of active bleeding. The microcatheter was next advanced into the left colic artery. Contrast injection was performed. Again, no evidence of active bleeding. The patient's diverticular bleed appears to have spontaneously stopped. Catheters were removed. Hemostasis was attained with the aid of a Celt closure device. IMPRESSION: 1. Heavily diseased and calcified arteries. Catheterization of the inferior mesenteric artery was extremely challenging but ultimately successful. 2. No evidence of active bleeding at the time of arteriography. Signed, Sterling Big, MD, RPVI [facility] Radiology Electronically Signed   By: Malachy Moan M.D.   On: 02/18/2020 08:29   IR US Guide Vasc Access Right  Result Date: 02/18/2020 INDICATION: 87 year old female with acute sigmoid diverticular bleed. She presents for arteriogram and possible embolization. EXAM: SELECTIVE VISCERAL ARTERIOGRAPHY; IR ULTRASOUND GUIDANCE VASC ACCESS RIGHT; ADDITIONAL ARTERIOGRAPHY MEDICATIONS: None. ANESTHESIA/SEDATION: 1 mg Versed administered for anxiolysis. CONTRAST:  30mL OMNIPAQUE IOHEXOL 300 MG/ML SOLN, OMNIPAQUE IOHEXOL 300 MG/ML SOLN, 30mL OMNIPAQUE  IOHEXOL 300 MG/ML SOLN, 30mL OMNIPAQUE IOHEXOL 300 MG/ML SOLN FLUOROSCOPY TIME:  Fluoroscopy Time: 25 minutes 12  seconds (1901 mGy). COMPLICATIONS: None immediate. PROCEDURE: Informed consent was obtained from the patient following explanation of the procedure, risks, benefits and alternatives. The patient understands, agrees and consents for the procedure. All questions were addressed. A time out was performed prior to the initiation of the procedure. Maximal barrier sterile technique utilized including caps, mask, sterile gowns, sterile gloves, large sterile drape, hand hygiene, and Betadine prep. The right common femoral artery was interrogated with ultrasound and found to be widely patent. An image was obtained and stored for the medical record. Local anesthesia was attained by infiltration with 1% lidocaine. A small dermatotomy was made. Under real-time sonographic guidance, the vessel was punctured with a 21 gauge micropuncture needle. Using standard technique, the initial micro needle was exchanged over a 0.018 micro wire for a transitional 4 [place] micro sheath. The micro sheath was then exchanged over a 0.035 wire for a 5 French vascular sheath. Initially, a RIM catheter was introduced into the abdominal aorta over a Bentson wire. The aorta is tortuous and variant in caliber with portions that are ectatic, and portions that are small. Heavily calcified atherosclerotic plaque noted throughout. The origin of the inferior mesenteric artery could not be engaged with this catheter. Therefore, the catheter was exchanged for a Mickelson catheter. The Mickelson catheter was formed. Again, the origin of the IMA could not be successfully engaged. The Mickelson catheter was then exchanged for a Sos Omni selective catheter. Despite multiple attempts, the origin of the IMA could not be successfully identified through the heavily calcified plaque. At this point, the decision was made to pursue angiography of the superior mesenteric artery to see if there is a communication into the IMA territory via the middle colic artery. A C2  cobra catheter was advanced over the Bentson wire and used to select the superior mesenteric artery. An angiogram was performed. No obvious communication with the IMA. No evidence of active bleeding. A renegade STC microcatheter was advanced over a Fathom 16 wire and used to select a proximal branch arising from the IMA. Arteriography was performed. This is a jejunal branch with normal opacification of the jejunal mucosa. No evidence of active bleeding. No communication with the IMA territory. The microcatheter was used to select another branch artery. Contrast injection demonstrates filling of the redundant transverse colon. This appears to be the left middle colic artery colic artery. No evidence of active bleeding. The microcatheter was advanced further into the unnamed branch supplying the distal transverse colon. Super selective arteriography was performed. Again, no evidence of active bleeding. No communication with the main IMA territory. At this time, the decision was made to go back to attempting to catheterize the inferior mesenteric artery as this would provide the arterial supply to the region of active bleeding seen on the recent CT arteriogram. The Sos microcatheter was reintroduced. Additional arteriography was performed in multiple obliquities in till the origin of the IMA was successfully identified. Due to stenosis at the origin, the catheter tip could not be engaged in the artery. However, the catheter was positioned just outside the arterial ostium and ultimately, the renegade STC microcatheter and Fathom wire were successfully floated into the inferior mesenteric artery. Arteriography was then performed. No evidence of active bleeding on initial angiography. Due to the heavily stenotic origin, opacification of the arteries is somewhat limited. Therefore, the microcatheter was advanced more distally into a sigmoid  branch of the IMA. Contrast injection was performed and demonstrates no evidence of  active bleeding. The microcatheter was next advanced into the left colic artery. Contrast injection was performed. Again, no evidence of active bleeding. The patient's diverticular bleed appears to have spontaneously stopped. Catheters were removed. Hemostasis was attained with the aid of a Celt closure device. IMPRESSION: 1. Heavily diseased and calcified arteries. Catheterization of the inferior mesenteric artery was extremely challenging but ultimately successful. 2. No evidence of active bleeding at the time of arteriography. Signed, Sterling Big, MD, RPVI [facility] Radiology Electronically Signed   By: Malachy Moan M.D.   On: 02/18/2020 08:29   CT Angio Abd/Pel w/ and/or w/o  Addendum Date: 02/17/2020   ADDENDUM REPORT: 02/17/2020 18:33 ADDENDUM: Critical Value/emergent results were called by telephone at the time of interpretation on 02/17/2020 at 6:32 pm to provider Dr. Tyson Babinski, Who verbally acknowledged these results. Electronically Signed   By: Lupita Raider M.D.   On: 02/17/2020 18:33   Result Date: 02/17/2020 CLINICAL DATA:  Rectal bleeding.  Anemia. EXAM: CTA ABDOMEN AND PELVIS WITHOUT AND WITH CONTRAST TECHNIQUE: Multidetector CT imaging of the abdomen and pelvis was performed using the standard protocol during bolus administration of intravenous contrast. Multiplanar reconstructed images and MIPs were obtained and reviewed to evaluate the vascular anatomy. CONTRAST:  OMNIPAQUE IOHEXOL 350 MG/ML SOLN COMPARISON:  None. FINDINGS: VASCULAR Aorta: Atherosclerosis of thoracic aorta is noted without aneurysm or dissection. Celiac: Patent without evidence of aneurysm, dissection, vasculitis or significant stenosis. SMA: Patent without evidence of aneurysm, dissection, vasculitis or significant stenosis. Renals: 2 right renal arteries are noted which are widely patent without significant stenosis. However, heavily calcified plaque is seen  involving the proximal portion of the left renal artery resulting in moderate to severe focal stenosis. IMA: Patent without evidence of aneurysm, dissection, vasculitis or significant stenosis. Inflow: Common iliac arteries bilaterally are widely patent. However, multiple moderate stenoses are noted in the right external iliac artery secondary to calcified plaque. Multiple moderate stenoses are also noted in the left external iliac artery secondary to calcified plaque. Proximal Outflow: Moderate to severe narrowing is seen involving the visualized portions of the common and superficial femoral arteries bilaterally secondary to calcified plaque. Veins: No obvious venous abnormality within the limitations of this arterial phase study. Review of the MIP images confirms the above findings. NON-VASCULAR Lower chest: Small left pleural effusion is noted with adjacent subsegmental atelectasis. Hepatobiliary: No gallstones or biliary dilatation is noted. Left hepatic cyst is noted. Pancreas: Unremarkable. No pancreatic ductal dilatation or surrounding inflammatory changes. Spleen: Normal in size without focal abnormality. Adrenals/Urinary Tract: Adrenal glands are unremarkable. Kidneys are normal, without renal calculi, focal lesion, or hydronephrosis. Bladder is unremarkable. Stomach/Bowel: Stomach is within normal limits. Appendix appears normal. There is no evidence of bowel obstruction. However, there is seen a small focus of extravasation of contrast in a portion of the proximal sigmoid colon best seen on image number 101 of series 7. This is consistent with active gastrointestinal hemorrhage. Lymphatic: No adenopathy is noted. Reproductive: Uterus and bilateral adnexa are unremarkable. Other: No abdominal wall hernia or abnormality. No abdominopelvic ascites. Musculoskeletal: No acute or significant osseous findings. IMPRESSION: Small focus of extravasation of contrast is seen in a portion of the proximal sigmoid  colon consistent with active gastrointestinal hemorrhage. These results will be called to the ordering clinician or representative by the Radiologist Assistant, and communication documented in the PACS or zVision  Dashboard. Severe stenosis is seen involving proximal portion of left renal artery secondary to calcified plaque. Multiple moderate to severe stenoses are noted in both external iliac arteries as well as the proximal femoral artery secondary to calcified plaque. Small left pleural effusion is noted with adjacent subsegmental atelectasis. Aortic Atherosclerosis (ICD10-I70.0). Electronically Signed: By: Lupita Raider M.D. On: 02/17/2020 18:24      Joycelyn Das, MD  [facility] 02/19/2020  If 7PM-7AM, please contact night-coverage

## 2020-02-19 NOTE — Progress Notes (Addendum)
Progress Note   Subjective  Chief Complaint: GI bleeding  Today, the patient tells me that she feels fairly well.  She denies having any bloody bowel movements yesterday or overnight.  Does tell me though that "they told me my hemoglobin was low", and is currently receiving another unit of PRBCs.  Her daughter who is her POA is at her bedside and asked some questions which were answered to the best of my ability.  Patient is tolerating breakfast without any further nausea or vomiting.   Objective   Vital signs in last 24 hours: Temp:  [97.9 F (36.6 C)-98.4 F (36.9 C)] 97.9 F (36.6 C) (01/04 1004) Pulse Rate:  [81-101] 95 (01/04 1004) Resp:  [14-27] 23 (01/04 1004) BP: (95-143)/(35-113) 139/47 (01/04 0853) SpO2:  [93 %-100 %] 99 % (01/04 1004) Last BM Date: 02/17/20 General:    Elderly white female in NAD Heart:  Regular rate and rhythm; no murmurs Lungs: Respirations even and unlabored, lungs CTA bilaterally Abdomen:  Soft, nontender and nondistended. Normal bowel sounds. Extremities:  Without edema. Neurologic:  Alert and oriented,  grossly normal neurologically. Psych:  Cooperative. Normal mood and affect.  Intake/Output from previous day: 01/03 0701 - 01/04 0700 In: 740 [P.O.:740] Out: 1650 [Urine:1650]  Lab Results: Recent Labs    02/17/20 0526 02/17/20 1528 02/18/20 0115 02/19/20 0254  WBC 8.3  --  9.8 7.5  HGB 7.6* 7.7* 7.7* 6.8*  HCT 23.4* 24.5* 23.2* 20.7*  PLT 184  --  165 156   BMET Recent Labs    02/17/20 0526 02/18/20 0115 02/19/20 0254  NA 142 136 139  K 3.8 3.7 3.4*  CL 111 108 108  CO2 24 21* 23  GLUCOSE 104* 122* 125*  BUN 22 16 19   CREATININE 0.57 0.58 0.65  CALCIUM 8.5* 7.8* 8.5*   Studies/Results: IR Angiogram Visceral Selective  Result Date: 02/18/2020 INDICATION: 86 year old female with acute sigmoid diverticular bleed. She presents for arteriogram and possible embolization. EXAM: SELECTIVE VISCERAL ARTERIOGRAPHY; IR ULTRASOUND  GUIDANCE VASC ACCESS RIGHT; ADDITIONAL ARTERIOGRAPHY MEDICATIONS: None. ANESTHESIA/SEDATION: 1 mg Versed administered for anxiolysis. CONTRAST:  36mL OMNIPAQUE IOHEXOL 300 MG/ML SOLN, 170mL OMNIPAQUE IOHEXOL 300 MG/ML SOLN, 23mL OMNIPAQUE IOHEXOL 300 MG/ML SOLN, 85mL OMNIPAQUE IOHEXOL 300 MG/ML SOLN FLUOROSCOPY TIME:  Fluoroscopy Time: 25 minutes 12 seconds (1901 mGy). COMPLICATIONS: None immediate. PROCEDURE: Informed consent was obtained from the patient following explanation of the procedure, risks, benefits and alternatives. The patient understands, agrees and consents for the procedure. All questions were addressed. A time out was performed prior to the initiation of the procedure. Maximal barrier sterile technique utilized including caps, mask, sterile gowns, sterile gloves, large sterile drape, hand hygiene, and Betadine prep. The right common femoral artery was interrogated with ultrasound and found to be widely patent. An image was obtained and stored for the medical record. Local anesthesia was attained by infiltration with 1% lidocaine. A small dermatotomy was made. Under real-time sonographic guidance, the vessel was punctured with a 21 gauge micropuncture needle. Using standard technique, the initial micro needle was exchanged over a 0.018 micro wire for a transitional 4 [place] micro sheath. The micro sheath was then exchanged over a 0.035 wire for a 5 French vascular sheath. Initially, a RIM catheter was introduced into the abdominal aorta over a Bentson wire. The aorta is tortuous and variant in caliber with portions that are ectatic, and portions that are small. Heavily calcified atherosclerotic plaque noted throughout. The origin of the inferior mesenteric artery  could not be engaged with this catheter. Therefore, the catheter was exchanged for a Mickelson catheter. The Mickelson catheter was formed. Again, the origin of the IMA could not be successfully engaged. The Mickelson catheter was then  exchanged for a Sos Omni selective catheter. Despite multiple attempts, the origin of the IMA could not be successfully identified through the heavily calcified plaque. At this point, the decision was made to pursue angiography of the superior mesenteric artery to see if there is a communication into the IMA territory via the middle colic artery. A C2 cobra catheter was advanced over the Bentson wire and used to select the superior mesenteric artery. An angiogram was performed. No obvious communication with the IMA. No evidence of active bleeding. A renegade STC microcatheter was advanced over a Fathom 16 wire and used to select a proximal branch arising from the IMA. Arteriography was performed. This is a jejunal branch with normal opacification of the jejunal mucosa. No evidence of active bleeding. No communication with the IMA territory. The microcatheter was used to select another branch artery. Contrast injection demonstrates filling of the redundant transverse colon. This appears to be the left middle colic artery colic artery. No evidence of active bleeding. The microcatheter was advanced further into the unnamed branch supplying the distal transverse colon. Super selective arteriography was performed. Again, no evidence of active bleeding. No communication with the main IMA territory. At this time, the decision was made to go back to attempting to catheterize the inferior mesenteric artery as this would provide the arterial supply to the region of active bleeding seen on the recent CT arteriogram. The Sos microcatheter was reintroduced. Additional arteriography was performed in multiple obliquities in till the origin of the IMA was successfully identified. Due to stenosis at the origin, the catheter tip could not be engaged in the artery. However, the catheter was positioned just outside the arterial ostium and ultimately, the renegade STC microcatheter and Fathom wire were successfully floated into the  inferior mesenteric artery. Arteriography was then performed. No evidence of active bleeding on initial angiography. Due to the heavily stenotic origin, opacification of the arteries is somewhat limited. Therefore, the microcatheter was advanced more distally into a sigmoid branch of the IMA. Contrast injection was performed and demonstrates no evidence of active bleeding. The microcatheter was next advanced into the left colic artery. Contrast injection was performed. Again, no evidence of active bleeding. The patient's diverticular bleed appears to have spontaneously stopped. Catheters were removed. Hemostasis was attained with the aid of a Celt closure device. IMPRESSION: 1. Heavily diseased and calcified arteries. Catheterization of the inferior mesenteric artery was extremely challenging but ultimately successful. 2. No evidence of active bleeding at the time of arteriography. Signed, Criselda Peaches, MD, [facility] Electronically Signed   By: Jacqulynn Cadet M.D.   On: 02/18/2020 08:29   IR Angiogram Visceral Selective  Result Date: 02/18/2020 INDICATION: 87 year old female with acute sigmoid diverticular bleed. She presents for arteriogram and possible embolization. EXAM: SELECTIVE VISCERAL ARTERIOGRAPHY; IR ULTRASOUND GUIDANCE VASC ACCESS RIGHT; ADDITIONAL ARTERIOGRAPHY MEDICATIONS: None. ANESTHESIA/SEDATION: 1 mg Versed administered for anxiolysis. CONTRAST:  5mL OMNIPAQUE IOHEXOL 300 MG/ML SOLN, 163mL OMNIPAQUE IOHEXOL 300 MG/ML SOLN, 51mL OMNIPAQUE IOHEXOL 300 MG/ML SOLN, 72mL OMNIPAQUE IOHEXOL 300 MG/ML SOLN FLUOROSCOPY TIME:  Fluoroscopy Time: 25 minutes 12 seconds (1901 mGy). COMPLICATIONS: None immediate. PROCEDURE: Informed consent was obtained from the patient following explanation of the procedure, risks, benefits and alternatives. The patient understands,  agrees and consents for the procedure. All questions were addressed.  A time out was performed prior to the initiation of the procedure. Maximal barrier sterile technique utilized including caps, mask, sterile gowns, sterile gloves, large sterile drape, hand hygiene, and Betadine prep. The right common femoral artery was interrogated with ultrasound and found to be widely patent. An image was obtained and stored for the medical record. Local anesthesia was attained by infiltration with 1% lidocaine. A small dermatotomy was made. Under real-time sonographic guidance, the vessel was punctured with a 21 gauge micropuncture needle. Using standard technique, the initial micro needle was exchanged over a 0.018 micro wire for a transitional 4 [place] micro sheath. The micro sheath was then exchanged over a 0.035 wire for a 5 French vascular sheath. Initially, a RIM catheter was introduced into the abdominal aorta over a Bentson wire. The aorta is tortuous and variant in caliber with portions that are ectatic, and portions that are small. Heavily calcified atherosclerotic plaque noted throughout. The origin of the inferior mesenteric artery could not be engaged with this catheter. Therefore, the catheter was exchanged for a Mickelson catheter. The Mickelson catheter was formed. Again, the origin of the IMA could not be successfully engaged. The Mickelson catheter was then exchanged for a Sos Omni selective catheter. Despite multiple attempts, the origin of the IMA could not be successfully identified through the heavily calcified plaque. At this point, the decision was made to pursue angiography of the superior mesenteric artery to see if there is a communication into the IMA territory via the middle colic artery. A C2 cobra catheter was advanced over the Bentson wire and used to select the superior mesenteric artery. An angiogram was performed. No obvious communication with the IMA. No evidence of active bleeding. A renegade STC microcatheter was advanced over a Fathom 16 wire and used to  select a proximal branch arising from the IMA. Arteriography was performed. This is a jejunal branch with normal opacification of the jejunal mucosa. No evidence of active bleeding. No communication with the IMA territory. The microcatheter was used to select another branch artery. Contrast injection demonstrates filling of the redundant transverse colon. This appears to be the left middle colic artery colic artery. No evidence of active bleeding. The microcatheter was advanced further into the unnamed branch supplying the distal transverse colon. Super selective arteriography was performed. Again, no evidence of active bleeding. No communication with the main IMA territory. At this time, the decision was made to go back to attempting to catheterize the inferior mesenteric artery as this would provide the arterial supply to the region of active bleeding seen on the recent CT arteriogram. The Sos microcatheter was reintroduced. Additional arteriography was performed in multiple obliquities in till the origin of the IMA was successfully identified. Due to stenosis at the origin, the catheter tip could not be engaged in the artery. However, the catheter was positioned just outside the arterial ostium and ultimately, the renegade STC microcatheter and Fathom wire were successfully floated into the inferior mesenteric artery. Arteriography was then performed. No evidence of active bleeding on initial angiography. Due to the heavily stenotic origin, opacification of the arteries is somewhat limited. Therefore, the microcatheter was advanced more distally into a sigmoid branch of the IMA. Contrast injection was performed and demonstrates no evidence of active bleeding. The microcatheter was next advanced into the left colic artery. Contrast injection was performed. Again, no evidence of active bleeding. The patient's diverticular bleed appears to have spontaneously stopped.  Catheters were removed. Hemostasis was attained  with the aid of a Celt closure device. IMPRESSION: 1. Heavily diseased and calcified arteries. Catheterization of the inferior mesenteric artery was extremely challenging but ultimately successful. 2. No evidence of active bleeding at the time of arteriography. Signed, Criselda Peaches, MD, [facility] Electronically Signed   By: Jacqulynn Cadet M.D.   On: 02/18/2020 08:29   IR Angiogram Selective Each Additional Vessel  Result Date: 02/18/2020 INDICATION: 87 year old female with acute sigmoid diverticular bleed. She presents for arteriogram and possible embolization. EXAM: SELECTIVE VISCERAL ARTERIOGRAPHY; IR ULTRASOUND GUIDANCE VASC ACCESS RIGHT; ADDITIONAL ARTERIOGRAPHY MEDICATIONS: None. ANESTHESIA/SEDATION: 1 mg Versed administered for anxiolysis. CONTRAST:  40mL OMNIPAQUE IOHEXOL 300 MG/ML SOLN, 130mL OMNIPAQUE IOHEXOL 300 MG/ML SOLN, 23mL OMNIPAQUE IOHEXOL 300 MG/ML SOLN, 65mL OMNIPAQUE IOHEXOL 300 MG/ML SOLN FLUOROSCOPY TIME:  Fluoroscopy Time: 25 minutes 12 seconds (1901 mGy). COMPLICATIONS: None immediate. PROCEDURE: Informed consent was obtained from the patient following explanation of the procedure, risks, benefits and alternatives. The patient understands, agrees and consents for the procedure. All questions were addressed. A time out was performed prior to the initiation of the procedure. Maximal barrier sterile technique utilized including caps, mask, sterile gowns, sterile gloves, large sterile drape, hand hygiene, and Betadine prep. The right common femoral artery was interrogated with ultrasound and found to be widely patent. An image was obtained and stored for the medical record. Local anesthesia was attained by infiltration with 1% lidocaine. A small dermatotomy was made. Under real-time sonographic guidance, the vessel was punctured with a 21 gauge micropuncture needle. Using standard technique, the initial micro needle  was exchanged over a 0.018 micro wire for a transitional 4 [place] micro sheath. The micro sheath was then exchanged over a 0.035 wire for a 5 French vascular sheath. Initially, a RIM catheter was introduced into the abdominal aorta over a Bentson wire. The aorta is tortuous and variant in caliber with portions that are ectatic, and portions that are small. Heavily calcified atherosclerotic plaque noted throughout. The origin of the inferior mesenteric artery could not be engaged with this catheter. Therefore, the catheter was exchanged for a Mickelson catheter. The Mickelson catheter was formed. Again, the origin of the IMA could not be successfully engaged. The Mickelson catheter was then exchanged for a Sos Omni selective catheter. Despite multiple attempts, the origin of the IMA could not be successfully identified through the heavily calcified plaque. At this point, the decision was made to pursue angiography of the superior mesenteric artery to see if there is a communication into the IMA territory via the middle colic artery. A C2 cobra catheter was advanced over the Bentson wire and used to select the superior mesenteric artery. An angiogram was performed. No obvious communication with the IMA. No evidence of active bleeding. A renegade STC microcatheter was advanced over a Fathom 16 wire and used to select a proximal branch arising from the IMA. Arteriography was performed. This is a jejunal branch with normal opacification of the jejunal mucosa. No evidence of active bleeding. No communication with the IMA territory. The microcatheter was used to select another branch artery. Contrast injection demonstrates filling of the redundant transverse colon. This appears to be the left middle colic artery colic artery. No evidence of active bleeding. The microcatheter was advanced further into the unnamed branch supplying the distal transverse colon. Super selective arteriography was performed. Again, no evidence of  active bleeding. No communication with the main IMA territory. At  this time, the decision was made to go back to attempting to catheterize the inferior mesenteric artery as this would provide the arterial supply to the region of active bleeding seen on the recent CT arteriogram. The Sos microcatheter was reintroduced. Additional arteriography was performed in multiple obliquities in till the origin of the IMA was successfully identified. Due to stenosis at the origin, the catheter tip could not be engaged in the artery. However, the catheter was positioned just outside the arterial ostium and ultimately, the renegade STC microcatheter and Fathom wire were successfully floated into the inferior mesenteric artery. Arteriography was then performed. No evidence of active bleeding on initial angiography. Due to the heavily stenotic origin, opacification of the arteries is somewhat limited. Therefore, the microcatheter was advanced more distally into a sigmoid branch of the IMA. Contrast injection was performed and demonstrates no evidence of active bleeding. The microcatheter was next advanced into the left colic artery. Contrast injection was performed. Again, no evidence of active bleeding. The patient's diverticular bleed appears to have spontaneously stopped. Catheters were removed. Hemostasis was attained with the aid of a Celt closure device. IMPRESSION: 1. Heavily diseased and calcified arteries. Catheterization of the inferior mesenteric artery was extremely challenging but ultimately successful. 2. No evidence of active bleeding at the time of arteriography. Signed, Criselda Peaches, MD, [facility] Electronically Signed   By: Jacqulynn Cadet M.D.   On: 02/18/2020 08:29   IR Angiogram Selective Each Additional Vessel  Result Date: 02/18/2020 INDICATION: 88 year old female with acute sigmoid diverticular bleed. She presents for arteriogram  and possible embolization. EXAM: SELECTIVE VISCERAL ARTERIOGRAPHY; IR ULTRASOUND GUIDANCE VASC ACCESS RIGHT; ADDITIONAL ARTERIOGRAPHY MEDICATIONS: None. ANESTHESIA/SEDATION: 1 mg Versed administered for anxiolysis. CONTRAST:  60mL OMNIPAQUE IOHEXOL 300 MG/ML SOLN, 168mL OMNIPAQUE IOHEXOL 300 MG/ML SOLN, 23mL OMNIPAQUE IOHEXOL 300 MG/ML SOLN, 8mL OMNIPAQUE IOHEXOL 300 MG/ML SOLN FLUOROSCOPY TIME:  Fluoroscopy Time: 25 minutes 12 seconds (1901 mGy). COMPLICATIONS: None immediate. PROCEDURE: Informed consent was obtained from the patient following explanation of the procedure, risks, benefits and alternatives. The patient understands, agrees and consents for the procedure. All questions were addressed. A time out was performed prior to the initiation of the procedure. Maximal barrier sterile technique utilized including caps, mask, sterile gowns, sterile gloves, large sterile drape, hand hygiene, and Betadine prep. The right common femoral artery was interrogated with ultrasound and found to be widely patent. An image was obtained and stored for the medical record. Local anesthesia was attained by infiltration with 1% lidocaine. A small dermatotomy was made. Under real-time sonographic guidance, the vessel was punctured with a 21 gauge micropuncture needle. Using standard technique, the initial micro needle was exchanged over a 0.018 micro wire for a transitional 4 [place] micro sheath. The micro sheath was then exchanged over a 0.035 wire for a 5 French vascular sheath. Initially, a RIM catheter was introduced into the abdominal aorta over a Bentson wire. The aorta is tortuous and variant in caliber with portions that are ectatic, and portions that are small. Heavily calcified atherosclerotic plaque noted throughout. The origin of the inferior mesenteric artery could not be engaged with this catheter. Therefore, the catheter was exchanged for a Mickelson catheter. The Mickelson catheter was formed. Again, the origin  of the IMA could not be successfully engaged. The Mickelson catheter was then exchanged for a Sos Omni selective catheter. Despite multiple attempts, the origin of the IMA could not be successfully identified through the heavily calcified plaque.  At this point, the decision was made to pursue angiography of the superior mesenteric artery to see if there is a communication into the IMA territory via the middle colic artery. A C2 cobra catheter was advanced over the Bentson wire and used to select the superior mesenteric artery. An angiogram was performed. No obvious communication with the IMA. No evidence of active bleeding. A renegade STC microcatheter was advanced over a Fathom 16 wire and used to select a proximal branch arising from the IMA. Arteriography was performed. This is a jejunal branch with normal opacification of the jejunal mucosa. No evidence of active bleeding. No communication with the IMA territory. The microcatheter was used to select another branch artery. Contrast injection demonstrates filling of the redundant transverse colon. This appears to be the left middle colic artery colic artery. No evidence of active bleeding. The microcatheter was advanced further into the unnamed branch supplying the distal transverse colon. Super selective arteriography was performed. Again, no evidence of active bleeding. No communication with the main IMA territory. At this time, the decision was made to go back to attempting to catheterize the inferior mesenteric artery as this would provide the arterial supply to the region of active bleeding seen on the recent CT arteriogram. The Sos microcatheter was reintroduced. Additional arteriography was performed in multiple obliquities in till the origin of the IMA was successfully identified. Due to stenosis at the origin, the catheter tip could not be engaged in the artery. However, the catheter was positioned just outside the arterial ostium and ultimately, the  renegade STC microcatheter and Fathom wire were successfully floated into the inferior mesenteric artery. Arteriography was then performed. No evidence of active bleeding on initial angiography. Due to the heavily stenotic origin, opacification of the arteries is somewhat limited. Therefore, the microcatheter was advanced more distally into a sigmoid branch of the IMA. Contrast injection was performed and demonstrates no evidence of active bleeding. The microcatheter was next advanced into the left colic artery. Contrast injection was performed. Again, no evidence of active bleeding. The patient's diverticular bleed appears to have spontaneously stopped. Catheters were removed. Hemostasis was attained with the aid of a Celt closure device. IMPRESSION: 1. Heavily diseased and calcified arteries. Catheterization of the inferior mesenteric artery was extremely challenging but ultimately successful. 2. No evidence of active bleeding at the time of arteriography. Signed, Criselda Peaches, MD, [facility] Electronically Signed   By: Jacqulynn Cadet M.D.   On: 02/18/2020 08:29   IR Angiogram Selective Each Additional Vessel  Result Date: 02/18/2020 INDICATION: 87 year old female with acute sigmoid diverticular bleed. She presents for arteriogram and possible embolization. EXAM: SELECTIVE VISCERAL ARTERIOGRAPHY; IR ULTRASOUND GUIDANCE VASC ACCESS RIGHT; ADDITIONAL ARTERIOGRAPHY MEDICATIONS: None. ANESTHESIA/SEDATION: 1 mg Versed administered for anxiolysis. CONTRAST:  29mL OMNIPAQUE IOHEXOL 300 MG/ML SOLN, 174mL OMNIPAQUE IOHEXOL 300 MG/ML SOLN, 9mL OMNIPAQUE IOHEXOL 300 MG/ML SOLN, 57mL OMNIPAQUE IOHEXOL 300 MG/ML SOLN FLUOROSCOPY TIME:  Fluoroscopy Time: 25 minutes 12 seconds (1901 mGy). COMPLICATIONS: None immediate. PROCEDURE: Informed consent was obtained from the patient following explanation of the procedure, risks, benefits and alternatives.  The patient understands, agrees and consents for the procedure. All questions were addressed. A time out was performed prior to the initiation of the procedure. Maximal barrier sterile technique utilized including caps, mask, sterile gowns, sterile gloves, large sterile drape, hand hygiene, and Betadine prep. The right common femoral artery was interrogated with ultrasound and found to be widely patent. An image was  obtained and stored for the medical record. Local anesthesia was attained by infiltration with 1% lidocaine. A small dermatotomy was made. Under real-time sonographic guidance, the vessel was punctured with a 21 gauge micropuncture needle. Using standard technique, the initial micro needle was exchanged over a 0.018 micro wire for a transitional 4 [place] micro sheath. The micro sheath was then exchanged over a 0.035 wire for a 5 French vascular sheath. Initially, a RIM catheter was introduced into the abdominal aorta over a Bentson wire. The aorta is tortuous and variant in caliber with portions that are ectatic, and portions that are small. Heavily calcified atherosclerotic plaque noted throughout. The origin of the inferior mesenteric artery could not be engaged with this catheter. Therefore, the catheter was exchanged for a Mickelson catheter. The Mickelson catheter was formed. Again, the origin of the IMA could not be successfully engaged. The Mickelson catheter was then exchanged for a Sos Omni selective catheter. Despite multiple attempts, the origin of the IMA could not be successfully identified through the heavily calcified plaque. At this point, the decision was made to pursue angiography of the superior mesenteric artery to see if there is a communication into the IMA territory via the middle colic artery. A C2 cobra catheter was advanced over the Bentson wire and used to select the superior mesenteric artery. An angiogram was performed. No obvious communication with the IMA. No evidence of  active bleeding. A renegade STC microcatheter was advanced over a Fathom 16 wire and used to select a proximal branch arising from the IMA. Arteriography was performed. This is a jejunal branch with normal opacification of the jejunal mucosa. No evidence of active bleeding. No communication with the IMA territory. The microcatheter was used to select another branch artery. Contrast injection demonstrates filling of the redundant transverse colon. This appears to be the left middle colic artery colic artery. No evidence of active bleeding. The microcatheter was advanced further into the unnamed branch supplying the distal transverse colon. Super selective arteriography was performed. Again, no evidence of active bleeding. No communication with the main IMA territory. At this time, the decision was made to go back to attempting to catheterize the inferior mesenteric artery as this would provide the arterial supply to the region of active bleeding seen on the recent CT arteriogram. The Sos microcatheter was reintroduced. Additional arteriography was performed in multiple obliquities in till the origin of the IMA was successfully identified. Due to stenosis at the origin, the catheter tip could not be engaged in the artery. However, the catheter was positioned just outside the arterial ostium and ultimately, the renegade STC microcatheter and Fathom wire were successfully floated into the inferior mesenteric artery. Arteriography was then performed. No evidence of active bleeding on initial angiography. Due to the heavily stenotic origin, opacification of the arteries is somewhat limited. Therefore, the microcatheter was advanced more distally into a sigmoid branch of the IMA. Contrast injection was performed and demonstrates no evidence of active bleeding. The microcatheter was next advanced into the left colic artery. Contrast injection was performed. Again, no evidence of active bleeding. The patient's diverticular  bleed appears to have spontaneously stopped. Catheters were removed. Hemostasis was attained with the aid of a Celt closure device. IMPRESSION: 1. Heavily diseased and calcified arteries. Catheterization of the inferior mesenteric artery was extremely challenging but ultimately successful. 2. No evidence of active bleeding at the time of arteriography. Signed, Criselda Peaches, MD, [facility] Radiology Electronically Signed  By: Jacqulynn Cadet M.D.   On: 02/18/2020 08:29   IR Angiogram Selective Each Additional Vessel  Result Date: 02/18/2020 INDICATION: 87 year old female with acute sigmoid diverticular bleed. She presents for arteriogram and possible embolization. EXAM: SELECTIVE VISCERAL ARTERIOGRAPHY; IR ULTRASOUND GUIDANCE VASC ACCESS RIGHT; ADDITIONAL ARTERIOGRAPHY MEDICATIONS: None. ANESTHESIA/SEDATION: 1 mg Versed administered for anxiolysis. CONTRAST:  45mL OMNIPAQUE IOHEXOL 300 MG/ML SOLN, 134mL OMNIPAQUE IOHEXOL 300 MG/ML SOLN, 74mL OMNIPAQUE IOHEXOL 300 MG/ML SOLN, 54mL OMNIPAQUE IOHEXOL 300 MG/ML SOLN FLUOROSCOPY TIME:  Fluoroscopy Time: 25 minutes 12 seconds (1901 mGy). COMPLICATIONS: None immediate. PROCEDURE: Informed consent was obtained from the patient following explanation of the procedure, risks, benefits and alternatives. The patient understands, agrees and consents for the procedure. All questions were addressed. A time out was performed prior to the initiation of the procedure. Maximal barrier sterile technique utilized including caps, mask, sterile gowns, sterile gloves, large sterile drape, hand hygiene, and Betadine prep. The right common femoral artery was interrogated with ultrasound and found to be widely patent. An image was obtained and stored for the medical record. Local anesthesia was attained by infiltration with 1% lidocaine. A small dermatotomy was made. Under real-time sonographic guidance, the vessel was  punctured with a 21 gauge micropuncture needle. Using standard technique, the initial micro needle was exchanged over a 0.018 micro wire for a transitional 4 [place] micro sheath. The micro sheath was then exchanged over a 0.035 wire for a 5 French vascular sheath. Initially, a RIM catheter was introduced into the abdominal aorta over a Bentson wire. The aorta is tortuous and variant in caliber with portions that are ectatic, and portions that are small. Heavily calcified atherosclerotic plaque noted throughout. The origin of the inferior mesenteric artery could not be engaged with this catheter. Therefore, the catheter was exchanged for a Mickelson catheter. The Mickelson catheter was formed. Again, the origin of the IMA could not be successfully engaged. The Mickelson catheter was then exchanged for a Sos Omni selective catheter. Despite multiple attempts, the origin of the IMA could not be successfully identified through the heavily calcified plaque. At this point, the decision was made to pursue angiography of the superior mesenteric artery to see if there is a communication into the IMA territory via the middle colic artery. A C2 cobra catheter was advanced over the Bentson wire and used to select the superior mesenteric artery. An angiogram was performed. No obvious communication with the IMA. No evidence of active bleeding. A renegade STC microcatheter was advanced over a Fathom 16 wire and used to select a proximal branch arising from the IMA. Arteriography was performed. This is a jejunal branch with normal opacification of the jejunal mucosa. No evidence of active bleeding. No communication with the IMA territory. The microcatheter was used to select another branch artery. Contrast injection demonstrates filling of the redundant transverse colon. This appears to be the left middle colic artery colic artery. No evidence of active bleeding. The microcatheter was advanced further into the unnamed branch  supplying the distal transverse colon. Super selective arteriography was performed. Again, no evidence of active bleeding. No communication with the main IMA territory. At this time, the decision was made to go back to attempting to catheterize the inferior mesenteric artery as this would provide the arterial supply to the region of active bleeding seen on the recent CT arteriogram. The Sos microcatheter was reintroduced. Additional arteriography was performed in multiple obliquities in till the origin of the IMA was successfully identified. Due to stenosis at  the origin, the catheter tip could not be engaged in the artery. However, the catheter was positioned just outside the arterial ostium and ultimately, the renegade STC microcatheter and Fathom wire were successfully floated into the inferior mesenteric artery. Arteriography was then performed. No evidence of active bleeding on initial angiography. Due to the heavily stenotic origin, opacification of the arteries is somewhat limited. Therefore, the microcatheter was advanced more distally into a sigmoid branch of the IMA. Contrast injection was performed and demonstrates no evidence of active bleeding. The microcatheter was next advanced into the left colic artery. Contrast injection was performed. Again, no evidence of active bleeding. The patient's diverticular bleed appears to have spontaneously stopped. Catheters were removed. Hemostasis was attained with the aid of a Celt closure device. IMPRESSION: 1. Heavily diseased and calcified arteries. Catheterization of the inferior mesenteric artery was extremely challenging but ultimately successful. 2. No evidence of active bleeding at the time of arteriography. Signed, Criselda Peaches, MD, [facility] Radiology Electronically Signed   By: Jacqulynn Cadet M.D.   On: 02/18/2020 08:29   IR Angiogram Selective Each Additional Vessel  Result Date:  02/18/2020 INDICATION: 87 year old female with acute sigmoid diverticular bleed. She presents for arteriogram and possible embolization. EXAM: SELECTIVE VISCERAL ARTERIOGRAPHY; IR ULTRASOUND GUIDANCE VASC ACCESS RIGHT; ADDITIONAL ARTERIOGRAPHY MEDICATIONS: None. ANESTHESIA/SEDATION: 1 mg Versed administered for anxiolysis. CONTRAST:  90mL OMNIPAQUE IOHEXOL 300 MG/ML SOLN, 140mL OMNIPAQUE IOHEXOL 300 MG/ML SOLN, 64mL OMNIPAQUE IOHEXOL 300 MG/ML SOLN, 25mL OMNIPAQUE IOHEXOL 300 MG/ML SOLN FLUOROSCOPY TIME:  Fluoroscopy Time: 25 minutes 12 seconds (1901 mGy). COMPLICATIONS: None immediate. PROCEDURE: Informed consent was obtained from the patient following explanation of the procedure, risks, benefits and alternatives. The patient understands, agrees and consents for the procedure. All questions were addressed. A time out was performed prior to the initiation of the procedure. Maximal barrier sterile technique utilized including caps, mask, sterile gowns, sterile gloves, large sterile drape, hand hygiene, and Betadine prep. The right common femoral artery was interrogated with ultrasound and found to be widely patent. An image was obtained and stored for the medical record. Local anesthesia was attained by infiltration with 1% lidocaine. A small dermatotomy was made. Under real-time sonographic guidance, the vessel was punctured with a 21 gauge micropuncture needle. Using standard technique, the initial micro needle was exchanged over a 0.018 micro wire for a transitional 4 [place] micro sheath. The micro sheath was then exchanged over a 0.035 wire for a 5 French vascular sheath. Initially, a RIM catheter was introduced into the abdominal aorta over a Bentson wire. The aorta is tortuous and variant in caliber with portions that are ectatic, and portions that are small. Heavily calcified atherosclerotic plaque noted throughout. The origin of the inferior mesenteric artery could not be engaged with this catheter.  Therefore, the catheter was exchanged for a Mickelson catheter. The Mickelson catheter was formed. Again, the origin of the IMA could not be successfully engaged. The Mickelson catheter was then exchanged for a Sos Omni selective catheter. Despite multiple attempts, the origin of the IMA could not be successfully identified through the heavily calcified plaque. At this point, the decision was made to pursue angiography of the superior mesenteric artery to see if there is a communication into the IMA territory via the middle colic artery. A C2 cobra catheter was advanced over the Bentson wire and used to select the superior mesenteric artery. An angiogram was performed. No obvious communication with the IMA. No evidence of  active bleeding. A renegade STC microcatheter was advanced over a Fathom 16 wire and used to select a proximal branch arising from the IMA. Arteriography was performed. This is a jejunal branch with normal opacification of the jejunal mucosa. No evidence of active bleeding. No communication with the IMA territory. The microcatheter was used to select another branch artery. Contrast injection demonstrates filling of the redundant transverse colon. This appears to be the left middle colic artery colic artery. No evidence of active bleeding. The microcatheter was advanced further into the unnamed branch supplying the distal transverse colon. Super selective arteriography was performed. Again, no evidence of active bleeding. No communication with the main IMA territory. At this time, the decision was made to go back to attempting to catheterize the inferior mesenteric artery as this would provide the arterial supply to the region of active bleeding seen on the recent CT arteriogram. The Sos microcatheter was reintroduced. Additional arteriography was performed in multiple obliquities in till the origin of the IMA was successfully identified. Due to stenosis at the origin, the catheter tip could not be  engaged in the artery. However, the catheter was positioned just outside the arterial ostium and ultimately, the renegade STC microcatheter and Fathom wire were successfully floated into the inferior mesenteric artery. Arteriography was then performed. No evidence of active bleeding on initial angiography. Due to the heavily stenotic origin, opacification of the arteries is somewhat limited. Therefore, the microcatheter was advanced more distally into a sigmoid branch of the IMA. Contrast injection was performed and demonstrates no evidence of active bleeding. The microcatheter was next advanced into the left colic artery. Contrast injection was performed. Again, no evidence of active bleeding. The patient's diverticular bleed appears to have spontaneously stopped. Catheters were removed. Hemostasis was attained with the aid of a Celt closure device. IMPRESSION: 1. Heavily diseased and calcified arteries. Catheterization of the inferior mesenteric artery was extremely challenging but ultimately successful. 2. No evidence of active bleeding at the time of arteriography. Signed, Criselda Peaches, MD, [place] [facility]iology Electronically Signed   By: Jacqulynn Cadet M.D.   On: 02/18/2020 08:29   IR US Guide Vasc Access Right  Result Date: 02/18/2020 INDICATION: 87 year old female with acute sigmoid diverticular bleed. She presents for arteriogram and possible embolization. EXAM: SELECTIVE VISCERAL ARTERIOGRAPHY; IR ULTRASOUND GUIDANCE VASC ACCESS RIGHT; ADDITIONAL ARTERIOGRAPHY MEDICATIONS: None. ANESTHESIA/SEDATION: 1 mg Versed administered for anxiolysis. CONTRAST:  37mL OMNIPAQUE IOHEXOL 300 MG/ML SOLN, 14mL OMNIPAQUE IOHEXOL 300 MG/ML SOLN, 47mL OMNIPAQUE IOHEXOL 300 MG/ML SOLN, 52mL OMNIPAQUE IOHEXOL 300 MG/ML SOLN FLUOROSCOPY TIME:  Fluoroscopy Time: 25 minutes 12 seconds (1901 mGy). COMPLICATIONS: None immediate. PROCEDURE: Informed consent was  obtained from the patient following explanation of the procedure, risks, benefits and alternatives. The patient understands, agrees and consents for the procedure. All questions were addressed. A time out was performed prior to the initiation of the procedure. Maximal barrier sterile technique utilized including caps, mask, sterile gowns, sterile gloves, large sterile drape, hand hygiene, and Betadine prep. The right common femoral artery was interrogated with ultrasound and found to be widely patent. An image was obtained and stored for the medical record. Local anesthesia was attained by infiltration with 1% lidocaine. A small dermatotomy was made. Under real-time sonographic guidance, the vessel was punctured with a 21 gauge micropuncture needle. Using standard technique, the initial micro needle was exchanged over a 0.018 micro wire for a transitional 4 [place] micro sheath. The micro sheath was then exchanged  over a 0.035 wire for a 5 [place] vascular sheath. Initially, a RIM catheter was introduced into the abdominal aorta over a Bentson wire. The aorta is tortuous and variant in caliber with portions that are ectatic, and portions that are small. Heavily calcified atherosclerotic plaque noted throughout. The origin of the inferior mesenteric artery could not be engaged with this catheter. Therefore, the catheter was exchanged for a Mickelson catheter. The Mickelson catheter was formed. Again, the origin of the IMA could not be successfully engaged. The Mickelson catheter was then exchanged for a Sos Omni selective catheter. Despite multiple attempts, the origin of the IMA could not be successfully identified through the heavily calcified plaque. At this point, the decision was made to pursue angiography of the superior mesenteric artery to see if there is a communication into the IMA territory via the middle colic artery. A C2 cobra catheter was advanced over the Bentson wire and used to select the superior  mesenteric artery. An angiogram was performed. No obvious communication with the IMA. No evidence of active bleeding. A renegade STC microcatheter was advanced over a Fathom 16 wire and used to select a proximal branch arising from the IMA. Arteriography was performed. This is a jejunal branch with normal opacification of the jejunal mucosa. No evidence of active bleeding. No communication with the IMA territory. The microcatheter was used to select another branch artery. Contrast injection demonstrates filling of the redundant transverse colon. This appears to be the left middle colic artery colic artery. No evidence of active bleeding. The microcatheter was advanced further into the unnamed branch supplying the distal transverse colon. Super selective arteriography was performed. Again, no evidence of active bleeding. No communication with the main IMA territory. At this time, the decision was made to go back to attempting to catheterize the inferior mesenteric artery as this would provide the arterial supply to the region of active bleeding seen on the recent CT arteriogram. The Sos microcatheter was reintroduced. Additional arteriography was performed in multiple obliquities in till the origin of the IMA was successfully identified. Due to stenosis at the origin, the catheter tip could not be engaged in the artery. However, the catheter was positioned just outside the arterial ostium and ultimately, the renegade STC microcatheter and Fathom wire were successfully floated into the inferior mesenteric artery. Arteriography was then performed. No evidence of active bleeding on initial angiography. Due to the heavily stenotic origin, opacification of the arteries is somewhat limited. Therefore, the microcatheter was advanced more distally into a sigmoid branch of the IMA. Contrast injection was performed and demonstrates no evidence of active bleeding. The microcatheter was next advanced into the left colic artery.  Contrast injection was performed. Again, no evidence of active bleeding. The patient's diverticular bleed appears to have spontaneously stopped. Catheters were removed. Hemostasis was attained with the aid of a Celt closure device. IMPRESSION: 1. Heavily diseased and calcified arteries. Catheterization of the inferior mesenteric artery was extremely challenging but ultimately successful. 2. No evidence of active bleeding at the time of arteriography. Signed, Sterling Big, MD, [facility]iology Electronically Signed   By: Malachy Moan M.D.   On: 02/18/2020 08:29   CT Angio Abd/Pel w/ and/or w/o  Addendum Date: 02/17/2020   ADDENDUM REPORT: 02/17/2020 18:33 ADDENDUM: Critical Value/emergent results were called by telephone at the time of interpretation on 02/17/2020 at 6:32 pm to provider Dr. Tyson Babinski, Who verbally acknowledged these results. Electronically Signed   By: Fayrene Fearing  Murlean Caller M.D.   On: 02/17/2020 18:33   Result Date: 02/17/2020 CLINICAL DATA:  Rectal bleeding.  Anemia. EXAM: CTA ABDOMEN AND PELVIS WITHOUT AND WITH CONTRAST TECHNIQUE: Multidetector CT imaging of the abdomen and pelvis was performed using the standard protocol during bolus administration of intravenous contrast. Multiplanar reconstructed images and MIPs were obtained and reviewed to evaluate the vascular anatomy. CONTRAST:  141mL OMNIPAQUE IOHEXOL 350 MG/ML SOLN COMPARISON:  None. FINDINGS: VASCULAR Aorta: Atherosclerosis of thoracic aorta is noted without aneurysm or dissection. Celiac: Patent without evidence of aneurysm, dissection, vasculitis or significant stenosis. SMA: Patent without evidence of aneurysm, dissection, vasculitis or significant stenosis. Renals: 2 right renal arteries are noted which are widely patent without significant stenosis. However, heavily calcified plaque is seen involving the proximal portion of the left renal artery resulting in moderate to  severe focal stenosis. IMA: Patent without evidence of aneurysm, dissection, vasculitis or significant stenosis. Inflow: Common iliac arteries bilaterally are widely patent. However, multiple moderate stenoses are noted in the right external iliac artery secondary to calcified plaque. Multiple moderate stenoses are also noted in the left external iliac artery secondary to calcified plaque. Proximal Outflow: Moderate to severe narrowing is seen involving the visualized portions of the common and superficial femoral arteries bilaterally secondary to calcified plaque. Veins: No obvious venous abnormality within the limitations of this arterial phase study. Review of the MIP images confirms the above findings. NON-VASCULAR Lower chest: Small left pleural effusion is noted with adjacent subsegmental atelectasis. Hepatobiliary: No gallstones or biliary dilatation is noted. Left hepatic cyst is noted. Pancreas: Unremarkable. No pancreatic ductal dilatation or surrounding inflammatory changes. Spleen: Normal in size without focal abnormality. Adrenals/Urinary Tract: Adrenal glands are unremarkable. Kidneys are normal, without renal calculi, focal lesion, or hydronephrosis. Bladder is unremarkable. Stomach/Bowel: Stomach is within normal limits. Appendix appears normal. There is no evidence of bowel obstruction. However, there is seen a small focus of extravasation of contrast in a portion of the proximal sigmoid colon best seen on image number 101 of series 7. This is consistent with active gastrointestinal hemorrhage. Lymphatic: No adenopathy is noted. Reproductive: Uterus and bilateral adnexa are unremarkable. Other: No abdominal wall hernia or abnormality. No abdominopelvic ascites. Musculoskeletal: No acute or significant osseous findings. IMPRESSION: Small focus of extravasation of contrast is seen in a portion of the proximal sigmoid colon consistent with active gastrointestinal hemorrhage. These results will be  called to the ordering clinician or representative by the Radiologist Assistant, and communication documented in the PACS or zVision Dashboard. Severe stenosis is seen involving proximal portion of left renal artery secondary to calcified plaque. Multiple moderate to severe stenoses are noted in both external iliac arteries as well as the proximal femoral artery secondary to calcified plaque. Small left pleural effusion is noted with adjacent subsegmental atelectasis. Aortic Atherosclerosis (ICD10-I70.0). Electronically Signed: By: Marijo Conception M.D. On: 02/17/2020 18:24       Assessment / Plan:   Assessment: 1.  Diverticular bleed: Started 02/16/2020, EGD unrevealing, CTA positive for successful catheterization of inferior mesenteric artery but no active bleeding at the time of exam, hemoglobin 10.9--> 8.1--> 7.7--> large bloody bowel movement--> 1 unit PRBCs--> 7.7--> 6.8--> 1 unit PRBCs 2.  Acute blood loss anemia secondary to above 3.  Fibromyalgia 4.  COPD  Plan: 1.  Continue to monitor hemoglobin with transfusion as needed less than 7 2.  Please awaiting further recommendations from Dr. Fuller Plan later today  Thank you for your kind consultation, we will continue  to follow.   LOS: 3 days   Levin Erp  02/19/2020, 10:11 AM      Attending Physician Note   I have taken an interval history, reviewed the chart and examined the patient. I agree with the Advanced Practitioner's note, impression and recommendations.   * Diverticular bleed without recurrent bleeding reported. Diet advanced to regular  * ABL anemia. Continue to trend CBC and transfuse as indicated  If no recurrent bleeding and Hgb stable consider discharge tomorrow.  Outpatient GI follow up with Dr. Carlean Purl prn. Post hospital outpatient follow up with PCP to follow anemia.   Lucio Edward, MD [facility] Gastroenterology

## 2020-02-19 NOTE — Progress Notes (Signed)
CRITICAL VALUE ALERT  Critical Value: Hemoglobin 6.8  Date & Time Notied:  02/19/19 @ 0335  Provider Notified: [facility] floor coverage   Orders Received/Actions taken: awaiting orders will continue to monitor.

## 2020-02-20 ENCOUNTER — Encounter: Payer: Self-pay | Admitting: Nurse Practitioner

## 2020-02-20 DIAGNOSIS — D62 Acute posthemorrhagic anemia: Secondary | ICD-10-CM | POA: Diagnosis not present

## 2020-02-20 DIAGNOSIS — J449 Chronic obstructive pulmonary disease, unspecified: Secondary | ICD-10-CM | POA: Diagnosis not present

## 2020-02-20 DIAGNOSIS — K921 Melena: Secondary | ICD-10-CM | POA: Diagnosis not present

## 2020-02-20 DIAGNOSIS — K2950 Unspecified chronic gastritis without bleeding: Secondary | ICD-10-CM | POA: Insufficient documentation

## 2020-02-20 DIAGNOSIS — K5731 Diverticulosis of large intestine without perforation or abscess with bleeding: Secondary | ICD-10-CM | POA: Diagnosis not present

## 2020-02-20 LAB — BPAM RBC
Blood Product Expiration Date: 202201192359
Blood Product Expiration Date: 202201202359
ISSUE DATE / TIME: 202201021819
ISSUE DATE / TIME: 202201040822
Unit Type and Rh: 6200
Unit Type and Rh: 6200

## 2020-02-20 LAB — TYPE AND SCREEN
ABO/RH(D): A POS
Antibody Screen: NEGATIVE
Unit division: 0
Unit division: 0

## 2020-02-20 LAB — BASIC METABOLIC PANEL
Anion gap: 8 (ref 5–15)
BUN: 15 mg/dL (ref 8–23)
CO2: 24 mmol/L (ref 22–32)
Calcium: 8.2 mg/dL — ABNORMAL LOW (ref 8.9–10.3)
Chloride: 108 mmol/L (ref 98–111)
Creatinine, Ser: 0.71 mg/dL (ref 0.44–1.00)
GFR, Estimated: >60 mL/min (ref >60)
Glucose, Bld: 104 mg/dL — ABNORMAL HIGH (ref 70–99)
Potassium: 3.4 mmol/L — ABNORMAL LOW (ref 3.5–5.1)
Sodium: 140 mmol/L (ref 135–145)

## 2020-02-20 LAB — CBC
HCT: 23.1 % — ABNORMAL LOW (ref 36.0–46.0)
Hemoglobin: 7.7 g/dL — ABNORMAL LOW (ref 12.0–15.0)
MCH: 29.3 pg (ref 26.0–34.0)
MCHC: 33.3 g/dL (ref 30.0–36.0)
MCV: 87.8 fL (ref 80.0–100.0)
Platelets: 158 10*3/uL (ref 150–400)
RBC: 2.63 MIL/uL — ABNORMAL LOW (ref 3.87–5.11)
RDW: 17.4 % — ABNORMAL HIGH (ref 11.5–15.5)
WBC: 6.1 10*3/uL (ref 4.0–10.5)
nRBC: 0 % (ref 0.0–0.2)

## 2020-02-20 LAB — SURGICAL PATHOLOGY

## 2020-02-20 LAB — CYTOLOGY - NON PAP

## 2020-02-20 MED ORDER — POTASSIUM CHLORIDE CRYS ER 20 MEQ PO TBCR
40.0000 meq | EXTENDED_RELEASE_TABLET | Freq: Once | ORAL | Status: AC
  Administered 2020-02-20: 40 meq via ORAL
  Filled 2020-02-20: qty 2

## 2020-02-20 NOTE — Progress Notes (Addendum)
    Progress Note   Subjective  Chief Complaint: GI bleed  Today, the patient tells me that she continues to do well, she denies having further bloody bowel movements.  She is tolerating a regular diet and asked when she is going to get her breakfast.  When I am in the room the phlebotomist comes to draw her labs, so we do not have results for hemoglobin as of this morning.  She denies any new complaints or concerns.    Objective   Vital signs in last 24 hours: Temp:  [97.4 F (36.3 C)-98.2 F (36.8 C)] 97.4 F (36.3 C) (01/04 2330) Pulse Rate:  [74-102] 74 (01/04 2330) Resp:  [14-23] 14 (01/04 2000) BP: (99-135)/(34-76) 131/52 (01/04 2330) SpO2:  [94 %-100 %] 94 % (01/05 0747) Weight:  [62.9 kg] 62.9 kg (01/04 1554) Last BM Date: 02/17/20 General:    Elderly white female in NAD Heart:  Regular rate and rhythm; no murmurs Lungs: Respirations even and unlabored, lungs CTA bilaterally Abdomen:  Soft, nontender and nondistended. Normal bowel sounds. Psych:  Cooperative. Normal mood and affect.  Intake/Output from previous day: 01/04 0701 - 01/05 0700 In: 705 [P.O.:400; Blood:305] Out: 500 [Urine:500]  Lab Results: Recent Labs    02/17/20 1528 02/18/20 0115 02/19/20 0254  WBC  --  9.8 7.5  HGB 7.7* 7.7* 6.8*  HCT 24.5* 23.2* 20.7*  PLT  --  165 156   BMET Recent Labs    02/18/20 0115 02/19/20 0254  NA 136 139  K 3.7 3.4*  CL 108 108  CO2 21* 23  GLUCOSE 122* 125*  BUN 16 19  CREATININE 0.58 0.65  CALCIUM 7.8* 8.5*     Assessment / Plan:   Assessment: 1.  Diverticular bleed: Started 02/16/2020, EGD unrevealing, CTA positive for successful catheterization of inferior mesenteric artery but no active bleeding at the time of exam, hemoglobin 10.9--> 8.1--> 7.7--> large bloody bowel movement--> 1 unit PRBCs--> 7.7--> 6.8--> 1 unit PRBCs--> hemoglobin pending today 2.  Acute blood loss anemia secondary to above 3.  COPD  Plan: 1.  Continue to monitor hemoglobin  with transfusion as needed less than 7 2.  If hemoglobin has remained stable over the past 24 hours and patient could be discharged home later today, would recommend PT to get the patient up and out of bed as I do not think she has been walking around since admission 3.  Please await any final recommendations from Dr. Russella Dar later today.  If she is discharged she does not need a scheduled GI follow-up, just as needed  Thank you for kind consultation.    LOS: 4 days   Unk Lightning  02/20/2020, 9:37 AM      Attending Physician Note   I have taken an interval history, reviewed the chart and examined the patient. I agree with the Advanced Practitioner's note, impression and recommendations.   Resolving diverticular bleed. ABL anemia. Appears stable for discharge from GI standpoint.  GI follow up prn. Post hospital follow up with PCP to manage anemia.   Claudette Head, MD [facility] Gastroenterology

## 2020-02-20 NOTE — Progress Notes (Addendum)
PROGRESS NOTE    Lisa King  EXB:[id number] DOB: March 13, 1931 DOA: 02/16/2020 PCP: Mast, Man X, NP   Brief Narrative: Lisa King is a 87 y.o. female with a history of COPD, hemorrhoids, hypertension, hyperlipidemia, GERD. Patient presented secondary to GI bleeding likely related to diverticulosis.   Assessment & Plan:   Principal Problem:   GI bleed Active Problems:   COPD (chronic obstructive pulmonary disease) with chronic bronchitis (HCC)   ABLA (acute blood loss anemia)   Hypertension   Systolic murmur   Hematochezia   Diverticulosis of large intestine with hemorrhage   GI bleeding Diverticular bleed Acute blood loss anemia GI consulted. Patient started on empiric Protonix. EGD significant for possible mild candida esophagitis. CTA abdomen/pelvis significant for no active bleed. Hemoglobin baseline of 10-11 prior to admission with admission hemoglobin of 9.8. Hemoglobin downtrend to a low of 6.8. Patient required 2 units of PRBC total. Stable. Bowel movement that was non-bloody/melanotic prior to discharge. Gastric biopsy significant for chronic gastritis with findings consistent with atrophic gastritis. Outpatient GI follow-up with Dr. Leone Payor prn. -GI recommendations: None -Continue Protonix  Primary hypertension -Continue metoprolol  Fibromyalgia -Continue Lyrica  COPD -Continue Breo Ellipta  Mild hypokalemia Potassium supplementation given and hypokalemia resolved.  DVT prophylaxis: SCDs Code Status:   Code Status: DNR Family Communication: None at bedside Disposition Plan: Stable for discharge, however no safe discharge plan yet: discharge to ALF in AM pending negative COVID-19 test.    Consultants:   GI  Interventional radiology  Procedures:   UPPER ENDOSCOPY (02/17/2019) Impression:               - Non-bleeding gastric ulcers with no stigmata of                            bleeding. Biopsied.                           - ?Mild Candida  esophagitis (brushed) Moderate Sedation:      Not Applicable - Patient had care per Anesthesia. Recommendation:           - Return patient to hospital ward for ongoing care.                           - Advance diet as tolerated.                           - Continue Protonix 40 mg p.o. once a day.                           - Await pathology results. If brushings are                            positive for Candida, will call in Diflucan.                           - Rinse/gargle after steroid inhalers.                           - Patient was not keen on getting colonoscopy  performed.                           - Avoid nonsteroidals, if possible.                           - Trend CBC. If Hb stable in a.m. and no further                            bleeding, can D/C to SNF.                           - The findings and recommendations were discussed                            with the patient.  Antimicrobials:  None    Subjective: No issues overnight. States she does not remember having a bowel movement but nursing states patient had a non-bloody bowel movement this morning.  Objective: Vitals:   02/19/20 2330 02/20/20 0744 02/20/20 0747 02/20/20 1353  BP: (!) 131/52   (!) 133/58  Pulse: 74   67  Resp:    14  Temp: (!) 97.4 F (36.3 C)   98.1 F (36.7 C)  TempSrc: Oral   Oral  SpO2: 94% 94% 94% 94%  Weight:      Height:       No intake or output data in the 24 hours ending 02/20/20 1505 Filed Weights   02/16/20 0829 02/17/20 1226 02/19/20 1554  Weight: 60.3 kg 60.3 kg 62.9 kg    Examination:  General exam: Appears calm and comfortable Respiratory system: Clear to auscultation. Respiratory effort normal. Cardiovascular system: S1 & S2 heard, RRR. 2/6 systolic murmur Gastrointestinal system: Abdomen is nondistended, soft and nontender. No organomegaly or masses felt. Normal bowel sounds heard. Central nervous system: Alert. No focal neurological  deficits. Musculoskeletal: No edema. No calf tenderness Skin: No cyanosis. No rashes Psychiatry: Judgement and insight appear normal. Mood & affect appropriate.     Data Reviewed: I have personally reviewed following labs and imaging studies  CBC Lab Results  Component Value Date   WBC 6.1 02/20/2020   RBC 2.63 (L) 02/20/2020   HGB 7.7 (L) 02/20/2020   HCT 23.1 (L) 02/20/2020   MCV 87.8 02/20/2020   MCH 29.3 02/20/2020   PLT 158 02/20/2020   MCHC 33.3 02/20/2020   RDW 17.4 (H) 02/20/2020   LYMPHSABS 1.9 12/07/2015   MONOABS 0.3 12/07/2015   EOSABS 0.1 12/07/2015   BASOSABS 0.0 12/07/2015     Last metabolic panel Lab Results  Component Value Date   NA 140 02/20/2020   K 3.4 (L) 02/20/2020   CL 108 02/20/2020   CO2 24 02/20/2020   BUN 15 02/20/2020   CREATININE 0.71 02/20/2020   GLUCOSE 104 (H) 02/20/2020   GFRNONAA >60 02/20/2020   GFRAA 79 04/12/2019   CALCIUM 8.2 (L) 02/20/2020   PROT 6.0 (L) 02/16/2020   ALBUMIN 3.8 02/16/2020   BILITOT 0.6 02/16/2020   ALKPHOS 72 02/16/2020   AST 18 02/16/2020   ALT 15 02/16/2020   ANIONGAP 8 02/20/2020    CBG (last 3)  No results for input(s): GLUCAP in the last 72 hours.   GFR: Estimated Creatinine Clearance: 44 mL/min (by C-G formula based on SCr of 0.71  mg/dL).  Coagulation Profile: No results for input(s): INR, PROTIME in the last 168 hours.  Recent Results (from the past 240 hour(s))  Resp Panel by RT-PCR (Flu A&B, Covid) Nasopharyngeal Swab     Status: None   Collection Time: 02/16/20  7:00 AM   Specimen: Nasopharyngeal Swab; Nasopharyngeal(NP) swabs in vial transport medium  Result Value Ref Range Status   SARS Coronavirus 2 by RT PCR NEGATIVE NEGATIVE Final    Comment: (NOTE) SARS-CoV-2 target nucleic acids are NOT DETECTED.  The SARS-CoV-2 RNA is generally detectable in upper respiratory specimens during the acute phase of infection. The lowest concentration of SARS-CoV-2 viral copies this assay can  detect is 138 copies/mL. A negative result does not preclude SARS-Cov-2 infection and should not be used as the sole basis for treatment or other patient management decisions. A negative result may occur with  improper specimen collection/handling, submission of specimen other than nasopharyngeal swab, presence of viral mutation(s) within the areas targeted by this assay, and inadequate number of viral copies(<138 copies/mL). A negative result must be combined with clinical observations, patient history, and epidemiological information. The expected result is Negative.  Fact Sheet for Patients:  BloggerCourse.com  Fact Sheet for Healthcare Providers:  SeriousBroker.it  This test is no t yet approved or cleared by the [place] FDA and  has been authorized for detection and/or diagnosis of SARS-CoV-2 by FDA under an Emergency Use Authorization (EUA). This EUA will remain  in effect (meaning this test can be used) for the duration of the COVID-19 declaration under Section 564(b)(1) of the Act, 21 U.S.C.section 360bbb-3(b)(1), unless the authorization is terminated  or revoked sooner.       Influenza A by PCR NEGATIVE NEGATIVE Final   Influenza B by PCR NEGATIVE NEGATIVE Final    Comment: (NOTE) The Xpert Xpress SARS-CoV-2/FLU/RSV plus assay is intended as an aid in the diagnosis of influenza from Nasopharyngeal swab specimens and should not be used as a sole basis for treatment. Nasal washings and aspirates are unacceptable for Xpert Xpress SARS-CoV-2/FLU/RSV testing.  Fact Sheet for Patients: BloggerCourse.com  Fact Sheet for Healthcare Providers: SeriousBroker.it  This test is not yet approved or cleared by the [place] FDA and has been authorized for detection and/or diagnosis of SARS-CoV-2 by FDA under an Emergency Use Authorization (EUA). This EUA will remain in  effect (meaning this test can be used) for the duration of the COVID-19 declaration under Section 564(b)(1) of the Act, 21 U.S.C. section 360bbb-3(b)(1), unless the authorization is terminated or revoked.  Performed at [facility], [address] [place], [place] [address]   MRSA PCR Screening     Status: None   Collection Time: 02/17/20  9:41 PM   Specimen: Nasopharyngeal  Result Value Ref Range Status   MRSA by PCR NEGATIVE NEGATIVE Final    Comment:        The GeneXpert MRSA Assay (FDA approved for NASAL specimens only), is one component of a comprehensive MRSA colonization surveillance program. It is not intended to diagnose MRSA infection nor to guide or monitor treatment for MRSA infections. Performed at [facility], [address], [place], [place] [address]         Radiology Studies: No results found.      Scheduled Meds: . sodium chloride   Intravenous Once  . sodium chloride   Intravenous Once  . Chlorhexidine Gluconate Cloth  6 each Topical Daily  . cyclobenzaprine  2.5 mg Oral QHS  .  estradiol  1 Applicatorful Vaginal Q M,W,F  . fluticasone furoate-vilanterol  1 puff Inhalation Daily  . metoprolol tartrate  12.5 mg Oral BID  . pantoprazole  40 mg Oral Q0600  . [START ON 02/21/2020] polyethylene glycol  17 g Oral Once per day on Mon Thu  . pregabalin  50 mg Oral Daily  . sodium chloride flush  3 mL Intravenous Q12H   Continuous Infusions:   LOS: 4 days     Jacquelin Hawking, MD [facility] 02/20/2020, 3:05 PM  If 7PM-7AM, please contact night-coverage [email or web address]

## 2020-02-20 NOTE — Care Management Important Message (Signed)
Important Message  Patient Details IM Letter given to the Patient. Name: Lisa King MRN: [id number] Date of Birth: Feb 23, 1931   Medicare Important Message Given:  Yes     Caren Macadam 02/20/2020, 11:04 AM

## 2020-02-20 NOTE — Evaluation (Signed)
Physical Therapy Evaluation Patient Details Name: Lisa King MRN: [id number] DOB: 03-08-1931 Today's Date: 02/20/2020   History of Present Illness  87 yo female admitted with GI bleed. Hx of osteoporosis, hearing loss, fibromyalgia  Clinical Impression  On eval, pt was Supv-Mod Ind with mobility. She walked ~150 feet around the unit with a RW. Pt tolerated activity fairly well. She stated she doesn't quite feel back to baseline yet. She is hopeful to d/c back to her ALF today. Will recommend HHPT f/u.     Follow Up Recommendations Home health PT (at ALF)    Equipment Recommendations  None recommended by PT    Recommendations for Other Services       Precautions / Restrictions Precautions Precautions: Fall Restrictions Weight Bearing Restrictions: No      Mobility  Bed Mobility Overal bed mobility: Modified Independent                  Transfers Overall transfer level: Needs assistance Equipment used: Rolling walker (2 wheeled) Transfers: Sit to/from Stand Sit to Stand: Modified independent (Device/Increase time)            Ambulation/Gait Ambulation/Gait assistance: Supervision Gait Distance (Feet): 150 Feet Assistive device: Rolling walker (2 wheeled) Gait Pattern/deviations: Step-through pattern;Decreased stride length     General Gait Details: slow but steady gait speed. pt tolerated distance well. she denied dizziness.  Stairs            Wheelchair Mobility    Modified Rankin (Stroke Patients Only)       Balance Overall balance assessment: Needs assistance         Standing balance support: Bilateral upper extremity supported Standing balance-Leahy Scale: Fair                               Pertinent Vitals/Pain Pain Assessment: No/denies pain    Home Living Family/patient expects to be discharged to:: Assisted living ([facility])               Home Equipment: Dan Humphreys - 4 wheels      Prior Function  Level of Independence: Needs assistance   Gait / Transfers Assistance Needed: uses rollator. ambulates to dining room     Comments: pt stated staff at ALF help her with whatever she needs     Hand Dominance        Extremity/Trunk Assessment   Upper Extremity Assessment Upper Extremity Assessment: Generalized weakness    Lower Extremity Assessment Lower Extremity Assessment: Generalized weakness    Cervical / Trunk Assessment Cervical / Trunk Assessment: Kyphotic  Communication   Communication: No difficulties  Cognition Arousal/Alertness: Awake/alert Behavior During Therapy: WFL for tasks assessed/performed Overall Cognitive Status: Within Functional Limits for tasks assessed                                        General Comments      Exercises     Assessment/Plan    PT Assessment Patient needs continued PT services  PT Problem List Decreased mobility       PT Treatment Interventions DME instruction;Gait training;Therapeutic activities;Therapeutic exercise;Patient/family education;Balance training;Functional mobility training    PT Goals (Current goals can be found in the Care Plan section)  Acute Rehab PT Goals Patient Stated Goal: home today PT Goal Formulation: With patient Time For Goal Achievement:  03/05/20 Potential to Achieve Goals: Good    Frequency Min 3X/week   Barriers to discharge        Co-evaluation               AM-PAC PT "6 Clicks" Mobility  Outcome Measure Help needed turning from your back to your side while in a flat bed without using bedrails?: None Help needed moving from lying on your back to sitting on the side of a flat bed without using bedrails?: None Help needed moving to and from a bed to a chair (including a wheelchair)?: A Little Help needed standing up from a chair using your arms (e.g., wheelchair or bedside chair)?: A Little Help needed to walk in hospital room?: A Little Help needed climbing  3-5 steps with a railing? : A Little 6 Click Score: 20    End of Session Equipment Utilized During Treatment: Gait belt Activity Tolerance: Patient tolerated treatment well Patient left: in bed;with call bell/phone within reach;with bed alarm set   PT Visit Diagnosis: Muscle weakness (generalized) (M62.81)    Time: 5573-2202 PT Time Calculation (min) (ACUTE ONLY): 17 min   Charges:   PT Evaluation $PT Eval Low Complexity: 1 Low            Faye Ramsay, PT Acute Rehabilitation  Office: [phone] Pager: [phone]

## 2020-02-20 NOTE — Progress Notes (Signed)
Chaplain engaged in initial visit with Lisa King.  [name] noted that she is excited about the possibility of discharging today.  Chaplain affirmed her joy.  [name] understands she is waiting on blood work to determine if she will be able to leave.    Chaplain offered support and presence.  Chaplain available to follow-up as needed.

## 2020-02-21 ENCOUNTER — Encounter: Payer: Self-pay | Admitting: Nurse Practitioner

## 2020-02-21 ENCOUNTER — Non-Acute Institutional Stay: Payer: Medicare Other | Admitting: Nurse Practitioner

## 2020-02-21 DIAGNOSIS — M797 Fibromyalgia: Secondary | ICD-10-CM | POA: Diagnosis not present

## 2020-02-21 DIAGNOSIS — R442 Other hallucinations: Secondary | ICD-10-CM | POA: Insufficient documentation

## 2020-02-21 DIAGNOSIS — K922 Gastrointestinal hemorrhage, unspecified: Secondary | ICD-10-CM | POA: Diagnosis not present

## 2020-02-21 DIAGNOSIS — K5731 Diverticulosis of large intestine without perforation or abscess with bleeding: Secondary | ICD-10-CM | POA: Diagnosis not present

## 2020-02-21 DIAGNOSIS — K5901 Slow transit constipation: Secondary | ICD-10-CM

## 2020-02-21 DIAGNOSIS — N390 Urinary tract infection, site not specified: Secondary | ICD-10-CM

## 2020-02-21 DIAGNOSIS — J449 Chronic obstructive pulmonary disease, unspecified: Secondary | ICD-10-CM | POA: Diagnosis not present

## 2020-02-21 DIAGNOSIS — N952 Postmenopausal atrophic vaginitis: Secondary | ICD-10-CM

## 2020-02-21 DIAGNOSIS — M8949 Other hypertrophic osteoarthropathy, multiple sites: Secondary | ICD-10-CM

## 2020-02-21 DIAGNOSIS — D62 Acute posthemorrhagic anemia: Secondary | ICD-10-CM | POA: Diagnosis not present

## 2020-02-21 DIAGNOSIS — I10 Essential (primary) hypertension: Secondary | ICD-10-CM

## 2020-02-21 DIAGNOSIS — M159 Polyosteoarthritis, unspecified: Secondary | ICD-10-CM

## 2020-02-21 DIAGNOSIS — J4489 Other specified chronic obstructive pulmonary disease: Secondary | ICD-10-CM

## 2020-02-21 DIAGNOSIS — M150 Primary generalized (osteo)arthritis: Secondary | ICD-10-CM

## 2020-02-21 DIAGNOSIS — K219 Gastro-esophageal reflux disease without esophagitis: Secondary | ICD-10-CM

## 2020-02-21 LAB — SARS CORONAVIRUS 2 (TAT 6-24 HRS): SARS Coronavirus 2: NEGATIVE

## 2020-02-21 MED ORDER — PANTOPRAZOLE SODIUM 40 MG PO TBEC: 40.0000 mg | DELAYED_RELEASE_TABLET | Freq: Every day | ORAL | Status: DC

## 2020-02-21 MED ORDER — PREGABALIN 50 MG PO CAPS: 50.0000 mg | ORAL_CAPSULE | Freq: Every day | ORAL | 0 refills | Status: DC

## 2020-02-21 NOTE — Assessment & Plan Note (Signed)
MiraLax 2x/wk

## 2020-02-21 NOTE — Assessment & Plan Note (Signed)
Tactile hallucinations, off meds.

## 2020-02-21 NOTE — Assessment & Plan Note (Signed)
Chronic lower back, leg pain, on Flexeril 2.5mg qhs, Lyrica 50mg qd, 

## 2020-02-21 NOTE — Assessment & Plan Note (Signed)
GERD/chronic gastritis/atrophic gastritis  per biopsy, takes Protonix. F/u GI prn 

## 2020-02-21 NOTE — Assessment & Plan Note (Signed)
Fibromyalgia takes Lyrica

## 2020-02-21 NOTE — Assessment & Plan Note (Signed)
COPD, takes [employer]

## 2020-02-21 NOTE — Progress Notes (Signed)
Location:    [facility] Room Number: [id number] Place of Service:  ALF (13) Provider: Marlana Latus NP  Tadan Shill X, NP  Patient Care Team: Taelor Moncada X, NP as PCP - General (Internal Medicine) Japheth Diekman X, NP as Nurse Practitioner (Internal Medicine)  Extended Emergency Contact Information Primary Emergency Contact: Culbreth,Robin Address: [address] [place], [place] [address] [place] Phone: [phone] Mobile Phone: [phone] Relation: Daughter  Code Status: DNR Goals of care: Advanced Directive information Advanced Directives 02/16/2020  Does Patient Have a Medical Advance Directive? Yes  Type of Paramedic of [place];Living will  Does patient want to make changes to medical advance directive? No - Patient declined  Copy of [place] in Chart? No - copy requested  Would patient like information on creating a medical advance directive? -  Pre-existing out of facility DNR order (yellow form or pink MOST form) -     Chief Complaint  Patient presents with  . Acute Visit    Medication review.     HPI:  Pt is a 87 y.o. female seen today for an acute visit for medication review  Hospitalized 02/17/20-02/21/20 for GI bleed/rectal bleed/diverticular bleed, placed on Protonix, EGD-possible mild candida esophagitis,  f/u GI Dr. Carlean Purl.  CBC, BMP one week  Chronic lower back, leg pain, on Flexeril 2.5mg  qhs, Lyrica 50mg  qd,              HTN, blood pressure is controlled on Metoprolol 12.5mg  bid. Bun/creat 15//0.71 02/20/20  Urinary symptoms, off  Nitrofurantoin 50mg  qd for UTI suppression, Dr. Karsten Ro.  Estrace vaginal cream for atrophic vaginitis. Anemia, dropped to 6.8 in hospital s/p 2 units of PRBC,  Vit B12, Hgb 7.7 02/20/20             GERD/chronic gastritis/atrophic gastritis  per biopsy, takes Protonix. F/u GI prn  COPD, takes Breo Ellipta  Fibromyalgia  takes Lyrica  Tactile hallucinations, off meds.   Constipation, takes MiraLax 2x/wk.     Past Medical History:  Diagnosis Date  . Acute blood loss anemia 08/23/2013   04/13/16 TSH 1.20, Na 141, K 4.2, Bun 15, creat 0.79, wbc 5.5, Hgb 11.5, plt 283  . Anxiety   . Anxiety state 07/02/2007   Qualifier: Diagnosis of  By: Lenna Gilford MD, Deborra Medina   . Asthma   . 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 Cigarette smoker   . Colon polyps 2009   TWO TUBULAR ADENOMAS AND HYPERPLASTIC POLYPS  . Constipation 09/14/2013  . COPD (chronic obstructive pulmonary disease) ([place])   . Diverticulosis of colon   . DJD (degenerative joint disease)   . Dog bite of limb 07/14/2010   Dog bite 07/10/10 - dogs shots utd Last td was 08/2009  Localized tx only.    . Fibromyalgia   . GERD 12/19/2006   Qualifier: Diagnosis of  By: Julien Girt CMA, Marliss Czar  04/13/16 TSH 1.20, Na 141, K 4.2, Bun 15, creat 0.79, wbc 5.5, Hgb 11.5, plt 283   . GERD (gastroesophageal reflux disease)   . Hammer toe of second toe of left foot 04/08/2016   Left 2nd  . Hearing loss   . Hemorrhoids   . Hypercholesterolemia   . Hypertension   . Osteoarthritis 12/19/2006   Qualifier: Diagnosis of  By: Julien Girt CMA, Marliss Czar    . Osteoporosis    Past Surgical History:  Procedure Laterality  Date  . BIOPSY  02/17/2020   Procedure: BIOPSY;  Surgeon: Lynann BolognaGupta, Rajesh, MD;  Location: [facility];  Service: Endoscopy;;  . CATARACT EXTRACTION, BILATERAL  2009  . COLONOSCOPY  2009, 2006 , 2017  . ESOPHAGOGASTRODUODENOSCOPY (EGD) WITH PROPOFOL N/A 02/17/2020   Procedure: ESOPHAGOGASTRODUODENOSCOPY (EGD) WITH PROPOFOL;  Surgeon: Lynann BolognaGupta, Rajesh, MD;  Location: [facility];  Service: Endoscopy;  Laterality: N/A;  . IR ANGIOGRAM SELECTIVE EACH ADDITIONAL VESSEL  02/17/2020  . IR ANGIOGRAM SELECTIVE EACH ADDITIONAL VESSEL  02/17/2020  . IR ANGIOGRAM SELECTIVE EACH ADDITIONAL VESSEL  02/17/2020  . IR ANGIOGRAM SELECTIVE EACH ADDITIONAL VESSEL  02/17/2020  . IR ANGIOGRAM  SELECTIVE EACH ADDITIONAL VESSEL  02/17/2020  . IR ANGIOGRAM VISCERAL SELECTIVE  02/17/2020  . IR ANGIOGRAM VISCERAL SELECTIVE  02/17/2020  . IR US GUIDE VASC ACCESS RIGHT  02/17/2020  . stapes surgery     Dr Dorma RussellKraus  . TONSILLECTOMY AND ADENOIDECTOMY     as a child    Allergies  Allergen Reactions  . Penicillins Anaphylaxis  . Bisphosphonates Other (See Comments)    [name]  . Influenza Vaccines     Pt reported  . Simvastatin     Unknown   . Trazodone And Nefazodone     Felt her throat was closing up/kept her awake  . Sulfonamide Derivatives Rash and Other (See Comments)    blisters    Allergies as of 02/21/2020      Reactions   Penicillins Anaphylaxis   Bisphosphonates Other (See Comments)   [name]   Influenza Vaccines    Pt reported   Simvastatin    Unknown    Trazodone And Nefazodone    Felt her throat was closing up/kept her awake   Sulfonamide Derivatives Rash, Other (See Comments)   blisters      Medication List       Accurate as of February 21, 2020 11:59 PM. If you have any questions, ask your nurse or doctor.        budesonide-formoterol 80-4.5 MCG/ACT inhaler Commonly known as: SYMBICORT Inhale 1 puff into the lungs 2 (two) times daily.   calcium carbonate 750 MG chewable tablet Commonly known as: TUMS EX Chew 750 mg by mouth daily.   cholecalciferol 1000 units tablet Commonly known as: VITAMIN D Take 1,000 Units by mouth daily.   cyclobenzaprine 5 MG tablet Commonly known as: FLEXERIL Take 2.5 mg by mouth at bedtime.   estradiol 0.1 MG/GM vaginal cream Commonly known as: ESTRACE Place 1 Applicatorful vaginally every Monday, Wednesday, and Friday.   ferrous sulfate 325 (65 FE) MG tablet Take 325 mg by mouth. Once A Day on Mon, Wed, Fri   hydrocortisone cream 1 % Apply 1 application topically 2 (two) times daily as needed (wrists).   metoprolol tartrate 25 MG tablet Commonly known as: LOPRESSOR Take 12.5 mg by mouth 2 (two) times  daily.   pantoprazole 40 MG tablet Commonly known as: PROTONIX Take 1 tablet (40 mg total) by mouth daily at 6 (six) AM.   polyethylene glycol 17 g packet Commonly known as: MIRALAX / GLYCOLAX Take 17 g by mouth 2 (two) times a week. Sunday and Wednesday   pregabalin 50 MG capsule Commonly known as: LYRICA Take 1 capsule (50 mg total) by mouth daily.   Tubersol 5 UNIT/0.1ML injection Generic drug: tuberculin Inject 5 Units into the skin once.   vitamin B-12 1000 MCG tablet Commonly known as: CYANOCOBALAMIN Take 1,000 mcg by mouth daily.  Review of Systems  Constitutional: Positive for appetite change. Negative for activity change and fever.       Lack of appetite  HENT: Positive for hearing loss. Negative for congestion, rhinorrhea, sore throat and voice change.   Eyes: Negative for visual disturbance.  Respiratory: Positive for cough. Negative for shortness of breath and wheezing.   Cardiovascular: Negative for leg swelling.  Gastrointestinal: Negative for abdominal pain, constipation, nausea and vomiting.  Genitourinary: Negative for dysuria and urgency.  Musculoskeletal: Positive for arthralgias, back pain, gait problem and myalgias.  Skin: Positive for pallor. Negative for color change.  Neurological: Negative for speech difficulty, weakness, light-headedness and headaches.  Psychiatric/Behavioral: Positive for sleep disturbance. Negative for behavioral problems and hallucinations. The patient is not nervous/anxious.        Chronic going to bed late, sleeping in late    Immunization History  Administered Date(s) Administered  . Moderna Sars-Covid-2 Vaccination 02/17/2019, 03/17/2019  . Pneumococcal Conjugate-13 09/01/2015  . Pneumococcal Polysaccharide-23 07/07/1998  . Td 08/21/2009   Pertinent  Health Maintenance Due  Topic Date Due  . PNA vac Low Risk Adult (2 of 2 - PPSV23) 08/31/2016  . DEXA SCAN  Completed  . INFLUENZA VACCINE  Discontinued   Fall  Risk  11/08/2017 11/05/2016 09/01/2015 08/31/2012  Falls in the past year? No Yes Yes No  Number falls in past yr: - 2 or more 1 -  Injury with Fall? - Yes No -  Risk for fall due to : - - Impaired balance/gait -   Functional Status Survey:    Vitals:   02/21/20 1633  BP: (!) 165/85  Pulse: 62  Resp: 18  Temp: 98.7 F (37.1 C)  SpO2: 94%  Weight: 134 lb (60.8 kg)  Height: 5\' 1"  (1.549 m)   Body mass index is 25.32 kg/m. Physical Exam Vitals and nursing note reviewed.  Constitutional:      Appearance: Normal appearance.  HENT:     Head: Normocephalic and atraumatic.     Nose: Nose normal.     Mouth/Throat:     Mouth: Mucous membranes are moist.  Eyes:     Extraocular Movements: Extraocular movements intact.     Conjunctiva/sclera: Conjunctivae normal.     Pupils: Pupils are equal, round, and reactive to light.  Cardiovascular:     Rate and Rhythm: Normal rate and regular rhythm.     Heart sounds: No murmur heard.   Pulmonary:     Effort: Pulmonary effort is normal.     Breath sounds: No wheezing or rales.     Comments: Decreased air entry to both lungs.  Abdominal:     General: Bowel sounds are normal.     Palpations: Abdomen is soft.     Tenderness: There is no abdominal tenderness. There is no right CVA tenderness, left CVA tenderness, guarding or rebound.  Musculoskeletal:     Cervical back: Normal range of motion and neck supple.     Right lower leg: No edema.     Left lower leg: No edema.  Skin:    General: Skin is warm and dry.  Neurological:     General: No focal deficit present.     Mental Status: She is alert. Mental status is at baseline.     Motor: No weakness.     Coordination: Coordination normal.     Gait: Gait abnormal.     Comments: Oriented to person, place.   Psychiatric:        Mood  and Affect: Mood normal.        Behavior: Behavior normal.     Labs reviewed: Recent Labs    02/18/20 0115 02/19/20 0254 02/20/20 0909  NA 136 139 140   K 3.7 3.4* 3.4*  CL 108 108 108  CO2 21* 23 24  GLUCOSE 122* 125* 104*  BUN 16 19 15   CREATININE 0.58 0.65 0.71  CALCIUM 7.8* 8.5* 8.2*   Recent Labs    10/02/19 0000 12/25/19 0000 02/16/20 0408  AST 15 13 18   ALT 10 7 15   ALKPHOS 71 81 72  BILITOT  --   --  0.6  PROT  --   --  6.0*  ALBUMIN 3.7 3.6 3.8   Recent Labs    04/12/19 0000 10/02/19 0000 02/16/20 0408 02/18/20 0115 02/19/20 0254 02/20/20 0909  WBC 5.2 4.6   < > 9.8 7.5 6.1  NEUTROABS 2,647 2,157  --   --   --   --   HGB 10.9* 10.9*   < > 7.7* 6.8* 7.7*  HCT 33* 32*   < > 23.2* 20.7* 23.1*  MCV  --   --    < > 92.4 92.8 87.8  PLT 213 182   < > 165 156 158   < > = values in this interval not displayed.   Lab Results  Component Value Date   TSH 1.92 01/10/2019   Lab Results  Component Value Date   HGBA1C 5.8 02/17/2017   Lab Results  Component Value Date   CHOL 177 01/10/2019   HDL 52 01/10/2019   LDLCALC 104 01/10/2019   LDLDIRECT 114.7 08/18/2011   TRIG 110 01/10/2019   CHOLHDL 2 01/23/2015    Significant Diagnostic Results in last 30 days:  IR Angiogram Visceral Selective  Result Date: 02/18/2020 INDICATION: 87 year old female with acute sigmoid diverticular bleed. She presents for arteriogram and possible embolization. EXAM: SELECTIVE VISCERAL ARTERIOGRAPHY; IR ULTRASOUND GUIDANCE VASC ACCESS RIGHT; ADDITIONAL ARTERIOGRAPHY MEDICATIONS: None. ANESTHESIA/SEDATION: 1 mg Versed administered for anxiolysis. CONTRAST:  58mL OMNIPAQUE IOHEXOL 300 MG/ML SOLN, 1102mL OMNIPAQUE IOHEXOL 300 MG/ML SOLN, 34mL OMNIPAQUE IOHEXOL 300 MG/ML SOLN, 41mL OMNIPAQUE IOHEXOL 300 MG/ML SOLN FLUOROSCOPY TIME:  Fluoroscopy Time: 25 minutes 12 seconds (1901 mGy). COMPLICATIONS: None immediate. PROCEDURE: Informed consent was obtained from the patient following explanation of the procedure, risks, benefits and alternatives. The patient understands, agrees and consents for the procedure. All questions were addressed. A time  out was performed prior to the initiation of the procedure. Maximal barrier sterile technique utilized including caps, mask, sterile gowns, sterile gloves, large sterile drape, hand hygiene, and Betadine prep. The right common femoral artery was interrogated with ultrasound and found to be widely patent. An image was obtained and stored for the medical record. Local anesthesia was attained by infiltration with 1% lidocaine. A small dermatotomy was made. Under real-time sonographic guidance, the vessel was punctured with a 21 gauge micropuncture needle. Using standard technique, the initial micro needle was exchanged over a 0.018 micro wire for a transitional 4 [place] micro sheath. The micro sheath was then exchanged over a 0.035 wire for a 5 French vascular sheath. Initially, a RIM catheter was introduced into the abdominal aorta over a Bentson wire. The aorta is tortuous and variant in caliber with portions that are ectatic, and portions that are small. Heavily calcified atherosclerotic plaque noted throughout. The origin of the inferior mesenteric artery could not be engaged with this catheter. Therefore, the catheter was exchanged for a [employer]  catheter. The Mickelson catheter was formed. Again, the origin of the IMA could not be successfully engaged. The Mickelson catheter was then exchanged for a Sos Omni selective catheter. Despite multiple attempts, the origin of the IMA could not be successfully identified through the heavily calcified plaque. At this point, the decision was made to pursue angiography of the superior mesenteric artery to see if there is a communication into the IMA territory via the middle colic artery. A C2 cobra catheter was advanced over the Bentson wire and used to select the superior mesenteric artery. An angiogram was performed. No obvious communication with the IMA. No evidence of active bleeding. A renegade STC microcatheter was advanced over a Fathom 16 wire and used to select a  proximal branch arising from the IMA. Arteriography was performed. This is a jejunal branch with normal opacification of the jejunal mucosa. No evidence of active bleeding. No communication with the IMA territory. The microcatheter was used to select another branch artery. Contrast injection demonstrates filling of the redundant transverse colon. This appears to be the left middle colic artery colic artery. No evidence of active bleeding. The microcatheter was advanced further into the unnamed branch supplying the distal transverse colon. Super selective arteriography was performed. Again, no evidence of active bleeding. No communication with the main IMA territory. At this time, the decision was made to go back to attempting to catheterize the inferior mesenteric artery as this would provide the arterial supply to the region of active bleeding seen on the recent CT arteriogram. The Sos microcatheter was reintroduced. Additional arteriography was performed in multiple obliquities in till the origin of the IMA was successfully identified. Due to stenosis at the origin, the catheter tip could not be engaged in the artery. However, the catheter was positioned just outside the arterial ostium and ultimately, the renegade STC microcatheter and Fathom wire were successfully floated into the inferior mesenteric artery. Arteriography was then performed. No evidence of active bleeding on initial angiography. Due to the heavily stenotic origin, opacification of the arteries is somewhat limited. Therefore, the microcatheter was advanced more distally into a sigmoid branch of the IMA. Contrast injection was performed and demonstrates no evidence of active bleeding. The microcatheter was next advanced into the left colic artery. Contrast injection was performed. Again, no evidence of active bleeding. The patient's diverticular bleed appears to have spontaneously stopped. Catheters were removed. Hemostasis was attained with the  aid of a Celt closure device. IMPRESSION: 1. Heavily diseased and calcified arteries. Catheterization of the inferior mesenteric artery was extremely challenging but ultimately successful. 2. No evidence of active bleeding at the time of arteriography. Signed, Criselda Peaches, MD, [facility] Radiology Electronically Signed   By: Jacqulynn Cadet M.D.   On: 02/18/2020 08:29   IR Angiogram Visceral Selective  Result Date: 02/18/2020 INDICATION: 87 year old female with acute sigmoid diverticular bleed. She presents for arteriogram and possible embolization. EXAM: SELECTIVE VISCERAL ARTERIOGRAPHY; IR ULTRASOUND GUIDANCE VASC ACCESS RIGHT; ADDITIONAL ARTERIOGRAPHY MEDICATIONS: None. ANESTHESIA/SEDATION: 1 mg Versed administered for anxiolysis. CONTRAST:  58mL OMNIPAQUE IOHEXOL 300 MG/ML SOLN, 18mL OMNIPAQUE IOHEXOL 300 MG/ML SOLN, 66mL OMNIPAQUE IOHEXOL 300 MG/ML SOLN, 44mL OMNIPAQUE IOHEXOL 300 MG/ML SOLN FLUOROSCOPY TIME:  Fluoroscopy Time: 25 minutes 12 seconds (1901 mGy). COMPLICATIONS: None immediate. PROCEDURE: Informed consent was obtained from the patient following explanation of the procedure, risks, benefits and alternatives. The patient understands, agrees and consents for the procedure. All questions were addressed. A time out was performed  prior to the initiation of the procedure. Maximal barrier sterile technique utilized including caps, mask, sterile gowns, sterile gloves, large sterile drape, hand hygiene, and Betadine prep. The right common femoral artery was interrogated with ultrasound and found to be widely patent. An image was obtained and stored for the medical record. Local anesthesia was attained by infiltration with 1% lidocaine. A small dermatotomy was made. Under real-time sonographic guidance, the vessel was punctured with a 21 gauge micropuncture needle. Using standard technique, the initial micro needle was exchanged over a  0.018 micro wire for a transitional 4 [place] micro sheath. The micro sheath was then exchanged over a 0.035 wire for a 5 French vascular sheath. Initially, a RIM catheter was introduced into the abdominal aorta over a Bentson wire. The aorta is tortuous and variant in caliber with portions that are ectatic, and portions that are small. Heavily calcified atherosclerotic plaque noted throughout. The origin of the inferior mesenteric artery could not be engaged with this catheter. Therefore, the catheter was exchanged for a Mickelson catheter. The Mickelson catheter was formed. Again, the origin of the IMA could not be successfully engaged. The Mickelson catheter was then exchanged for a Sos Omni selective catheter. Despite multiple attempts, the origin of the IMA could not be successfully identified through the heavily calcified plaque. At this point, the decision was made to pursue angiography of the superior mesenteric artery to see if there is a communication into the IMA territory via the middle colic artery. A C2 cobra catheter was advanced over the Bentson wire and used to select the superior mesenteric artery. An angiogram was performed. No obvious communication with the IMA. No evidence of active bleeding. A renegade STC microcatheter was advanced over a Fathom 16 wire and used to select a proximal branch arising from the IMA. Arteriography was performed. This is a jejunal branch with normal opacification of the jejunal mucosa. No evidence of active bleeding. No communication with the IMA territory. The microcatheter was used to select another branch artery. Contrast injection demonstrates filling of the redundant transverse colon. This appears to be the left middle colic artery colic artery. No evidence of active bleeding. The microcatheter was advanced further into the unnamed branch supplying the distal transverse colon. Super selective arteriography was performed. Again, no evidence of active bleeding. No  communication with the main IMA territory. At this time, the decision was made to go back to attempting to catheterize the inferior mesenteric artery as this would provide the arterial supply to the region of active bleeding seen on the recent CT arteriogram. The Sos microcatheter was reintroduced. Additional arteriography was performed in multiple obliquities in till the origin of the IMA was successfully identified. Due to stenosis at the origin, the catheter tip could not be engaged in the artery. However, the catheter was positioned just outside the arterial ostium and ultimately, the renegade STC microcatheter and Fathom wire were successfully floated into the inferior mesenteric artery. Arteriography was then performed. No evidence of active bleeding on initial angiography. Due to the heavily stenotic origin, opacification of the arteries is somewhat limited. Therefore, the microcatheter was advanced more distally into a sigmoid branch of the IMA. Contrast injection was performed and demonstrates no evidence of active bleeding. The microcatheter was next advanced into the left colic artery. Contrast injection was performed. Again, no evidence of active bleeding. The patient's diverticular bleed appears to have spontaneously stopped. Catheters were removed. Hemostasis was attained with the aid of a Celt closure device. IMPRESSION:  1. Heavily diseased and calcified arteries. Catheterization of the inferior mesenteric artery was extremely challenging but ultimately successful. 2. No evidence of active bleeding at the time of arteriography. Signed, Criselda Peaches, MD, [facility] Radiology Electronically Signed   By: Jacqulynn Cadet M.D.   On: 02/18/2020 08:29   IR Angiogram Selective Each Additional Vessel  Result Date: 02/18/2020 INDICATION: 87 year old female with acute sigmoid diverticular bleed. She presents for arteriogram and possible  embolization. EXAM: SELECTIVE VISCERAL ARTERIOGRAPHY; IR ULTRASOUND GUIDANCE VASC ACCESS RIGHT; ADDITIONAL ARTERIOGRAPHY MEDICATIONS: None. ANESTHESIA/SEDATION: 1 mg Versed administered for anxiolysis. CONTRAST:  51mL OMNIPAQUE IOHEXOL 300 MG/ML SOLN, 181mL OMNIPAQUE IOHEXOL 300 MG/ML SOLN, 58mL OMNIPAQUE IOHEXOL 300 MG/ML SOLN, 28mL OMNIPAQUE IOHEXOL 300 MG/ML SOLN FLUOROSCOPY TIME:  Fluoroscopy Time: 25 minutes 12 seconds (1901 mGy). COMPLICATIONS: None immediate. PROCEDURE: Informed consent was obtained from the patient following explanation of the procedure, risks, benefits and alternatives. The patient understands, agrees and consents for the procedure. All questions were addressed. A time out was performed prior to the initiation of the procedure. Maximal barrier sterile technique utilized including caps, mask, sterile gowns, sterile gloves, large sterile drape, hand hygiene, and Betadine prep. The right common femoral artery was interrogated with ultrasound and found to be widely patent. An image was obtained and stored for the medical record. Local anesthesia was attained by infiltration with 1% lidocaine. A small dermatotomy was made. Under real-time sonographic guidance, the vessel was punctured with a 21 gauge micropuncture needle. Using standard technique, the initial micro needle was exchanged over a 0.018 micro wire for a transitional 4 [place] micro sheath. The micro sheath was then exchanged over a 0.035 wire for a 5 French vascular sheath. Initially, a RIM catheter was introduced into the abdominal aorta over a Bentson wire. The aorta is tortuous and variant in caliber with portions that are ectatic, and portions that are small. Heavily calcified atherosclerotic plaque noted throughout. The origin of the inferior mesenteric artery could not be engaged with this catheter. Therefore, the catheter was exchanged for a Mickelson catheter. The Mickelson catheter was formed. Again, the origin of the IMA  could not be successfully engaged. The Mickelson catheter was then exchanged for a Sos Omni selective catheter. Despite multiple attempts, the origin of the IMA could not be successfully identified through the heavily calcified plaque. At this point, the decision was made to pursue angiography of the superior mesenteric artery to see if there is a communication into the IMA territory via the middle colic artery. A C2 cobra catheter was advanced over the Bentson wire and used to select the superior mesenteric artery. An angiogram was performed. No obvious communication with the IMA. No evidence of active bleeding. A renegade STC microcatheter was advanced over a Fathom 16 wire and used to select a proximal branch arising from the IMA. Arteriography was performed. This is a jejunal branch with normal opacification of the jejunal mucosa. No evidence of active bleeding. No communication with the IMA territory. The microcatheter was used to select another branch artery. Contrast injection demonstrates filling of the redundant transverse colon. This appears to be the left middle colic artery colic artery. No evidence of active bleeding. The microcatheter was advanced further into the unnamed branch supplying the distal transverse colon. Super selective arteriography was performed. Again, no evidence of active bleeding. No communication with the main IMA territory. At this time, the decision was made to go back to attempting to catheterize the inferior mesenteric  artery as this would provide the arterial supply to the region of active bleeding seen on the recent CT arteriogram. The Sos microcatheter was reintroduced. Additional arteriography was performed in multiple obliquities in till the origin of the IMA was successfully identified. Due to stenosis at the origin, the catheter tip could not be engaged in the artery. However, the catheter was positioned just outside the arterial ostium and ultimately, the renegade STC  microcatheter and Fathom wire were successfully floated into the inferior mesenteric artery. Arteriography was then performed. No evidence of active bleeding on initial angiography. Due to the heavily stenotic origin, opacification of the arteries is somewhat limited. Therefore, the microcatheter was advanced more distally into a sigmoid branch of the IMA. Contrast injection was performed and demonstrates no evidence of active bleeding. The microcatheter was next advanced into the left colic artery. Contrast injection was performed. Again, no evidence of active bleeding. The patient's diverticular bleed appears to have spontaneously stopped. Catheters were removed. Hemostasis was attained with the aid of a Celt closure device. IMPRESSION: 1. Heavily diseased and calcified arteries. Catheterization of the inferior mesenteric artery was extremely challenging but ultimately successful. 2. No evidence of active bleeding at the time of arteriography. Signed, Criselda Peaches, MD, [place] [facility] Radiology Electronically Signed   By: Jacqulynn Cadet M.D.   On: 02/18/2020 08:29   IR Angiogram Selective Each Additional Vessel  Result Date: 02/18/2020 INDICATION: 87 year old female with acute sigmoid diverticular bleed. She presents for arteriogram and possible embolization. EXAM: SELECTIVE VISCERAL ARTERIOGRAPHY; IR ULTRASOUND GUIDANCE VASC ACCESS RIGHT; ADDITIONAL ARTERIOGRAPHY MEDICATIONS: None. ANESTHESIA/SEDATION: 1 mg Versed administered for anxiolysis. CONTRAST:  57mL OMNIPAQUE IOHEXOL 300 MG/ML SOLN, 110mL OMNIPAQUE IOHEXOL 300 MG/ML SOLN, 57mL OMNIPAQUE IOHEXOL 300 MG/ML SOLN, 83mL OMNIPAQUE IOHEXOL 300 MG/ML SOLN FLUOROSCOPY TIME:  Fluoroscopy Time: 25 minutes 12 seconds (1901 mGy). COMPLICATIONS: None immediate. PROCEDURE: Informed consent was obtained from the patient following explanation of the procedure, risks, benefits and alternatives. The patient  understands, agrees and consents for the procedure. All questions were addressed. A time out was performed prior to the initiation of the procedure. Maximal barrier sterile technique utilized including caps, mask, sterile gowns, sterile gloves, large sterile drape, hand hygiene, and Betadine prep. The right common femoral artery was interrogated with ultrasound and found to be widely patent. An image was obtained and stored for the medical record. Local anesthesia was attained by infiltration with 1% lidocaine. A small dermatotomy was made. Under real-time sonographic guidance, the vessel was punctured with a 21 gauge micropuncture needle. Using standard technique, the initial micro needle was exchanged over a 0.018 micro wire for a transitional 4 [place] micro sheath. The micro sheath was then exchanged over a 0.035 wire for a 5 French vascular sheath. Initially, a RIM catheter was introduced into the abdominal aorta over a Bentson wire. The aorta is tortuous and variant in caliber with portions that are ectatic, and portions that are small. Heavily calcified atherosclerotic plaque noted throughout. The origin of the inferior mesenteric artery could not be engaged with this catheter. Therefore, the catheter was exchanged for a Mickelson catheter. The Mickelson catheter was formed. Again, the origin of the IMA could not be successfully engaged. The Mickelson catheter was then exchanged for a Sos Omni selective catheter. Despite multiple attempts, the origin of the IMA could not be successfully identified through the heavily calcified plaque. At this point, the decision was made to pursue angiography of the superior mesenteric artery to  see if there is a communication into the IMA territory via the middle colic artery. A C2 cobra catheter was advanced over the Bentson wire and used to select the superior mesenteric artery. An angiogram was performed. No obvious communication with the IMA. No evidence of active  bleeding. A renegade STC microcatheter was advanced over a Fathom 16 wire and used to select a proximal branch arising from the IMA. Arteriography was performed. This is a jejunal branch with normal opacification of the jejunal mucosa. No evidence of active bleeding. No communication with the IMA territory. The microcatheter was used to select another branch artery. Contrast injection demonstrates filling of the redundant transverse colon. This appears to be the left middle colic artery colic artery. No evidence of active bleeding. The microcatheter was advanced further into the unnamed branch supplying the distal transverse colon. Super selective arteriography was performed. Again, no evidence of active bleeding. No communication with the main IMA territory. At this time, the decision was made to go back to attempting to catheterize the inferior mesenteric artery as this would provide the arterial supply to the region of active bleeding seen on the recent CT arteriogram. The Sos microcatheter was reintroduced. Additional arteriography was performed in multiple obliquities in till the origin of the IMA was successfully identified. Due to stenosis at the origin, the catheter tip could not be engaged in the artery. However, the catheter was positioned just outside the arterial ostium and ultimately, the renegade STC microcatheter and Fathom wire were successfully floated into the inferior mesenteric artery. Arteriography was then performed. No evidence of active bleeding on initial angiography. Due to the heavily stenotic origin, opacification of the arteries is somewhat limited. Therefore, the microcatheter was advanced more distally into a sigmoid branch of the IMA. Contrast injection was performed and demonstrates no evidence of active bleeding. The microcatheter was next advanced into the left colic artery. Contrast injection was performed. Again, no evidence of active bleeding. The patient's diverticular bleed  appears to have spontaneously stopped. Catheters were removed. Hemostasis was attained with the aid of a Celt closure device. IMPRESSION: 1. Heavily diseased and calcified arteries. Catheterization of the inferior mesenteric artery was extremely challenging but ultimately successful. 2. No evidence of active bleeding at the time of arteriography. Signed, Criselda Peaches, MD, [place] [facility] Radiology Electronically Signed   By: Jacqulynn Cadet M.D.   On: 02/18/2020 08:29   IR Angiogram Selective Each Additional Vessel  Result Date: 02/18/2020 INDICATION: 87 year old female with acute sigmoid diverticular bleed. She presents for arteriogram and possible embolization. EXAM: SELECTIVE VISCERAL ARTERIOGRAPHY; IR ULTRASOUND GUIDANCE VASC ACCESS RIGHT; ADDITIONAL ARTERIOGRAPHY MEDICATIONS: None. ANESTHESIA/SEDATION: 1 mg Versed administered for anxiolysis. CONTRAST:  57mL OMNIPAQUE IOHEXOL 300 MG/ML SOLN, 138mL OMNIPAQUE IOHEXOL 300 MG/ML SOLN, 14mL OMNIPAQUE IOHEXOL 300 MG/ML SOLN, 39mL OMNIPAQUE IOHEXOL 300 MG/ML SOLN FLUOROSCOPY TIME:  Fluoroscopy Time: 25 minutes 12 seconds (1901 mGy). COMPLICATIONS: None immediate. PROCEDURE: Informed consent was obtained from the patient following explanation of the procedure, risks, benefits and alternatives. The patient understands, agrees and consents for the procedure. All questions were addressed. A time out was performed prior to the initiation of the procedure. Maximal barrier sterile technique utilized including caps, mask, sterile gowns, sterile gloves, large sterile drape, hand hygiene, and Betadine prep. The right common femoral artery was interrogated with ultrasound and found to be widely patent. An image was obtained and stored for the medical record. Local anesthesia was attained by infiltration with 1% lidocaine.  A small dermatotomy was made. Under real-time sonographic guidance, the vessel was punctured with  a 21 gauge micropuncture needle. Using standard technique, the initial micro needle was exchanged over a 0.018 micro wire for a transitional 4 [place] micro sheath. The micro sheath was then exchanged over a 0.035 wire for a 5 French vascular sheath. Initially, a RIM catheter was introduced into the abdominal aorta over a Bentson wire. The aorta is tortuous and variant in caliber with portions that are ectatic, and portions that are small. Heavily calcified atherosclerotic plaque noted throughout. The origin of the inferior mesenteric artery could not be engaged with this catheter. Therefore, the catheter was exchanged for a Mickelson catheter. The Mickelson catheter was formed. Again, the origin of the IMA could not be successfully engaged. The Mickelson catheter was then exchanged for a Sos Omni selective catheter. Despite multiple attempts, the origin of the IMA could not be successfully identified through the heavily calcified plaque. At this point, the decision was made to pursue angiography of the superior mesenteric artery to see if there is a communication into the IMA territory via the middle colic artery. A C2 cobra catheter was advanced over the Bentson wire and used to select the superior mesenteric artery. An angiogram was performed. No obvious communication with the IMA. No evidence of active bleeding. A renegade STC microcatheter was advanced over a Fathom 16 wire and used to select a proximal branch arising from the IMA. Arteriography was performed. This is a jejunal branch with normal opacification of the jejunal mucosa. No evidence of active bleeding. No communication with the IMA territory. The microcatheter was used to select another branch artery. Contrast injection demonstrates filling of the redundant transverse colon. This appears to be the left middle colic artery colic artery. No evidence of active bleeding. The microcatheter was advanced further into the unnamed branch supplying the distal  transverse colon. Super selective arteriography was performed. Again, no evidence of active bleeding. No communication with the main IMA territory. At this time, the decision was made to go back to attempting to catheterize the inferior mesenteric artery as this would provide the arterial supply to the region of active bleeding seen on the recent CT arteriogram. The Sos microcatheter was reintroduced. Additional arteriography was performed in multiple obliquities in till the origin of the IMA was successfully identified. Due to stenosis at the origin, the catheter tip could not be engaged in the artery. However, the catheter was positioned just outside the arterial ostium and ultimately, the renegade STC microcatheter and Fathom wire were successfully floated into the inferior mesenteric artery. Arteriography was then performed. No evidence of active bleeding on initial angiography. Due to the heavily stenotic origin, opacification of the arteries is somewhat limited. Therefore, the microcatheter was advanced more distally into a sigmoid branch of the IMA. Contrast injection was performed and demonstrates no evidence of active bleeding. The microcatheter was next advanced into the left colic artery. Contrast injection was performed. Again, no evidence of active bleeding. The patient's diverticular bleed appears to have spontaneously stopped. Catheters were removed. Hemostasis was attained with the aid of a Celt closure device. IMPRESSION: 1. Heavily diseased and calcified arteries. Catheterization of the inferior mesenteric artery was extremely challenging but ultimately successful. 2. No evidence of active bleeding at the time of arteriography. Signed, Sterling BigHeath K. McCullough, MD, [facility] Electronically Signed   By: Malachy MoanHeath  McCullough M.D.   On: 02/18/2020 08:29   IR Angiogram Selective  Each Additional Vessel  Result Date: 02/18/2020 INDICATION:  87 year old female with acute sigmoid diverticular bleed. She presents for arteriogram and possible embolization. EXAM: SELECTIVE VISCERAL ARTERIOGRAPHY; IR ULTRASOUND GUIDANCE VASC ACCESS RIGHT; ADDITIONAL ARTERIOGRAPHY MEDICATIONS: None. ANESTHESIA/SEDATION: 1 mg Versed administered for anxiolysis. CONTRAST:  46mL OMNIPAQUE IOHEXOL 300 MG/ML SOLN, 120mL OMNIPAQUE IOHEXOL 300 MG/ML SOLN, 71mL OMNIPAQUE IOHEXOL 300 MG/ML SOLN, 18mL OMNIPAQUE IOHEXOL 300 MG/ML SOLN FLUOROSCOPY TIME:  Fluoroscopy Time: 25 minutes 12 seconds (1901 mGy). COMPLICATIONS: None immediate. PROCEDURE: Informed consent was obtained from the patient following explanation of the procedure, risks, benefits and alternatives. The patient understands, agrees and consents for the procedure. All questions were addressed. A time out was performed prior to the initiation of the procedure. Maximal barrier sterile technique utilized including caps, mask, sterile gowns, sterile gloves, large sterile drape, hand hygiene, and Betadine prep. The right common femoral artery was interrogated with ultrasound and found to be widely patent. An image was obtained and stored for the medical record. Local anesthesia was attained by infiltration with 1% lidocaine. A small dermatotomy was made. Under real-time sonographic guidance, the vessel was punctured with a 21 gauge micropuncture needle. Using standard technique, the initial micro needle was exchanged over a 0.018 micro wire for a transitional 4 [place] micro sheath. The micro sheath was then exchanged over a 0.035 wire for a 5 French vascular sheath. Initially, a RIM catheter was introduced into the abdominal aorta over a Bentson wire. The aorta is tortuous and variant in caliber with portions that are ectatic, and portions that are small. Heavily calcified atherosclerotic plaque noted throughout. The origin of the inferior mesenteric artery could not be engaged with this catheter. Therefore, the catheter was  exchanged for a Mickelson catheter. The Mickelson catheter was formed. Again, the origin of the IMA could not be successfully engaged. The Mickelson catheter was then exchanged for a Sos Omni selective catheter. Despite multiple attempts, the origin of the IMA could not be successfully identified through the heavily calcified plaque. At this point, the decision was made to pursue angiography of the superior mesenteric artery to see if there is a communication into the IMA territory via the middle colic artery. A C2 cobra catheter was advanced over the Bentson wire and used to select the superior mesenteric artery. An angiogram was performed. No obvious communication with the IMA. No evidence of active bleeding. A renegade STC microcatheter was advanced over a Fathom 16 wire and used to select a proximal branch arising from the IMA. Arteriography was performed. This is a jejunal branch with normal opacification of the jejunal mucosa. No evidence of active bleeding. No communication with the IMA territory. The microcatheter was used to select another branch artery. Contrast injection demonstrates filling of the redundant transverse colon. This appears to be the left middle colic artery colic artery. No evidence of active bleeding. The microcatheter was advanced further into the unnamed branch supplying the distal transverse colon. Super selective arteriography was performed. Again, no evidence of active bleeding. No communication with the main IMA territory. At this time, the decision was made to go back to attempting to catheterize the inferior mesenteric artery as this would provide the arterial supply to the region of active bleeding seen on the recent CT arteriogram. The Sos microcatheter was reintroduced. Additional arteriography was performed in multiple obliquities in till the origin of the IMA was successfully identified. Due to stenosis at the origin, the catheter tip could not be engaged in the artery.  However, the  catheter was positioned just outside the arterial ostium and ultimately, the renegade STC microcatheter and Fathom wire were successfully floated into the inferior mesenteric artery. Arteriography was then performed. No evidence of active bleeding on initial angiography. Due to the heavily stenotic origin, opacification of the arteries is somewhat limited. Therefore, the microcatheter was advanced more distally into a sigmoid branch of the IMA. Contrast injection was performed and demonstrates no evidence of active bleeding. The microcatheter was next advanced into the left colic artery. Contrast injection was performed. Again, no evidence of active bleeding. The patient's diverticular bleed appears to have spontaneously stopped. Catheters were removed. Hemostasis was attained with the aid of a Celt closure device. IMPRESSION: 1. Heavily diseased and calcified arteries. Catheterization of the inferior mesenteric artery was extremely challenging but ultimately successful. 2. No evidence of active bleeding at the time of arteriography. Signed, Criselda Peaches, MD, [place] [facility] Radiology Electronically Signed   By: Jacqulynn Cadet M.D.   On: 02/18/2020 08:29   IR Angiogram Selective Each Additional Vessel  Result Date: 02/18/2020 INDICATION: 87 year old female with acute sigmoid diverticular bleed. She presents for arteriogram and possible embolization. EXAM: SELECTIVE VISCERAL ARTERIOGRAPHY; IR ULTRASOUND GUIDANCE VASC ACCESS RIGHT; ADDITIONAL ARTERIOGRAPHY MEDICATIONS: None. ANESTHESIA/SEDATION: 1 mg Versed administered for anxiolysis. CONTRAST:  11mL OMNIPAQUE IOHEXOL 300 MG/ML SOLN, 138mL OMNIPAQUE IOHEXOL 300 MG/ML SOLN, 32mL OMNIPAQUE IOHEXOL 300 MG/ML SOLN, 67mL OMNIPAQUE IOHEXOL 300 MG/ML SOLN FLUOROSCOPY TIME:  Fluoroscopy Time: 25 minutes 12 seconds (1901 mGy). COMPLICATIONS: None immediate. PROCEDURE: Informed consent was obtained  from the patient following explanation of the procedure, risks, benefits and alternatives. The patient understands, agrees and consents for the procedure. All questions were addressed. A time out was performed prior to the initiation of the procedure. Maximal barrier sterile technique utilized including caps, mask, sterile gowns, sterile gloves, large sterile drape, hand hygiene, and Betadine prep. The right common femoral artery was interrogated with ultrasound and found to be widely patent. An image was obtained and stored for the medical record. Local anesthesia was attained by infiltration with 1% lidocaine. A small dermatotomy was made. Under real-time sonographic guidance, the vessel was punctured with a 21 gauge micropuncture needle. Using standard technique, the initial micro needle was exchanged over a 0.018 micro wire for a transitional 4 [place] micro sheath. The micro sheath was then exchanged over a 0.035 wire for a 5 French vascular sheath. Initially, a RIM catheter was introduced into the abdominal aorta over a Bentson wire. The aorta is tortuous and variant in caliber with portions that are ectatic, and portions that are small. Heavily calcified atherosclerotic plaque noted throughout. The origin of the inferior mesenteric artery could not be engaged with this catheter. Therefore, the catheter was exchanged for a Mickelson catheter. The Mickelson catheter was formed. Again, the origin of the IMA could not be successfully engaged. The Mickelson catheter was then exchanged for a Sos Omni selective catheter. Despite multiple attempts, the origin of the IMA could not be successfully identified through the heavily calcified plaque. At this point, the decision was made to pursue angiography of the superior mesenteric artery to see if there is a communication into the IMA territory via the middle colic artery. A C2 cobra catheter was advanced over the Bentson wire and used to select the superior mesenteric  artery. An angiogram was performed. No obvious communication with the IMA. No evidence of active bleeding. A renegade STC microcatheter was advanced over a Fathom 16 wire and  used to select a proximal branch arising from the IMA. Arteriography was performed. This is a jejunal branch with normal opacification of the jejunal mucosa. No evidence of active bleeding. No communication with the IMA territory. The microcatheter was used to select another branch artery. Contrast injection demonstrates filling of the redundant transverse colon. This appears to be the left middle colic artery colic artery. No evidence of active bleeding. The microcatheter was advanced further into the unnamed branch supplying the distal transverse colon. Super selective arteriography was performed. Again, no evidence of active bleeding. No communication with the main IMA territory. At this time, the decision was made to go back to attempting to catheterize the inferior mesenteric artery as this would provide the arterial supply to the region of active bleeding seen on the recent CT arteriogram. The Sos microcatheter was reintroduced. Additional arteriography was performed in multiple obliquities in till the origin of the IMA was successfully identified. Due to stenosis at the origin, the catheter tip could not be engaged in the artery. However, the catheter was positioned just outside the arterial ostium and ultimately, the renegade STC microcatheter and Fathom wire were successfully floated into the inferior mesenteric artery. Arteriography was then performed. No evidence of active bleeding on initial angiography. Due to the heavily stenotic origin, opacification of the arteries is somewhat limited. Therefore, the microcatheter was advanced more distally into a sigmoid branch of the IMA. Contrast injection was performed and demonstrates no evidence of active bleeding. The microcatheter was next advanced into the left colic artery. Contrast  injection was performed. Again, no evidence of active bleeding. The patient's diverticular bleed appears to have spontaneously stopped. Catheters were removed. Hemostasis was attained with the aid of a Celt closure device. IMPRESSION: 1. Heavily diseased and calcified arteries. Catheterization of the inferior mesenteric artery was extremely challenging but ultimately successful. 2. No evidence of active bleeding at the time of arteriography. Signed, Criselda Peaches, MD, [place] [facility] Radiology Electronically Signed   By: Jacqulynn Cadet M.D.   On: 02/18/2020 08:29   IR US Guide Vasc Access Right  Result Date: 02/18/2020 INDICATION: 87 year old female with acute sigmoid diverticular bleed. She presents for arteriogram and possible embolization. EXAM: SELECTIVE VISCERAL ARTERIOGRAPHY; IR ULTRASOUND GUIDANCE VASC ACCESS RIGHT; ADDITIONAL ARTERIOGRAPHY MEDICATIONS: None. ANESTHESIA/SEDATION: 1 mg Versed administered for anxiolysis. CONTRAST:  65mL OMNIPAQUE IOHEXOL 300 MG/ML SOLN, 121mL OMNIPAQUE IOHEXOL 300 MG/ML SOLN, 80mL OMNIPAQUE IOHEXOL 300 MG/ML SOLN, 72mL OMNIPAQUE IOHEXOL 300 MG/ML SOLN FLUOROSCOPY TIME:  Fluoroscopy Time: 25 minutes 12 seconds (1901 mGy). COMPLICATIONS: None immediate. PROCEDURE: Informed consent was obtained from the patient following explanation of the procedure, risks, benefits and alternatives. The patient understands, agrees and consents for the procedure. All questions were addressed. A time out was performed prior to the initiation of the procedure. Maximal barrier sterile technique utilized including caps, mask, sterile gowns, sterile gloves, large sterile drape, hand hygiene, and Betadine prep. The right common femoral artery was interrogated with ultrasound and found to be widely patent. An image was obtained and stored for the medical record. Local anesthesia was attained by infiltration with 1% lidocaine. A small  dermatotomy was made. Under real-time sonographic guidance, the vessel was punctured with a 21 gauge micropuncture needle. Using standard technique, the initial micro needle was exchanged over a 0.018 micro wire for a transitional 4 [place] micro sheath. The micro sheath was then exchanged over a 0.035 wire for a 5 French vascular sheath. Initially, a RIM catheter  was introduced into the abdominal aorta over a Bentson wire. The aorta is tortuous and variant in caliber with portions that are ectatic, and portions that are small. Heavily calcified atherosclerotic plaque noted throughout. The origin of the inferior mesenteric artery could not be engaged with this catheter. Therefore, the catheter was exchanged for a Mickelson catheter. The Mickelson catheter was formed. Again, the origin of the IMA could not be successfully engaged. The Mickelson catheter was then exchanged for a Sos Omni selective catheter. Despite multiple attempts, the origin of the IMA could not be successfully identified through the heavily calcified plaque. At this point, the decision was made to pursue angiography of the superior mesenteric artery to see if there is a communication into the IMA territory via the middle colic artery. A C2 cobra catheter was advanced over the Bentson wire and used to select the superior mesenteric artery. An angiogram was performed. No obvious communication with the IMA. No evidence of active bleeding. A renegade STC microcatheter was advanced over a Fathom 16 wire and used to select a proximal branch arising from the IMA. Arteriography was performed. This is a jejunal branch with normal opacification of the jejunal mucosa. No evidence of active bleeding. No communication with the IMA territory. The microcatheter was used to select another branch artery. Contrast injection demonstrates filling of the redundant transverse colon. This appears to be the left middle colic artery colic artery. No evidence of active  bleeding. The microcatheter was advanced further into the unnamed branch supplying the distal transverse colon. Super selective arteriography was performed. Again, no evidence of active bleeding. No communication with the main IMA territory. At this time, the decision was made to go back to attempting to catheterize the inferior mesenteric artery as this would provide the arterial supply to the region of active bleeding seen on the recent CT arteriogram. The Sos microcatheter was reintroduced. Additional arteriography was performed in multiple obliquities in till the origin of the IMA was successfully identified. Due to stenosis at the origin, the catheter tip could not be engaged in the artery. However, the catheter was positioned just outside the arterial ostium and ultimately, the renegade STC microcatheter and Fathom wire were successfully floated into the inferior mesenteric artery. Arteriography was then performed. No evidence of active bleeding on initial angiography. Due to the heavily stenotic origin, opacification of the arteries is somewhat limited. Therefore, the microcatheter was advanced more distally into a sigmoid branch of the IMA. Contrast injection was performed and demonstrates no evidence of active bleeding. The microcatheter was next advanced into the left colic artery. Contrast injection was performed. Again, no evidence of active bleeding. The patient's diverticular bleed appears to have spontaneously stopped. Catheters were removed. Hemostasis was attained with the aid of a Celt closure device. IMPRESSION: 1. Heavily diseased and calcified arteries. Catheterization of the inferior mesenteric artery was extremely challenging but ultimately successful. 2. No evidence of active bleeding at the time of arteriography. Signed, Criselda Peaches, MD, [facility]iology Electronically Signed   By: Jacqulynn Cadet M.D.   On: 02/18/2020  08:29   CT Angio Abd/Pel w/ and/or w/o  Addendum Date: 02/17/2020   ADDENDUM REPORT: 02/17/2020 18:33 ADDENDUM: Critical Value/emergent results were called by telephone at the time of interpretation on 02/17/2020 at 6:32 pm to provider Dr. Louanne Belton, Who verbally acknowledged these results. Electronically Signed   By: Marijo Conception M.D.   On: 02/17/2020 18:33   Result Date: 02/17/2020  CLINICAL DATA:  Rectal bleeding.  Anemia. EXAM: CTA ABDOMEN AND PELVIS WITHOUT AND WITH CONTRAST TECHNIQUE: Multidetector CT imaging of the abdomen and pelvis was performed using the standard protocol during bolus administration of intravenous contrast. Multiplanar reconstructed images and MIPs were obtained and reviewed to evaluate the vascular anatomy. CONTRAST:  OMNIPAQUE IOHEXOL 350 MG/ML SOLN COMPARISON:  None. FINDINGS: VASCULAR Aorta: Atherosclerosis of thoracic aorta is noted without aneurysm or dissection. Celiac: Patent without evidence of aneurysm, dissection, vasculitis or significant stenosis. SMA: Patent without evidence of aneurysm, dissection, vasculitis or significant stenosis. Renals: 2 right renal arteries are noted which are widely patent without significant stenosis. However, heavily calcified plaque is seen involving the proximal portion of the left renal artery resulting in moderate to severe focal stenosis. IMA: Patent without evidence of aneurysm, dissection, vasculitis or significant stenosis. Inflow: Common iliac arteries bilaterally are widely patent. However, multiple moderate stenoses are noted in the right external iliac artery secondary to calcified plaque. Multiple moderate stenoses are also noted in the left external iliac artery secondary to calcified plaque. Proximal Outflow: Moderate to severe narrowing is seen involving the visualized portions of the common and superficial femoral arteries bilaterally secondary to calcified plaque. Veins: No obvious venous abnormality within the limitations  of this arterial phase study. Review of the MIP images confirms the above findings. NON-VASCULAR Lower chest: Small left pleural effusion is noted with adjacent subsegmental atelectasis. Hepatobiliary: No gallstones or biliary dilatation is noted. Left hepatic cyst is noted. Pancreas: Unremarkable. No pancreatic ductal dilatation or surrounding inflammatory changes. Spleen: Normal in size without focal abnormality. Adrenals/Urinary Tract: Adrenal glands are unremarkable. Kidneys are normal, without renal calculi, focal lesion, or hydronephrosis. Bladder is unremarkable. Stomach/Bowel: Stomach is within normal limits. Appendix appears normal. There is no evidence of bowel obstruction. However, there is seen a small focus of extravasation of contrast in a portion of the proximal sigmoid colon best seen on image number 101 of series 7. This is consistent with active gastrointestinal hemorrhage. Lymphatic: No adenopathy is noted. Reproductive: Uterus and bilateral adnexa are unremarkable. Other: No abdominal wall hernia or abnormality. No abdominopelvic ascites. Musculoskeletal: No acute or significant osseous findings. IMPRESSION: Small focus of extravasation of contrast is seen in a portion of the proximal sigmoid colon consistent with active gastrointestinal hemorrhage. These results will be called to the ordering clinician or representative by the Radiologist Assistant, and communication documented in the PACS or zVision Dashboard. Severe stenosis is seen involving proximal portion of left renal artery secondary to calcified plaque. Multiple moderate to severe stenoses are noted in both external iliac arteries as well as the proximal femoral artery secondary to calcified plaque. Small left pleural effusion is noted with adjacent subsegmental atelectasis. Aortic Atherosclerosis (ICD10-I70.0). Electronically Signed: By: Lupita Raider M.D. On: 02/17/2020 18:24    Assessment/Plan: GI bleed Hospitalized  02/17/20-02/21/20 for GI bleed/rectal bleed/diverticular bleed, placed on Protonix, EGD-possible mild candida esophagitis,  f/u GI Dr. Leone Payor.  CBC, BMP one week. Anemia, dropped to 6.8 in hospital s/p 2 units of PRBC,  Vit B12, Hgb 7.7 02/20/20. Adding Fe 325mg  3x/wk.    Osteoarthritis Chronic lower back, leg pain, on Flexeril 2.5mg  qhs, Lyrica 50mg  qd,    Hypertension HTN, blood pressure is controlled on Metoprolol 12.5mg  bid. Bun/creat 15//0.71 02/20/20   UTI (urinary tract infection) Urinary symptoms, off  Nitrofurantoin 50mg  qd for UTI suppression, Dr. .   Atrophic vaginitis Estrace vaginal cream for atrophic vaginitis.   GERD GERD/chronic gastritis/atrophic gastritis  per biopsy, takes Protonix. F/u GI prn   COPD (chronic obstructive pulmonary disease) with chronic bronchitis (HCC) COPD, takes Breo Ellipta   Fibromyalgia Fibromyalgia takes Lyrica   Tactile hallucination Tactile hallucinations, off meds.   Slow transit constipation MiraLax 2x/wk    Family/ staff Communication: plan of care reviewed with the patient and charge nurse.   Labs/tests ordered:  CBC/diff, BMP one week.   Time spend 40 minutes.

## 2020-02-21 NOTE — Assessment & Plan Note (Signed)
HTN, blood pressure is controlled on Metoprolol 12.5mg  bid. Bun/creat 15//0.71 02/20/20

## 2020-02-21 NOTE — Assessment & Plan Note (Signed)
Estrace vaginal cream for atrophic vaginitis.  

## 2020-02-21 NOTE — TOC Initial Note (Signed)
Transition of Care [facility]) - Initial/Assessment Note    Patient Details  Name: Lisa King MRN: [id number] Date of Birth: 1931/03/22  Transition of [facility]) CM/SW Contact:    Bartholome Bill, RN Phone Number: 02/21/2020, 2:25 PM  Clinical Narrative:                 Pt is from [facility]. She is to return there today. IVY from [facility] contacted to alert of DC.  FL2 and DC summary sent via hub. Pt daughter to transport pt back to ALF.   Expected Discharge Plan: Assisted Living Barriers to Discharge: No Barriers Identified   Patient Goals and CMS Choice Patient states their goals for this hospitalization and ongoing recovery are:: to get home      Expected Discharge Plan and Services Expected Discharge Plan: Assisted Living   Discharge Planning Services: CM Consult   Living arrangements for the past 2 months: Assisted Living Facility Expected Discharge Date: 02/21/20                                    Prior Living Arrangements/Services Living arrangements for the past 2 months: Assisted Living Facility Lives with:: Facility Resident Patient language and need for interpreter reviewed:: Yes Do you feel safe going back to the place where you live?: Yes      Need for Family Participation in Patient Care: Yes (Comment) Care giver support system in place?: Yes (comment)   Criminal Activity/Legal Involvement Pertinent to Current Situation/Hospitalization: No - Comment as needed  Activities of Daily Living Home Assistive Devices/Equipment: Walker (specify type) (four wheel) ADL Screening (condition at time of admission) Patient's cognitive ability adequate to safely complete daily activities?: Yes Is the patient deaf or have difficulty hearing?: Yes Does the patient have difficulty seeing, even when wearing glasses/contacts?: Yes Does the patient have difficulty concentrating, remembering, or making decisions?: Yes Patient able to express need for  assistance with ADLs?: Yes Does the patient have difficulty dressing or bathing?: No Independently performs ADLs?: No Communication: Independent Dressing (OT): Appropriate for developmental age Grooming: Appropriate for developmental age Feeding: Appropriate for developmental age Bathing: Appropriate for developmental age Toileting: Appropriate for developmental age In/Out Bed: Appropriate for developmental age 74 in Home: Appropriate for developmental age Does the patient have difficulty walking or climbing stairs?: Yes Weakness of Legs: Both Weakness of Arms/Hands: Both  Permission Sought/Granted   Permission granted to share information with : Yes, Verbal Permission Granted     Permission granted to share info w AGENCY: [facility]        Emotional Assessment Appearance:: Appears stated age Attitude/Demeanor/Rapport: Gracious Affect (typically observed): Calm Orientation: : Oriented to Self,Oriented to Place,Oriented to  Time,Oriented to Situation Alcohol / Substance Use: Not Applicable    Admission diagnosis:  Acute GI bleeding [K92.2] Lower GI bleed [K92.2] Patient Active Problem List   Diagnosis Date Noted  . Chronic gastritis 02/20/2020  . Diverticulosis of large intestine with hemorrhage   . Hematochezia   . GI bleed 02/16/2020  . Systolic murmur 02/16/2020  . Chronic anemia 10/16/2019  . Cough 10/25/2018  . Dysuria 09/12/2018  . Atrophic vaginitis 06/28/2018  . Hypertension 11/15/2017  . Insomnia secondary to anxiety 07/21/2017  . Bilateral dry eyes 02/17/2017  . UTI (urinary tract infection) 09/17/2016  . 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 Hammer toe of second toe of left foot  04/08/2016  . 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  . Lower gastrointestinal bleed 09/14/2013  . Personal history of colonic polyps 09/14/2013  . ABLA (acute blood loss anemia) 08/23/2013  . Hearing loss  09/05/2008  . Anxiety state 07/02/2007  . HYPERCHOLESTEROLEMIA 06/19/2007  . COPD (chronic obstructive pulmonary disease) with chronic bronchitis (HCC) 12/19/2006  . GERD 12/19/2006  . Osteoarthritis 12/19/2006  . Fibromyalgia 12/19/2006  . Osteoporosis 12/19/2006   PCP:  Mast, Man X, NP Pharmacy:  No Pharmacies Listed    Social Determinants of Health (SDOH) Interventions    Readmission Risk Interventions No flowsheet data found.

## 2020-02-21 NOTE — NC FL2 (Signed)
[place] MEDICAID FL2 LEVEL OF CARE SCREENING TOOL     IDENTIFICATION  Patient Name: Lisa King Birthdate: 1931-08-03 Sex: female Admission Date (Current Location): 02/16/2020  [facility] and [place] Number:  Producer, television/film/video and Address:  [facility],  [address] [place], [place] [address]      Provider Number: [id number]  Attending Physician Name and Address:  Narda Bonds, MD  Relative Name and Phone Number:       Current Level of Care: Hospital Recommended Level of Care: Assisted Living Facility Prior Approval Number:    Date Approved/Denied:   PASRR Number:    Discharge Plan:  (Assisted Living)    Current Diagnoses: Patient Active Problem List   Diagnosis Date Noted  . Chronic gastritis 02/20/2020  . Diverticulosis of large intestine with hemorrhage   . Hematochezia   . GI bleed 02/16/2020  . Systolic murmur 02/16/2020  . Chronic anemia 10/16/2019  . Cough 10/25/2018  . Dysuria 09/12/2018  . Atrophic vaginitis 06/28/2018  . Hypertension 11/15/2017  . Insomnia secondary to anxiety 07/21/2017  . Bilateral dry eyes 02/17/2017  . UTI (urinary tract infection) 09/17/2016  . 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 Hammer toe of second toe of left foot 04/08/2016  . 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  . Lower gastrointestinal bleed 09/14/2013  . Personal history of colonic polyps 09/14/2013  . ABLA (acute blood loss anemia) 08/23/2013  . Hearing loss 09/05/2008  . Anxiety state 07/02/2007  . HYPERCHOLESTEROLEMIA 06/19/2007  . COPD (chronic obstructive pulmonary disease) with chronic bronchitis (HCC) 12/19/2006  . GERD 12/19/2006  . Osteoarthritis 12/19/2006  . Fibromyalgia 12/19/2006  . Osteoporosis 12/19/2006    Orientation RESPIRATION BLADDER Height & Weight     Self,Time,Situation,Place  Normal Continent Weight: 62.9 kg Height:  5\' 2"  (157.5 cm)  BEHAVIORAL SYMPTOMS/MOOD  NEUROLOGICAL BOWEL NUTRITION STATUS      Continent Diet (Regular)  AMBULATORY STATUS COMMUNICATION OF NEEDS Skin   Supervision Verbally Skin abrasions                       Personal Care Assistance Level of Assistance  Bathing,Dressing Bathing Assistance: Limited assistance   Dressing Assistance: Limited assistance     Functional Limitations Info  Sight,Hearing Sight Info: Impaired Hearing Info: Impaired      SPECIAL CARE FACTORS FREQUENCY                       Contractures Contractures Info: Not present    Additional Factors Info  Code Status,Allergies Code Status Info: DNR Allergies Info: Penicillins, Bisphosphonates, Influenza vaccines, Simvastatin, Trazodone and Nefazodone, Sulfonamide Derivatives           Current Medications (02/21/2020):  This is the current hospital active medication list Current Facility-Administered Medications  Medication Dose Route Frequency Provider Last Rate Last Admin  . 0.9 %  sodium chloride infusion (Manually program via Guardrails IV Fluids)   Intravenous Once Pokhrel, Laxman, MD      . 0.9 %  sodium chloride infusion (Manually program via Guardrails IV Fluids)   Intravenous Once Jimmye Norman, NP      . Chlorhexidine Gluconate Cloth 2 % PADS 6 each  6 each Topical Daily Pokhrel, Laxman, MD   6 each at 02/21/20 1016  . cyclobenzaprine (FLEXERIL) tablet 2.5 mg  2.5 mg Oral QHS Pokhrel, Laxman, MD   2.5 mg at 02/20/20 2150  .  estradiol (ESTRACE) vaginal cream 1 Applicatorful  1 Applicatorful Vaginal Q M,W,F Pokhrel, Laxman, MD      . fluticasone furoate-vilanterol (BREO ELLIPTA) 100-25 MCG/INH 1 puff  1 puff Inhalation Daily Jae Dire, MD   1 puff at 02/21/20 0752  . hydrocortisone cream 1 % 1 application  1 application Topical BID PRN Pokhrel, Laxman, MD   1 application at 02/19/20 1501  . metoprolol tartrate (LOPRESSOR) tablet 12.5 mg  12.5 mg Oral BID Pokhrel, Laxman, MD   12.5 mg at 02/21/20 1015  .  ondansetron (ZOFRAN) injection 4 mg  4 mg Intravenous Q6H PRN Pokhrel, Laxman, MD      . pantoprazole (PROTONIX) EC tablet 40 mg  40 mg Oral Q0600 Lynann Bologna, MD   40 mg at 02/21/20 0607  . polyethylene glycol (MIRALAX / GLYCOLAX) packet 17 g  17 g Oral Once per day on Mon Thu Pokhrel, Laxman, MD      . pregabalin (LYRICA) capsule 50 mg  50 mg Oral Daily Pokhrel, Laxman, MD   50 mg at 02/21/20 1015  . sodium chloride flush (NS) 0.9 % injection 3 mL  3 mL Intravenous Q12H Jae Dire, MD   3 mL at 02/21/20 1017     Discharge Medications: Medication List    STOP taking these medications   Calcium 600+D Plus Minerals 600-400 MG-UNIT Tabs   naproxen sodium 220 MG tablet Commonly known as: ALEVE   nitrofurantoin 50 MG capsule Commonly known as: MACRODANTIN     TAKE these medications   budesonide-formoterol 80-4.5 MCG/ACT inhaler Commonly known as: SYMBICORT Inhale 1 puff into the lungs 2 (two) times daily.   calcium carbonate 750 MG chewable tablet Commonly known as: TUMS EX Chew 750 mg by mouth daily.   cholecalciferol 1000 units tablet Commonly known as: VITAMIN D Take 1,000 Units by mouth daily.   cyclobenzaprine 5 MG tablet Commonly known as: FLEXERIL Take 2.5 mg by mouth at bedtime.   estradiol 0.1 MG/GM vaginal cream Commonly known as: ESTRACE Place 1 Applicatorful vaginally every Monday, Wednesday, and Friday.   hydrocortisone cream 1 % Apply 1 application topically 2 (two) times daily as needed (wrists).   metoprolol tartrate 25 MG tablet Commonly known as: LOPRESSOR Take 12.5 mg by mouth 2 (two) times daily.   pantoprazole 40 MG tablet Commonly known as: PROTONIX Take 1 tablet (40 mg total) by mouth daily at 6 (six) AM. Start taking on: February 22, 2020   polyethylene glycol 17 g packet Commonly known as: MIRALAX / GLYCOLAX Take 17 g by mouth 2 (two) times a week. Sunday and Wednesday What changed: Another medication with the same name  was removed. Continue taking this medication, and follow the directions you see here.   pregabalin 50 MG capsule Commonly known as: LYRICA Take 1 capsule (50 mg total) by mouth daily.   vitamin B-12 1000 MCG tablet Commonly known as: CYANOCOBALAMIN Take 1,000 mcg by mouth daily.     Relevant Imaging Results:  Relevant Lab Results:   Additional Information [id number]  Mirabel Ahlgren, Meriam Sprague, RN

## 2020-02-21 NOTE — Assessment & Plan Note (Signed)
Hospitalized 02/17/20-02/21/20 for GI bleed/rectal bleed/diverticular bleed, placed on Protonix, EGD-possible mild candida esophagitis,  f/u GI Dr. Leone Payor.  CBC, BMP one week. Anemia, dropped to 6.8 in hospital s/p 2 units of PRBC,  Vit B12, Hgb 7.7 02/20/20. Adding Fe 325mg  3x/wk.

## 2020-02-21 NOTE — Assessment & Plan Note (Signed)
Urinary symptoms, off  Nitrofurantoin 50mg  qd for UTI suppression, Dr. Karsten Ro.

## 2020-02-21 NOTE — Discharge Summary (Signed)
Physician Discharge Summary  JAQUETTE REGIER NUU:[id number] DOB: 11-14-1931 DOA: 02/16/2020  PCP: Mast, Man X, NP  Admit date: 02/16/2020 Discharge date: 02/21/2020  Admitted From: [facility]position: [facility]  Recommendations for Outpatient Follow-up:  1. Follow up with PCP in 1 week 2. Follow up with GI as needed 3. Please obtain BMP/CBC in one week 4. Please follow up on the following pending results: None  Home Health: Home Health PT Equipment/Devices: None  Discharge Condition: Stable CODE STATUS: DNR Diet recommendation: Regular diet   Brief/Interim Summary:  Admission HPI written by Carlton Adam, MD   HPI:  This is an 87 year old female with past medical history of COPD, hemorrhoids, hypertension, hyperlipidemia, GERD who presented from her ALF for evaluation of rectal bleeding.  Patient had a large amount of maroon-colored stool yesterday evening around 6 pm when staff checked on her. She states this happened several times since then. Admits to an episode of nausea and diaphoresis without vomiting when she had a BM which has since resolved. Patient denied any abdominal pain or fevers.  She is not on anticoagulation but does take 2 tablets of Aleve daily for arthritis. States that she had a GI bleed once before several years ago.    Hospital course:  GI bleeding Diverticular bleed Acute blood loss anemia GI consulted. Patient started on empiric Protonix. EGD significant for possible mild candida esophagitis. CTA abdomen/pelvis significant for no active bleed. Hemoglobin baseline of 10-11 prior to admission with admission hemoglobin of 9.8. Hemoglobin downtrend to a low of 6.8. Patient required 2 units of PRBC total. Stable. Bowel movement that was non-bloody/melanotic prior to discharge. Gastric biopsy significant for chronic gastritis with findings consistent with atrophic gastritis. Outpatient GI follow-up with Dr. Leone Payor prn. Continue Protonix on  discharge.  Primary hypertension Continue metoprolol  Fibromyalgia Continue Lyrica  COPD Continue Breo Ellipta  Mild hypokalemia Potassium supplementation given.  Discharge Diagnoses:  Principal Problem:   GI bleed Active Problems:   COPD (chronic obstructive pulmonary disease) with chronic bronchitis (HCC)   ABLA (acute blood loss anemia)   Hypertension   Systolic murmur   Hematochezia   Diverticulosis of large intestine with hemorrhage    Discharge Instructions  Discharge Instructions    Increase activity slowly   Complete by: As directed    No wound care   Complete by: As directed      Allergies as of 02/21/2020      Reactions   Penicillins Anaphylaxis   Bisphosphonates Other (See Comments)   [name]   Influenza Vaccines    Pt reported   Simvastatin    Unknown    Trazodone And Nefazodone    Felt her throat was closing up/kept her awake   Sulfonamide Derivatives Rash, Other (See Comments)   blisters      Medication List    STOP taking these medications   Calcium 600+D Plus Minerals 600-400 MG-UNIT Tabs   naproxen sodium 220 MG tablet Commonly known as: ALEVE   nitrofurantoin 50 MG capsule Commonly known as: MACRODANTIN     TAKE these medications   budesonide-formoterol 80-4.5 MCG/ACT inhaler Commonly known as: SYMBICORT Inhale 1 puff into the lungs 2 (two) times daily.   calcium carbonate 750 MG chewable tablet Commonly known as: TUMS EX Chew 750 mg by mouth daily.   cholecalciferol 1000 units tablet Commonly known as: VITAMIN D Take 1,000 Units by mouth daily.   cyclobenzaprine 5 MG tablet Commonly known as: FLEXERIL  Take 2.5 mg by mouth at bedtime.   estradiol 0.1 MG/GM vaginal cream Commonly known as: ESTRACE Place 1 Applicatorful vaginally every Monday, Wednesday, and Friday.   hydrocortisone cream 1 % Apply 1 application topically 2 (two) times daily as needed (wrists).   metoprolol tartrate 25 MG tablet Commonly  known as: LOPRESSOR Take 12.5 mg by mouth 2 (two) times daily.   pantoprazole 40 MG tablet Commonly known as: PROTONIX Take 1 tablet (40 mg total) by mouth daily at 6 (six) AM. Start taking on: February 22, 2020   polyethylene glycol 17 g packet Commonly known as: MIRALAX / GLYCOLAX Take 17 g by mouth 2 (two) times a week. Sunday and Wednesday What changed: Another medication with the same name was removed. Continue taking this medication, and follow the directions you see here.   pregabalin 50 MG capsule Commonly known as: LYRICA Take 1 capsule (50 mg total) by mouth daily.   vitamin B-12 1000 MCG tablet Commonly known as: CYANOCOBALAMIN Take 1,000 mcg by mouth daily.       Follow-up Information    Mast, Man X, NP. Schedule an appointment as soon as possible for a visit in 1 week(s).   Specialty: Internal Medicine Why: Hospital follow-up Contact information: [address] [place] [address] [phone]        Meryl Dare, MD. Schedule an appointment as soon as possible for a visit in 2 week(s).   Specialty: Gastroenterology Why: Hospital follow-up for GI bleeding Contact information: [address] [place] [address] [phone]              Allergies  Allergen Reactions  . Penicillins Anaphylaxis  . Bisphosphonates Other (See Comments)    [name]  . Influenza Vaccines     Pt reported  . Simvastatin     Unknown   . Trazodone And Nefazodone     Felt her throat was closing up/kept her awake  . Sulfonamide Derivatives Rash and Other (See Comments)    blisters    Consultations:  [facility]   Procedures/Studies: IR Angiogram Visceral Selective  Result Date: 02/18/2020 INDICATION: 87 year old female with acute sigmoid diverticular bleed. She presents for arteriogram and possible embolization. EXAM: SELECTIVE VISCERAL ARTERIOGRAPHY; IR ULTRASOUND GUIDANCE VASC ACCESS RIGHT; ADDITIONAL ARTERIOGRAPHY MEDICATIONS:  None. ANESTHESIA/SEDATION: 1 mg Versed administered for anxiolysis. CONTRAST:  30mL OMNIPAQUE IOHEXOL 300 MG/ML SOLN, OMNIPAQUE IOHEXOL 300 MG/ML SOLN, 30mL OMNIPAQUE IOHEXOL 300 MG/ML SOLN, 30mL OMNIPAQUE IOHEXOL 300 MG/ML SOLN FLUOROSCOPY TIME:  Fluoroscopy Time: 25 minutes 12 seconds (1901 mGy). COMPLICATIONS: None immediate. PROCEDURE: Informed consent was obtained from the patient following explanation of the procedure, risks, benefits and alternatives. The patient understands, agrees and consents for the procedure. All questions were addressed. A time out was performed prior to the initiation of the procedure. Maximal barrier sterile technique utilized including caps, mask, sterile gowns, sterile gloves, large sterile drape, hand hygiene, and Betadine prep. The right common femoral artery was interrogated with ultrasound and found to be widely patent. An image was obtained and stored for the medical record. Local anesthesia was attained by infiltration with 1% lidocaine. A small dermatotomy was made. Under real-time sonographic guidance, the vessel was punctured with a 21 gauge micropuncture needle. Using standard technique, the initial micro needle was exchanged over a 0.018 micro wire for a transitional 4 [place] micro sheath. The micro sheath was then exchanged over a 0.035 wire for a 5 French vascular sheath. Initially, a RIM catheter  was introduced into the abdominal aorta over a Bentson wire. The aorta is tortuous and variant in caliber with portions that are ectatic, and portions that are small. Heavily calcified atherosclerotic plaque noted throughout. The origin of the inferior mesenteric artery could not be engaged with this catheter. Therefore, the catheter was exchanged for a Mickelson catheter. The Mickelson catheter was formed. Again, the origin of the IMA could not be successfully engaged. The Mickelson catheter was then exchanged for a Sos Omni selective catheter. Despite multiple  attempts, the origin of the IMA could not be successfully identified through the heavily calcified plaque. At this point, the decision was made to pursue angiography of the superior mesenteric artery to see if there is a communication into the IMA territory via the middle colic artery. A C2 cobra catheter was advanced over the Bentson wire and used to select the superior mesenteric artery. An angiogram was performed. No obvious communication with the IMA. No evidence of active bleeding. A renegade STC microcatheter was advanced over a Fathom 16 wire and used to select a proximal branch arising from the IMA. Arteriography was performed. This is a jejunal branch with normal opacification of the jejunal mucosa. No evidence of active bleeding. No communication with the IMA territory. The microcatheter was used to select another branch artery. Contrast injection demonstrates filling of the redundant transverse colon. This appears to be the left middle colic artery colic artery. No evidence of active bleeding. The microcatheter was advanced further into the unnamed branch supplying the distal transverse colon. Super selective arteriography was performed. Again, no evidence of active bleeding. No communication with the main IMA territory. At this time, the decision was made to go back to attempting to catheterize the inferior mesenteric artery as this would provide the arterial supply to the region of active bleeding seen on the recent CT arteriogram. The Sos microcatheter was reintroduced. Additional arteriography was performed in multiple obliquities in till the origin of the IMA was successfully identified. Due to stenosis at the origin, the catheter tip could not be engaged in the artery. However, the catheter was positioned just outside the arterial ostium and ultimately, the renegade STC microcatheter and Fathom wire were successfully floated into the inferior mesenteric artery. Arteriography was then performed. No  evidence of active bleeding on initial angiography. Due to the heavily stenotic origin, opacification of the arteries is somewhat limited. Therefore, the microcatheter was advanced more distally into a sigmoid branch of the IMA. Contrast injection was performed and demonstrates no evidence of active bleeding. The microcatheter was next advanced into the left colic artery. Contrast injection was performed. Again, no evidence of active bleeding. The patient's diverticular bleed appears to have spontaneously stopped. Catheters were removed. Hemostasis was attained with the aid of a Celt closure device. IMPRESSION: 1. Heavily diseased and calcified arteries. Catheterization of the inferior mesenteric artery was extremely challenging but ultimately successful. 2. No evidence of active bleeding at the time of arteriography. Signed, Sterling Big, MD, RPVI [facility] Radiology Electronically Signed   By: Malachy Moan M.D.   On: 02/18/2020 08:29   IR Angiogram Visceral Selective  Result Date: 02/18/2020 INDICATION: 87 year old female with acute sigmoid diverticular bleed. She presents for arteriogram and possible embolization. EXAM: SELECTIVE VISCERAL ARTERIOGRAPHY; IR ULTRASOUND GUIDANCE VASC ACCESS RIGHT; ADDITIONAL ARTERIOGRAPHY MEDICATIONS: None. ANESTHESIA/SEDATION: 1 mg Versed administered for anxiolysis. CONTRAST:  30mL OMNIPAQUE IOHEXOL 300 MG/ML SOLN, OMNIPAQUE IOHEXOL 300 MG/ML SOLN, 30mL OMNIPAQUE IOHEXOL 300 MG/ML  SOLN, 30mL OMNIPAQUE IOHEXOL 300 MG/ML SOLN FLUOROSCOPY TIME:  Fluoroscopy Time: 25 minutes 12 seconds (1901 mGy). COMPLICATIONS: None immediate. PROCEDURE: Informed consent was obtained from the patient following explanation of the procedure, risks, benefits and alternatives. The patient understands, agrees and consents for the procedure. All questions were addressed. A time out was performed prior to the initiation of the  procedure. Maximal barrier sterile technique utilized including caps, mask, sterile gowns, sterile gloves, large sterile drape, hand hygiene, and Betadine prep. The right common femoral artery was interrogated with ultrasound and found to be widely patent. An image was obtained and stored for the medical record. Local anesthesia was attained by infiltration with 1% lidocaine. A small dermatotomy was made. Under real-time sonographic guidance, the vessel was punctured with a 21 gauge micropuncture needle. Using standard technique, the initial micro needle was exchanged over a 0.018 micro wire for a transitional 4 [place] micro sheath. The micro sheath was then exchanged over a 0.035 wire for a 5 French vascular sheath. Initially, a RIM catheter was introduced into the abdominal aorta over a Bentson wire. The aorta is tortuous and variant in caliber with portions that are ectatic, and portions that are small. Heavily calcified atherosclerotic plaque noted throughout. The origin of the inferior mesenteric artery could not be engaged with this catheter. Therefore, the catheter was exchanged for a Mickelson catheter. The Mickelson catheter was formed. Again, the origin of the IMA could not be successfully engaged. The Mickelson catheter was then exchanged for a Sos Omni selective catheter. Despite multiple attempts, the origin of the IMA could not be successfully identified through the heavily calcified plaque. At this point, the decision was made to pursue angiography of the superior mesenteric artery to see if there is a communication into the IMA territory via the middle colic artery. A C2 cobra catheter was advanced over the Bentson wire and used to select the superior mesenteric artery. An angiogram was performed. No obvious communication with the IMA. No evidence of active bleeding. A renegade STC microcatheter was advanced over a Fathom 16 wire and used to select a proximal branch arising from the IMA.  Arteriography was performed. This is a jejunal branch with normal opacification of the jejunal mucosa. No evidence of active bleeding. No communication with the IMA territory. The microcatheter was used to select another branch artery. Contrast injection demonstrates filling of the redundant transverse colon. This appears to be the left middle colic artery colic artery. No evidence of active bleeding. The microcatheter was advanced further into the unnamed branch supplying the distal transverse colon. Super selective arteriography was performed. Again, no evidence of active bleeding. No communication with the main IMA territory. At this time, the decision was made to go back to attempting to catheterize the inferior mesenteric artery as this would provide the arterial supply to the region of active bleeding seen on the recent CT arteriogram. The Sos microcatheter was reintroduced. Additional arteriography was performed in multiple obliquities in till the origin of the IMA was successfully identified. Due to stenosis at the origin, the catheter tip could not be engaged in the artery. However, the catheter was positioned just outside the arterial ostium and ultimately, the renegade STC microcatheter and Fathom wire were successfully floated into the inferior mesenteric artery. Arteriography was then performed. No evidence of active bleeding on initial angiography. Due to the heavily stenotic origin, opacification of the arteries is somewhat limited. Therefore, the microcatheter was advanced more distally into a sigmoid branch of the  IMA. Contrast injection was performed and demonstrates no evidence of active bleeding. The microcatheter was next advanced into the left colic artery. Contrast injection was performed. Again, no evidence of active bleeding. The patient's diverticular bleed appears to have spontaneously stopped. Catheters were removed. Hemostasis was attained with the aid of a Celt closure device.  IMPRESSION: 1. Heavily diseased and calcified arteries. Catheterization of the inferior mesenteric artery was extremely challenging but ultimately successful. 2. No evidence of active bleeding at the time of arteriography. Signed, Sterling Big, MD, RPVI [facility] Radiology Electronically Signed   By: Malachy Moan M.D.   On: 02/18/2020 08:29   IR Angiogram Selective Each Additional Vessel  Result Date: 02/18/2020 INDICATION: 87 year old female with acute sigmoid diverticular bleed. She presents for arteriogram and possible embolization. EXAM: SELECTIVE VISCERAL ARTERIOGRAPHY; IR ULTRASOUND GUIDANCE VASC ACCESS RIGHT; ADDITIONAL ARTERIOGRAPHY MEDICATIONS: None. ANESTHESIA/SEDATION: 1 mg Versed administered for anxiolysis. CONTRAST:  30mL OMNIPAQUE IOHEXOL 300 MG/ML SOLN, OMNIPAQUE IOHEXOL 300 MG/ML SOLN, 30mL OMNIPAQUE IOHEXOL 300 MG/ML SOLN, 30mL OMNIPAQUE IOHEXOL 300 MG/ML SOLN FLUOROSCOPY TIME:  Fluoroscopy Time: 25 minutes 12 seconds (1901 mGy). COMPLICATIONS: None immediate. PROCEDURE: Informed consent was obtained from the patient following explanation of the procedure, risks, benefits and alternatives. The patient understands, agrees and consents for the procedure. All questions were addressed. A time out was performed prior to the initiation of the procedure. Maximal barrier sterile technique utilized including caps, mask, sterile gowns, sterile gloves, large sterile drape, hand hygiene, and Betadine prep. The right common femoral artery was interrogated with ultrasound and found to be widely patent. An image was obtained and stored for the medical record. Local anesthesia was attained by infiltration with 1% lidocaine. A small dermatotomy was made. Under real-time sonographic guidance, the vessel was punctured with a 21 gauge micropuncture needle. Using standard technique, the initial micro needle was exchanged over a 0.018 micro wire for  a transitional 4 [place] micro sheath. The micro sheath was then exchanged over a 0.035 wire for a 5 French vascular sheath. Initially, a RIM catheter was introduced into the abdominal aorta over a Bentson wire. The aorta is tortuous and variant in caliber with portions that are ectatic, and portions that are small. Heavily calcified atherosclerotic plaque noted throughout. The origin of the inferior mesenteric artery could not be engaged with this catheter. Therefore, the catheter was exchanged for a Mickelson catheter. The Mickelson catheter was formed. Again, the origin of the IMA could not be successfully engaged. The Mickelson catheter was then exchanged for a Sos Omni selective catheter. Despite multiple attempts, the origin of the IMA could not be successfully identified through the heavily calcified plaque. At this point, the decision was made to pursue angiography of the superior mesenteric artery to see if there is a communication into the IMA territory via the middle colic artery. A C2 cobra catheter was advanced over the Bentson wire and used to select the superior mesenteric artery. An angiogram was performed. No obvious communication with the IMA. No evidence of active bleeding. A renegade STC microcatheter was advanced over a Fathom 16 wire and used to select a proximal branch arising from the IMA. Arteriography was performed. This is a jejunal branch with normal opacification of the jejunal mucosa. No evidence of active bleeding. No communication with the IMA territory. The microcatheter was used to select another branch artery. Contrast injection demonstrates filling of the redundant transverse colon. This appears to be the left middle colic artery  colic artery. No evidence of active bleeding. The microcatheter was advanced further into the unnamed branch supplying the distal transverse colon. Super selective arteriography was performed. Again, no evidence of active bleeding. No communication with  the main IMA territory. At this time, the decision was made to go back to attempting to catheterize the inferior mesenteric artery as this would provide the arterial supply to the region of active bleeding seen on the recent CT arteriogram. The Sos microcatheter was reintroduced. Additional arteriography was performed in multiple obliquities in till the origin of the IMA was successfully identified. Due to stenosis at the origin, the catheter tip could not be engaged in the artery. However, the catheter was positioned just outside the arterial ostium and ultimately, the renegade STC microcatheter and Fathom wire were successfully floated into the inferior mesenteric artery. Arteriography was then performed. No evidence of active bleeding on initial angiography. Due to the heavily stenotic origin, opacification of the arteries is somewhat limited. Therefore, the microcatheter was advanced more distally into a sigmoid branch of the IMA. Contrast injection was performed and demonstrates no evidence of active bleeding. The microcatheter was next advanced into the left colic artery. Contrast injection was performed. Again, no evidence of active bleeding. The patient's diverticular bleed appears to have spontaneously stopped. Catheters were removed. Hemostasis was attained with the aid of a Celt closure device. IMPRESSION: 1. Heavily diseased and calcified arteries. Catheterization of the inferior mesenteric artery was extremely challenging but ultimately successful. 2. No evidence of active bleeding at the time of arteriography. Signed, Sterling Big, MD, RPVI [facility] Radiology Electronically Signed   By: Malachy Moan M.D.   On: 02/18/2020 08:29   IR Angiogram Selective Each Additional Vessel  Result Date: 02/18/2020 INDICATION: 87 year old female with acute sigmoid diverticular bleed. She presents for arteriogram and possible embolization. EXAM:  SELECTIVE VISCERAL ARTERIOGRAPHY; IR ULTRASOUND GUIDANCE VASC ACCESS RIGHT; ADDITIONAL ARTERIOGRAPHY MEDICATIONS: None. ANESTHESIA/SEDATION: 1 mg Versed administered for anxiolysis. CONTRAST:  30mL OMNIPAQUE IOHEXOL 300 MG/ML SOLN, OMNIPAQUE IOHEXOL 300 MG/ML SOLN, 30mL OMNIPAQUE IOHEXOL 300 MG/ML SOLN, 30mL OMNIPAQUE IOHEXOL 300 MG/ML SOLN FLUOROSCOPY TIME:  Fluoroscopy Time: 25 minutes 12 seconds (1901 mGy). COMPLICATIONS: None immediate. PROCEDURE: Informed consent was obtained from the patient following explanation of the procedure, risks, benefits and alternatives. The patient understands, agrees and consents for the procedure. All questions were addressed. A time out was performed prior to the initiation of the procedure. Maximal barrier sterile technique utilized including caps, mask, sterile gowns, sterile gloves, large sterile drape, hand hygiene, and Betadine prep. The right common femoral artery was interrogated with ultrasound and found to be widely patent. An image was obtained and stored for the medical record. Local anesthesia was attained by infiltration with 1% lidocaine. A small dermatotomy was made. Under real-time sonographic guidance, the vessel was punctured with a 21 gauge micropuncture needle. Using standard technique, the initial micro needle was exchanged over a 0.018 micro wire for a transitional 4 [place] micro sheath. The micro sheath was then exchanged over a 0.035 wire for a 5 French vascular sheath. Initially, a RIM catheter was introduced into the abdominal aorta over a Bentson wire. The aorta is tortuous and variant in caliber with portions that are ectatic, and portions that are small. Heavily calcified atherosclerotic plaque noted throughout. The origin of the inferior mesenteric artery could not be engaged with this catheter. Therefore, the catheter was exchanged for a Mickelson catheter. The Mickelson catheter was formed. Again,  the origin of the IMA could not be  successfully engaged. The Mickelson catheter was then exchanged for a Sos Omni selective catheter. Despite multiple attempts, the origin of the IMA could not be successfully identified through the heavily calcified plaque. At this point, the decision was made to pursue angiography of the superior mesenteric artery to see if there is a communication into the IMA territory via the middle colic artery. A C2 cobra catheter was advanced over the Bentson wire and used to select the superior mesenteric artery. An angiogram was performed. No obvious communication with the IMA. No evidence of active bleeding. A renegade STC microcatheter was advanced over a Fathom 16 wire and used to select a proximal branch arising from the IMA. Arteriography was performed. This is a jejunal branch with normal opacification of the jejunal mucosa. No evidence of active bleeding. No communication with the IMA territory. The microcatheter was used to select another branch artery. Contrast injection demonstrates filling of the redundant transverse colon. This appears to be the left middle colic artery colic artery. No evidence of active bleeding. The microcatheter was advanced further into the unnamed branch supplying the distal transverse colon. Super selective arteriography was performed. Again, no evidence of active bleeding. No communication with the main IMA territory. At this time, the decision was made to go back to attempting to catheterize the inferior mesenteric artery as this would provide the arterial supply to the region of active bleeding seen on the recent CT arteriogram. The Sos microcatheter was reintroduced. Additional arteriography was performed in multiple obliquities in till the origin of the IMA was successfully identified. Due to stenosis at the origin, the catheter tip could not be engaged in the artery. However, the catheter was positioned just outside the arterial ostium and ultimately, the renegade STC microcatheter  and Fathom wire were successfully floated into the inferior mesenteric artery. Arteriography was then performed. No evidence of active bleeding on initial angiography. Due to the heavily stenotic origin, opacification of the arteries is somewhat limited. Therefore, the microcatheter was advanced more distally into a sigmoid branch of the IMA. Contrast injection was performed and demonstrates no evidence of active bleeding. The microcatheter was next advanced into the left colic artery. Contrast injection was performed. Again, no evidence of active bleeding. The patient's diverticular bleed appears to have spontaneously stopped. Catheters were removed. Hemostasis was attained with the aid of a Celt closure device. IMPRESSION: 1. Heavily diseased and calcified arteries. Catheterization of the inferior mesenteric artery was extremely challenging but ultimately successful. 2. No evidence of active bleeding at the time of arteriography. Signed, Sterling Big, MD, RPVI [facility] Radiology Electronically Signed   By: Malachy Moan M.D.   On: 02/18/2020 08:29   IR Angiogram Selective Each Additional Vessel  Result Date: 02/18/2020 INDICATION: 87 year old female with acute sigmoid diverticular bleed. She presents for arteriogram and possible embolization. EXAM: SELECTIVE VISCERAL ARTERIOGRAPHY; IR ULTRASOUND GUIDANCE VASC ACCESS RIGHT; ADDITIONAL ARTERIOGRAPHY MEDICATIONS: None. ANESTHESIA/SEDATION: 1 mg Versed administered for anxiolysis. CONTRAST:  30mL OMNIPAQUE IOHEXOL 300 MG/ML SOLN, OMNIPAQUE IOHEXOL 300 MG/ML SOLN, 30mL OMNIPAQUE IOHEXOL 300 MG/ML SOLN, 30mL OMNIPAQUE IOHEXOL 300 MG/ML SOLN FLUOROSCOPY TIME:  Fluoroscopy Time: 25 minutes 12 seconds (1901 mGy). COMPLICATIONS: None immediate. PROCEDURE: Informed consent was obtained from the patient following explanation of the procedure, risks, benefits and alternatives. The patient understands,  agrees and consents for the procedure. All questions were addressed. A time out was performed prior to the initiation  of the procedure. Maximal barrier sterile technique utilized including caps, mask, sterile gowns, sterile gloves, large sterile drape, hand hygiene, and Betadine prep. The right common femoral artery was interrogated with ultrasound and found to be widely patent. An image was obtained and stored for the medical record. Local anesthesia was attained by infiltration with 1% lidocaine. A small dermatotomy was made. Under real-time sonographic guidance, the vessel was punctured with a 21 gauge micropuncture needle. Using standard technique, the initial micro needle was exchanged over a 0.018 micro wire for a transitional 4 [place] micro sheath. The micro sheath was then exchanged over a 0.035 wire for a 5 French vascular sheath. Initially, a RIM catheter was introduced into the abdominal aorta over a Bentson wire. The aorta is tortuous and variant in caliber with portions that are ectatic, and portions that are small. Heavily calcified atherosclerotic plaque noted throughout. The origin of the inferior mesenteric artery could not be engaged with this catheter. Therefore, the catheter was exchanged for a Mickelson catheter. The Mickelson catheter was formed. Again, the origin of the IMA could not be successfully engaged. The Mickelson catheter was then exchanged for a Sos Omni selective catheter. Despite multiple attempts, the origin of the IMA could not be successfully identified through the heavily calcified plaque. At this point, the decision was made to pursue angiography of the superior mesenteric artery to see if there is a communication into the IMA territory via the middle colic artery. A C2 cobra catheter was advanced over the Bentson wire and used to select the superior mesenteric artery. An angiogram was performed. No obvious communication with the IMA. No evidence of active bleeding. A renegade  STC microcatheter was advanced over a Fathom 16 wire and used to select a proximal branch arising from the IMA. Arteriography was performed. This is a jejunal branch with normal opacification of the jejunal mucosa. No evidence of active bleeding. No communication with the IMA territory. The microcatheter was used to select another branch artery. Contrast injection demonstrates filling of the redundant transverse colon. This appears to be the left middle colic artery colic artery. No evidence of active bleeding. The microcatheter was advanced further into the unnamed branch supplying the distal transverse colon. Super selective arteriography was performed. Again, no evidence of active bleeding. No communication with the main IMA territory. At this time, the decision was made to go back to attempting to catheterize the inferior mesenteric artery as this would provide the arterial supply to the region of active bleeding seen on the recent CT arteriogram. The Sos microcatheter was reintroduced. Additional arteriography was performed in multiple obliquities in till the origin of the IMA was successfully identified. Due to stenosis at the origin, the catheter tip could not be engaged in the artery. However, the catheter was positioned just outside the arterial ostium and ultimately, the renegade STC microcatheter and Fathom wire were successfully floated into the inferior mesenteric artery. Arteriography was then performed. No evidence of active bleeding on initial angiography. Due to the heavily stenotic origin, opacification of the arteries is somewhat limited. Therefore, the microcatheter was advanced more distally into a sigmoid branch of the IMA. Contrast injection was performed and demonstrates no evidence of active bleeding. The microcatheter was next advanced into the left colic artery. Contrast injection was performed. Again, no evidence of active bleeding. The patient's diverticular bleed appears to have  spontaneously stopped. Catheters were removed. Hemostasis was attained with the aid of a Celt closure device. IMPRESSION: 1. Heavily diseased and  calcified arteries. Catheterization of the inferior mesenteric artery was extremely challenging but ultimately successful. 2. No evidence of active bleeding at the time of arteriography. Signed, Sterling Big, MD, RPVI [facility] Radiology Electronically Signed   By: Malachy Moan M.D.   On: 02/18/2020 08:29   IR Angiogram Selective Each Additional Vessel  Result Date: 02/18/2020 INDICATION: 87 year old female with acute sigmoid diverticular bleed. She presents for arteriogram and possible embolization. EXAM: SELECTIVE VISCERAL ARTERIOGRAPHY; IR ULTRASOUND GUIDANCE VASC ACCESS RIGHT; ADDITIONAL ARTERIOGRAPHY MEDICATIONS: None. ANESTHESIA/SEDATION: 1 mg Versed administered for anxiolysis. CONTRAST:  30mL OMNIPAQUE IOHEXOL 300 MG/ML SOLN, OMNIPAQUE IOHEXOL 300 MG/ML SOLN, 30mL OMNIPAQUE IOHEXOL 300 MG/ML SOLN, 30mL OMNIPAQUE IOHEXOL 300 MG/ML SOLN FLUOROSCOPY TIME:  Fluoroscopy Time: 25 minutes 12 seconds (1901 mGy). COMPLICATIONS: None immediate. PROCEDURE: Informed consent was obtained from the patient following explanation of the procedure, risks, benefits and alternatives. The patient understands, agrees and consents for the procedure. All questions were addressed. A time out was performed prior to the initiation of the procedure. Maximal barrier sterile technique utilized including caps, mask, sterile gowns, sterile gloves, large sterile drape, hand hygiene, and Betadine prep. The right common femoral artery was interrogated with ultrasound and found to be widely patent. An image was obtained and stored for the medical record. Local anesthesia was attained by infiltration with 1% lidocaine. A small dermatotomy was made. Under real-time sonographic guidance, the vessel was punctured with a 21 gauge  micropuncture needle. Using standard technique, the initial micro needle was exchanged over a 0.018 micro wire for a transitional 4 [place] micro sheath. The micro sheath was then exchanged over a 0.035 wire for a 5 French vascular sheath. Initially, a RIM catheter was introduced into the abdominal aorta over a Bentson wire. The aorta is tortuous and variant in caliber with portions that are ectatic, and portions that are small. Heavily calcified atherosclerotic plaque noted throughout. The origin of the inferior mesenteric artery could not be engaged with this catheter. Therefore, the catheter was exchanged for a Mickelson catheter. The Mickelson catheter was formed. Again, the origin of the IMA could not be successfully engaged. The Mickelson catheter was then exchanged for a Sos Omni selective catheter. Despite multiple attempts, the origin of the IMA could not be successfully identified through the heavily calcified plaque. At this point, the decision was made to pursue angiography of the superior mesenteric artery to see if there is a communication into the IMA territory via the middle colic artery. A C2 cobra catheter was advanced over the Bentson wire and used to select the superior mesenteric artery. An angiogram was performed. No obvious communication with the IMA. No evidence of active bleeding. A renegade STC microcatheter was advanced over a Fathom 16 wire and used to select a proximal branch arising from the IMA. Arteriography was performed. This is a jejunal branch with normal opacification of the jejunal mucosa. No evidence of active bleeding. No communication with the IMA territory. The microcatheter was used to select another branch artery. Contrast injection demonstrates filling of the redundant transverse colon. This appears to be the left middle colic artery colic artery. No evidence of active bleeding. The microcatheter was advanced further into the unnamed branch supplying the distal transverse  colon. Super selective arteriography was performed. Again, no evidence of active bleeding. No communication with the main IMA territory. At this time, the decision was made to go back to attempting to catheterize the inferior mesenteric artery as this would  provide the arterial supply to the region of active bleeding seen on the recent CT arteriogram. The Sos microcatheter was reintroduced. Additional arteriography was performed in multiple obliquities in till the origin of the IMA was successfully identified. Due to stenosis at the origin, the catheter tip could not be engaged in the artery. However, the catheter was positioned just outside the arterial ostium and ultimately, the renegade STC microcatheter and Fathom wire were successfully floated into the inferior mesenteric artery. Arteriography was then performed. No evidence of active bleeding on initial angiography. Due to the heavily stenotic origin, opacification of the arteries is somewhat limited. Therefore, the microcatheter was advanced more distally into a sigmoid branch of the IMA. Contrast injection was performed and demonstrates no evidence of active bleeding. The microcatheter was next advanced into the left colic artery. Contrast injection was performed. Again, no evidence of active bleeding. The patient's diverticular bleed appears to have spontaneously stopped. Catheters were removed. Hemostasis was attained with the aid of a Celt closure device. IMPRESSION: 1. Heavily diseased and calcified arteries. Catheterization of the inferior mesenteric artery was extremely challenging but ultimately successful. 2. No evidence of active bleeding at the time of arteriography. Signed, Sterling Big, MD, RPVI [facility] Radiology Electronically Signed   By: Malachy Moan M.D.   On: 02/18/2020 08:29   IR Angiogram Selective Each Additional Vessel  Result Date: 02/18/2020 INDICATION: 87 year old  female with acute sigmoid diverticular bleed. She presents for arteriogram and possible embolization. EXAM: SELECTIVE VISCERAL ARTERIOGRAPHY; IR ULTRASOUND GUIDANCE VASC ACCESS RIGHT; ADDITIONAL ARTERIOGRAPHY MEDICATIONS: None. ANESTHESIA/SEDATION: 1 mg Versed administered for anxiolysis. CONTRAST:  30mL OMNIPAQUE IOHEXOL 300 MG/ML SOLN, OMNIPAQUE IOHEXOL 300 MG/ML SOLN, 30mL OMNIPAQUE IOHEXOL 300 MG/ML SOLN, 30mL OMNIPAQUE IOHEXOL 300 MG/ML SOLN FLUOROSCOPY TIME:  Fluoroscopy Time: 25 minutes 12 seconds (1901 mGy). COMPLICATIONS: None immediate. PROCEDURE: Informed consent was obtained from the patient following explanation of the procedure, risks, benefits and alternatives. The patient understands, agrees and consents for the procedure. All questions were addressed. A time out was performed prior to the initiation of the procedure. Maximal barrier sterile technique utilized including caps, mask, sterile gowns, sterile gloves, large sterile drape, hand hygiene, and Betadine prep. The right common femoral artery was interrogated with ultrasound and found to be widely patent. An image was obtained and stored for the medical record. Local anesthesia was attained by infiltration with 1% lidocaine. A small dermatotomy was made. Under real-time sonographic guidance, the vessel was punctured with a 21 gauge micropuncture needle. Using standard technique, the initial micro needle was exchanged over a 0.018 micro wire for a transitional 4 [place] micro sheath. The micro sheath was then exchanged over a 0.035 wire for a 5 French vascular sheath. Initially, a RIM catheter was introduced into the abdominal aorta over a Bentson wire. The aorta is tortuous and variant in caliber with portions that are ectatic, and portions that are small. Heavily calcified atherosclerotic plaque noted throughout. The origin of the inferior mesenteric artery could not be engaged with this catheter. Therefore, the catheter was exchanged for a  Mickelson catheter. The Mickelson catheter was formed. Again, the origin of the IMA could not be successfully engaged. The Mickelson catheter was then exchanged for a Sos Omni selective catheter. Despite multiple attempts, the origin of the IMA could not be successfully identified through the heavily calcified plaque. At this point, the decision was made to pursue angiography of the superior mesenteric artery to see if there is  a communication into the IMA territory via the middle colic artery. A C2 cobra catheter was advanced over the Bentson wire and used to select the superior mesenteric artery. An angiogram was performed. No obvious communication with the IMA. No evidence of active bleeding. A renegade STC microcatheter was advanced over a Fathom 16 wire and used to select a proximal branch arising from the IMA. Arteriography was performed. This is a jejunal branch with normal opacification of the jejunal mucosa. No evidence of active bleeding. No communication with the IMA territory. The microcatheter was used to select another branch artery. Contrast injection demonstrates filling of the redundant transverse colon. This appears to be the left middle colic artery colic artery. No evidence of active bleeding. The microcatheter was advanced further into the unnamed branch supplying the distal transverse colon. Super selective arteriography was performed. Again, no evidence of active bleeding. No communication with the main IMA territory. At this time, the decision was made to go back to attempting to catheterize the inferior mesenteric artery as this would provide the arterial supply to the region of active bleeding seen on the recent CT arteriogram. The Sos microcatheter was reintroduced. Additional arteriography was performed in multiple obliquities in till the origin of the IMA was successfully identified. Due to stenosis at the origin, the catheter tip could not be engaged in the artery. However, the catheter  was positioned just outside the arterial ostium and ultimately, the renegade STC microcatheter and Fathom wire were successfully floated into the inferior mesenteric artery. Arteriography was then performed. No evidence of active bleeding on initial angiography. Due to the heavily stenotic origin, opacification of the arteries is somewhat limited. Therefore, the microcatheter was advanced more distally into a sigmoid branch of the IMA. Contrast injection was performed and demonstrates no evidence of active bleeding. The microcatheter was next advanced into the left colic artery. Contrast injection was performed. Again, no evidence of active bleeding. The patient's diverticular bleed appears to have spontaneously stopped. Catheters were removed. Hemostasis was attained with the aid of a Celt closure device. IMPRESSION: 1. Heavily diseased and calcified arteries. Catheterization of the inferior mesenteric artery was extremely challenging but ultimately successful. 2. No evidence of active bleeding at the time of arteriography. Signed, Sterling Big, MD, RPVI [facility] Radiology Electronically Signed   By: Malachy Moan M.D.   On: 02/18/2020 08:29   IR US Guide Vasc Access Right  Result Date: 02/18/2020 INDICATION: 87 year old female with acute sigmoid diverticular bleed. She presents for arteriogram and possible embolization. EXAM: SELECTIVE VISCERAL ARTERIOGRAPHY; IR ULTRASOUND GUIDANCE VASC ACCESS RIGHT; ADDITIONAL ARTERIOGRAPHY MEDICATIONS: None. ANESTHESIA/SEDATION: 1 mg Versed administered for anxiolysis. CONTRAST:  30mL OMNIPAQUE IOHEXOL 300 MG/ML SOLN, OMNIPAQUE IOHEXOL 300 MG/ML SOLN, 30mL OMNIPAQUE IOHEXOL 300 MG/ML SOLN, 30mL OMNIPAQUE IOHEXOL 300 MG/ML SOLN FLUOROSCOPY TIME:  Fluoroscopy Time: 25 minutes 12 seconds (1901 mGy). COMPLICATIONS: None immediate. PROCEDURE: Informed consent was obtained from the patient following explanation of  the procedure, risks, benefits and alternatives. The patient understands, agrees and consents for the procedure. All questions were addressed. A time out was performed prior to the initiation of the procedure. Maximal barrier sterile technique utilized including caps, mask, sterile gowns, sterile gloves, large sterile drape, hand hygiene, and Betadine prep. The right common femoral artery was interrogated with ultrasound and found to be widely patent. An image was obtained and stored for the medical record. Local anesthesia was attained by infiltration with 1% lidocaine. A small dermatotomy was  made. Under real-time sonographic guidance, the vessel was punctured with a 21 gauge micropuncture needle. Using standard technique, the initial micro needle was exchanged over a 0.018 micro wire for a transitional 4 [place] micro sheath. The micro sheath was then exchanged over a 0.035 wire for a 5 French vascular sheath. Initially, a RIM catheter was introduced into the abdominal aorta over a Bentson wire. The aorta is tortuous and variant in caliber with portions that are ectatic, and portions that are small. Heavily calcified atherosclerotic plaque noted throughout. The origin of the inferior mesenteric artery could not be engaged with this catheter. Therefore, the catheter was exchanged for a Mickelson catheter. The Mickelson catheter was formed. Again, the origin of the IMA could not be successfully engaged. The Mickelson catheter was then exchanged for a Sos Omni selective catheter. Despite multiple attempts, the origin of the IMA could not be successfully identified through the heavily calcified plaque. At this point, the decision was made to pursue angiography of the superior mesenteric artery to see if there is a communication into the IMA territory via the middle colic artery. A C2 cobra catheter was advanced over the Bentson wire and used to select the superior mesenteric artery. An angiogram was performed. No  obvious communication with the IMA. No evidence of active bleeding. A renegade STC microcatheter was advanced over a Fathom 16 wire and used to select a proximal branch arising from the IMA. Arteriography was performed. This is a jejunal branch with normal opacification of the jejunal mucosa. No evidence of active bleeding. No communication with the IMA territory. The microcatheter was used to select another branch artery. Contrast injection demonstrates filling of the redundant transverse colon. This appears to be the left middle colic artery colic artery. No evidence of active bleeding. The microcatheter was advanced further into the unnamed branch supplying the distal transverse colon. Super selective arteriography was performed. Again, no evidence of active bleeding. No communication with the main IMA territory. At this time, the decision was made to go back to attempting to catheterize the inferior mesenteric artery as this would provide the arterial supply to the region of active bleeding seen on the recent CT arteriogram. The Sos microcatheter was reintroduced. Additional arteriography was performed in multiple obliquities in till the origin of the IMA was successfully identified. Due to stenosis at the origin, the catheter tip could not be engaged in the artery. However, the catheter was positioned just outside the arterial ostium and ultimately, the renegade STC microcatheter and Fathom wire were successfully floated into the inferior mesenteric artery. Arteriography was then performed. No evidence of active bleeding on initial angiography. Due to the heavily stenotic origin, opacification of the arteries is somewhat limited. Therefore, the microcatheter was advanced more distally into a sigmoid branch of the IMA. Contrast injection was performed and demonstrates no evidence of active bleeding. The microcatheter was next advanced into the left colic artery. Contrast injection was performed. Again, no  evidence of active bleeding. The patient's diverticular bleed appears to have spontaneously stopped. Catheters were removed. Hemostasis was attained with the aid of a Celt closure device. IMPRESSION: 1. Heavily diseased and calcified arteries. Catheterization of the inferior mesenteric artery was extremely challenging but ultimately successful. 2. No evidence of active bleeding at the time of arteriography. Signed, Sterling Big, MD, RPVI [facility] Radiology Electronically Signed   By: Malachy Moan M.D.   On: 02/18/2020 08:29   CT Angio Abd/Pel w/ and/or w/o  Addendum Date: 02/17/2020   ADDENDUM REPORT: 02/17/2020 18:33 ADDENDUM: Critical Value/emergent results were called by telephone at the time of interpretation on 02/17/2020 at 6:32 pm to provider Dr. Tyson Babinski, Who verbally acknowledged these results. Electronically Signed   By: Lupita Raider M.D.   On: 02/17/2020 18:33   Result Date: 02/17/2020 CLINICAL DATA:  Rectal bleeding.  Anemia. EXAM: CTA ABDOMEN AND PELVIS WITHOUT AND WITH CONTRAST TECHNIQUE: Multidetector CT imaging of the abdomen and pelvis was performed using the standard protocol during bolus administration of intravenous contrast. Multiplanar reconstructed images and MIPs were obtained and reviewed to evaluate the vascular anatomy. CONTRAST:  OMNIPAQUE IOHEXOL 350 MG/ML SOLN COMPARISON:  None. FINDINGS: VASCULAR Aorta: Atherosclerosis of thoracic aorta is noted without aneurysm or dissection. Celiac: Patent without evidence of aneurysm, dissection, vasculitis or significant stenosis. SMA: Patent without evidence of aneurysm, dissection, vasculitis or significant stenosis. Renals: 2 right renal arteries are noted which are widely patent without significant stenosis. However, heavily calcified plaque is seen involving the proximal portion of the left renal artery resulting in moderate to severe focal stenosis. IMA: Patent without  evidence of aneurysm, dissection, vasculitis or significant stenosis. Inflow: Common iliac arteries bilaterally are widely patent. However, multiple moderate stenoses are noted in the right external iliac artery secondary to calcified plaque. Multiple moderate stenoses are also noted in the left external iliac artery secondary to calcified plaque. Proximal Outflow: Moderate to severe narrowing is seen involving the visualized portions of the common and superficial femoral arteries bilaterally secondary to calcified plaque. Veins: No obvious venous abnormality within the limitations of this arterial phase study. Review of the MIP images confirms the above findings. NON-VASCULAR Lower chest: Small left pleural effusion is noted with adjacent subsegmental atelectasis. Hepatobiliary: No gallstones or biliary dilatation is noted. Left hepatic cyst is noted. Pancreas: Unremarkable. No pancreatic ductal dilatation or surrounding inflammatory changes. Spleen: Normal in size without focal abnormality. Adrenals/Urinary Tract: Adrenal glands are unremarkable. Kidneys are normal, without renal calculi, focal lesion, or hydronephrosis. Bladder is unremarkable. Stomach/Bowel: Stomach is within normal limits. Appendix appears normal. There is no evidence of bowel obstruction. However, there is seen a small focus of extravasation of contrast in a portion of the proximal sigmoid colon best seen on image number 101 of series 7. This is consistent with active gastrointestinal hemorrhage. Lymphatic: No adenopathy is noted. Reproductive: Uterus and bilateral adnexa are unremarkable. Other: No abdominal wall hernia or abnormality. No abdominopelvic ascites. Musculoskeletal: No acute or significant osseous findings. IMPRESSION: Small focus of extravasation of contrast is seen in a portion of the proximal sigmoid colon consistent with active gastrointestinal hemorrhage. These results will be called to the ordering clinician or  representative by the Radiologist Assistant, and communication documented in the PACS or zVision Dashboard. Severe stenosis is seen involving proximal portion of left renal artery secondary to calcified plaque. Multiple moderate to severe stenoses are noted in both external iliac arteries as well as the proximal femoral artery secondary to calcified plaque. Small left pleural effusion is noted with adjacent subsegmental atelectasis. Aortic Atherosclerosis (ICD10-I70.0). Electronically Signed: By: Lupita Raider M.D. On: 02/17/2020 18:24     UPPER ENDOSCOPY (02/17/2019) Impression: - Non-bleeding gastric ulcers with no stigmata of  bleeding. Biopsied. - ?Mild Candida esophagitis (brushed) Moderate Sedation: Not Applicable - Patient had care per Anesthesia. Recommendation: - Return patient to hospital ward for ongoing care. - Advance diet as tolerated. - Continue Protonix 40 mg p.o. once a day. - Await pathology results.  If brushings are  positive for Candida, will call in Diflucan. - Rinse/gargle after steroid inhalers. - Patient was not keen on getting colonoscopy  performed. - Avoid nonsteroidals, if possible. - Trend CBC. If Hb stable in a.m. and no further  bleeding, can D/C to SNF. - The findings and recommendations were discussed  with the patient.   Subjective: No issues this morning.  Discharge Exam: Vitals:   02/21/20 0540 02/21/20 0752  BP: (!) 145/61   Pulse: 64   Resp: 14   Temp: 97.8 F (36.6 C)   SpO2: 93% 95%   Vitals:   02/20/20 1353 02/20/20 2021 02/21/20 0540 02/21/20 0752   BP: (!) 133/58 140/65 (!) 145/61   Pulse: 67 67 64   Resp: 14 14 14    Temp: 98.1 F (36.7 C) 98.2 F (36.8 C) 97.8 F (36.6 C)   TempSrc: Oral Oral Oral   SpO2: 94% 95% 93% 95%  Weight:      Height:        General: Pt is alert, awake, not in acute distress Cardiovascular: RRR, S1/S2 +, no rubs, no gallops Respiratory: CTA bilaterally, no wheezing, no rhonchi Abdominal: Soft, NT, ND, bowel sounds + Extremities: no edema, no cyanosis    The results of significant diagnostics from this hospitalization (including imaging, microbiology, ancillary and laboratory) are listed below for reference.     Microbiology: Recent Results (from the past 240 hour(s))  Resp Panel by RT-PCR (Flu A&B, Covid) Nasopharyngeal Swab     Status: None   Collection Time: 02/16/20  7:00 AM   Specimen: Nasopharyngeal Swab; Nasopharyngeal(NP) swabs in vial transport medium  Result Value Ref Range Status   SARS Coronavirus 2 by RT PCR NEGATIVE NEGATIVE Final    Comment: (NOTE) SARS-CoV-2 target nucleic acids are NOT DETECTED.  The SARS-CoV-2 RNA is generally detectable in upper respiratory specimens during the acute phase of infection. The lowest concentration of SARS-CoV-2 viral copies this assay can detect is 138 copies/mL. A negative result does not preclude SARS-Cov-2 infection and should not be used as the sole basis for treatment or other patient management decisions. A negative result may occur with  improper specimen collection/handling, submission of specimen other than nasopharyngeal swab, presence of viral mutation(s) within the areas targeted by this assay, and inadequate number of viral copies(<138 copies/mL). A negative result must be combined with clinical observations, patient history, and epidemiological information. The expected result is Negative.  Fact Sheet for Patients:  BloggerCourse.com  Fact Sheet for Healthcare Providers:   SeriousBroker.it  This test is no t yet approved or cleared by the [place] FDA and  has been authorized for detection and/or diagnosis of SARS-CoV-2 by FDA under an Emergency Use Authorization (EUA). This EUA will remain  in effect (meaning this test can be used) for the duration of the COVID-19 declaration under Section 564(b)(1) of the Act, 21 U.S.C.section 360bbb-3(b)(1), unless the authorization is terminated  or revoked sooner.       Influenza A by PCR NEGATIVE NEGATIVE Final   Influenza B by PCR NEGATIVE NEGATIVE Final    Comment: (NOTE) The Xpert Xpress SARS-CoV-2/FLU/RSV plus assay is intended as an aid in the diagnosis of influenza from Nasopharyngeal swab specimens and should not be used as a sole basis for treatment. Nasal washings and aspirates are unacceptable for Xpert Xpress SARS-CoV-2/FLU/RSV testing.  Fact Sheet for Patients: BloggerCourse.com  Fact Sheet for Healthcare Providers: SeriousBroker.it  This test is not yet approved or cleared by the  [place] [employer] and has been authorized for detection and/or diagnosis of SARS-CoV-2 by FDA under an TEFL teacher (EUA). This EUA will remain in effect (meaning this test can be used) for the duration of the COVID-19 declaration under Section 564(b)(1) of the Act, 21 U.S.C. section 360bbb-3(b)(1), unless the authorization is terminated or revoked.  Performed at [facility], [address], [place], [place] [address]   MRSA PCR Screening     Status: None   Collection Time: 02/17/20  9:41 PM   Specimen: Nasopharyngeal  Result Value Ref Range Status   MRSA by PCR NEGATIVE NEGATIVE Final    Comment:        The GeneXpert MRSA Assay (FDA approved for NASAL specimens only), is one component of a comprehensive MRSA colonization surveillance program. It is not intended to diagnose MRSA infection nor  to guide or monitor treatment for MRSA infections. Performed at [facility], [address], [place], [place] [address]   SARS CORONAVIRUS 2 (TAT 6-24 HRS) Nasopharyngeal Nasopharyngeal Swab     Status: None   Collection Time: 02/20/20  3:04 PM   Specimen: Nasopharyngeal Swab  Result Value Ref Range Status   SARS Coronavirus 2 NEGATIVE NEGATIVE Final    Comment: (NOTE) SARS-CoV-2 target nucleic acids are NOT DETECTED.  The SARS-CoV-2 RNA is generally detectable in upper and lower respiratory specimens during the acute phase of infection. Negative results do not preclude SARS-CoV-2 infection, do not rule out co-infections with other pathogens, and should not be used as the sole basis for treatment or other patient management decisions. Negative results must be combined with clinical observations, patient history, and epidemiological information. The expected result is Negative.  Fact Sheet for Patients: HairSlick.no  Fact Sheet for Healthcare Providers: quierodirigir.com  This test is not yet approved or cleared by the [place] FDA and  has been authorized for detection and/or diagnosis of SARS-CoV-2 by FDA under an Emergency Use Authorization (EUA). This EUA will remain  in effect (meaning this test can be used) for the duration of the COVID-19 declaration under Se ction 564(b)(1) of the Act, 21 U.S.C. section 360bbb-3(b)(1), unless the authorization is terminated or revoked sooner.  Performed at [facility], [address], [place], [place] [address]      Labs: BNP (last 3 results) No results for input(s): BNP in the last 8760 hours. Basic Metabolic Panel: Recent Labs  Lab 02/16/20 0408 02/17/20 0526 02/18/20 0115 02/19/20 0254 02/20/20 0909  NA 138 142 136 139 140  K 4.2 3.8 3.7 3.4* 3.4*  CL 106 111 108 108 108  CO2 24 24 21* 23 24  GLUCOSE 140* 104* 122* 125* 104*  BUN 26*  22 16 19 15   CREATININE 0.96 0.57 0.58 0.65 0.71  CALCIUM 8.9 8.5* 7.8* 8.5* 8.2*   Liver Function Tests: Recent Labs  Lab 02/16/20 0408  AST 18  ALT 15  ALKPHOS 72  BILITOT 0.6  PROT 6.0*  ALBUMIN 3.8   No results for input(s): LIPASE, AMYLASE in the last 168 hours. No results for input(s): AMMONIA in the last 168 hours. CBC: Recent Labs  Lab 02/16/20 0408 02/16/20 0550 02/17/20 0526 02/17/20 1528 02/18/20 0115 02/19/20 0254 02/20/20 0909  WBC 8.1  --  8.3  --  9.8 7.5 6.1  HGB 9.8*   < > 7.6* 7.7* 7.7* 6.8* 7.7*  HCT 30.1*   < > 23.4* 24.5* 23.2* 20.7* 23.1*  MCV 94.7  --  93.6  --  92.4 92.8 87.8  PLT 218  --  184  --  165 156 158   < > = values in this interval not displayed.   Cardiac Enzymes: No results for input(s): CKTOTAL, CKMB, CKMBINDEX, TROPONINI in the last 168 hours. BNP: Invalid input(s): POCBNP CBG: No results for input(s): GLUCAP in the last 168 hours. D-Dimer No results for input(s): DDIMER in the last 72 hours. Hgb A1c No results for input(s): HGBA1C in the last 72 hours. Lipid Profile No results for input(s): CHOL, HDL, LDLCALC, TRIG, CHOLHDL, LDLDIRECT in the last 72 hours. Thyroid function studies No results for input(s): TSH, T4TOTAL, T3FREE, THYROIDAB in the last 72 hours.  Invalid input(s): FREET3 Anemia work up No results for input(s): VITAMINB12, FOLATE, FERRITIN, TIBC, IRON, RETICCTPCT in the last 72 hours. Urinalysis    Component Value Date/Time   COLORURINE YELLOW 10/24/2014 1117   APPEARANCEUR CLEAR 10/24/2014 1117   LABSPEC 1.010 10/24/2014 1117   PHURINE 6.0 10/24/2014 1117   GLUCOSEU NEGATIVE 10/24/2014 1117   HGBUR NEGATIVE 10/24/2014 1117   BILIRUBINUR NEGATIVE 10/24/2014 1117   KETONESUR NEGATIVE 10/24/2014 1117   PROTEINUR 5.6 07/26/2017 0000   PROTEINUR 30 (A) 08/23/2013 1631   UROBILINOGEN 0.2 10/24/2014 1117   NITRITE NEGATIVE 10/24/2014 1117   LEUKOCYTESUR TRACE (A) 10/24/2014 1117   Sepsis Labs Invalid  input(s): PROCALCITONIN,  WBC,  LACTICIDVEN Microbiology Recent Results (from the past 240 hour(s))  Resp Panel by RT-PCR (Flu A&B, Covid) Nasopharyngeal Swab     Status: None   Collection Time: 02/16/20  7:00 AM   Specimen: Nasopharyngeal Swab; Nasopharyngeal(NP) swabs in vial transport medium  Result Value Ref Range Status   SARS Coronavirus 2 by RT PCR NEGATIVE NEGATIVE Final    Comment: (NOTE) SARS-CoV-2 target nucleic acids are NOT DETECTED.  The SARS-CoV-2 RNA is generally detectable in upper respiratory specimens during the acute phase of infection. The lowest concentration of SARS-CoV-2 viral copies this assay can detect is 138 copies/mL. A negative result does not preclude SARS-Cov-2 infection and should not be used as the sole basis for treatment or other patient management decisions. A negative result may occur with  improper specimen collection/handling, submission of specimen other than nasopharyngeal swab, presence of viral mutation(s) within the areas targeted by this assay, and inadequate number of viral copies(<138 copies/mL). A negative result must be combined with clinical observations, patient history, and epidemiological information. The expected result is Negative.  Fact Sheet for Patients:  BloggerCourse.com  Fact Sheet for Healthcare Providers:  SeriousBroker.it  This test is no t yet approved or cleared by the [place] FDA and  has been authorized for detection and/or diagnosis of SARS-CoV-2 by FDA under an Emergency Use Authorization (EUA). This EUA will remain  in effect (meaning this test can be used) for the duration of the COVID-19 declaration under Section 564(b)(1) of the Act, 21 U.S.C.section 360bbb-3(b)(1), unless the authorization is terminated  or revoked sooner.       Influenza A by PCR NEGATIVE NEGATIVE Final   Influenza B by PCR NEGATIVE NEGATIVE Final    Comment: (NOTE) The Xpert  Xpress SARS-CoV-2/FLU/RSV plus assay is intended as an aid in the diagnosis of influenza from Nasopharyngeal swab specimens and should not be used as a sole basis for treatment. Nasal washings and aspirates are unacceptable for Xpert Xpress SARS-CoV-2/FLU/RSV testing.  Fact Sheet for Patients: BloggerCourse.com  Fact Sheet for Healthcare Providers: SeriousBroker.it  This test is not yet approved or cleared  by the [place] and has been authorized for detection and/or diagnosis of SARS-CoV-2 by FDA under an Emergency Use Authorization (EUA). This EUA will remain in effect (meaning this test can be used) for the duration of the COVID-19 declaration under Section 564(b)(1) of the Act, 21 U.S.C. section 360bbb-3(b)(1), unless the authorization is terminated or revoked.  Performed at [facility], [address], [place], [place] [address]   MRSA PCR Screening     Status: None   Collection Time: 02/17/20  9:41 PM   Specimen: Nasopharyngeal  Result Value Ref Range Status   MRSA by PCR NEGATIVE NEGATIVE Final    Comment:        The GeneXpert MRSA Assay (FDA approved for NASAL specimens only), is one component of a comprehensive MRSA colonization surveillance program. It is not intended to diagnose MRSA infection nor to guide or monitor treatment for MRSA infections. Performed at [facility], [address] [place], [place] [address]   SARS CORONAVIRUS 2 (TAT 6-24 HRS) Nasopharyngeal Nasopharyngeal Swab     Status: None   Collection Time: 02/20/20  3:04 PM   Specimen: Nasopharyngeal Swab  Result Value Ref Range Status   SARS Coronavirus 2 NEGATIVE NEGATIVE Final    Comment: (NOTE) SARS-CoV-2 target nucleic acids are NOT DETECTED.  The SARS-CoV-2 RNA is generally detectable in upper and lower respiratory specimens during the acute phase of infection. Negative results do not  preclude SARS-CoV-2 infection, do not rule out co-infections with other pathogens, and should not be used as the sole basis for treatment or other patient management decisions. Negative results must be combined with clinical observations, patient history, and epidemiological information. The expected result is Negative.  Fact Sheet for Patients: HairSlick.no  Fact Sheet for Healthcare Providers: quierodirigir.com  This test is not yet approved or cleared by the [place] FDA and  has been authorized for detection and/or diagnosis of SARS-CoV-2 by FDA under an Emergency Use Authorization (EUA). This EUA will remain  in effect (meaning this test can be used) for the duration of the COVID-19 declaration under Se ction 564(b)(1) of the Act, 21 U.S.C. section 360bbb-3(b)(1), unless the authorization is terminated or revoked sooner.  Performed at [facility], [address] [place], [place] [address]      Time coordinating discharge: 35 minutes  SIGNED:   Jacquelin Hawking, MD [facility] 02/21/2020, 10:21 AM

## 2020-02-21 NOTE — Discharge Instructions (Signed)
Gastrointestinal Bleeding Gastrointestinal (GI) bleeding is bleeding somewhere along the digestive tract, between the mouth and the anus. This tract includes the mouth, esophagus, stomach, small intestine, large intestine, and anus. The large intestine is often called the colon. GI bleeding can be caused by various problems. The severity of these problems can range from mild to serious or even life-threatening. If you have GI bleeding, you may find blood in your stools (feces), you may have black stools, or you may vomit blood. If there is a lot of bleeding, you may need to stay in the hospital. What are the causes? This condition may be caused by:  Inflammation, irritation, or swelling of the esophagus (esophagitis). The esophagus is part of the body that moves food from your mouth to your stomach.  Swollen veins in the rectum (hemorrhoids).  Areas of painful tearing in the anus that are often caused by passing hard stool (anal fissures).  Pouches that form on the colon over time, with age, and may bleed a lot (diverticulosis).  Inflammation (diverticulitis) in areas with diverticulosis. This can cause pain, fever, and bloody stools, although bleeding may be mild.  Growths (polyps) or cancer. Colon cancer often starts out as precancerous polyps.  Gastritis and ulcers. With these, bleeding may come from the upper GI tract, near the stomach. What increases the risk? You are more likely to develop this condition if you:  Have an infection in your stomach from a type of bacteria called Helicobacter pylori.  Take certain medicines, such as: ? NSAIDs. ? Aspirin. ? Selective serotonin reuptake inhibitors (SSRIs). ? Steroids. ? Antiplatelet or anticoagulant medicines.  Smoke.  Drink alcohol. What are the signs or symptoms? Common symptoms of this condition include:  Bright red blood in your vomit, or vomit that looks like coffee grounds.  Bloody, black, or tarry stools. ? Bleeding  from the lower GI tract will usually cause red or maroon blood in the stools. ? Bleeding from the upper GI tract may cause black, tarry stools that are often stronger smelling than usual. ? In certain cases, if the bleeding is fast enough, the stools may be red.  Pain or cramping in the abdomen. How is this diagnosed? This condition may be diagnosed based on:  Your medical history and a physical exam.  Various tests, such as: ? Blood tests. ? Stool tests. ? X-rays and other imaging tests. ? Esophagogastroduodenoscopy (EGD). In this test, a flexible, lighted tube is used to look at your esophagus, stomach, and small intestine. ? Colonoscopy. In this test, a flexible, lighted tube is used to look at your colon. How is this treated? Treatment for this condition depends on the cause of the bleeding. For example:  For bleeding from the esophagus, stomach, small intestine, or colon, the health care provider doing your EGD or colonoscopy may be able to stop the bleeding as part of the procedure.  Inflammation or infection of the colon can be treated with medicines.  Certain rectal problems can be treated with creams, suppositories, or warm baths.  Medicines may be given to reduce acid in your stomach.  Surgery is sometimes needed.  Blood transfusions are sometimes needed if a lot of blood has been lost. If bleeding is mild, you may be allowed to go home. If there is a lot of bleeding, you will need to stay in the hospital for observation. Follow these instructions at home:   Take over-the-counter and prescription medicines only as told by your health care provider.    Eat foods that are high in fiber, such as beans, whole grains, and fresh fruits and vegetables. This will help to keep your stools soft. Eating 1-3 prunes each day works well for many people.  Drink enough fluid to keep your urine pale yellow.  Keep all follow-up visits as told by your health care provider. This is  important. Contact a health care provider if:  Your symptoms do not improve. Get help right away if:  Your bleeding does not stop.  You feel light-headed or you faint.  You feel weak.  You have severe cramps in your back or abdomen.  You pass large blood clots in your stool.  Your symptoms are getting worse.  You have chest pain or fast heartbeats. Summary  Gastrointestinal (GI) bleeding is bleeding somewhere along the digestive tract, between the mouth and anus. GI bleeding can be caused by various problems. The severity of these problems can range from mild to serious or even life-threatening.  Treatment for this condition depends on the cause of the bleeding.  Take over-the-counter and prescription medicines only as told by your health care provider.  Keep all follow-up visits as told by your health care provider. This is important.  Get help right away if your bleeding increases, your symptoms are getting worse, or you have new symptoms. This information is not intended to replace advice given to you by your health care provider. Make sure you discuss any questions you have with your health care provider. Document Revised: 09/14/2017 Document Reviewed: 09/14/2017 Elsevier Patient Education  2020 [employer]  

## 2020-02-22 ENCOUNTER — Telehealth: Payer: Self-pay | Admitting: Gastroenterology

## 2020-02-22 ENCOUNTER — Non-Acute Institutional Stay: Payer: Medicare Other | Admitting: Internal Medicine

## 2020-02-22 ENCOUNTER — Encounter: Payer: Self-pay | Admitting: Internal Medicine

## 2020-02-22 ENCOUNTER — Encounter: Payer: Self-pay | Admitting: Nurse Practitioner

## 2020-02-22 ENCOUNTER — Other Ambulatory Visit: Payer: Self-pay | Admitting: Gastroenterology

## 2020-02-22 DIAGNOSIS — M159 Polyosteoarthritis, unspecified: Secondary | ICD-10-CM

## 2020-02-22 DIAGNOSIS — I10 Essential (primary) hypertension: Secondary | ICD-10-CM | POA: Diagnosis not present

## 2020-02-22 DIAGNOSIS — K922 Gastrointestinal hemorrhage, unspecified: Secondary | ICD-10-CM | POA: Diagnosis not present

## 2020-02-22 DIAGNOSIS — R531 Weakness: Secondary | ICD-10-CM | POA: Diagnosis not present

## 2020-02-22 DIAGNOSIS — D649 Anemia, unspecified: Secondary | ICD-10-CM | POA: Diagnosis not present

## 2020-02-22 DIAGNOSIS — M8949 Other hypertrophic osteoarthropathy, multiple sites: Secondary | ICD-10-CM | POA: Diagnosis not present

## 2020-02-22 DIAGNOSIS — K5791 Diverticulosis of intestine, part unspecified, without perforation or abscess with bleeding: Secondary | ICD-10-CM

## 2020-02-22 DIAGNOSIS — J449 Chronic obstructive pulmonary disease, unspecified: Secondary | ICD-10-CM | POA: Diagnosis not present

## 2020-02-22 DIAGNOSIS — B3781 Candidal esophagitis: Secondary | ICD-10-CM | POA: Diagnosis not present

## 2020-02-22 DIAGNOSIS — R059 Cough, unspecified: Secondary | ICD-10-CM

## 2020-02-22 DIAGNOSIS — K2900 Acute gastritis without bleeding: Secondary | ICD-10-CM

## 2020-02-22 DIAGNOSIS — J4489 Other specified chronic obstructive pulmonary disease: Secondary | ICD-10-CM

## 2020-02-22 DIAGNOSIS — R2681 Unsteadiness on feet: Secondary | ICD-10-CM | POA: Diagnosis not present

## 2020-02-22 DIAGNOSIS — M150 Primary generalized (osteo)arthritis: Secondary | ICD-10-CM

## 2020-02-22 DIAGNOSIS — M6281 Muscle weakness (generalized): Secondary | ICD-10-CM | POA: Diagnosis not present

## 2020-02-22 DIAGNOSIS — R5383 Other fatigue: Secondary | ICD-10-CM | POA: Diagnosis not present

## 2020-02-22 MED ORDER — FLUCONAZOLE 200 MG PO TABS: ORAL_TABLET | ORAL | 0 refills | Status: DC

## 2020-02-22 NOTE — Telephone Encounter (Signed)
Cancelled out prescription to [facility] reordered prescription to [facility]

## 2020-02-22 NOTE — Progress Notes (Unsigned)
Location:   [place] Room Number: [id number] Place of Service:  ALF [phone]) Provider:  Veleta Miners MD  Mast, Man X, NP  Patient Care Team: Mast, Man X, NP as PCP - General (Internal Medicine) Mast, Man X, NP as Nurse Practitioner (Internal Medicine)  Extended Emergency Contact Information Primary Emergency Contact: Culbreth,Robin Address: [address], [place] [address] [place] Phone: [phone] Mobile Phone: [phone] Relation: Daughter  Code Status:  Full Code Goals of care: Advanced Directive information Advanced Directives 02/16/2020  Does Patient Have a Medical Advance Directive? Yes  Type of Paramedic of [place];Living will  Does patient want to make changes to medical advance directive? No - Patient declined  Copy of [place] in Chart? No - copy requested  Would patient like information on creating a medical advance directive? -  Pre-existing out of facility DNR order (yellow form or pink MOST form) -     Chief Complaint  Patient presents with  . Acute Visit    Hospital follow up    HPI:  Pt is a 87 y.o. female seen today for an acute visit for Hospital Follow up and Meds review  Patient was admitted from 1/1 - 1/6  for acute GI bleed   Patient is long term resident of AL unit in [place]. Patient has h/ofibromyalgia on Lyrica hypertension, hyperlipidemia anemia, osteoporosis, COPD , Insomniaand Tactile hallucinations but patient refuses to take any Meds for it Chronic nonsteroidal use  Was sent to ED bright red blood per rectum with multiple episodes. She was admitted had endoscopy done which showed nonbleeding ulcers and Candida esophagitis. Patient had an acute episode in which her hgb dropped to 6.8.  IR was consulted They were able to find the site of bleeding and success fully catherize right inferior mesenteric artery but bleeding   stopped spontaneously. Patient was transfused PRBC. She did not have any more bleeding since then .  Now is in AL for therapy and long-term care She states she continues to feel weak.  Has poor appetite.  But no BRBPR Is already walking with her walker.  Denies any abdominal pain mild nausea persist.  Past Medical History:  Diagnosis Date  . Acute blood loss anemia 08/23/2013   04/13/16 TSH 1.20, Na 141, K 4.2, Bun 15, creat 0.79, wbc 5.5, Hgb 11.5, plt 283  . Anxiety   . Anxiety state 07/02/2007   Qualifier: Diagnosis of  By: Lenna Gilford MD, Deborra Medina   . Asthma   . 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 Cigarette smoker   . Colon polyps 2009   TWO TUBULAR ADENOMAS AND HYPERPLASTIC POLYPS  . Constipation 09/14/2013  . COPD (chronic obstructive pulmonary disease) ([place])   . Diverticulosis of colon   . DJD (degenerative joint disease)   . Dog bite of limb 07/14/2010   Dog bite 07/10/10 - dogs shots utd Last td was 08/2009  Localized tx only.    . Fibromyalgia   . GERD 12/19/2006   Qualifier: Diagnosis of  By: Julien Girt CMA, Marliss Czar  04/13/16 TSH 1.20, Na 141, K 4.2, Bun 15, creat 0.79, wbc 5.5, Hgb 11.5, plt 283   . GERD (gastroesophageal reflux disease)   . Hammer toe of second toe of left foot 04/08/2016   Left 2nd  . Hearing loss   . Hemorrhoids   . Hypercholesterolemia   . Hypertension   .  Osteoarthritis 12/19/2006   Qualifier: Diagnosis of  By: Julien Girt CMA, Marliss Czar    . Osteoporosis    Past Surgical History:  Procedure Laterality Date  . BIOPSY  02/17/2020   Procedure: BIOPSY;  Surgeon: Jackquline Denmark, MD;  Location: [facility];  Service: Endoscopy;;  . CATARACT EXTRACTION, BILATERAL  2009  . COLONOSCOPY  2009, 2006 , 2017  . ESOPHAGOGASTRODUODENOSCOPY (EGD) WITH PROPOFOL N/A 02/17/2020   Procedure: ESOPHAGOGASTRODUODENOSCOPY (EGD) WITH PROPOFOL;  Surgeon: Jackquline Denmark, MD;  Location: [facility];  Service: Endoscopy;  Laterality: N/A;  . IR ANGIOGRAM SELECTIVE EACH ADDITIONAL  VESSEL  02/17/2020  . IR ANGIOGRAM SELECTIVE EACH ADDITIONAL VESSEL  02/17/2020  . IR ANGIOGRAM SELECTIVE EACH ADDITIONAL VESSEL  02/17/2020  . IR ANGIOGRAM SELECTIVE EACH ADDITIONAL VESSEL  02/17/2020  . IR ANGIOGRAM SELECTIVE EACH ADDITIONAL VESSEL  02/17/2020  . IR ANGIOGRAM VISCERAL SELECTIVE  02/17/2020  . IR ANGIOGRAM VISCERAL SELECTIVE  02/17/2020  . IR US GUIDE VASC ACCESS RIGHT  02/17/2020  . stapes surgery     Dr Thornell Mule  . TONSILLECTOMY AND ADENOIDECTOMY     as a child    Allergies  Allergen Reactions  . Penicillins Anaphylaxis  . Bisphosphonates Other (See Comments)    [name]  . Influenza Vaccines     Pt reported  . Simvastatin     Unknown   . Trazodone And Nefazodone     Felt her throat was closing up/kept her awake  . Sulfonamide Derivatives Rash and Other (See Comments)    blisters    Allergies as of 02/22/2020      Reactions   Penicillins Anaphylaxis   Bisphosphonates Other (See Comments)   [name]   Influenza Vaccines    Pt reported   Simvastatin    Unknown    Trazodone And Nefazodone    Felt her throat was closing up/kept her awake   Sulfonamide Derivatives Rash, Other (See Comments)   blisters      Medication List       Accurate as of February 22, 2020  2:43 PM. If you have any questions, ask your nurse or doctor.        budesonide-formoterol 80-4.5 MCG/ACT inhaler Commonly known as: SYMBICORT Inhale 1 puff into the lungs 2 (two) times daily.   calcium carbonate 750 MG chewable tablet Commonly known as: TUMS EX Chew 750 mg by mouth daily.   cholecalciferol 1000 units tablet Commonly known as: VITAMIN D Take 1,000 Units by mouth daily.   cyclobenzaprine 5 MG tablet Commonly known as: FLEXERIL Take 2.5 mg by mouth at bedtime.   estradiol 0.1 MG/GM vaginal cream Commonly known as: ESTRACE Place 1 Applicatorful vaginally every Monday, Wednesday, and Friday.   ferrous sulfate 325 (65 FE) MG tablet Take 325 mg by mouth. Once A Day on  Mon, Wed, Fri   fluconazole 200 MG tablet Commonly known as: Diflucan Take 100 mg by mouth once then take 200 mg daily for 14 days Started by: Jackquline Denmark, MD   hydrocortisone cream 1 % Apply 1 application topically 2 (two) times daily as needed (wrists).   metoprolol tartrate 25 MG tablet Commonly known as: LOPRESSOR Take 12.5 mg by mouth 2 (two) times daily.   nitrofurantoin 50 MG capsule Commonly known as: MACRODANTIN Take 50 mg by mouth at bedtime.   pantoprazole 40 MG tablet Commonly known as: PROTONIX Take 1 tablet (40 mg total) by mouth daily at 6 (six) AM.   polyethylene glycol 17 g  packet Commonly known as: MIRALAX / GLYCOLAX Take 17 g by mouth 2 (two) times a week. Sunday and Wednesday   pregabalin 50 MG capsule Commonly known as: LYRICA Take 1 capsule (50 mg total) by mouth daily.   Tubersol 5 UNIT/0.1ML injection Generic drug: tuberculin Inject 5 Units into the skin once.   vitamin B-12 1000 MCG tablet Commonly known as: CYANOCOBALAMIN Take 1,000 mcg by mouth daily.       Review of Systems  Constitutional: Positive for activity change and appetite change.  HENT: Negative.   Respiratory: Negative.   Cardiovascular: Negative.   Gastrointestinal: Positive for constipation and nausea. Negative for blood in stool.  Genitourinary: Negative.   Musculoskeletal: Negative.   Skin: Negative.   Neurological: Positive for weakness.  Psychiatric/Behavioral: Positive for dysphoric mood.    Immunization History  Administered Date(s) Administered  . Moderna Sars-Covid-2 Vaccination 02/17/2019, 03/17/2019  . Pneumococcal Conjugate-13 09/01/2015  . Pneumococcal Polysaccharide-23 07/07/1998  . Td 08/21/2009   Pertinent  Health Maintenance Due  Topic Date Due  . PNA vac Low Risk Adult (2 of 2 - PPSV23) 08/31/2016  . DEXA SCAN  Completed  . INFLUENZA VACCINE  Discontinued   Fall Risk  11/08/2017 11/05/2016 09/01/2015 08/31/2012  Falls in the past year? No Yes  Yes No  Number falls in past yr: - 2 or more 1 -  Injury with Fall? - Yes No -  Risk for fall due to : - - Impaired balance/gait -   Functional Status Survey:    Vitals:   02/22/20 1431  BP: 140/78  Pulse: 73  Resp: 20  Temp: 98.6 F (37 C)  SpO2: 93%  Weight: 134 lb (60.8 kg)  Height: 5\' 1"  (1.549 m)   Body mass index is 25.32 kg/m. Physical Exam Vitals reviewed.  Constitutional:      Appearance: Normal appearance.  HENT:     Head: Normocephalic.     Nose: Nose normal.     Mouth/Throat:     Mouth: Mucous membranes are moist.     Pharynx: Oropharynx is clear.     Comments: No Thrush Eyes:     Pupils: Pupils are equal, round, and reactive to light.  Cardiovascular:     Rate and Rhythm: Normal rate and regular rhythm.     Pulses: Normal pulses.  Pulmonary:     Effort: Pulmonary effort is normal.  Abdominal:     General: Abdomen is flat. Bowel sounds are normal.     Palpations: Abdomen is soft.  Musculoskeletal:        General: No swelling.     Cervical back: Neck supple.  Skin:    General: Skin is warm.  Neurological:     General: No focal deficit present.     Mental Status: She is alert and oriented to person, place, and time.  Psychiatric:        Mood and Affect: Mood normal.        Thought Content: Thought content normal.     Labs reviewed: Recent Labs    02/18/20 0115 02/19/20 0254 02/20/20 0909  NA 136 139 140  K 3.7 3.4* 3.4*  CL 108 108 108  CO2 21* 23 24  GLUCOSE 122* 125* 104*  BUN 16 19 15   CREATININE 0.58 0.65 0.71  CALCIUM 7.8* 8.5* 8.2*   Recent Labs    10/02/19 0000 12/25/19 0000 02/16/20 0408  AST 15 13 18   ALT 10 7 15   ALKPHOS 71 81 72  BILITOT  --   --  0.6  PROT  --   --  6.0*  ALBUMIN 3.7 3.6 3.8   Recent Labs    04/12/19 0000 10/02/19 0000 01/23/20 0000 02/18/20 0115 02/19/20 0254 02/20/20 0909  WBC 5.2 4.6   < > 9.8 7.5 6.1  NEUTROABS 2,647 2,157  --   --   --   --   HGB 10.9* 10.9*   < > 7.7* 6.8* 7.7*   HCT 33* 32*   < > 23.2* 20.7* 23.1*  MCV  --   --    < > 92.4 92.8 87.8  PLT 213 182   < > 165 156 158   < > = values in this interval not displayed.   Lab Results  Component Value Date   TSH 1.92 01/10/2019   Lab Results  Component Value Date   HGBA1C 5.8 02/17/2017   Lab Results  Component Value Date   CHOL 177 01/10/2019   HDL 52 01/10/2019   LDLCALC 104 01/10/2019   LDLDIRECT 114.7 08/18/2011   TRIG 110 01/10/2019   CHOLHDL 2 01/23/2015    Significant Diagnostic Results in last 30 days:  IR Angiogram Visceral Selective  Result Date: 02/18/2020 INDICATION: 87 year old female with acute sigmoid diverticular bleed. She presents for arteriogram and possible embolization. EXAM: SELECTIVE VISCERAL ARTERIOGRAPHY; IR ULTRASOUND GUIDANCE VASC ACCESS RIGHT; ADDITIONAL ARTERIOGRAPHY MEDICATIONS: None. ANESTHESIA/SEDATION: 1 mg Versed administered for anxiolysis. CONTRAST:  51mL OMNIPAQUE IOHEXOL 300 MG/ML SOLN, 135mL OMNIPAQUE IOHEXOL 300 MG/ML SOLN, 36mL OMNIPAQUE IOHEXOL 300 MG/ML SOLN, 32mL OMNIPAQUE IOHEXOL 300 MG/ML SOLN FLUOROSCOPY TIME:  Fluoroscopy Time: 25 minutes 12 seconds (1901 mGy). COMPLICATIONS: None immediate. PROCEDURE: Informed consent was obtained from the patient following explanation of the procedure, risks, benefits and alternatives. The patient understands, agrees and consents for the procedure. All questions were addressed. A time out was performed prior to the initiation of the procedure. Maximal barrier sterile technique utilized including caps, mask, sterile gowns, sterile gloves, large sterile drape, hand hygiene, and Betadine prep. The right common femoral artery was interrogated with ultrasound and found to be widely patent. An image was obtained and stored for the medical record. Local anesthesia was attained by infiltration with 1% lidocaine. A small dermatotomy was made. Under real-time sonographic guidance, the vessel was punctured with a 21 gauge micropuncture  needle. Using standard technique, the initial micro needle was exchanged over a 0.018 micro wire for a transitional 4 [place] micro sheath. The micro sheath was then exchanged over a 0.035 wire for a 5 French vascular sheath. Initially, a RIM catheter was introduced into the abdominal aorta over a Bentson wire. The aorta is tortuous and variant in caliber with portions that are ectatic, and portions that are small. Heavily calcified atherosclerotic plaque noted throughout. The origin of the inferior mesenteric artery could not be engaged with this catheter. Therefore, the catheter was exchanged for a Mickelson catheter. The Mickelson catheter was formed. Again, the origin of the IMA could not be successfully engaged. The Mickelson catheter was then exchanged for a Sos Omni selective catheter. Despite multiple attempts, the origin of the IMA could not be successfully identified through the heavily calcified plaque. At this point, the decision was made to pursue angiography of the superior mesenteric artery to see if there is a communication into the IMA territory via the middle colic artery. A C2 cobra catheter was advanced over the Bentson wire and used to select the superior mesenteric artery. An angiogram was performed.  No obvious communication with the IMA. No evidence of active bleeding. A renegade STC microcatheter was advanced over a Fathom 16 wire and used to select a proximal branch arising from the IMA. Arteriography was performed. This is a jejunal branch with normal opacification of the jejunal mucosa. No evidence of active bleeding. No communication with the IMA territory. The microcatheter was used to select another branch artery. Contrast injection demonstrates filling of the redundant transverse colon. This appears to be the left middle colic artery colic artery. No evidence of active bleeding. The microcatheter was advanced further into the unnamed branch supplying the distal transverse colon. Super  selective arteriography was performed. Again, no evidence of active bleeding. No communication with the main IMA territory. At this time, the decision was made to go back to attempting to catheterize the inferior mesenteric artery as this would provide the arterial supply to the region of active bleeding seen on the recent CT arteriogram. The Sos microcatheter was reintroduced. Additional arteriography was performed in multiple obliquities in till the origin of the IMA was successfully identified. Due to stenosis at the origin, the catheter tip could not be engaged in the artery. However, the catheter was positioned just outside the arterial ostium and ultimately, the renegade STC microcatheter and Fathom wire were successfully floated into the inferior mesenteric artery. Arteriography was then performed. No evidence of active bleeding on initial angiography. Due to the heavily stenotic origin, opacification of the arteries is somewhat limited. Therefore, the microcatheter was advanced more distally into a sigmoid branch of the IMA. Contrast injection was performed and demonstrates no evidence of active bleeding. The microcatheter was next advanced into the left colic artery. Contrast injection was performed. Again, no evidence of active bleeding. The patient's diverticular bleed appears to have spontaneously stopped. Catheters were removed. Hemostasis was attained with the aid of a Celt closure device. IMPRESSION: 1. Heavily diseased and calcified arteries. Catheterization of the inferior mesenteric artery was extremely challenging but ultimately successful. 2. No evidence of active bleeding at the time of arteriography. Signed, Criselda Peaches, MD, [facility] Rad[facility] Electronically Signed   By: Jacqulynn Cadet M.D.   On: 02/18/2020 08:29   IR Angiogram Visceral Selective  Result Date: 02/18/2020 INDICATION: 87 year old female with acute sigmoid  diverticular bleed. She presents for arteriogram and possible embolization. EXAM: SELECTIVE VISCERAL ARTERIOGRAPHY; IR ULTRASOUND GUIDANCE VASC ACCESS RIGHT; ADDITIONAL ARTERIOGRAPHY MEDICATIONS: None. ANESTHESIA/SEDATION: 1 mg Versed administered for anxiolysis. CONTRAST:  87mL OMNIPAQUE IOHEXOL 300 MG/ML SOLN, 169mL OMNIPAQUE IOHEXOL 300 MG/ML SOLN, 10mL OMNIPAQUE IOHEXOL 300 MG/ML SOLN, 64mL OMNIPAQUE IOHEXOL 300 MG/ML SOLN FLUOROSCOPY TIME:  Fluoroscopy Time: 25 minutes 12 seconds (1901 mGy). COMPLICATIONS: None immediate. PROCEDURE: Informed consent was obtained from the patient following explanation of the procedure, risks, benefits and alternatives. The patient understands, agrees and consents for the procedure. All questions were addressed. A time out was performed prior to the initiation of the procedure. Maximal barrier sterile technique utilized including caps, mask, sterile gowns, sterile gloves, large sterile drape, hand hygiene, and Betadine prep. The right common femoral artery was interrogated with ultrasound and found to be widely patent. An image was obtained and stored for the medical record. Local anesthesia was attained by infiltration with 1% lidocaine. A small dermatotomy was made. Under real-time sonographic guidance, the vessel was punctured with a 21 gauge micropuncture needle. Using standard technique, the initial micro needle was exchanged over a 0.018 micro wire for a transitional 4 [place] micro  sheath. The micro sheath was then exchanged over a 0.035 wire for a 5 French vascular sheath. Initially, a RIM catheter was introduced into the abdominal aorta over a Bentson wire. The aorta is tortuous and variant in caliber with portions that are ectatic, and portions that are small. Heavily calcified atherosclerotic plaque noted throughout. The origin of the inferior mesenteric artery could not be engaged with this catheter. Therefore, the catheter was exchanged for a Mickelson catheter. The  Mickelson catheter was formed. Again, the origin of the IMA could not be successfully engaged. The Mickelson catheter was then exchanged for a Sos Omni selective catheter. Despite multiple attempts, the origin of the IMA could not be successfully identified through the heavily calcified plaque. At this point, the decision was made to pursue angiography of the superior mesenteric artery to see if there is a communication into the IMA territory via the middle colic artery. A C2 cobra catheter was advanced over the Bentson wire and used to select the superior mesenteric artery. An angiogram was performed. No obvious communication with the IMA. No evidence of active bleeding. A renegade STC microcatheter was advanced over a Fathom 16 wire and used to select a proximal branch arising from the IMA. Arteriography was performed. This is a jejunal branch with normal opacification of the jejunal mucosa. No evidence of active bleeding. No communication with the IMA territory. The microcatheter was used to select another branch artery. Contrast injection demonstrates filling of the redundant transverse colon. This appears to be the left middle colic artery colic artery. No evidence of active bleeding. The microcatheter was advanced further into the unnamed branch supplying the distal transverse colon. Super selective arteriography was performed. Again, no evidence of active bleeding. No communication with the main IMA territory. At this time, the decision was made to go back to attempting to catheterize the inferior mesenteric artery as this would provide the arterial supply to the region of active bleeding seen on the recent CT arteriogram. The Sos microcatheter was reintroduced. Additional arteriography was performed in multiple obliquities in till the origin of the IMA was successfully identified. Due to stenosis at the origin, the catheter tip could not be engaged in the artery. However, the catheter was positioned just  outside the arterial ostium and ultimately, the renegade STC microcatheter and Fathom wire were successfully floated into the inferior mesenteric artery. Arteriography was then performed. No evidence of active bleeding on initial angiography. Due to the heavily stenotic origin, opacification of the arteries is somewhat limited. Therefore, the microcatheter was advanced more distally into a sigmoid branch of the IMA. Contrast injection was performed and demonstrates no evidence of active bleeding. The microcatheter was next advanced into the left colic artery. Contrast injection was performed. Again, no evidence of active bleeding. The patient's diverticular bleed appears to have spontaneously stopped. Catheters were removed. Hemostasis was attained with the aid of a Celt closure device. IMPRESSION: 1. Heavily diseased and calcified arteries. Catheterization of the inferior mesenteric artery was extremely challenging but ultimately successful. 2. No evidence of active bleeding at the time of arteriography. Signed, Criselda Peaches, MD, [place] [facility] Radiology Electronically Signed   By: Jacqulynn Cadet M.D.   On: 02/18/2020 08:29   IR Angiogram Selective Each Additional Vessel  Result Date: 02/18/2020 INDICATION: 87 year old female with acute sigmoid diverticular bleed. She presents for arteriogram and possible embolization. EXAM: SELECTIVE VISCERAL ARTERIOGRAPHY; IR ULTRASOUND GUIDANCE VASC ACCESS RIGHT; ADDITIONAL ARTERIOGRAPHY MEDICATIONS: None. ANESTHESIA/SEDATION: 1 mg  Versed administered for anxiolysis. CONTRAST:  74mL OMNIPAQUE IOHEXOL 300 MG/ML SOLN, 129mL OMNIPAQUE IOHEXOL 300 MG/ML SOLN, 53mL OMNIPAQUE IOHEXOL 300 MG/ML SOLN, 25mL OMNIPAQUE IOHEXOL 300 MG/ML SOLN FLUOROSCOPY TIME:  Fluoroscopy Time: 25 minutes 12 seconds (1901 mGy). COMPLICATIONS: None immediate. PROCEDURE: Informed consent was obtained from the patient following explanation of the  procedure, risks, benefits and alternatives. The patient understands, agrees and consents for the procedure. All questions were addressed. A time out was performed prior to the initiation of the procedure. Maximal barrier sterile technique utilized including caps, mask, sterile gowns, sterile gloves, large sterile drape, hand hygiene, and Betadine prep. The right common femoral artery was interrogated with ultrasound and found to be widely patent. An image was obtained and stored for the medical record. Local anesthesia was attained by infiltration with 1% lidocaine. A small dermatotomy was made. Under real-time sonographic guidance, the vessel was punctured with a 21 gauge micropuncture needle. Using standard technique, the initial micro needle was exchanged over a 0.018 micro wire for a transitional 4 [place] micro sheath. The micro sheath was then exchanged over a 0.035 wire for a 5 French vascular sheath. Initially, a RIM catheter was introduced into the abdominal aorta over a Bentson wire. The aorta is tortuous and variant in caliber with portions that are ectatic, and portions that are small. Heavily calcified atherosclerotic plaque noted throughout. The origin of the inferior mesenteric artery could not be engaged with this catheter. Therefore, the catheter was exchanged for a Mickelson catheter. The Mickelson catheter was formed. Again, the origin of the IMA could not be successfully engaged. The Mickelson catheter was then exchanged for a Sos Omni selective catheter. Despite multiple attempts, the origin of the IMA could not be successfully identified through the heavily calcified plaque. At this point, the decision was made to pursue angiography of the superior mesenteric artery to see if there is a communication into the IMA territory via the middle colic artery. A C2 cobra catheter was advanced over the Bentson wire and used to select the superior mesenteric artery. An angiogram was performed. No obvious  communication with the IMA. No evidence of active bleeding. A renegade STC microcatheter was advanced over a Fathom 16 wire and used to select a proximal branch arising from the IMA. Arteriography was performed. This is a jejunal branch with normal opacification of the jejunal mucosa. No evidence of active bleeding. No communication with the IMA territory. The microcatheter was used to select another branch artery. Contrast injection demonstrates filling of the redundant transverse colon. This appears to be the left middle colic artery colic artery. No evidence of active bleeding. The microcatheter was advanced further into the unnamed branch supplying the distal transverse colon. Super selective arteriography was performed. Again, no evidence of active bleeding. No communication with the main IMA territory. At this time, the decision was made to go back to attempting to catheterize the inferior mesenteric artery as this would provide the arterial supply to the region of active bleeding seen on the recent CT arteriogram. The Sos microcatheter was reintroduced. Additional arteriography was performed in multiple obliquities in till the origin of the IMA was successfully identified. Due to stenosis at the origin, the catheter tip could not be engaged in the artery. However, the catheter was positioned just outside the arterial ostium and ultimately, the renegade STC microcatheter and Fathom wire were successfully floated into the inferior mesenteric artery. Arteriography was then performed. No evidence of active bleeding on initial angiography. Due to the  heavily stenotic origin, opacification of the arteries is somewhat limited. Therefore, the microcatheter was advanced more distally into a sigmoid branch of the IMA. Contrast injection was performed and demonstrates no evidence of active bleeding. The microcatheter was next advanced into the left colic artery. Contrast injection was performed. Again, no evidence of  active bleeding. The patient's diverticular bleed appears to have spontaneously stopped. Catheters were removed. Hemostasis was attained with the aid of a Celt closure device. IMPRESSION: 1. Heavily diseased and calcified arteries. Catheterization of the inferior mesenteric artery was extremely challenging but ultimately successful. 2. No evidence of active bleeding at the time of arteriography. Signed, Criselda Peaches, MD, [facility] Radiology Electronically Signed   By: Jacqulynn Cadet M.D.   On: 02/18/2020 08:29   IR Angiogram Selective Each Additional Vessel  Result Date: 02/18/2020 INDICATION: 87 year old female with acute sigmoid diverticular bleed. She presents for arteriogram and possible embolization. EXAM: SELECTIVE VISCERAL ARTERIOGRAPHY; IR ULTRASOUND GUIDANCE VASC ACCESS RIGHT; ADDITIONAL ARTERIOGRAPHY MEDICATIONS: None. ANESTHESIA/SEDATION: 1 mg Versed administered for anxiolysis. CONTRAST:  20mL OMNIPAQUE IOHEXOL 300 MG/ML SOLN, 128mL OMNIPAQUE IOHEXOL 300 MG/ML SOLN, 4mL OMNIPAQUE IOHEXOL 300 MG/ML SOLN, 15mL OMNIPAQUE IOHEXOL 300 MG/ML SOLN FLUOROSCOPY TIME:  Fluoroscopy Time: 25 minutes 12 seconds (1901 mGy). COMPLICATIONS: None immediate. PROCEDURE: Informed consent was obtained from the patient following explanation of the procedure, risks, benefits and alternatives. The patient understands, agrees and consents for the procedure. All questions were addressed. A time out was performed prior to the initiation of the procedure. Maximal barrier sterile technique utilized including caps, mask, sterile gowns, sterile gloves, large sterile drape, hand hygiene, and Betadine prep. The right common femoral artery was interrogated with ultrasound and found to be widely patent. An image was obtained and stored for the medical record. Local anesthesia was attained by infiltration with 1% lidocaine. A small dermatotomy was made. Under real-time  sonographic guidance, the vessel was punctured with a 21 gauge micropuncture needle. Using standard technique, the initial micro needle was exchanged over a 0.018 micro wire for a transitional 4 [place] micro sheath. The micro sheath was then exchanged over a 0.035 wire for a 5 French vascular sheath. Initially, a RIM catheter was introduced into the abdominal aorta over a Bentson wire. The aorta is tortuous and variant in caliber with portions that are ectatic, and portions that are small. Heavily calcified atherosclerotic plaque noted throughout. The origin of the inferior mesenteric artery could not be engaged with this catheter. Therefore, the catheter was exchanged for a Mickelson catheter. The Mickelson catheter was formed. Again, the origin of the IMA could not be successfully engaged. The Mickelson catheter was then exchanged for a Sos Omni selective catheter. Despite multiple attempts, the origin of the IMA could not be successfully identified through the heavily calcified plaque. At this point, the decision was made to pursue angiography of the superior mesenteric artery to see if there is a communication into the IMA territory via the middle colic artery. A C2 cobra catheter was advanced over the Bentson wire and used to select the superior mesenteric artery. An angiogram was performed. No obvious communication with the IMA. No evidence of active bleeding. A renegade STC microcatheter was advanced over a Fathom 16 wire and used to select a proximal branch arising from the IMA. Arteriography was performed. This is a jejunal branch with normal opacification of the jejunal mucosa. No evidence of active bleeding. No communication with the IMA territory. The microcatheter was used  to select another branch artery. Contrast injection demonstrates filling of the redundant transverse colon. This appears to be the left middle colic artery colic artery. No evidence of active bleeding. The microcatheter was advanced  further into the unnamed branch supplying the distal transverse colon. Super selective arteriography was performed. Again, no evidence of active bleeding. No communication with the main IMA territory. At this time, the decision was made to go back to attempting to catheterize the inferior mesenteric artery as this would provide the arterial supply to the region of active bleeding seen on the recent CT arteriogram. The Sos microcatheter was reintroduced. Additional arteriography was performed in multiple obliquities in till the origin of the IMA was successfully identified. Due to stenosis at the origin, the catheter tip could not be engaged in the artery. However, the catheter was positioned just outside the arterial ostium and ultimately, the renegade STC microcatheter and Fathom wire were successfully floated into the inferior mesenteric artery. Arteriography was then performed. No evidence of active bleeding on initial angiography. Due to the heavily stenotic origin, opacification of the arteries is somewhat limited. Therefore, the microcatheter was advanced more distally into a sigmoid branch of the IMA. Contrast injection was performed and demonstrates no evidence of active bleeding. The microcatheter was next advanced into the left colic artery. Contrast injection was performed. Again, no evidence of active bleeding. The patient's diverticular bleed appears to have spontaneously stopped. Catheters were removed. Hemostasis was attained with the aid of a Celt closure device. IMPRESSION: 1. Heavily diseased and calcified arteries. Catheterization of the inferior mesenteric artery was extremely challenging but ultimately successful. 2. No evidence of active bleeding at the time of arteriography. Signed, Criselda Peaches, MD, [facility] Radiology Electronically Signed   By: Jacqulynn Cadet M.D.   On: 02/18/2020 08:29   IR Angiogram Selective Each  Additional Vessel  Result Date: 02/18/2020 INDICATION: 87 year old female with acute sigmoid diverticular bleed. She presents for arteriogram and possible embolization. EXAM: SELECTIVE VISCERAL ARTERIOGRAPHY; IR ULTRASOUND GUIDANCE VASC ACCESS RIGHT; ADDITIONAL ARTERIOGRAPHY MEDICATIONS: None. ANESTHESIA/SEDATION: 1 mg Versed administered for anxiolysis. CONTRAST:  8mL OMNIPAQUE IOHEXOL 300 MG/ML SOLN, 150mL OMNIPAQUE IOHEXOL 300 MG/ML SOLN, 54mL OMNIPAQUE IOHEXOL 300 MG/ML SOLN, 41mL OMNIPAQUE IOHEXOL 300 MG/ML SOLN FLUOROSCOPY TIME:  Fluoroscopy Time: 25 minutes 12 seconds (1901 mGy). COMPLICATIONS: None immediate. PROCEDURE: Informed consent was obtained from the patient following explanation of the procedure, risks, benefits and alternatives. The patient understands, agrees and consents for the procedure. All questions were addressed. A time out was performed prior to the initiation of the procedure. Maximal barrier sterile technique utilized including caps, mask, sterile gowns, sterile gloves, large sterile drape, hand hygiene, and Betadine prep. The right common femoral artery was interrogated with ultrasound and found to be widely patent. An image was obtained and stored for the medical record. Local anesthesia was attained by infiltration with 1% lidocaine. A small dermatotomy was made. Under real-time sonographic guidance, the vessel was punctured with a 21 gauge micropuncture needle. Using standard technique, the initial micro needle was exchanged over a 0.018 micro wire for a transitional 4 [place] micro sheath. The micro sheath was then exchanged over a 0.035 wire for a 5 French vascular sheath. Initially, a RIM catheter was introduced into the abdominal aorta over a Bentson wire. The aorta is tortuous and variant in caliber with portions that are ectatic, and portions that are small. Heavily calcified atherosclerotic plaque noted throughout. The origin of the inferior mesenteric  artery could not be  engaged with this catheter. Therefore, the catheter was exchanged for a Mickelson catheter. The Mickelson catheter was formed. Again, the origin of the IMA could not be successfully engaged. The Mickelson catheter was then exchanged for a Sos Omni selective catheter. Despite multiple attempts, the origin of the IMA could not be successfully identified through the heavily calcified plaque. At this point, the decision was made to pursue angiography of the superior mesenteric artery to see if there is a communication into the IMA territory via the middle colic artery. A C2 cobra catheter was advanced over the Bentson wire and used to select the superior mesenteric artery. An angiogram was performed. No obvious communication with the IMA. No evidence of active bleeding. A renegade STC microcatheter was advanced over a Fathom 16 wire and used to select a proximal branch arising from the IMA. Arteriography was performed. This is a jejunal branch with normal opacification of the jejunal mucosa. No evidence of active bleeding. No communication with the IMA territory. The microcatheter was used to select another branch artery. Contrast injection demonstrates filling of the redundant transverse colon. This appears to be the left middle colic artery colic artery. No evidence of active bleeding. The microcatheter was advanced further into the unnamed branch supplying the distal transverse colon. Super selective arteriography was performed. Again, no evidence of active bleeding. No communication with the main IMA territory. At this time, the decision was made to go back to attempting to catheterize the inferior mesenteric artery as this would provide the arterial supply to the region of active bleeding seen on the recent CT arteriogram. The Sos microcatheter was reintroduced. Additional arteriography was performed in multiple obliquities in till the origin of the IMA was successfully identified. Due to stenosis at the origin,  the catheter tip could not be engaged in the artery. However, the catheter was positioned just outside the arterial ostium and ultimately, the renegade STC microcatheter and Fathom wire were successfully floated into the inferior mesenteric artery. Arteriography was then performed. No evidence of active bleeding on initial angiography. Due to the heavily stenotic origin, opacification of the arteries is somewhat limited. Therefore, the microcatheter was advanced more distally into a sigmoid branch of the IMA. Contrast injection was performed and demonstrates no evidence of active bleeding. The microcatheter was next advanced into the left colic artery. Contrast injection was performed. Again, no evidence of active bleeding. The patient's diverticular bleed appears to have spontaneously stopped. Catheters were removed. Hemostasis was attained with the aid of a Celt closure device. IMPRESSION: 1. Heavily diseased and calcified arteries. Catheterization of the inferior mesenteric artery was extremely challenging but ultimately successful. 2. No evidence of active bleeding at the time of arteriography. Signed, Criselda Peaches, MD, [facility] Radiology Electronically Signed   By: Jacqulynn Cadet M.D.   On: 02/18/2020 08:29   IR Angiogram Selective Each Additional Vessel  Result Date: 02/18/2020 INDICATION: 87 year old female with acute sigmoid diverticular bleed. She presents for arteriogram and possible embolization. EXAM: SELECTIVE VISCERAL ARTERIOGRAPHY; IR ULTRASOUND GUIDANCE VASC ACCESS RIGHT; ADDITIONAL ARTERIOGRAPHY MEDICATIONS: None. ANESTHESIA/SEDATION: 1 mg Versed administered for anxiolysis. CONTRAST:  69mL OMNIPAQUE IOHEXOL 300 MG/ML SOLN, 124mL OMNIPAQUE IOHEXOL 300 MG/ML SOLN, 6mL OMNIPAQUE IOHEXOL 300 MG/ML SOLN, 60mL OMNIPAQUE IOHEXOL 300 MG/ML SOLN FLUOROSCOPY TIME:  Fluoroscopy Time: 25 minutes 12 seconds (1901 mGy). COMPLICATIONS: None  immediate. PROCEDURE: Informed consent was obtained from the patient following explanation of the procedure, risks, benefits and  alternatives. The patient understands, agrees and consents for the procedure. All questions were addressed. A time out was performed prior to the initiation of the procedure. Maximal barrier sterile technique utilized including caps, mask, sterile gowns, sterile gloves, large sterile drape, hand hygiene, and Betadine prep. The right common femoral artery was interrogated with ultrasound and found to be widely patent. An image was obtained and stored for the medical record. Local anesthesia was attained by infiltration with 1% lidocaine. A small dermatotomy was made. Under real-time sonographic guidance, the vessel was punctured with a 21 gauge micropuncture needle. Using standard technique, the initial micro needle was exchanged over a 0.018 micro wire for a transitional 4 [place] micro sheath. The micro sheath was then exchanged over a 0.035 wire for a 5 French vascular sheath. Initially, a RIM catheter was introduced into the abdominal aorta over a Bentson wire. The aorta is tortuous and variant in caliber with portions that are ectatic, and portions that are small. Heavily calcified atherosclerotic plaque noted throughout. The origin of the inferior mesenteric artery could not be engaged with this catheter. Therefore, the catheter was exchanged for a Mickelson catheter. The Mickelson catheter was formed. Again, the origin of the IMA could not be successfully engaged. The Mickelson catheter was then exchanged for a Sos Omni selective catheter. Despite multiple attempts, the origin of the IMA could not be successfully identified through the heavily calcified plaque. At this point, the decision was made to pursue angiography of the superior mesenteric artery to see if there is a communication into the IMA territory via the middle colic artery. A C2 cobra catheter was advanced over the  Bentson wire and used to select the superior mesenteric artery. An angiogram was performed. No obvious communication with the IMA. No evidence of active bleeding. A renegade STC microcatheter was advanced over a Fathom 16 wire and used to select a proximal branch arising from the IMA. Arteriography was performed. This is a jejunal branch with normal opacification of the jejunal mucosa. No evidence of active bleeding. No communication with the IMA territory. The microcatheter was used to select another branch artery. Contrast injection demonstrates filling of the redundant transverse colon. This appears to be the left middle colic artery colic artery. No evidence of active bleeding. The microcatheter was advanced further into the unnamed branch supplying the distal transverse colon. Super selective arteriography was performed. Again, no evidence of active bleeding. No communication with the main IMA territory. At this time, the decision was made to go back to attempting to catheterize the inferior mesenteric artery as this would provide the arterial supply to the region of active bleeding seen on the recent CT arteriogram. The Sos microcatheter was reintroduced. Additional arteriography was performed in multiple obliquities in till the origin of the IMA was successfully identified. Due to stenosis at the origin, the catheter tip could not be engaged in the artery. However, the catheter was positioned just outside the arterial ostium and ultimately, the renegade STC microcatheter and Fathom wire were successfully floated into the inferior mesenteric artery. Arteriography was then performed. No evidence of active bleeding on initial angiography. Due to the heavily stenotic origin, opacification of the arteries is somewhat limited. Therefore, the microcatheter was advanced more distally into a sigmoid branch of the IMA. Contrast injection was performed and demonstrates no evidence of active bleeding. The microcatheter  was next advanced into the left colic artery. Contrast injection was performed. Again, no evidence of active bleeding. The patient's diverticular bleed appears  to have spontaneously stopped. Catheters were removed. Hemostasis was attained with the aid of a Celt closure device. IMPRESSION: 1. Heavily diseased and calcified arteries. Catheterization of the inferior mesenteric artery was extremely challenging but ultimately successful. 2. No evidence of active bleeding at the time of arteriography. Signed, Criselda Peaches, MD, [facility] [facility] Radiology Electronically Signed   By: Jacqulynn Cadet M.D.   On: 02/18/2020 08:29   IR Angiogram Selective Each Additional Vessel  Result Date: 02/18/2020 INDICATION: 87 year old female with acute sigmoid diverticular bleed. She presents for arteriogram and possible embolization. EXAM: SELECTIVE VISCERAL ARTERIOGRAPHY; IR ULTRASOUND GUIDANCE VASC ACCESS RIGHT; ADDITIONAL ARTERIOGRAPHY MEDICATIONS: None. ANESTHESIA/SEDATION: 1 mg Versed administered for anxiolysis. CONTRAST:  30mL OMNIPAQUE IOHEXOL 300 MG/ML SOLN, 131mL OMNIPAQUE IOHEXOL 300 MG/ML SOLN, 30mL OMNIPAQUE IOHEXOL 300 MG/ML SOLN, 50mL OMNIPAQUE IOHEXOL 300 MG/ML SOLN FLUOROSCOPY TIME:  Fluoroscopy Time: 25 minutes 12 seconds (1901 mGy). COMPLICATIONS: None immediate. PROCEDURE: Informed consent was obtained from the patient following explanation of the procedure, risks, benefits and alternatives. The patient understands, agrees and consents for the procedure. All questions were addressed. A time out was performed prior to the initiation of the procedure. Maximal barrier sterile technique utilized including caps, mask, sterile gowns, sterile gloves, large sterile drape, hand hygiene, and Betadine prep. The right common femoral artery was interrogated with ultrasound and found to be widely patent. An image was obtained and stored for the medical record. Local  anesthesia was attained by infiltration with 1% lidocaine. A small dermatotomy was made. Under real-time sonographic guidance, the vessel was punctured with a 21 gauge micropuncture needle. Using standard technique, the initial micro needle was exchanged over a 0.018 micro wire for a transitional 4 [place] micro sheath. The micro sheath was then exchanged over a 0.035 wire for a 5 French vascular sheath. Initially, a RIM catheter was introduced into the abdominal aorta over a Bentson wire. The aorta is tortuous and variant in caliber with portions that are ectatic, and portions that are small. Heavily calcified atherosclerotic plaque noted throughout. The origin of the inferior mesenteric artery could not be engaged with this catheter. Therefore, the catheter was exchanged for a Mickelson catheter. The Mickelson catheter was formed. Again, the origin of the IMA could not be successfully engaged. The Mickelson catheter was then exchanged for a Sos Omni selective catheter. Despite multiple attempts, the origin of the IMA could not be successfully identified through the heavily calcified plaque. At this point, the decision was made to pursue angiography of the superior mesenteric artery to see if there is a communication into the IMA territory via the middle colic artery. A C2 cobra catheter was advanced over the Bentson wire and used to select the superior mesenteric artery. An angiogram was performed. No obvious communication with the IMA. No evidence of active bleeding. A renegade STC microcatheter was advanced over a Fathom 16 wire and used to select a proximal branch arising from the IMA. Arteriography was performed. This is a jejunal branch with normal opacification of the jejunal mucosa. No evidence of active bleeding. No communication with the IMA territory. The microcatheter was used to select another branch artery. Contrast injection demonstrates filling of the redundant transverse colon. This appears to be  the left middle colic artery colic artery. No evidence of active bleeding. The microcatheter was advanced further into the unnamed branch supplying the distal transverse colon. Super selective arteriography was performed. Again, no evidence of active bleeding. No communication with the main  IMA territory. At this time, the decision was made to go back to attempting to catheterize the inferior mesenteric artery as this would provide the arterial supply to the region of active bleeding seen on the recent CT arteriogram. The Sos microcatheter was reintroduced. Additional arteriography was performed in multiple obliquities in till the origin of the IMA was successfully identified. Due to stenosis at the origin, the catheter tip could not be engaged in the artery. However, the catheter was positioned just outside the arterial ostium and ultimately, the renegade STC microcatheter and Fathom wire were successfully floated into the inferior mesenteric artery. Arteriography was then performed. No evidence of active bleeding on initial angiography. Due to the heavily stenotic origin, opacification of the arteries is somewhat limited. Therefore, the microcatheter was advanced more distally into a sigmoid branch of the IMA. Contrast injection was performed and demonstrates no evidence of active bleeding. The microcatheter was next advanced into the left colic artery. Contrast injection was performed. Again, no evidence of active bleeding. The patient's diverticular bleed appears to have spontaneously stopped. Catheters were removed. Hemostasis was attained with the aid of a Celt closure device. IMPRESSION: 1. Heavily diseased and calcified arteries. Catheterization of the inferior mesenteric artery was extremely challenging but ultimately successful. 2. No evidence of active bleeding at the time of arteriography. Signed, Criselda Peaches, MD, [place] [facility] Radiology  Electronically Signed   By: Jacqulynn Cadet M.D.   On: 02/18/2020 08:29   IR US Guide Vasc Access Right  Result Date: 02/18/2020 INDICATION: 87 year old female with acute sigmoid diverticular bleed. She presents for arteriogram and possible embolization. EXAM: SELECTIVE VISCERAL ARTERIOGRAPHY; IR ULTRASOUND GUIDANCE VASC ACCESS RIGHT; ADDITIONAL ARTERIOGRAPHY MEDICATIONS: None. ANESTHESIA/SEDATION: 1 mg Versed administered for anxiolysis. CONTRAST:  83mL OMNIPAQUE IOHEXOL 300 MG/ML SOLN, 170mL OMNIPAQUE IOHEXOL 300 MG/ML SOLN, 66mL OMNIPAQUE IOHEXOL 300 MG/ML SOLN, 12mL OMNIPAQUE IOHEXOL 300 MG/ML SOLN FLUOROSCOPY TIME:  Fluoroscopy Time: 25 minutes 12 seconds (1901 mGy). COMPLICATIONS: None immediate. PROCEDURE: Informed consent was obtained from the patient following explanation of the procedure, risks, benefits and alternatives. The patient understands, agrees and consents for the procedure. All questions were addressed. A time out was performed prior to the initiation of the procedure. Maximal barrier sterile technique utilized including caps, mask, sterile gowns, sterile gloves, large sterile drape, hand hygiene, and Betadine prep. The right common femoral artery was interrogated with ultrasound and found to be widely patent. An image was obtained and stored for the medical record. Local anesthesia was attained by infiltration with 1% lidocaine. A small dermatotomy was made. Under real-time sonographic guidance, the vessel was punctured with a 21 gauge micropuncture needle. Using standard technique, the initial micro needle was exchanged over a 0.018 micro wire for a transitional 4 [place] micro sheath. The micro sheath was then exchanged over a 0.035 wire for a 5 French vascular sheath. Initially, a RIM catheter was introduced into the abdominal aorta over a Bentson wire. The aorta is tortuous and variant in caliber with portions that are ectatic, and portions that are small. Heavily calcified  atherosclerotic plaque noted throughout. The origin of the inferior mesenteric artery could not be engaged with this catheter. Therefore, the catheter was exchanged for a Mickelson catheter. The Mickelson catheter was formed. Again, the origin of the IMA could not be successfully engaged. The Mickelson catheter was then exchanged for a Sos Omni selective catheter. Despite multiple attempts, the origin of the IMA could not be successfully identified through the  heavily calcified plaque. At this point, the decision was made to pursue angiography of the superior mesenteric artery to see if there is a communication into the IMA territory via the middle colic artery. A C2 cobra catheter was advanced over the Bentson wire and used to select the superior mesenteric artery. An angiogram was performed. No obvious communication with the IMA. No evidence of active bleeding. A renegade STC microcatheter was advanced over a Fathom 16 wire and used to select a proximal branch arising from the IMA. Arteriography was performed. This is a jejunal branch with normal opacification of the jejunal mucosa. No evidence of active bleeding. No communication with the IMA territory. The microcatheter was used to select another branch artery. Contrast injection demonstrates filling of the redundant transverse colon. This appears to be the left middle colic artery colic artery. No evidence of active bleeding. The microcatheter was advanced further into the unnamed branch supplying the distal transverse colon. Super selective arteriography was performed. Again, no evidence of active bleeding. No communication with the main IMA territory. At this time, the decision was made to go back to attempting to catheterize the inferior mesenteric artery as this would provide the arterial supply to the region of active bleeding seen on the recent CT arteriogram. The Sos microcatheter was reintroduced. Additional arteriography was performed in multiple  obliquities in till the origin of the IMA was successfully identified. Due to stenosis at the origin, the catheter tip could not be engaged in the artery. However, the catheter was positioned just outside the arterial ostium and ultimately, the renegade STC microcatheter and Fathom wire were successfully floated into the inferior mesenteric artery. Arteriography was then performed. No evidence of active bleeding on initial angiography. Due to the heavily stenotic origin, opacification of the arteries is somewhat limited. Therefore, the microcatheter was advanced more distally into a sigmoid branch of the IMA. Contrast injection was performed and demonstrates no evidence of active bleeding. The microcatheter was next advanced into the left colic artery. Contrast injection was performed. Again, no evidence of active bleeding. The patient's diverticular bleed appears to have spontaneously stopped. Catheters were removed. Hemostasis was attained with the aid of a Celt closure device. IMPRESSION: 1. Heavily diseased and calcified arteries. Catheterization of the inferior mesenteric artery was extremely challenging but ultimately successful. 2. No evidence of active bleeding at the time of arteriography. Signed, Criselda Peaches, MD, [facility] Electronically Signed   By: Jacqulynn Cadet M.D.   On: 02/18/2020 08:29   CT Angio Abd/Pel w/ and/or w/o  Addendum Date: 02/17/2020   ADDENDUM REPORT: 02/17/2020 18:33 ADDENDUM: Critical Value/emergent results were called by telephone at the time of interpretation on 02/17/2020 at 6:32 pm to provider Dr. Louanne Belton, Who verbally acknowledged these results. Electronically Signed   By: Marijo Conception M.D.   On: 02/17/2020 18:33   Result Date: 02/17/2020 CLINICAL DATA:  Rectal bleeding.  Anemia. EXAM: CTA ABDOMEN AND PELVIS WITHOUT AND WITH CONTRAST TECHNIQUE: Multidetector CT imaging of the abdomen and pelvis was  performed using the standard protocol during bolus administration of intravenous contrast. Multiplanar reconstructed images and MIPs were obtained and reviewed to evaluate the vascular anatomy. CONTRAST:  120mL OMNIPAQUE IOHEXOL 350 MG/ML SOLN COMPARISON:  None. FINDINGS: VASCULAR Aorta: Atherosclerosis of thoracic aorta is noted without aneurysm or dissection. Celiac: Patent without evidence of aneurysm, dissection, vasculitis or significant stenosis. SMA: Patent without evidence of aneurysm, dissection, vasculitis or significant stenosis. Renals: 2 right  renal arteries are noted which are widely patent without significant stenosis. However, heavily calcified plaque is seen involving the proximal portion of the left renal artery resulting in moderate to severe focal stenosis. IMA: Patent without evidence of aneurysm, dissection, vasculitis or significant stenosis. Inflow: Common iliac arteries bilaterally are widely patent. However, multiple moderate stenoses are noted in the right external iliac artery secondary to calcified plaque. Multiple moderate stenoses are also noted in the left external iliac artery secondary to calcified plaque. Proximal Outflow: Moderate to severe narrowing is seen involving the visualized portions of the common and superficial femoral arteries bilaterally secondary to calcified plaque. Veins: No obvious venous abnormality within the limitations of this arterial phase study. Review of the MIP images confirms the above findings. NON-VASCULAR Lower chest: Small left pleural effusion is noted with adjacent subsegmental atelectasis. Hepatobiliary: No gallstones or biliary dilatation is noted. Left hepatic cyst is noted. Pancreas: Unremarkable. No pancreatic ductal dilatation or surrounding inflammatory changes. Spleen: Normal in size without focal abnormality. Adrenals/Urinary Tract: Adrenal glands are unremarkable. Kidneys are normal, without renal calculi, focal lesion, or hydronephrosis.  Bladder is unremarkable. Stomach/Bowel: Stomach is within normal limits. Appendix appears normal. There is no evidence of bowel obstruction. However, there is seen a small focus of extravasation of contrast in a portion of the proximal sigmoid colon best seen on image number 101 of series 7. This is consistent with active gastrointestinal hemorrhage. Lymphatic: No adenopathy is noted. Reproductive: Uterus and bilateral adnexa are unremarkable. Other: No abdominal wall hernia or abnormality. No abdominopelvic ascites. Musculoskeletal: No acute or significant osseous findings. IMPRESSION: Small focus of extravasation of contrast is seen in a portion of the proximal sigmoid colon consistent with active gastrointestinal hemorrhage. These results will be called to the ordering clinician or representative by the Radiologist Assistant, and communication documented in the PACS or zVision Dashboard. Severe stenosis is seen involving proximal portion of left renal artery secondary to calcified plaque. Multiple moderate to severe stenoses are noted in both external iliac arteries as well as the proximal femoral artery secondary to calcified plaque. Small left pleural effusion is noted with adjacent subsegmental atelectasis. Aortic Atherosclerosis (ICD10-I70.0). Electronically Signed: By: Marijo Conception M.D. On: 02/17/2020 18:24    Assessment/Plan Gastrointestinal hemorrhage associated with intestinal diverticulosis No More Bleeding Repeat CBC in 1 week  Acute gastritis without hemorrhage, unspecified gastritis type Continue on Protonix Avoid NSAIDS Candida esophagitis (HCC) Started on Diflucan per gi for 14 days Discontinue Symbicort for now Will restart at lower dose with Rinsing Mouth reinforced   COPD (chronic obstructive pulmonary disease) with chronic bronchitis (HCC) Hold Symbicort for 2 weeks And then restart at 1 puff QD  Primary osteoarthritis involving multiple joints Discontinue Alleve Only  on low dose of Flexeril  Essential hypertension On Lopressor  Anemia, unspecified type Started on Iron Repeat HGB Recurent UTI Restarted Nitrofurantoin Neuropathy Stable on Lyrica   Family/ staff Communication:   Labs/tests ordered:  CBC in 1 week  Total time spent in this patient care encounter was  45_  minutes; greater than 50% of the visit spent counseling patient and staff, reviewing records , Labs and coordinating care for problems addressed at this encounter.

## 2020-02-25 DIAGNOSIS — R531 Weakness: Secondary | ICD-10-CM | POA: Diagnosis not present

## 2020-02-25 DIAGNOSIS — R5383 Other fatigue: Secondary | ICD-10-CM | POA: Diagnosis not present

## 2020-02-25 DIAGNOSIS — K922 Gastrointestinal hemorrhage, unspecified: Secondary | ICD-10-CM | POA: Diagnosis not present

## 2020-02-25 DIAGNOSIS — D649 Anemia, unspecified: Secondary | ICD-10-CM | POA: Diagnosis not present

## 2020-02-25 DIAGNOSIS — R2681 Unsteadiness on feet: Secondary | ICD-10-CM | POA: Diagnosis not present

## 2020-02-25 DIAGNOSIS — M6281 Muscle weakness (generalized): Secondary | ICD-10-CM | POA: Diagnosis not present

## 2020-02-26 DIAGNOSIS — K922 Gastrointestinal hemorrhage, unspecified: Secondary | ICD-10-CM | POA: Diagnosis not present

## 2020-02-26 DIAGNOSIS — D649 Anemia, unspecified: Secondary | ICD-10-CM | POA: Diagnosis not present

## 2020-02-26 DIAGNOSIS — R2681 Unsteadiness on feet: Secondary | ICD-10-CM | POA: Diagnosis not present

## 2020-02-26 DIAGNOSIS — R5383 Other fatigue: Secondary | ICD-10-CM | POA: Diagnosis not present

## 2020-02-26 DIAGNOSIS — R531 Weakness: Secondary | ICD-10-CM | POA: Diagnosis not present

## 2020-02-26 DIAGNOSIS — M6281 Muscle weakness (generalized): Secondary | ICD-10-CM | POA: Diagnosis not present

## 2020-02-27 DIAGNOSIS — M6281 Muscle weakness (generalized): Secondary | ICD-10-CM | POA: Diagnosis not present

## 2020-02-27 DIAGNOSIS — R531 Weakness: Secondary | ICD-10-CM | POA: Diagnosis not present

## 2020-02-27 DIAGNOSIS — D649 Anemia, unspecified: Secondary | ICD-10-CM | POA: Diagnosis not present

## 2020-02-27 DIAGNOSIS — K922 Gastrointestinal hemorrhage, unspecified: Secondary | ICD-10-CM | POA: Diagnosis not present

## 2020-02-27 DIAGNOSIS — R2681 Unsteadiness on feet: Secondary | ICD-10-CM | POA: Diagnosis not present

## 2020-02-27 DIAGNOSIS — R5383 Other fatigue: Secondary | ICD-10-CM | POA: Diagnosis not present

## 2020-02-28 DIAGNOSIS — R531 Weakness: Secondary | ICD-10-CM | POA: Diagnosis not present

## 2020-02-28 DIAGNOSIS — I10 Essential (primary) hypertension: Secondary | ICD-10-CM | POA: Diagnosis not present

## 2020-02-28 DIAGNOSIS — D649 Anemia, unspecified: Secondary | ICD-10-CM | POA: Diagnosis not present

## 2020-02-28 DIAGNOSIS — M6281 Muscle weakness (generalized): Secondary | ICD-10-CM | POA: Diagnosis not present

## 2020-02-28 DIAGNOSIS — R5383 Other fatigue: Secondary | ICD-10-CM | POA: Diagnosis not present

## 2020-02-28 DIAGNOSIS — K922 Gastrointestinal hemorrhage, unspecified: Secondary | ICD-10-CM | POA: Diagnosis not present

## 2020-02-28 DIAGNOSIS — R2681 Unsteadiness on feet: Secondary | ICD-10-CM | POA: Diagnosis not present

## 2020-02-28 LAB — CBC: RBC: 2.84 — AB (ref 3.87–5.11)

## 2020-02-28 LAB — CBC AND DIFFERENTIAL
HCT: 26 — AB (ref 36–46)
Hemoglobin: 8.6 — AB (ref 12.0–16.0)
Neutrophils Absolute: 3322
Platelets: 312 (ref 150–399)
WBC: 5.9

## 2020-02-28 LAB — BASIC METABOLIC PANEL
BUN: 13 (ref 4–21)
CO2: 28 — AB (ref 13–22)
Chloride: 107 (ref 99–108)
Creatinine: 0.9 (ref 0.5–1.1)
Glucose: 85
Potassium: 4.3 (ref 3.4–5.3)
Sodium: 142 (ref 137–147)

## 2020-02-28 LAB — COMPREHENSIVE METABOLIC PANEL: Calcium: 9.1 (ref 8.7–10.7)

## 2020-02-29 DIAGNOSIS — R531 Weakness: Secondary | ICD-10-CM | POA: Diagnosis not present

## 2020-02-29 DIAGNOSIS — D649 Anemia, unspecified: Secondary | ICD-10-CM | POA: Diagnosis not present

## 2020-02-29 DIAGNOSIS — M6281 Muscle weakness (generalized): Secondary | ICD-10-CM | POA: Diagnosis not present

## 2020-02-29 DIAGNOSIS — K922 Gastrointestinal hemorrhage, unspecified: Secondary | ICD-10-CM | POA: Diagnosis not present

## 2020-02-29 DIAGNOSIS — R5383 Other fatigue: Secondary | ICD-10-CM | POA: Diagnosis not present

## 2020-02-29 DIAGNOSIS — R2681 Unsteadiness on feet: Secondary | ICD-10-CM | POA: Diagnosis not present

## 2020-03-04 DIAGNOSIS — R531 Weakness: Secondary | ICD-10-CM | POA: Diagnosis not present

## 2020-03-04 DIAGNOSIS — D649 Anemia, unspecified: Secondary | ICD-10-CM | POA: Diagnosis not present

## 2020-03-04 DIAGNOSIS — K922 Gastrointestinal hemorrhage, unspecified: Secondary | ICD-10-CM | POA: Diagnosis not present

## 2020-03-04 DIAGNOSIS — R2681 Unsteadiness on feet: Secondary | ICD-10-CM | POA: Diagnosis not present

## 2020-03-04 DIAGNOSIS — M6281 Muscle weakness (generalized): Secondary | ICD-10-CM | POA: Diagnosis not present

## 2020-03-04 DIAGNOSIS — R5383 Other fatigue: Secondary | ICD-10-CM | POA: Diagnosis not present

## 2020-03-05 DIAGNOSIS — M6281 Muscle weakness (generalized): Secondary | ICD-10-CM | POA: Diagnosis not present

## 2020-03-05 DIAGNOSIS — K922 Gastrointestinal hemorrhage, unspecified: Secondary | ICD-10-CM | POA: Diagnosis not present

## 2020-03-05 DIAGNOSIS — D649 Anemia, unspecified: Secondary | ICD-10-CM | POA: Diagnosis not present

## 2020-03-05 DIAGNOSIS — R2681 Unsteadiness on feet: Secondary | ICD-10-CM | POA: Diagnosis not present

## 2020-03-05 DIAGNOSIS — R531 Weakness: Secondary | ICD-10-CM | POA: Diagnosis not present

## 2020-03-05 DIAGNOSIS — R5383 Other fatigue: Secondary | ICD-10-CM | POA: Diagnosis not present

## 2020-03-06 DIAGNOSIS — D649 Anemia, unspecified: Secondary | ICD-10-CM | POA: Diagnosis not present

## 2020-03-06 DIAGNOSIS — R5383 Other fatigue: Secondary | ICD-10-CM | POA: Diagnosis not present

## 2020-03-06 DIAGNOSIS — K922 Gastrointestinal hemorrhage, unspecified: Secondary | ICD-10-CM | POA: Diagnosis not present

## 2020-03-06 DIAGNOSIS — R531 Weakness: Secondary | ICD-10-CM | POA: Diagnosis not present

## 2020-03-06 DIAGNOSIS — M6281 Muscle weakness (generalized): Secondary | ICD-10-CM | POA: Diagnosis not present

## 2020-03-06 DIAGNOSIS — R2681 Unsteadiness on feet: Secondary | ICD-10-CM | POA: Diagnosis not present

## 2020-03-07 ENCOUNTER — Ambulatory Visit: Payer: Medicare Other | Admitting: Gastroenterology

## 2020-03-07 DIAGNOSIS — R5383 Other fatigue: Secondary | ICD-10-CM | POA: Diagnosis not present

## 2020-03-07 DIAGNOSIS — K922 Gastrointestinal hemorrhage, unspecified: Secondary | ICD-10-CM | POA: Diagnosis not present

## 2020-03-07 DIAGNOSIS — D649 Anemia, unspecified: Secondary | ICD-10-CM | POA: Diagnosis not present

## 2020-03-07 DIAGNOSIS — M6281 Muscle weakness (generalized): Secondary | ICD-10-CM | POA: Diagnosis not present

## 2020-03-07 DIAGNOSIS — R531 Weakness: Secondary | ICD-10-CM | POA: Diagnosis not present

## 2020-03-07 DIAGNOSIS — R2681 Unsteadiness on feet: Secondary | ICD-10-CM | POA: Diagnosis not present

## 2020-03-10 DIAGNOSIS — M6281 Muscle weakness (generalized): Secondary | ICD-10-CM | POA: Diagnosis not present

## 2020-03-10 DIAGNOSIS — R2681 Unsteadiness on feet: Secondary | ICD-10-CM | POA: Diagnosis not present

## 2020-03-10 DIAGNOSIS — K922 Gastrointestinal hemorrhage, unspecified: Secondary | ICD-10-CM | POA: Diagnosis not present

## 2020-03-10 DIAGNOSIS — D649 Anemia, unspecified: Secondary | ICD-10-CM | POA: Diagnosis not present

## 2020-03-10 DIAGNOSIS — R531 Weakness: Secondary | ICD-10-CM | POA: Diagnosis not present

## 2020-03-10 DIAGNOSIS — R5383 Other fatigue: Secondary | ICD-10-CM | POA: Diagnosis not present

## 2020-03-11 DIAGNOSIS — R531 Weakness: Secondary | ICD-10-CM | POA: Diagnosis not present

## 2020-03-11 DIAGNOSIS — D649 Anemia, unspecified: Secondary | ICD-10-CM | POA: Diagnosis not present

## 2020-03-11 DIAGNOSIS — M6281 Muscle weakness (generalized): Secondary | ICD-10-CM | POA: Diagnosis not present

## 2020-03-11 DIAGNOSIS — R2681 Unsteadiness on feet: Secondary | ICD-10-CM | POA: Diagnosis not present

## 2020-03-11 DIAGNOSIS — K922 Gastrointestinal hemorrhage, unspecified: Secondary | ICD-10-CM | POA: Diagnosis not present

## 2020-03-11 DIAGNOSIS — R5383 Other fatigue: Secondary | ICD-10-CM | POA: Diagnosis not present

## 2020-03-12 DIAGNOSIS — R531 Weakness: Secondary | ICD-10-CM | POA: Diagnosis not present

## 2020-03-12 DIAGNOSIS — R2681 Unsteadiness on feet: Secondary | ICD-10-CM | POA: Diagnosis not present

## 2020-03-12 DIAGNOSIS — K922 Gastrointestinal hemorrhage, unspecified: Secondary | ICD-10-CM | POA: Diagnosis not present

## 2020-03-12 DIAGNOSIS — R5383 Other fatigue: Secondary | ICD-10-CM | POA: Diagnosis not present

## 2020-03-12 DIAGNOSIS — M6281 Muscle weakness (generalized): Secondary | ICD-10-CM | POA: Diagnosis not present

## 2020-03-12 DIAGNOSIS — D649 Anemia, unspecified: Secondary | ICD-10-CM | POA: Diagnosis not present

## 2020-03-13 DIAGNOSIS — I10 Essential (primary) hypertension: Secondary | ICD-10-CM | POA: Diagnosis not present

## 2020-03-13 DIAGNOSIS — R2681 Unsteadiness on feet: Secondary | ICD-10-CM | POA: Diagnosis not present

## 2020-03-13 DIAGNOSIS — R531 Weakness: Secondary | ICD-10-CM | POA: Diagnosis not present

## 2020-03-13 DIAGNOSIS — D649 Anemia, unspecified: Secondary | ICD-10-CM | POA: Diagnosis not present

## 2020-03-13 DIAGNOSIS — M6281 Muscle weakness (generalized): Secondary | ICD-10-CM | POA: Diagnosis not present

## 2020-03-13 DIAGNOSIS — K922 Gastrointestinal hemorrhage, unspecified: Secondary | ICD-10-CM | POA: Diagnosis not present

## 2020-03-13 DIAGNOSIS — R5383 Other fatigue: Secondary | ICD-10-CM | POA: Diagnosis not present

## 2020-03-13 LAB — CBC AND DIFFERENTIAL
HCT: 28 — AB (ref 36–46)
Hemoglobin: 9 — AB (ref 12.0–16.0)
Neutrophils Absolute: 2391
Platelets: 214 (ref 150–399)
WBC: 4.3

## 2020-03-13 LAB — CBC: RBC: 3.04 — AB (ref 3.87–5.11)

## 2020-03-14 DIAGNOSIS — R531 Weakness: Secondary | ICD-10-CM | POA: Diagnosis not present

## 2020-03-14 DIAGNOSIS — R5383 Other fatigue: Secondary | ICD-10-CM | POA: Diagnosis not present

## 2020-03-14 DIAGNOSIS — R2681 Unsteadiness on feet: Secondary | ICD-10-CM | POA: Diagnosis not present

## 2020-03-14 DIAGNOSIS — M6281 Muscle weakness (generalized): Secondary | ICD-10-CM | POA: Diagnosis not present

## 2020-03-14 DIAGNOSIS — D649 Anemia, unspecified: Secondary | ICD-10-CM | POA: Diagnosis not present

## 2020-03-14 DIAGNOSIS — K922 Gastrointestinal hemorrhage, unspecified: Secondary | ICD-10-CM | POA: Diagnosis not present

## 2020-03-17 DIAGNOSIS — M6281 Muscle weakness (generalized): Secondary | ICD-10-CM | POA: Diagnosis not present

## 2020-03-17 DIAGNOSIS — K922 Gastrointestinal hemorrhage, unspecified: Secondary | ICD-10-CM | POA: Diagnosis not present

## 2020-03-17 DIAGNOSIS — R531 Weakness: Secondary | ICD-10-CM | POA: Diagnosis not present

## 2020-03-17 DIAGNOSIS — D649 Anemia, unspecified: Secondary | ICD-10-CM | POA: Diagnosis not present

## 2020-03-17 DIAGNOSIS — R5383 Other fatigue: Secondary | ICD-10-CM | POA: Diagnosis not present

## 2020-03-17 DIAGNOSIS — R2681 Unsteadiness on feet: Secondary | ICD-10-CM | POA: Diagnosis not present

## 2020-03-18 ENCOUNTER — Encounter: Payer: Self-pay | Admitting: Nurse Practitioner

## 2020-03-18 ENCOUNTER — Ambulatory Visit (INDEPENDENT_AMBULATORY_CARE_PROVIDER_SITE_OTHER): Payer: Medicare Other | Admitting: Nurse Practitioner

## 2020-03-18 VITALS — BP 124/60 | HR 65 | Ht 61.0 in | Wt 132.0 lb

## 2020-03-18 DIAGNOSIS — K922 Gastrointestinal hemorrhage, unspecified: Secondary | ICD-10-CM

## 2020-03-18 DIAGNOSIS — K259 Gastric ulcer, unspecified as acute or chronic, without hemorrhage or perforation: Secondary | ICD-10-CM | POA: Diagnosis not present

## 2020-03-18 DIAGNOSIS — D649 Anemia, unspecified: Secondary | ICD-10-CM | POA: Diagnosis not present

## 2020-03-18 NOTE — Progress Notes (Signed)
03/18/2020 Lisa King [id number] 08-26-1931   Chief Complaint: Hospital follow up   History of Present Illness: Lisa King is an 87 year old female with a past medical history of anxiety, osteoarthritis, fibromyalgia, hypertension, hyperlipidemia, asthma, COPD, anemia, GERD and diverticulosis.   She presented to the hospital on 02/16/2020 after passing a large amount of maroon colored stool and bright red blood per the rectum on 12/31. She was taking 2 Aleve tablets daily for arthritis pain. Labs in the ED showed Hg level of 9.8 (baseline Hg 10.3 on 01/23/2020) -> Hg 7.6. Transfused 1 unit of PRBCs. An EGD was done 1/2 which identified 3 nonbleeding cratered gastric ulcers without stigmata of recent bleeding and candidiasis esophagitis treated Pantoprazole 40mg  QD and Diflucan 200mg  po QD x 4 days.  She remained on PPI and she received Diflucan. Biopsies showed chronic gastritis with intestinal metaplasia consistent with atrophic gastritis. She did not wish to purse a colonoscopy. An abdominal/pelvic CTA 1/2 showed a small focus of extravasation of contrast in the proximal sigmoid colon consistent with active GI hemorrhage, severe stenosis to the proximal left renal artery and moderate to severe stenoses bilateral external iliac arteries. IR was consulted and a multivessel mesenteric angiogram did not identify evidence of active bleeding therefore embolization was not done. It was though she most likely had a diverticular bleed. Her Hg dropped to 6.8 on 1/4 and she received a 2nd unit of PRBCs. Her bloody BMs abated and she remained hemodynamically stable. She was discharged home to SNF on 02/21/2020 with instructions to continue Pantoprazole 40mg  QD and to  follow up with her PCP to manage her anemia.   She presents today for further GI follow up. She is accompanied by her daughter. She reports feeling quite well. Her appetite is good. No N/V or abdominal pain. She is passing a normal formed dark  brown to black BM every other day. Stools have been black in color since she started po iron. She is taking Miralax two days weekly. No further bloody stools or obvious blood from the rectum. No loose melenic stools. She is no longer taking Aleve or any other NSAID which has resulted in increased arthritis discomfort. She remains on Pantoprazole 40mg  daily and Ferrous Sulfate 325mg  three days weekly. Repeat laboratory studies 03/13/2020 showed Hg 9.0. HCT 28.0. 03/18/2020: Hg 9.8. HCT 29.7. PLT 208.    Abd/pelvic CTA 02/17/2020: Small focus of extravasation of contrast is seen in a portion of the proximal sigmoid colon consistent with active gastrointestinal hemorrhage. These results will be called to the ordering clinician or representative by the Radiologist Assistant, and communication documented in the PACS or zVision Dashboard. Severe stenosis is seen involving proximal portion of left renal artery secondary to calcified plaque. Multiple moderate to severe stenoses are noted in both external iliac arteries as well as the proximal femoral artery secondary to calcified plaque. Small left pleural effusion is noted with adjacent subsegmental atelectasis. Aortic Atherosclerosis   EGD 02/17/2020: Few patchy, white plaques were found in the upper third of the esophagus and in the middle third of the esophagus. Brushings were obtained. Z-line was well-defined at 38 cm. Examined by NBI Findings: Three non-bleeding cratered gastric ulcers 6-8 mm with no stigmata of bleeding were found in the gastric antrum. Biopsies were taken with a cold forceps for histology. Estimated blood loss: none. The examined duodenum was normal. No blood was noted in the upper GI tract. - Non-bleeding gastric ulcers with no  stigmata of bleeding. Biopsied. - ?Mild Candida esophagitis (brushed) A. GASTRIC BIOPSY:  - Chronic gastritis with intestinal metaplasia consistent with chronic  atrophic gastritis.  -  Warthin-Starry negative for Helicobacter pylori.  - No dysplasia or carcinoma.   Colonoscopy 10/18/2013: 1. Moderate diverticulosis was noted in the sigmoid colon 2. Severe melanosis was found throughout the entire examined colon 3. The colon was redundant 4. The colon mucosa was otherwise normal - adequate pep  Current Outpatient Medications on File Prior to Visit  Medication Sig Dispense Refill  . budesonide-formoterol (SYMBICORT) 80-4.5 MCG/ACT inhaler Inhale 1 puff into the lungs 2 (two) times daily.    . calcium carbonate (TUMS EX) 750 MG chewable tablet Chew 750 mg by mouth daily.     . cholecalciferol (VITAMIN D) 1000 UNITS tablet Take 1,000 Units by mouth daily.    . cyclobenzaprine (FLEXERIL) 5 MG tablet Take 2.5 mg by mouth at bedtime.    Marland Kitchen estradiol (ESTRACE) 0.1 MG/GM vaginal cream Place 1 Applicatorful vaginally every Monday, Wednesday, and Friday.    . ferrous sulfate 325 (65 FE) MG tablet Take 325 mg by mouth. Once A Day on Mon, Wed, Fri    . fluconazole (DIFLUCAN) 200 MG tablet Take 100 mg by mouth once then take 200 mg daily for 14 days 15 tablet 0  . hydrocortisone cream 1 % Apply 1 application topically 2 (two) times daily as needed (wrists).    . metoprolol tartrate (LOPRESSOR) 25 MG tablet Take 12.5 mg by mouth 2 (two) times daily.    . nitrofurantoin (MACRODANTIN) 50 MG capsule Take 50 mg by mouth at bedtime.    . pantoprazole (PROTONIX) 40 MG tablet Take 1 tablet (40 mg total) by mouth daily at 6 (six) AM.    . polyethylene glycol (MIRALAX / GLYCOLAX) 17 g packet Take 17 g by mouth 2 (two) times a week. Sunday and Wednesday    . pregabalin (LYRICA) 50 MG capsule Take 1 capsule (50 mg total) by mouth daily. 10 capsule 0  . tuberculin (TUBERSOL) 5 UNIT/0.1ML injection Inject 5 Units into the skin once.    . vitamin B-12 (CYANOCOBALAMIN) 1000 MCG tablet Take 1,000 mcg by mouth daily.     No current facility-administered medications on file prior to visit.   Allergies   Allergen Reactions  . Penicillins Anaphylaxis  . Bisphosphonates Other (See Comments)    [name]  . Influenza Vaccines     Pt reported  . Simvastatin     Unknown   . Trazodone And Nefazodone     Felt her throat was closing up/kept her awake  . Sulfonamide Derivatives Rash and Other (See Comments)    blisters   Current Medications, Allergies, Past Medical History, Past Surgical History, Family History and Social History were reviewed in [employer] record.  Review of Systems:   Constitutional: Negative for fever, sweats, chills or weight loss.  Respiratory: Negative for shortness of breath.   Cardiovascular: Negative for chest pain, palpitations and leg swelling.  Gastrointestinal: See HPI.  Musculoskeletal: Negative for back pain or muscle aches.  Neurological: Negative for dizziness, headaches or paresthesias.    Physical Exam: BP 124/60   Pulse 65   Ht 5\' 1"  (1.549 m)   Wt 132 lb (59.9 kg)   BMI 24.94 kg/m    Wt Readings from Last 3 Encounters:  03/18/20 132 lb (59.9 kg)  02/22/20 134 lb (60.8 kg)  02/21/20 134 lb (60.8 kg)   General:  87 year old female in no acute distress. Head: Normocephalic and atraumatic. Eyes: No scleral icterus. Conjunctiva pink . Ears: Normal auditory acuity. Mouth: Dentition intact. Few white patches on tongue.   Lungs: Clear throughout to auscultation. Heart: Regular rate and rhythm. Soft murmur.  Abdomen: Soft, nontender and nondistended. No masses or hepatomegaly. Normal bowel sounds x 4 quadrants.  Rectal: Deferred.  Musculoskeletal: Symmetrical with no gross deformities. Extremities: No edema. Neurological: Alert oriented x 4. No focal deficits.  Psychological: Alert and cooperative. Normal mood and affect  Assessment and Recommendations:  51. 87 year old female with which chronic anemia admitted to the hospital with GI bleed/marroon colored BMs and BRBRP in setting of chronic NSAID use.  Hg level of  9.8 (baseline Hg 10.3 on 01/23/2020) -> Hg 7.6. Transfused 1 unit of PRBCs.  S/P EGD 1/2 identified 3 nonbleeding cratered gastric ulcers without stigmata of recent bleeding and candidiasis esophagitis.  Biopsies were negative for H. Pylori, positive for atrophic gastritis. She did not wish to purse a colonoscopy. An abdominal/pelvic CTA 1/2 showed a small focus of extravasation of contrast in the proximal sigmoid colon consistent with active GI hemorrhage, severe stenosis to the proximal left renal artery and moderate to severe stenoses bilateral external iliac arteries. IR was consulted and a mesenteric angiogram without evidence of active bleeding therefore embolization was not done. Most likely diverticular bleed. Hg dropped to 6.8 on 1/4 and she received a 2nd unit of PRBCs. No further hematochezia and she remained hemodynamically stable. Discharged to SNF on 1/6. No further maroon colored stools or obvious BRBPR. Today Hg 9.8. -No NSAIDs -Continue Pantoprazole 40mg  QD for at total of 2 months to allow ulcer healing, long term PPI not recommended in setting of atrophic gastritis  -Continue Ferrous Sulfate 325mg   -Continue anemia follow up with PCP -Repeat EGD not required as verified by Dr. Leone Payor  -Patient to contact our office if she has bloody BMs -Continue Miralax, may take daily if needed -Follow up PRN

## 2020-03-18 NOTE — Patient Instructions (Addendum)
If you are age 85 or older, your body mass index should be between 23-30. Your Body mass index is 24.94 kg/m. If this is out of the aforementioned range listed, please consider follow up with your Primary Care Provider.  Please follow up with your primary care provider for follow up and management regarding your Complete Blood Count labs.  Please stay on your Iron indefinitely.  Stop taking the Pantoprazole as of April 15, 2020.  Please Avoid all NSAID's. Some examples of NSAID's are as follows: Aspirin (Bufferin, Bayer, and Excedrin) Ibuprofen (Advil, Motrin, Nuprin) Ketoprofen (Actron, Orudis) Naproxen (Aleve) Daypro  Indocin  Lodine  Naprosyn  Relafen  Vimovo Voltaren   Please call our office if you start having rectal bleeding again.  Please follow up with [place] as needed.  It was great seeing you today!  Thank you for entrusting me with your care and choosing [facility].  Noralyn Pick, CRNP

## 2020-03-19 DIAGNOSIS — R531 Weakness: Secondary | ICD-10-CM | POA: Diagnosis not present

## 2020-03-19 DIAGNOSIS — R5383 Other fatigue: Secondary | ICD-10-CM | POA: Diagnosis not present

## 2020-03-19 DIAGNOSIS — R2681 Unsteadiness on feet: Secondary | ICD-10-CM | POA: Diagnosis not present

## 2020-03-19 DIAGNOSIS — M6281 Muscle weakness (generalized): Secondary | ICD-10-CM | POA: Diagnosis not present

## 2020-03-19 DIAGNOSIS — D649 Anemia, unspecified: Secondary | ICD-10-CM | POA: Diagnosis not present

## 2020-03-19 DIAGNOSIS — K922 Gastrointestinal hemorrhage, unspecified: Secondary | ICD-10-CM | POA: Diagnosis not present

## 2020-03-20 DIAGNOSIS — R2681 Unsteadiness on feet: Secondary | ICD-10-CM | POA: Diagnosis not present

## 2020-03-20 DIAGNOSIS — M6281 Muscle weakness (generalized): Secondary | ICD-10-CM | POA: Diagnosis not present

## 2020-03-20 DIAGNOSIS — K922 Gastrointestinal hemorrhage, unspecified: Secondary | ICD-10-CM | POA: Diagnosis not present

## 2020-03-20 DIAGNOSIS — R5383 Other fatigue: Secondary | ICD-10-CM | POA: Diagnosis not present

## 2020-03-20 DIAGNOSIS — D649 Anemia, unspecified: Secondary | ICD-10-CM | POA: Diagnosis not present

## 2020-03-20 DIAGNOSIS — R531 Weakness: Secondary | ICD-10-CM | POA: Diagnosis not present

## 2020-03-21 DIAGNOSIS — R531 Weakness: Secondary | ICD-10-CM | POA: Diagnosis not present

## 2020-03-21 DIAGNOSIS — D649 Anemia, unspecified: Secondary | ICD-10-CM | POA: Diagnosis not present

## 2020-03-21 DIAGNOSIS — M6281 Muscle weakness (generalized): Secondary | ICD-10-CM | POA: Diagnosis not present

## 2020-03-21 DIAGNOSIS — K922 Gastrointestinal hemorrhage, unspecified: Secondary | ICD-10-CM | POA: Diagnosis not present

## 2020-03-21 DIAGNOSIS — R2681 Unsteadiness on feet: Secondary | ICD-10-CM | POA: Diagnosis not present

## 2020-03-21 DIAGNOSIS — R5383 Other fatigue: Secondary | ICD-10-CM | POA: Diagnosis not present

## 2020-03-24 DIAGNOSIS — R5383 Other fatigue: Secondary | ICD-10-CM | POA: Diagnosis not present

## 2020-03-24 DIAGNOSIS — M6281 Muscle weakness (generalized): Secondary | ICD-10-CM | POA: Diagnosis not present

## 2020-03-24 DIAGNOSIS — R2681 Unsteadiness on feet: Secondary | ICD-10-CM | POA: Diagnosis not present

## 2020-03-24 DIAGNOSIS — R531 Weakness: Secondary | ICD-10-CM | POA: Diagnosis not present

## 2020-03-24 DIAGNOSIS — K922 Gastrointestinal hemorrhage, unspecified: Secondary | ICD-10-CM | POA: Diagnosis not present

## 2020-03-24 DIAGNOSIS — D649 Anemia, unspecified: Secondary | ICD-10-CM | POA: Diagnosis not present

## 2020-03-25 DIAGNOSIS — M6281 Muscle weakness (generalized): Secondary | ICD-10-CM | POA: Diagnosis not present

## 2020-03-25 DIAGNOSIS — R2681 Unsteadiness on feet: Secondary | ICD-10-CM | POA: Diagnosis not present

## 2020-03-25 DIAGNOSIS — D649 Anemia, unspecified: Secondary | ICD-10-CM | POA: Diagnosis not present

## 2020-03-25 DIAGNOSIS — R531 Weakness: Secondary | ICD-10-CM | POA: Diagnosis not present

## 2020-03-25 DIAGNOSIS — K922 Gastrointestinal hemorrhage, unspecified: Secondary | ICD-10-CM | POA: Diagnosis not present

## 2020-03-25 DIAGNOSIS — R5383 Other fatigue: Secondary | ICD-10-CM | POA: Diagnosis not present

## 2020-03-26 DIAGNOSIS — R2681 Unsteadiness on feet: Secondary | ICD-10-CM | POA: Diagnosis not present

## 2020-03-26 DIAGNOSIS — R5383 Other fatigue: Secondary | ICD-10-CM | POA: Diagnosis not present

## 2020-03-26 DIAGNOSIS — R531 Weakness: Secondary | ICD-10-CM | POA: Diagnosis not present

## 2020-03-26 DIAGNOSIS — K922 Gastrointestinal hemorrhage, unspecified: Secondary | ICD-10-CM | POA: Diagnosis not present

## 2020-03-26 DIAGNOSIS — M6281 Muscle weakness (generalized): Secondary | ICD-10-CM | POA: Diagnosis not present

## 2020-03-26 DIAGNOSIS — D649 Anemia, unspecified: Secondary | ICD-10-CM | POA: Diagnosis not present

## 2020-03-27 DIAGNOSIS — R2681 Unsteadiness on feet: Secondary | ICD-10-CM | POA: Diagnosis not present

## 2020-03-27 DIAGNOSIS — K922 Gastrointestinal hemorrhage, unspecified: Secondary | ICD-10-CM | POA: Diagnosis not present

## 2020-03-27 DIAGNOSIS — D649 Anemia, unspecified: Secondary | ICD-10-CM | POA: Diagnosis not present

## 2020-03-27 DIAGNOSIS — M6281 Muscle weakness (generalized): Secondary | ICD-10-CM | POA: Diagnosis not present

## 2020-03-27 DIAGNOSIS — R531 Weakness: Secondary | ICD-10-CM | POA: Diagnosis not present

## 2020-03-27 DIAGNOSIS — R5383 Other fatigue: Secondary | ICD-10-CM | POA: Diagnosis not present

## 2020-04-01 DIAGNOSIS — D649 Anemia, unspecified: Secondary | ICD-10-CM | POA: Diagnosis not present

## 2020-04-01 DIAGNOSIS — R531 Weakness: Secondary | ICD-10-CM | POA: Diagnosis not present

## 2020-04-01 DIAGNOSIS — K922 Gastrointestinal hemorrhage, unspecified: Secondary | ICD-10-CM | POA: Diagnosis not present

## 2020-04-01 DIAGNOSIS — M6281 Muscle weakness (generalized): Secondary | ICD-10-CM | POA: Diagnosis not present

## 2020-04-01 DIAGNOSIS — R2681 Unsteadiness on feet: Secondary | ICD-10-CM | POA: Diagnosis not present

## 2020-04-01 DIAGNOSIS — R5383 Other fatigue: Secondary | ICD-10-CM | POA: Diagnosis not present

## 2020-04-02 DIAGNOSIS — D649 Anemia, unspecified: Secondary | ICD-10-CM | POA: Diagnosis not present

## 2020-04-02 DIAGNOSIS — M6281 Muscle weakness (generalized): Secondary | ICD-10-CM | POA: Diagnosis not present

## 2020-04-02 DIAGNOSIS — R2681 Unsteadiness on feet: Secondary | ICD-10-CM | POA: Diagnosis not present

## 2020-04-02 DIAGNOSIS — K922 Gastrointestinal hemorrhage, unspecified: Secondary | ICD-10-CM | POA: Diagnosis not present

## 2020-04-02 DIAGNOSIS — R5383 Other fatigue: Secondary | ICD-10-CM | POA: Diagnosis not present

## 2020-04-02 DIAGNOSIS — R531 Weakness: Secondary | ICD-10-CM | POA: Diagnosis not present

## 2020-04-03 DIAGNOSIS — R5383 Other fatigue: Secondary | ICD-10-CM | POA: Diagnosis not present

## 2020-04-03 DIAGNOSIS — R531 Weakness: Secondary | ICD-10-CM | POA: Diagnosis not present

## 2020-04-03 DIAGNOSIS — M6281 Muscle weakness (generalized): Secondary | ICD-10-CM | POA: Diagnosis not present

## 2020-04-03 DIAGNOSIS — K922 Gastrointestinal hemorrhage, unspecified: Secondary | ICD-10-CM | POA: Diagnosis not present

## 2020-04-03 DIAGNOSIS — R2681 Unsteadiness on feet: Secondary | ICD-10-CM | POA: Diagnosis not present

## 2020-04-03 DIAGNOSIS — D649 Anemia, unspecified: Secondary | ICD-10-CM | POA: Diagnosis not present

## 2020-04-04 DIAGNOSIS — M6281 Muscle weakness (generalized): Secondary | ICD-10-CM | POA: Diagnosis not present

## 2020-04-04 DIAGNOSIS — R2681 Unsteadiness on feet: Secondary | ICD-10-CM | POA: Diagnosis not present

## 2020-04-04 DIAGNOSIS — D649 Anemia, unspecified: Secondary | ICD-10-CM | POA: Diagnosis not present

## 2020-04-04 DIAGNOSIS — R5383 Other fatigue: Secondary | ICD-10-CM | POA: Diagnosis not present

## 2020-04-04 DIAGNOSIS — K922 Gastrointestinal hemorrhage, unspecified: Secondary | ICD-10-CM | POA: Diagnosis not present

## 2020-04-04 DIAGNOSIS — R531 Weakness: Secondary | ICD-10-CM | POA: Diagnosis not present

## 2020-04-07 DIAGNOSIS — R2681 Unsteadiness on feet: Secondary | ICD-10-CM | POA: Diagnosis not present

## 2020-04-07 DIAGNOSIS — M6281 Muscle weakness (generalized): Secondary | ICD-10-CM | POA: Diagnosis not present

## 2020-04-07 DIAGNOSIS — D649 Anemia, unspecified: Secondary | ICD-10-CM | POA: Diagnosis not present

## 2020-04-07 DIAGNOSIS — R531 Weakness: Secondary | ICD-10-CM | POA: Diagnosis not present

## 2020-04-07 DIAGNOSIS — R5383 Other fatigue: Secondary | ICD-10-CM | POA: Diagnosis not present

## 2020-04-07 DIAGNOSIS — K922 Gastrointestinal hemorrhage, unspecified: Secondary | ICD-10-CM | POA: Diagnosis not present

## 2020-04-08 DIAGNOSIS — R5383 Other fatigue: Secondary | ICD-10-CM | POA: Diagnosis not present

## 2020-04-08 DIAGNOSIS — K922 Gastrointestinal hemorrhage, unspecified: Secondary | ICD-10-CM | POA: Diagnosis not present

## 2020-04-08 DIAGNOSIS — R2681 Unsteadiness on feet: Secondary | ICD-10-CM | POA: Diagnosis not present

## 2020-04-08 DIAGNOSIS — D649 Anemia, unspecified: Secondary | ICD-10-CM | POA: Diagnosis not present

## 2020-04-08 DIAGNOSIS — R531 Weakness: Secondary | ICD-10-CM | POA: Diagnosis not present

## 2020-04-08 DIAGNOSIS — M6281 Muscle weakness (generalized): Secondary | ICD-10-CM | POA: Diagnosis not present

## 2020-04-09 ENCOUNTER — Non-Acute Institutional Stay: Payer: Medicare Other | Admitting: Nurse Practitioner

## 2020-04-09 ENCOUNTER — Encounter: Payer: Self-pay | Admitting: Nurse Practitioner

## 2020-04-09 DIAGNOSIS — K5901 Slow transit constipation: Secondary | ICD-10-CM

## 2020-04-09 DIAGNOSIS — R300 Dysuria: Secondary | ICD-10-CM | POA: Diagnosis not present

## 2020-04-09 DIAGNOSIS — M6281 Muscle weakness (generalized): Secondary | ICD-10-CM | POA: Diagnosis not present

## 2020-04-09 DIAGNOSIS — D62 Acute posthemorrhagic anemia: Secondary | ICD-10-CM | POA: Diagnosis not present

## 2020-04-09 DIAGNOSIS — I10 Essential (primary) hypertension: Secondary | ICD-10-CM | POA: Diagnosis not present

## 2020-04-09 DIAGNOSIS — R531 Weakness: Secondary | ICD-10-CM | POA: Diagnosis not present

## 2020-04-09 DIAGNOSIS — R442 Other hallucinations: Secondary | ICD-10-CM

## 2020-04-09 DIAGNOSIS — K922 Gastrointestinal hemorrhage, unspecified: Secondary | ICD-10-CM | POA: Diagnosis not present

## 2020-04-09 DIAGNOSIS — N952 Postmenopausal atrophic vaginitis: Secondary | ICD-10-CM | POA: Diagnosis not present

## 2020-04-09 DIAGNOSIS — M159 Polyosteoarthritis, unspecified: Secondary | ICD-10-CM | POA: Diagnosis not present

## 2020-04-09 DIAGNOSIS — J449 Chronic obstructive pulmonary disease, unspecified: Secondary | ICD-10-CM | POA: Diagnosis not present

## 2020-04-09 DIAGNOSIS — D649 Anemia, unspecified: Secondary | ICD-10-CM | POA: Diagnosis not present

## 2020-04-09 DIAGNOSIS — R5383 Other fatigue: Secondary | ICD-10-CM | POA: Diagnosis not present

## 2020-04-09 DIAGNOSIS — K219 Gastro-esophageal reflux disease without esophagitis: Secondary | ICD-10-CM | POA: Diagnosis not present

## 2020-04-09 DIAGNOSIS — R2681 Unsteadiness on feet: Secondary | ICD-10-CM | POA: Diagnosis not present

## 2020-04-09 DIAGNOSIS — M797 Fibromyalgia: Secondary | ICD-10-CM | POA: Diagnosis not present

## 2020-04-09 NOTE — Assessment & Plan Note (Signed)
Estrace vaginal cream for atrophic vaginitis.  

## 2020-04-09 NOTE — Progress Notes (Signed)
Location:   [facility] Room Number: 700 Place of Service:  ALF (13) Provider: Marlana Latus NP  Julyanna Scholle X, NP  Patient Care Team: Mattye Verdone X, NP as PCP - General (Internal Medicine) Isatu Macinnes X, NP as Nurse Practitioner (Internal Medicine)  Extended Emergency Contact Information Primary Emergency Contact: Culbreth,Robin Address: [address] [place], [place] [address] [place] Phone: [phone] Mobile Phone: [phone] Relation: Daughter  Code Status:  DNR Goals of care: Advanced Directive information Advanced Directives 02/16/2020  Does Patient Have a Medical Advance Directive? Yes  Type of Paramedic of [place];Living will  Does patient want to make changes to medical advance directive? No - Patient declined  Copy of [place] in Chart? No - copy requested  Would patient like information on creating a medical advance directive? -  Pre-existing out of facility DNR order (yellow form or pink MOST form) -     Chief Complaint  Patient presents with  . Medical Management of Chronic Issues  . Health Maintenance    PPSV23, TDAP, Covid booster    HPI:  Pt is a 87 y.o. female seen today for medical management of chronic diseases.      Hospitalized 02/17/20-02/21/20 for GI bleed/rectal bleed/diverticular bleed, placed on Protonix, EGD-possible mild candida esophagitis,  f/u GI Dr. Carlean Purl. Hgb 9.0 03/13/20             Chronic lower back, leg pain, on Flexeril 2.5mg  qhs, Lyrica 50mg  qd,  HTN, blood pressure is controlled on Metoprolol 12.5mg  bid. Bun/creat 15//0.71 02/20/20             Urinary symptoms, off  Nitrofurantoin 50mg  qd for UTI suppression, Dr. Karsten Ro. Estrace vaginal cream for atrophic vaginitis. Anemia, dropped to 6.8 in hospital s/p 2 units of PRBC,  Vit B12, Hgb 9.0 03/13/20 GERD/chronic gastritis/atrophic gastritis   per biopsy, takes Protonix. F/u GI prn             COPD, takes Breo Ellipta             Fibromyalgia takes Lyrica             Tactile hallucinations, off meds.              Constipation, takes MiraLax 2x/wk.   Past Medical History:  Diagnosis Date  . Acute blood loss anemia 08/23/2013   04/13/16 TSH 1.20, Na 141, K 4.2, Bun 15, creat 0.79, wbc 5.5, Hgb 11.5, plt 283  . Anxiety   . Anxiety state 07/02/2007   Qualifier: Diagnosis of  By: Lenna Gilford MD, Deborra Medina   . Asthma   . 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 Cigarette smoker   . Colon polyps 2009   TWO TUBULAR ADENOMAS AND HYPERPLASTIC POLYPS  . Constipation 09/14/2013  . COPD (chronic obstructive pulmonary disease) ([place])   . Diverticulosis of colon   . DJD (degenerative joint disease)   . Dog bite of limb 07/14/2010   Dog bite 07/10/10 - dogs shots utd Last td was 08/2009  Localized tx only.    . Fibromyalgia   . GERD 12/19/2006   Qualifier: Diagnosis of  By: Julien Girt CMA, Marliss Czar  04/13/16 TSH 1.20, Na 141, K 4.2, Bun 15, creat 0.79, wbc 5.5, Hgb 11.5, plt 283   . GERD (gastroesophageal reflux disease)   . Hammer toe of second toe of left foot 04/08/2016  Left 2nd  . Hearing loss   . Hemorrhoids   . Hypercholesterolemia   . Hypertension   . Osteoarthritis 12/19/2006   Qualifier: Diagnosis of  By: Julien Girt CMA, Marliss Czar    . Osteoporosis    Past Surgical History:  Procedure Laterality Date  . BIOPSY  02/17/2020   Procedure: BIOPSY;  Surgeon: Jackquline Denmark, MD;  Location: [facility];  Service: Endoscopy;;  . CATARACT EXTRACTION, BILATERAL  2009  . COLONOSCOPY  2009, 2006 , 2017  . ESOPHAGOGASTRODUODENOSCOPY (EGD) WITH PROPOFOL N/A 02/17/2020   Procedure: ESOPHAGOGASTRODUODENOSCOPY (EGD) WITH PROPOFOL;  Surgeon: Jackquline Denmark, MD;  Location: [facility];  Service: Endoscopy;  Laterality: N/A;  . IR ANGIOGRAM SELECTIVE EACH ADDITIONAL VESSEL  02/17/2020  . IR ANGIOGRAM SELECTIVE EACH ADDITIONAL VESSEL  02/17/2020  . IR ANGIOGRAM  SELECTIVE EACH ADDITIONAL VESSEL  02/17/2020  . IR ANGIOGRAM SELECTIVE EACH ADDITIONAL VESSEL  02/17/2020  . IR ANGIOGRAM SELECTIVE EACH ADDITIONAL VESSEL  02/17/2020  . IR ANGIOGRAM VISCERAL SELECTIVE  02/17/2020  . IR ANGIOGRAM VISCERAL SELECTIVE  02/17/2020  . IR US GUIDE VASC ACCESS RIGHT  02/17/2020  . stapes surgery     Dr Thornell Mule  . TONSILLECTOMY AND ADENOIDECTOMY     as a child    Allergies  Allergen Reactions  . Penicillins Anaphylaxis  . Bisphosphonates Other (See Comments)    [name]  . Influenza Vaccines     Pt reported  . Simvastatin     Unknown   . Trazodone And Nefazodone     Felt her throat was closing up/kept her awake  . Sulfonamide Derivatives Rash and Other (See Comments)    blisters    Allergies as of 04/09/2020      Reactions   Penicillins Anaphylaxis   Bisphosphonates Other (See Comments)   [name]   Influenza Vaccines    Pt reported   Simvastatin    Unknown    Trazodone And Nefazodone    Felt her throat was closing up/kept her awake   Sulfonamide Derivatives Rash, Other (See Comments)   blisters      Medication List       Accurate as of April 09, 2020  4:00 PM. If you have any questions, ask your nurse or doctor.        STOP taking these medications   fluconazole 200 MG tablet Commonly known as: Diflucan Stopped by: Cassadie Pankonin X Phoenix Riesen, NP     TAKE these medications   budesonide-formoterol 80-4.5 MCG/ACT inhaler Commonly known as: SYMBICORT Inhale 1 puff into the lungs daily.   calcium carbonate 750 MG chewable tablet Commonly known as: TUMS EX Chew 750 mg by mouth daily.   cholecalciferol 1000 units tablet Commonly known as: VITAMIN D Take 1,000 Units by mouth daily.   cyclobenzaprine 5 MG tablet Commonly known as: FLEXERIL Take 2.5 mg by mouth at bedtime.   docusate sodium 100 MG capsule Commonly known as: COLACE Take 100 mg by mouth 2 (two) times daily.   estradiol 0.1 MG/GM vaginal cream Commonly known as:  ESTRACE Place 1 Applicatorful vaginally every Monday, Wednesday, and Friday.   ferrous sulfate 325 (65 FE) MG tablet Take 325 mg by mouth. Once A Day on Mon, Wed, Fri   hydrocortisone cream 1 % Apply 1 application topically 2 (two) times daily as needed (wrists).   metoprolol tartrate 25 MG tablet Commonly known as: LOPRESSOR Take 12.5 mg by mouth 2 (two) times daily.   nitrofurantoin 50 MG capsule Commonly known  as: MACRODANTIN Take 50 mg by mouth at bedtime.   pantoprazole 40 MG tablet Commonly known as: PROTONIX Take 1 tablet (40 mg total) by mouth daily at 6 (six) AM.   polyethylene glycol 17 g packet Commonly known as: MIRALAX / GLYCOLAX Take 17 g by mouth 2 (two) times a week. Sunday and Wednesday   pregabalin 50 MG capsule Commonly known as: LYRICA Take 1 capsule (50 mg total) by mouth daily.   Tubersol 5 UNIT/0.1ML injection Generic drug: tuberculin Inject 5 Units into the skin once.   vitamin B-12 1000 MCG tablet Commonly known as: CYANOCOBALAMIN Take 1,000 mcg by mouth daily.       Review of Systems  Constitutional: Negative for fatigue, fever and unexpected weight change.  HENT: Positive for hearing loss. Negative for congestion and voice change.   Eyes: Negative for visual disturbance.  Respiratory: Positive for cough. Negative for shortness of breath and wheezing.   Cardiovascular: Negative for leg swelling.  Gastrointestinal: Positive for constipation. Negative for abdominal pain.  Genitourinary: Positive for dysuria. Negative for urgency.       Chronic, on and off  Musculoskeletal: Positive for arthralgias, back pain, gait problem and myalgias.  Skin: Negative for color change.  Neurological: Negative for dizziness, speech difficulty and weakness.  Psychiatric/Behavioral: Negative for behavioral problems, hallucinations and sleep disturbance.       Chronic going to bed late, sleeping in late    Immunization History  Administered Date(s)  Administered  . Moderna Sars-Covid-2 Vaccination 02/17/2019, 03/17/2019  . Pneumococcal Conjugate-13 09/01/2015  . Pneumococcal Polysaccharide-23 07/07/1998  . Td 08/21/2009   Pertinent  Health Maintenance Due  Topic Date Due  . PNA vac Low Risk Adult (2 of 2 - PPSV23) 08/31/2016  . DEXA SCAN  Completed  . INFLUENZA VACCINE  Discontinued   Fall Risk  11/08/2017 11/05/2016 09/01/2015 08/31/2012  Falls in the past year? No Yes Yes No  Number falls in past yr: - 2 or more 1 -  Injury with Fall? - Yes No -  Risk for fall due to : - - Impaired balance/gait -   Functional Status Survey:    Vitals:   04/09/20 0950  BP: (!) 136/58  Pulse: (!) 58  Resp: 18  Temp: 98 F (36.7 C)  SpO2: 96%  Weight: 134 lb (60.8 kg)  Height: 5\' 1"  (1.549 m)   Body mass index is 25.32 kg/m. Physical Exam Vitals and nursing note reviewed.  Constitutional:      Appearance: Normal appearance.  HENT:     Head: Normocephalic and atraumatic.     Nose: Nose normal.     Mouth/Throat:     Mouth: Mucous membranes are moist.  Eyes:     Extraocular Movements: Extraocular movements intact.     Conjunctiva/sclera: Conjunctivae normal.     Pupils: Pupils are equal, round, and reactive to light.  Cardiovascular:     Rate and Rhythm: Normal rate and regular rhythm.     Heart sounds: No murmur heard.   Pulmonary:     Effort: Pulmonary effort is normal.     Breath sounds: No rales.     Comments: Decreased air entry to both lungs.  Abdominal:     General: Bowel sounds are normal.     Palpations: Abdomen is soft.     Tenderness: There is no abdominal tenderness. There is no right CVA tenderness, left CVA tenderness, guarding or rebound.  Musculoskeletal:     Cervical back: Normal range of  motion and neck supple.     Right lower leg: No edema.     Left lower leg: No edema.  Skin:    General: Skin is warm and dry.  Neurological:     General: No focal deficit present.     Mental Status: She is alert.  Mental status is at baseline.     Motor: No weakness.     Coordination: Coordination normal.     Gait: Gait abnormal.     Comments: Oriented to person, place.   Psychiatric:        Mood and Affect: Mood normal.        Behavior: Behavior normal.     Labs reviewed: Recent Labs    02/18/20 0115 02/19/20 0254 02/20/20 0909 02/28/20 0000  NA 136 139 140 142  K 3.7 3.4* 3.4* 4.3  CL 108 108 108 107  CO2 21* 23 24 28*  GLUCOSE 122* 125* 104*  --   BUN 16 19 15 13   CREATININE 0.58 0.65 0.71 0.9  CALCIUM 7.8* 8.5* 8.2* 9.1   Recent Labs    10/02/19 0000 12/25/19 0000 02/16/20 0408  AST 15 13 18   ALT 10 7 15   ALKPHOS 71 81 72  BILITOT  --   --  0.6  PROT  --   --  6.0*  ALBUMIN 3.7 3.6 3.8   Recent Labs    10/02/19 0000 01/23/20 0000 02/18/20 0115 02/19/20 0254 02/20/20 0909 02/28/20 0000 03/13/20 0000  WBC 4.6   < > 9.8 7.5 6.1 5.9 4.3  NEUTROABS 2,157  --   --   --   --  3,322.00 2,391.00  HGB 10.9*   < > 7.7* 6.8* 7.7* 8.6* 9.0*  HCT 32*   < > 23.2* 20.7* 23.1* 26* 28*  MCV  --    < > 92.4 92.8 87.8  --   --   PLT 182   < > 165 156 158 312 214   < > = values in this interval not displayed.   Lab Results  Component Value Date   TSH 1.92 01/10/2019   Lab Results  Component Value Date   HGBA1C 5.8 02/17/2017   Lab Results  Component Value Date   CHOL 177 01/10/2019   HDL 52 01/10/2019   LDLCALC 104 01/10/2019   LDLDIRECT 114.7 08/18/2011   TRIG 110 01/10/2019   CHOLHDL 2 01/23/2015    Significant Diagnostic Results in last 30 days:  No results found.  Assessment/Plan  Slow transit constipation Constipated, hard stools, will increase MiraLax to daily   Tactile hallucination Stable, off meds  Fibromyalgia Managed, continue Lyrica  COPD (chronic obstructive pulmonary disease) with chronic bronchitis ([place]) takes Breo Ellipta   GERD GERD/chronic gastritis/atrophic gastritis  per biopsy, takes Protonix. F/u GI prn  ABLA (acute blood loss  anemia) dropped to 6.8 in hospital s/p 2 units of PRBC,  Vit B12, Hgb 9.0 03/13/20   Atrophic vaginitis Estrace vaginal cream for atrophic vaginitis.  Dysuria off  Nitrofurantoin 50mg  qd for UTI suppression, Dr. Karsten Ro.10/10/18 Cystoscopy.   Hypertension blood pressure is controlled on Metoprolol 12.5mg  bid. Bun/creat 15//0.71 02/20/20   Osteoarthritis Chronic lower back, leg pain, on Flexeril 2.5mg  qhs, Lyrica 50mg  qd,     Family/ staff Communication: plan of care reviewed with the patient and charge nurse.   Labs/tests ordered: none  Time spend 40 minutes.

## 2020-04-09 NOTE — Assessment & Plan Note (Signed)
takes Breo Ellipta 

## 2020-04-09 NOTE — Assessment & Plan Note (Signed)
GERD/chronic gastritis/atrophic gastritis  per biopsy, takes Protonix. F/u GI prn

## 2020-04-09 NOTE — Assessment & Plan Note (Signed)
Chronic lower back, leg pain, on Flexeril 2.5mg qhs, Lyrica 50mg qd, 

## 2020-04-09 NOTE — Assessment & Plan Note (Signed)
off  Nitrofurantoin 50mg  qd for UTI suppression, Dr. Karsten Ro.10/10/18 Cystoscopy.

## 2020-04-09 NOTE — Assessment & Plan Note (Signed)
dropped to 6.8 in hospital s/p 2 units of PRBC,  Vit B12, Hgb 9.0 03/13/20 

## 2020-04-09 NOTE — Assessment & Plan Note (Signed)
Stable, off meds.  

## 2020-04-09 NOTE — Assessment & Plan Note (Signed)
blood pressure is controlled on Metoprolol 12.5mg  bid. Bun/creat 15//0.71 02/20/20

## 2020-04-09 NOTE — Assessment & Plan Note (Signed)
Managed, continue Lyrica

## 2020-04-09 NOTE — Assessment & Plan Note (Signed)
Constipated, hard stools, will increase MiraLax to daily

## 2020-04-10 DIAGNOSIS — D649 Anemia, unspecified: Secondary | ICD-10-CM | POA: Diagnosis not present

## 2020-04-10 DIAGNOSIS — R531 Weakness: Secondary | ICD-10-CM | POA: Diagnosis not present

## 2020-04-10 DIAGNOSIS — K922 Gastrointestinal hemorrhage, unspecified: Secondary | ICD-10-CM | POA: Diagnosis not present

## 2020-04-10 DIAGNOSIS — M6281 Muscle weakness (generalized): Secondary | ICD-10-CM | POA: Diagnosis not present

## 2020-04-10 DIAGNOSIS — R5383 Other fatigue: Secondary | ICD-10-CM | POA: Diagnosis not present

## 2020-04-10 DIAGNOSIS — R2681 Unsteadiness on feet: Secondary | ICD-10-CM | POA: Diagnosis not present

## 2020-04-14 DIAGNOSIS — R5383 Other fatigue: Secondary | ICD-10-CM | POA: Diagnosis not present

## 2020-04-14 DIAGNOSIS — M6281 Muscle weakness (generalized): Secondary | ICD-10-CM | POA: Diagnosis not present

## 2020-04-14 DIAGNOSIS — L814 Other melanin hyperpigmentation: Secondary | ICD-10-CM | POA: Diagnosis not present

## 2020-04-14 DIAGNOSIS — R2681 Unsteadiness on feet: Secondary | ICD-10-CM | POA: Diagnosis not present

## 2020-04-14 DIAGNOSIS — K922 Gastrointestinal hemorrhage, unspecified: Secondary | ICD-10-CM | POA: Diagnosis not present

## 2020-04-14 DIAGNOSIS — D1801 Hemangioma of skin and subcutaneous tissue: Secondary | ICD-10-CM | POA: Diagnosis not present

## 2020-04-14 DIAGNOSIS — L821 Other seborrheic keratosis: Secondary | ICD-10-CM | POA: Diagnosis not present

## 2020-04-14 DIAGNOSIS — D649 Anemia, unspecified: Secondary | ICD-10-CM | POA: Diagnosis not present

## 2020-04-14 DIAGNOSIS — Z85828 Personal history of other malignant neoplasm of skin: Secondary | ICD-10-CM | POA: Diagnosis not present

## 2020-04-14 DIAGNOSIS — L570 Actinic keratosis: Secondary | ICD-10-CM | POA: Diagnosis not present

## 2020-04-14 DIAGNOSIS — R531 Weakness: Secondary | ICD-10-CM | POA: Diagnosis not present

## 2020-04-15 ENCOUNTER — Other Ambulatory Visit: Payer: Self-pay | Admitting: *Deleted

## 2020-04-15 MED ORDER — PREGABALIN 50 MG PO CAPS: 50.0000 mg | ORAL_CAPSULE | Freq: Every day | ORAL | 0 refills | Status: DC

## 2020-04-15 NOTE — Telephone Encounter (Signed)
Received refill Request from [facility] and sent to [facility] for approval.

## 2020-04-16 DIAGNOSIS — D649 Anemia, unspecified: Secondary | ICD-10-CM | POA: Diagnosis not present

## 2020-04-16 DIAGNOSIS — R5383 Other fatigue: Secondary | ICD-10-CM | POA: Diagnosis not present

## 2020-04-16 DIAGNOSIS — K922 Gastrointestinal hemorrhage, unspecified: Secondary | ICD-10-CM | POA: Diagnosis not present

## 2020-04-16 DIAGNOSIS — R531 Weakness: Secondary | ICD-10-CM | POA: Diagnosis not present

## 2020-04-16 DIAGNOSIS — M6281 Muscle weakness (generalized): Secondary | ICD-10-CM | POA: Diagnosis not present

## 2020-04-17 DIAGNOSIS — D649 Anemia, unspecified: Secondary | ICD-10-CM | POA: Diagnosis not present

## 2020-04-17 DIAGNOSIS — M6281 Muscle weakness (generalized): Secondary | ICD-10-CM | POA: Diagnosis not present

## 2020-04-17 DIAGNOSIS — R5383 Other fatigue: Secondary | ICD-10-CM | POA: Diagnosis not present

## 2020-04-17 DIAGNOSIS — K922 Gastrointestinal hemorrhage, unspecified: Secondary | ICD-10-CM | POA: Diagnosis not present

## 2020-04-17 DIAGNOSIS — R531 Weakness: Secondary | ICD-10-CM | POA: Diagnosis not present

## 2020-04-22 DIAGNOSIS — D649 Anemia, unspecified: Secondary | ICD-10-CM | POA: Diagnosis not present

## 2020-04-22 DIAGNOSIS — K922 Gastrointestinal hemorrhage, unspecified: Secondary | ICD-10-CM | POA: Diagnosis not present

## 2020-04-22 DIAGNOSIS — R531 Weakness: Secondary | ICD-10-CM | POA: Diagnosis not present

## 2020-04-22 DIAGNOSIS — M6281 Muscle weakness (generalized): Secondary | ICD-10-CM | POA: Diagnosis not present

## 2020-04-22 DIAGNOSIS — R5383 Other fatigue: Secondary | ICD-10-CM | POA: Diagnosis not present

## 2020-04-24 DIAGNOSIS — R5383 Other fatigue: Secondary | ICD-10-CM | POA: Diagnosis not present

## 2020-04-24 DIAGNOSIS — D649 Anemia, unspecified: Secondary | ICD-10-CM | POA: Diagnosis not present

## 2020-04-24 DIAGNOSIS — K922 Gastrointestinal hemorrhage, unspecified: Secondary | ICD-10-CM | POA: Diagnosis not present

## 2020-04-24 DIAGNOSIS — M6281 Muscle weakness (generalized): Secondary | ICD-10-CM | POA: Diagnosis not present

## 2020-04-24 DIAGNOSIS — R531 Weakness: Secondary | ICD-10-CM | POA: Diagnosis not present

## 2020-04-25 DIAGNOSIS — R531 Weakness: Secondary | ICD-10-CM | POA: Diagnosis not present

## 2020-04-25 DIAGNOSIS — D649 Anemia, unspecified: Secondary | ICD-10-CM | POA: Diagnosis not present

## 2020-04-25 DIAGNOSIS — K922 Gastrointestinal hemorrhage, unspecified: Secondary | ICD-10-CM | POA: Diagnosis not present

## 2020-04-25 DIAGNOSIS — M6281 Muscle weakness (generalized): Secondary | ICD-10-CM | POA: Diagnosis not present

## 2020-04-25 DIAGNOSIS — R5383 Other fatigue: Secondary | ICD-10-CM | POA: Diagnosis not present

## 2020-04-28 DIAGNOSIS — K922 Gastrointestinal hemorrhage, unspecified: Secondary | ICD-10-CM | POA: Diagnosis not present

## 2020-04-28 DIAGNOSIS — R531 Weakness: Secondary | ICD-10-CM | POA: Diagnosis not present

## 2020-04-28 DIAGNOSIS — D649 Anemia, unspecified: Secondary | ICD-10-CM | POA: Diagnosis not present

## 2020-04-28 DIAGNOSIS — M6281 Muscle weakness (generalized): Secondary | ICD-10-CM | POA: Diagnosis not present

## 2020-04-28 DIAGNOSIS — R5383 Other fatigue: Secondary | ICD-10-CM | POA: Diagnosis not present

## 2020-04-30 DIAGNOSIS — R531 Weakness: Secondary | ICD-10-CM | POA: Diagnosis not present

## 2020-04-30 DIAGNOSIS — K922 Gastrointestinal hemorrhage, unspecified: Secondary | ICD-10-CM | POA: Diagnosis not present

## 2020-04-30 DIAGNOSIS — M6281 Muscle weakness (generalized): Secondary | ICD-10-CM | POA: Diagnosis not present

## 2020-04-30 DIAGNOSIS — D649 Anemia, unspecified: Secondary | ICD-10-CM | POA: Diagnosis not present

## 2020-04-30 DIAGNOSIS — R5383 Other fatigue: Secondary | ICD-10-CM | POA: Diagnosis not present

## 2020-05-29 DIAGNOSIS — L570 Actinic keratosis: Secondary | ICD-10-CM | POA: Diagnosis not present

## 2020-05-29 DIAGNOSIS — L821 Other seborrheic keratosis: Secondary | ICD-10-CM | POA: Diagnosis not present

## 2020-05-29 DIAGNOSIS — Z85828 Personal history of other malignant neoplasm of skin: Secondary | ICD-10-CM | POA: Diagnosis not present

## 2020-05-29 DIAGNOSIS — L814 Other melanin hyperpigmentation: Secondary | ICD-10-CM | POA: Diagnosis not present

## 2020-06-24 DIAGNOSIS — L821 Other seborrheic keratosis: Secondary | ICD-10-CM | POA: Diagnosis not present

## 2020-06-24 DIAGNOSIS — L814 Other melanin hyperpigmentation: Secondary | ICD-10-CM | POA: Diagnosis not present

## 2020-06-24 DIAGNOSIS — L570 Actinic keratosis: Secondary | ICD-10-CM | POA: Diagnosis not present

## 2020-06-24 DIAGNOSIS — Z85828 Personal history of other malignant neoplasm of skin: Secondary | ICD-10-CM | POA: Diagnosis not present

## 2020-08-06 ENCOUNTER — Non-Acute Institutional Stay: Payer: Medicare Other | Admitting: Nurse Practitioner

## 2020-08-06 ENCOUNTER — Encounter: Payer: Self-pay | Admitting: Nurse Practitioner

## 2020-08-06 DIAGNOSIS — N952 Postmenopausal atrophic vaginitis: Secondary | ICD-10-CM | POA: Diagnosis not present

## 2020-08-06 DIAGNOSIS — R442 Other hallucinations: Secondary | ICD-10-CM

## 2020-08-06 DIAGNOSIS — I10 Essential (primary) hypertension: Secondary | ICD-10-CM | POA: Diagnosis not present

## 2020-08-06 DIAGNOSIS — M159 Polyosteoarthritis, unspecified: Secondary | ICD-10-CM

## 2020-08-06 DIAGNOSIS — M797 Fibromyalgia: Secondary | ICD-10-CM

## 2020-08-06 DIAGNOSIS — D649 Anemia, unspecified: Secondary | ICD-10-CM | POA: Diagnosis not present

## 2020-08-06 DIAGNOSIS — K922 Gastrointestinal hemorrhage, unspecified: Secondary | ICD-10-CM

## 2020-08-06 DIAGNOSIS — K219 Gastro-esophageal reflux disease without esophagitis: Secondary | ICD-10-CM | POA: Diagnosis not present

## 2020-08-06 DIAGNOSIS — N390 Urinary tract infection, site not specified: Secondary | ICD-10-CM

## 2020-08-06 DIAGNOSIS — J449 Chronic obstructive pulmonary disease, unspecified: Secondary | ICD-10-CM | POA: Diagnosis not present

## 2020-08-06 DIAGNOSIS — K5901 Slow transit constipation: Secondary | ICD-10-CM | POA: Diagnosis not present

## 2020-08-06 DIAGNOSIS — G629 Polyneuropathy, unspecified: Secondary | ICD-10-CM | POA: Insufficient documentation

## 2020-08-06 NOTE — Assessment & Plan Note (Signed)
takes Lisa King

## 2020-08-06 NOTE — Assessment & Plan Note (Signed)
dropped to 6.8 in hospital s/p 2 units of PRBC,  Vit B12, Hgb 9.0 03/13/20

## 2020-08-06 NOTE — Assessment & Plan Note (Signed)
Stable, continue MiraLax.  °

## 2020-08-06 NOTE — Assessment & Plan Note (Signed)
Estrace vaginal cream for atrophic vaginitis.  

## 2020-08-06 NOTE — Assessment & Plan Note (Signed)
Aches/pains allover, continue Lyrica

## 2020-08-06 NOTE — Assessment & Plan Note (Signed)
GERD/chronic gastritis/atrophic gastritis  per biopsy, takes Protonix. F/u GI prn

## 2020-08-06 NOTE — Assessment & Plan Note (Signed)
pain all over, mainly in legs/feet, had x1 Lyrica last, it was effective. The patient takes Lyrica qam for fibromyalgia already. Will continue Lyrica 58m qam, adding Lyrica 25 mg qhs. Update CBC/diff, CMP/eGFR.

## 2020-08-06 NOTE — Assessment & Plan Note (Signed)
Off meds, no complains today.

## 2020-08-06 NOTE — Assessment & Plan Note (Signed)
Urinary symptoms, off  Nitrofurantoin 50mg  qd for UTI suppression, Dr. Karsten Ro.

## 2020-08-06 NOTE — Assessment & Plan Note (Signed)
Hospitalized 02/17/20-02/21/20 for GI bleed/rectal bleed/diverticular bleed, placed on Protonix, EGD-possible mild candida esophagitis,  f/u GI Dr. Carlean Purl. Hgb 9.0 03/13/20. Update CBC/diff.

## 2020-08-06 NOTE — Progress Notes (Signed)
Location:   [facility] Room Number: [id number] Place of Service:  ALF [id number]) Provider:  Kaamil Morefield X Krystianna Soth, NP  Anayeli Arel X, NP  Patient Care Team: Jeison Delpilar X, NP as PCP - General (Internal Medicine) Nakeeta Sebastiani X, NP as Nurse Practitioner (Internal Medicine)  Extended Emergency Contact Information Primary Emergency Contact: Culbreth,Robin Address: [address] [place], [place] [address] [place] Phone: [phone] Mobile Phone: [phone] Relation: Daughter  Code Status:  FULL CODE Goals of care: Advanced Directive information Advanced Directives 08/06/2020  Does Patient Have a Medical Advance Directive? Yes  Type of Advance Directive Living will  Does patient want to make changes to medical advance directive? No - Patient declined  Copy of [place] in Chart? -  Would patient like information on creating a medical advance directive? -  Pre-existing out of facility DNR order (yellow form or pink MOST form) -     Chief Complaint  Patient presents with   Acute Visit    Patient complains of leg pain.     HPI:  Pt is a 87 y.o. female seen today for an acute visit for pain all over, mainly in legs/feet, had x1 Lyrica last, it was effective.    Hospitalized 02/17/20-02/21/20 for GI bleed/rectal bleed/diverticular bleed, placed on Protonix, EGD-possible mild candida esophagitis,  f/u GI Dr. Carlean Purl. Hgb 9.0 03/13/20             Chronic lower back, leg pain, on Flexeril 2.738m qhs, Lyrica 578mqd,             HTN, blood pressure is controlled on Metoprolol 12.38m47mid. Bun/creat 15//0.71 02/20/20             Urinary symptoms, off  Nitrofurantoin 38m81m for UTI suppression, Dr. OtteKarsten Ro           Estrace vaginal cream for atrophic vaginitis.              Anemia, dropped to 6.8 in hospital s/p 2 units of PRBC,  Vit B12, Hgb 9.0 03/13/20             GERD/chronic gastritis/atrophic gastritis  per biopsy, takes Protonix.  F/u GI prn             COPD, takes Breo Ellipta             Fibromyalgia takes Lyrica             Tactile hallucinations, off meds.             Constipation, takes MiraLax 2x/wk.  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     Fibromyalgia    GERD  12/19/2006   Qualifier: Diagnosis of  By: Julien Girt CMA, Marliss Czar  04/13/16 TSH 1.20, Na 141, K 4.2, Bun 15, creat 0.79, wbc 5.5, Hgb 11.5, plt 283    GERD (gastroesophageal reflux disease)    Hammer toe of second toe of left foot 04/08/2016   Left 2nd   Hearing loss    Hemorrhoids    Hypercholesterolemia    Hypertension    Osteoarthritis 12/19/2006   Qualifier: Diagnosis of  By: Julien Girt CMA, Leigh     Osteoporosis    Past Surgical History:  Procedure Laterality Date   BIOPSY  02/17/2020   Procedure: BIOPSY;  Surgeon: Jackquline Denmark, MD;  Location: [facility];  Service: Endoscopy;;   CATARACT EXTRACTION, BILATERAL  2009   COLONOSCOPY  2009, 2006 , 2017   ESOPHAGOGASTRODUODENOSCOPY (EGD) WITH PROPOFOL N/A 02/17/2020   Procedure: ESOPHAGOGASTRODUODENOSCOPY (EGD) WITH PROPOFOL;  Surgeon: Jackquline Denmark, MD;  Location: [facility];  Service: Endoscopy;  Laterality: N/A;   IR ANGIOGRAM SELECTIVE EACH ADDITIONAL VESSEL  02/17/2020   IR ANGIOGRAM SELECTIVE EACH ADDITIONAL VESSEL  02/17/2020   IR ANGIOGRAM SELECTIVE EACH ADDITIONAL VESSEL  02/17/2020   IR ANGIOGRAM  SELECTIVE EACH ADDITIONAL VESSEL  02/17/2020   IR ANGIOGRAM SELECTIVE EACH ADDITIONAL VESSEL  02/17/2020   IR ANGIOGRAM VISCERAL SELECTIVE  02/17/2020   IR ANGIOGRAM VISCERAL SELECTIVE  02/17/2020   IR US GUIDE VASC ACCESS RIGHT  02/17/2020   stapes surgery     Dr Thornell Mule   TONSILLECTOMY AND ADENOIDECTOMY     as a child    Allergies  Allergen Reactions   Penicillins Anaphylaxis   Bisphosphonates Other (See Comments)    [name]   Influenza Vaccines     Pt reported   Simvastatin     Unknown    Trazodone And Nefazodone     Felt her throat was closing up/kept her awake   Sulfonamide Derivatives Rash and Other (See Comments)    blisters    Allergies as of 08/06/2020       Reactions   Penicillins Anaphylaxis   Bisphosphonates Other (See Comments)   [name]   Influenza Vaccines    Pt reported   Simvastatin    Unknown    Trazodone And Nefazodone    Felt her throat was closing up/kept her awake   Sulfonamide Derivatives Rash, Other (See Comments)   blisters        Medication List        Accurate as of August 06, 2020 10:48 AM. If you have any questions, ask your nurse or doctor.          STOP taking these medications    pantoprazole 40 MG tablet Commonly known as: PROTONIX Stopped by: Lossie Kalp X Arnice Vanepps, NP   Tubersol 5 UNIT/0.1ML injection Generic drug: tuberculin Stopped by: Normal Recinos X Melany Wiesman, NP       TAKE these medications    budesonide-formoterol 80-4.5 MCG/ACT inhaler Commonly known as: SYMBICORT Inhale 1 puff into the lungs daily.   calcium carbonate 750 MG chewable tablet Commonly known as: TUMS EX Chew 750 mg by mouth daily.   cholecalciferol 1000 units tablet Commonly known as: VITAMIN D Take 1,000 Units by mouth daily.   cyclobenzaprine 5 MG tablet Commonly known as: FLEXERIL Take 2.5 mg by mouth at bedtime.   docusate sodium 100 MG capsule Commonly known as: COLACE Take 100 mg by mouth 2 (two) times daily.   estradiol 0.1 MG/GM vaginal  cream Commonly known as: ESTRACE Place  1 Applicatorful vaginally every Monday, Wednesday, and Friday.   ferrous sulfate 325 (65 FE) MG tablet Take 325 mg by mouth. Once A Day on Mon, Wed, Fri   hydrocortisone cream 1 % Apply 1 application topically 2 (two) times daily as needed (wrists).   metoprolol tartrate 25 MG tablet Commonly known as: LOPRESSOR Take 12.5 mg by mouth 2 (two) times daily.   nitrofurantoin 50 MG capsule Commonly known as: MACRODANTIN Take 50 mg by mouth at bedtime.   polyethylene glycol 17 g packet Commonly known as: MIRALAX / GLYCOLAX Take 17 g by mouth 2 (two) times a week. Sunday and Wednesday   pregabalin 50 MG capsule Commonly known as: LYRICA Take 1 capsule (50 mg total) by mouth daily.   vitamin B-12 1000 MCG tablet Commonly known as: CYANOCOBALAMIN Take 1,000 mcg by mouth daily.        Review of Systems  Constitutional:  Negative for activity change, appetite change and fever.  HENT:  Positive for hearing loss. Negative for congestion and voice change.   Eyes:  Negative for visual disturbance.  Respiratory:  Negative for cough, shortness of breath and wheezing.   Cardiovascular:  Negative for leg swelling.  Gastrointestinal:  Negative for abdominal pain and constipation.  Genitourinary:  Negative for dysuria and urgency.       Chronic, on and off  Musculoskeletal:  Positive for arthralgias, back pain, gait problem and myalgias.  Skin:  Negative for color change.  Neurological:  Negative for speech difficulty, weakness, light-headedness and headaches.  Psychiatric/Behavioral:  Negative for behavioral problems, hallucinations and sleep disturbance.        Chronic going to bed late, sleeping in late   Immunization History  Administered Date(s) Administered   Moderna Sars-Covid-2 Vaccination 02/17/2019, 03/17/2019   Pneumococcal Conjugate-13 09/01/2015   Pneumococcal Polysaccharide-23 07/07/1998   Td 08/21/2009   Pertinent  Health  Maintenance Due  Topic Date Due   PNA vac Low Risk Adult (2 of 2 - PPSV23) 08/31/2016   DEXA SCAN  Completed   INFLUENZA VACCINE  Discontinued   Fall Risk  11/08/2017 11/05/2016 09/01/2015 08/31/2012  Falls in the past year? No Yes Yes No  Number falls in past yr: - 2 or more 1 -  Injury with Fall? - Yes No -  Risk for fall due to : - - Impaired balance/gait -   Functional Status Survey:    Vitals:   08/06/20 0926  BP: 130/68  Pulse: 74  Resp: 20  Temp: (!) 96.7 F (35.9 C)  SpO2: 96%  Weight: 132 lb 12.8 oz (60.2 kg)  Height: 5' 1"  (1.549 m)   Body mass index is 25.09 kg/m. Physical Exam Vitals and nursing note reviewed.  Constitutional:      Appearance: Normal appearance.  HENT:     Head: Normocephalic and atraumatic.     Mouth/Throat:     Mouth: Mucous membranes are moist.  Eyes:     Extraocular Movements: Extraocular movements intact.     Conjunctiva/sclera: Conjunctivae normal.     Pupils: Pupils are equal, round, and reactive to light.  Cardiovascular:     Rate and Rhythm: Normal rate and regular rhythm.     Heart sounds: No murmur heard. Pulmonary:     Effort: Pulmonary effort is normal.     Breath sounds: No rales.     Comments: Decreased air entry to both lungs.  Abdominal:     General: Bowel sounds are normal.     Palpations:  Abdomen is soft.     Tenderness: There is no abdominal tenderness.  Musculoskeletal:     Cervical back: Normal range of motion and neck supple.     Right lower leg: No edema.     Left lower leg: No edema.  Skin:    General: Skin is warm and dry.  Neurological:     General: No focal deficit present.     Mental Status: She is alert. Mental status is at baseline.     Motor: No weakness.     Coordination: Coordination normal.     Gait: Gait abnormal.     Comments: Oriented to person, place.   Psychiatric:        Mood and Affect: Mood normal.        Behavior: Behavior normal.    Labs reviewed: Recent Labs    02/18/20 0115  02/19/20 0254 02/20/20 0909 02/28/20 0000  NA 136 139 140 142  K 3.7 3.4* 3.4* 4.3  CL 108 108 108 107  CO2 21* 23 24 28*  GLUCOSE 122* 125* 104*  --   BUN 16 19 15 13   CREATININE 0.58 0.65 0.71 0.9  CALCIUM 7.8* 8.5* 8.2* 9.1   Recent Labs    10/02/19 0000 12/25/19 0000 02/16/20 0408  AST 15 13 18   ALT 10 7 15   ALKPHOS 71 81 72  BILITOT  --   --  0.6  PROT  --   --  6.0*  ALBUMIN 3.7 3.6 3.8   Recent Labs    10/02/19 0000 01/23/20 0000 02/18/20 0115 02/19/20 0254 02/20/20 0909 02/28/20 0000 03/13/20 0000  WBC 4.6   < > 9.8 7.5 6.1 5.9 4.3  NEUTROABS 2,157  --   --   --   --  3,322.00 2,391.00  HGB 10.9*   < > 7.7* 6.8* 7.7* 8.6* 9.0*  HCT 32*   < > 23.2* 20.7* 23.1* 26* 28*  MCV  --    < > 92.4 92.8 87.8  --   --   PLT 182   < > 165 156 158 312 214   < > = values in this interval not displayed.   Lab Results  Component Value Date   TSH 1.92 01/10/2019   Lab Results  Component Value Date   HGBA1C 5.8 02/17/2017   Lab Results  Component Value Date   CHOL 177 01/10/2019   HDL 52 01/10/2019   LDLCALC 104 01/10/2019   LDLDIRECT 114.7 08/18/2011   TRIG 110 01/10/2019   CHOLHDL 2 01/23/2015    Significant Diagnostic Results in last 30 days:  No results found.  Assessment/Plan Peripheral neuropathy pain all over, mainly in legs/feet, had x1 Lyrica last, it was effective. The patient takes Lyrica qam for fibromyalgia already. Will continue Lyrica 60m qam, adding Lyrica 25 mg qhs. Update CBC/diff, CMP/eGFR.   GI bleed Hospitalized 02/17/20-02/21/20 for GI bleed/rectal bleed/diverticular bleed, placed on Protonix, EGD-possible mild candida esophagitis,  f/u GI Dr. GCarlean Purl Hgb 9.0 03/13/20. Update CBC/diff.   Osteoarthritis Chronic lower back, leg pain, on Flexeril 2.591mqhs, Lyrica 5032md,  Hypertension blood pressure is controlled on Metoprolol 12.5mg71md. Bun/creat 15//0.71 02/20/20, update CMP/eGFR  UTI (urinary tract infection) Urinary symptoms, off   Nitrofurantoin 50mg68mfor UTI suppression, Dr. OttelKarsten Rotrophic vaginitis Estrace vaginal cream for atrophic vaginitis.   Chronic anemia dropped to 6.8 in hospital s/p 2 units of PRBC,  Vit B12, Hgb 9.0 03/13/20  GERD GERD/chronic gastritis/atrophic gastritis  per biopsy, takes Protonix. F/u GI prn  COPD (chronic obstructive pulmonary disease) with chronic bronchitis (HCC) takes Breo Ellipta  Fibromyalgia Aches/pains allover, continue Lyrica  Tactile hallucination Off meds, no complains today.     Family/ staff Communication: plan of care reviewed with the patient and charge nurse.   Labs/tests ordered:  CBC/diff, CMP/eGFR  Time spend 40 minutes.

## 2020-08-06 NOTE — Assessment & Plan Note (Signed)
Chronic lower back, leg pain, on Flexeril 2.5mg  qhs, Lyrica 50mg  qd,

## 2020-08-06 NOTE — Assessment & Plan Note (Signed)
blood pressure is controlled on Metoprolol 12.91m bid. Bun/creat 15//0.71 02/20/20, update CMP/eGFR

## 2020-08-07 DIAGNOSIS — D649 Anemia, unspecified: Secondary | ICD-10-CM | POA: Diagnosis not present

## 2020-08-07 DIAGNOSIS — I10 Essential (primary) hypertension: Secondary | ICD-10-CM | POA: Diagnosis not present

## 2020-08-07 LAB — CBC AND DIFFERENTIAL
HCT: 35 — AB (ref 36–46)
Hemoglobin: 11.6 — AB (ref 12.0–16.0)
Neutrophils Absolute: 2171
Platelets: 203 (ref 150–399)
WBC: 4.2

## 2020-08-07 LAB — COMPREHENSIVE METABOLIC PANEL
Albumin: 3.6 (ref 3.5–5.0)
Calcium: 8.9 (ref 8.7–10.7)
GFR calc Af Amer: 75
GFR calc non Af Amer: 65
Globulin: 1.8

## 2020-08-07 LAB — HEPATIC FUNCTION PANEL
ALT: 10 (ref 7–35)
AST: 15 (ref 13–35)
Alkaline Phosphatase: 78 (ref 25–125)
Bilirubin, Total: 0.5

## 2020-08-07 LAB — BASIC METABOLIC PANEL
BUN: 15 (ref 4–21)
CO2: 27 — AB (ref 13–22)
Chloride: 107 (ref 99–108)
Creatinine: 0.8 (ref 0.5–1.1)
Glucose: 83
Potassium: 4.1 (ref 3.4–5.3)
Sodium: 143 (ref 137–147)

## 2020-08-07 LAB — CBC: RBC: 3.87 (ref 3.87–5.11)

## 2020-08-15 ENCOUNTER — Non-Acute Institutional Stay: Payer: Medicare Other | Admitting: Nurse Practitioner

## 2020-08-15 ENCOUNTER — Encounter: Payer: Self-pay | Admitting: Nurse Practitioner

## 2020-08-15 DIAGNOSIS — M797 Fibromyalgia: Secondary | ICD-10-CM

## 2020-08-15 DIAGNOSIS — R442 Other hallucinations: Secondary | ICD-10-CM | POA: Diagnosis not present

## 2020-08-15 DIAGNOSIS — N952 Postmenopausal atrophic vaginitis: Secondary | ICD-10-CM | POA: Diagnosis not present

## 2020-08-15 DIAGNOSIS — H538 Other visual disturbances: Secondary | ICD-10-CM

## 2020-08-15 DIAGNOSIS — K5901 Slow transit constipation: Secondary | ICD-10-CM

## 2020-08-15 DIAGNOSIS — H3530 Unspecified macular degeneration: Secondary | ICD-10-CM | POA: Insufficient documentation

## 2020-08-15 DIAGNOSIS — K219 Gastro-esophageal reflux disease without esophagitis: Secondary | ICD-10-CM

## 2020-08-15 DIAGNOSIS — G629 Polyneuropathy, unspecified: Secondary | ICD-10-CM

## 2020-08-15 DIAGNOSIS — D649 Anemia, unspecified: Secondary | ICD-10-CM | POA: Diagnosis not present

## 2020-08-15 DIAGNOSIS — I10 Essential (primary) hypertension: Secondary | ICD-10-CM

## 2020-08-15 DIAGNOSIS — R300 Dysuria: Secondary | ICD-10-CM | POA: Diagnosis not present

## 2020-08-15 DIAGNOSIS — J449 Chronic obstructive pulmonary disease, unspecified: Secondary | ICD-10-CM | POA: Diagnosis not present

## 2020-08-15 DIAGNOSIS — J4489 Other specified chronic obstructive pulmonary disease: Secondary | ICD-10-CM

## 2020-08-15 NOTE — Assessment & Plan Note (Signed)
GERD/chronic gastritis/atrophic gastritis  per biopsy, takes Protonix. F/u GI prn

## 2020-08-15 NOTE — Assessment & Plan Note (Signed)
blood pressure is controlled on Metoprolol 12.5mg  bid. Bun/creat 15/0.8 08/07/20

## 2020-08-15 NOTE — Assessment & Plan Note (Signed)
takes [employer]

## 2020-08-15 NOTE — Assessment & Plan Note (Signed)
Estrace vaginal cream for atrophic vaginitis.  

## 2020-08-15 NOTE — Assessment & Plan Note (Signed)
Hospitalized 02/17/20-02/21/20 for GI bleed/rectal bleed/diverticular bleed, placed on Protonix, EGD, f/u GI Dr. Carlean Purl. Hgb 11.6 08/07/20. Continue Vit B12, Iron.

## 2020-08-15 NOTE — Assessment & Plan Note (Signed)
Chronic lower back, leg pain, better, on Flexeril 2.5mg  qhs, Lyrica 75mg  qd, but c/o blurred vision since Lyrica was increased to 75mg  qhs.

## 2020-08-15 NOTE — Assessment & Plan Note (Signed)
Stable, takes MiraLax  

## 2020-08-15 NOTE — Progress Notes (Signed)
Location:   [facility] Room Number: [phone] Place of Service:  ALF [id number]) Provider:  Melyssa Signor X Jazzelle Zhang, NP  Nyeem Stoke X, NP  Patient Care Team: Ann Bohne X, NP as PCP - General (Internal Medicine) Devondre Guzzetta X, NP as Nurse Practitioner (Internal Medicine)  Extended Emergency Contact Information Primary Emergency Contact: Culbreth,Robin Address: [address] [place], [place] [address] [place] Phone: [phone] Mobile Phone: [phone] Relation: Daughter  Code Status:  FULL CODE Goals of care: Advanced Directive information Advanced Directives 08/15/2020  Does Patient Have a Medical Advance Directive? Yes  Type of Advance Directive Living will  Does patient want to make changes to medical advance directive? No - Patient declined  Copy of [place] in Chart? -  Would patient like information on creating a medical advance directive? -  Pre-existing out of facility DNR order (yellow form or pink MOST form) -     Chief Complaint  Patient presents with   Medical Management of Chronic Issues    Routine follow up   Health Maintenance    Discuss need for shingles vaccine, PNA vaccine, and td/tdap vaccine.     HPI:  Pt is a 87 y.o. female seen today for medical management of chronic diseases.      Hospitalized 02/17/20-02/21/20 for GI bleed/rectal bleed/diverticular bleed, placed on Protonix, EGD, f/u GI Dr. Carlean Purl. Hgb 11.6 08/07/20             Chronic lower back, leg pain, better, on Flexeril 2.5mg  qhs, Lyrica 75mg  qd, but c/o blurred vision since Lyrica was increased to 75mg  qhs.              HTN, blood pressure is controlled on Metoprolol 12.5mg  bid. Bun/creat 15/0.8 08/07/20             Urinary symptoms, on  Nitrofurantoin 50mg  qd for UTI suppression, Dr. Karsten Ro.              Estrace vaginal cream for atrophic vaginitis.              Anemia, dropped to 6.8 in hospital s/p 2 units of PRBC,  Vit B12, Iron Hgb  11.6 08/07/20             GERD/chronic gastritis/atrophic gastritis  per biopsy, takes Protonix. F/u GI prn             COPD, takes Breo Ellipta             Fibromyalgia takes Lyrica             Tactile hallucinations, off meds.             Constipation, takes MiraLax 2x/wk.   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     Fibromyalgia    GERD 12/19/2006   Qualifier: Diagnosis of  By: Julien Girt CMA, Marliss Czar  04/13/16 TSH 1.20, Na 141, K 4.2, Bun 15, creat 0.79, wbc 5.5, Hgb 11.5, plt 283    GERD (gastroesophageal reflux disease)    Hammer toe of second toe of left foot 04/08/2016   Left 2nd   Hearing loss    Hemorrhoids    Hypercholesterolemia    Hypertension    Osteoarthritis 12/19/2006   Qualifier: Diagnosis of  By: Julien Girt CMA, Leigh     Osteoporosis    Past Surgical History:  Procedure Laterality Date   BIOPSY  02/17/2020   Procedure: BIOPSY;  Surgeon: Jackquline Denmark, MD;  Location: [facility];  Service: Endoscopy;;   CATARACT EXTRACTION, BILATERAL  2009   COLONOSCOPY  2009, 2006 , 2017   ESOPHAGOGASTRODUODENOSCOPY (EGD) WITH PROPOFOL N/A 02/17/2020   Procedure: ESOPHAGOGASTRODUODENOSCOPY (EGD) WITH PROPOFOL;  Surgeon: Jackquline Denmark, MD;  Location: [facility];  Service: Endoscopy;  Laterality: N/A;   IR ANGIOGRAM SELECTIVE EACH ADDITIONAL VESSEL  02/17/2020   IR ANGIOGRAM SELECTIVE EACH ADDITIONAL  VESSEL  02/17/2020   IR ANGIOGRAM SELECTIVE EACH ADDITIONAL VESSEL  02/17/2020   IR ANGIOGRAM SELECTIVE EACH ADDITIONAL VESSEL  02/17/2020   IR ANGIOGRAM SELECTIVE EACH ADDITIONAL VESSEL  02/17/2020   IR ANGIOGRAM VISCERAL SELECTIVE  02/17/2020   IR ANGIOGRAM VISCERAL SELECTIVE  02/17/2020   IR US GUIDE VASC ACCESS RIGHT  02/17/2020   stapes surgery     Dr Thornell Mule   TONSILLECTOMY AND ADENOIDECTOMY     as a child    Allergies  Allergen Reactions   Penicillins Anaphylaxis   Bisphosphonates Other (See Comments)    [name]   Influenza Vaccines     Pt reported   Simvastatin     Unknown    Trazodone And Nefazodone     Felt her throat was closing up/kept her awake   Sulfonamide Derivatives Rash and Other (See Comments)    blisters    Allergies as of 08/15/2020       Reactions   Penicillins Anaphylaxis   Bisphosphonates Other (See Comments)   [name]   Influenza Vaccines    Pt reported   Simvastatin    Unknown    Trazodone And Nefazodone    Felt her throat was closing up/kept her awake   Sulfonamide Derivatives Rash, Other (See Comments)   blisters        Medication List        Accurate as of August 15, 2020  2:07 PM. If you have any questions, ask your nurse or doctor.          budesonide-formoterol 80-4.5 MCG/ACT inhaler Commonly known as: SYMBICORT Inhale 1 puff into the lungs daily.   calcium carbonate 750 MG chewable tablet Commonly known as: TUMS EX Chew 750 mg by mouth daily.   cholecalciferol 1000 units tablet Commonly known as: VITAMIN D Take 1,000 Units by mouth daily.   cyclobenzaprine 5 MG tablet Commonly known as: FLEXERIL Take 2.5 mg by mouth at bedtime.   docusate sodium 100 MG capsule Commonly known as: COLACE Take 100 mg by mouth 2 (two) times daily.   estradiol 0.1 MG/GM vaginal cream Commonly known as: ESTRACE Place 1 Applicatorful vaginally every Monday, Wednesday, and Friday.   ferrous sulfate 325 (65 FE) MG tablet Take 325 mg  by mouth. Once A Day on Mon, Wed,  Fri   hydrocortisone cream 1 % Apply 1 application topically 2 (two) times daily as needed (wrists).   metoprolol tartrate 25 MG tablet Commonly known as: LOPRESSOR Take 12.5 mg by mouth 2 (two) times daily.   nitrofurantoin 50 MG capsule Commonly known as: MACRODANTIN Take 50 mg by mouth at bedtime.   polyethylene glycol 17 g packet Commonly known as: MIRALAX / GLYCOLAX Take 17 g by mouth 2 (two) times a week. Sunday and Wednesday   pregabalin 50 MG capsule Commonly known as: LYRICA Take 1 capsule (50 mg total) by mouth daily.   vitamin B-12 1000 MCG tablet Commonly known as: CYANOCOBALAMIN Take 1,000 mcg by mouth daily.        Review of Systems  Constitutional:  Negative for fatigue, fever and unexpected weight change.  HENT:  Positive for hearing loss. Negative for congestion and voice change.   Eyes:  Negative for visual disturbance.       C/o blurred vision R>L  Respiratory:  Negative for cough, shortness of breath and wheezing.   Cardiovascular:  Positive for leg swelling.  Gastrointestinal:  Negative for abdominal pain and constipation.  Genitourinary:  Negative for dysuria and urgency.       Chronic, on and off  Musculoskeletal:  Positive for arthralgias, back pain, gait problem and myalgias.  Skin:  Negative for color change.  Neurological:  Negative for dizziness, facial asymmetry, speech difficulty, weakness, light-headedness and headaches.  Psychiatric/Behavioral:  Negative for behavioral problems, hallucinations and sleep disturbance.        Chronic going to bed late, sleeping in late   Immunization History  Administered Date(s) Administered   Moderna Sars-Covid-2 Vaccination 02/17/2019, 03/17/2019   Pneumococcal Conjugate-13 09/01/2015   Pneumococcal Polysaccharide-23 07/07/1998   Td 08/21/2009   Pertinent  Health Maintenance Due  Topic Date Due   PNA vac Low Risk Adult (2 of 2 - PPSV23) 08/31/2016   DEXA SCAN   Completed   INFLUENZA VACCINE  Discontinued   Fall Risk  11/08/2017 11/05/2016 09/01/2015 08/31/2012  Falls in the past year? No Yes Yes No  Number falls in past yr: - 2 or more 1 -  Injury with Fall? - Yes No -  Risk for fall due to : - - Impaired balance/gait -   Functional Status Survey:    Vitals:   08/15/20 1003  BP: 132/70  Pulse: 70  Resp: 18  Temp: 97.6 F (36.4 C)  SpO2: 97%  Weight: 132 lb 12.8 oz (60.2 kg)  Height: 5\' 1"  (1.549 m)   Body mass index is 25.09 kg/m. Physical Exam Vitals and nursing note reviewed.  Constitutional:      Appearance: Normal appearance.  HENT:     Head: Normocephalic and atraumatic.     Nose: Nose normal.     Mouth/Throat:     Mouth: Mucous membranes are moist.  Eyes:     Extraocular Movements: Extraocular movements intact.     Conjunctiva/sclera: Conjunctivae normal.     Pupils: Pupils are equal, round, and reactive to light.     Comments: Able to count my fingers 3 feet away from her eyes.   Cardiovascular:     Rate and Rhythm: Normal rate and regular rhythm.     Heart sounds: No murmur heard. Pulmonary:     Effort: Pulmonary effort is normal.     Breath sounds: No rales.     Comments: Decreased air entry to both lungs.  Abdominal:     General: Bowel  sounds are normal.     Palpations: Abdomen is soft.     Tenderness: There is no abdominal tenderness.  Musculoskeletal:     Cervical back: Normal range of motion and neck supple.     Right lower leg: Edema present.     Left lower leg: Edema present.     Comments: Trace edema BLE  Skin:    General: Skin is warm and dry.  Neurological:     General: No focal deficit present.     Mental Status: She is alert. Mental status is at baseline.     Motor: No weakness.     Coordination: Coordination normal.     Gait: Gait abnormal.     Comments: Oriented to person, place.   Psychiatric:        Mood and Affect: Mood normal.        Behavior: Behavior normal.    Labs  reviewed: Recent Labs    02/18/20 0115 02/19/20 0254 02/20/20 0909 02/28/20 0000 08/07/20 0000  NA 136 139 140 142 143  K 3.7 3.4* 3.4* 4.3 4.1  CL 108 108 108 107 107  CO2 21* 23 24 28* 27*  GLUCOSE 122* 125* 104*  --   --   BUN 16 19 15 13 15   CREATININE 0.58 0.65 0.71 0.9 0.8  CALCIUM 7.8* 8.5* 8.2* 9.1 8.9   Recent Labs    12/25/19 0000 02/16/20 0408 08/07/20 0000  AST 13 18 15   ALT 7 15 10   ALKPHOS 81 72 78  BILITOT  --  0.6  --   PROT  --  6.0*  --   ALBUMIN 3.6 3.8 3.6   Recent Labs    02/18/20 0115 02/19/20 0254 02/20/20 0909 02/28/20 0000 03/13/20 0000 08/07/20 0000  WBC 9.8 7.5 6.1 5.9 4.3 4.2  NEUTROABS  --   --   --  3,322.00 2,391.00 2,171.00  HGB 7.7* 6.8* 7.7* 8.6* 9.0* 11.6*  HCT 23.2* 20.7* 23.1* 26* 28* 35*  MCV 92.4 92.8 87.8  --   --   --   PLT 165 156 158 312 214 203   Lab Results  Component Value Date   TSH 1.92 01/10/2019   Lab Results  Component Value Date   HGBA1C 5.8 02/17/2017   Lab Results  Component Value Date   CHOL 177 01/10/2019   HDL 52 01/10/2019   LDLCALC 104 01/10/2019   LDLDIRECT 114.7 08/18/2011   TRIG 110 01/10/2019   CHOLHDL 2 01/23/2015    Significant Diagnostic Results in last 30 days:  No results found.  Assessment/Plan Chronic anemia Hospitalized 02/17/20-02/21/20 for GI bleed/rectal bleed/diverticular bleed, placed on Protonix, EGD, f/u GI Dr. Carlean Purl. Hgb 11.6 08/07/20. Continue Vit B12, Iron.   Peripheral neuropathy Chronic lower back, leg pain, better, on Flexeril 2.5mg  qhs, Lyrica 75mg  qd, but c/o blurred vision since Lyrica was increased to 75mg  qhs.  Hypertension blood pressure is controlled on Metoprolol 12.5mg  bid. Bun/creat 15/0.8 08/07/20  Dysuria Urinary symptoms, on  Nitrofurantoin 50mg  qd for UTI suppression, Dr. Karsten Ro.   Atrophic vaginitis Estrace vaginal cream for atrophic vaginitis.   GERD GERD/chronic gastritis/atrophic gastritis  per biopsy, takes Protonix. 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   Slow transit constipation Stable, takes MiraLax.   Blurred vision, bilateral More in the right eye since Lyrica increased to 75mg  qd,  no visual  field deficit, able to count my fingers 3 feet away from her eyes, no facial or limb weakness noted. The patient declined lyrica dose reduction. Will f/u Ophthalmology.      Family/ staff Communication: plan of care reviewed with the patient and charge nurse.   Labs/tests ordered:  none  Time spend 40 minutes.

## 2020-08-15 NOTE — Assessment & Plan Note (Signed)
More in the right eye since Lyrica increased to 75mg  qd,  no visual field deficit, able to count my fingers 3 feet away from her eyes, no facial or limb weakness noted. The patient declined lyrica dose reduction. Will f/u Ophthalmology.

## 2020-08-15 NOTE — Assessment & Plan Note (Signed)
Not complained, not on meds.

## 2020-08-15 NOTE — Assessment & Plan Note (Signed)
Urinary symptoms, on  Nitrofurantoin 50mg  qd for UTI suppression, Dr. Karsten Ro.

## 2020-08-15 NOTE — Assessment & Plan Note (Signed)
takes Lyrica 

## 2020-09-09 ENCOUNTER — Other Ambulatory Visit: Payer: Self-pay | Admitting: *Deleted

## 2020-09-09 MED ORDER — PREGABALIN 75 MG PO CAPS: 75.0000 mg | ORAL_CAPSULE | Freq: Every day | ORAL | 3 refills | Status: DC

## 2020-09-09 NOTE — Telephone Encounter (Signed)
[facility] faxed order for Lyrica '75mg'$  once daily.  Lyrica changed on 08/15/2020 by [facility].  Medication updated in chart and sent to [facility] for approval.

## 2020-09-17 DIAGNOSIS — L821 Other seborrheic keratosis: Secondary | ICD-10-CM | POA: Diagnosis not present

## 2020-09-17 DIAGNOSIS — L570 Actinic keratosis: Secondary | ICD-10-CM | POA: Diagnosis not present

## 2020-09-17 DIAGNOSIS — D1801 Hemangioma of skin and subcutaneous tissue: Secondary | ICD-10-CM | POA: Diagnosis not present

## 2020-09-17 DIAGNOSIS — Z85828 Personal history of other malignant neoplasm of skin: Secondary | ICD-10-CM | POA: Diagnosis not present

## 2020-11-07 ENCOUNTER — Other Ambulatory Visit: Payer: Self-pay | Admitting: Orthopedic Surgery

## 2020-11-07 DIAGNOSIS — M159 Polyosteoarthritis, unspecified: Secondary | ICD-10-CM

## 2020-11-07 DIAGNOSIS — G629 Polyneuropathy, unspecified: Secondary | ICD-10-CM

## 2020-11-07 MED ORDER — PREGABALIN 75 MG PO CAPS: 75.0000 mg | ORAL_CAPSULE | Freq: Every day | ORAL | 3 refills | Status: DC

## 2020-12-05 ENCOUNTER — Other Ambulatory Visit: Payer: Self-pay | Admitting: Nurse Practitioner

## 2020-12-05 DIAGNOSIS — G629 Polyneuropathy, unspecified: Secondary | ICD-10-CM

## 2020-12-05 MED ORDER — PREGABALIN 75 MG PO CAPS: 75.0000 mg | ORAL_CAPSULE | Freq: Three times a day (TID) | ORAL | 2 refills | Status: DC

## 2020-12-05 NOTE — Progress Notes (Signed)
sent 

## 2020-12-16 DIAGNOSIS — H26493 Other secondary cataract, bilateral: Secondary | ICD-10-CM | POA: Diagnosis not present

## 2020-12-16 DIAGNOSIS — H47093 Other disorders of optic nerve, not elsewhere classified, bilateral: Secondary | ICD-10-CM | POA: Diagnosis not present

## 2020-12-16 DIAGNOSIS — H04123 Dry eye syndrome of bilateral lacrimal glands: Secondary | ICD-10-CM | POA: Diagnosis not present

## 2020-12-16 DIAGNOSIS — H3589 Other specified retinal disorders: Secondary | ICD-10-CM | POA: Diagnosis not present

## 2020-12-16 DIAGNOSIS — Z961 Presence of intraocular lens: Secondary | ICD-10-CM | POA: Diagnosis not present

## 2020-12-16 DIAGNOSIS — H1013 Acute atopic conjunctivitis, bilateral: Secondary | ICD-10-CM | POA: Diagnosis not present

## 2020-12-16 DIAGNOSIS — H35443 Age-related reticular degeneration of retina, bilateral: Secondary | ICD-10-CM | POA: Diagnosis not present

## 2021-01-06 DIAGNOSIS — Z85828 Personal history of other malignant neoplasm of skin: Secondary | ICD-10-CM | POA: Diagnosis not present

## 2021-01-06 DIAGNOSIS — L570 Actinic keratosis: Secondary | ICD-10-CM | POA: Diagnosis not present

## 2021-01-06 DIAGNOSIS — L218 Other seborrheic dermatitis: Secondary | ICD-10-CM | POA: Diagnosis not present

## 2021-02-03 ENCOUNTER — Non-Acute Institutional Stay: Payer: Medicare Other | Admitting: Internal Medicine

## 2021-02-03 ENCOUNTER — Encounter: Payer: Self-pay | Admitting: Internal Medicine

## 2021-02-03 DIAGNOSIS — G629 Polyneuropathy, unspecified: Secondary | ICD-10-CM

## 2021-02-03 DIAGNOSIS — J449 Chronic obstructive pulmonary disease, unspecified: Secondary | ICD-10-CM

## 2021-02-03 DIAGNOSIS — R442 Other hallucinations: Secondary | ICD-10-CM | POA: Diagnosis not present

## 2021-02-03 DIAGNOSIS — D649 Anemia, unspecified: Secondary | ICD-10-CM

## 2021-02-03 DIAGNOSIS — I10 Essential (primary) hypertension: Secondary | ICD-10-CM | POA: Diagnosis not present

## 2021-02-03 DIAGNOSIS — M159 Polyosteoarthritis, unspecified: Secondary | ICD-10-CM

## 2021-02-03 NOTE — Progress Notes (Signed)
Location:   [facility] Room Number: 409 Place of Service:  ALF [phone]) Provider:  Veleta Miners MD   Mast, Man X, NP  Patient Care Team: Mast, Man X, NP as PCP - General (Internal Medicine) Mast, Man X, NP as Nurse Practitioner (Internal Medicine)  Extended Emergency Contact Information Primary Emergency Contact: Culbreth,Robin Address: [address] [place],  [place] [address] [place] Phone: [phone] Mobile Phone: [phone] Relation: Daughter  Code Status:  Full Code Goals of care: Advanced Directive information Advanced Directives 08/15/2020  Does Patient Have a Medical Advance Directive? Yes  Type of Advance Directive Living will  Does patient want to make changes to medical advance directive? No - Patient declined  Copy of [place] in Chart? -  Would patient like information on creating a medical advance directive? -  Pre-existing out of facility DNR order (yellow form or pink MOST form) -     Chief Complaint  Patient presents with   Medical Management of Chronic Issues   Quality Metric Gaps    Zoster Vaccines- Shingrix (1 of 2)    Pneumonia Vaccine 17+ Years old (3 - PPSV23 if available, else PCV20) COVID-19 Vaccine (3 - Booster for Moderna series)  TETANUS/TDAP     HPI:  Pt is a 87 y.o. female seen today for medical management of chronic diseases.    Patient is long term resident of AL unit in [place]. Patient has h/o fibromyalgia on Lyrica  hypertension, hyperlipidemia  anemia, osteoporosis, COPD , Insomnia and Tactile hallucinations but patient refuses to take any Meds for it Chronic nonsteroidal use  Continuous to stay Stable Walks with her walker Has some chronic Issues but does not want meds fo them  [place] today as her daughter has been diagnosed with Cancer and getting Chemo No Sob or weakness or falls Wt Readings from Last 3 Encounters:  02/03/21 145 lb  3.2 oz (65.9 kg)  08/15/20 132 lb 12.8 oz (60.2 kg)  08/06/20 132 lb 12.8 oz (60.2 kg)     Past Medical History:  Diagnosis Date   Acute blood loss anemia 08/23/2013   04/13/16 TSH 1.20, Na 141, K 4.2, Bun 15, creat 0.79, wbc 5.5, Hgb 11.5, plt 283   Anxiety    Anxiety state 07/02/2007   Qualifier: Diagnosis of  By: Lenna Gilford MD, Deborra Medina    Asthma    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     Fibromyalgia    GERD 12/19/2006   Qualifier: Diagnosis of  By: Julien Girt CMA, Marliss Czar  04/13/16 TSH 1.20, Na 141, K 4.2, Bun 15, creat 0.79, wbc 5.5, Hgb 11.5, plt 283    GERD (gastroesophageal reflux disease)    Hammer toe of second toe of left foot 04/08/2016   Left 2nd   Hearing loss    Hemorrhoids    Hypercholesterolemia    Hypertension    Osteoarthritis 12/19/2006   Qualifier: Diagnosis of  By: Julien Girt CMA, Leigh     Osteoporosis    Past  Surgical History:  Procedure Laterality Date   BIOPSY  02/17/2020   Procedure: BIOPSY;  Surgeon: Jackquline Denmark, MD;  Location: [facility];  Service: Endoscopy;;   CATARACT EXTRACTION, BILATERAL  2009   COLONOSCOPY  2009, 2006 , 2017   ESOPHAGOGASTRODUODENOSCOPY (EGD) WITH PROPOFOL N/A 02/17/2020   Procedure: ESOPHAGOGASTRODUODENOSCOPY (EGD) WITH PROPOFOL;  Surgeon: Jackquline Denmark, MD;  Location: [facility];  Service: Endoscopy;  Laterality: N/A;   IR ANGIOGRAM SELECTIVE EACH ADDITIONAL VESSEL  02/17/2020   IR ANGIOGRAM SELECTIVE EACH ADDITIONAL VESSEL  02/17/2020   IR ANGIOGRAM SELECTIVE EACH ADDITIONAL VESSEL  02/17/2020   IR ANGIOGRAM SELECTIVE EACH ADDITIONAL VESSEL  02/17/2020   IR ANGIOGRAM SELECTIVE EACH ADDITIONAL VESSEL   02/17/2020   IR ANGIOGRAM VISCERAL SELECTIVE  02/17/2020   IR ANGIOGRAM VISCERAL SELECTIVE  02/17/2020   IR US GUIDE VASC ACCESS RIGHT  02/17/2020   stapes surgery     Dr Thornell Mule   TONSILLECTOMY AND ADENOIDECTOMY     as a child    Allergies  Allergen Reactions   Penicillins Anaphylaxis   Bisphosphonates Other (See Comments)    [name]   Influenza Vaccines     Pt reported   Simvastatin     Unknown    Trazodone And Nefazodone     Felt her throat was closing up/kept her awake   Sulfonamide Derivatives Rash and Other (See Comments)    blisters    Allergies as of 02/03/2021       Reactions   Penicillins Anaphylaxis   Bisphosphonates Other (See Comments)   [name]   Influenza Vaccines    Pt reported   Simvastatin    Unknown    Trazodone And Nefazodone    Felt her throat was closing up/kept her awake   Sulfonamide Derivatives Rash, Other (See Comments)   blisters        Medication List        Accurate as of February 03, 2021  2:22 PM. If you have any questions, ask your nurse or doctor.          STOP taking these medications    estradiol 0.1 MG/GM vaginal cream Commonly known as: ESTRACE Stopped by: Virgie Dad, MD       TAKE these medications    budesonide-formoterol 80-4.5 MCG/ACT inhaler Commonly known as: SYMBICORT Inhale 1 puff into the lungs daily.   calcium carbonate 750 MG chewable tablet Commonly known as: TUMS EX Chew 750 mg by mouth daily.   cholecalciferol 1000 units tablet Commonly known as: VITAMIN D Take 1,000 Units by mouth daily.   cyclobenzaprine 5 MG tablet Commonly known as: FLEXERIL Take 2.5 mg by mouth at bedtime.   docusate sodium 100 MG capsule Commonly known as: COLACE Take 100 mg by mouth 2 (two) times daily.   ferrous sulfate 325 (65 FE) MG tablet Take 325 mg by mouth. Once A Day on Mon, Wed, Fri   hydrocortisone cream 1 % Apply 1 application topically 2 (two) times daily as needed (wrists).    ketoconazole 2 % cream Commonly known as: NIZORAL Apply 1 application topically 2 (two) times daily as needed for irritation.   metoprolol tartrate 25 MG tablet Commonly known as: LOPRESSOR Take 12.5 mg by mouth 2 (two) times daily.   nitrofurantoin 50 MG capsule Commonly known as: MACRODANTIN Take 50 mg by mouth at bedtime.   Olopatadine HCl 0.2 % Soln Apply 1 drop to eye daily.   polyethylene glycol 17 g packet  Commonly known as: MIRALAX / GLYCOLAX Take 17 g by mouth 2 (two) times a week. Sunday and Wednesday   pregabalin 75 MG capsule Commonly known as: LYRICA Take 1 capsule (75 mg total) by mouth at bedtime. What changed: Another medication with the same name was removed. Continue taking this medication, and follow the directions you see here. Changed by: Virgie Dad, MD   Propylene Glycol 0.6 % Soln Apply 1 drop to eye 4 (four) times daily.   vitamin B-12 1000 MCG tablet Commonly known as: CYANOCOBALAMIN Take 1,000 mcg by mouth daily.        Review of Systems  Constitutional:  Negative for activity change and appetite change.  HENT: Negative.    Respiratory:  Negative for cough and shortness of breath.   Cardiovascular:  Negative for leg swelling.  Gastrointestinal:  Negative for constipation.  Genitourinary: Negative.   Musculoskeletal:  Positive for arthralgias. Negative for gait problem and myalgias.  Skin: Negative.   Neurological:  Negative for dizziness and weakness.  Psychiatric/Behavioral:  Positive for dysphoric mood and sleep disturbance. Negative for confusion.    Immunization History  Administered Date(s) Administered   Moderna SARS-COV2 Booster Vaccination 12/25/2019   Moderna Sars-Covid-2 Vaccination 02/17/2019, 03/17/2019   Pneumococcal Conjugate-13 09/01/2015   Pneumococcal Polysaccharide-23 07/07/1998   Td 08/21/2009   Pertinent  Health Maintenance Due  Topic Date Due   DEXA SCAN  Completed   INFLUENZA VACCINE  Discontinued    Fall Risk 02/19/2020 02/19/2020 02/20/2020 02/20/2020 02/21/2020  Falls in the past year? - - - - -  Was there an injury with Fall? - - - - -  Patient Fall Risk Level High fall risk High fall risk High fall risk Moderate fall risk Moderate fall risk  Patient at Risk for Falls Due to - - - - -   Functional Status Survey:    Vitals:   02/03/21 1207  BP: 130/66  Pulse: 92  Resp: 16  Temp: 98 F (36.7 C)  SpO2: 94%  Weight: 145 lb 3.2 oz (65.9 kg)  Height: 5\' 1"  (1.549 m)   Body mass index is 27.44 kg/m. Physical Exam Vitals reviewed.  Constitutional:      Appearance: Normal appearance.  HENT:     Head: Normocephalic.     Nose: Nose normal.     Mouth/Throat:     Mouth: Mucous membranes are moist.     Pharynx: Oropharynx is clear.  Eyes:     Pupils: Pupils are equal, round, and reactive to light.  Cardiovascular:     Rate and Rhythm: Normal rate and regular rhythm.     Pulses: Normal pulses.     Heart sounds: Normal heart sounds. No murmur heard. Pulmonary:     Effort: Pulmonary effort is normal.     Breath sounds: Normal breath sounds.  Abdominal:     General: Abdomen is flat. Bowel sounds are normal.     Palpations: Abdomen is soft.  Musculoskeletal:        General: No swelling.     Cervical back: Neck supple.  Skin:    General: Skin is warm.  Neurological:     General: No focal deficit present.     Mental Status: She is alert and oriented to person, place, and time.  Psychiatric:        Mood and Affect: Mood normal.        Thought Content: Thought content normal.    Labs reviewed: Recent Labs  02/18/20 0115 02/19/20 0254 02/20/20 0909 02/28/20 0000 08/07/20 0000  NA 136 139 140 142 143  K 3.7 3.4* 3.4* 4.3 4.1  CL 108 108 108 107 107  CO2 21* 23 24 28* 27*  GLUCOSE 122* 125* 104*  --   --   BUN 16 19 15 13 15   CREATININE 0.58 0.65 0.71 0.9 0.8  CALCIUM 7.8* 8.5* 8.2* 9.1 8.9   Recent Labs    02/16/20 0408 08/07/20 0000  AST 18 15  ALT 15 10   ALKPHOS 72 78  BILITOT 0.6  --   PROT 6.0*  --   ALBUMIN 3.8 3.6   Recent Labs    02/18/20 0115 02/19/20 0254 02/20/20 0909 02/28/20 0000 03/13/20 0000 08/07/20 0000  WBC 9.8 7.5 6.1 5.9 4.3 4.2  NEUTROABS  --   --   --  3,322.00 2,391.00 2,171.00  HGB 7.7* 6.8* 7.7* 8.6* 9.0* 11.6*  HCT 23.2* 20.7* 23.1* 26* 28* 35*  MCV 92.4 92.8 87.8  --   --   --   PLT 165 156 158 312 214 203   Lab Results  Component Value Date   TSH 1.92 01/10/2019   Lab Results  Component Value Date   HGBA1C 5.8 02/17/2017   Lab Results  Component Value Date   CHOL 177 01/10/2019   HDL 52 01/10/2019   LDLCALC 104 01/10/2019   LDLDIRECT 114.7 08/18/2011   TRIG 110 01/10/2019   CHOLHDL 2 01/23/2015    Significant Diagnostic Results in last 30 days:  No results found.  Assessment/Plan 1. COPD (chronic obstructive pulmonary disease) with chronic bronchitis (HCC) On Symbicort  2. Osteoarthritis of multiple joints, unspecified osteoarthritis type On Flexeril No NSAIDS due to Gastritis  3. Primary hypertension Low Dose of Lopressor  4. Chronic anemia On Iron Repeat CBC  5. Tactile hallucination Was on Risperdal before Not anymore   6 Peripheral polyneuropathy Continue Lyrica 7 Recurent UTI On Macrodantin   Family/ staff Communication:   Labs/tests ordered:  CBC,CMP

## 2021-02-04 ENCOUNTER — Other Ambulatory Visit: Payer: Self-pay | Admitting: Orthopedic Surgery

## 2021-02-04 DIAGNOSIS — M159 Polyosteoarthritis, unspecified: Secondary | ICD-10-CM

## 2021-02-04 DIAGNOSIS — G629 Polyneuropathy, unspecified: Secondary | ICD-10-CM

## 2021-02-04 MED ORDER — PREGABALIN 75 MG PO CAPS: 75.0000 mg | ORAL_CAPSULE | Freq: Every day | ORAL | 3 refills | Status: DC

## 2021-02-05 DIAGNOSIS — D649 Anemia, unspecified: Secondary | ICD-10-CM | POA: Diagnosis not present

## 2021-02-05 DIAGNOSIS — I10 Essential (primary) hypertension: Secondary | ICD-10-CM | POA: Diagnosis not present

## 2021-02-05 LAB — COMPREHENSIVE METABOLIC PANEL
Albumin: 3.6 (ref 3.5–5.0)
Calcium: 8.7 (ref 8.7–10.7)
Globulin: 1.9

## 2021-02-05 LAB — CBC AND DIFFERENTIAL
HCT: 35 — AB (ref 36–46)
Hemoglobin: 11.7 — AB (ref 12.0–16.0)
Neutrophils Absolute: 2636
Platelets: 187 (ref 150–399)
WBC: 4.6

## 2021-02-05 LAB — HEPATIC FUNCTION PANEL
ALT: 9 (ref 7–35)
AST: 12 — AB (ref 13–35)
Alkaline Phosphatase: 69 (ref 25–125)
Bilirubin, Total: 0.5

## 2021-02-05 LAB — BASIC METABOLIC PANEL WITH GFR
BUN: 15 (ref 4–21)
CO2: 27 — AB (ref 13–22)
Chloride: 108 (ref 99–108)
Creatinine: 0.8 (ref 0.5–1.1)
Glucose: 86
Potassium: 4 (ref 3.4–5.3)
Sodium: 141 (ref 137–147)

## 2021-02-05 LAB — CBC: RBC: 3.84 — AB (ref 3.87–5.11)

## 2021-02-06 ENCOUNTER — Encounter: Payer: Self-pay | Admitting: Nurse Practitioner

## 2021-02-06 ENCOUNTER — Non-Acute Institutional Stay (INDEPENDENT_AMBULATORY_CARE_PROVIDER_SITE_OTHER): Payer: Medicare Other | Admitting: Nurse Practitioner

## 2021-02-06 DIAGNOSIS — Z0000 Encounter for general adult medical examination without abnormal findings: Secondary | ICD-10-CM | POA: Diagnosis not present

## 2021-02-10 ENCOUNTER — Encounter: Payer: Self-pay | Admitting: Nurse Practitioner

## 2021-02-10 NOTE — Progress Notes (Addendum)
Subjective:   Lisa King is a 87 y.o. female who presents for Medicare Annual (Subsequent) preventive examination at [facility].     Objective:    Today's Vitals   02/06/21 1428  BP: 124/76  Pulse: 82  Resp: 18  Temp: (!) 96.6 F (35.9 C)  SpO2: 96%  Weight: 145 lb 3.2 oz (65.9 kg)  Height: 5\' 1"  (1.549 m)   Body mass index is 27.44 kg/m.  Advanced Directives 08/15/2020 08/06/2020 02/16/2020 10/15/2019 04/10/2019 03/21/2019 12/06/2018  Does Patient Have a Medical Advance Directive? Yes Yes Yes Yes Yes Yes Yes  Type of Advance Directive Living will Living will [place];Living will Living will Living will Living will Living will  Does patient want to make changes to medical advance directive? No - Patient declined No - Patient declined No - Patient declined No - Patient declined No - Patient declined No - Patient declined No - Patient declined  Copy of [place] in Chart? - - No - copy requested - - - -  Would patient like information on creating a medical advance directive? - - - - - - -  Pre-existing out of facility DNR order (yellow form or pink MOST form) - - - - - - -    Current Medications (verified) Outpatient Encounter Medications as of 02/06/2021  Medication Sig   budesonide-formoterol (SYMBICORT) 80-4.5 MCG/ACT inhaler Inhale 1 puff into the lungs daily.   calcium carbonate (TUMS EX) 750 MG chewable tablet Chew 750 mg by mouth daily.    cholecalciferol (VITAMIN D) 1000 UNITS tablet Take 1,000 Units by mouth daily.   cyclobenzaprine (FLEXERIL) 5 MG tablet Take 2.5 mg by mouth at bedtime.   docusate sodium (COLACE) 100 MG capsule Take 100 mg by mouth 2 (two) times daily.   ferrous sulfate 325 (65 FE) MG tablet Take 325 mg by mouth. Once A Day on Mon, Wed, Fri   hydrocortisone 2.5 % cream Apply topically 2 (two) times daily as needed.   hydrocortisone cream 1 % Apply 1 application topically 2 (two) times daily  as needed (wrists).   ketoconazole (NIZORAL) 2 % cream Apply 1 application topically 2 (two) times daily as needed for irritation.   metoprolol tartrate (LOPRESSOR) 25 MG tablet Take 12.5 mg by mouth 2 (two) times daily.   nitrofurantoin (MACRODANTIN) 50 MG capsule Take 50 mg by mouth at bedtime.   Olopatadine HCl 0.2 % SOLN Apply 1 drop to eye daily.   polyethylene glycol (MIRALAX / GLYCOLAX) 17 g packet Take 17 g by mouth 2 (two) times a week. Sunday and Wednesday   pregabalin (LYRICA) 75 MG capsule Take 1 capsule (75 mg total) by mouth at bedtime.   Propylene Glycol 0.6 % SOLN Apply 1 drop to eye 4 (four) times daily.   vitamin B-12 (CYANOCOBALAMIN) 1000 MCG tablet Take 1,000 mcg by mouth daily.   No facility-administered encounter medications on file as of 02/06/2021.    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     Fibromyalgia    GERD 12/19/2006   Qualifier: Diagnosis of  By: Julien Girt CMA, Marliss Czar  04/13/16 TSH 1.20, Na 141, K 4.2, Bun 15, creat 0.79, wbc 5.5, Hgb 11.5, plt 283    GERD (gastroesophageal reflux disease)    Hammer toe of second toe of left foot 04/08/2016   Left 2nd   Hearing loss    Hemorrhoids    Hypercholesterolemia     Hypertension    Osteoarthritis 12/19/2006   Qualifier: Diagnosis of  By: Julien Girt CMA, Leigh     Osteoporosis    Past Surgical History:  Procedure Laterality Date   BIOPSY  02/17/2020   Procedure: BIOPSY;  Surgeon: Jackquline Denmark, MD;  Location: [facility];  Service: Endoscopy;;   CATARACT EXTRACTION, BILATERAL  2009   COLONOSCOPY  2009, 2006 , 2017   ESOPHAGOGASTRODUODENOSCOPY (EGD) WITH PROPOFOL N/A 02/17/2020   Procedure: ESOPHAGOGASTRODUODENOSCOPY (EGD) WITH PROPOFOL;  Surgeon: Jackquline Denmark, MD;  Location: [facility];  Service: Endoscopy;  Laterality: N/A;   IR ANGIOGRAM SELECTIVE EACH ADDITIONAL VESSEL  02/17/2020   IR ANGIOGRAM SELECTIVE EACH ADDITIONAL VESSEL  02/17/2020   IR ANGIOGRAM SELECTIVE EACH ADDITIONAL VESSEL  02/17/2020   IR ANGIOGRAM SELECTIVE EACH ADDITIONAL VESSEL  02/17/2020   IR ANGIOGRAM SELECTIVE EACH ADDITIONAL VESSEL  02/17/2020   IR ANGIOGRAM VISCERAL SELECTIVE  02/17/2020   IR ANGIOGRAM VISCERAL SELECTIVE  02/17/2020   IR US GUIDE VASC ACCESS RIGHT  02/17/2020   stapes surgery     Dr Thornell Mule   TONSILLECTOMY AND ADENOIDECTOMY     as a child   Family History  Problem Relation Age of Onset   Heart disease Father    COPD Father    Hypertension Mother    Arthritis Mother    Social History   Socioeconomic History   Marital status: Widowed    Spouse name: Not on file   Number of children: Not on file   Years of education: Not on file   Highest education level: Not on file  Occupational History   Occupation: retired   Occupation: works some weekends at [employer] Use   Smoking status: Former    Types: Cigarettes    Quit date: 02/15/2010    Years since quitting: 11.0   Smokeless tobacco: Never  Substance and Sexual Activity   Alcohol use: Yes    Comment: occasional glass of wine   Drug use: No   Sexual activity: Never  Other Topics Concern   Not on file  Social History Narrative   Pt works some weekends at [employer]   Caffeine use  - daily coffee in the mornings   Patient gets regular exercise > tries to walk daily for exercise   Moved to [place] 03/11/16   Widowed   Former Smoker-stopped 2012   Alcohol occasionally glass of wine   Living Will   Social Determinants of Radio broadcast assistant Strain: Not on file  Food Insecurity: Not on file  Transportation Needs: Not on file  Physical Activity: Not on file  Stress: Not on file  Social Connections: Not  on file    Tobacco Counseling Counseling given: Not Answered   Clinical Intake:  Pre-visit preparation completed: Yes  Pain : No/denies pain     BMI - recorded: 27.44 Nutritional Status: BMI 25 -29 Overweight Nutritional Risks: None Diabetes: No  How often do you need to have someone help you when you read instructions, pamphlets, or other written materials from your doctor or pharmacy?: 1 - Never What is the last grade level you completed in school?: college  Diabetic?no  Interpreter Needed?: No  Information entered by :: Slayden Mennenga Bretta Bang NP   Activities of Daily Living In your present state of health, do you have any difficulty performing the following activities: 02/10/2021 02/16/2020  Hearing? N Y  Vision? N Y  Difficulty concentrating or making decisions? Tempie Donning  Walking or climbing stairs? Y Y  Dressing or bathing? Y N  Doing errands, shopping? Tempie Donning  Preparing Food and eating ? N -  Using the Toilet? N -  In the past six months, have you accidently leaked urine? N -  Do you have problems with loss of bowel control? N -  Managing your Medications? Y -  Managing your Finances? Y -  Housekeeping or managing your Housekeeping? Y -  Some recent data might be hidden    Patient Care Team: Adena Sima X, NP as PCP - General (Internal Medicine) Satya Buttram X, NP as Nurse Practitioner (Internal Medicine)  Indicate any recent Medical Services you may have received from other than [facility] providers in the past year (date may be  approximate).     Assessment:   This is a routine wellness examination for [place].  Hearing/Vision screen No results found.  Dietary issues and exercise activities discussed: Current Exercise Habits: The patient does not participate in regular exercise at present, Exercise limited by: neurologic condition(s);orthopedic condition(s);psychological condition(s)   Goals Addressed             This Visit's Progress    Maintain Mobility and Function       Evidence-based guidance:  Emphasize the importance of physical activity and aerobic exercise as included in treatment plan; assess barriers to adherence; consider patient's abilities and preferences.  Encourage gradual increase in activity or exercise instead of stopping if pain occurs.  Reinforce individual therapy exercise prescription, such as strengthening, stabilization and stretching programs.  Promote optimal body mechanics to stabilize the spine with lifting and functional activity.  Encourage activity and mobility modifications to facilitate optimal function, such as using a log roll for bed mobility or dressing from a seated position.  Reinforce individual adaptive equipment recommendations to limit excessive spinal movements, such as a Systems analyst.  Assess adequacy of sleep; encourage use of sleep hygiene techniques, such as bedtime routine; use of white noise; dark, cool bedroom; avoiding daytime naps, heavy meals or exercise before bedtime.  Promote positions and modification to optimize sleep and sexual activity; consider pillows or positioning devices to assist in maintaining neutral spine.  Explore options for applying ergonomic principles at work and home, such as frequent position changes, using ergonomically designed equipment and working at optimal height.  Promote modifications to increase comfort with driving such as lumbar support, optimizing seat and steering wheel position, using cruise control and taking  frequent rest stops to stretch and walk.   Notes:        Depression Screen PHQ 2/9 Scores 11/08/2017 11/05/2016 09/01/2015 08/31/2012  PHQ - 2 Score 0 0 0 0    Fall  Risk Fall Risk  11/08/2017 11/05/2016 09/01/2015 08/31/2012  Falls in the past year? No Yes Yes No  Number falls in past yr: - 2 or more 1 -  Injury with Fall? - Yes No -  Risk for fall due to : - - Impaired balance/gait -    FALL RISK PREVENTION PERTAINING TO THE HOME:  Any stairs in or around the home? Yes  If so, are there any without handrails? No  Home free of loose throw rugs in walkways, pet beds, electrical cords, etc? Yes  Adequate lighting in your home to reduce risk of falls? Yes   ASSISTIVE DEVICES UTILIZED TO PREVENT FALLS:  Life alert? No  Use of a cane, walker or w/c? Yes  Grab bars in the bathroom? Yes  Shower chair or bench in shower? Yes  Elevated toilet seat or a handicapped toilet? Yes   TIMED UP AND GO:  Was the test performed? Yes .  Length of time to ambulate 10 feet: 15 sec.   Gait slow and steady with assistive device  Cognitive Function: MMSE - Mini Mental State Exam 12/29/2017 11/08/2017 11/05/2016 05/12/2016  Orientation to time 5 5 5 4   Orientation to Place 5 5 5 5   Registration 3 3 3 3   Attention/ Calculation 5 5 5 5   Recall 0 1 2 1   Language- name 2 objects 2 2 2 2   Language- repeat 1 1 1 1   Language- follow 3 step command 3 3 3 3   Language- read & follow direction 1 1 1 1   Write a sentence 1 1 1 1   Copy design 1 1 1 1   Total score 27 28 29 27         Immunizations Immunization History  Administered Date(s) Administered   Moderna SARS-COV2 Booster Vaccination 12/25/2019   Moderna Sars-Covid-2 Vaccination 02/17/2019, 03/17/2019   Pneumococcal Conjugate-13 09/01/2015   Pneumococcal Polysaccharide-23 07/07/1998   Td 08/21/2009    TDAP status: Up to date  Flu Vaccine status: Declined, Education has been provided regarding the importance of this vaccine but patient still  declined. Advised may receive this vaccine at local pharmacy or Health Dept. Aware to provide a copy of the vaccination record if obtained from local pharmacy or Health Dept. Verbalized acceptance and understanding.  Pneumococcal vaccine status: Up to date  Covid-19 vaccine status: Completed vaccines  Qualifies for Shingles Vaccine? Yes   Zostavax completed No   Shingrix Completed?: No.    Education has been provided regarding the importance of this vaccine. Patient has been advised to call insurance company to determine out of pocket expense if they have not yet received this vaccine. Advised may also receive vaccine at local pharmacy or Health Dept. Verbalized acceptance and understanding.  Screening Tests Health Maintenance  Topic Date Due   Zoster Vaccines- Shingrix (1 of 2) Never done   Pneumonia Vaccine 23+ Years old (3 - PPSV23 if available, else PCV20) 08/31/2016   TETANUS/TDAP  08/22/2019   COVID-19 Vaccine (3 - Booster for Moderna series) 02/19/2020   DEXA SCAN  Completed   HPV VACCINES  Aged Out   INFLUENZA VACCINE  Discontinued    Health Maintenance  Health Maintenance Due  Topic Date Due   Zoster Vaccines- Shingrix (1 of 2) Never done   Pneumonia Vaccine 73+ Years old (74 - PPSV23 if available, else PCV20) 08/31/2016   TETANUS/TDAP  08/22/2019   COVID-19 Vaccine (3 - Booster for Moderna series) 02/19/2020    Colorectal cancer screening: No longer  required.   Mammogram status: No longer required due to aged out.  Bone Density status: Ordered DEXA. Pt provided with contact info and advised to call to schedule appt.  Lung Cancer Screening: (Low Dose CT Chest recommended if Age 72-80 years, 30 pack-year currently smoking OR have quit w/in 15years.) does not qualify.   Additional Screening:  Hepatitis C Screening: does not qualify  Vision Screening: Recommended annual ophthalmology exams for early detection of glaucoma and other disorders of the eye. Is the patient  up to date with their annual eye exam?  No  Who is the provider or what is the name of the office in which the patient attends annual eye exams? [facility] If pt is not established with a provider, would they like to be referred to a provider to establish care? No .   Dental Screening: Recommended annual dental exams for proper oral hygiene  Community Resource Referral / Chronic Care Management: CRR required this visit?  No   CCM required this visit?  No      Plan:     I have personally reviewed and noted the following in the patients chart:   Medical and social history Use of alcohol, tobacco or illicit drugs  Current medications and supplements including opioid prescriptions.  Functional ability and status Nutritional status Physical activity Advanced directives List of other physicians Hospitalizations, surgeries, and ER visits in previous 12 months Vitals Screenings to include cognitive, depression, and falls Referrals and appointments  In addition, I have reviewed and discussed with patient certain preventive protocols, quality metrics, and best practice recommendations. A written personalized care plan for preventive services as well as general preventive health recommendations were provided to patient.   DEXA MMSE Shingrix f/u Ophthalmology order provided.   Bergen Magner X Seleena Reimers, NP   02/12/2021

## 2021-03-11 DIAGNOSIS — Z85828 Personal history of other malignant neoplasm of skin: Secondary | ICD-10-CM | POA: Diagnosis not present

## 2021-03-11 DIAGNOSIS — L570 Actinic keratosis: Secondary | ICD-10-CM | POA: Diagnosis not present

## 2021-03-11 DIAGNOSIS — L821 Other seborrheic keratosis: Secondary | ICD-10-CM | POA: Diagnosis not present

## 2021-03-11 DIAGNOSIS — L814 Other melanin hyperpigmentation: Secondary | ICD-10-CM | POA: Diagnosis not present

## 2021-03-18 ENCOUNTER — Encounter (HOSPITAL_COMMUNITY): Payer: Self-pay | Admitting: Emergency Medicine

## 2021-03-18 ENCOUNTER — Emergency Department (HOSPITAL_COMMUNITY): Payer: Medicare Other

## 2021-03-18 ENCOUNTER — Observation Stay (HOSPITAL_COMMUNITY)
Admit: 2021-03-18 | Discharge: 2021-03-18 | Disposition: A | Discharge status: Home / Self Care | Payer: Medicare Other | Attending: Internal Medicine | Admitting: Internal Medicine

## 2021-03-18 ENCOUNTER — Inpatient Hospital Stay (HOSPITAL_COMMUNITY)
Admission: EM | Admit: 2021-03-18 | Discharge: 2021-03-20 | DRG: 948 | Disposition: A | Discharge status: Nursing Home (Includes ICF) | Payer: Medicare Other | Source: Skilled Nursing Facility | Attending: Internal Medicine | Admitting: Internal Medicine

## 2021-03-18 ENCOUNTER — Other Ambulatory Visit: Payer: Self-pay

## 2021-03-18 DIAGNOSIS — J449 Chronic obstructive pulmonary disease, unspecified: Secondary | ICD-10-CM | POA: Diagnosis present

## 2021-03-18 DIAGNOSIS — Z888 Allergy status to other drugs, medicaments and biological substances status: Secondary | ICD-10-CM | POA: Diagnosis not present

## 2021-03-18 DIAGNOSIS — Z880 Allergy status to penicillin: Secondary | ICD-10-CM | POA: Diagnosis not present

## 2021-03-18 DIAGNOSIS — M797 Fibromyalgia: Secondary | ICD-10-CM | POA: Diagnosis present

## 2021-03-18 DIAGNOSIS — Z825 Family history of asthma and other chronic lower respiratory diseases: Secondary | ICD-10-CM

## 2021-03-18 DIAGNOSIS — R400 Somnolence: Secondary | ICD-10-CM | POA: Diagnosis not present

## 2021-03-18 DIAGNOSIS — I6523 Occlusion and stenosis of bilateral carotid arteries: Secondary | ICD-10-CM | POA: Diagnosis not present

## 2021-03-18 DIAGNOSIS — R1111 Vomiting without nausea: Secondary | ICD-10-CM | POA: Diagnosis not present

## 2021-03-18 DIAGNOSIS — F5105 Insomnia due to other mental disorder: Secondary | ICD-10-CM | POA: Diagnosis present

## 2021-03-18 DIAGNOSIS — Z7951 Long term (current) use of inhaled steroids: Secondary | ICD-10-CM

## 2021-03-18 DIAGNOSIS — I63411 Cerebral infarction due to embolism of right middle cerebral artery: Secondary | ICD-10-CM | POA: Diagnosis not present

## 2021-03-18 DIAGNOSIS — I10 Essential (primary) hypertension: Secondary | ICD-10-CM | POA: Diagnosis not present

## 2021-03-18 DIAGNOSIS — Z87891 Personal history of nicotine dependence: Secondary | ICD-10-CM | POA: Diagnosis not present

## 2021-03-18 DIAGNOSIS — I6381 Other cerebral infarction due to occlusion or stenosis of small artery: Secondary | ICD-10-CM | POA: Diagnosis not present

## 2021-03-18 DIAGNOSIS — Z8673 Personal history of transient ischemic attack (TIA), and cerebral infarction without residual deficits: Secondary | ICD-10-CM

## 2021-03-18 DIAGNOSIS — Z882 Allergy status to sulfonamides status: Secondary | ICD-10-CM

## 2021-03-18 DIAGNOSIS — F411 Generalized anxiety disorder: Secondary | ICD-10-CM | POA: Diagnosis present

## 2021-03-18 DIAGNOSIS — Z79899 Other long term (current) drug therapy: Secondary | ICD-10-CM

## 2021-03-18 DIAGNOSIS — Z66 Do not resuscitate: Secondary | ICD-10-CM | POA: Diagnosis present

## 2021-03-18 DIAGNOSIS — G629 Polyneuropathy, unspecified: Secondary | ICD-10-CM | POA: Diagnosis present

## 2021-03-18 DIAGNOSIS — H9190 Unspecified hearing loss, unspecified ear: Secondary | ICD-10-CM | POA: Diagnosis present

## 2021-03-18 DIAGNOSIS — K219 Gastro-esophageal reflux disease without esophagitis: Secondary | ICD-10-CM | POA: Diagnosis present

## 2021-03-18 DIAGNOSIS — M810 Age-related osteoporosis without current pathological fracture: Secondary | ICD-10-CM | POA: Diagnosis present

## 2021-03-18 DIAGNOSIS — I639 Cerebral infarction, unspecified: Secondary | ICD-10-CM | POA: Diagnosis not present

## 2021-03-18 DIAGNOSIS — R4182 Altered mental status, unspecified: Principal | ICD-10-CM | POA: Diagnosis present

## 2021-03-18 DIAGNOSIS — I6389 Other cerebral infarction: Secondary | ICD-10-CM | POA: Diagnosis not present

## 2021-03-18 DIAGNOSIS — Z20822 Contact with and (suspected) exposure to covid-19: Secondary | ICD-10-CM | POA: Diagnosis present

## 2021-03-18 DIAGNOSIS — Z8249 Family history of ischemic heart disease and other diseases of the circulatory system: Secondary | ICD-10-CM | POA: Diagnosis not present

## 2021-03-18 DIAGNOSIS — G9340 Encephalopathy, unspecified: Secondary | ICD-10-CM | POA: Diagnosis not present

## 2021-03-18 DIAGNOSIS — E7800 Pure hypercholesterolemia, unspecified: Secondary | ICD-10-CM | POA: Diagnosis present

## 2021-03-18 DIAGNOSIS — R404 Transient alteration of awareness: Secondary | ICD-10-CM | POA: Diagnosis not present

## 2021-03-18 LAB — URINALYSIS, ROUTINE W REFLEX MICROSCOPIC
Bilirubin Urine: NEGATIVE
Glucose, UA: NEGATIVE mg/dL
Hgb urine dipstick: NEGATIVE
Ketones, ur: NEGATIVE mg/dL
Leukocytes,Ua: NEGATIVE
Nitrite: NEGATIVE
Protein, ur: 30 mg/dL — AB
Specific Gravity, Urine: 1.012 (ref 1.005–1.030)
pH: 7 (ref 5.0–8.0)

## 2021-03-18 LAB — COMPREHENSIVE METABOLIC PANEL
ALT: 15 U/L (ref 0–44)
AST: 22 U/L (ref 15–41)
Albumin: 4.5 g/dL (ref 3.5–5.0)
Alkaline Phosphatase: 87 U/L (ref 38–126)
Anion gap: 9 (ref 5–15)
BUN: 18 mg/dL (ref 8–23)
CO2: 26 mmol/L (ref 22–32)
Calcium: 9.7 mg/dL (ref 8.9–10.3)
Chloride: 101 mmol/L (ref 98–111)
Creatinine, Ser: 0.99 mg/dL (ref 0.44–1.00)
GFR, Estimated: 55 mL/min — ABNORMAL LOW (ref >60)
Glucose, Bld: 143 mg/dL — ABNORMAL HIGH (ref 70–99)
Potassium: 3.9 mmol/L (ref 3.5–5.1)
Sodium: 136 mmol/L (ref 135–145)
Total Bilirubin: 0.9 mg/dL (ref 0.3–1.2)
Total Protein: 7.7 g/dL (ref 6.5–8.1)

## 2021-03-18 LAB — CBC WITH DIFFERENTIAL/PLATELET
Abs Immature Granulocytes: 0.03 10*3/uL (ref 0.00–0.07)
Basophils Absolute: 0 10*3/uL (ref 0.0–0.1)
Basophils Relative: 0 %
Eosinophils Absolute: 0 10*3/uL (ref 0.0–0.5)
Eosinophils Relative: 0 %
HCT: 41.3 % (ref 36.0–46.0)
Hemoglobin: 13.7 g/dL (ref 12.0–15.0)
Immature Granulocytes: 0 %
Lymphocytes Relative: 8 %
Lymphs Abs: 0.5 10*3/uL — ABNORMAL LOW (ref 0.7–4.0)
MCH: 30.6 pg (ref 26.0–34.0)
MCHC: 33.2 g/dL (ref 30.0–36.0)
MCV: 92.2 fL (ref 80.0–100.0)
Monocytes Absolute: 0.4 10*3/uL (ref 0.1–1.0)
Monocytes Relative: 6 %
Neutro Abs: 6.2 10*3/uL (ref 1.7–7.7)
Neutrophils Relative %: 86 %
Platelets: 204 10*3/uL (ref 150–400)
RBC: 4.48 MIL/uL (ref 3.87–5.11)
RDW: 13.6 % (ref 11.5–15.5)
WBC: 7.2 10*3/uL (ref 4.0–10.5)
nRBC: 0 % (ref 0.0–0.2)

## 2021-03-18 LAB — RESP PANEL BY RT-PCR (FLU A&B, COVID) ARPGX2
Influenza A by PCR: NEGATIVE
Influenza B by PCR: NEGATIVE
SARS Coronavirus 2 by RT PCR: NEGATIVE

## 2021-03-18 LAB — CBG MONITORING, ED: Glucose-Capillary: 94 mg/dL (ref 70–99)

## 2021-03-18 LAB — AMMONIA: Ammonia: 30 umol/L (ref 9–35)

## 2021-03-18 LAB — BLOOD GAS, VENOUS
Acid-Base Excess: 1.4 mmol/L (ref 0.0–2.0)
Bicarbonate: 26.4 mmol/L (ref 20.0–28.0)
O2 Saturation: 34.3 %
Patient temperature: 98.6
pCO2, Ven: 45.7 mmHg (ref 44.0–60.0)
pH, Ven: 7.38 (ref 7.250–7.430)
pO2, Ven: <31 mmHg — CL (ref 32.0–45.0)

## 2021-03-18 LAB — LACTIC ACID, PLASMA: Lactic Acid, Venous: 1.4 mmol/L (ref 0.5–1.9)

## 2021-03-18 LAB — PROTIME-INR
INR: 0.9 (ref 0.8–1.2)
Prothrombin Time: 12.4 seconds (ref 11.4–15.2)

## 2021-03-18 MED ORDER — ACETAMINOPHEN 650 MG RE SUPP: 650.0000 mg | RECTAL | Status: DC | PRN

## 2021-03-18 MED ORDER — ACETAMINOPHEN 325 MG PO TABS: 650.0000 mg | ORAL_TABLET | ORAL | Status: DC | PRN

## 2021-03-18 MED ORDER — STROKE: EARLY STAGES OF RECOVERY BOOK
Freq: Once | Status: AC
  Filled 2021-03-18: qty 1

## 2021-03-18 MED ORDER — SODIUM CHLORIDE 0.9 % IV SOLN: INTRAVENOUS | Status: DC

## 2021-03-18 MED ORDER — ACETAMINOPHEN 160 MG/5ML PO SOLN: 650.0000 mg | ORAL | Status: DC | PRN

## 2021-03-18 NOTE — ED Triage Notes (Signed)
Pt BIB EMS from assisted living facility. Staff served breakfast, found her incoherent and only to say "okay okay". They helped her to the bathroom but took multiple staff to get her back to room. Baseline A&O x4, ambulants with walker independently. Last seen well was third shift.   BP 168/98 P 80 RR 16 T 96.1 CBG 147

## 2021-03-18 NOTE — ED Provider Notes (Addendum)
[facility] DEPT Provider Note   CSN: [id number] Arrival date & time: 03/18/21  1022     History  Chief Complaint  Patient presents with   Altered Mental Status    Lisa King is a 88 y.o. female.  HPI     88 year old female comes in with chief complaint of altered mental status.  Level 5 caveat for altered mental status.  [facility]  They indicated that patient was last normal last night.  Spoke with patient's son, who indicates that patient was last communicated by him on Monday.  She was at her baseline normal at that time.  Patient's baseline normal is alert, able to carry a conversation and she is ambulatory.  This morning patient was found to be unresponsive by the nursing home staff.   Past medical history of fibromyalgia, hypertension, hyperlipidemia, COPD, insomnia.   Home Medications Prior to Admission medications   Medication Sig Start Date End Date Taking? Authorizing Provider  budesonide-formoterol (SYMBICORT) 80-4.5 MCG/ACT inhaler Inhale 1 puff into the lungs daily.    [provider]  calcium carbonate (TUMS EX) 750 MG chewable tablet Chew 750 mg by mouth daily.     [provider]  cholecalciferol (VITAMIN D) 1000 UNITS tablet Take 1,000 Units by mouth daily.    [provider]  cyclobenzaprine (FLEXERIL) 5 MG tablet Take 2.5 mg by mouth at bedtime.    [provider]  docusate sodium (COLACE) 100 MG capsule Take 100 mg by mouth 2 (two) times daily.    [provider]  ferrous sulfate 325 (65 FE) MG tablet Take 325 mg by mouth. Once A Day on Mon, Wed, Fri    [provider]  hydrocortisone 2.5 % cream Apply topically 2 (two) times daily as needed.    [provider]  hydrocortisone cream 1 % Apply 1 application topically 2 (two) times daily as needed (wrists).    [provider]  ketoconazole (NIZORAL) 2 % cream Apply 1  application topically 2 (two) times daily as needed for irritation.    [provider]  metoprolol tartrate (LOPRESSOR) 25 MG tablet Take 12.5 mg by mouth 2 (two) times daily.    [provider]  nitrofurantoin (MACRODANTIN) 50 MG capsule Take 50 mg by mouth at bedtime.    [provider]  Olopatadine HCl 0.2 % SOLN Apply 1 drop to eye daily.    [provider]  polyethylene glycol (MIRALAX / GLYCOLAX) 17 g packet Take 17 g by mouth 2 (two) times a week. Sunday and Wednesday    [provider]  pregabalin (LYRICA) 75 MG capsule Take 1 capsule (75 mg total) by mouth at bedtime. 02/04/21   Fargo, Amy E, NP  Propylene Glycol 0.6 % SOLN Apply 1 drop to eye 4 (four) times daily.    [provider]  vitamin B-12 (CYANOCOBALAMIN) 1000 MCG tablet Take 1,000 mcg by mouth daily.    [provider]      Allergies    Penicillins, Bisphosphonates, Influenza vaccines, Simvastatin, Trazodone and nefazodone, and Sulfonamide derivatives    Review of Systems   Review of Systems  Physical Exam Updated Vital Signs BP (!) 142/59    Pulse 75    Temp 97.9 F (36.6 C) (Axillary)    Resp 19    Ht 5\' 1"  (1.549 m)    Wt 65.9 kg    SpO2 97%    BMI 27.45 kg/m  Physical Exam Vitals and nursing note reviewed.  Constitutional:      Appearance: She is well-developed.     Comments: Somnolent  HENT:     Head: Atraumatic.     Mouth/Throat:     Mouth: Mucous membranes are moist.  Eyes:     Comments: 3 mm and equal, no gaze disturbance, reactive to light  Cardiovascular:     Rate and Rhythm: Normal rate.  Pulmonary:     Effort: Pulmonary effort is normal.  Musculoskeletal:     Cervical back: Neck supple.  Skin:    General: Skin is warm and dry.  Neurological:     Mental Status: She is disoriented.     Comments: Moving all 4 extremities, gross sensory exam appears to be normal    ED Results / Procedures / Treatments   Labs (all labs ordered are  listed, but only abnormal results are displayed) Labs Reviewed  CBC WITH DIFFERENTIAL/PLATELET - Abnormal; Notable for the following components:      Result Value   Lymphs Abs 0.5 (*)    All other components within normal limits  COMPREHENSIVE METABOLIC PANEL - Abnormal; Notable for the following components:   Glucose, Bld 143 (*)    GFR, Estimated 55 (*)    All other components within normal limits  URINALYSIS, ROUTINE W REFLEX MICROSCOPIC - Abnormal; Notable for the following components:   APPearance HAZY (*)    Protein, ur 30 (*)    Bacteria, UA RARE (*)    All other components within normal limits  BLOOD GAS, VENOUS - Abnormal; Notable for the following components:   pO2, Ven <31.0 (*)    All other components within normal limits  RESP PANEL BY RT-PCR (FLU A&B, COVID) ARPGX2  LACTIC ACID, PLASMA  PROTIME-INR  AMMONIA  CBG MONITORING, ED    EKG None ED ECG REPORT   Date: 03/18/2021  Rate: 77  Rhythm: normal sinus rhythm  QRS Axis: left  Intervals: normal  ST/T Wave abnormalities: nonspecific ST/T changes  Conduction Disutrbances:none  Narrative Interpretation:   Old EKG Reviewed: unchanged  I have personally reviewed the EKG tracing and agree with the computerized printout as noted.    Radiology CT Head Wo Contrast  Result Date: 03/18/2021 CLINICAL DATA:  Mental status change, unknown cause. Additional history provided: EXAM: CT HEAD WITHOUT CONTRAST TECHNIQUE: Contiguous axial images were obtained from the base of the skull through the vertex without intravenous contrast. RADIATION DOSE REDUCTION: This exam was performed according to the departmental dose-optimization program which includes automated exposure control, adjustment of the mA and/or kV according to patient size and/or use of iterative reconstruction technique. COMPARISON:  No pertinent prior exams available for comparison. FINDINGS: Brain: Mild-to-moderate generalized cerebral atrophy. Small  age-indeterminate infarct within the right corona radiata. Background moderate patchy and ill-defined hypoattenuation within the cerebral white matter, nonspecific but compatible with chronic small vessel ischemic disease. Chronic lacunar infarcts within the left basal ganglia and left thalamus. There is no acute intracranial hemorrhage. No demarcated cortical infarct. No extra-axial fluid collection. No evidence of an intracranial mass. No midline shift. Vascular: No hyperdense vessel. Atherosclerotic calcifications. Skull: Normal. Negative for fracture or focal lesion. Sinuses/Orbits: Visualized orbits show no acute finding. Trace mucosal thickening within the bilateral ethmoid sinuses. IMPRESSION: A small infarct within the right corona radiata is age-indeterminate (although favored subacute or chronic). A brain MRI may be obtained for further evaluation, as clinically warranted. Background moderate chronic small vessel ischemic changes within the cerebral  white matter. Chronic lacunar infarcts within the left basal ganglia and left thalamus. Mild-to-moderate generalized cerebral atrophy. Electronically Signed   By: Kellie Simmering D.O.   On: 03/18/2021 11:46    Procedures .Critical Care Performed by: Varney Biles, MD Authorized by: Varney Biles, MD   Critical care provider statement:    Critical care time (minutes):  40   Critical care was necessary to treat or prevent imminent or life-threatening deterioration of the following conditions:  CNS failure or compromise   Critical care was time spent personally by me on the following activities:  Development of treatment plan with patient or surrogate, discussions with consultants, evaluation of patient's response to treatment, examination of patient, ordering and review of laboratory studies, ordering and review of radiographic studies, ordering and performing treatments and interventions, pulse oximetry, re-evaluation of patient's condition, review of  old charts and obtaining history from patient or surrogate    Medications Ordered in ED Medications - No data to display  ED Course/ Medical Decision Making/ A&P                           Medical Decision Making Amount and/or Complexity of Data Reviewed Labs: ordered. Radiology: ordered.  Risk Decision regarding hospitalization.   This patient presents to the ED with chief complaint(s) of altered mental status with pertinent past medical history of hypertension, hyperlipidemia, COPD which further complicates the presenting complaint. The complaint involves an extensive differential diagnosis and treatment options and also carries with it a high risk of complications and morbidity.    The differential diagnosis includes : ICH / Stroke ACS Sepsis syndrome Infection - UTI/Pneumonia Encephalopathy  Electrolyte abnormality Drug overdose Medication side effects Metabolic disorders including thyroid disorders, adrenal insufficiency Hypercapnia / COPD Hypoxia  The initial plan is to get basic labs, urine test, CT scan of the brain.   Additional history obtained: Additional history obtained from family and nursing home/care facility Records reviewed Primary Care Documents  Reassessment and review: Lab Tests: I Ordered, and personally interpreted labs.  The pertinent results include: CBC, metabolic profile and urine analysis looking reassuring.  Venous blood gas does not reveal hypercapnic respiratory failure.  Imaging Studies ordered: I independently visualized and interpreted the following imaging CT scan of the brain which showed no evidence of acute intracranial bleed.  There is some evidence of subacute infarct -which is equivocal -per radiologist.   Cardiac Monitoring: The patient was maintained on a cardiac monitor.  I personally viewed and interpreted the cardiac monitor which showed an underlying rhythm of:  sinus rhythm    Reevaluation of the patient after these  medicines showed that the patient    stayed the same  Consultations Obtained: I requested consultation with the consultant Dr. Quinn Axe, neurology , and discussed  findings as well as pertinent plan - they recommend: That patient stay at [facility].  Suspicion for stroke causing the presentation of the patient is less likely.  EEG can be ordered while the patient is in the hospital here.  Complexity of problems addressed: Patients presentation is most consistent with  acute presentation with potential threat to life or bodily function During patient's assessment  Disposition: After consideration of the diagnostic results and the patients response to treatment,  I feel that the patent would benefit from admission to medicine service .   Final Clinical Impression(s) / ED Diagnoses Final diagnoses:  Somnolence    Rx / DC Orders ED Discharge Orders  None         Varney Biles, MD 03/18/21 [place], Hema Lanza, MD 03/18/21 1352

## 2021-03-18 NOTE — ED Notes (Signed)
Patient returned back from CT at this time.  °

## 2021-03-18 NOTE — Progress Notes (Signed)
EEG completed, results pending. 

## 2021-03-18 NOTE — H&P (Addendum)
History and Physical    Patient: Lisa King ZOX:[id number] DOB: 05-09-1931 DOA: 03/18/2021 DOS: the patient was seen and examined on 03/18/2021 PCP: Mast, Man X, NP  Patient coming from: ALF/ILF  Chief Complaint:  Chief Complaint  Patient presents with   Altered Mental Status    HPI: DENIZ CERULLO is a 88 y.o. female with medical history significant of HLD, HTN, COPD, neuropathy. Presenting with altered mental status. History is from chart review and son. Patient was in her normal state of health until this morning. The staff at her ALF noted that she was confused this morning and nauseous. She was repeating "okay". They were concerned for possible infection, so they called for EMS. Per her son, she was acting normal and communicating well through last night. She has not had any medication changes. He is not aware of her having any episodes like this in the past.   Review of Systems: unable to review all systems due to the inability of the patient to answer questions. 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     Fibromyalgia    GERD 12/19/2006   Qualifier: Diagnosis of  By: Renaldo Fiddler CMA, Marliss Czar  04/13/16 TSH 1.20, Na 141, K 4.2, Bun 15, creat 0.79, wbc 5.5, Hgb 11.5, plt 283    GERD (gastroesophageal reflux disease)    Hammer toe of second toe of left foot 04/08/2016   Left 2nd   Hearing loss     Hemorrhoids    Hypercholesterolemia    Hypertension    Osteoarthritis 12/19/2006   Qualifier: Diagnosis of  By: Renaldo Fiddler CMA, Leigh     Osteoporosis    Past Surgical History:  Procedure Laterality Date   BIOPSY  02/17/2020   Procedure: BIOPSY;  Surgeon: Lynann Bologna, MD;  Location: [facility];  Service: Endoscopy;;   CATARACT EXTRACTION, BILATERAL  2009   COLONOSCOPY  2009, 2006 , 2017   ESOPHAGOGASTRODUODENOSCOPY (EGD) WITH PROPOFOL N/A 02/17/2020   Procedure: ESOPHAGOGASTRODUODENOSCOPY (EGD) WITH PROPOFOL;  Surgeon: Lynann Bologna, MD;  Location: [facility];  Service: Endoscopy;  Laterality: N/A;   IR ANGIOGRAM SELECTIVE EACH ADDITIONAL VESSEL  02/17/2020   IR ANGIOGRAM SELECTIVE EACH ADDITIONAL VESSEL  02/17/2020   IR ANGIOGRAM SELECTIVE EACH ADDITIONAL VESSEL  02/17/2020   IR ANGIOGRAM SELECTIVE EACH ADDITIONAL VESSEL  02/17/2020   IR ANGIOGRAM SELECTIVE EACH ADDITIONAL VESSEL  02/17/2020   IR ANGIOGRAM VISCERAL SELECTIVE  02/17/2020   IR ANGIOGRAM VISCERAL SELECTIVE  02/17/2020   IR US GUIDE VASC ACCESS RIGHT  02/17/2020   stapes surgery     Dr Dorma Russell   TONSILLECTOMY AND ADENOIDECTOMY     as a child   Social History:  reports that she quit smoking about 11 years ago. Her smoking use included cigarettes. She  has never used smokeless tobacco. She reports current alcohol use. She reports that she does not use drugs.  Allergies  Allergen Reactions   Penicillins Anaphylaxis   Bisphosphonates Other (See Comments)    [name]   Influenza Vaccines     Pt reported   Simvastatin     Unknown    Trazodone And Nefazodone     Felt her throat was closing up/kept her awake   Sulfonamide Derivatives Rash and Other (See Comments)    blisters    Family History  Problem Relation Age of Onset   Heart disease Father    COPD Father    Hypertension Mother    Arthritis Mother     Prior to Admission medications   Medication Sig Start Date End Date Taking? Authorizing Provider   budesonide-formoterol (SYMBICORT) 80-4.5 MCG/ACT inhaler Inhale 1 puff into the lungs daily.    [provider]  calcium carbonate (TUMS EX) 750 MG chewable tablet Chew 750 mg by mouth daily.     [provider]  cholecalciferol (VITAMIN D) 1000 UNITS tablet Take 1,000 Units by mouth daily.    [provider]  cyclobenzaprine (FLEXERIL) 5 MG tablet Take 2.5 mg by mouth at bedtime.    [provider]  docusate sodium (COLACE) 100 MG capsule Take 100 mg by mouth 2 (two) times daily.    [provider]  ferrous sulfate 325 (65 FE) MG tablet Take 325 mg by mouth. Once A Day on Mon, Wed, Fri    [provider]  hydrocortisone 2.5 % cream Apply topically 2 (two) times daily as needed.    [provider]  hydrocortisone cream 1 % Apply 1 application topically 2 (two) times daily as needed (wrists).    [provider]  ketoconazole (NIZORAL) 2 % cream Apply 1 application topically 2 (two) times daily as needed for irritation.    [provider]  metoprolol tartrate (LOPRESSOR) 25 MG tablet Take 12.5 mg by mouth 2 (two) times daily.    [provider]  nitrofurantoin (MACRODANTIN) 50 MG capsule Take 50 mg by mouth at bedtime.    [provider]  Olopatadine HCl 0.2 % SOLN Apply 1 drop to eye daily.    [provider]  polyethylene glycol (MIRALAX / GLYCOLAX) 17 g packet Take 17 g by mouth 2 (two) times a week. Sunday and Wednesday    [provider]  pregabalin (LYRICA) 75 MG capsule Take 1 capsule (75 mg total) by mouth at bedtime. 02/04/21   Fargo, Amy E, NP  Propylene Glycol 0.6 % SOLN Apply 1 drop to eye 4 (four) times daily.    [provider]  vitamin B-12 (CYANOCOBALAMIN) 1000 MCG tablet Take 1,000 mcg by mouth daily.    [provider]    Physical Exam: Vitals:   03/18/21 1230 03/18/21 1340 03/18/21 1430 03/18/21 1530  BP: (!) 142/59 130/70 (!) 148/68 (!) 150/62   Pulse: 75 77 79 78  Resp: 19 16 20  (!) 21  Temp:      TempSrc:      SpO2: 97% 98% 99% 100%  Weight:      Height:       General: 88 y.o. female resting in bed in NAD Eyes: PERRL, normal sclera ENMT: Nares patent w/o discharge, orophaynx clear, dentition normal, ears w/o discharge/lesions/ulcers Neck: Supple, trachea midline Cardiovascular: RRR, +S1, S2, no m/g/r, equal pulses throughout Respiratory: CTABL, no w/r/r, normal WOB GI: BS+, NDNT, no  masses noted, no organomegaly noted MSK: No e/c/c Neuro: she is unable to answer questions, no focal deficits noted on exam  Data Reviewed:  CTH w/ subacute infarct in the right corona radiata UA is negative WBC wnl   Assessment and Plan: No notes have been filed under this hospital service. Service: Hospitalist  Altered mental status     - place in obs, tele     - etiology unknown     - EDP spoke with neurology about CVA, see below     - checking EEG     - UA is negative, follow ammonia     - fluids   Right corona radiata infarct     - likely subacute     - unable to get MRI d/t stapedes implant; spoke with neuro, repeat CTH in AM     - check echo, lipids, A1c     - permissive HTN for now  COPD     - resume home regimen as her mentation clears; will have PRN nebs available  Peripheral polyneuropathy     - resume home regimen as her mentation clears  HTN     - permissive HTN for now  Advance Care Planning: FULL  Consults: EDP spoke with neurology  Family Communication: w/ son by phone  Severity of Illness: The appropriate patient status for this patient is OBSERVATION. Observation status is judged to be reasonable and necessary in order to provide the required intensity of service to ensure the patient's safety. The patient's presenting symptoms, physical exam findings, and initial radiographic and laboratory data in the context of their medical condition is felt to place them at decreased risk for further clinical  deterioration. Furthermore, it is anticipated that the patient will be medically stable for discharge from the hospital within 2 midnights of admission.   Author: Teddy Spike, DO 03/18/2021 4:05 PM  For on call review [email or web address].

## 2021-03-18 NOTE — Care Plan (Signed)
Pt has Stapes implant on left side. Spoke with Neuro radiologist to confirm. Unable to locate place or year that implant was placed, Informed Dr Kathrynn Humble of this. MRI can not be preformed until surgical records of implant type have been obtained. Pt is altered and family unaware of when and wear this was placed.

## 2021-03-18 NOTE — ED Notes (Signed)
Patient transported to CT 

## 2021-03-19 ENCOUNTER — Observation Stay (HOSPITAL_BASED_OUTPATIENT_CLINIC_OR_DEPARTMENT_OTHER): Payer: Medicare Other

## 2021-03-19 ENCOUNTER — Observation Stay (HOSPITAL_COMMUNITY): Payer: Medicare Other

## 2021-03-19 ENCOUNTER — Inpatient Hospital Stay (HOSPITAL_COMMUNITY): Payer: Medicare Other

## 2021-03-19 DIAGNOSIS — Z7951 Long term (current) use of inhaled steroids: Secondary | ICD-10-CM | POA: Diagnosis not present

## 2021-03-19 DIAGNOSIS — R400 Somnolence: Secondary | ICD-10-CM | POA: Diagnosis present

## 2021-03-19 DIAGNOSIS — I63411 Cerebral infarction due to embolism of right middle cerebral artery: Secondary | ICD-10-CM | POA: Diagnosis not present

## 2021-03-19 DIAGNOSIS — Z20822 Contact with and (suspected) exposure to covid-19: Secondary | ICD-10-CM | POA: Diagnosis present

## 2021-03-19 DIAGNOSIS — Z880 Allergy status to penicillin: Secondary | ICD-10-CM | POA: Diagnosis not present

## 2021-03-19 DIAGNOSIS — I6523 Occlusion and stenosis of bilateral carotid arteries: Secondary | ICD-10-CM | POA: Diagnosis not present

## 2021-03-19 DIAGNOSIS — I6389 Other cerebral infarction: Secondary | ICD-10-CM | POA: Diagnosis not present

## 2021-03-19 DIAGNOSIS — Z8673 Personal history of transient ischemic attack (TIA), and cerebral infarction without residual deficits: Secondary | ICD-10-CM | POA: Diagnosis not present

## 2021-03-19 DIAGNOSIS — K219 Gastro-esophageal reflux disease without esophagitis: Secondary | ICD-10-CM | POA: Diagnosis present

## 2021-03-19 DIAGNOSIS — I639 Cerebral infarction, unspecified: Secondary | ICD-10-CM | POA: Diagnosis not present

## 2021-03-19 DIAGNOSIS — Z79899 Other long term (current) drug therapy: Secondary | ICD-10-CM | POA: Diagnosis not present

## 2021-03-19 DIAGNOSIS — Z66 Do not resuscitate: Secondary | ICD-10-CM | POA: Diagnosis present

## 2021-03-19 DIAGNOSIS — M810 Age-related osteoporosis without current pathological fracture: Secondary | ICD-10-CM | POA: Diagnosis present

## 2021-03-19 DIAGNOSIS — G629 Polyneuropathy, unspecified: Secondary | ICD-10-CM | POA: Diagnosis present

## 2021-03-19 DIAGNOSIS — J449 Chronic obstructive pulmonary disease, unspecified: Secondary | ICD-10-CM | POA: Diagnosis present

## 2021-03-19 DIAGNOSIS — G9340 Encephalopathy, unspecified: Secondary | ICD-10-CM | POA: Diagnosis not present

## 2021-03-19 DIAGNOSIS — I10 Essential (primary) hypertension: Secondary | ICD-10-CM | POA: Diagnosis present

## 2021-03-19 DIAGNOSIS — F5105 Insomnia due to other mental disorder: Secondary | ICD-10-CM | POA: Diagnosis present

## 2021-03-19 DIAGNOSIS — Z8249 Family history of ischemic heart disease and other diseases of the circulatory system: Secondary | ICD-10-CM | POA: Diagnosis not present

## 2021-03-19 DIAGNOSIS — Z882 Allergy status to sulfonamides status: Secondary | ICD-10-CM | POA: Diagnosis not present

## 2021-03-19 DIAGNOSIS — Z825 Family history of asthma and other chronic lower respiratory diseases: Secondary | ICD-10-CM | POA: Diagnosis not present

## 2021-03-19 DIAGNOSIS — F411 Generalized anxiety disorder: Secondary | ICD-10-CM | POA: Diagnosis present

## 2021-03-19 DIAGNOSIS — Z888 Allergy status to other drugs, medicaments and biological substances status: Secondary | ICD-10-CM | POA: Diagnosis not present

## 2021-03-19 DIAGNOSIS — R4182 Altered mental status, unspecified: Secondary | ICD-10-CM | POA: Diagnosis not present

## 2021-03-19 DIAGNOSIS — Z87891 Personal history of nicotine dependence: Secondary | ICD-10-CM | POA: Diagnosis not present

## 2021-03-19 DIAGNOSIS — H9190 Unspecified hearing loss, unspecified ear: Secondary | ICD-10-CM | POA: Diagnosis present

## 2021-03-19 DIAGNOSIS — E7800 Pure hypercholesterolemia, unspecified: Secondary | ICD-10-CM | POA: Diagnosis present

## 2021-03-19 DIAGNOSIS — M797 Fibromyalgia: Secondary | ICD-10-CM | POA: Diagnosis present

## 2021-03-19 LAB — VITAMIN B12: Vitamin B-12: 1441 pg/mL — ABNORMAL HIGH (ref 180–914)

## 2021-03-19 LAB — ECHOCARDIOGRAM COMPLETE
AR max vel: 2.16 cm2
AV Area VTI: 2.26 cm2
AV Area mean vel: 1.91 cm2
AV Mean grad: 8 mmHg
AV Peak grad: 13.4 mmHg
Ao pk vel: 1.83 m/s
Area-P 1/2: 2.33 cm2
Calc EF: 85.6 %
Height: 61 in
MV VTI: 0.49 cm2
S' Lateral: 3 cm
Single Plane A2C EF: 78.7 %
Single Plane A4C EF: 89.3 %
Weight: 2349.22 oz

## 2021-03-19 LAB — LIPID PANEL
Cholesterol: 192 mg/dL (ref 0–200)
HDL: 69 mg/dL (ref >40)
LDL Cholesterol: 107 mg/dL — ABNORMAL HIGH (ref 0–99)
Total CHOL/HDL Ratio: 2.8 RATIO
Triglycerides: 78 mg/dL (ref <150)
VLDL: 16 mg/dL (ref 0–40)

## 2021-03-19 LAB — TSH: TSH: 0.661 u[IU]/mL (ref 0.350–4.500)

## 2021-03-19 MED ORDER — IPRATROPIUM-ALBUTEROL 0.5-2.5 (3) MG/3ML IN SOLN: 3.0000 mL | Freq: Four times a day (QID) | RESPIRATORY_TRACT | Status: DC | PRN

## 2021-03-19 MED ORDER — IOHEXOL 350 MG/ML SOLN
75.0000 mL | Freq: Once | INTRAVENOUS | Status: AC | PRN
  Administered 2021-03-19: 75 mL via INTRAVENOUS

## 2021-03-19 MED ORDER — ENOXAPARIN SODIUM 30 MG/0.3ML IJ SOSY
30.0000 mg | PREFILLED_SYRINGE | INTRAMUSCULAR | Status: DC
  Administered 2021-03-19: 30 mg via SUBCUTANEOUS
  Filled 2021-03-19: qty 0.3

## 2021-03-19 MED ORDER — SODIUM CHLORIDE 0.9 % IV BOLUS: 500.0000 mL | Freq: Once | INTRAVENOUS | Status: DC

## 2021-03-19 MED ORDER — ASPIRIN 325 MG PO TABS
325.0000 mg | ORAL_TABLET | Freq: Every day | ORAL | Status: DC
  Administered 2021-03-20: 325 mg via ORAL
  Filled 2021-03-19: qty 1

## 2021-03-19 NOTE — Procedures (Signed)
Patient Name: Lisa King  MRN: [id number]  Epilepsy Attending: Lora Havens  Referring Physician/Provider: Jonnie Finner, DO Date:03/18/2021 Duration: 22.21 mins  Patient history: 88 year old female with altered mental status.  EEG to evaluate for seizure  Level of alertness: Awake  AEDs during EEG study: None  Technical aspects: This EEG study was done with scalp electrodes positioned according to the 10-20 International system of electrode placement. Electrical activity was acquired at a sampling rate of 500Hz  and reviewed with a high frequency filter of 70Hz  and a low frequency filter of 1Hz . EEG data were recorded continuously and digitally stored.   Description: EEG showed continuous generalized 3 to 6 Hz theta-delta slowing. Hyperventilation and photic stimulation were not performed.     ABNORMALITY - Continuous slow, generalized  IMPRESSION: This study is suggestive of moderate diffuse encephalopathy, nonspecific etiology. No seizures or epileptiform discharges were seen throughout the recording.  Lisa King Lisa King

## 2021-03-19 NOTE — Progress Notes (Signed)
°   03/19/21 0237  Vitals  Temp 98.4 F (36.9 C)  Temp Source Oral  BP (!) 84/35  MAP (mmHg) (!) 51  BP Location Left Arm  BP Method Automatic  Patient Position (if appropriate) Lying  Pulse Rate 79  Resp 16  MEWS COLOR  MEWS Score Color Green  Oxygen Therapy  SpO2 96 %  O2 Device Room Air  Pain Assessment  Pain Score Asleep  MEWS Score  MEWS Temp 0  MEWS Systolic 1  MEWS Pulse 0  MEWS RR 0  MEWS LOC 0  MEWS Score 1   These VS s are lower than previous Vss , [facility] informed via secure chat. Will fully evaluate pt and continue to monitor

## 2021-03-19 NOTE — Evaluation (Signed)
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     Fibromyalgia    GERD 12/19/2006   Qualifier: Diagnosis of  By: Renaldo Fiddler CMA, Marliss Czar  04/13/16 TSH 1.20, Na 141, K 4.2, Bun 15, creat 0.79, wbc 5.5, Hgb 11.5, plt 283    GERD (gastroesophageal reflux disease)    Hammer toe of second toe of left foot 04/08/2016   Left 2nd   Hearing loss    Hemorrhoids    Hypercholesterolemia    Hypertension    Osteoarthritis 12/19/2006   Qualifier: Diagnosis of  By: Renaldo Fiddler CMA, Marliss Czar     Osteoporosis    Past Surgical History:  Past Surgical History:  Procedure Laterality Date   BIOPSY  02/17/2020   Procedure: BIOPSY;  Surgeon: Lynann Bologna, MD;  Location: [facility];  Service: Endoscopy;;   CATARACT EXTRACTION, BILATERAL  2009   COLONOSCOPY  2009, 2006 , 2017   ESOPHAGOGASTRODUODENOSCOPY (EGD) WITH PROPOFOL N/A 02/17/2020   Procedure: ESOPHAGOGASTRODUODENOSCOPY (EGD) WITH PROPOFOL;  Surgeon: Lynann Bologna, MD;  Location: [facility];  Service: Endoscopy;  Laterality: N/A;   IR ANGIOGRAM SELECTIVE EACH ADDITIONAL VESSEL  02/17/2020   IR  ANGIOGRAM SELECTIVE EACH ADDITIONAL VESSEL  02/17/2020   IR ANGIOGRAM SELECTIVE EACH ADDITIONAL VESSEL  02/17/2020   IR ANGIOGRAM SELECTIVE EACH ADDITIONAL VESSEL  02/17/2020   IR ANGIOGRAM SELECTIVE EACH ADDITIONAL VESSEL  02/17/2020   IR ANGIOGRAM VISCERAL SELECTIVE  02/17/2020   IR ANGIOGRAM VISCERAL SELECTIVE  02/17/2020   IR US GUIDE VASC ACCESS RIGHT  02/17/2020   stapes surgery     Dr Dorma Russell   TONSILLECTOMY AND ADENOIDECTOMY     as a child   HPI:  88 yo female adm to [facility] with AMS - PMH for HLD, HTN, COPD, neuropathy.   Brain imaging showed subacute corona radiata CVA.  Pt unable to have MRI    Assessment / Plan / Recommendation  Clinical Impression  Patient presents with clinical indications concerning for oropharyngeal dysphagia - suspect due to current condition.  She was observed to have white viscous secretions in posterior oral cavity that SlP removed with oral suction.  Ms Grandberry can feed herself but appears with ataxic movements - oral and limb.  Immediate coughing episode with sequential swallows of water via straw. Small boluses of thin, applesauce, icecream, nectar thick juice tolerated without overt indication of aspiration.  Pt with difficulty feeding self and some diffiuculty with following oral motor exam instructions  - ? motor planning deficits?.  Prolonged oral transit  with applesauce and mastication with solids noted.  Small sips of thin water tolerated without coughing.  Pt also observed to have wheeze during intake - which [name] (granddaughter) states is normal.  Dentures and hearing aids are at patient's SNF and granddaughter left to obtain them. SLP recommends pt consume full liquid diet currently via cup.  Pt is having signficant expressive language deficits.  Will follow up for cognitive linguistic evaluation next date as pt appears overwhelmed today.  Educated pt/family to styrofoam cups being cut for "Nose hole" and diet/precautions. SLP Visit Diagnosis: Dysphagia, oral phase  (R13.11);Dysphagia, unspecified (R13.10)    Aspiration Risk  Mild aspiration risk;Risk for inadequate nutrition/hydration    Diet Recommendation Thin liquid;Nectar-thick liquid   Liquid Administration via: Cup Medication Administration: Other (Comment) (crushed with ice cream) Supervision: Patient able to self feed Compensations: Slow rate;Small sips/bites Postural Changes: Seated upright at 90 degrees;Remain upright for at least 30 minutes after po intake    Other  Recommendations Oral Care Recommendations: Oral care BID Other Recommendations: Have oral suction available    Recommendations for follow up therapy are one component of a multi-disciplinary discharge planning process, led by the attending physician.  Recommendations may be updated based on patient status, additional functional criteria and insurance authorization.  Follow up Recommendations Skilled nursing-short term rehab (<3 hours/day)      Assistance Recommended at Discharge Frequent or constant Supervision/AssistanceFull  Functional Status Assessment Patient has had a recent decline in their functional status and demonstrates the ability to make significant improvements in function in a reasonable and predictable amount of time.  Frequency and Duration min 1 x/week  1 week       Prognosis Prognosis for Safe Diet Advancement: Fair      Swallow Study   General Date of Onset: 03/19/21 HPI: 88 yo female adm to [facility] with AMS - PMH for HLD, HTN, COPD, neuropathy.   Brain imaging showed subacute corona radiata CVA.  Pt unable to have MRI Type of Study: Bedside Swallow Evaluation Diet Prior to this Study: NPO Temperature Spikes Noted: No Respiratory Status: Room air History of Recent Intubation: No Behavior/Cognition: Alert;Confused;Requires cueing;Distractible Oral Cavity Assessment: Dry;Excessive secretions Oral Care Completed by SLP: Yes Oral Cavity - Dentition: Edentulous;Dentures, not available Vision:  Impaired for self-feeding Self-Feeding Abilities: Total assist Patient Positioning: Upright in chair Baseline Vocal Quality: Normal Volitional Cough: Weak Volitional Swallow: Able to elicit    Oral/Motor/Sensory Function Overall Oral Motor/Sensory Function: Within functional limits   Ice Chips Ice chips: Within functional limits Presentation: Spoon   Thin Liquid Thin Liquid: Impaired Presentation: Cup;Straw;Spoon;Self Fed Pharyngeal  Phase Impairments: Cough - Immediate Other Comments: cough x2 with straw boluses, overt coughing with Yale swallow screen after consumption of approximately 2 ounces of water    Nectar Thick Nectar Thick Liquid: Within functional limits Presentation: Cup;Self Fed   Honey Thick Honey Thick Liquid: Not tested   Puree Puree: Within functional limits Presentation: Spoon   Solid     Solid: Impaired Oral Phase Impairments: Reduced lingual movement/coordination;Impaired mastication;Reduced labial seal Oral Phase Functional Implications: Prolonged oral transit;Impaired mastication;Oral holding;Oral residue Pharyngeal Phase Impairments: Suspected delayed Andrey Spearman 03/19/2021,4:01 PM  Rolena Infante, MS [facility] SLP Acute Rehab Services Office [phone] Cell [phone]

## 2021-03-19 NOTE — Evaluation (Signed)
Occupational Therapy Evaluation Patient Details Name: Lisa King MRN: [id number] DOB: 08-Jul-1931 Today's Date: 03/19/2021   History of Present Illness Patient is 88 y.o. female presented from [facility] due to AMS. CT head showed age-indeterminate right corona radiator infarct, small vessel changes, chronic infarcts, generalized atrophy.  Could not get MRI due to presence of stapedes implant. PMH significant for anxiety, asthma, COPD, GERD, fibro, osteoporosis, OA, HTN, HOH.   Clinical Impression   Pt admitted with the above diagnoses and presents with below problem list. Pt will benefit from continued acute OT to address the below listed deficits and maximize independence with basic ADLs prior to d/c to venue below. At baseline, pt is mod I with functional transfers/mobility, some assist needed with bathing/dressing. Pt currently needs min A +2 for bed mobility, min to mod A +2 with functional transfers and LB ADLs, mod A with UB ADLs. Pt able to take pivotal steps to sit up in recliner.        Recommendations for follow up therapy are one component of a multi-disciplinary discharge planning process, led by the attending physician.  Recommendations may be updated based on patient status, additional functional criteria and insurance authorization.   Follow Up Recommendations  Acute inpatient rehab (3hours/day)    Assistance Recommended at Discharge Frequent or constant Supervision/Assistance  Patient can return home with the following A lot of help with walking and/or transfers;A lot of help with bathing/dressing/bathroom    Functional Status Assessment  Patient has had a recent decline in their functional status and demonstrates the ability to make significant improvements in function in a reasonable and predictable amount of time.  Equipment Recommendations  None recommended by OT    Recommendations for Other Services       Precautions / Restrictions Precautions Precautions:  Fall Precaution Comments: monitor BP Restrictions Weight Bearing Restrictions: No      Mobility Bed Mobility Overal bed mobility: Needs Assistance Bed Mobility: Supine to Sit     Supine to sit: +2 for physical assistance, HOB elevated, Min assist     General bed mobility comments: assist for truncal support and to fully advance BLE off edge of bed and hips to EOB position. Extra time and effort. Pt using bed rail to pivot hips.    Transfers Overall transfer level: Needs assistance Equipment used: Rolling walker (2 wheels) Transfers: Sit to/from Stand, Bed to chair/wheelchair/BSC Sit to Stand: Mod assist, +2 physical assistance, From elevated surface     Step pivot transfers: Min assist, +2 physical assistance     General transfer comment: multimodal cues for hand placement and sequencing. Assist to powerup on initial stand from EOB, elevated height of bed a bit and able to stand with min +2 assist. Pivotl steps from EOB to recliner, sequencing, multimodal cues throughout.      Balance Overall balance assessment: Needs assistance Sitting-balance support: Feet supported, Bilateral upper extremity supported Sitting balance-Leahy Scale: Poor     Standing balance support: Reliant on assistive device for balance, Bilateral upper extremity supported Standing balance-Leahy Scale: Poor                             ADL either performed or assessed with clinical judgement   ADL Overall ADL's : Needs assistance/impaired Eating/Feeding: NPO   Grooming: Brushing hair;Minimal assistance;Sitting Grooming Details (indicate cue type and reason): min A for throughness/endurance Upper Body Bathing: Moderate assistance;Sitting   Lower Body Bathing: Moderate  assistance;+2 for physical assistance;Sit to/from stand;+2 for safety/equipment   Upper Body Dressing : Moderate assistance;Sitting   Lower Body Dressing: Moderate assistance;+2 for physical assistance;Sit to/from  stand;+2 for safety/equipment   Toilet Transfer: Moderate assistance;+2 for physical assistance;+2 for safety/equipment;Minimal assistance;Stand-pivot;BSC/3in1;Rolling walker (2 wheels) Toilet Transfer Details (indicate cue type and reason): simulated with EOB to recliner Toileting- Clothing Manipulation and Hygiene: Moderate assistance;+2 for physical assistance;Sit to/from stand         General ADL Comments: Pt able to complete bed mobility, sit EOB a few minutes then take pivotal steps to sit up in recliner. may benefit from further visual assessment once she has her glasses (not present on eval)     Vision   Additional Comments: Pt reporting she does not have socks on while sitting EOB looking at her feet (yellow socks on). Cognition vs vision? No eyeglasses in room on eval.     Perception     Praxis      Pertinent Vitals/Pain Pain Assessment Pain Assessment: Faces Faces Pain Scale: Hurts a little bit Pain Location: unspecified Pain Descriptors / Indicators: Grimacing Pain Intervention(s): Monitored during session     Hand Dominance     Extremity/Trunk Assessment Upper Extremity Assessment Upper Extremity Assessment: Generalized weakness   Lower Extremity Assessment Lower Extremity Assessment: Defer to PT evaluation       Communication Communication Communication: Expressive difficulties;HOH   Cognition Arousal/Alertness: Awake/alert Behavior During Therapy: WFL for tasks assessed/performed, Anxious Overall Cognitive Status: Impaired/Different from baseline Area of Impairment: Orientation, Attention, Memory, Following commands, Safety/judgement, Awareness, Problem solving                 Orientation Level: Time, Situation Current Attention Level: Sustained Memory: Decreased short-term memory, Decreased recall of precautions Following Commands: Follows one step commands inconsistently, Follows one step commands with increased time Safety/Judgement:  Decreased awareness of safety, Decreased awareness of deficits Awareness: Intellectual Problem Solving: Slow processing, Decreased initiation, Difficulty sequencing, Requires verbal cues General Comments: Internally distracted. tangential. "How long have I been here?" Able to recall that she lives at [facility] "for 5 years." Became tearful expressing that she needs to get back to [facility]. Expressive difficulties, cued to take her time with speech.     General Comments       Exercises     Shoulder Instructions      Home Living Family/patient expects to be discharged to:: Assisted living                                 Additional Comments: pt's son providing Home and PLOF. Pt has been resident at [facility] since Jan 2018. She has assist from staff for ADL's and is typically ambulating with RW to go to bathroom or dinning hall.      Prior Functioning/Environment Prior Level of Function : Needs assist             Mobility Comments: per son uses RW for gait and transfers. ADLs Comments: son reporting he believes the staff helps her with bathing, she may dress herself but is unsure.        OT Problem List: Decreased strength;Decreased activity tolerance;Impaired balance (sitting and/or standing);Decreased cognition;Decreased knowledge of use of DME or AE;Decreased knowledge of precautions      OT Treatment/Interventions: Self-care/ADL training;Therapeutic exercise;DME and/or AE instruction;Therapeutic activities;Patient/family education;Balance training    OT Goals(Current goals can be found in the care plan section)  Acute Rehab OT Goals Patient Stated Goal: get back to [facility] OT Goal Formulation: With patient Time For Goal Achievement: 04/02/21 Potential to Achieve Goals: Good ADL Goals Pt Will Perform Grooming: with supervision;sitting;with set-up Pt Will Perform Upper Body Dressing: with set-up;with supervision;sitting Pt Will Perform Lower  Body Dressing: with min assist;sit to/from stand Pt Will Transfer to Toilet: with min assist;ambulating Pt Will Perform Toileting - Clothing Manipulation and hygiene: with min assist;sitting/lateral leans;sit to/from stand Additional ADL Goal #1: Pt will complete bed mobility at min A +1 level to prepare for EOB/OOB ADLs.  OT Frequency: Min 2X/week    Co-evaluation              AM-PAC OT "6 Clicks" Daily Activity     Outcome Measure Help from another person eating meals?: None Help from another person taking care of personal grooming?: A Little Help from another person toileting, which includes using toliet, bedpan, or urinal?: A Lot Help from another person bathing (including washing, rinsing, drying)?: A Lot Help from another person to put on and taking off regular upper body clothing?: A Little Help from another person to put on and taking off regular lower body clothing?: A Lot 6 Click Score: 16   End of Session Equipment Utilized During Treatment: Rolling walker (2 wheels) Nurse Communication: Other (comment);Mobility status (NT: chair alarm needs batteries)  Activity Tolerance: Patient limited by fatigue;Patient tolerated treatment well Patient left: in chair;with call bell/phone within reach;with chair alarm set;Other (comment) (needs batteries, NT notified. fall mat in place.)  OT Visit Diagnosis: Unsteadiness on feet (R26.81);Muscle weakness (generalized) (M62.81);Cognitive communication deficit (R41.841);Other symptoms and signs involving cognitive function                Time: 1241-1316 OT Time Calculation (min): 35 min Charges:  OT General Charges $OT Visit: 1 Visit OT Evaluation $OT Eval Low Complexity: 1 Low OT Treatments $Self Care/Home Management : 8-22 mins  Raynald Kemp, OT Acute Rehabilitation Services Office: [phone]   Pilar Grammes 03/19/2021, 1:43 PM

## 2021-03-19 NOTE — Evaluation (Addendum)
Physical Therapy Evaluation Patient Details Name: Lisa King MRN: [id number] DOB: 12/22/1931 Today's Date: 03/19/2021  History of Present Illness  Patient is 88 y.o. female presented from [facility] due to AMS. CT head showed age-indeterminate right corona radiator infarct, small vessel changes, chronic infarcts, generalized atrophy.  Could not get MRI due to presence of stapedes implant. PMH significant for anxiety, asthma, COPD, GERD, fibro, osteoporosis, OA, HTN, HOH.    Clinical Impression  Lisa King is 88 y.o. female admitted with above HPI and diagnosis. Patient is currently limited by functional impairments below (see PT problem list). Patient has been a resident at [facility] for ~4 years and is independent with RW for transfers and gait at baseline with minimal assist from staff for ADL's. Patient is significantly below her baseline level of independent and required Mo-Max +2 assist for bed mobility and transfers. Pt demonstrates impairments in strength, balance, and cognitive impairments affecting sequencing/problem solving. Patient will benefit from continued skilled PT interventions to address impairments and progress independence with mobility, recommending AIR for intense therapy follow up as pt will benefit from all therapy disciplines to regain PLOF. Acute PT will follow and progress as able.        Recommendations for follow up therapy are one component of a multi-disciplinary discharge planning process, led by the attending physician.  Recommendations may be updated based on patient status, additional functional criteria and insurance authorization.  Follow Up Recommendations Acute inpatient rehab (3hours/day)    Assistance Recommended at Discharge Frequent or constant Supervision/Assistance  Patient can return home with the following  Two people to help with walking and/or transfers;Two people to help with bathing/dressing/bathroom;Direct supervision/assist for  medications management;Assistance with feeding;Assistance with cooking/housework;Direct supervision/assist for financial management;Assist for transportation;Help with stairs or ramp for entrance    Equipment Recommendations  (TBA)  Recommendations for Other Services  Rehab consult;OT consult;Speech consult    Functional Status Assessment Patient has had a recent decline in their functional status and demonstrates the ability to make significant improvements in function in a reasonable and predictable amount of time.     Precautions / Restrictions Precautions Precautions: Fall Precaution Comments: monitor BP Restrictions Weight Bearing Restrictions: No      Mobility  Bed Mobility Overal bed mobility: Needs Assistance Bed Mobility: Supine to Sit, Sit to Supine     Supine to sit: Max assist, +2 for safety/equipment, +2 for physical assistance, HOB elevated Sit to supine: Max assist, +2 for physical assistance, +2 for safety/equipment   General bed mobility comments: Max Assist to sequence move to Lt side and to bend bil knees and move feet off EOB. pt repeatedly brought feet back onto bed and required manual assist/control of LE's from therapist. Max assist to bring head/trunk up from sidelying. Pt required Max/Total assist to control return to supine and reposition in bed.    Transfers Overall transfer level: Needs assistance Equipment used: 2 person hand held assist Transfers: Sit to/from Stand Sit to Stand: Mod assist, Max assist, +2 physical assistance, +2 safety/equipment           General transfer comment: [facility] assist with Mod-Max assist +2 for rise from EOB. Pt unable to take small steps laterally once standing. required assist to control lower to sit EOB.    Ambulation/Gait                  Careers information officer  Modified Rankin (Stroke Patients Only)       Balance Overall balance assessment: Needs assistance Sitting-balance  support: Feet supported, Bilateral upper extremity supported Sitting balance-Leahy Scale: Poor     Standing balance support: Reliant on assistive device for balance, Bilateral upper extremity supported Standing balance-Leahy Scale: Poor                               Pertinent Vitals/Pain Pain Assessment Pain Assessment: PAINAD Breathing: normal Negative Vocalization: occasional moan/groan, low speech, negative/disapproving quality Facial Expression: sad, frightened, frown Body Language: relaxed Consolability: distracted or reassured by voice/touch PAINAD Score: 3    Home Living Family/patient expects to be discharged to:: Assisted living                   Additional Comments: pt's son providing Home and PLOF. Pt has been resident at [facility] since Jan 2018. She has assist from staff for ADL's and is typically ambulating with RW to go to bathroom or dinning hall.    Prior Function Prior Level of Function : Needs assist             Mobility Comments: per son uses RW for gait and transfers. ADLs Comments: son reporting he believes the staff helps her with bathing, she may dress herself but is unsure.     Hand Dominance        Extremity/Trunk Assessment   Upper Extremity Assessment Upper Extremity Assessment: Generalized weakness;Defer to OT evaluation    Lower Extremity Assessment Lower Extremity Assessment: Generalized weakness;RLE deficits/detail;LLE deficits/detail RLE Deficits / Details: pt able to follow commands with tactile cues to pump ankles and ext/flex bil knees. strength 3+/5 overall. LLE Deficits / Details: pt able to follow commands with tactile cues to pump ankles and ext/flex bil knees. strength 3+/5 overall.    Cervical / Trunk Assessment Cervical / Trunk Assessment: Normal  Communication   Communication: Expressive difficulties;HOH  Cognition Arousal/Alertness: Awake/alert Behavior During Therapy: WFL for tasks  assessed/performed, Anxious Overall Cognitive Status: Impaired/Different from baseline Area of Impairment: Memory, Following commands, Safety/judgement, Problem solving, Awareness, Orientation, Attention                 Orientation Level: Disoriented to, Place, Time, Situation Current Attention Level: Sustained Memory: Decreased short-term memory Following Commands: Follows one step commands inconsistently, Follows one step commands with increased time Safety/Judgement: Decreased awareness of safety, Decreased awareness of deficits Awareness: Intellectual Problem Solving: Slow processing, Decreased initiation, Difficulty sequencing, Requires verbal cues General Comments: pt able to recall in hospital after ~5-10 minutes when MD asked. At start of session pt able to state her first name only and DOB. therapist reporiented her to setting, place, and self. Pt asking "who is that" when she sat and saw her son sitting in room. Pt normally knows who her son is but could not recognize him and began tearful crying on therapist when she realized she didn't know who he was. Pt able to consoled and continue with therapy.        General Comments      Exercises     Assessment/Plan    PT Assessment Patient needs continued PT services  PT Problem List Decreased strength;Decreased activity tolerance;Decreased balance;Decreased mobility;Decreased cognition;Decreased knowledge of use of DME;Decreased safety awareness;Decreased knowledge of precautions       PT Treatment Interventions DME instruction;Gait training;Functional mobility training;Therapeutic activities;Therapeutic exercise;Balance training;Neuromuscular re-education;Patient/family education    PT Goals (  Current goals can be found in the Care Plan section)  Acute Rehab PT Goals Patient Stated Goal: none stated by pt, son for pt to regain mobility PT Goal Formulation: With family Time For Goal Achievement: 04/02/21 Potential to  Achieve Goals: Good    Frequency Min 4X/week     Co-evaluation               AM-PAC PT "6 Clicks" Mobility  Outcome Measure Help needed turning from your back to your side while in a flat bed without using bedrails?: A Lot Help needed moving from lying on your back to sitting on the side of a flat bed without using bedrails?: Total Help needed moving to and from a bed to a chair (including a wheelchair)?: Total Help needed standing up from a chair using your arms (e.g., wheelchair or bedside chair)?: Total Help needed to walk in hospital room?: Total Help needed climbing 3-5 steps with a railing? : Total 6 Click Score: 7    End of Session Equipment Utilized During Treatment: Gait belt Activity Tolerance: Patient tolerated treatment well Patient left: in bed;with call bell/phone within reach;with bed alarm set;with family/visitor present Nurse Communication: Mobility status PT Visit Diagnosis: Muscle weakness (generalized) (M62.81);Difficulty in walking, not elsewhere classified (R26.2);Unsteadiness on feet (R26.81);Other symptoms and signs involving the nervous system (R29.898)    Time: 1610-9604 PT Time Calculation (min) (ACUTE ONLY): 23 min   Charges:   PT Evaluation $PT Eval Moderate Complexity: 1 Mod PT Treatments $Therapeutic Activity: 8-22 mins        Wynn Maudlin, DPT Acute Rehabilitation Services Office [phone] Pager [phone]   Anitra Lauth 03/19/2021, 12:32 PM

## 2021-03-19 NOTE — Progress Notes (Signed)
PROGRESS NOTE    Lisa King  ZOX:[id number] DOB: 11-May-1931 DOA: 03/18/2021 PCP: Mast, Man X, NP   Chief Complain: Altered mental status  Brief Narrative:  Patient is a 88 year old female with history of hypertension, hyperlipidemia, COPD, neuropathy who presented from ALF with complaints of altered mental status.  She was in her normal state of health until the day before admission.  Staff at ALF found her confused, nauseous.  On presentation, she was hemodynamically stable.  Lab works with fine.  CT head showed age-indeterminate right corona radiator infarct, small vessel changes, chronic infarcts, generalized atrophy.  Could not get MRI due to presence of stapedes implant.  Neurology consulted.  Plan for repeat CT head today.  Assessment & Plan:   Active Problems:   COPD (chronic obstructive pulmonary disease) with chronic bronchitis (HCC)   HTN (hypertension)   Altered mental status   CVA (cerebral vascular accident) (HCC)   Altered mental status: Unclear etiology.  Usually alert and oriented.  Lives at ALF.  EEG did not show any seizure activity.  UA negative for UTI.  Ammonia level normal.  Could not get MRI of the brain due to presence of stapedes implant.  CT head on presentation showed age-indeterminate right corona radiator infarct, small vessel changes, chronic infarcts, generalized atrophy. Neurologic consulted.  We will repeat CT head today. We will follow-up with neurology recommendation for a stroke work-up initiation, PT/OT/speech evaluation. LDL of 107.  Hemoglobin A1C pending. We will check vitamin B12, iron level,UDS.  Delirium precautions. Start on aspirin 325 mg daily, will modify as per neurology recommendation.  She is allergic to statin. Her mental status has improved.  She is currently oriented to place.  As per son, she she is alert oriented at baseline.  History of hypertension: Blood pressure currently soft..  Home medications on hold.  Takes metoprolol.   Continue gentle iv fluids  History of COPD: Currently not on exacerbation.  Continue bronchodilators as needed.  History of peripheral neuropathy: Takes pregabalin.  Currently on hold       DVT prophylaxis: Lovenox Code Status: Full code Family Communication: Son at bedside Patient status: Inpatient  Dispo: The patient is from: ALF              Anticipated d/c is to: ALF versus SNF              Anticipated d/c date is: Needs to complete full work-up  Consultants: Neurology  Procedures: None  Antimicrobials:  Anti-infectives (From admission, onward)    None       Subjective: Patient seen and examined at the bedside this morning.  During my evaluation she was actually hypertensive.  Son at the bedside.  She looked comfortable, lying in bed.  She is hard of hearing.  Oriented to place only.  Objective: Vitals:   03/18/21 2006 03/18/21 2222 03/19/21 0237 03/19/21 0641  BP: (!) 145/75 (!) 139/46 (!) 84/35 (!) 94/52  Pulse: 75 83 79   Resp: 20 (!) 24 16   Temp: (!) 100.8 F (38.2 C) 100.3 F (37.9 C) 98.4 F (36.9 C) 98.6 F (37 C)  TempSrc:  Oral Oral Oral  SpO2: 98% 97% 96% 97%  Weight:      Height:        Intake/Output Summary (Last 24 hours) at 03/19/2021 0848 Last data filed at 03/18/2021 1900 Gross per 24 hour  Intake 0 ml  Output --  Net 0 ml   [employer]  03/18/21 1052 03/18/21 1837  Weight: 65.9 kg 66.6 kg    Examination:  General exam: Deconditioned elderly female, overall comfortable  HEENT: PERRL Respiratory system:  no wheezes or crackles  Cardiovascular system: S1 & S2 heard, RRR.  Gastrointestinal system: Abdomen is nondistended, soft and nontender. Central nervous system: Alert and awake, oriented to place only.  Not agitated, calm and cooperative but very hard of hearing and could not participate in full neurological examination.  Cranial nerves intact. Extremities: No edema, no clubbing ,no cyanosis Skin: No rashes, no ulcers,no  icterus      Data Reviewed: I have personally reviewed following labs and imaging studies  CBC: Recent Labs  Lab 03/18/21 1039  WBC 7.2  NEUTROABS 6.2  HGB 13.7  HCT 41.3  MCV 92.2  PLT 204   Basic Metabolic Panel: Recent Labs  Lab 03/18/21 1039  NA 136  K 3.9  CL 101  CO2 26  GLUCOSE 143*  BUN 18  CREATININE 0.99  CALCIUM 9.7   GFR: Estimated Creatinine Clearance: 35 mL/min (by C-G formula based on SCr of 0.99 mg/dL). Liver Function Tests: Recent Labs  Lab 03/18/21 1039  AST 22  ALT 15  ALKPHOS 87  BILITOT 0.9  PROT 7.7  ALBUMIN 4.5   No results for input(s): LIPASE, AMYLASE in the last 168 hours. Recent Labs  Lab 03/18/21 1856  AMMONIA 30   Coagulation Profile: Recent Labs  Lab 03/18/21 1039  INR 0.9   Cardiac Enzymes: No results for input(s): CKTOTAL, CKMB, CKMBINDEX, TROPONINI in the last 168 hours. BNP (last 3 results) No results for input(s): PROBNP in the last 8760 hours. HbA1C: No results for input(s): HGBA1C in the last 72 hours. CBG: Recent Labs  Lab 03/18/21 1112  GLUCAP 94   Lipid Profile: Recent Labs    03/19/21 0545  CHOL 192  HDL 69  LDLCALC 107*  TRIG 78  CHOLHDL 2.8   Thyroid Function Tests: No results for input(s): TSH, T4TOTAL, FREET4, T3FREE, THYROIDAB in the last 72 hours. Anemia Panel: No results for input(s): VITAMINB12, FOLATE, FERRITIN, TIBC, IRON, RETICCTPCT in the last 72 hours. Sepsis Labs: Recent Labs  Lab 03/18/21 1039  LATICACIDVEN 1.4    Recent Results (from the past 240 hour(s))  Resp Panel by RT-PCR (Flu A&B, Covid) Nasopharyngeal Swab     Status: None   Collection Time: 03/18/21  1:12 PM   Specimen: Nasopharyngeal Swab; Nasopharyngeal(NP) swabs in vial transport medium  Result Value Ref Range Status   SARS Coronavirus 2 by RT PCR NEGATIVE NEGATIVE Final    Comment: (NOTE) SARS-CoV-2 target nucleic acids are NOT DETECTED.  The SARS-CoV-2 RNA is generally detectable in upper  respiratory specimens during the acute phase of infection. The lowest concentration of SARS-CoV-2 viral copies this assay can detect is 138 copies/mL. A negative result does not preclude SARS-Cov-2 infection and should not be used as the sole basis for treatment or other patient management decisions. A negative result may occur with  improper specimen collection/handling, submission of specimen other than nasopharyngeal swab, presence of viral mutation(s) within the areas targeted by this assay, and inadequate number of viral copies(<138 copies/mL). A negative result must be combined with clinical observations, patient history, and epidemiological information. The expected result is Negative.  Fact Sheet for Patients:  BloggerCourse.com  Fact Sheet for Healthcare Providers:  SeriousBroker.it  This test is no t yet approved or cleared by the [place] FDA and  has been authorized for detection and/or diagnosis  of SARS-CoV-2 by FDA under an Emergency Use Authorization (EUA). This EUA will remain  in effect (meaning this test can be used) for the duration of the COVID-19 declaration under Section 564(b)(1) of the Act, 21 U.S.C.section 360bbb-3(b)(1), unless the authorization is terminated  or revoked sooner.       Influenza A by PCR NEGATIVE NEGATIVE Final   Influenza B by PCR NEGATIVE NEGATIVE Final    Comment: (NOTE) The Xpert Xpress SARS-CoV-2/FLU/RSV plus assay is intended as an aid in the diagnosis of influenza from Nasopharyngeal swab specimens and should not be used as a sole basis for treatment. Nasal washings and aspirates are unacceptable for Xpert Xpress SARS-CoV-2/FLU/RSV testing.  Fact Sheet for Patients: BloggerCourse.com  Fact Sheet for Healthcare Providers: SeriousBroker.it  This test is not yet approved or cleared by the [place] FDA and has been  authorized for detection and/or diagnosis of SARS-CoV-2 by FDA under an Emergency Use Authorization (EUA). This EUA will remain in effect (meaning this test can be used) for the duration of the COVID-19 declaration under Section 564(b)(1) of the Act, 21 U.S.C. section 360bbb-3(b)(1), unless the authorization is terminated or revoked.  Performed at [facility], [address], [place], [place] [address]          Radiology Studies: CT Head Wo Contrast  Result Date: 03/18/2021 CLINICAL DATA:  Mental status change, unknown cause. Additional history provided: EXAM: CT HEAD WITHOUT CONTRAST TECHNIQUE: Contiguous axial images were obtained from the base of the skull through the vertex without intravenous contrast. RADIATION DOSE REDUCTION: This exam was performed according to the departmental dose-optimization program which includes automated exposure control, adjustment of the mA and/or kV according to patient size and/or use of iterative reconstruction technique. COMPARISON:  No pertinent prior exams available for comparison. FINDINGS: Brain: Mild-to-moderate generalized cerebral atrophy. Small age-indeterminate infarct within the right corona radiata. Background moderate patchy and ill-defined hypoattenuation within the cerebral white matter, nonspecific but compatible with chronic small vessel ischemic disease. Chronic lacunar infarcts within the left basal ganglia and left thalamus. There is no acute intracranial hemorrhage. No demarcated cortical infarct. No extra-axial fluid collection. No evidence of an intracranial mass. No midline shift. Vascular: No hyperdense vessel. Atherosclerotic calcifications. Skull: Normal. Negative for fracture or focal lesion. Sinuses/Orbits: Visualized orbits show no acute finding. Trace mucosal thickening within the bilateral ethmoid sinuses. IMPRESSION: A small infarct within the right corona radiata is age-indeterminate (although favored  subacute or chronic). A brain MRI may be obtained for further evaluation, as clinically warranted. Background moderate chronic small vessel ischemic changes within the cerebral white matter. Chronic lacunar infarcts within the left basal ganglia and left thalamus. Mild-to-moderate generalized cerebral atrophy. Electronically Signed   By: Jackey Loge D.O.   On: 03/18/2021 11:46   EEG adult  Result Date: 03/19/2021 Charlsie Quest, MD     03/19/2021  8:36 AM Patient Name: Lisa King MRN: [id number] Epilepsy Attending: Charlsie Quest Referring Physician/Provider: Teddy Spike, DO Date:03/18/2021 Duration: 22.21 mins Patient history: 88 year old female with altered mental status.  EEG to evaluate for seizure Level of alertness: Awake AEDs during EEG study: None Technical aspects: This EEG study was done with scalp electrodes positioned according to the 10-20 International system of electrode placement. Electrical activity was acquired at a sampling rate of 500Hz  and reviewed with a high frequency filter of 70Hz  and a low frequency filter of 1Hz . EEG data were recorded continuously and digitally stored. Description: EEG showed continuous generalized  3 to 6 Hz theta-delta slowing. Hyperventilation and photic stimulation were not performed.   ABNORMALITY - Continuous slow, generalized IMPRESSION: This study is suggestive of moderate diffuse encephalopathy, nonspecific etiology. No seizures or epileptiform discharges were seen throughout the recording. Priyanka Annabelle Harman        Scheduled Meds: Continuous Infusions:  sodium chloride 75 mL/hr at 03/18/21 2223     LOS: 0 days         Burnadette Pop, MD [facility] P2/03/2021, 8:48 AM

## 2021-03-19 NOTE — Plan of Care (Signed)
Patient seen and examined  Consult note to follow however, there will likely be no further recommendations.   Electronically signed by:  Lynnae Sandhoff, MD Page: [phone] 03/19/2021, 10:12 PM

## 2021-03-19 NOTE — Plan of Care (Signed)
Pt is admitted from ED after EMS brought pt in from [place] d/t altered LOC and inability to communicate with their staff ( which they say is unusual for pt)

## 2021-03-19 NOTE — Progress Notes (Signed)
° °  Echocardiogram 2D Echocardiogram has been performed.  Lisa King 03/19/2021, 11:16 AM

## 2021-03-19 NOTE — Progress Notes (Signed)
? ?  Inpatient Rehab Admissions Coordinator : ? ?Per therapy recommendations, patient was screened for CIR candidacy by Chiquitta Matty RN MSN.  At this time patient appears to be a potential candidate for CIR. I will place a rehab consult per protocol for full assessment. Please call me with any questions. ? ?Jayel Inks RN MSN ?Admissions Coordinator ?[phone] ?  ?

## 2021-03-20 ENCOUNTER — Other Ambulatory Visit: Payer: Self-pay

## 2021-03-20 DIAGNOSIS — I10 Essential (primary) hypertension: Secondary | ICD-10-CM

## 2021-03-20 DIAGNOSIS — I63411 Cerebral infarction due to embolism of right middle cerebral artery: Secondary | ICD-10-CM

## 2021-03-20 DIAGNOSIS — J449 Chronic obstructive pulmonary disease, unspecified: Secondary | ICD-10-CM

## 2021-03-20 DIAGNOSIS — G9340 Encephalopathy, unspecified: Secondary | ICD-10-CM

## 2021-03-20 LAB — HEMOGLOBIN A1C
Hgb A1c MFr Bld: 6 % — ABNORMAL HIGH (ref 4.8–5.6)
Mean Plasma Glucose: 126 mg/dL

## 2021-03-20 LAB — CBC WITH DIFFERENTIAL/PLATELET
Abs Immature Granulocytes: 0.03 10*3/uL (ref 0.00–0.07)
Basophils Absolute: 0 10*3/uL (ref 0.0–0.1)
Basophils Relative: 1 %
Eosinophils Absolute: 0.1 10*3/uL (ref 0.0–0.5)
Eosinophils Relative: 1 %
HCT: 34.1 % — ABNORMAL LOW (ref 36.0–46.0)
Hemoglobin: 11.1 g/dL — ABNORMAL LOW (ref 12.0–15.0)
Immature Granulocytes: 0 %
Lymphocytes Relative: 13 %
Lymphs Abs: 1 10*3/uL (ref 0.7–4.0)
MCH: 30.5 pg (ref 26.0–34.0)
MCHC: 32.6 g/dL (ref 30.0–36.0)
MCV: 93.7 fL (ref 80.0–100.0)
Monocytes Absolute: 0.7 10*3/uL (ref 0.1–1.0)
Monocytes Relative: 9 %
Neutro Abs: 6 10*3/uL (ref 1.7–7.7)
Neutrophils Relative %: 76 %
Platelets: 145 10*3/uL — ABNORMAL LOW (ref 150–400)
RBC: 3.64 MIL/uL — ABNORMAL LOW (ref 3.87–5.11)
RDW: 13.7 % (ref 11.5–15.5)
WBC: 7.8 10*3/uL (ref 4.0–10.5)
nRBC: 0 % (ref 0.0–0.2)

## 2021-03-20 LAB — BASIC METABOLIC PANEL
Anion gap: 7 (ref 5–15)
BUN: 20 mg/dL (ref 8–23)
CO2: 22 mmol/L (ref 22–32)
Calcium: 8.1 mg/dL — ABNORMAL LOW (ref 8.9–10.3)
Chloride: 106 mmol/L (ref 98–111)
Creatinine, Ser: 0.7 mg/dL (ref 0.44–1.00)
GFR, Estimated: >60 mL/min (ref >60)
Glucose, Bld: 96 mg/dL (ref 70–99)
Potassium: 3.2 mmol/L — ABNORMAL LOW (ref 3.5–5.1)
Sodium: 135 mmol/L (ref 135–145)

## 2021-03-20 MED ORDER — ENOXAPARIN SODIUM 40 MG/0.4ML IJ SOSY
40.0000 mg | PREFILLED_SYRINGE | INTRAMUSCULAR | Status: DC
  Administered 2021-03-20: 40 mg via SUBCUTANEOUS
  Filled 2021-03-20: qty 0.4

## 2021-03-20 MED ORDER — ENSURE ENLIVE PO LIQD
237.0000 mL | Freq: Two times a day (BID) | ORAL | Status: DC
  Administered 2021-03-20: 237 mL via ORAL

## 2021-03-20 MED ORDER — POTASSIUM CHLORIDE CRYS ER 20 MEQ PO TBCR
40.0000 meq | EXTENDED_RELEASE_TABLET | Freq: Once | ORAL | Status: AC
  Administered 2021-03-20: 40 meq via ORAL
  Filled 2021-03-20: qty 2

## 2021-03-20 MED ORDER — LIP MEDEX EX OINT: TOPICAL_OINTMENT | CUTANEOUS | Status: DC | PRN

## 2021-03-20 MED ORDER — ASPIRIN EC 81 MG PO TBEC: 81.0000 mg | DELAYED_RELEASE_TABLET | Freq: Every day | ORAL | Status: DC

## 2021-03-20 NOTE — Discharge Summary (Signed)
its distal aspect. Superior cerebellar arteries patent bilaterally.  Patent P1 segments. PCAs perfused to their distal aspects without stenosis. The right posterior communicating artery is patent. The left posterior communicating artery is not visualized. Venous sinuses: As permitted by contrast timing, patent. Anatomic variants: None significant Review of the MIP images confirms the above findings IMPRESSION: 1.  No intracranial large vessel occlusion or significant stenosis. 2. Severe focal narrowing in the right V1 segment, with otherwise patent right vertebral artery. No other hemodynamically significant stenosis in the neck. Electronically Signed   By: Merilyn Baba M.D.   On: 03/19/2021 23:23   EEG adult  Result Date: 03/19/2021 Lora Havens, MD     03/19/2021  8:36 AM Patient Name: Lisa King MRN: [id number] Epilepsy Attending: Lora Havens Referring Physician/Provider: Jonnie Finner, DO Date:03/18/2021 Duration: 22.21 mins Patient history: 88 year old female with altered mental status.  EEG to evaluate for seizure Level of alertness: Awake AEDs during EEG study: None Technical aspects: This EEG study was done with scalp electrodes positioned according to the 10-20 International system of electrode placement. Electrical activity was acquired at a sampling rate of 500Hz  and reviewed with a high frequency filter of 70Hz  and a low frequency filter of 1Hz . EEG data were recorded continuously and digitally stored. Description: EEG showed continuous generalized 3 to 6 Hz theta-delta slowing. Hyperventilation and photic stimulation were not performed.   ABNORMALITY - Continuous slow, generalized IMPRESSION: This study is suggestive of moderate diffuse encephalopathy, nonspecific etiology. No seizures or epileptiform discharges were seen throughout the recording. Lora Havens   ECHOCARDIOGRAM COMPLETE  Result Date: 03/19/2021    ECHOCARDIOGRAM REPORT   Patient Name:   Lisa King Date of Exam: 03/19/2021 Medical Rec #:  [id number]     Height:       61.0 in Accession  #:    [id number]    Weight:       146.8 lb Date of Birth:  03/02/1931     BSA:          1.656 m Patient Age:    67 years      BP:           94/52 mmHg Patient Gender: F             HR:           86 bpm. Exam Location:  Inpatient Procedure: 2D Echo, Cardiac Doppler and Color Doppler Indications:    STROKE  History:        Patient has no prior history of Echocardiogram examinations.                 COPD; Risk Factors:Hypertension and Dyslipidemia.  Sonographer:    Beryle Beams Referring Phys: [id number] [place]  1. Narrow LVOT with turbulent flow. No significant obstruction at rest. . Left ventricular ejection fraction, by estimation, is >75%. The left ventricle has hyperdynamic function. The left ventricle has no regional wall motion abnormalities. There is mild concentric left ventricular hypertrophy. Left ventricular diastolic parameters are consistent with Grade I diastolic dysfunction (impaired relaxation). Elevated left atrial pressure.  2. Right ventricular systolic function is normal. The right ventricular size is normal.  3. Left atrial size was moderately dilated.  4. Trivial mitral valve regurgitation. Severe mitral annular calcification.  5. The aortic valve is tricuspid. Aortic valve regurgitation is not visualized. Aortic valve sclerosis/calcification is present, without any evidence of aortic stenosis.  6. The inferior vena cava  Physician Discharge Summary  [name] HEN:[id number] DOB: 1931/03/08 DOA: 03/18/2021  PCP: Mast, Man X, NP  Admit date: 03/18/2021 Discharge date: 03/20/2021  Admitted From: ALF Disposition: ALF  Discharge Condition:Stable CODE STATUS: DNR Diet recommendation: Heart Healthy Brief/Interim Summary: Patient is a 88 year old female with history of hypertension, hyperlipidemia, COPD, neuropathy who presented from ALF with complaints of altered mental status.  She was in her normal state of health until the day before admission.  Staff at ALF found her confused, nauseous.  On presentation, she was hemodynamically stable.  Lab works were fine.  CT head showed age-indeterminate right corona radiator infarct, small vessel changes, chronic infarcts, generalized atrophy.  Could not get MRI due to presence of stapedes implant.  Neurology were consulted.  Repeat CT head did not show any acute findings.  Neurology did not recommend any further work-up.  Her mental status has significantly improved today and she is currently alert and oriented.  PT did not recommend any follow-up.  She is medically stable for discharge back to ALF today  Following problems were addressed during her hospitalization:  Altered mental status: Unclear etiology.  Usually alert and oriented.  Lives at ALF.  EEG did not show any seizure activity.  UA negative for UTI.  Ammonia level normal.  Could not get MRI of the brain due to presence of stapedes implant.  CT head on presentation showed age-indeterminate right corona radiator infarct, small vessel changes, chronic infarcts, generalized atrophy. Neurologic consulted.   CT head without any acute findings Neurology did not recommend any further work-up. LDL of 107.  Hemoglobin A1C of 6. Normal vitamin B12, iron level,UDS. Her mental status has improved.  She is currently alert and oriented. PT did not recommend follow-up  history of hypertension: Blood pressure  stable.  Continue  home medications   History of COPD: Currently not on exacerbation.  Continue bronchodilators as needed.   History of peripheral neuropathy: Takes pregabalin.       Discharge Diagnoses:  Active Problems:   COPD (chronic obstructive pulmonary disease) with chronic bronchitis (HCC)   HTN (hypertension)   Altered mental status   CVA (cerebral vascular accident) [facility])    Discharge Instructions  Discharge Instructions     Diet - low sodium heart healthy   Complete by: As directed    Discharge instructions   Complete by: As directed    1)Follow up with your PCP in a week 2)Do a CBC and Bmp tests in a week   Increase activity slowly   Complete by: As directed       Allergies as of 03/20/2021       Reactions   Penicillins Anaphylaxis   Bisphosphonates Other (See Comments)   [name]   Influenza Vaccines    Unknown   Simvastatin    Unknown    Trazodone And Nefazodone    Felt her throat was closing up/kept her awake   Sulfonamide Derivatives Rash, Other (See Comments)   blisters        Medication List     TAKE these medications    budesonide-formoterol 80-4.5 MCG/ACT inhaler Commonly known as: SYMBICORT Inhale 1 puff into the lungs daily.   calcium carbonate 750 MG chewable tablet Commonly known as: TUMS EX Chew 750 mg by mouth daily.   cholecalciferol 1000 units tablet Commonly known as: VITAMIN D Take 1,000 Units by mouth daily.   cyclobenzaprine 5 MG tablet Commonly known as: FLEXERIL Take 2.5 mg by mouth at  its distal aspect. Superior cerebellar arteries patent bilaterally.  Patent P1 segments. PCAs perfused to their distal aspects without stenosis. The right posterior communicating artery is patent. The left posterior communicating artery is not visualized. Venous sinuses: As permitted by contrast timing, patent. Anatomic variants: None significant Review of the MIP images confirms the above findings IMPRESSION: 1.  No intracranial large vessel occlusion or significant stenosis. 2. Severe focal narrowing in the right V1 segment, with otherwise patent right vertebral artery. No other hemodynamically significant stenosis in the neck. Electronically Signed   By: Merilyn Baba M.D.   On: 03/19/2021 23:23   EEG adult  Result Date: 03/19/2021 Lora Havens, MD     03/19/2021  8:36 AM Patient Name: Lisa King MRN: [id number] Epilepsy Attending: Lora Havens Referring Physician/Provider: Jonnie Finner, DO Date:03/18/2021 Duration: 22.21 mins Patient history: 88 year old female with altered mental status.  EEG to evaluate for seizure Level of alertness: Awake AEDs during EEG study: None Technical aspects: This EEG study was done with scalp electrodes positioned according to the 10-20 International system of electrode placement. Electrical activity was acquired at a sampling rate of 500Hz  and reviewed with a high frequency filter of 70Hz  and a low frequency filter of 1Hz . EEG data were recorded continuously and digitally stored. Description: EEG showed continuous generalized 3 to 6 Hz theta-delta slowing. Hyperventilation and photic stimulation were not performed.   ABNORMALITY - Continuous slow, generalized IMPRESSION: This study is suggestive of moderate diffuse encephalopathy, nonspecific etiology. No seizures or epileptiform discharges were seen throughout the recording. Lora Havens   ECHOCARDIOGRAM COMPLETE  Result Date: 03/19/2021    ECHOCARDIOGRAM REPORT   Patient Name:   Lisa King Date of Exam: 03/19/2021 Medical Rec #:  [id number]     Height:       61.0 in Accession  #:    [id number]    Weight:       146.8 lb Date of Birth:  03/02/1931     BSA:          1.656 m Patient Age:    67 years      BP:           94/52 mmHg Patient Gender: F             HR:           86 bpm. Exam Location:  Inpatient Procedure: 2D Echo, Cardiac Doppler and Color Doppler Indications:    STROKE  History:        Patient has no prior history of Echocardiogram examinations.                 COPD; Risk Factors:Hypertension and Dyslipidemia.  Sonographer:    Beryle Beams Referring Phys: [id number] [place]  1. Narrow LVOT with turbulent flow. No significant obstruction at rest. . Left ventricular ejection fraction, by estimation, is >75%. The left ventricle has hyperdynamic function. The left ventricle has no regional wall motion abnormalities. There is mild concentric left ventricular hypertrophy. Left ventricular diastolic parameters are consistent with Grade I diastolic dysfunction (impaired relaxation). Elevated left atrial pressure.  2. Right ventricular systolic function is normal. The right ventricular size is normal.  3. Left atrial size was moderately dilated.  4. Trivial mitral valve regurgitation. Severe mitral annular calcification.  5. The aortic valve is tricuspid. Aortic valve regurgitation is not visualized. Aortic valve sclerosis/calcification is present, without any evidence of aortic stenosis.  6. The inferior vena cava  Physician Discharge Summary  [name] HEN:[id number] DOB: 1931/03/08 DOA: 03/18/2021  PCP: Mast, Man X, NP  Admit date: 03/18/2021 Discharge date: 03/20/2021  Admitted From: ALF Disposition: ALF  Discharge Condition:Stable CODE STATUS: DNR Diet recommendation: Heart Healthy Brief/Interim Summary: Patient is a 88 year old female with history of hypertension, hyperlipidemia, COPD, neuropathy who presented from ALF with complaints of altered mental status.  She was in her normal state of health until the day before admission.  Staff at ALF found her confused, nauseous.  On presentation, she was hemodynamically stable.  Lab works were fine.  CT head showed age-indeterminate right corona radiator infarct, small vessel changes, chronic infarcts, generalized atrophy.  Could not get MRI due to presence of stapedes implant.  Neurology were consulted.  Repeat CT head did not show any acute findings.  Neurology did not recommend any further work-up.  Her mental status has significantly improved today and she is currently alert and oriented.  PT did not recommend any follow-up.  She is medically stable for discharge back to ALF today  Following problems were addressed during her hospitalization:  Altered mental status: Unclear etiology.  Usually alert and oriented.  Lives at ALF.  EEG did not show any seizure activity.  UA negative for UTI.  Ammonia level normal.  Could not get MRI of the brain due to presence of stapedes implant.  CT head on presentation showed age-indeterminate right corona radiator infarct, small vessel changes, chronic infarcts, generalized atrophy. Neurologic consulted.   CT head without any acute findings Neurology did not recommend any further work-up. LDL of 107.  Hemoglobin A1C of 6. Normal vitamin B12, iron level,UDS. Her mental status has improved.  She is currently alert and oriented. PT did not recommend follow-up  history of hypertension: Blood pressure  stable.  Continue  home medications   History of COPD: Currently not on exacerbation.  Continue bronchodilators as needed.   History of peripheral neuropathy: Takes pregabalin.       Discharge Diagnoses:  Active Problems:   COPD (chronic obstructive pulmonary disease) with chronic bronchitis (HCC)   HTN (hypertension)   Altered mental status   CVA (cerebral vascular accident) [facility])    Discharge Instructions  Discharge Instructions     Diet - low sodium heart healthy   Complete by: As directed    Discharge instructions   Complete by: As directed    1)Follow up with your PCP in a week 2)Do a CBC and Bmp tests in a week   Increase activity slowly   Complete by: As directed       Allergies as of 03/20/2021       Reactions   Penicillins Anaphylaxis   Bisphosphonates Other (See Comments)   [name]   Influenza Vaccines    Unknown   Simvastatin    Unknown    Trazodone And Nefazodone    Felt her throat was closing up/kept her awake   Sulfonamide Derivatives Rash, Other (See Comments)   blisters        Medication List     TAKE these medications    budesonide-formoterol 80-4.5 MCG/ACT inhaler Commonly known as: SYMBICORT Inhale 1 puff into the lungs daily.   calcium carbonate 750 MG chewable tablet Commonly known as: TUMS EX Chew 750 mg by mouth daily.   cholecalciferol 1000 units tablet Commonly known as: VITAMIN D Take 1,000 Units by mouth daily.   cyclobenzaprine 5 MG tablet Commonly known as: FLEXERIL Take 2.5 mg by mouth at  its distal aspect. Superior cerebellar arteries patent bilaterally.  Patent P1 segments. PCAs perfused to their distal aspects without stenosis. The right posterior communicating artery is patent. The left posterior communicating artery is not visualized. Venous sinuses: As permitted by contrast timing, patent. Anatomic variants: None significant Review of the MIP images confirms the above findings IMPRESSION: 1.  No intracranial large vessel occlusion or significant stenosis. 2. Severe focal narrowing in the right V1 segment, with otherwise patent right vertebral artery. No other hemodynamically significant stenosis in the neck. Electronically Signed   By: Merilyn Baba M.D.   On: 03/19/2021 23:23   EEG adult  Result Date: 03/19/2021 Lora Havens, MD     03/19/2021  8:36 AM Patient Name: Lisa King MRN: [id number] Epilepsy Attending: Lora Havens Referring Physician/Provider: Jonnie Finner, DO Date:03/18/2021 Duration: 22.21 mins Patient history: 88 year old female with altered mental status.  EEG to evaluate for seizure Level of alertness: Awake AEDs during EEG study: None Technical aspects: This EEG study was done with scalp electrodes positioned according to the 10-20 International system of electrode placement. Electrical activity was acquired at a sampling rate of 500Hz  and reviewed with a high frequency filter of 70Hz  and a low frequency filter of 1Hz . EEG data were recorded continuously and digitally stored. Description: EEG showed continuous generalized 3 to 6 Hz theta-delta slowing. Hyperventilation and photic stimulation were not performed.   ABNORMALITY - Continuous slow, generalized IMPRESSION: This study is suggestive of moderate diffuse encephalopathy, nonspecific etiology. No seizures or epileptiform discharges were seen throughout the recording. Lora Havens   ECHOCARDIOGRAM COMPLETE  Result Date: 03/19/2021    ECHOCARDIOGRAM REPORT   Patient Name:   Lisa King Date of Exam: 03/19/2021 Medical Rec #:  [id number]     Height:       61.0 in Accession  #:    [id number]    Weight:       146.8 lb Date of Birth:  03/02/1931     BSA:          1.656 m Patient Age:    67 years      BP:           94/52 mmHg Patient Gender: F             HR:           86 bpm. Exam Location:  Inpatient Procedure: 2D Echo, Cardiac Doppler and Color Doppler Indications:    STROKE  History:        Patient has no prior history of Echocardiogram examinations.                 COPD; Risk Factors:Hypertension and Dyslipidemia.  Sonographer:    Beryle Beams Referring Phys: [id number] [place]  1. Narrow LVOT with turbulent flow. No significant obstruction at rest. . Left ventricular ejection fraction, by estimation, is >75%. The left ventricle has hyperdynamic function. The left ventricle has no regional wall motion abnormalities. There is mild concentric left ventricular hypertrophy. Left ventricular diastolic parameters are consistent with Grade I diastolic dysfunction (impaired relaxation). Elevated left atrial pressure.  2. Right ventricular systolic function is normal. The right ventricular size is normal.  3. Left atrial size was moderately dilated.  4. Trivial mitral valve regurgitation. Severe mitral annular calcification.  5. The aortic valve is tricuspid. Aortic valve regurgitation is not visualized. Aortic valve sclerosis/calcification is present, without any evidence of aortic stenosis.  6. The inferior vena cava  its distal aspect. Superior cerebellar arteries patent bilaterally.  Patent P1 segments. PCAs perfused to their distal aspects without stenosis. The right posterior communicating artery is patent. The left posterior communicating artery is not visualized. Venous sinuses: As permitted by contrast timing, patent. Anatomic variants: None significant Review of the MIP images confirms the above findings IMPRESSION: 1.  No intracranial large vessel occlusion or significant stenosis. 2. Severe focal narrowing in the right V1 segment, with otherwise patent right vertebral artery. No other hemodynamically significant stenosis in the neck. Electronically Signed   By: Merilyn Baba M.D.   On: 03/19/2021 23:23   EEG adult  Result Date: 03/19/2021 Lora Havens, MD     03/19/2021  8:36 AM Patient Name: Lisa King MRN: [id number] Epilepsy Attending: Lora Havens Referring Physician/Provider: Jonnie Finner, DO Date:03/18/2021 Duration: 22.21 mins Patient history: 88 year old female with altered mental status.  EEG to evaluate for seizure Level of alertness: Awake AEDs during EEG study: None Technical aspects: This EEG study was done with scalp electrodes positioned according to the 10-20 International system of electrode placement. Electrical activity was acquired at a sampling rate of 500Hz  and reviewed with a high frequency filter of 70Hz  and a low frequency filter of 1Hz . EEG data were recorded continuously and digitally stored. Description: EEG showed continuous generalized 3 to 6 Hz theta-delta slowing. Hyperventilation and photic stimulation were not performed.   ABNORMALITY - Continuous slow, generalized IMPRESSION: This study is suggestive of moderate diffuse encephalopathy, nonspecific etiology. No seizures or epileptiform discharges were seen throughout the recording. Lora Havens   ECHOCARDIOGRAM COMPLETE  Result Date: 03/19/2021    ECHOCARDIOGRAM REPORT   Patient Name:   Lisa King Date of Exam: 03/19/2021 Medical Rec #:  [id number]     Height:       61.0 in Accession  #:    [id number]    Weight:       146.8 lb Date of Birth:  03/02/1931     BSA:          1.656 m Patient Age:    67 years      BP:           94/52 mmHg Patient Gender: F             HR:           86 bpm. Exam Location:  Inpatient Procedure: 2D Echo, Cardiac Doppler and Color Doppler Indications:    STROKE  History:        Patient has no prior history of Echocardiogram examinations.                 COPD; Risk Factors:Hypertension and Dyslipidemia.  Sonographer:    Beryle Beams Referring Phys: [id number] [place]  1. Narrow LVOT with turbulent flow. No significant obstruction at rest. . Left ventricular ejection fraction, by estimation, is >75%. The left ventricle has hyperdynamic function. The left ventricle has no regional wall motion abnormalities. There is mild concentric left ventricular hypertrophy. Left ventricular diastolic parameters are consistent with Grade I diastolic dysfunction (impaired relaxation). Elevated left atrial pressure.  2. Right ventricular systolic function is normal. The right ventricular size is normal.  3. Left atrial size was moderately dilated.  4. Trivial mitral valve regurgitation. Severe mitral annular calcification.  5. The aortic valve is tricuspid. Aortic valve regurgitation is not visualized. Aortic valve sclerosis/calcification is present, without any evidence of aortic stenosis.  6. The inferior vena cava  Physician Discharge Summary  [name] HEN:[id number] DOB: 1931/03/08 DOA: 03/18/2021  PCP: Mast, Man X, NP  Admit date: 03/18/2021 Discharge date: 03/20/2021  Admitted From: ALF Disposition: ALF  Discharge Condition:Stable CODE STATUS: DNR Diet recommendation: Heart Healthy Brief/Interim Summary: Patient is a 88 year old female with history of hypertension, hyperlipidemia, COPD, neuropathy who presented from ALF with complaints of altered mental status.  She was in her normal state of health until the day before admission.  Staff at ALF found her confused, nauseous.  On presentation, she was hemodynamically stable.  Lab works were fine.  CT head showed age-indeterminate right corona radiator infarct, small vessel changes, chronic infarcts, generalized atrophy.  Could not get MRI due to presence of stapedes implant.  Neurology were consulted.  Repeat CT head did not show any acute findings.  Neurology did not recommend any further work-up.  Her mental status has significantly improved today and she is currently alert and oriented.  PT did not recommend any follow-up.  She is medically stable for discharge back to ALF today  Following problems were addressed during her hospitalization:  Altered mental status: Unclear etiology.  Usually alert and oriented.  Lives at ALF.  EEG did not show any seizure activity.  UA negative for UTI.  Ammonia level normal.  Could not get MRI of the brain due to presence of stapedes implant.  CT head on presentation showed age-indeterminate right corona radiator infarct, small vessel changes, chronic infarcts, generalized atrophy. Neurologic consulted.   CT head without any acute findings Neurology did not recommend any further work-up. LDL of 107.  Hemoglobin A1C of 6. Normal vitamin B12, iron level,UDS. Her mental status has improved.  She is currently alert and oriented. PT did not recommend follow-up  history of hypertension: Blood pressure  stable.  Continue  home medications   History of COPD: Currently not on exacerbation.  Continue bronchodilators as needed.   History of peripheral neuropathy: Takes pregabalin.       Discharge Diagnoses:  Active Problems:   COPD (chronic obstructive pulmonary disease) with chronic bronchitis (HCC)   HTN (hypertension)   Altered mental status   CVA (cerebral vascular accident) [facility])    Discharge Instructions  Discharge Instructions     Diet - low sodium heart healthy   Complete by: As directed    Discharge instructions   Complete by: As directed    1)Follow up with your PCP in a week 2)Do a CBC and Bmp tests in a week   Increase activity slowly   Complete by: As directed       Allergies as of 03/20/2021       Reactions   Penicillins Anaphylaxis   Bisphosphonates Other (See Comments)   [name]   Influenza Vaccines    Unknown   Simvastatin    Unknown    Trazodone And Nefazodone    Felt her throat was closing up/kept her awake   Sulfonamide Derivatives Rash, Other (See Comments)   blisters        Medication List     TAKE these medications    budesonide-formoterol 80-4.5 MCG/ACT inhaler Commonly known as: SYMBICORT Inhale 1 puff into the lungs daily.   calcium carbonate 750 MG chewable tablet Commonly known as: TUMS EX Chew 750 mg by mouth daily.   cholecalciferol 1000 units tablet Commonly known as: VITAMIN D Take 1,000 Units by mouth daily.   cyclobenzaprine 5 MG tablet Commonly known as: FLEXERIL Take 2.5 mg by mouth at  Physician Discharge Summary  [name] HEN:[id number] DOB: 1931/03/08 DOA: 03/18/2021  PCP: Mast, Man X, NP  Admit date: 03/18/2021 Discharge date: 03/20/2021  Admitted From: ALF Disposition: ALF  Discharge Condition:Stable CODE STATUS: DNR Diet recommendation: Heart Healthy Brief/Interim Summary: Patient is a 88 year old female with history of hypertension, hyperlipidemia, COPD, neuropathy who presented from ALF with complaints of altered mental status.  She was in her normal state of health until the day before admission.  Staff at ALF found her confused, nauseous.  On presentation, she was hemodynamically stable.  Lab works were fine.  CT head showed age-indeterminate right corona radiator infarct, small vessel changes, chronic infarcts, generalized atrophy.  Could not get MRI due to presence of stapedes implant.  Neurology were consulted.  Repeat CT head did not show any acute findings.  Neurology did not recommend any further work-up.  Her mental status has significantly improved today and she is currently alert and oriented.  PT did not recommend any follow-up.  She is medically stable for discharge back to ALF today  Following problems were addressed during her hospitalization:  Altered mental status: Unclear etiology.  Usually alert and oriented.  Lives at ALF.  EEG did not show any seizure activity.  UA negative for UTI.  Ammonia level normal.  Could not get MRI of the brain due to presence of stapedes implant.  CT head on presentation showed age-indeterminate right corona radiator infarct, small vessel changes, chronic infarcts, generalized atrophy. Neurologic consulted.   CT head without any acute findings Neurology did not recommend any further work-up. LDL of 107.  Hemoglobin A1C of 6. Normal vitamin B12, iron level,UDS. Her mental status has improved.  She is currently alert and oriented. PT did not recommend follow-up  history of hypertension: Blood pressure  stable.  Continue  home medications   History of COPD: Currently not on exacerbation.  Continue bronchodilators as needed.   History of peripheral neuropathy: Takes pregabalin.       Discharge Diagnoses:  Active Problems:   COPD (chronic obstructive pulmonary disease) with chronic bronchitis (HCC)   HTN (hypertension)   Altered mental status   CVA (cerebral vascular accident) [facility])    Discharge Instructions  Discharge Instructions     Diet - low sodium heart healthy   Complete by: As directed    Discharge instructions   Complete by: As directed    1)Follow up with your PCP in a week 2)Do a CBC and Bmp tests in a week   Increase activity slowly   Complete by: As directed       Allergies as of 03/20/2021       Reactions   Penicillins Anaphylaxis   Bisphosphonates Other (See Comments)   [name]   Influenza Vaccines    Unknown   Simvastatin    Unknown    Trazodone And Nefazodone    Felt her throat was closing up/kept her awake   Sulfonamide Derivatives Rash, Other (See Comments)   blisters        Medication List     TAKE these medications    budesonide-formoterol 80-4.5 MCG/ACT inhaler Commonly known as: SYMBICORT Inhale 1 puff into the lungs daily.   calcium carbonate 750 MG chewable tablet Commonly known as: TUMS EX Chew 750 mg by mouth daily.   cholecalciferol 1000 units tablet Commonly known as: VITAMIN D Take 1,000 Units by mouth daily.   cyclobenzaprine 5 MG tablet Commonly known as: FLEXERIL Take 2.5 mg by mouth at  its distal aspect. Superior cerebellar arteries patent bilaterally.  Patent P1 segments. PCAs perfused to their distal aspects without stenosis. The right posterior communicating artery is patent. The left posterior communicating artery is not visualized. Venous sinuses: As permitted by contrast timing, patent. Anatomic variants: None significant Review of the MIP images confirms the above findings IMPRESSION: 1.  No intracranial large vessel occlusion or significant stenosis. 2. Severe focal narrowing in the right V1 segment, with otherwise patent right vertebral artery. No other hemodynamically significant stenosis in the neck. Electronically Signed   By: Merilyn Baba M.D.   On: 03/19/2021 23:23   EEG adult  Result Date: 03/19/2021 Lora Havens, MD     03/19/2021  8:36 AM Patient Name: Lisa King MRN: [id number] Epilepsy Attending: Lora Havens Referring Physician/Provider: Jonnie Finner, DO Date:03/18/2021 Duration: 22.21 mins Patient history: 88 year old female with altered mental status.  EEG to evaluate for seizure Level of alertness: Awake AEDs during EEG study: None Technical aspects: This EEG study was done with scalp electrodes positioned according to the 10-20 International system of electrode placement. Electrical activity was acquired at a sampling rate of 500Hz  and reviewed with a high frequency filter of 70Hz  and a low frequency filter of 1Hz . EEG data were recorded continuously and digitally stored. Description: EEG showed continuous generalized 3 to 6 Hz theta-delta slowing. Hyperventilation and photic stimulation were not performed.   ABNORMALITY - Continuous slow, generalized IMPRESSION: This study is suggestive of moderate diffuse encephalopathy, nonspecific etiology. No seizures or epileptiform discharges were seen throughout the recording. Lora Havens   ECHOCARDIOGRAM COMPLETE  Result Date: 03/19/2021    ECHOCARDIOGRAM REPORT   Patient Name:   Lisa King Date of Exam: 03/19/2021 Medical Rec #:  [id number]     Height:       61.0 in Accession  #:    [id number]    Weight:       146.8 lb Date of Birth:  03/02/1931     BSA:          1.656 m Patient Age:    67 years      BP:           94/52 mmHg Patient Gender: F             HR:           86 bpm. Exam Location:  Inpatient Procedure: 2D Echo, Cardiac Doppler and Color Doppler Indications:    STROKE  History:        Patient has no prior history of Echocardiogram examinations.                 COPD; Risk Factors:Hypertension and Dyslipidemia.  Sonographer:    Beryle Beams Referring Phys: [id number] [place]  1. Narrow LVOT with turbulent flow. No significant obstruction at rest. . Left ventricular ejection fraction, by estimation, is >75%. The left ventricle has hyperdynamic function. The left ventricle has no regional wall motion abnormalities. There is mild concentric left ventricular hypertrophy. Left ventricular diastolic parameters are consistent with Grade I diastolic dysfunction (impaired relaxation). Elevated left atrial pressure.  2. Right ventricular systolic function is normal. The right ventricular size is normal.  3. Left atrial size was moderately dilated.  4. Trivial mitral valve regurgitation. Severe mitral annular calcification.  5. The aortic valve is tricuspid. Aortic valve regurgitation is not visualized. Aortic valve sclerosis/calcification is present, without any evidence of aortic stenosis.  6. The inferior vena cava  its distal aspect. Superior cerebellar arteries patent bilaterally.  Patent P1 segments. PCAs perfused to their distal aspects without stenosis. The right posterior communicating artery is patent. The left posterior communicating artery is not visualized. Venous sinuses: As permitted by contrast timing, patent. Anatomic variants: None significant Review of the MIP images confirms the above findings IMPRESSION: 1.  No intracranial large vessel occlusion or significant stenosis. 2. Severe focal narrowing in the right V1 segment, with otherwise patent right vertebral artery. No other hemodynamically significant stenosis in the neck. Electronically Signed   By: Merilyn Baba M.D.   On: 03/19/2021 23:23   EEG adult  Result Date: 03/19/2021 Lora Havens, MD     03/19/2021  8:36 AM Patient Name: Lisa King MRN: [id number] Epilepsy Attending: Lora Havens Referring Physician/Provider: Jonnie Finner, DO Date:03/18/2021 Duration: 22.21 mins Patient history: 88 year old female with altered mental status.  EEG to evaluate for seizure Level of alertness: Awake AEDs during EEG study: None Technical aspects: This EEG study was done with scalp electrodes positioned according to the 10-20 International system of electrode placement. Electrical activity was acquired at a sampling rate of 500Hz  and reviewed with a high frequency filter of 70Hz  and a low frequency filter of 1Hz . EEG data were recorded continuously and digitally stored. Description: EEG showed continuous generalized 3 to 6 Hz theta-delta slowing. Hyperventilation and photic stimulation were not performed.   ABNORMALITY - Continuous slow, generalized IMPRESSION: This study is suggestive of moderate diffuse encephalopathy, nonspecific etiology. No seizures or epileptiform discharges were seen throughout the recording. Lora Havens   ECHOCARDIOGRAM COMPLETE  Result Date: 03/19/2021    ECHOCARDIOGRAM REPORT   Patient Name:   Lisa King Date of Exam: 03/19/2021 Medical Rec #:  [id number]     Height:       61.0 in Accession  #:    [id number]    Weight:       146.8 lb Date of Birth:  03/02/1931     BSA:          1.656 m Patient Age:    67 years      BP:           94/52 mmHg Patient Gender: F             HR:           86 bpm. Exam Location:  Inpatient Procedure: 2D Echo, Cardiac Doppler and Color Doppler Indications:    STROKE  History:        Patient has no prior history of Echocardiogram examinations.                 COPD; Risk Factors:Hypertension and Dyslipidemia.  Sonographer:    Beryle Beams Referring Phys: [id number] [place]  1. Narrow LVOT with turbulent flow. No significant obstruction at rest. . Left ventricular ejection fraction, by estimation, is >75%. The left ventricle has hyperdynamic function. The left ventricle has no regional wall motion abnormalities. There is mild concentric left ventricular hypertrophy. Left ventricular diastolic parameters are consistent with Grade I diastolic dysfunction (impaired relaxation). Elevated left atrial pressure.  2. Right ventricular systolic function is normal. The right ventricular size is normal.  3. Left atrial size was moderately dilated.  4. Trivial mitral valve regurgitation. Severe mitral annular calcification.  5. The aortic valve is tricuspid. Aortic valve regurgitation is not visualized. Aortic valve sclerosis/calcification is present, without any evidence of aortic stenosis.  6. The inferior vena cava

## 2021-03-20 NOTE — Progress Notes (Signed)
Physical Therapy Treatment Patient Details Name: Lisa King MRN: [id number] DOB: 1931/12/09 Today's Date: 03/20/2021   History of Present Illness Patient is 88 y.o. female presented from [facility] due to AMS. CT head showed age-indeterminate right corona radiator infarct, small vessel changes, chronic infarcts, generalized atrophy.  Could not get MRI due to presence of stapedes implant. PMH significant for anxiety, asthma, COPD, GERD, fibro, osteoporosis, OA, HTN, HOH.    PT Comments    Pt is progressing toward acute PT goals this session with progression of ambulation. Pt ambulated ~18ft with supervision-MOD I and use of RW, no LOB observed. Pt with much improved mobility level compared to initial PT eval, updated recommendation to return to ALF upon d/c. Pt will benefit from continued skilled PT during stay to increase independence, improve activity tolerance, and maximize safety with mobility.     Recommendations for follow up therapy are one component of a multi-disciplinary discharge planning process, led by the attending physician.  Recommendations may be updated based on patient status, additional functional criteria and insurance authorization.  Follow Up Recommendations  No PT follow up     Assistance Recommended at Discharge Intermittent Supervision/Assistance  Patient can return home with the following Assistance with cooking/housework;Help with stairs or ramp for entrance;Assist for transportation;Direct supervision/assist for financial management;Direct supervision/assist for medications management   Equipment Recommendations  None recommended by PT (Pt reports she own rollator)    Recommendations for Other Services       Precautions / Restrictions Precautions Precautions: Fall Precaution Comments: monitor BP Restrictions Weight Bearing Restrictions: No     Mobility  Bed Mobility Overal bed mobility: Modified Independent       Supine to sit: Modified  independent (Device/Increase time)     General bed mobility comments: HOB elevated    Transfers Overall transfer level: Needs assistance Equipment used: Rolling walker (2 wheels) Transfers: Sit to/from Stand Sit to Stand: Min guard           General transfer comment: x2 from EOB and raised toilet seat. MIN guard for safety and cues for safe hand placement with use of RW.    Ambulation/Gait Ambulation/Gait assistance: Supervision, Modified independent (Device/Increase time) Gait Distance (Feet): 150 Feet Assistive device: Rolling walker (2 wheels) Gait Pattern/deviations: Step-through pattern, Decreased stride length Gait velocity: decr     General Gait Details: no overt LOB observed, increased SOB/fatigue following ambulation x1 brief standing rest break.   Stairs             Wheelchair Mobility    Modified Rankin (Stroke Patients Only)       Balance Overall balance assessment: Needs assistance Sitting-balance support: Feet supported Sitting balance-Leahy Scale: Good     Standing balance support: Bilateral upper extremity supported, During functional activity Standing balance-Leahy Scale: Fair Standing balance comment: able to stand at sink for hand hygiene without UE support                            Cognition Arousal/Alertness: Awake/alert Behavior During Therapy: WFL for tasks assessed/performed, Anxious Overall Cognitive Status: Within Functional Limits for tasks assessed                                 General Comments: Pt with improved cognition and mood per visitors who were present during end of session. Disoriented to current yr, able to verbalize correct  place, name/DOB, and month.        Exercises      General Comments        Pertinent Vitals/Pain Pain Assessment Pain Assessment: No/denies pain    Home Living                          Prior Function            PT Goals (current goals can  now be found in the care plan section) Acute Rehab PT Goals Patient Stated Goal: none stated by pt, son for pt to regain mobility PT Goal Formulation: With family Time For Goal Achievement: 04/02/21 Potential to Achieve Goals: Good Progress towards PT goals: Progressing toward goals    Frequency    Min 3X/week      PT Plan Discharge plan needs to be updated    Co-evaluation              AM-PAC PT "6 Clicks" Mobility   Outcome Measure  Help needed turning from your back to your side while in a flat bed without using bedrails?: None Help needed moving from lying on your back to sitting on the side of a flat bed without using bedrails?: None Help needed moving to and from a bed to a chair (including a wheelchair)?: A Little Help needed standing up from a chair using your arms (e.g., wheelchair or bedside chair)?: A Little Help needed to walk in hospital room?: A Little Help needed climbing 3-5 steps with a railing? : A Little 6 Click Score: 20    End of Session Equipment Utilized During Treatment: Gait belt Activity Tolerance: Patient tolerated treatment well Patient left: in chair;with call bell/phone within reach;with chair alarm set;with family/visitor present Nurse Communication: Mobility status PT Visit Diagnosis: Unsteadiness on feet (R26.81);Muscle weakness (generalized) (M62.81)     Time: 4540-9811 PT Time Calculation (min) (ACUTE ONLY): 24 min  Charges:  $Therapeutic Activity: 23-37 mins                     Lyman Speller PT, DPT  Acute Rehabilitation Services  Office [phone]   03/20/2021, 1:19 PM

## 2021-03-20 NOTE — Plan of Care (Signed)
Pt cannot remember why she is here, however she is alert and can state where she lives, her age, and knows she is in the hospital. She has moderate strength in both legs against gravity and has equal sensation. There were no acute events overnight and pt seemed to have rested well. Problem: Clinical Measurements: Goal: Ability to maintain clinical measurements within normal limits will improve Outcome: Progressing Goal: Will remain free from infection Outcome: Progressing Goal: Diagnostic test results will improve Outcome: Progressing Goal: Respiratory complications will improve Outcome: Progressing Goal: Cardiovascular complication will be avoided Outcome: Progressing   Problem: Nutrition: Goal: Adequate nutrition will be maintained Outcome: Progressing   Problem: Elimination: Goal: Will not experience complications related to bowel motility Outcome: Progressing Goal: Will not experience complications related to urinary retention Outcome: Progressing   Problem: Skin Integrity: Goal: Risk for impaired skin integrity will decrease Outcome: Progressing

## 2021-03-20 NOTE — Progress Notes (Signed)
Pt discharged back to [facility] by son. Discharge instructions and medication education provided to pt and son at bedside.

## 2021-03-20 NOTE — TOC Transition Note (Signed)
Transition of Care [facility]) - CM/SW Discharge Note   Patient Details  Name: Lisa King MRN: [id number] Date of Birth: Oct 21, 1931  Transition of Care [facility]) CM/SW Contact:  Trish Mage, LCSW Phone Number: 03/20/2021, 2:50 PM   Clinical Narrative:   Patient who is stable for d/c will return to [facility]. Family will provide transportation at 3:30 PM. FL2 and D/C summary posted on HUB for [facility] at [facility].  Nursing, please call report to [phone]. TOC sign off.    Final next level of care: Home/Self Care Barriers to Discharge: No Barriers Identified   Patient Goals and CMS Choice Patient states their goals for this hospitalization and ongoing recovery are:: return to [facility]      Discharge Placement                Patient to be transferred to facility by: Duffy Bruce Sr.,Christopher Relative [phone]      Discharge Plan and Services                                     Social Determinants of Health (SDOH) Interventions     Readmission Risk Interventions No flowsheet data found.

## 2021-03-20 NOTE — Consult Note (Addendum)
Neurology Consult H&P  Lisa King MR# [id number] 03/20/2021   CC: alteration in speech and confusion  History is obtained from: patient and chart.  HPI: Lisa King is a 88 y.o. female PMHx as reviewed below presenting with altered mental status. History is from chart review. Patient normal until the morning of 03/18/2021 living in assisted living facility. Staff served breakfast, found her incoherent and only to say "okay okay" and they were concerned for possible infection, so they called for EMS. She has not had any medication changes. He is not aware of her having any episodes like this in the past.   She denies headache, changes in vis/hearing, CP, SOB, dizziness, LOC  ROS: A complete ROS was performed and is negative except as noted in the HPI.   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     Fibromyalgia    GERD 12/19/2006   Qualifier: Diagnosis of  By: Julien Girt CMA, Marliss Czar  04/13/16 TSH 1.20, Na 141, K 4.2, Bun 15, creat 0.79, wbc 5.5, Hgb 11.5, plt 283    GERD (gastroesophageal reflux disease)    Hammer toe of second toe of left foot 04/08/2016   Left 2nd   Hearing loss    Hemorrhoids    Hypercholesterolemia    Hypertension    Osteoarthritis 12/19/2006   Qualifier: Diagnosis of  By: Julien Girt CMA, Leigh     Osteoporosis      Family History  Problem Relation Age of Onset   Heart  disease Father    COPD Father    Hypertension Mother    Arthritis Mother     Social History:  reports that she quit smoking about 11 years ago. Her smoking use included cigarettes. She has never used smokeless tobacco. She reports current alcohol use. She reports that she does not use drugs.   Prior to Admission medications   Medication Sig Start Date End Date Taking? Authorizing Provider  budesonide-formoterol (SYMBICORT) 80-4.5 MCG/ACT inhaler Inhale 1 puff into the lungs daily.   Yes [provider]  calcium carbonate (TUMS EX) 750 MG chewable tablet Chew 750 mg by mouth daily.    Yes [provider]  cholecalciferol (VITAMIN D) 1000 UNITS tablet Take 1,000 Units by mouth daily.   Yes [provider]  cyclobenzaprine (FLEXERIL) 5 MG tablet Take 2.5 mg by mouth at bedtime.   Yes [provider]  docusate sodium (COLACE) 100 MG capsule Take 100 mg by mouth 2 (two) times daily.   Yes [provider]  ferrous sulfate 325 (65 FE) MG tablet Take 325 mg by mouth. Once A Day on Mon, Wed, Fri   Yes [provider]  hydrocortisone cream 1 % Apply 1 application topically 2 (two) times daily as needed (wrists).  Yes [provider]  hydrocortisone valerate cream (WESTCORT) 0.2 % Apply 1 application topically 2 (two) times daily as needed (for itching). Cleanse behind ears with gentle cleanser and dry completely.   Yes [provider]  ketoconazole (NIZORAL) 2 % cream Apply 1 application topically 2 (two) times daily as needed for irritation.   Yes [provider]  metoprolol tartrate (LOPRESSOR) 25 MG tablet Take 12.5 mg by mouth 2 (two) times daily.   Yes [provider]  nitrofurantoin (MACRODANTIN) 50 MG capsule Take 50 mg by mouth at bedtime.   Yes [provider]  Olopatadine HCl 0.2 % SOLN Place 1 drop into both eyes daily.   Yes [provider]  polyethylene glycol (MIRALAX / GLYCOLAX) 17  g packet Take 17 g by mouth 2 (two) times a week. Sunday and Wednesday   Yes [provider]  pregabalin (LYRICA) 75 MG capsule Take 1 capsule (75 mg total) by mouth at bedtime. 02/04/21  Yes [place], Amy E, NP  Propylene Glycol 0.6 % SOLN Place 1 drop into both eyes 4 (four) times daily.   Yes [provider]  vitamin B-12 (CYANOCOBALAMIN) 1000 MCG tablet Take 1,000 mcg by mouth daily.   Yes [provider]    Exam: Current vital signs: BP (!) 143/60 (BP Location: Left Arm)    Pulse 78    Temp 98.9 F (37.2 C) (Oral)    Resp 15    Ht 5\' 1"  (1.549 m)    Wt 66.6 kg    SpO2 95%    BMI 27.74 kg/m   Physical Exam  Constitutional: Appears well-developed and well-nourished.  Psych: Affect appropriate to situation Eyes: No scleral injection HENT: No OP obstruction. Head: Normocephalic.  Cardiovascular: Normal rate and regular rhythm.  Respiratory: Effort normal, symmetric excursions bilaterally, no audible wheezing. GI: Soft.  No distension. There is no tenderness.  Skin: WDI  Neuro: Mental Status: Patient is awake, alert, oriented to person, place, month, year, and situation. She is edentulous and did not have her dentures in place.  Speech fluent, intact comprehension and repetition. No signs of aphasia or neglect. Visual Fields are full. Pupils are equal, round, and reactive to light. EOMI without ptosis or diploplia.  Facial sensation is symmetric to temperature Facial movement is symmetric.  Hearing is intact to voice. Uvula midline and palate elevates symmetrically. Shoulder shrug is symmetric. Tongue is midline without atrophy or fasciculations.  Tone is normal. Bulk is normal. 5/5 strength was present in all four extremities.  Sensation is symmetric to light touch and temperature in the arms and legs. Deep Tendon Reflexes: 2+ and symmetric in the biceps and patellae. Toes are downgoing bilaterally. FNF and HKS are intact bilaterally. Gait -  Deferred  I have reviewed labs in epic and the pertinent results are: TSH 0.66, B12 1,441, K 3.2  Latest Reference Range & Units 03/18/21 11:18  pO2, Ven 32.0 - 45.0 mmHg <31.0 (LL)  (LL): Data is critically low  I have reviewed the images obtained: NCT head showed no definite acute intracranial pathology. Focal hypodensity of the right corona radiata is unchanged, consistent with a small, strictly age indeterminate, although likely chronic lacunar infarction and unchanged compared to NCT head 02/16/2021.Marland Kitchen CTA head and neck showed no intracranial large vessel occlusion or significant stenosis  Assessment: Lisa King is a 88 y.o. female PMHx as noted above with episode of confusion. CT scan showed chronic lacunar infarction in the right corona radiata and  stroke to that area would cause distinct symptoms that are very much unlike her presentation. VBG did show low pO2 which may cause confusion as that would drive up the cerebral CO2. Though she does not have any recollection of what brought her in, on exam she was pleasant, appropriately interactive, clear and coherent with good judgement and insight.   Impression:  Transient encephalopathy - Resolved. Anxiety Insomnia Chronic lacunar infarction right corona radiata COPD HTN HLD   Plan: - Neurology will remain available, please call for questions.  Electronically signed by:  Lynnae Sandhoff, MD Page: [phone] 03/20/2021, 7:20 AM  If 7pm- 7am, please page neurology on call as listed in [place].

## 2021-03-20 NOTE — Progress Notes (Signed)
Speech Language Pathology Treatment: Dysphagia  °Patient Details °Name: Lisa King °MRN: [id number] °DOB: 04/15/1931 °Today's Date: 03/20/2021 °Time: 1255-1310 °SLP Time Calculation (min) (ACUTE ONLY): 15 min ° °Assessment / Plan / Recommendation °Clinical Impression ° Pt seen today to address dysphagia goals given her medical condition.  Pt found sitting up in her chair with granddaughter in room.  Dentures and hearing aids are in place . Articulation is clear and mild expressive language deficits continue resulting in pauses and word substitution due to word finding deficits.  Suspect this may be baseline given granddaughter report of grandmother being her norm currently.  ° °Pt advised she does not like her food  - placed on full liquid due to concerns for aspiration on 03/19/2021.  Today pt observed consuming 1/2 turkey sandwich and tea - Minimal prolong mastication but functional given her age (88) and no oral retention. Pt does tend to speak while eating and advised against behavior for airway protection.  No s/s of aspiration - Pt wincing with tea but denies discomfort. Minimal erythema on bilateral faucial pillars noted.    ° °Recommend advance to regular/thin consistency.  Follow up at next venue for speech/language evaluation if family/pt notes cognitive linguistic aberrance.   ° °Educated pt and her granddaughter, [name], to findings and recommendations. No acute SLP follow up needed.   °HPI HPI: 88 yo female adm to [facility] with AMS - PMH for HLD, HTN, COPD, neuropathy.   Brain imaging showed subacute corona radiata CVA.  Pt unable to have MRI °  °   °SLP Plan ° All goals met ° °  °  °Recommendations for follow up therapy are one component of a multi-disciplinary discharge planning process, led by the attending physician.  Recommendations may be updated based on patient status, additional functional criteria and insurance authorization. °  ° °Recommendations  °Diet recommendations: Regular;Thin liquid °Liquids  provided via: Cup;Straw °Medication Administration: Whole meds with liquid °Supervision: Patient able to self feed °Compensations: Slow rate;Small sips/bites °Postural Changes and/or Swallow Maneuvers: Seated upright 90 degrees;Upright 30-60 min after meal  °   °    °   ° ° ° ° Oral Care Recommendations: Oral care BID °Follow Up Recommendations: No SLP follow up °Assistance recommended at discharge: Intermittent Supervision/Assistance °SLP Visit Diagnosis: Dysphagia, unspecified (R13.10) °Plan: All goals met ° ° ° ° °  °  ° °Tammy K, MS CCC SLP °Acute Rehab Services °Office [phone] °Cell [phone] ° °,  Ann ° °03/20/2021, 2:14 PM °

## 2021-03-20 NOTE — NC FL2 (Signed)
[facility] [place] MEDICAID FL2 LEVEL OF CARE SCREENING TOOL     IDENTIFICATION  Patient Name: Lisa King Birthdate: 17-Apr-1931 Sex: female Admission Date (Current Location): 03/18/2021  [facility] and [place] Number:  Producer, television/film/video and Address:  [facility],  [address] [place], [place] [address]      Provider Number: [id number]  Attending Physician Name and Address:  Burnadette Pop, MD  Relative Name and Phone Number:  Fonda, Moncrief)   [phone]    Current Level of Care: Hospital Recommended Level of Care: Assisted Living Facility Prior Approval Number:    Date Approved/Denied:   PASRR Number:    Discharge Plan: Domiciliary (Rest home) ([facility])    Current Diagnoses: Patient Active Problem List   Diagnosis Date Noted   Altered mental status 03/18/2021   CVA (cerebral vascular accident) (HCC) 03/18/2021   Blurred vision, bilateral 08/15/2020   Peripheral neuropathy 08/06/2020   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   Chronic gastritis 02/20/2020   Diverticulosis of large intestine with hemorrhage    Hematochezia    GI bleed 02/16/2020   Systolic murmur 02/16/2020   Chronic anemia 10/16/2019   Cough 10/25/2018   Dysuria 09/12/2018   Atrophic vaginitis 06/28/2018   HTN (hypertension) 11/15/2017   Insomnia secondary to anxiety 07/21/2017   Bilateral dry eyes 02/17/2017   UTI (urinary tract infection) 09/17/2016   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   Hammer toe of second toe of left foot 04/08/2016   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   Lower gastrointestinal bleed 09/14/2013   Personal history of colonic polyps 09/14/2013   Slow transit constipation 09/14/2013   ABLA (acute blood loss anemia) 08/23/2013   Hearing loss 09/05/2008   Anxiety state 07/02/2007   HYPERCHOLESTEROLEMIA 06/19/2007   COPD (chronic obstructive pulmonary disease) with chronic bronchitis (HCC)  12/19/2006   GERD 12/19/2006   Osteoarthritis 12/19/2006   Fibromyalgia 12/19/2006   Osteoporosis 12/19/2006    Orientation RESPIRATION BLADDER Height & Weight     Self, Situation, Place  Normal Continent (intermittently incontinent) Weight: 66.6 kg Height:  5\' 1"  (154.9 cm)  BEHAVIORAL SYMPTOMS/MOOD NEUROLOGICAL BOWEL NUTRITION STATUS      Continent  (Heart Healthy)  AMBULATORY STATUS COMMUNICATION OF NEEDS Skin   Supervision Verbally Normal                       Personal Care Assistance Level of Assistance  Bathing, Feeding, Dressing Bathing Assistance: Limited assistance Feeding assistance: Independent Dressing Assistance: Limited assistance     Functional Limitations Info  Sight, Hearing, Speech Sight Info: Adequate (with glasses) Hearing Info: Adequate (with hearing aids) Speech Info: Adequate    SPECIAL CARE FACTORS FREQUENCY  Speech therapy             Speech Therapy Frequency: Outpatient evaluation      Contractures Contractures Info: Not present    Additional Factors Info  Allergies, Code Status Code Status Info: DNR Allergies Info: Penicillins, Bisphosphonates, Influenza vaccines, Simvastatin, Trazodone and Nefazodone, Sulfonamide Derivatives            Discharge Medications: budesonide-formoterol 80-4.5 MCG/ACT inhaler Commonly known as: SYMBICORT Inhale 1 puff into the lungs daily.         calcium carbonate 750 MG chewable tablet Commonly known as: TUMS EX Chew 750 mg by mouth daily.         cholecalciferol 1000 units tablet Commonly known as: VITAMIN D Take 1,000 Units  by mouth daily.         cyclobenzaprine 5 MG tablet Commonly known as: FLEXERIL Take 2.5 mg by mouth at bedtime.         docusate sodium 100 MG capsule Commonly known as: COLACE Take 100 mg by mouth 2 (two) times daily.         ferrous sulfate 325 (65 FE) MG tablet Take 325 mg by mouth. Once A Day on Mon, Wed, Fri         hydrocortisone cream 1 % Apply 1  application topically 2 (two) times daily as needed (wrists).         hydrocortisone valerate cream 0.2 % Commonly known as: WESTCORT Apply 1 application topically 2 (two) times daily as needed (for itching). Cleanse behind ears with gentle cleanser and dry completely.         ketoconazole 2 % cream Commonly known as: NIZORAL Apply 1 application topically 2 (two) times daily as needed for irritation.         metoprolol tartrate 25 MG tablet Commonly known as: LOPRESSOR Take 12.5 mg by mouth 2 (two) times daily.         nitrofurantoin 50 MG capsule Commonly known as: MACRODANTIN Take 50 mg by mouth at bedtime.         Olopatadine HCl 0.2 % Soln Place 1 drop into both eyes daily.         polyethylene glycol 17 g packet Commonly known as: MIRALAX / GLYCOLAX Take 17 g by mouth 2 (two) times a week. Sunday and Wednesday         pregabalin 75 MG capsule Commonly known as: LYRICA Take 1 capsule (75 mg total) by mouth at bedtime.         Propylene Glycol 0.6 % Soln Place 1 drop into both eyes 4 (four) times daily.         vitamin B-12 1000 MCG tablet Commonly known as: CYANOCOBALAMIN Take 1,000 mcg by mouth daily.           Relevant Imaging Results:  Relevant Lab Results:   Additional Information [id number]  Baldo Daub [place], [place]

## 2021-03-20 NOTE — Progress Notes (Signed)
Inpatient Rehab Admissions Coordinator:   Reviewed case with Dr. Naaman Plummer.  Per chart pt appears back to cognitive baseline with no clear acute cause for AMS.  At this time, he does not feel she meets criteria for medical necessity for CIR.  I will sign off.    Shann Medal, PT, DPT Admissions Coordinator [phone] 03/20/21  10:47 AM

## 2021-03-23 ENCOUNTER — Non-Acute Institutional Stay: Payer: Medicare Other | Admitting: Nurse Practitioner

## 2021-03-23 ENCOUNTER — Telehealth: Payer: Self-pay | Admitting: *Deleted

## 2021-03-23 ENCOUNTER — Encounter: Payer: Self-pay | Admitting: Nurse Practitioner

## 2021-03-23 DIAGNOSIS — I69398 Other sequelae of cerebral infarction: Secondary | ICD-10-CM | POA: Diagnosis not present

## 2021-03-23 DIAGNOSIS — I63411 Cerebral infarction due to embolism of right middle cerebral artery: Secondary | ICD-10-CM | POA: Diagnosis not present

## 2021-03-23 DIAGNOSIS — J4489 Other specified chronic obstructive pulmonary disease: Secondary | ICD-10-CM

## 2021-03-23 DIAGNOSIS — D62 Acute posthemorrhagic anemia: Secondary | ICD-10-CM

## 2021-03-23 DIAGNOSIS — N952 Postmenopausal atrophic vaginitis: Secondary | ICD-10-CM | POA: Diagnosis not present

## 2021-03-23 DIAGNOSIS — J449 Chronic obstructive pulmonary disease, unspecified: Secondary | ICD-10-CM | POA: Diagnosis not present

## 2021-03-23 DIAGNOSIS — R29898 Other symptoms and signs involving the musculoskeletal system: Secondary | ICD-10-CM | POA: Diagnosis not present

## 2021-03-23 DIAGNOSIS — H538 Other visual disturbances: Secondary | ICD-10-CM | POA: Diagnosis not present

## 2021-03-23 DIAGNOSIS — R278 Other lack of coordination: Secondary | ICD-10-CM | POA: Diagnosis not present

## 2021-03-23 DIAGNOSIS — N390 Urinary tract infection, site not specified: Secondary | ICD-10-CM

## 2021-03-23 DIAGNOSIS — R2681 Unsteadiness on feet: Secondary | ICD-10-CM | POA: Diagnosis not present

## 2021-03-23 DIAGNOSIS — I10 Essential (primary) hypertension: Secondary | ICD-10-CM | POA: Diagnosis not present

## 2021-03-23 DIAGNOSIS — R41841 Cognitive communication deficit: Secondary | ICD-10-CM | POA: Diagnosis not present

## 2021-03-23 DIAGNOSIS — M6281 Muscle weakness (generalized): Secondary | ICD-10-CM | POA: Diagnosis not present

## 2021-03-23 DIAGNOSIS — R7303 Prediabetes: Secondary | ICD-10-CM

## 2021-03-23 DIAGNOSIS — M797 Fibromyalgia: Secondary | ICD-10-CM | POA: Diagnosis not present

## 2021-03-23 DIAGNOSIS — R442 Other hallucinations: Secondary | ICD-10-CM

## 2021-03-23 DIAGNOSIS — K5901 Slow transit constipation: Secondary | ICD-10-CM | POA: Diagnosis not present

## 2021-03-23 DIAGNOSIS — E876 Hypokalemia: Secondary | ICD-10-CM | POA: Insufficient documentation

## 2021-03-23 DIAGNOSIS — K219 Gastro-esophageal reflux disease without esophagitis: Secondary | ICD-10-CM | POA: Diagnosis not present

## 2021-03-23 DIAGNOSIS — I69998 Other sequelae following unspecified cerebrovascular disease: Secondary | ICD-10-CM | POA: Diagnosis not present

## 2021-03-23 NOTE — Assessment & Plan Note (Signed)
takes MiraLax 2x/wk. Colace daily.  

## 2021-03-23 NOTE — Progress Notes (Addendum)
Location:   [facility] Room Number: 917-A Place of Service:  [facility] [phone]) Provider:  Yama Nielson, NP    Patient Care Team: Lexxie Winberg X, NP as PCP - General (Internal Medicine) Elysa Womac X, NP as Nurse Practitioner (Internal Medicine)  Extended Emergency Contact Information Primary Emergency Contact: Howat,Jeff Home Phone: [phone] Relation: Son Secondary Emergency Contact: Culbreth Sr.,Christopher Home Phone: [phone] Relation: Relative  Code Status:  FULL CODE Goals of care: Advanced Directive information Advanced Directives 03/23/2021  Does Patient Have a Medical Advance Directive? No  Type of Advance Directive -  Does patient want to make changes to medical advance directive? -  Copy of Healthcare Power of Attorney in Chart? -  Would patient like information on creating a medical advance directive? No - Patient declined  Pre-existing out of facility DNR order (yellow form or pink MOST form) -     Chief Complaint  Patient presents with   Acute Visit    Medication review following hospital stay.    HPI:  Pt is a 88 y.o. female seen today for an acute visit for medication review following hospital stay.   Hospitalized 03/18/21-03/20/21 for AMS, CT x2 showed no acute findings except age indeterminate right corona radiator infarct, small vessel changes. Neurology recommended no further work ups. The patient returned to [facility] for therapy.               Chronic lower back, leg pain, better, on Flexeril 2.5mg  qhs, Lyrica 75mg  qd, but c/o blurred vision since Lyrica was increased to 75mg  qhs.              HTN, blood pressure is controlled on Metoprolol 12.5mg  bid. Bun/creat 20/0.70 03/20/21             Urinary symptoms, on  Nitrofurantoin 50mg  qd for UTI suppression, Dr. Vernie Ammons.              Estrace vaginal cream for atrophic vaginitis.              Anemia, dropped to 6.8 in hospital s/p 2 units of PRBC,  Vit B12, Iron, Hgb 11.1 03/20/21              GERD/chronic gastritis/atrophic gastritis, GI bleed 02/2020, s/p EGD, per biopsy, takes Protonix. F/u GI prn             COPD, takes Symbicort.              Fibromyalgia takes Lyrica             Tactile hallucinations, off meds. TSH 0.661 03/19/21             Constipation, takes MiraLax 2x/wk. Colace daily.   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     Fibromyalgia    GERD 12/19/2006   Qualifier: Diagnosis of  By: Renaldo Fiddler CMA, Marliss Czar  04/13/16 TSH 1.20, Na 141, K 4.2, Bun 15, creat 0.79, wbc 5.5, Hgb 11.5, plt 283    GERD (gastroesophageal reflux disease)    Hammer toe of second toe of left foot 04/08/2016   Left 2nd   Hearing loss    Hemorrhoids    Hypercholesterolemia    Hypertension    Osteoarthritis 12/19/2006   Qualifier: Diagnosis of  By: Renaldo Fiddler CMA, Leigh     Osteoporosis    Past Surgical History:  Procedure Laterality Date   BIOPSY  02/17/2020   Procedure: BIOPSY;  Surgeon: Lynann Bologna, MD;  Location: [facility];  Service: Endoscopy;;   CATARACT EXTRACTION, BILATERAL  2009   COLONOSCOPY  2009, 2006 , 2017   ESOPHAGOGASTRODUODENOSCOPY (EGD) WITH PROPOFOL N/A 02/17/2020   Procedure: ESOPHAGOGASTRODUODENOSCOPY (EGD) WITH PROPOFOL;  Surgeon: Lynann Bologna, MD;  Location: [facility];  Service: Endoscopy;   Laterality: N/A;   IR ANGIOGRAM SELECTIVE EACH ADDITIONAL VESSEL  02/17/2020   IR ANGIOGRAM SELECTIVE EACH ADDITIONAL VESSEL  02/17/2020   IR ANGIOGRAM SELECTIVE EACH ADDITIONAL VESSEL  02/17/2020   IR ANGIOGRAM SELECTIVE EACH ADDITIONAL VESSEL  02/17/2020   IR ANGIOGRAM SELECTIVE EACH ADDITIONAL VESSEL  02/17/2020   IR ANGIOGRAM VISCERAL SELECTIVE  02/17/2020   IR ANGIOGRAM VISCERAL SELECTIVE  02/17/2020   IR US GUIDE VASC ACCESS RIGHT  02/17/2020   stapes surgery     Dr Dorma Russell   TONSILLECTOMY AND ADENOIDECTOMY     as a child    Allergies  Allergen Reactions   Penicillins Anaphylaxis   Bisphosphonates Other (See Comments)    [name]   Influenza Vaccines     Unknown   Simvastatin     Unknown    Trazodone And Nefazodone     Felt her throat was closing up/kept her awake   Sulfonamide Derivatives Rash and Other (See Comments)    blisters    Allergies as of 03/23/2021       Reactions   Penicillins Anaphylaxis   Bisphosphonates Other (See Comments)   [name]   Influenza Vaccines    Unknown   Simvastatin    Unknown    Trazodone And Nefazodone    Felt her throat was closing up/kept her awake   Sulfonamide Derivatives Rash, Other (See Comments)   blisters        Medication List        Accurate as of March 23, 2021 11:59 PM. If you have any questions, ask your nurse or doctor.          budesonide-formoterol 80-4.5 MCG/ACT inhaler Commonly known as: SYMBICORT Inhale 1 puff into the lungs daily.   calcium carbonate 750 MG chewable tablet Commonly known as: TUMS EX Chew 750 mg by mouth daily.   cholecalciferol 1000 units tablet Commonly known as: VITAMIN D Take 1,000 Units by mouth daily.   cyclobenzaprine 5 MG tablet Commonly known as: FLEXERIL Take 2.5 mg by mouth at bedtime.   docusate sodium 100 MG capsule Commonly known as: COLACE Take 100 mg by mouth 2 (two) times daily.   ferrous sulfate 325 (65 FE) MG tablet Take 325 mg by  mouth. Once A Day on Mon, Wed, Fri   hydrocortisone cream 1 % Apply 1 application topically 2 (two) times daily as needed (wrists).   hydrocortisone valerate  cream 0.2 % Commonly known as: WESTCORT Apply 1 application topically 2 (two) times daily as needed (for itching). Cleanse behind ears with gentle cleanser and dry completely.   ketoconazole 2 % cream Commonly known as: NIZORAL Apply 1 application topically 2 (two) times daily as needed for irritation.   metoprolol tartrate 25 MG tablet Commonly known as: LOPRESSOR Take 12.5 mg by mouth 2 (two) times daily.   nitrofurantoin 50 MG capsule Commonly known as: MACRODANTIN Take 50 mg by mouth at bedtime.   Olopatadine HCl 0.2 % Soln Place 1 drop into both eyes daily.   polyethylene glycol 17 g packet Commonly known as: MIRALAX / GLYCOLAX Take 17 g by mouth 2 (two) times a week. Sunday and Wednesday   pregabalin 75 MG capsule Commonly known as: LYRICA Take 1 capsule (75 mg total) by mouth at bedtime.   Propylene Glycol 0.6 % Soln Place 1 drop into both eyes 4 (four) times daily.   vitamin B-12 1000 MCG tablet Commonly known as: CYANOCOBALAMIN Take 1,000 mcg by mouth daily.        Review of Systems  Constitutional:  Positive for fatigue. Negative for appetite change and fever.  HENT:  Positive for hearing loss. Negative for congestion and voice change.   Eyes:  Negative for visual disturbance.       C/o blurred vision, ? Color, ? Right eye peripheral vision loss.   Respiratory:  Negative for cough and wheezing.   Cardiovascular:  Positive for leg swelling.  Gastrointestinal:  Negative for abdominal pain and constipation.  Genitourinary:  Negative for dysuria and urgency.       Chronic, on and off  Musculoskeletal:  Positive for arthralgias, back pain, gait problem and myalgias.  Skin:  Negative for color change.  Neurological:  Negative for speech difficulty, weakness, light-headedness and headaches.   Psychiatric/Behavioral:  Negative for behavioral problems, hallucinations and sleep disturbance.        Chronic going to bed late, sleeping in late   Immunization History  Administered Date(s) Administered   Moderna SARS-COV2 Booster Vaccination 12/25/2019   Moderna Sars-Covid-2 Vaccination 02/17/2019, 03/17/2019   Pneumococcal Conjugate-13 09/01/2015   Pneumococcal Polysaccharide-23 07/07/1998   Td 08/21/2009   Pertinent  Health Maintenance Due  Topic Date Due   DEXA SCAN  Completed   INFLUENZA VACCINE  Discontinued   Fall Risk 03/18/2021 03/19/2021 03/19/2021 03/20/2021 03/20/2021  Falls in the past year? - - - - -  Was there an injury with Fall? - - - - -  Patient Fall Risk Level High fall risk High fall risk High fall risk High fall risk High fall risk  Patient at Risk for Falls Due to - - - - -   Functional Status Survey:    Vitals:   03/23/21 1008  BP: 122/66  Pulse: 70  Resp: 18  Temp: (!) 97.4 F (36.3 C)  SpO2: 94%  Height: 5\' 1"  (1.549 m)   Body mass index is 27.74 kg/m. Physical Exam Vitals and nursing note reviewed.  Constitutional:      Appearance: Normal appearance.  HENT:     Head: Normocephalic and atraumatic.     Nose: Nose normal.     Mouth/Throat:     Mouth: Mucous membranes are moist.  Eyes:     Extraocular Movements: Extraocular movements intact.     Conjunctiva/sclera: Conjunctivae normal.     Pupils: Pupils are equal, round, and reactive to light.     Comments: Able to count my  fingers 3 feet away from her eyes.   Cardiovascular:     Rate and Rhythm: Normal rate and regular rhythm.     Heart sounds: No murmur heard. Pulmonary:     Effort: Pulmonary effort is normal.     Breath sounds: No rales.     Comments: Decreased air entry to both lungs.  Abdominal:     General: Bowel sounds are normal.     Palpations: Abdomen is soft.     Tenderness: There is no abdominal tenderness.  Musculoskeletal:     Cervical back: Normal range of motion  and neck supple.     Right lower leg: Edema present.     Left lower leg: Edema present.     Comments: Trace edema BLE  Skin:    General: Skin is warm and dry.  Neurological:     General: No focal deficit present.     Mental Status: She is alert. Mental status is at baseline.     Motor: No weakness.     Coordination: Coordination normal.     Gait: Gait abnormal.     Comments: Oriented to person, place.   Psychiatric:        Mood and Affect: Mood normal.        Behavior: Behavior normal.    Labs reviewed: Recent Labs    02/05/21 0000 03/18/21 1039 03/20/21 0456  NA 141 136 135  K 4.0 3.9 3.2*  CL 108 101 106  CO2 27* 26 22  GLUCOSE  --  143* 96  BUN 15 18 20   CREATININE 0.8 0.99 0.70  CALCIUM 8.7 9.7 8.1*   Recent Labs    08/07/20 0000 02/05/21 0000 03/18/21 1039  AST 15 12* 22  ALT 10 9 15   ALKPHOS 78 69 87  BILITOT  --   --  0.9  PROT  --   --  7.7  ALBUMIN 3.6 3.6 4.5   Recent Labs    02/05/21 0000 03/18/21 1039 03/20/21 0456  WBC 4.6 7.2 7.8  NEUTROABS 2,636.00 6.2 6.0  HGB 11.7* 13.7 11.1*  HCT 35* 41.3 34.1*  MCV  --  92.2 93.7  PLT 187 204 145*   Lab Results  Component Value Date   TSH 0.661 03/19/2021   Lab Results  Component Value Date   HGBA1C 6.0 (H) 03/19/2021   Lab Results  Component Value Date   CHOL 192 03/19/2021   HDL 69 03/19/2021   LDLCALC 107 (H) 03/19/2021   LDLDIRECT 114.7 08/18/2011   TRIG 78 03/19/2021   CHOLHDL 2.8 03/19/2021    Significant Diagnostic Results in last 30 days:  CT ANGIO HEAD W OR WO CONTRAST  Result Date: 03/19/2021 CLINICAL DATA:  Stroke suspected, transient aphasia EXAM: CT ANGIOGRAPHY HEAD AND NECK TECHNIQUE: Multidetector CT imaging of the head and neck was performed using the standard protocol during bolus administration of intravenous contrast. Multiplanar CT image reconstructions and MIPs were obtained to evaluate the vascular anatomy. Carotid stenosis measurements (when applicable) are obtained  utilizing NASCET criteria, using the distal internal carotid diameter as the denominator. RADIATION DOSE REDUCTION: This exam was performed according to the departmental dose-optimization program which includes automated exposure control, adjustment of the mA and/or kV according to patient size and/or use of iterative reconstruction technique. CONTRAST:  75mL OMNIPAQUE IOHEXOL 350 MG/ML SOLN COMPARISON:  No prior CTA, correlation is made with CT head 03/19/2021 FINDINGS: CT HEAD FINDINGS For noncontrast findings, please see same day CT head. CTA NECK  FINDINGS Aortic arch: Two-vessel arch with a common origin of the brachiocephalic and left common carotid arteries. Imaged portion shows no evidence of aneurysm or dissection. No significant stenosis of the major arch vessel origins. Aortic atherosclerosis. Right carotid system: No evidence of dissection, stenosis (50% or greater) or occlusion. Calcifications at the bifurcation and in the proximal right ICA are not hemodynamically significant. Retropharyngeal course of the right common carotid artery. Left carotid system: No evidence of dissection, stenosis (50% or greater) or occlusion. Calcifications at the bifurcation and in the proximal left ICA are not hemodynamically significant. Vertebral arteries: Severe focal narrowing in the right V1 segment (series 7, image 240), possibly secondary to noncalcified plaque. The vertebral arteries are otherwise patent, without additional stenosis, dissection, or occlusion. Skeleton: Degenerative changes in the cervical spine, with reversal of the normal cervical lordosis and anterolisthesis of C2 on C3 and C3 on C4, which appears degenerative. No acute osseous abnormality. Other neck: Negative. Upper chest: Severe emphysema. No focal pulmonary opacity or pleural effusion. Apical pleural-parenchymal scarring. Review of the MIP images confirms the above findings CTA HEAD FINDINGS Anterior circulation: Both internal carotid  arteries are patent to the termini, with mild calcifications but without significant stenosis. A1 segments patent, with mild long segment narrowing of the proximal and mid right A1. Normal anterior communicating artery. Anterior cerebral arteries are patent to their distal aspects. No M1 stenosis or occlusion. Normal MCA bifurcations. Distal MCA branches are mildly irregular but perfused and symmetric. Posterior circulation: Vertebral arteries patent to the vertebrobasilar junction without stenosis. Posterior inferior cerebral arteries patent bilaterally. Basilar patent to its distal aspect. Superior cerebellar arteries patent bilaterally. Patent P1 segments. PCAs perfused to their distal aspects without stenosis. The right posterior communicating artery is patent. The left posterior communicating artery is not visualized. Venous sinuses: As permitted by contrast timing, patent. Anatomic variants: None significant Review of the MIP images confirms the above findings IMPRESSION: 1.  No intracranial large vessel occlusion or significant stenosis. 2. Severe focal narrowing in the right V1 segment, with otherwise patent right vertebral artery. No other hemodynamically significant stenosis in the neck. Electronically Signed   By: Wiliam Ke M.D.   On: 03/19/2021 23:23   CT HEAD WO CONTRAST  Result Date: 03/19/2021 CLINICAL DATA:  Follow-up stroke EXAM: CT HEAD WITHOUT CONTRAST TECHNIQUE: Contiguous axial images were obtained from the base of the skull through the vertex without intravenous contrast. RADIATION DOSE REDUCTION: This exam was performed according to the departmental dose-optimization program which includes automated exposure control, adjustment of the mA and/or kV according to patient size and/or use of iterative reconstruction technique. COMPARISON:  None. FINDINGS: Brain: No evidence of acute infarction, hemorrhage, hydrocephalus, extra-axial collection or mass lesion/mass effect. Unchanged extensive  periventricular and deep white matter hypodensity with mild global cerebral volume loss. Focal hypodensity of the right corona radiata is unchanged, consistent with a small, strictly age indeterminate, although likely chronic lacunar infarction (series 2, image 18). Vascular: No hyperdense vessel or unexpected calcification. Skull: Normal. Negative for fracture or focal lesion. Sinuses/Orbits: No acute finding. Other: None. IMPRESSION: 1. No definite acute intracranial pathology. Focal hypodensity of the right corona radiata is unchanged, consistent with a small, strictly age indeterminate, although likely chronic lacunar infarction. 2. Unchanged extensive periventricular and deep white matter hypodensity with mild global cerebral volume loss, in keeping with advanced small vessel white matter disease. Electronically Signed   By: Jearld Lesch M.D.   On: 03/19/2021 10:42   CT Head Wo  Contrast  Result Date: 03/18/2021 CLINICAL DATA:  Mental status change, unknown cause. Additional history provided: EXAM: CT HEAD WITHOUT CONTRAST TECHNIQUE: Contiguous axial images were obtained from the base of the skull through the vertex without intravenous contrast. RADIATION DOSE REDUCTION: This exam was performed according to the departmental dose-optimization program which includes automated exposure control, adjustment of the mA and/or kV according to patient size and/or use of iterative reconstruction technique. COMPARISON:  No pertinent prior exams available for comparison. FINDINGS: Brain: Mild-to-moderate generalized cerebral atrophy. Small age-indeterminate infarct within the right corona radiata. Background moderate patchy and ill-defined hypoattenuation within the cerebral white matter, nonspecific but compatible with chronic small vessel ischemic disease. Chronic lacunar infarcts within the left basal ganglia and left thalamus. There is no acute intracranial hemorrhage. No demarcated cortical infarct. No extra-axial  fluid collection. No evidence of an intracranial mass. No midline shift. Vascular: No hyperdense vessel. Atherosclerotic calcifications. Skull: Normal. Negative for fracture or focal lesion. Sinuses/Orbits: Visualized orbits show no acute finding. Trace mucosal thickening within the bilateral ethmoid sinuses. IMPRESSION: A small infarct within the right corona radiata is age-indeterminate (although favored subacute or chronic). A brain MRI may be obtained for further evaluation, as clinically warranted. Background moderate chronic small vessel ischemic changes within the cerebral white matter. Chronic lacunar infarcts within the left basal ganglia and left thalamus. Mild-to-moderate generalized cerebral atrophy. Electronically Signed   By: Jackey Loge D.O.   On: 03/18/2021 11:46   CT ANGIO NECK W OR WO CONTRAST  Result Date: 03/19/2021 CLINICAL DATA:  Stroke suspected, transient aphasia EXAM: CT ANGIOGRAPHY HEAD AND NECK TECHNIQUE: Multidetector CT imaging of the head and neck was performed using the standard protocol during bolus administration of intravenous contrast. Multiplanar CT image reconstructions and MIPs were obtained to evaluate the vascular anatomy. Carotid stenosis measurements (when applicable) are obtained utilizing NASCET criteria, using the distal internal carotid diameter as the denominator. RADIATION DOSE REDUCTION: This exam was performed according to the departmental dose-optimization program which includes automated exposure control, adjustment of the mA and/or kV according to patient size and/or use of iterative reconstruction technique. CONTRAST:  75mL OMNIPAQUE IOHEXOL 350 MG/ML SOLN COMPARISON:  No prior CTA, correlation is made with CT head 03/19/2021 FINDINGS: CT HEAD FINDINGS For noncontrast findings, please see same day CT head. CTA NECK FINDINGS Aortic arch: Two-vessel arch with a common origin of the brachiocephalic and left common carotid arteries. Imaged portion shows no  evidence of aneurysm or dissection. No significant stenosis of the major arch vessel origins. Aortic atherosclerosis. Right carotid system: No evidence of dissection, stenosis (50% or greater) or occlusion. Calcifications at the bifurcation and in the proximal right ICA are not hemodynamically significant. Retropharyngeal course of the right common carotid artery. Left carotid system: No evidence of dissection, stenosis (50% or greater) or occlusion. Calcifications at the bifurcation and in the proximal left ICA are not hemodynamically significant. Vertebral arteries: Severe focal narrowing in the right V1 segment (series 7, image 240), possibly secondary to noncalcified plaque. The vertebral arteries are otherwise patent, without additional stenosis, dissection, or occlusion. Skeleton: Degenerative changes in the cervical spine, with reversal of the normal cervical lordosis and anterolisthesis of C2 on C3 and C3 on C4, which appears degenerative. No acute osseous abnormality. Other neck: Negative. Upper chest: Severe emphysema. No focal pulmonary opacity or pleural effusion. Apical pleural-parenchymal scarring. Review of the MIP images confirms the above findings CTA HEAD FINDINGS Anterior circulation: Both internal carotid arteries are patent to the termini, with  mild calcifications but without significant stenosis. A1 segments patent, with mild long segment narrowing of the proximal and mid right A1. Normal anterior communicating artery. Anterior cerebral arteries are patent to their distal aspects. No M1 stenosis or occlusion. Normal MCA bifurcations. Distal MCA branches are mildly irregular but perfused and symmetric. Posterior circulation: Vertebral arteries patent to the vertebrobasilar junction without stenosis. Posterior inferior cerebral arteries patent bilaterally. Basilar patent to its distal aspect. Superior cerebellar arteries patent bilaterally. Patent P1 segments. PCAs perfused to their distal  aspects without stenosis. The right posterior communicating artery is patent. The left posterior communicating artery is not visualized. Venous sinuses: As permitted by contrast timing, patent. Anatomic variants: None significant Review of the MIP images confirms the above findings IMPRESSION: 1.  No intracranial large vessel occlusion or significant stenosis. 2. Severe focal narrowing in the right V1 segment, with otherwise patent right vertebral artery. No other hemodynamically significant stenosis in the neck. Electronically Signed   By: Wiliam Ke M.D.   On: 03/19/2021 23:23   EEG adult  Result Date: 03/19/2021 Charlsie Quest, MD     03/19/2021  8:36 AM Patient Name: Lisa King MRN: [id number] Epilepsy Attending: Charlsie Quest Referring Physician/Provider: Teddy Spike, DO Date:03/18/2021 Duration: 22.21 mins Patient history: 88 year old female with altered mental status.  EEG to evaluate for seizure Level of alertness: Awake AEDs during EEG study: None Technical aspects: This EEG study was done with scalp electrodes positioned according to the 10-20 International system of electrode placement. Electrical activity was acquired at a sampling rate of 500Hz  and reviewed with a high frequency filter of 70Hz  and a low frequency filter of 1Hz . EEG data were recorded continuously and digitally stored. Description: EEG showed continuous generalized 3 to 6 Hz theta-delta slowing. Hyperventilation and photic stimulation were not performed.   ABNORMALITY - Continuous slow, generalized IMPRESSION: This study is suggestive of moderate diffuse encephalopathy, nonspecific etiology. No seizures or epileptiform discharges were seen throughout the recording. Charlsie Quest   ECHOCARDIOGRAM COMPLETE  Result Date: 03/19/2021    ECHOCARDIOGRAM REPORT   Patient Name:   Lisa King Date of Exam: 03/19/2021 Medical Rec #:  [id number]     Height:       61.0 in Accession #:    [id number]    Weight:       146.8 lb Date of  Birth:  20-Dec-1931     BSA:          1.656 m Patient Age:    89 years      BP:           94/52 mmHg Patient Gender: F             HR:           86 bpm. Exam Location:  Inpatient Procedure: 2D Echo, Cardiac Doppler and Color Doppler Indications:    STROKE  History:        Patient has no prior history of Echocardiogram examinations.                 COPD; Risk Factors:Hypertension and Dyslipidemia.  Sonographer:    Festus Barren Referring Phys: [id number] TYRONE A KYLE IMPRESSIONS  1. Narrow LVOT with turbulent flow. No significant obstruction at rest. . Left ventricular ejection fraction, by estimation, is >75%. The left ventricle has hyperdynamic function. The left ventricle has no regional wall motion abnormalities. There is mild concentric left ventricular hypertrophy. Left ventricular diastolic parameters are consistent with  Grade I diastolic dysfunction (impaired relaxation). Elevated left atrial pressure.  2. Right ventricular systolic function is normal. The right ventricular size is normal.  3. Left atrial size was moderately dilated.  4. Trivial mitral valve regurgitation. Severe mitral annular calcification.  5. The aortic valve is tricuspid. Aortic valve regurgitation is not visualized. Aortic valve sclerosis/calcification is present, without any evidence of aortic stenosis.  6. The inferior vena cava is normal in size with greater than 50% respiratory variability, suggesting right atrial pressure of 3 mmHg. FINDINGS  Left Ventricle: Narrow LVOT with turbulent flow. No significant obstruction at rest. Left ventricular ejection fraction, by estimation, is >75%. The left ventricle has hyperdynamic function. The left ventricle has no regional wall motion abnormalities. The left ventricular internal cavity size was normal in size. There is mild concentric left ventricular hypertrophy. Left ventricular diastolic parameters are consistent with Grade I diastolic dysfunction (impaired relaxation). Elevated left atrial  pressure. Right Ventricle: The right ventricular size is normal. Right vetricular wall thickness was not assessed. Right ventricular systolic function is normal. Left Atrium: Left atrial size was moderately dilated. Right Atrium: Right atrial size was normal in size. Pericardium: There is no evidence of pericardial effusion. Mitral Valve: There is mild thickening of the mitral valve leaflet(s). Severe mitral annular calcification. Trivial mitral valve regurgitation. MV peak gradient, 113.2 mmHg. The mean mitral valve gradient is 72.0 mmHg. Tricuspid Valve: The tricuspid valve is normal in structure. Tricuspid valve regurgitation is trivial. Aortic Valve: The aortic valve is tricuspid. Aortic valve regurgitation is not visualized. Aortic valve sclerosis/calcification is present, without any evidence of aortic stenosis. Aortic valve mean gradient measures 8.0 mmHg. Aortic valve peak gradient measures 13.4 mmHg. Aortic valve area, by VTI measures 2.26 cm. Pulmonic Valve: The pulmonic valve was not well visualized. Aorta: The aortic root is normal in size and structure. Venous: The inferior vena cava is normal in size with greater than 50% respiratory variability, suggesting right atrial pressure of 3 mmHg. IAS/Shunts: No atrial level shunt detected by color flow Doppler.  LEFT VENTRICLE PLAX 2D LVIDd:         4.39 cm     Diastology LVIDs:         3.00 cm     LV e' medial:    4.79 cm/s LV PW:         1.06 cm     LV E/e' medial:  25.9 LV IVS:        1.27 cm     LV e' lateral:   2.94 cm/s LVOT diam:     1.70 cm     LV E/e' lateral: 42.2 LV SV:         77 LV SV Index:   47 LVOT Area:     2.27 cm  LV Volumes (MOD) LV vol d, MOD A2C: 21.1 ml LV vol d, MOD A4C: 18.0 ml LV vol s, MOD A2C: 4.5 ml LV vol s, MOD A4C: 1.9 ml LV SV MOD A2C:     16.6 ml LV SV MOD A4C:     18.0 ml LV SV MOD BP:      16.8 ml RIGHT VENTRICLE             IVC RV S prime:     13.50 cm/s  IVC diam: 1.40 cm TAPSE (M-mode): 1.9 cm LEFT ATRIUM              Index        RIGHT ATRIUM  Index LA diam:        3.20 cm 1.93 cm/m   RA Area:     9.94 cm LA Vol (A2C):   60.3 ml 36.41 ml/m  RA Volume:   19.40 ml 11.71 ml/m LA Vol (A4C):   62.6 ml 37.80 ml/m LA Biplane Vol: 65.2 ml 39.37 ml/m  AORTIC VALVE AV Area (Vmax):    2.16 cm AV Area (Vmean):   1.91 cm AV Area (VTI):     2.26 cm AV Vmax:           183.00 cm/s AV Vmean:          133.000 cm/s AV VTI:            0.341 m AV Peak Grad:      13.4 mmHg AV Mean Grad:      8.0 mmHg LVOT Vmax:         174.00 cm/s LVOT Vmean:        112.000 cm/s LVOT VTI:          0.340 m LVOT/AV VTI ratio: 1.00  AORTA Ao Root diam: 2.80 cm MITRAL VALVE MV Area (PHT): 2.33 cm     SHUNTS MV Area VTI:   0.49 cm     Systemic VTI:  0.34 m MV Peak grad:  113.2 mmHg   Systemic Diam: 1.70 cm MV Mean grad:  72.0 mmHg MV Vmax:       5.32 m/s MV Vmean:      398.0 cm/s MV Decel Time: 325 msec MV E velocity: 124.00 cm/s MV A velocity: 172.00 cm/s MV E/A ratio:  0.72 Dietrich Pates MD Electronically signed by Dietrich Pates MD Signature Date/Time: 03/19/2021/1:47:59 PM    Final     Assessment/Plan Blurred vision, bilateral ? Difficulty seeing color, R eye limited peripheral vision, f/u Ophthalmology   Fibromyalgia Chronic lower back, leg pain, better, on Flexeril 2.5mg  qhs, Lyrica 75mg  qd, but c/o blurred vision since Lyrica was increased to 75mg  qhs.   HTN (hypertension) blood pressure is controlled on Metoprolol 12.5mg  bid. Bun/creat 20/0.70 03/20/21  CVA (cerebral vascular accident) [facility]) Hospitalized 03/18/21-03/20/21 for AMS, CT x2 showed no acute findings except age indeterminate right corona radiator infarct, small vessel changes. Neurology recommended no further work ups. The patient returned to [facility] for therapy.   UTI (urinary tract infection) on  Nitrofurantoin 50mg  qd for UTI suppression, Dr. Vernie Ammons.   Atrophic vaginitis Estrace vaginal cream for atrophic vaginitis.   ABLA (acute blood loss anemia)  dropped to 6.8 in  hospital s/p 2 units of PRBC,  Vit B12, Iron, Hgb 11.1 03/20/21  GERD GERD/chronic gastritis/atrophic gastritis, GI bleed 02/2020, s/p EGD, per biopsy, takes Protonix. F/u GI prn  COPD (chronic obstructive pulmonary disease) with chronic bronchitis (HCC) takes Symbicort.   Tactile hallucination off meds. TSH 0.661 03/19/21  Slow transit constipation takes MiraLax 2x/wk. Colace daily.   Prediabetes Diet control, Hgb a1c 6.0 03/19/21  Hypokalemia Repeat BMP in am.  03/24/21 K 3.7     Family/ staff Communication: plan of care reviewed with the patient and charge nurse.   Labs/tests ordered:  BMP  Time spend 40 minutes.

## 2021-03-23 NOTE — Assessment & Plan Note (Signed)
blood pressure is controlled on Metoprolol 12.5mg  bid. Bun/creat 20/0.70 03/20/21

## 2021-03-23 NOTE — Assessment & Plan Note (Signed)
?   Difficulty seeing color, R eye limited peripheral vision, f/u Ophthalmology

## 2021-03-23 NOTE — Telephone Encounter (Signed)
Transition Care Management Unsuccessful Follow-up Telephone Call  Date of discharge and from where:  03/20/2021 [facility]  Attempts:  1st Attempt  Reason for unsuccessful TCM follow-up call:  Unable to reach patient [place]

## 2021-03-23 NOTE — Assessment & Plan Note (Signed)
takes Symbicort.

## 2021-03-23 NOTE — Assessment & Plan Note (Addendum)
Repeat BMP in am.  03/24/21 K 3.7

## 2021-03-23 NOTE — Assessment & Plan Note (Signed)
Estrace vaginal cream for atrophic vaginitis.  

## 2021-03-23 NOTE — Assessment & Plan Note (Signed)
off meds. TSH 0.661 03/19/21

## 2021-03-23 NOTE — Assessment & Plan Note (Signed)
GERD/chronic gastritis/atrophic gastritis, GI bleed 02/2020, s/p EGD, per biopsy, takes Protonix. F/u GI prn 

## 2021-03-23 NOTE — Assessment & Plan Note (Signed)
Chronic lower back, leg pain, better, on Flexeril 2.5mg  qhs, Lyrica 75mg  qd, but c/o blurred vision since Lyrica was increased to 75mg  qhs.

## 2021-03-23 NOTE — Assessment & Plan Note (Signed)
on  Nitrofurantoin 50mg qd for UTI suppression, Dr. Ottelin.  

## 2021-03-23 NOTE — Assessment & Plan Note (Signed)
Hospitalized 03/18/21-03/20/21 for AMS, CT x2 showed no acute findings except age indeterminate right corona radiator infarct, small vessel changes. Neurology recommended no further work ups. The patient returned to [place] for therapy.

## 2021-03-23 NOTE — Assessment & Plan Note (Signed)
dropped to 6.8 in hospital s/p 2 units of PRBC,  Vit B12, Iron, Hgb 11.1 03/20/21

## 2021-03-23 NOTE — Assessment & Plan Note (Signed)
Diet control, Hgb a1c 6.0 03/19/21

## 2021-03-24 ENCOUNTER — Encounter: Payer: Self-pay | Admitting: Internal Medicine

## 2021-03-24 ENCOUNTER — Encounter: Payer: Self-pay | Admitting: Nurse Practitioner

## 2021-03-24 ENCOUNTER — Non-Acute Institutional Stay: Payer: Medicare Other | Admitting: Internal Medicine

## 2021-03-24 DIAGNOSIS — I69398 Other sequelae of cerebral infarction: Secondary | ICD-10-CM | POA: Diagnosis not present

## 2021-03-24 DIAGNOSIS — H538 Other visual disturbances: Secondary | ICD-10-CM

## 2021-03-24 DIAGNOSIS — I10 Essential (primary) hypertension: Secondary | ICD-10-CM

## 2021-03-24 DIAGNOSIS — R2681 Unsteadiness on feet: Secondary | ICD-10-CM | POA: Diagnosis not present

## 2021-03-24 DIAGNOSIS — M6281 Muscle weakness (generalized): Secondary | ICD-10-CM | POA: Diagnosis not present

## 2021-03-24 DIAGNOSIS — J449 Chronic obstructive pulmonary disease, unspecified: Secondary | ICD-10-CM

## 2021-03-24 DIAGNOSIS — R41841 Cognitive communication deficit: Secondary | ICD-10-CM | POA: Diagnosis not present

## 2021-03-24 DIAGNOSIS — R404 Transient alteration of awareness: Secondary | ICD-10-CM | POA: Diagnosis not present

## 2021-03-24 DIAGNOSIS — R531 Weakness: Secondary | ICD-10-CM

## 2021-03-24 DIAGNOSIS — D649 Anemia, unspecified: Secondary | ICD-10-CM | POA: Diagnosis not present

## 2021-03-24 LAB — BASIC METABOLIC PANEL
BUN: 11 (ref 4–21)
CO2: 26 — AB (ref 13–22)
Chloride: 107 (ref 99–108)
Creatinine: 0.8 (ref 0.5–1.1)
Glucose: 91
Potassium: 3.7 (ref 3.4–5.3)
Sodium: 140 (ref 137–147)

## 2021-03-24 LAB — COMPREHENSIVE METABOLIC PANEL: Calcium: 8.6 — AB (ref 8.7–10.7)

## 2021-03-24 NOTE — Progress Notes (Signed)
Location:   [facility] Room Number: [id number] Place of Service:  ALF [phone]) Provider:  Einar Crow MD  Mast, Man X, NP  Patient Care Team: Mast, Man X, NP as PCP - General (Internal Medicine) Mast, Man X, NP as Nurse Practitioner (Internal Medicine)  Extended Emergency Contact Information Primary Emergency Contact: Bunt,Jeff Home Phone: [phone] Relation: Son Secondary Emergency Contact: Culbreth Sr.,Christopher Home Phone: [phone] Relation: Relative  Code Status:  Full Code Goals of care: Advanced Directive information Advanced Directives 03/23/2021  Does Patient Have a Medical Advance Directive? No  Type of Advance Directive -  Does patient want to make changes to medical advance directive? -  Copy of Healthcare Power of Attorney in Chart? -  Would patient like information on creating a medical advance directive? No - Patient declined  Pre-existing out of facility DNR order (yellow form or pink MOST form) -     Chief Complaint  Patient presents with   Acute Visit    HPI:  Pt is a 88 y.o. female seen today for an acute visit for Weakness ans Hypertension  She was admitted in Hospital from 02/01-02/03 for Acute mental status change possible New CVA   Patient is long term resident of AL unit in [facility]. Patient has h/o fibromyalgia on Lyrica  hypertension, hyperlipidemia  anemia, osteoporosis, COPD , Insomnia and Tactile hallucinations but patient refuses to take any Meds for it Chronic nonsteroidal use  Patient was send to ED for acute confusion And Mental status change Work up including CT scan of head which showed no acute findings MRI could not be done due to stapedes Implant EEG was negative also  Her mental status improved and she was discharged  Since been here she has been feeling weak . Needing more help with her ADLS. Had few readings of High BP Mental status  at baseline Denies Dizziness. No Nausea.  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     Fibromyalgia    GERD 12/19/2006   Qualifier: Diagnosis of  By: Renaldo Fiddler CMA, Marliss Czar  04/13/16 TSH 1.20, Na 141, K 4.2, Bun 15, creat 0.79, wbc 5.5, Hgb 11.5, plt 283    GERD (gastroesophageal reflux disease)    Hammer toe of second toe of left foot 04/08/2016   Left 2nd   Hearing loss    Hemorrhoids    Hypercholesterolemia    Hypertension    Osteoarthritis 12/19/2006   Qualifier: Diagnosis of  By: Renaldo Fiddler CMA, Leigh     Osteoporosis    Past Surgical History:  Procedure Laterality Date   BIOPSY  02/17/2020   Procedure: BIOPSY;  Surgeon: Lynann Bologna, MD;  Location: [facility];  Service: Endoscopy;;   CATARACT EXTRACTION, BILATERAL  2009   COLONOSCOPY  2009, 2006 , 2017   ESOPHAGOGASTRODUODENOSCOPY (EGD) WITH PROPOFOL N/A 02/17/2020   Procedure: ESOPHAGOGASTRODUODENOSCOPY (EGD) WITH PROPOFOL;  Surgeon: Lynann Bologna, MD;  Location: [facility];  Service: Endoscopy;  Laterality: N/A;   IR ANGIOGRAM SELECTIVE EACH ADDITIONAL VESSEL  02/17/2020   IR ANGIOGRAM SELECTIVE EACH ADDITIONAL VESSEL  02/17/2020   IR ANGIOGRAM SELECTIVE EACH ADDITIONAL VESSEL  02/17/2020   IR ANGIOGRAM SELECTIVE EACH ADDITIONAL VESSEL  02/17/2020   IR ANGIOGRAM SELECTIVE EACH ADDITIONAL VESSEL  02/17/2020   IR ANGIOGRAM VISCERAL SELECTIVE  02/17/2020   IR ANGIOGRAM VISCERAL SELECTIVE  02/17/2020   IR US  GUIDE VASC ACCESS RIGHT  02/17/2020   stapes surgery     Dr Dorma Russell   TONSILLECTOMY AND ADENOIDECTOMY     as a child    Allergies  Allergen Reactions   Penicillins Anaphylaxis   Bisphosphonates Other (See Comments)    [name]   Influenza Vaccines     Unknown   Simvastatin     Unknown    Trazodone And Nefazodone     Felt her throat was closing up/kept her awake   Sulfonamide Derivatives Rash and Other (See Comments)    blisters    Allergies as of 03/24/2021       Reactions   Penicillins Anaphylaxis   Bisphosphonates Other (See Comments)   [name]   Influenza Vaccines    Unknown   Simvastatin    Unknown    Trazodone And Nefazodone    Felt her throat was closing up/kept her awake   Sulfonamide Derivatives Rash, Other (See Comments)   blisters        Medication List        Accurate as of March 24, 2021 11:14 AM. If you have any questions, ask your nurse or doctor.          budesonide-formoterol 80-4.5 MCG/ACT inhaler Commonly known as: SYMBICORT Inhale 1 puff into the lungs daily.   calcium carbonate 750 MG chewable tablet Commonly known as: TUMS EX Chew 750 mg by mouth daily.   cholecalciferol 1000 units tablet Commonly known as: VITAMIN D Take 1,000 Units by mouth daily.   cyclobenzaprine 5 MG tablet Commonly known as: FLEXERIL Take 2.5 mg by mouth at bedtime.   docusate sodium 100 MG capsule Commonly known as: COLACE Take 100 mg by mouth 2 (two) times daily.   ferrous sulfate 325 (65 FE) MG tablet Take 325 mg by mouth. Once A Day on Mon, Wed, Fri   hydrocortisone cream 1 % Apply 1 application topically 2 (two) times daily as needed (wrists).   hydrocortisone valerate cream 0.2 % Commonly known as: WESTCORT Apply 1 application topically 2 (two) times daily as needed (for itching). Cleanse behind ears with gentle cleanser and dry completely.   ketoconazole 2 % cream Commonly known as: NIZORAL Apply 1 application topically 2  (two) times daily as needed for irritation.   metoprolol tartrate 25 MG tablet Commonly known as: LOPRESSOR Take 12.5 mg by mouth 2 (two) times daily.   nitrofurantoin 50 MG capsule Commonly known as: MACRODANTIN Take 50 mg by mouth at bedtime.   Olopatadine HCl 0.2 % Soln Place 1 drop into both eyes daily.   polyethylene glycol 17 g packet Commonly known as: MIRALAX / GLYCOLAX Take 17 g by mouth 2 (two) times a week. Sunday and Wednesday   pregabalin 75 MG capsule Commonly known as: LYRICA Take 1 capsule (75 mg total) by mouth at bedtime.   Propylene  Glycol 0.6 % Soln Place 1 drop into both eyes 4 (four) times daily.   vitamin B-12 1000 MCG tablet Commonly known as: CYANOCOBALAMIN Take 1,000 mcg by mouth daily.        Review of Systems  Constitutional:  Positive for activity change. Negative for appetite change.  HENT: Negative.    Respiratory:  Positive for wheezing. Negative for cough and shortness of breath.   Cardiovascular:  Negative for leg swelling.  Gastrointestinal:  Negative for constipation.  Genitourinary: Negative.   Musculoskeletal:  Negative for arthralgias, gait problem and myalgias.  Skin: Negative.   Neurological:  Positive for weakness. Negative for dizziness.  Psychiatric/Behavioral:  Negative for confusion, dysphoric mood and sleep disturbance.    Immunization History  Administered Date(s) Administered   Moderna SARS-COV2 Booster Vaccination 12/25/2019   Moderna Sars-Covid-2 Vaccination 02/17/2019, 03/17/2019   Pneumococcal Conjugate-13 09/01/2015   Pneumococcal Polysaccharide-23 07/07/1998   Td 08/21/2009   Pertinent  Health Maintenance Due  Topic Date Due   DEXA SCAN  Completed   INFLUENZA VACCINE  Discontinued   Fall Risk 03/18/2021 03/19/2021 03/19/2021 03/20/2021 03/20/2021  Falls in the past year? - - - - -  Was there an injury with Fall? - - - - -  Patient Fall Risk Level High fall risk High fall risk High fall risk High fall risk High  fall risk  Patient at Risk for Falls Due to - - - - -   Functional Status Survey:    Vitals:   03/24/21 1057  BP: 112/70  Pulse: 64  Resp: 18  Temp: (!) 97.1 F (36.2 C)  SpO2: 94%  Weight: 145 lb 3.2 oz (65.9 kg)  Height: 5\' 1"  (1.549 m)   Body mass index is 27.44 kg/m. Physical Exam Vitals reviewed.  Constitutional:      Appearance: Normal appearance.  HENT:     Head: Normocephalic.     Nose: Nose normal.     Mouth/Throat:     Mouth: Mucous membranes are moist.     Pharynx: Oropharynx is clear.  Eyes:     Pupils: Pupils are equal, round, and reactive to light.  Cardiovascular:     Rate and Rhythm: Normal rate and regular rhythm.     Pulses: Normal pulses.     Heart sounds: Murmur heard.  Pulmonary:     Effort: Pulmonary effort is normal.     Breath sounds: Normal breath sounds.     Comments: Some Expiratory wheezing Abdominal:     General: Abdomen is flat. Bowel sounds are normal.     Palpations: Abdomen is soft.  Musculoskeletal:        General: No swelling.     Cervical back: Neck supple.  Skin:    General: Skin is warm.  Neurological:     General: No focal deficit present.     Mental Status: She is alert and oriented to person, place, and time.     Comments: Feels weak and Per therapy having difficulty doing her ADLS  Psychiatric:        Mood and Affect: Mood normal.        Thought Content: Thought content normal.    Labs reviewed: Recent Labs    02/05/21 0000 03/18/21 1039 03/20/21 0456  NA 141 136 135  K 4.0 3.9 3.2*  CL 108 101 106  CO2 27* 26 22  GLUCOSE  --  143* 96  BUN 15 18 20   CREATININE 0.8 0.99 0.70  CALCIUM 8.7 9.7  8.1*   Recent Labs    08/07/20 0000 02/05/21 0000 03/18/21 1039  AST 15 12* 22  ALT 10 9 15   ALKPHOS 78 69 87  BILITOT  --   --  0.9  PROT  --   --  7.7  ALBUMIN 3.6 3.6 4.5   Recent Labs    02/05/21 0000 03/18/21 1039 03/20/21 0456  WBC 4.6 7.2 7.8  NEUTROABS 2,636.00 6.2 6.0  HGB 11.7* 13.7 11.1*   HCT 35* 41.3 34.1*  MCV  --  92.2 93.7  PLT 187 204 145*   Lab Results  Component Value Date   TSH 0.661 03/19/2021   Lab Results  Component Value Date   HGBA1C 6.0 (H) 03/19/2021   Lab Results  Component Value Date   CHOL 192 03/19/2021   HDL 69 03/19/2021   LDLCALC 107 (H) 03/19/2021   LDLDIRECT 114.7 08/18/2011   TRIG 78 03/19/2021   CHOLHDL 2.8 03/19/2021    Significant Diagnostic Results in last 30 days:  CT ANGIO HEAD W OR WO CONTRAST  Result Date: 03/19/2021 CLINICAL DATA:  Stroke suspected, transient aphasia EXAM: CT ANGIOGRAPHY HEAD AND NECK TECHNIQUE: Multidetector CT imaging of the head and neck was performed using the standard protocol during bolus administration of intravenous contrast. Multiplanar CT image reconstructions and MIPs were obtained to evaluate the vascular anatomy. Carotid stenosis measurements (when applicable) are obtained utilizing NASCET criteria, using the distal internal carotid diameter as the denominator. RADIATION DOSE REDUCTION: This exam was performed according to the departmental dose-optimization program which includes automated exposure control, adjustment of the mA and/or kV according to patient size and/or use of iterative reconstruction technique. CONTRAST:  75mL OMNIPAQUE IOHEXOL 350 MG/ML SOLN COMPARISON:  No prior CTA, correlation is made with CT head 03/19/2021 FINDINGS: CT HEAD FINDINGS For noncontrast findings, please see same day CT head. CTA NECK FINDINGS Aortic arch: Two-vessel arch with a common origin of the brachiocephalic and left common carotid arteries. Imaged portion shows no evidence of aneurysm or dissection. No significant stenosis of the major arch vessel origins. Aortic atherosclerosis. Right carotid system: No evidence of dissection, stenosis (50% or greater) or occlusion. Calcifications at the bifurcation and in the proximal right ICA are not hemodynamically significant. Retropharyngeal course of the right common carotid  artery. Left carotid system: No evidence of dissection, stenosis (50% or greater) or occlusion. Calcifications at the bifurcation and in the proximal left ICA are not hemodynamically significant. Vertebral arteries: Severe focal narrowing in the right V1 segment (series 7, image 240), possibly secondary to noncalcified plaque. The vertebral arteries are otherwise patent, without additional stenosis, dissection, or occlusion. Skeleton: Degenerative changes in the cervical spine, with reversal of the normal cervical lordosis and anterolisthesis of C2 on C3 and C3 on C4, which appears degenerative. No acute osseous abnormality. Other neck: Negative. Upper chest: Severe emphysema. No focal pulmonary opacity or pleural effusion. Apical pleural-parenchymal scarring. Review of the MIP images confirms the above findings CTA HEAD FINDINGS Anterior circulation: Both internal carotid arteries are patent to the termini, with mild calcifications but without significant stenosis. A1 segments patent, with mild long segment narrowing of the proximal and mid right A1. Normal anterior communicating artery. Anterior cerebral arteries are patent to their distal aspects. No M1 stenosis or occlusion. Normal MCA bifurcations. Distal MCA branches are mildly irregular but perfused and symmetric. Posterior circulation: Vertebral arteries patent to the vertebrobasilar junction without stenosis. Posterior inferior cerebral arteries patent bilaterally. Basilar patent to its distal aspect. Superior  cerebellar arteries patent bilaterally. Patent P1 segments. PCAs perfused to their distal aspects without stenosis. The right posterior communicating artery is patent. The left posterior communicating artery is not visualized. Venous sinuses: As permitted by contrast timing, patent. Anatomic variants: None significant Review of the MIP images confirms the above findings IMPRESSION: 1.  No intracranial large vessel occlusion or significant stenosis. 2.  Severe focal narrowing in the right V1 segment, with otherwise patent right vertebral artery. No other hemodynamically significant stenosis in the neck. Electronically Signed   By: Wiliam Ke M.D.   On: 03/19/2021 23:23   CT HEAD WO CONTRAST  Result Date: 03/19/2021 CLINICAL DATA:  Follow-up stroke EXAM: CT HEAD WITHOUT CONTRAST TECHNIQUE: Contiguous axial images were obtained from the base of the skull through the vertex without intravenous contrast. RADIATION DOSE REDUCTION: This exam was performed according to the departmental dose-optimization program which includes automated exposure control, adjustment of the mA and/or kV according to patient size and/or use of iterative reconstruction technique. COMPARISON:  None. FINDINGS: Brain: No evidence of acute infarction, hemorrhage, hydrocephalus, extra-axial collection or mass lesion/mass effect. Unchanged extensive periventricular and deep white matter hypodensity with mild global cerebral volume loss. Focal hypodensity of the right corona radiata is unchanged, consistent with a small, strictly age indeterminate, although likely chronic lacunar infarction (series 2, image 18). Vascular: No hyperdense vessel or unexpected calcification. Skull: Normal. Negative for fracture or focal lesion. Sinuses/Orbits: No acute finding. Other: None. IMPRESSION: 1. No definite acute intracranial pathology. Focal hypodensity of the right corona radiata is unchanged, consistent with a small, strictly age indeterminate, although likely chronic lacunar infarction. 2. Unchanged extensive periventricular and deep white matter hypodensity with mild global cerebral volume loss, in keeping with advanced small vessel white matter disease. Electronically Signed   By: Jearld Lesch M.D.   On: 03/19/2021 10:42   CT Head Wo Contrast  Result Date: 03/18/2021 CLINICAL DATA:  Mental status change, unknown cause. Additional history provided: EXAM: CT HEAD WITHOUT CONTRAST TECHNIQUE:  Contiguous axial images were obtained from the base of the skull through the vertex without intravenous contrast. RADIATION DOSE REDUCTION: This exam was performed according to the departmental dose-optimization program which includes automated exposure control, adjustment of the mA and/or kV according to patient size and/or use of iterative reconstruction technique. COMPARISON:  No pertinent prior exams available for comparison. FINDINGS: Brain: Mild-to-moderate generalized cerebral atrophy. Small age-indeterminate infarct within the right corona radiata. Background moderate patchy and ill-defined hypoattenuation within the cerebral white matter, nonspecific but compatible with chronic small vessel ischemic disease. Chronic lacunar infarcts within the left basal ganglia and left thalamus. There is no acute intracranial hemorrhage. No demarcated cortical infarct. No extra-axial fluid collection. No evidence of an intracranial mass. No midline shift. Vascular: No hyperdense vessel. Atherosclerotic calcifications. Skull: Normal. Negative for fracture or focal lesion. Sinuses/Orbits: Visualized orbits show no acute finding. Trace mucosal thickening within the bilateral ethmoid sinuses. IMPRESSION: A small infarct within the right corona radiata is age-indeterminate (although favored subacute or chronic). A brain MRI may be obtained for further evaluation, as clinically warranted. Background moderate chronic small vessel ischemic changes within the cerebral white matter. Chronic lacunar infarcts within the left basal ganglia and left thalamus. Mild-to-moderate generalized cerebral atrophy. Electronically Signed   By: Jackey Loge D.O.   On: 03/18/2021 11:46   CT ANGIO NECK W OR WO CONTRAST  Result Date: 03/19/2021 CLINICAL DATA:  Stroke suspected, transient aphasia EXAM: CT ANGIOGRAPHY HEAD AND NECK TECHNIQUE: Multidetector CT imaging  of the head and neck was performed using the standard protocol during bolus  administration of intravenous contrast. Multiplanar CT image reconstructions and MIPs were obtained to evaluate the vascular anatomy. Carotid stenosis measurements (when applicable) are obtained utilizing NASCET criteria, using the distal internal carotid diameter as the denominator. RADIATION DOSE REDUCTION: This exam was performed according to the departmental dose-optimization program which includes automated exposure control, adjustment of the mA and/or kV according to patient size and/or use of iterative reconstruction technique. CONTRAST:  75mL OMNIPAQUE IOHEXOL 350 MG/ML SOLN COMPARISON:  No prior CTA, correlation is made with CT head 03/19/2021 FINDINGS: CT HEAD FINDINGS For noncontrast findings, please see same day CT head. CTA NECK FINDINGS Aortic arch: Two-vessel arch with a common origin of the brachiocephalic and left common carotid arteries. Imaged portion shows no evidence of aneurysm or dissection. No significant stenosis of the major arch vessel origins. Aortic atherosclerosis. Right carotid system: No evidence of dissection, stenosis (50% or greater) or occlusion. Calcifications at the bifurcation and in the proximal right ICA are not hemodynamically significant. Retropharyngeal course of the right common carotid artery. Left carotid system: No evidence of dissection, stenosis (50% or greater) or occlusion. Calcifications at the bifurcation and in the proximal left ICA are not hemodynamically significant. Vertebral arteries: Severe focal narrowing in the right V1 segment (series 7, image 240), possibly secondary to noncalcified plaque. The vertebral arteries are otherwise patent, without additional stenosis, dissection, or occlusion. Skeleton: Degenerative changes in the cervical spine, with reversal of the normal cervical lordosis and anterolisthesis of C2 on C3 and C3 on C4, which appears degenerative. No acute osseous abnormality. Other neck: Negative. Upper chest: Severe emphysema. No focal  pulmonary opacity or pleural effusion. Apical pleural-parenchymal scarring. Review of the MIP images confirms the above findings CTA HEAD FINDINGS Anterior circulation: Both internal carotid arteries are patent to the termini, with mild calcifications but without significant stenosis. A1 segments patent, with mild long segment narrowing of the proximal and mid right A1. Normal anterior communicating artery. Anterior cerebral arteries are patent to their distal aspects. No M1 stenosis or occlusion. Normal MCA bifurcations. Distal MCA branches are mildly irregular but perfused and symmetric. Posterior circulation: Vertebral arteries patent to the vertebrobasilar junction without stenosis. Posterior inferior cerebral arteries patent bilaterally. Basilar patent to its distal aspect. Superior cerebellar arteries patent bilaterally. Patent P1 segments. PCAs perfused to their distal aspects without stenosis. The right posterior communicating artery is patent. The left posterior communicating artery is not visualized. Venous sinuses: As permitted by contrast timing, patent. Anatomic variants: None significant Review of the MIP images confirms the above findings IMPRESSION: 1.  No intracranial large vessel occlusion or significant stenosis. 2. Severe focal narrowing in the right V1 segment, with otherwise patent right vertebral artery. No other hemodynamically significant stenosis in the neck. Electronically Signed   By: Wiliam Ke M.D.   On: 03/19/2021 23:23   EEG adult  Result Date: 03/19/2021 Lisa Quest, MD     03/19/2021  8:36 AM Patient Name: Lisa King MRN: [id number] Epilepsy Attending: Charlsie King Referring Physician/Provider: Teddy Spike, DO Date:03/18/2021 Duration: 22.21 mins Patient history: 88 year old female with altered mental status.  EEG to evaluate for seizure Level of alertness: Awake AEDs during EEG study: None Technical aspects: This EEG study was done with scalp electrodes positioned  according to the 10-20 International system of electrode placement. Electrical activity was acquired at a sampling rate of 500Hz  and reviewed with a high frequency filter  of 70Hz  and a low frequency filter of 1Hz . EEG data were recorded continuously and digitally stored. Description: EEG showed continuous generalized 3 to 6 Hz theta-delta slowing. Hyperventilation and photic stimulation were not performed.   ABNORMALITY - Continuous slow, generalized IMPRESSION: This study is suggestive of moderate diffuse encephalopathy, nonspecific etiology. No seizures or epileptiform discharges were seen throughout the recording. Lisa King   ECHOCARDIOGRAM COMPLETE  Result Date: 03/19/2021    ECHOCARDIOGRAM REPORT   Patient Name:   Lisa King Date of Exam: 03/19/2021 Medical Rec #:  [id number]     Height:       61.0 in Accession #:    [id number]    Weight:       146.8 lb Date of Birth:  30-Oct-1931     BSA:          1.656 m Patient Age:    89 years      BP:           94/52 mmHg Patient Gender: F             HR:           86 bpm. Exam Location:  Inpatient Procedure: 2D Echo, Cardiac Doppler and Color Doppler Indications:    STROKE  History:        Patient has no prior history of Echocardiogram examinations.                 COPD; Risk Factors:Hypertension and Dyslipidemia.  Sonographer:    Festus Barren Referring Phys: [id number] TYRONE A KYLE IMPRESSIONS  1. Narrow LVOT with turbulent flow. No significant obstruction at rest. . Left ventricular ejection fraction, by estimation, is >75%. The left ventricle has hyperdynamic function. The left ventricle has no regional wall motion abnormalities. There is mild concentric left ventricular hypertrophy. Left ventricular diastolic parameters are consistent with Grade I diastolic dysfunction (impaired relaxation). Elevated left atrial pressure.  2. Right ventricular systolic function is normal. The right ventricular size is normal.  3. Left atrial size was moderately dilated.  4.  Trivial mitral valve regurgitation. Severe mitral annular calcification.  5. The aortic valve is tricuspid. Aortic valve regurgitation is not visualized. Aortic valve sclerosis/calcification is present, without any evidence of aortic stenosis.  6. The inferior vena cava is normal in size with greater than 50% respiratory variability, suggesting right atrial pressure of 3 mmHg. FINDINGS  Left Ventricle: Narrow LVOT with turbulent flow. No significant obstruction at rest. Left ventricular ejection fraction, by estimation, is >75%. The left ventricle has hyperdynamic function. The left ventricle has no regional wall motion abnormalities. The left ventricular internal cavity size was normal in size. There is mild concentric left ventricular hypertrophy. Left ventricular diastolic parameters are consistent with Grade I diastolic dysfunction (impaired relaxation). Elevated left atrial pressure. Right Ventricle: The right ventricular size is normal. Right vetricular wall thickness was not assessed. Right ventricular systolic function is normal. Left Atrium: Left atrial size was moderately dilated. Right Atrium: Right atrial size was normal in size. Pericardium: There is no evidence of pericardial effusion. Mitral Valve: There is mild thickening of the mitral valve leaflet(s). Severe mitral annular calcification. Trivial mitral valve regurgitation. MV peak gradient, 113.2 mmHg. The mean mitral valve gradient is 72.0 mmHg. Tricuspid Valve: The tricuspid valve is normal in structure. Tricuspid valve regurgitation is trivial. Aortic Valve: The aortic valve is tricuspid. Aortic valve regurgitation is not visualized. Aortic valve sclerosis/calcification is present, without any evidence of aortic stenosis. Aortic valve  mean gradient measures 8.0 mmHg. Aortic valve peak gradient measures 13.4 mmHg. Aortic valve area, by VTI measures 2.26 cm. Pulmonic Valve: The pulmonic valve was not well visualized. Aorta: The aortic root is  normal in size and structure. Venous: The inferior vena cava is normal in size with greater than 50% respiratory variability, suggesting right atrial pressure of 3 mmHg. IAS/Shunts: No atrial level shunt detected by color flow Doppler.  LEFT VENTRICLE PLAX 2D LVIDd:         4.39 cm     Diastology LVIDs:         3.00 cm     LV e' medial:    4.79 cm/s LV PW:         1.06 cm     LV E/e' medial:  25.9 LV IVS:        1.27 cm     LV e' lateral:   2.94 cm/s LVOT diam:     1.70 cm     LV E/e' lateral: 42.2 LV SV:         77 LV SV Index:   47 LVOT Area:     2.27 cm  LV Volumes (MOD) LV vol d, MOD A2C: 21.1 ml LV vol d, MOD A4C: 18.0 ml LV vol s, MOD A2C: 4.5 ml LV vol s, MOD A4C: 1.9 ml LV SV MOD A2C:     16.6 ml LV SV MOD A4C:     18.0 ml LV SV MOD BP:      16.8 ml RIGHT VENTRICLE             IVC RV S prime:     13.50 cm/s  IVC diam: 1.40 cm TAPSE (M-mode): 1.9 cm LEFT ATRIUM             Index        RIGHT ATRIUM          Index LA diam:        3.20 cm 1.93 cm/m   RA Area:     9.94 cm LA Vol (A2C):   60.3 ml 36.41 ml/m  RA Volume:   19.40 ml 11.71 ml/m LA Vol (A4C):   62.6 ml 37.80 ml/m LA Biplane Vol: 65.2 ml 39.37 ml/m  AORTIC VALVE AV Area (Vmax):    2.16 cm AV Area (Vmean):   1.91 cm AV Area (VTI):     2.26 cm AV Vmax:           183.00 cm/s AV Vmean:          133.000 cm/s AV VTI:            0.341 m AV Peak Grad:      13.4 mmHg AV Mean Grad:      8.0 mmHg LVOT Vmax:         174.00 cm/s LVOT Vmean:        112.000 cm/s LVOT VTI:          0.340 m LVOT/AV VTI ratio: 1.00  AORTA Ao Root diam: 2.80 cm MITRAL VALVE MV Area (PHT): 2.33 cm     SHUNTS MV Area VTI:   0.49 cm     Systemic VTI:  0.34 m MV Peak grad:  113.2 mmHg   Systemic Diam: 1.70 cm MV Mean grad:  72.0 mmHg MV Vmax:       5.32 m/s MV Vmean:      398.0 cm/s MV Decel Time: 325 msec MV E velocity: 124.00 cm/s MV A  velocity: 172.00 cm/s MV E/A ratio:  0.72 Dietrich Pates MD Electronically signed by Dietrich Pates MD Signature Date/Time: 03/19/2021/1:47:59 PM    Final      Assessment/Plan Transient alteration of awareness Mental status at baseline now Feels Weak Will continue to work with therapy but if continues to need help will  Weakness possible Transfer to SNF  Blurred vision, bilateral Says it is better today Has Appointment with Opthalmology Primary hypertension Will continue ot monitor Hydralazine Prn for now COPD (chronic obstructive pulmonary disease) with chronic bronchitis (HCC) Add Proair Prn to Symbicort Osteoarthritis of multiple joints, unspecified osteoarthritis type On Flexeril Peripheral polyneuropathy Continue Lyrica Recurent UTI On Macrodantin  Anemia On Iron Hgb stable   Family/ staff Communication:   Labs/tests ordered:

## 2021-03-26 DIAGNOSIS — I69398 Other sequelae of cerebral infarction: Secondary | ICD-10-CM | POA: Diagnosis not present

## 2021-03-26 DIAGNOSIS — J449 Chronic obstructive pulmonary disease, unspecified: Secondary | ICD-10-CM | POA: Diagnosis not present

## 2021-03-26 DIAGNOSIS — I10 Essential (primary) hypertension: Secondary | ICD-10-CM | POA: Diagnosis not present

## 2021-03-26 DIAGNOSIS — R2681 Unsteadiness on feet: Secondary | ICD-10-CM | POA: Diagnosis not present

## 2021-03-26 DIAGNOSIS — R41841 Cognitive communication deficit: Secondary | ICD-10-CM | POA: Diagnosis not present

## 2021-03-26 DIAGNOSIS — D649 Anemia, unspecified: Secondary | ICD-10-CM | POA: Diagnosis not present

## 2021-03-26 DIAGNOSIS — M6281 Muscle weakness (generalized): Secondary | ICD-10-CM | POA: Diagnosis not present

## 2021-03-26 LAB — CBC AND DIFFERENTIAL
HCT: 32 — AB (ref 36–46)
Hemoglobin: 10.7 — AB (ref 12.0–16.0)
Neutrophils Absolute: 3641
Platelets: 207 (ref 150–399)
WBC: 5.5

## 2021-03-26 LAB — CBC: RBC: 3.48 — AB (ref 3.87–5.11)

## 2021-03-26 LAB — BASIC METABOLIC PANEL
BUN: 13 (ref 4–21)
CO2: 27 — AB (ref 13–22)
Chloride: 107 (ref 99–108)
Creatinine: 0.8 (ref 0.5–1.1)
Glucose: 97
Potassium: 4 (ref 3.4–5.3)
Sodium: 140 (ref 137–147)

## 2021-03-26 LAB — COMPREHENSIVE METABOLIC PANEL: Calcium: 8.9 (ref 8.7–10.7)

## 2021-03-27 DIAGNOSIS — M6281 Muscle weakness (generalized): Secondary | ICD-10-CM | POA: Diagnosis not present

## 2021-03-27 DIAGNOSIS — I10 Essential (primary) hypertension: Secondary | ICD-10-CM | POA: Diagnosis not present

## 2021-03-27 DIAGNOSIS — I69398 Other sequelae of cerebral infarction: Secondary | ICD-10-CM | POA: Diagnosis not present

## 2021-03-27 DIAGNOSIS — H53411 Scotoma involving central area, right eye: Secondary | ICD-10-CM | POA: Diagnosis not present

## 2021-03-27 DIAGNOSIS — R2681 Unsteadiness on feet: Secondary | ICD-10-CM | POA: Diagnosis not present

## 2021-03-27 DIAGNOSIS — H353231 Exudative age-related macular degeneration, bilateral, with active choroidal neovascularization: Secondary | ICD-10-CM | POA: Diagnosis not present

## 2021-03-27 DIAGNOSIS — R41841 Cognitive communication deficit: Secondary | ICD-10-CM | POA: Diagnosis not present

## 2021-03-27 DIAGNOSIS — J449 Chronic obstructive pulmonary disease, unspecified: Secondary | ICD-10-CM | POA: Diagnosis not present

## 2021-03-30 ENCOUNTER — Encounter: Payer: Self-pay | Admitting: Nurse Practitioner

## 2021-03-30 DIAGNOSIS — J449 Chronic obstructive pulmonary disease, unspecified: Secondary | ICD-10-CM | POA: Diagnosis not present

## 2021-03-30 DIAGNOSIS — I69398 Other sequelae of cerebral infarction: Secondary | ICD-10-CM | POA: Diagnosis not present

## 2021-03-30 DIAGNOSIS — M6281 Muscle weakness (generalized): Secondary | ICD-10-CM | POA: Diagnosis not present

## 2021-03-30 DIAGNOSIS — R41841 Cognitive communication deficit: Secondary | ICD-10-CM | POA: Diagnosis not present

## 2021-03-30 DIAGNOSIS — R2681 Unsteadiness on feet: Secondary | ICD-10-CM | POA: Diagnosis not present

## 2021-03-30 DIAGNOSIS — I10 Essential (primary) hypertension: Secondary | ICD-10-CM | POA: Diagnosis not present

## 2021-03-31 DIAGNOSIS — J449 Chronic obstructive pulmonary disease, unspecified: Secondary | ICD-10-CM | POA: Diagnosis not present

## 2021-03-31 DIAGNOSIS — R41841 Cognitive communication deficit: Secondary | ICD-10-CM | POA: Diagnosis not present

## 2021-03-31 DIAGNOSIS — I69398 Other sequelae of cerebral infarction: Secondary | ICD-10-CM | POA: Diagnosis not present

## 2021-03-31 DIAGNOSIS — R2681 Unsteadiness on feet: Secondary | ICD-10-CM | POA: Diagnosis not present

## 2021-03-31 DIAGNOSIS — M6281 Muscle weakness (generalized): Secondary | ICD-10-CM | POA: Diagnosis not present

## 2021-03-31 DIAGNOSIS — I10 Essential (primary) hypertension: Secondary | ICD-10-CM | POA: Diagnosis not present

## 2021-04-01 DIAGNOSIS — R2681 Unsteadiness on feet: Secondary | ICD-10-CM | POA: Diagnosis not present

## 2021-04-01 DIAGNOSIS — I69398 Other sequelae of cerebral infarction: Secondary | ICD-10-CM | POA: Diagnosis not present

## 2021-04-01 DIAGNOSIS — I10 Essential (primary) hypertension: Secondary | ICD-10-CM | POA: Diagnosis not present

## 2021-04-01 DIAGNOSIS — J449 Chronic obstructive pulmonary disease, unspecified: Secondary | ICD-10-CM | POA: Diagnosis not present

## 2021-04-01 DIAGNOSIS — R41841 Cognitive communication deficit: Secondary | ICD-10-CM | POA: Diagnosis not present

## 2021-04-01 DIAGNOSIS — M6281 Muscle weakness (generalized): Secondary | ICD-10-CM | POA: Diagnosis not present

## 2021-04-02 DIAGNOSIS — I10 Essential (primary) hypertension: Secondary | ICD-10-CM | POA: Diagnosis not present

## 2021-04-02 DIAGNOSIS — M6281 Muscle weakness (generalized): Secondary | ICD-10-CM | POA: Diagnosis not present

## 2021-04-02 DIAGNOSIS — I69398 Other sequelae of cerebral infarction: Secondary | ICD-10-CM | POA: Diagnosis not present

## 2021-04-02 DIAGNOSIS — R41841 Cognitive communication deficit: Secondary | ICD-10-CM | POA: Diagnosis not present

## 2021-04-02 DIAGNOSIS — J449 Chronic obstructive pulmonary disease, unspecified: Secondary | ICD-10-CM | POA: Diagnosis not present

## 2021-04-02 DIAGNOSIS — R2681 Unsteadiness on feet: Secondary | ICD-10-CM | POA: Diagnosis not present

## 2021-04-03 DIAGNOSIS — R41841 Cognitive communication deficit: Secondary | ICD-10-CM | POA: Diagnosis not present

## 2021-04-03 DIAGNOSIS — J449 Chronic obstructive pulmonary disease, unspecified: Secondary | ICD-10-CM | POA: Diagnosis not present

## 2021-04-03 DIAGNOSIS — M6281 Muscle weakness (generalized): Secondary | ICD-10-CM | POA: Diagnosis not present

## 2021-04-03 DIAGNOSIS — I10 Essential (primary) hypertension: Secondary | ICD-10-CM | POA: Diagnosis not present

## 2021-04-03 DIAGNOSIS — R2681 Unsteadiness on feet: Secondary | ICD-10-CM | POA: Diagnosis not present

## 2021-04-03 DIAGNOSIS — I69398 Other sequelae of cerebral infarction: Secondary | ICD-10-CM | POA: Diagnosis not present

## 2021-04-04 DIAGNOSIS — M6281 Muscle weakness (generalized): Secondary | ICD-10-CM | POA: Diagnosis not present

## 2021-04-04 DIAGNOSIS — I10 Essential (primary) hypertension: Secondary | ICD-10-CM | POA: Diagnosis not present

## 2021-04-04 DIAGNOSIS — I69398 Other sequelae of cerebral infarction: Secondary | ICD-10-CM | POA: Diagnosis not present

## 2021-04-04 DIAGNOSIS — R41841 Cognitive communication deficit: Secondary | ICD-10-CM | POA: Diagnosis not present

## 2021-04-04 DIAGNOSIS — J449 Chronic obstructive pulmonary disease, unspecified: Secondary | ICD-10-CM | POA: Diagnosis not present

## 2021-04-04 DIAGNOSIS — R2681 Unsteadiness on feet: Secondary | ICD-10-CM | POA: Diagnosis not present

## 2021-04-06 DIAGNOSIS — I69398 Other sequelae of cerebral infarction: Secondary | ICD-10-CM | POA: Diagnosis not present

## 2021-04-06 DIAGNOSIS — J449 Chronic obstructive pulmonary disease, unspecified: Secondary | ICD-10-CM | POA: Diagnosis not present

## 2021-04-06 DIAGNOSIS — R41841 Cognitive communication deficit: Secondary | ICD-10-CM | POA: Diagnosis not present

## 2021-04-06 DIAGNOSIS — I10 Essential (primary) hypertension: Secondary | ICD-10-CM | POA: Diagnosis not present

## 2021-04-06 DIAGNOSIS — R2681 Unsteadiness on feet: Secondary | ICD-10-CM | POA: Diagnosis not present

## 2021-04-06 DIAGNOSIS — M6281 Muscle weakness (generalized): Secondary | ICD-10-CM | POA: Diagnosis not present

## 2021-04-07 DIAGNOSIS — M6281 Muscle weakness (generalized): Secondary | ICD-10-CM | POA: Diagnosis not present

## 2021-04-07 DIAGNOSIS — I10 Essential (primary) hypertension: Secondary | ICD-10-CM | POA: Diagnosis not present

## 2021-04-07 DIAGNOSIS — R2681 Unsteadiness on feet: Secondary | ICD-10-CM | POA: Diagnosis not present

## 2021-04-07 DIAGNOSIS — I69398 Other sequelae of cerebral infarction: Secondary | ICD-10-CM | POA: Diagnosis not present

## 2021-04-07 DIAGNOSIS — R41841 Cognitive communication deficit: Secondary | ICD-10-CM | POA: Diagnosis not present

## 2021-04-07 DIAGNOSIS — J449 Chronic obstructive pulmonary disease, unspecified: Secondary | ICD-10-CM | POA: Diagnosis not present

## 2021-04-08 DIAGNOSIS — R41841 Cognitive communication deficit: Secondary | ICD-10-CM | POA: Diagnosis not present

## 2021-04-08 DIAGNOSIS — I69398 Other sequelae of cerebral infarction: Secondary | ICD-10-CM | POA: Diagnosis not present

## 2021-04-08 DIAGNOSIS — J449 Chronic obstructive pulmonary disease, unspecified: Secondary | ICD-10-CM | POA: Diagnosis not present

## 2021-04-08 DIAGNOSIS — R2681 Unsteadiness on feet: Secondary | ICD-10-CM | POA: Diagnosis not present

## 2021-04-08 DIAGNOSIS — M6281 Muscle weakness (generalized): Secondary | ICD-10-CM | POA: Diagnosis not present

## 2021-04-08 DIAGNOSIS — I10 Essential (primary) hypertension: Secondary | ICD-10-CM | POA: Diagnosis not present

## 2021-04-09 ENCOUNTER — Encounter: Payer: Self-pay | Admitting: Nurse Practitioner

## 2021-04-09 ENCOUNTER — Non-Acute Institutional Stay: Payer: Medicare Other | Admitting: Nurse Practitioner

## 2021-04-09 DIAGNOSIS — R509 Fever, unspecified: Secondary | ICD-10-CM | POA: Diagnosis not present

## 2021-04-09 DIAGNOSIS — R442 Other hallucinations: Secondary | ICD-10-CM

## 2021-04-09 DIAGNOSIS — J449 Chronic obstructive pulmonary disease, unspecified: Secondary | ICD-10-CM | POA: Diagnosis not present

## 2021-04-09 DIAGNOSIS — R5383 Other fatigue: Secondary | ICD-10-CM

## 2021-04-09 DIAGNOSIS — M159 Polyosteoarthritis, unspecified: Secondary | ICD-10-CM

## 2021-04-09 DIAGNOSIS — R7303 Prediabetes: Secondary | ICD-10-CM | POA: Diagnosis not present

## 2021-04-09 DIAGNOSIS — D649 Anemia, unspecified: Secondary | ICD-10-CM | POA: Diagnosis not present

## 2021-04-09 DIAGNOSIS — M6281 Muscle weakness (generalized): Secondary | ICD-10-CM | POA: Diagnosis not present

## 2021-04-09 DIAGNOSIS — M797 Fibromyalgia: Secondary | ICD-10-CM | POA: Diagnosis not present

## 2021-04-09 DIAGNOSIS — R41841 Cognitive communication deficit: Secondary | ICD-10-CM | POA: Diagnosis not present

## 2021-04-09 DIAGNOSIS — H353130 Nonexudative age-related macular degeneration, bilateral, stage unspecified: Secondary | ICD-10-CM | POA: Diagnosis not present

## 2021-04-09 DIAGNOSIS — N390 Urinary tract infection, site not specified: Secondary | ICD-10-CM

## 2021-04-09 DIAGNOSIS — K219 Gastro-esophageal reflux disease without esophagitis: Secondary | ICD-10-CM | POA: Diagnosis not present

## 2021-04-09 DIAGNOSIS — I69398 Other sequelae of cerebral infarction: Secondary | ICD-10-CM | POA: Diagnosis not present

## 2021-04-09 DIAGNOSIS — I10 Essential (primary) hypertension: Secondary | ICD-10-CM

## 2021-04-09 DIAGNOSIS — E876 Hypokalemia: Secondary | ICD-10-CM

## 2021-04-09 DIAGNOSIS — R2681 Unsteadiness on feet: Secondary | ICD-10-CM | POA: Diagnosis not present

## 2021-04-09 DIAGNOSIS — K5901 Slow transit constipation: Secondary | ICD-10-CM | POA: Diagnosis not present

## 2021-04-09 DIAGNOSIS — J189 Pneumonia, unspecified organism: Secondary | ICD-10-CM | POA: Diagnosis not present

## 2021-04-09 DIAGNOSIS — I63411 Cerebral infarction due to embolism of right middle cerebral artery: Secondary | ICD-10-CM

## 2021-04-09 LAB — HEPATIC FUNCTION PANEL
ALT: 8 U/L (ref 7–35)
AST: 13 (ref 13–35)
Alkaline Phosphatase: 80 (ref 25–125)

## 2021-04-09 LAB — COMPREHENSIVE METABOLIC PANEL
Albumin: 3.8 (ref 3.5–5.0)
Calcium: 8.7 (ref 8.7–10.7)
Globulin: 1.9
eGFR: 55

## 2021-04-09 LAB — CBC AND DIFFERENTIAL
HCT: 35 — AB (ref 36–46)
Hemoglobin: 11.5 — AB (ref 12.0–16.0)
Neutrophils Absolute: 4113
Platelets: 198 10*3/uL (ref 150–400)
WBC: 5.2

## 2021-04-09 LAB — CBC: RBC: 3.82 — AB (ref 3.87–5.11)

## 2021-04-09 LAB — BASIC METABOLIC PANEL
BUN: 13 (ref 4–21)
CO2: 25 — AB (ref 13–22)
Chloride: 103 (ref 99–108)
Creatinine: 1 (ref 0.5–1.1)
Glucose: 109
Potassium: 4.3 mEq/L (ref 3.5–5.1)
Sodium: 135 — AB (ref 137–147)

## 2021-04-09 NOTE — Assessment & Plan Note (Signed)
on  Nitrofurantoin 50mg qd for UTI suppression, Dr. Ottelin. Estrace vaginal cream for atrophic vaginitis.  

## 2021-04-09 NOTE — Assessment & Plan Note (Signed)
Mild diffused wheezes,  takes Symbicort, prn Proair, adding DuoNeb q6h x 72 hours, obtain CXR ap/lateral.

## 2021-04-09 NOTE — Assessment & Plan Note (Signed)
03/27/21 [facility]: OU for [place] MD involving tcentral area. OS exudative age related MD

## 2021-04-09 NOTE — Assessment & Plan Note (Signed)
GERD/chronic gastritis/atrophic gastritis, GI bleed 02/2020, s/p EGD, per biopsy, takes Protonix. F/u GI prn 

## 2021-04-09 NOTE — Progress Notes (Signed)
This encounter was created in error - please disregard.

## 2021-04-09 NOTE — Assessment & Plan Note (Signed)
on Flexeril 2.5mg  qhs, Lyrica 75mg  qd, but c/o blurred vision since Lyrica was increased to 75mg  qhs.

## 2021-04-09 NOTE — Assessment & Plan Note (Signed)
blood pressure is controlled on Metoprolol 12.5mg  bid. Bun/creat 13/0.8 03/26/21

## 2021-04-09 NOTE — Assessment & Plan Note (Signed)
Continue Lyrica. 

## 2021-04-09 NOTE — Assessment & Plan Note (Signed)
K 4.0 03/26/21

## 2021-04-09 NOTE — Assessment & Plan Note (Signed)
Hgb a1c 6.0 03/19/21 

## 2021-04-09 NOTE — Assessment & Plan Note (Signed)
No complaint, off meds. TSH 0.661 03/19/21

## 2021-04-09 NOTE — Assessment & Plan Note (Signed)
T99.9,  fever, lower back aches, wheezing, lethargy. Tested negative COVID. Will obtain CBC/diff, CMP/eGFR, UA C/S, CXR ap/lateral. Prn Tylenol.

## 2021-04-09 NOTE — Assessment & Plan Note (Signed)
takes MiraLax 2x/wk. Colace daily.  

## 2021-04-09 NOTE — Assessment & Plan Note (Addendum)
The patient is easily aroused, followed directions, answered questions appropriately, no new focal weakness, will obtain CXR ap/lateral, CBC/diff, CMP/eGFR, UA C/S. Will change Flexeril to prn.  04/09/21 wbc 5.2, Neutrophils 79.1, Hgb 11.5, Na 135, K 4.3, Bun 13, creat 0.99, eGFR 55

## 2021-04-09 NOTE — Assessment & Plan Note (Signed)
Hx of Hgb 6.8 in hospital s/p 2 units of PRBC,  Vit B12, Iron, Hgb 10.7 03/26/21

## 2021-04-09 NOTE — Progress Notes (Addendum)
Location:   [facility] Room Number: 917 Place of Service:  ALF (13) Provider:  Chipper Oman NP  Whitney Hillegass X, NP  Patient Care Team: Sangeeta Youse X, NP as PCP - General (Internal Medicine) Deolinda Frid X, NP as Nurse Practitioner (Internal Medicine)  Extended Emergency Contact Information Primary Emergency Contact: Guild,Jeff Home Phone: [phone] Relation: Son Secondary Emergency Contact: Culbreth Sr.,Christopher Home Phone: [phone] Relation: Relative  Code Status:  DNR Goals of care: Advanced Directive information Advanced Directives 03/23/2021  Does Patient Have a Medical Advance Directive? No  Type of Advance Directive -  Does patient want to make changes to medical advance directive? -  Copy of Healthcare Power of Attorney in Chart? -  Would patient like information on creating a medical advance directive? No - Patient declined  Pre-existing out of facility DNR order (yellow form or pink MOST form) -     Chief Complaint  Patient presents with   Acute Visit    fever, lethargy    HPI:  Pt is a 88 y.o. female seen today for an acute visit for fever, lower back aches, wheezing, lethargy. Tested negative COVID    Hx of CVA, CT x2 03/2021 showed no acute findings except age indeterminate right corona radiator infarct, small vessel changes. Neurology recommended no further work ups.   Macular degeneration, 03/27/21 [facility]: OU for Scotoma MD involving tcentral area. OS exudative age related MD             Chronic lower back, leg pain, on Flexeril 2.5mg  qhs, Lyrica 75mg  qd, but c/o blurred vision since Lyrica was increased to 75mg  qhs.              HTN, blood pressure is controlled on Metoprolol 12.5mg  bid. Bun/creat 13/0.8 03/26/21             Urinary symptoms, on  Nitrofurantoin 50mg  qd for UTI suppression, Dr. Vernie Ammons.              Estrace vaginal cream for atrophic vaginitis.              Anemia, Hx of Hgb 6.8 in hospital s/p 2 units of PRBC,   Vit B12, Iron, Hgb 10.7 03/26/21             GERD/chronic gastritis/atrophic gastritis, GI bleed 02/2020, s/p EGD, per biopsy, takes Protonix. F/u GI prn             COPD, takes Symbicort, prn Proair             Fibromyalgia takes Lyrica             Tactile hallucinations, off meds. TSH 0.661 03/19/21             Constipation, takes MiraLax 2x/wk. Colace daily.              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     Fibromyalgia    GERD 12/19/2006   Qualifier: Diagnosis of  By: Renaldo Fiddler CMA, Marliss Czar  04/13/16 TSH 1.20, Na 141, K 4.2, Bun 15, creat 0.79, wbc 5.5, Hgb 11.5, plt 283    GERD (gastroesophageal reflux disease)    Hammer toe of second toe of left foot 04/08/2016   Left 2nd   Hearing loss    Hemorrhoids    Hypercholesterolemia    Hypertension    Osteoarthritis 12/19/2006   Qualifier: Diagnosis of  By: Renaldo Fiddler CMA, Leigh     Osteoporosis    Past Surgical History:  Procedure Laterality Date   BIOPSY  02/17/2020   Procedure: BIOPSY;  Surgeon: Lynann Bologna, MD;  Location: [facility];  Service: Endoscopy;;   CATARACT EXTRACTION, BILATERAL  2009   COLONOSCOPY  2009, 2006 , 2017   ESOPHAGOGASTRODUODENOSCOPY (EGD) WITH PROPOFOL N/A 02/17/2020   Procedure: ESOPHAGOGASTRODUODENOSCOPY (EGD) WITH PROPOFOL;  Surgeon: Lynann Bologna,  MD;  Location: [facility];  Service: Endoscopy;  Laterality: N/A;   IR ANGIOGRAM SELECTIVE EACH ADDITIONAL VESSEL  02/17/2020   IR ANGIOGRAM SELECTIVE EACH ADDITIONAL VESSEL  02/17/2020   IR ANGIOGRAM SELECTIVE EACH ADDITIONAL VESSEL  02/17/2020   IR ANGIOGRAM SELECTIVE EACH ADDITIONAL VESSEL  02/17/2020   IR ANGIOGRAM SELECTIVE EACH ADDITIONAL VESSEL  02/17/2020   IR ANGIOGRAM VISCERAL SELECTIVE  02/17/2020   IR ANGIOGRAM VISCERAL SELECTIVE  02/17/2020   IR US GUIDE VASC ACCESS RIGHT  02/17/2020   stapes surgery     Dr Dorma Russell   TONSILLECTOMY AND ADENOIDECTOMY     as a child    Allergies  Allergen Reactions   Penicillins Anaphylaxis   Bisphosphonates Other (See Comments)    [name]   Influenza Vaccines     Unknown   Simvastatin     Unknown    Trazodone And Nefazodone     Felt her throat was closing up/kept her awake   Sulfonamide Derivatives Rash and Other (See Comments)    blisters    Allergies as of 04/09/2021       Reactions   Penicillins Anaphylaxis   Bisphosphonates Other (See Comments)   [name]   Influenza Vaccines    Unknown   Simvastatin    Unknown    Trazodone And Nefazodone    Felt her throat was closing up/kept her awake   Sulfonamide Derivatives Rash, Other (See Comments)   blisters        Medication List        Accurate as of April 09, 2021 11:59 PM. If you have any questions, ask your nurse or doctor.          acetaminophen 325 MG tablet Commonly known as: TYLENOL Take 650 mg by mouth.   albuterol 108 (90 Base) MCG/ACT inhaler Commonly known as: VENTOLIN HFA Inhale into the lungs every 6 (six) hours as needed for wheezing or shortness of breath.   budesonide-formoterol 80-4.5 MCG/ACT inhaler Commonly known as: SYMBICORT Inhale 1 puff into the lungs daily.   calcium carbonate 750 MG chewable tablet Commonly known as: TUMS EX Chew 750 mg by mouth daily.   cholecalciferol 1000 units tablet Commonly known as: VITAMIN D Take  1,000 Units by mouth  daily.   cyclobenzaprine 5 MG tablet Commonly known as: FLEXERIL Take 2.5 mg by mouth at bedtime.   docusate sodium 100 MG capsule Commonly known as: COLACE Take 100 mg by mouth 2 (two) times daily.   ferrous sulfate 325 (65 FE) MG tablet Take 325 mg by mouth. Once A Day on Mon, Wed, Fri   hydrocortisone cream 1 % Apply 1 application topically 2 (two) times daily as needed (wrists).   hydrocortisone valerate cream 0.2 % Commonly known as: WESTCORT Apply 1 application topically 2 (two) times daily as needed (for itching). Cleanse behind ears with gentle cleanser and dry completely.   ketoconazole 2 % cream Commonly known as: NIZORAL Apply 1 application topically 2 (two) times daily as needed for irritation.   metoprolol tartrate 25 MG tablet Commonly known as: LOPRESSOR Take 12.5 mg by mouth 2 (two) times daily.   nitrofurantoin 50 MG capsule Commonly known as: MACRODANTIN Take 50 mg by mouth at bedtime.   Olopatadine HCl 0.2 % Soln Place 1 drop into both eyes daily.   polyethylene glycol 17 g packet Commonly known as: MIRALAX / GLYCOLAX Take 17 g by mouth 2 (two) times a week. Sunday and Wednesday   pregabalin 75 MG capsule Commonly known as: LYRICA Take 1 capsule (75 mg total) by mouth at bedtime.   Propylene Glycol 0.6 % Soln Place 1 drop into both eyes 4 (four) times daily.   vitamin B-12 1000 MCG tablet Commonly known as: CYANOCOBALAMIN Take 1,000 mcg by mouth daily.        Review of Systems  Constitutional:  Positive for fatigue and fever. Negative for appetite change.  HENT:  Positive for hearing loss. Negative for congestion and voice change.   Eyes:  Negative for visual disturbance.       C/o blurred vision, ? Color, ? Right eye peripheral vision loss.   Respiratory:  Positive for wheezing. Negative for cough.   Cardiovascular:  Positive for leg swelling.  Gastrointestinal:  Negative for abdominal pain and constipation.   Genitourinary:  Negative for dysuria and urgency.       Chronic, on and off  Musculoskeletal:  Positive for arthralgias, back pain, gait problem and myalgias.  Skin:  Negative for color change.  Neurological:  Negative for speech difficulty, weakness, light-headedness and headaches.  Psychiatric/Behavioral:  Negative for behavioral problems, hallucinations and sleep disturbance.        Chronic going to bed late, sleeping in late. Lethargic upon my examination.    Immunization History  Administered Date(s) Administered   Moderna SARS-COV2 Booster Vaccination 12/25/2019   Moderna Sars-Covid-2 Vaccination 02/17/2019, 03/17/2019   Pneumococcal Conjugate-13 09/01/2015   Pneumococcal Polysaccharide-23 07/07/1998   Td 08/21/2009   Pertinent  Health Maintenance Due  Topic Date Due   DEXA SCAN  Completed   INFLUENZA VACCINE  Discontinued   Fall Risk 03/18/2021 03/19/2021 03/19/2021 03/20/2021 03/20/2021  Falls in the past year? - - - - -  Was there an injury with Fall? - - - - -  Patient Fall Risk Level High fall risk High fall risk High fall risk High fall risk High fall risk  Patient at Risk for Falls Due to - - - - -   Functional Status Survey:    Vitals:   04/09/21 1006  BP: (!) 146/74  Pulse: 61  Temp: 100.3 F (37.9 C)  SpO2: 96%  Weight: 140 lb 12.8 oz (63.9 kg)  Height: 5\' 1"  (1.549 m)   Body mass index  is 26.6 kg/m. Physical Exam Vitals and nursing note reviewed.  Constitutional:      Comments: Lethargic  HENT:     Head: Normocephalic and atraumatic.     Nose: Nose normal.     Mouth/Throat:     Mouth: Mucous membranes are moist.  Eyes:     Extraocular Movements: Extraocular movements intact.     Conjunctiva/sclera: Conjunctivae normal.     Pupils: Pupils are equal, round, and reactive to light.     Comments: Able to count my fingers 3 feet away from her eyes.   Cardiovascular:     Rate and Rhythm: Normal rate and regular rhythm.     Heart sounds: No murmur  heard. Pulmonary:     Effort: Pulmonary effort is normal.     Breath sounds: Wheezing present. No rales.     Comments: Decreased air entry to both lungs. Diffused mild expiratory wheezes.  Abdominal:     General: Bowel sounds are normal.     Palpations: Abdomen is soft.     Tenderness: There is no abdominal tenderness.  Musculoskeletal:     Cervical back: Normal range of motion and neck supple.     Right lower leg: Edema present.     Left lower leg: Edema present.     Comments: Trace edema BLE  Skin:    General: Skin is warm and dry.  Neurological:     General: No focal deficit present.     Mental Status: She is alert. Mental status is at baseline.     Motor: No weakness.     Coordination: Coordination normal.     Gait: Gait abnormal.     Comments: Oriented to person, place.   Psychiatric:        Mood and Affect: Mood normal.        Behavior: Behavior normal.    Labs reviewed: Recent Labs    03/18/21 1039 03/20/21 0456 03/24/21 0000 03/26/21 0000  NA 136 135 140 140  K 3.9 3.2* 3.7 4.0  CL 101 106 107 107  CO2 26 22 26* 27*  GLUCOSE 143* 96  --   --   BUN 18 20 11 13   CREATININE 0.99 0.70 0.8 0.8  CALCIUM 9.7 8.1* 8.6* 8.9   Recent Labs    08/07/20 0000 02/05/21 0000 03/18/21 1039  AST 15 12* 22  ALT 10 9 15   ALKPHOS 78 69 87  BILITOT  --   --  0.9  PROT  --   --  7.7  ALBUMIN 3.6 3.6 4.5   Recent Labs    03/18/21 1039 03/20/21 0456 03/26/21 0000  WBC 7.2 7.8 5.5  NEUTROABS 6.2 6.0 3,641.00  HGB 13.7 11.1* 10.7*  HCT 41.3 34.1* 32*  MCV 92.2 93.7  --   PLT 204 145* 207   Lab Results  Component Value Date   TSH 0.661 03/19/2021   Lab Results  Component Value Date   HGBA1C 6.0 (H) 03/19/2021   Lab Results  Component Value Date   CHOL 192 03/19/2021   HDL 69 03/19/2021   LDLCALC 107 (H) 03/19/2021   LDLDIRECT 114.7 08/18/2011   TRIG 78 03/19/2021   CHOLHDL 2.8 03/19/2021    Significant Diagnostic Results in last 30 days:  CT ANGIO  HEAD W OR WO CONTRAST  Result Date: 03/19/2021 CLINICAL DATA:  Stroke suspected, transient aphasia EXAM: CT ANGIOGRAPHY HEAD AND NECK TECHNIQUE: Multidetector CT imaging of the head and neck was performed using the standard  protocol during bolus administration of intravenous contrast. Multiplanar CT image reconstructions and MIPs were obtained to evaluate the vascular anatomy. Carotid stenosis measurements (when applicable) are obtained utilizing NASCET criteria, using the distal internal carotid diameter as the denominator. RADIATION DOSE REDUCTION: This exam was performed according to the departmental dose-optimization program which includes automated exposure control, adjustment of the mA and/or kV according to patient size and/or use of iterative reconstruction technique. CONTRAST:  75mL OMNIPAQUE IOHEXOL 350 MG/ML SOLN COMPARISON:  No prior CTA, correlation is made with CT head 03/19/2021 FINDINGS: CT HEAD FINDINGS For noncontrast findings, please see same day CT head. CTA NECK FINDINGS Aortic arch: Two-vessel arch with a common origin of the brachiocephalic and left common carotid arteries. Imaged portion shows no evidence of aneurysm or dissection. No significant stenosis of the major arch vessel origins. Aortic atherosclerosis. Right carotid system: No evidence of dissection, stenosis (50% or greater) or occlusion. Calcifications at the bifurcation and in the proximal right ICA are not hemodynamically significant. Retropharyngeal course of the right common carotid artery. Left carotid system: No evidence of dissection, stenosis (50% or greater) or occlusion. Calcifications at the bifurcation and in the proximal left ICA are not hemodynamically significant. Vertebral arteries: Severe focal narrowing in the right V1 segment (series 7, image 240), possibly secondary to noncalcified plaque. The vertebral arteries are otherwise patent, without additional stenosis, dissection, or occlusion. Skeleton: Degenerative  changes in the cervical spine, with reversal of the normal cervical lordosis and anterolisthesis of C2 on C3 and C3 on C4, which appears degenerative. No acute osseous abnormality. Other neck: Negative. Upper chest: Severe emphysema. No focal pulmonary opacity or pleural effusion. Apical pleural-parenchymal scarring. Review of the MIP images confirms the above findings CTA HEAD FINDINGS Anterior circulation: Both internal carotid arteries are patent to the termini, with mild calcifications but without significant stenosis. A1 segments patent, with mild long segment narrowing of the proximal and mid right A1. Normal anterior communicating artery. Anterior cerebral arteries are patent to their distal aspects. No M1 stenosis or occlusion. Normal MCA bifurcations. Distal MCA branches are mildly irregular but perfused and symmetric. Posterior circulation: Vertebral arteries patent to the vertebrobasilar junction without stenosis. Posterior inferior cerebral arteries patent bilaterally. Basilar patent to its distal aspect. Superior cerebellar arteries patent bilaterally. Patent P1 segments. PCAs perfused to their distal aspects without stenosis. The right posterior communicating artery is patent. The left posterior communicating artery is not visualized. Venous sinuses: As permitted by contrast timing, patent. Anatomic variants: None significant Review of the MIP images confirms the above findings IMPRESSION: 1.  No intracranial large vessel occlusion or significant stenosis. 2. Severe focal narrowing in the right V1 segment, with otherwise patent right vertebral artery. No other hemodynamically significant stenosis in the neck. Electronically Signed   By: Wiliam Ke M.D.   On: 03/19/2021 23:23   CT HEAD WO CONTRAST  Result Date: 03/19/2021 CLINICAL DATA:  Follow-up stroke EXAM: CT HEAD WITHOUT CONTRAST TECHNIQUE: Contiguous axial images were obtained from the base of the skull through the vertex without intravenous  contrast. RADIATION DOSE REDUCTION: This exam was performed according to the departmental dose-optimization program which includes automated exposure control, adjustment of the mA and/or kV according to patient size and/or use of iterative reconstruction technique. COMPARISON:  None. FINDINGS: Brain: No evidence of acute infarction, hemorrhage, hydrocephalus, extra-axial collection or mass lesion/mass effect. Unchanged extensive periventricular and deep white matter hypodensity with mild global cerebral volume loss. Focal hypodensity of the right corona radiata is unchanged,  consistent with a small, strictly age indeterminate, although likely chronic lacunar infarction (series 2, image 18). Vascular: No hyperdense vessel or unexpected calcification. Skull: Normal. Negative for fracture or focal lesion. Sinuses/Orbits: No acute finding. Other: None. IMPRESSION: 1. No definite acute intracranial pathology. Focal hypodensity of the right corona radiata is unchanged, consistent with a small, strictly age indeterminate, although likely chronic lacunar infarction. 2. Unchanged extensive periventricular and deep white matter hypodensity with mild global cerebral volume loss, in keeping with advanced small vessel white matter disease. Electronically Signed   By: Jearld Lesch M.D.   On: 03/19/2021 10:42   CT Head Wo Contrast  Result Date: 03/18/2021 CLINICAL DATA:  Mental status change, unknown cause. Additional history provided: EXAM: CT HEAD WITHOUT CONTRAST TECHNIQUE: Contiguous axial images were obtained from the base of the skull through the vertex without intravenous contrast. RADIATION DOSE REDUCTION: This exam was performed according to the departmental dose-optimization program which includes automated exposure control, adjustment of the mA and/or kV according to patient size and/or use of iterative reconstruction technique. COMPARISON:  No pertinent prior exams available for comparison. FINDINGS: Brain:  Mild-to-moderate generalized cerebral atrophy. Small age-indeterminate infarct within the right corona radiata. Background moderate patchy and ill-defined hypoattenuation within the cerebral white matter, nonspecific but compatible with chronic small vessel ischemic disease. Chronic lacunar infarcts within the left basal ganglia and left thalamus. There is no acute intracranial hemorrhage. No demarcated cortical infarct. No extra-axial fluid collection. No evidence of an intracranial mass. No midline shift. Vascular: No hyperdense vessel. Atherosclerotic calcifications. Skull: Normal. Negative for fracture or focal lesion. Sinuses/Orbits: Visualized orbits show no acute finding. Trace mucosal thickening within the bilateral ethmoid sinuses. IMPRESSION: A small infarct within the right corona radiata is age-indeterminate (although favored subacute or chronic). A brain MRI may be obtained for further evaluation, as clinically warranted. Background moderate chronic small vessel ischemic changes within the cerebral white matter. Chronic lacunar infarcts within the left basal ganglia and left thalamus. Mild-to-moderate generalized cerebral atrophy. Electronically Signed   By: Jackey Loge D.O.   On: 03/18/2021 11:46   CT ANGIO NECK W OR WO CONTRAST  Result Date: 03/19/2021 CLINICAL DATA:  Stroke suspected, transient aphasia EXAM: CT ANGIOGRAPHY HEAD AND NECK TECHNIQUE: Multidetector CT imaging of the head and neck was performed using the standard protocol during bolus administration of intravenous contrast. Multiplanar CT image reconstructions and MIPs were obtained to evaluate the vascular anatomy. Carotid stenosis measurements (when applicable) are obtained utilizing NASCET criteria, using the distal internal carotid diameter as the denominator. RADIATION DOSE REDUCTION: This exam was performed according to the departmental dose-optimization program which includes automated exposure control, adjustment of the mA  and/or kV according to patient size and/or use of iterative reconstruction technique. CONTRAST:  75mL OMNIPAQUE IOHEXOL 350 MG/ML SOLN COMPARISON:  No prior CTA, correlation is made with CT head 03/19/2021 FINDINGS: CT HEAD FINDINGS For noncontrast findings, please see same day CT head. CTA NECK FINDINGS Aortic arch: Two-vessel arch with a common origin of the brachiocephalic and left common carotid arteries. Imaged portion shows no evidence of aneurysm or dissection. No significant stenosis of the major arch vessel origins. Aortic atherosclerosis. Right carotid system: No evidence of dissection, stenosis (50% or greater) or occlusion. Calcifications at the bifurcation and in the proximal right ICA are not hemodynamically significant. Retropharyngeal course of the right common carotid artery. Left carotid system: No evidence of dissection, stenosis (50% or greater) or occlusion. Calcifications at the bifurcation and in the proximal left  ICA are not hemodynamically significant. Vertebral arteries: Severe focal narrowing in the right V1 segment (series 7, image 240), possibly secondary to noncalcified plaque. The vertebral arteries are otherwise patent, without additional stenosis, dissection, or occlusion. Skeleton: Degenerative changes in the cervical spine, with reversal of the normal cervical lordosis and anterolisthesis of C2 on C3 and C3 on C4, which appears degenerative. No acute osseous abnormality. Other neck: Negative. Upper chest: Severe emphysema. No focal pulmonary opacity or pleural effusion. Apical pleural-parenchymal scarring. Review of the MIP images confirms the above findings CTA HEAD FINDINGS Anterior circulation: Both internal carotid arteries are patent to the termini, with mild calcifications but without significant stenosis. A1 segments patent, with mild long segment narrowing of the proximal and mid right A1. Normal anterior communicating artery. Anterior cerebral arteries are patent to their  distal aspects. No M1 stenosis or occlusion. Normal MCA bifurcations. Distal MCA branches are mildly irregular but perfused and symmetric. Posterior circulation: Vertebral arteries patent to the vertebrobasilar junction without stenosis. Posterior inferior cerebral arteries patent bilaterally. Basilar patent to its distal aspect. Superior cerebellar arteries patent bilaterally. Patent P1 segments. PCAs perfused to their distal aspects without stenosis. The right posterior communicating artery is patent. The left posterior communicating artery is not visualized. Venous sinuses: As permitted by contrast timing, patent. Anatomic variants: None significant Review of the MIP images confirms the above findings IMPRESSION: 1.  No intracranial large vessel occlusion or significant stenosis. 2. Severe focal narrowing in the right V1 segment, with otherwise patent right vertebral artery. No other hemodynamically significant stenosis in the neck. Electronically Signed   By: Wiliam Ke M.D.   On: 03/19/2021 23:23   EEG adult  Result Date: 03/19/2021 Charlsie Quest, MD     03/19/2021  8:36 AM Patient Name: ROZALIA FREGOSO MRN: [id number] Epilepsy Attending: Charlsie Quest Referring Physician/Provider: Teddy Spike, DO Date:03/18/2021 Duration: 22.21 mins Patient history: 88 year old female with altered mental status.  EEG to evaluate for seizure Level of alertness: Awake AEDs during EEG study: None Technical aspects: This EEG study was done with scalp electrodes positioned according to the 10-20 International system of electrode placement. Electrical activity was acquired at a sampling rate of 500Hz  and reviewed with a high frequency filter of 70Hz  and a low frequency filter of 1Hz . EEG data were recorded continuously and digitally stored. Description: EEG showed continuous generalized 3 to 6 Hz theta-delta slowing. Hyperventilation and photic stimulation were not performed.   ABNORMALITY - Continuous slow, generalized  IMPRESSION: This study is suggestive of moderate diffuse encephalopathy, nonspecific etiology. No seizures or epileptiform discharges were seen throughout the recording. Charlsie Quest   ECHOCARDIOGRAM COMPLETE  Result Date: 03/19/2021    ECHOCARDIOGRAM REPORT   Patient Name:   MAAYA BAGOT Date of Exam: 03/19/2021 Medical Rec #:  [id number]     Height:       61.0 in Accession #:    [id number]    Weight:       146.8 lb Date of Birth:  [birth year]     BSA:          1.656 m Patient Age:    89 years      BP:           94/52 mmHg Patient Gender: F             HR:           86 bpm. Exam Location:  Inpatient Procedure: 2D Echo, Cardiac Doppler and Color  Doppler Indications:    STROKE  History:        Patient has no prior history of Echocardiogram examinations.                 COPD; Risk Factors:Hypertension and Dyslipidemia.  Sonographer:    Festus [place] Referring Phys: [id number] TYRONE A KYLE IMPRESSIONS  1. Narrow LVOT with turbulent flow. No significant obstruction at rest. . Left ventricular ejection fraction, by estimation, is >75%. The left ventricle has hyperdynamic function. The left ventricle has no regional wall motion abnormalities. There is mild concentric left ventricular hypertrophy. Left ventricular diastolic parameters are consistent with Grade I diastolic dysfunction (impaired relaxation). Elevated left atrial pressure.  2. Right ventricular systolic function is normal. The right ventricular size is normal.  3. Left atrial size was moderately dilated.  4. Trivial mitral valve regurgitation. Severe mitral annular calcification.  5. The aortic valve is tricuspid. Aortic valve regurgitation is not visualized. Aortic valve sclerosis/calcification is present, without any evidence of aortic stenosis.  6. The inferior vena cava is normal in size with greater than 50% respiratory variability, suggesting right atrial pressure of 3 mmHg. FINDINGS  Left Ventricle: Narrow LVOT with turbulent flow. No significant  obstruction at rest. Left ventricular ejection fraction, by estimation, is >75%. The left ventricle has hyperdynamic function. The left ventricle has no regional wall motion abnormalities. The left ventricular internal cavity size was normal in size. There is mild concentric left ventricular hypertrophy. Left ventricular diastolic parameters are consistent with Grade I diastolic dysfunction (impaired relaxation). Elevated left atrial pressure. Right Ventricle: The right ventricular size is normal. Right vetricular wall thickness was not assessed. Right ventricular systolic function is normal. Left Atrium: Left atrial size was moderately dilated. Right Atrium: Right atrial size was normal in size. Pericardium: There is no evidence of pericardial effusion. Mitral Valve: There is mild thickening of the mitral valve leaflet(s). Severe mitral annular calcification. Trivial mitral valve regurgitation. MV peak gradient, 113.2 mmHg. The mean mitral valve gradient is 72.0 mmHg. Tricuspid Valve: The tricuspid valve is normal in structure. Tricuspid valve regurgitation is trivial. Aortic Valve: The aortic valve is tricuspid. Aortic valve regurgitation is not visualized. Aortic valve sclerosis/calcification is present, without any evidence of aortic stenosis. Aortic valve mean gradient measures 8.0 mmHg. Aortic valve peak gradient measures 13.4 mmHg. Aortic valve area, by VTI measures 2.26 cm. Pulmonic Valve: The pulmonic valve was not well visualized. Aorta: The aortic root is normal in size and structure. Venous: The inferior vena cava is normal in size with greater than 50% respiratory variability, suggesting right atrial pressure of 3 mmHg. IAS/Shunts: No atrial level shunt detected by color flow Doppler.  LEFT VENTRICLE PLAX 2D LVIDd:         4.39 cm     Diastology LVIDs:         3.00 cm     LV e' medial:    4.79 cm/s LV PW:         1.06 cm     LV E/e' medial:  25.9 LV IVS:        1.27 cm     LV e' lateral:   2.94 cm/s  LVOT diam:     1.70 cm     LV E/e' lateral: 42.2 LV SV:         77 LV SV Index:   47 LVOT Area:     2.27 cm  LV Volumes (MOD) LV vol d, MOD A2C: 21.1 ml LV vol  d, MOD A4C: 18.0 ml LV vol s, MOD A2C: 4.5 ml LV vol s, MOD A4C: 1.9 ml LV SV MOD A2C:     16.6 ml LV SV MOD A4C:     18.0 ml LV SV MOD BP:      16.8 ml RIGHT VENTRICLE             IVC RV S prime:     13.50 cm/s  IVC diam: 1.40 cm TAPSE (M-mode): 1.9 cm LEFT ATRIUM             Index        RIGHT ATRIUM          Index LA diam:        3.20 cm 1.93 cm/m   RA Area:     9.94 cm LA Vol (A2C):   60.3 ml 36.41 ml/m  RA Volume:   19.40 ml 11.71 ml/m LA Vol (A4C):   62.6 ml 37.80 ml/m LA Biplane Vol: 65.2 ml 39.37 ml/m  AORTIC VALVE AV Area (Vmax):    2.16 cm AV Area (Vmean):   1.91 cm AV Area (VTI):     2.26 cm AV Vmax:           183.00 cm/s AV Vmean:          133.000 cm/s AV VTI:            0.341 m AV Peak Grad:      13.4 mmHg AV Mean Grad:      8.0 mmHg LVOT Vmax:         174.00 cm/s LVOT Vmean:        112.000 cm/s LVOT VTI:          0.340 m LVOT/AV VTI ratio: 1.00  AORTA Ao Root diam: 2.80 cm MITRAL VALVE MV Area (PHT): 2.33 cm     SHUNTS MV Area VTI:   0.49 cm     Systemic VTI:  0.34 m MV Peak grad:  113.2 mmHg   Systemic Diam: 1.70 cm MV Mean grad:  72.0 mmHg MV Vmax:       5.32 m/s MV Vmean:      398.0 cm/s MV Decel Time: 325 msec MV E velocity: 124.00 cm/s MV A velocity: 172.00 cm/s MV E/A ratio:  0.72 Dietrich Pates MD Electronically signed by Dietrich Pates MD Signature Date/Time: 03/19/2021/1:47:59 PM    Final     Assessment/Plan Fever of unknown origin T99.9,  fever, lower back aches, wheezing, lethargy. Tested negative COVID. Will obtain CBC/diff, CMP/eGFR, UA C/S, CXR ap/lateral. Prn Tylenol.   Lethargy The patient is easily aroused, followed directions, answered questions appropriately, no new focal weakness, will obtain CXR ap/lateral, CBC/diff, CMP/eGFR, UA C/S. Will change Flexeril to prn.  04/09/21 wbc 5.2, Neutrophils 79.1, Hgb 11.5, Na  135, K 4.3, Bun 13, creat 0.99, eGFR 55  COPD (chronic obstructive pulmonary disease) with chronic bronchitis (HCC) Mild diffused wheezes,  takes Symbicort, prn Proair, adding DuoNeb q6h x 72 hours, obtain CXR ap/lateral.   Fibromyalgia Continue Lyrica  Macular degeneration, bilateral 03/27/21 [facility]: OU for Scotoma MD involving tcentral area. 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 TSH 0.661 03/19/21  Slow transit constipation takes MiraLax 2x/wk. Colace daily.   Prediabetes Hgb a1c 6.0 03/19/21  Hypokalemia K 4.0 03/26/21   GERD GERD/chronic gastritis/atrophic gastritis, GI bleed 02/2020, s/p EGD, per biopsy, takes Protonix. F/u GI prn  Chronic anemia Hx of Hgb 6.8 in hospital  s/p 2 units of PRBC,  Vit B12, Iron, Hgb 10.7 03/26/21  UTI (urinary tract infection) on  Nitrofurantoin 50mg  qd for UTI suppression, Dr. Vernie Ammons. Estrace vaginal cream for atrophic vaginitis.   HTN (hypertension) blood pressure is controlled on Metoprolol 12.5mg  bid. Bun/creat 13/0.8 03/26/21  Osteoarthritis on Flexeril 2.5mg  qhs, Lyrica 75mg  qd, but c/o blurred vision since Lyrica was increased to 75mg  qhs.   Pneumonia 04/09/21 CXR multifocal pneumonia, Levaquin 750mg  qd x 7 days started. Observe.      Family/ staff Communication: plan of care reviewed with the patient and charge nurse.   Labs/tests ordered:   CBC/diff, CMP/eGFR, UA C/S, CXR ap/lateral.   Time spend 40 minutes.

## 2021-04-10 DIAGNOSIS — J449 Chronic obstructive pulmonary disease, unspecified: Secondary | ICD-10-CM | POA: Diagnosis not present

## 2021-04-10 DIAGNOSIS — I10 Essential (primary) hypertension: Secondary | ICD-10-CM | POA: Diagnosis not present

## 2021-04-10 DIAGNOSIS — M6281 Muscle weakness (generalized): Secondary | ICD-10-CM | POA: Diagnosis not present

## 2021-04-10 DIAGNOSIS — J189 Pneumonia, unspecified organism: Secondary | ICD-10-CM | POA: Insufficient documentation

## 2021-04-10 DIAGNOSIS — R2681 Unsteadiness on feet: Secondary | ICD-10-CM | POA: Diagnosis not present

## 2021-04-10 DIAGNOSIS — I69398 Other sequelae of cerebral infarction: Secondary | ICD-10-CM | POA: Diagnosis not present

## 2021-04-10 DIAGNOSIS — R41841 Cognitive communication deficit: Secondary | ICD-10-CM | POA: Diagnosis not present

## 2021-04-10 NOTE — Assessment & Plan Note (Signed)
04/09/21 CXR multifocal pneumonia, Levaquin 750mg  qd x 7 days started. Observe.

## 2021-04-11 DIAGNOSIS — J449 Chronic obstructive pulmonary disease, unspecified: Secondary | ICD-10-CM | POA: Diagnosis not present

## 2021-04-11 DIAGNOSIS — M6281 Muscle weakness (generalized): Secondary | ICD-10-CM | POA: Diagnosis not present

## 2021-04-11 DIAGNOSIS — R41841 Cognitive communication deficit: Secondary | ICD-10-CM | POA: Diagnosis not present

## 2021-04-11 DIAGNOSIS — R2681 Unsteadiness on feet: Secondary | ICD-10-CM | POA: Diagnosis not present

## 2021-04-11 DIAGNOSIS — I69398 Other sequelae of cerebral infarction: Secondary | ICD-10-CM | POA: Diagnosis not present

## 2021-04-11 DIAGNOSIS — I10 Essential (primary) hypertension: Secondary | ICD-10-CM | POA: Diagnosis not present

## 2021-04-13 DIAGNOSIS — J449 Chronic obstructive pulmonary disease, unspecified: Secondary | ICD-10-CM | POA: Diagnosis not present

## 2021-04-13 DIAGNOSIS — M6281 Muscle weakness (generalized): Secondary | ICD-10-CM | POA: Diagnosis not present

## 2021-04-13 DIAGNOSIS — R41841 Cognitive communication deficit: Secondary | ICD-10-CM | POA: Diagnosis not present

## 2021-04-13 DIAGNOSIS — I69398 Other sequelae of cerebral infarction: Secondary | ICD-10-CM | POA: Diagnosis not present

## 2021-04-13 DIAGNOSIS — I10 Essential (primary) hypertension: Secondary | ICD-10-CM | POA: Diagnosis not present

## 2021-04-13 DIAGNOSIS — R2681 Unsteadiness on feet: Secondary | ICD-10-CM | POA: Diagnosis not present

## 2021-04-14 DIAGNOSIS — I10 Essential (primary) hypertension: Secondary | ICD-10-CM | POA: Diagnosis not present

## 2021-04-14 DIAGNOSIS — J449 Chronic obstructive pulmonary disease, unspecified: Secondary | ICD-10-CM | POA: Diagnosis not present

## 2021-04-14 DIAGNOSIS — R41841 Cognitive communication deficit: Secondary | ICD-10-CM | POA: Diagnosis not present

## 2021-04-14 DIAGNOSIS — R2681 Unsteadiness on feet: Secondary | ICD-10-CM | POA: Diagnosis not present

## 2021-04-14 DIAGNOSIS — M6281 Muscle weakness (generalized): Secondary | ICD-10-CM | POA: Diagnosis not present

## 2021-04-14 DIAGNOSIS — I69398 Other sequelae of cerebral infarction: Secondary | ICD-10-CM | POA: Diagnosis not present

## 2021-04-14 DIAGNOSIS — L814 Other melanin hyperpigmentation: Secondary | ICD-10-CM | POA: Diagnosis not present

## 2021-04-14 DIAGNOSIS — Z85828 Personal history of other malignant neoplasm of skin: Secondary | ICD-10-CM | POA: Diagnosis not present

## 2021-04-14 DIAGNOSIS — L570 Actinic keratosis: Secondary | ICD-10-CM | POA: Diagnosis not present

## 2021-04-14 DIAGNOSIS — L218 Other seborrheic dermatitis: Secondary | ICD-10-CM | POA: Diagnosis not present

## 2021-04-15 ENCOUNTER — Other Ambulatory Visit: Payer: Self-pay | Admitting: Orthopedic Surgery

## 2021-04-15 DIAGNOSIS — R29898 Other symptoms and signs involving the musculoskeletal system: Secondary | ICD-10-CM | POA: Diagnosis not present

## 2021-04-15 DIAGNOSIS — M159 Polyosteoarthritis, unspecified: Secondary | ICD-10-CM

## 2021-04-15 DIAGNOSIS — R41841 Cognitive communication deficit: Secondary | ICD-10-CM | POA: Diagnosis not present

## 2021-04-15 DIAGNOSIS — M6281 Muscle weakness (generalized): Secondary | ICD-10-CM | POA: Diagnosis not present

## 2021-04-15 DIAGNOSIS — R278 Other lack of coordination: Secondary | ICD-10-CM | POA: Diagnosis not present

## 2021-04-15 DIAGNOSIS — G629 Polyneuropathy, unspecified: Secondary | ICD-10-CM

## 2021-04-15 DIAGNOSIS — R2681 Unsteadiness on feet: Secondary | ICD-10-CM | POA: Diagnosis not present

## 2021-04-15 DIAGNOSIS — I10 Essential (primary) hypertension: Secondary | ICD-10-CM | POA: Diagnosis not present

## 2021-04-15 DIAGNOSIS — I69398 Other sequelae of cerebral infarction: Secondary | ICD-10-CM | POA: Diagnosis not present

## 2021-04-15 DIAGNOSIS — J449 Chronic obstructive pulmonary disease, unspecified: Secondary | ICD-10-CM | POA: Diagnosis not present

## 2021-04-15 DIAGNOSIS — I69998 Other sequelae following unspecified cerebrovascular disease: Secondary | ICD-10-CM | POA: Diagnosis not present

## 2021-04-15 MED ORDER — PREGABALIN 75 MG PO CAPS: 75.0000 mg | ORAL_CAPSULE | Freq: Every day | ORAL | 3 refills | Status: DC

## 2021-04-16 DIAGNOSIS — I10 Essential (primary) hypertension: Secondary | ICD-10-CM | POA: Diagnosis not present

## 2021-04-16 DIAGNOSIS — R2681 Unsteadiness on feet: Secondary | ICD-10-CM | POA: Diagnosis not present

## 2021-04-16 DIAGNOSIS — J449 Chronic obstructive pulmonary disease, unspecified: Secondary | ICD-10-CM | POA: Diagnosis not present

## 2021-04-16 DIAGNOSIS — R41841 Cognitive communication deficit: Secondary | ICD-10-CM | POA: Diagnosis not present

## 2021-04-16 DIAGNOSIS — M6281 Muscle weakness (generalized): Secondary | ICD-10-CM | POA: Diagnosis not present

## 2021-04-16 DIAGNOSIS — I69398 Other sequelae of cerebral infarction: Secondary | ICD-10-CM | POA: Diagnosis not present

## 2021-04-17 DIAGNOSIS — I69398 Other sequelae of cerebral infarction: Secondary | ICD-10-CM | POA: Diagnosis not present

## 2021-04-17 DIAGNOSIS — M6281 Muscle weakness (generalized): Secondary | ICD-10-CM | POA: Diagnosis not present

## 2021-04-17 DIAGNOSIS — J449 Chronic obstructive pulmonary disease, unspecified: Secondary | ICD-10-CM | POA: Diagnosis not present

## 2021-04-17 DIAGNOSIS — R41841 Cognitive communication deficit: Secondary | ICD-10-CM | POA: Diagnosis not present

## 2021-04-17 DIAGNOSIS — I10 Essential (primary) hypertension: Secondary | ICD-10-CM | POA: Diagnosis not present

## 2021-04-17 DIAGNOSIS — R2681 Unsteadiness on feet: Secondary | ICD-10-CM | POA: Diagnosis not present

## 2021-04-20 DIAGNOSIS — M6281 Muscle weakness (generalized): Secondary | ICD-10-CM | POA: Diagnosis not present

## 2021-04-20 DIAGNOSIS — R41841 Cognitive communication deficit: Secondary | ICD-10-CM | POA: Diagnosis not present

## 2021-04-20 DIAGNOSIS — I69398 Other sequelae of cerebral infarction: Secondary | ICD-10-CM | POA: Diagnosis not present

## 2021-04-20 DIAGNOSIS — I10 Essential (primary) hypertension: Secondary | ICD-10-CM | POA: Diagnosis not present

## 2021-04-20 DIAGNOSIS — J449 Chronic obstructive pulmonary disease, unspecified: Secondary | ICD-10-CM | POA: Diagnosis not present

## 2021-04-20 DIAGNOSIS — R2681 Unsteadiness on feet: Secondary | ICD-10-CM | POA: Diagnosis not present

## 2021-04-21 DIAGNOSIS — I10 Essential (primary) hypertension: Secondary | ICD-10-CM | POA: Diagnosis not present

## 2021-04-21 DIAGNOSIS — I69398 Other sequelae of cerebral infarction: Secondary | ICD-10-CM | POA: Diagnosis not present

## 2021-04-21 DIAGNOSIS — J449 Chronic obstructive pulmonary disease, unspecified: Secondary | ICD-10-CM | POA: Diagnosis not present

## 2021-04-21 DIAGNOSIS — M6281 Muscle weakness (generalized): Secondary | ICD-10-CM | POA: Diagnosis not present

## 2021-04-21 DIAGNOSIS — R41841 Cognitive communication deficit: Secondary | ICD-10-CM | POA: Diagnosis not present

## 2021-04-21 DIAGNOSIS — R2681 Unsteadiness on feet: Secondary | ICD-10-CM | POA: Diagnosis not present

## 2021-04-22 DIAGNOSIS — R2681 Unsteadiness on feet: Secondary | ICD-10-CM | POA: Diagnosis not present

## 2021-04-22 DIAGNOSIS — J449 Chronic obstructive pulmonary disease, unspecified: Secondary | ICD-10-CM | POA: Diagnosis not present

## 2021-04-22 DIAGNOSIS — M6281 Muscle weakness (generalized): Secondary | ICD-10-CM | POA: Diagnosis not present

## 2021-04-22 DIAGNOSIS — I69398 Other sequelae of cerebral infarction: Secondary | ICD-10-CM | POA: Diagnosis not present

## 2021-04-22 DIAGNOSIS — R41841 Cognitive communication deficit: Secondary | ICD-10-CM | POA: Diagnosis not present

## 2021-04-22 DIAGNOSIS — I10 Essential (primary) hypertension: Secondary | ICD-10-CM | POA: Diagnosis not present

## 2021-04-23 DIAGNOSIS — I69398 Other sequelae of cerebral infarction: Secondary | ICD-10-CM | POA: Diagnosis not present

## 2021-04-23 DIAGNOSIS — M6281 Muscle weakness (generalized): Secondary | ICD-10-CM | POA: Diagnosis not present

## 2021-04-23 DIAGNOSIS — J449 Chronic obstructive pulmonary disease, unspecified: Secondary | ICD-10-CM | POA: Diagnosis not present

## 2021-04-23 DIAGNOSIS — I10 Essential (primary) hypertension: Secondary | ICD-10-CM | POA: Diagnosis not present

## 2021-04-23 DIAGNOSIS — R41841 Cognitive communication deficit: Secondary | ICD-10-CM | POA: Diagnosis not present

## 2021-04-23 DIAGNOSIS — R2681 Unsteadiness on feet: Secondary | ICD-10-CM | POA: Diagnosis not present

## 2021-04-24 DIAGNOSIS — R2681 Unsteadiness on feet: Secondary | ICD-10-CM | POA: Diagnosis not present

## 2021-04-24 DIAGNOSIS — R41841 Cognitive communication deficit: Secondary | ICD-10-CM | POA: Diagnosis not present

## 2021-04-24 DIAGNOSIS — M6281 Muscle weakness (generalized): Secondary | ICD-10-CM | POA: Diagnosis not present

## 2021-04-24 DIAGNOSIS — I10 Essential (primary) hypertension: Secondary | ICD-10-CM | POA: Diagnosis not present

## 2021-04-24 DIAGNOSIS — J449 Chronic obstructive pulmonary disease, unspecified: Secondary | ICD-10-CM | POA: Diagnosis not present

## 2021-04-24 DIAGNOSIS — I69398 Other sequelae of cerebral infarction: Secondary | ICD-10-CM | POA: Diagnosis not present

## 2021-04-27 DIAGNOSIS — R2681 Unsteadiness on feet: Secondary | ICD-10-CM | POA: Diagnosis not present

## 2021-04-27 DIAGNOSIS — R41841 Cognitive communication deficit: Secondary | ICD-10-CM | POA: Diagnosis not present

## 2021-04-27 DIAGNOSIS — J449 Chronic obstructive pulmonary disease, unspecified: Secondary | ICD-10-CM | POA: Diagnosis not present

## 2021-04-27 DIAGNOSIS — I69398 Other sequelae of cerebral infarction: Secondary | ICD-10-CM | POA: Diagnosis not present

## 2021-04-27 DIAGNOSIS — I10 Essential (primary) hypertension: Secondary | ICD-10-CM | POA: Diagnosis not present

## 2021-04-27 DIAGNOSIS — M6281 Muscle weakness (generalized): Secondary | ICD-10-CM | POA: Diagnosis not present

## 2021-04-28 DIAGNOSIS — H3589 Other specified retinal disorders: Secondary | ICD-10-CM | POA: Diagnosis not present

## 2021-04-28 DIAGNOSIS — H47093 Other disorders of optic nerve, not elsewhere classified, bilateral: Secondary | ICD-10-CM | POA: Diagnosis not present

## 2021-04-28 DIAGNOSIS — H3562 Retinal hemorrhage, left eye: Secondary | ICD-10-CM | POA: Diagnosis not present

## 2021-04-28 DIAGNOSIS — R41841 Cognitive communication deficit: Secondary | ICD-10-CM | POA: Diagnosis not present

## 2021-04-28 DIAGNOSIS — D3131 Benign neoplasm of right choroid: Secondary | ICD-10-CM | POA: Diagnosis not present

## 2021-04-28 DIAGNOSIS — H35443 Age-related reticular degeneration of retina, bilateral: Secondary | ICD-10-CM | POA: Diagnosis not present

## 2021-04-28 DIAGNOSIS — J449 Chronic obstructive pulmonary disease, unspecified: Secondary | ICD-10-CM | POA: Diagnosis not present

## 2021-04-28 DIAGNOSIS — R2681 Unsteadiness on feet: Secondary | ICD-10-CM | POA: Diagnosis not present

## 2021-04-28 DIAGNOSIS — H353231 Exudative age-related macular degeneration, bilateral, with active choroidal neovascularization: Secondary | ICD-10-CM | POA: Diagnosis not present

## 2021-04-28 DIAGNOSIS — M6281 Muscle weakness (generalized): Secondary | ICD-10-CM | POA: Diagnosis not present

## 2021-04-28 DIAGNOSIS — I69398 Other sequelae of cerebral infarction: Secondary | ICD-10-CM | POA: Diagnosis not present

## 2021-04-28 DIAGNOSIS — I10 Essential (primary) hypertension: Secondary | ICD-10-CM | POA: Diagnosis not present

## 2021-04-29 DIAGNOSIS — I10 Essential (primary) hypertension: Secondary | ICD-10-CM | POA: Diagnosis not present

## 2021-04-29 DIAGNOSIS — R41841 Cognitive communication deficit: Secondary | ICD-10-CM | POA: Diagnosis not present

## 2021-04-29 DIAGNOSIS — J449 Chronic obstructive pulmonary disease, unspecified: Secondary | ICD-10-CM | POA: Diagnosis not present

## 2021-04-29 DIAGNOSIS — I69398 Other sequelae of cerebral infarction: Secondary | ICD-10-CM | POA: Diagnosis not present

## 2021-04-29 DIAGNOSIS — M6281 Muscle weakness (generalized): Secondary | ICD-10-CM | POA: Diagnosis not present

## 2021-04-29 DIAGNOSIS — R2681 Unsteadiness on feet: Secondary | ICD-10-CM | POA: Diagnosis not present

## 2021-04-30 DIAGNOSIS — R2681 Unsteadiness on feet: Secondary | ICD-10-CM | POA: Diagnosis not present

## 2021-04-30 DIAGNOSIS — R41841 Cognitive communication deficit: Secondary | ICD-10-CM | POA: Diagnosis not present

## 2021-04-30 DIAGNOSIS — J449 Chronic obstructive pulmonary disease, unspecified: Secondary | ICD-10-CM | POA: Diagnosis not present

## 2021-04-30 DIAGNOSIS — M6281 Muscle weakness (generalized): Secondary | ICD-10-CM | POA: Diagnosis not present

## 2021-04-30 DIAGNOSIS — I10 Essential (primary) hypertension: Secondary | ICD-10-CM | POA: Diagnosis not present

## 2021-04-30 DIAGNOSIS — I69398 Other sequelae of cerebral infarction: Secondary | ICD-10-CM | POA: Diagnosis not present

## 2021-05-01 DIAGNOSIS — I10 Essential (primary) hypertension: Secondary | ICD-10-CM | POA: Diagnosis not present

## 2021-05-01 DIAGNOSIS — R2681 Unsteadiness on feet: Secondary | ICD-10-CM | POA: Diagnosis not present

## 2021-05-01 DIAGNOSIS — J449 Chronic obstructive pulmonary disease, unspecified: Secondary | ICD-10-CM | POA: Diagnosis not present

## 2021-05-01 DIAGNOSIS — M6281 Muscle weakness (generalized): Secondary | ICD-10-CM | POA: Diagnosis not present

## 2021-05-01 DIAGNOSIS — R41841 Cognitive communication deficit: Secondary | ICD-10-CM | POA: Diagnosis not present

## 2021-05-01 DIAGNOSIS — I69398 Other sequelae of cerebral infarction: Secondary | ICD-10-CM | POA: Diagnosis not present

## 2021-05-04 DIAGNOSIS — R41841 Cognitive communication deficit: Secondary | ICD-10-CM | POA: Diagnosis not present

## 2021-05-04 DIAGNOSIS — M6281 Muscle weakness (generalized): Secondary | ICD-10-CM | POA: Diagnosis not present

## 2021-05-04 DIAGNOSIS — I10 Essential (primary) hypertension: Secondary | ICD-10-CM | POA: Diagnosis not present

## 2021-05-04 DIAGNOSIS — J449 Chronic obstructive pulmonary disease, unspecified: Secondary | ICD-10-CM | POA: Diagnosis not present

## 2021-05-04 DIAGNOSIS — R2681 Unsteadiness on feet: Secondary | ICD-10-CM | POA: Diagnosis not present

## 2021-05-04 DIAGNOSIS — I69398 Other sequelae of cerebral infarction: Secondary | ICD-10-CM | POA: Diagnosis not present

## 2021-05-05 DIAGNOSIS — R41841 Cognitive communication deficit: Secondary | ICD-10-CM | POA: Diagnosis not present

## 2021-05-05 DIAGNOSIS — R2681 Unsteadiness on feet: Secondary | ICD-10-CM | POA: Diagnosis not present

## 2021-05-05 DIAGNOSIS — I69398 Other sequelae of cerebral infarction: Secondary | ICD-10-CM | POA: Diagnosis not present

## 2021-05-05 DIAGNOSIS — I10 Essential (primary) hypertension: Secondary | ICD-10-CM | POA: Diagnosis not present

## 2021-05-05 DIAGNOSIS — J449 Chronic obstructive pulmonary disease, unspecified: Secondary | ICD-10-CM | POA: Diagnosis not present

## 2021-05-05 DIAGNOSIS — M6281 Muscle weakness (generalized): Secondary | ICD-10-CM | POA: Diagnosis not present

## 2021-05-06 DIAGNOSIS — R41841 Cognitive communication deficit: Secondary | ICD-10-CM | POA: Diagnosis not present

## 2021-05-06 DIAGNOSIS — J449 Chronic obstructive pulmonary disease, unspecified: Secondary | ICD-10-CM | POA: Diagnosis not present

## 2021-05-06 DIAGNOSIS — M6281 Muscle weakness (generalized): Secondary | ICD-10-CM | POA: Diagnosis not present

## 2021-05-06 DIAGNOSIS — R2681 Unsteadiness on feet: Secondary | ICD-10-CM | POA: Diagnosis not present

## 2021-05-06 DIAGNOSIS — I69398 Other sequelae of cerebral infarction: Secondary | ICD-10-CM | POA: Diagnosis not present

## 2021-05-06 DIAGNOSIS — I10 Essential (primary) hypertension: Secondary | ICD-10-CM | POA: Diagnosis not present

## 2021-05-07 DIAGNOSIS — R41841 Cognitive communication deficit: Secondary | ICD-10-CM | POA: Diagnosis not present

## 2021-05-07 DIAGNOSIS — M6281 Muscle weakness (generalized): Secondary | ICD-10-CM | POA: Diagnosis not present

## 2021-05-07 DIAGNOSIS — I69398 Other sequelae of cerebral infarction: Secondary | ICD-10-CM | POA: Diagnosis not present

## 2021-05-07 DIAGNOSIS — R2681 Unsteadiness on feet: Secondary | ICD-10-CM | POA: Diagnosis not present

## 2021-05-07 DIAGNOSIS — J449 Chronic obstructive pulmonary disease, unspecified: Secondary | ICD-10-CM | POA: Diagnosis not present

## 2021-05-07 DIAGNOSIS — I10 Essential (primary) hypertension: Secondary | ICD-10-CM | POA: Diagnosis not present

## 2021-05-08 DIAGNOSIS — I10 Essential (primary) hypertension: Secondary | ICD-10-CM | POA: Diagnosis not present

## 2021-05-08 DIAGNOSIS — M6281 Muscle weakness (generalized): Secondary | ICD-10-CM | POA: Diagnosis not present

## 2021-05-08 DIAGNOSIS — J449 Chronic obstructive pulmonary disease, unspecified: Secondary | ICD-10-CM | POA: Diagnosis not present

## 2021-05-08 DIAGNOSIS — I69398 Other sequelae of cerebral infarction: Secondary | ICD-10-CM | POA: Diagnosis not present

## 2021-05-08 DIAGNOSIS — R2681 Unsteadiness on feet: Secondary | ICD-10-CM | POA: Diagnosis not present

## 2021-05-08 DIAGNOSIS — R41841 Cognitive communication deficit: Secondary | ICD-10-CM | POA: Diagnosis not present

## 2021-05-11 DIAGNOSIS — M6281 Muscle weakness (generalized): Secondary | ICD-10-CM | POA: Diagnosis not present

## 2021-05-11 DIAGNOSIS — J449 Chronic obstructive pulmonary disease, unspecified: Secondary | ICD-10-CM | POA: Diagnosis not present

## 2021-05-11 DIAGNOSIS — I10 Essential (primary) hypertension: Secondary | ICD-10-CM | POA: Diagnosis not present

## 2021-05-11 DIAGNOSIS — R2681 Unsteadiness on feet: Secondary | ICD-10-CM | POA: Diagnosis not present

## 2021-05-11 DIAGNOSIS — I69398 Other sequelae of cerebral infarction: Secondary | ICD-10-CM | POA: Diagnosis not present

## 2021-05-11 DIAGNOSIS — R41841 Cognitive communication deficit: Secondary | ICD-10-CM | POA: Diagnosis not present

## 2021-05-12 DIAGNOSIS — M6281 Muscle weakness (generalized): Secondary | ICD-10-CM | POA: Diagnosis not present

## 2021-05-12 DIAGNOSIS — I10 Essential (primary) hypertension: Secondary | ICD-10-CM | POA: Diagnosis not present

## 2021-05-12 DIAGNOSIS — R41841 Cognitive communication deficit: Secondary | ICD-10-CM | POA: Diagnosis not present

## 2021-05-12 DIAGNOSIS — I69398 Other sequelae of cerebral infarction: Secondary | ICD-10-CM | POA: Diagnosis not present

## 2021-05-12 DIAGNOSIS — R2681 Unsteadiness on feet: Secondary | ICD-10-CM | POA: Diagnosis not present

## 2021-05-12 DIAGNOSIS — J449 Chronic obstructive pulmonary disease, unspecified: Secondary | ICD-10-CM | POA: Diagnosis not present

## 2021-05-13 ENCOUNTER — Non-Acute Institutional Stay: Payer: Medicare Other | Admitting: Orthopedic Surgery

## 2021-05-13 ENCOUNTER — Encounter: Payer: Self-pay | Admitting: Orthopedic Surgery

## 2021-05-13 DIAGNOSIS — J449 Chronic obstructive pulmonary disease, unspecified: Secondary | ICD-10-CM | POA: Diagnosis not present

## 2021-05-13 DIAGNOSIS — K219 Gastro-esophageal reflux disease without esophagitis: Secondary | ICD-10-CM

## 2021-05-13 DIAGNOSIS — R062 Wheezing: Secondary | ICD-10-CM | POA: Diagnosis not present

## 2021-05-13 DIAGNOSIS — R41841 Cognitive communication deficit: Secondary | ICD-10-CM | POA: Diagnosis not present

## 2021-05-13 DIAGNOSIS — J189 Pneumonia, unspecified organism: Secondary | ICD-10-CM

## 2021-05-13 DIAGNOSIS — D649 Anemia, unspecified: Secondary | ICD-10-CM | POA: Diagnosis not present

## 2021-05-13 DIAGNOSIS — M6281 Muscle weakness (generalized): Secondary | ICD-10-CM | POA: Diagnosis not present

## 2021-05-13 DIAGNOSIS — I10 Essential (primary) hypertension: Secondary | ICD-10-CM | POA: Diagnosis not present

## 2021-05-13 DIAGNOSIS — M797 Fibromyalgia: Secondary | ICD-10-CM | POA: Diagnosis not present

## 2021-05-13 DIAGNOSIS — J9809 Other diseases of bronchus, not elsewhere classified: Secondary | ICD-10-CM | POA: Diagnosis not present

## 2021-05-13 DIAGNOSIS — R7303 Prediabetes: Secondary | ICD-10-CM | POA: Diagnosis not present

## 2021-05-13 DIAGNOSIS — R2681 Unsteadiness on feet: Secondary | ICD-10-CM | POA: Diagnosis not present

## 2021-05-13 DIAGNOSIS — I69398 Other sequelae of cerebral infarction: Secondary | ICD-10-CM | POA: Diagnosis not present

## 2021-05-13 NOTE — Progress Notes (Signed)
?Location:  [facility] ?Nursing Home Room Number: [id number] ?Place of Service:  ALF (13) ?Provider: Yvonna Alanis, NP ? ?Patient Care Team: ?Mast, Man X, NP as PCP - General (Internal Medicine) ?Mast, Man X, NP as Nurse Practitioner (Internal Medicine) ? ?Extended Emergency Contact Information ?Primary Emergency Contact: Cino,Jeff ?Home Phone: [phone] ?Relation: Son ?Secondary Emergency Contact: Culbreth Sr.,Christopher ?Home Phone: [phone] ?Relation: Relative ? ?Code Status:  DNR ?Goals of care: Advanced Directive information ? ?  05/13/2021  ?  1:36 PM  ?Advanced Directives  ?Does Patient Have a Medical Advance Directive? Yes  ?Type of Advance Directive Out of facility DNR (pink MOST or yellow form);Living will  ?Does patient want to make changes to medical advance directive? No - Patient declined  ? ? ? ?Chief Complaint  ?Patient presents with  ? Acute Visit  ?  Wheezing  ? ? ?HPI:  ?Pt is a 88 y.o. female seen today for an acute visit for wheezing.  ? ?Wheezing began 03/28. She was given albuterol inhaler and Claritin, symptoms subsided. She also takes Symbicort daily for COPD.Wheezing returned this morning. O2 sats > 95% on RA. She denies chest pain or sob. 02/23 she was diagnosed with pneumonia, given Levaquin x 7 days.  ? ?HTN- BUN/creat 13/1.0 04/09/2021, remains on metoprolol ?Prediabetes- A1c 6.0 03/19/2021, not on medication at this time ?GERD- hgb 11.5 04/09/2020, h/o GI bleed 02/2020, remains on Protonix ?Anemia- see above, remains on ferrous sulfate M/W/F ?Fibromyalgia- remains on Lyrica ? ? ?Past Medical History:  ?Diagnosis Date  ? Acute blood loss anemia 08/23/2013  ? 04/13/16 TSH 1.20, Na 141, K 4.2, Bun 15, creat 0.79, wbc 5.5, Hgb 11.5, plt 283  ? Anxiety   ? Anxiety state 07/02/2007  ? Qualifier: Diagnosis of  By: Lenna Gilford MD, Deborra Medina   ? Asthma   ? 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 Cigarette smoker   ? Colon polyps 2009  ? TWO TUBULAR ADENOMAS AND HYPERPLASTIC POLYPS   ? Constipation 09/14/2013  ? COPD (chronic obstructive pulmonary disease) ([place])   ? Diverticulosis of colon   ? DJD (degenerative joint disease)   ? Dog bite of limb 07/14/2010  ? Dog bite 07/10/10 - dogs shots utd Last td was 08/2009  Localized tx only.    ? Fibromyalgia   ? GERD 12/19/2006  ? Qualifier: Diagnosis of  By: Julien Girt CMA, Leigh  04/13/16 TSH 1.20, Na 141, K 4.2, Bun 15, creat 0.79, wbc 5.5, Hgb 11.5, plt 283   ? GERD (gastroesophageal reflux disease)   ? Hammer toe of second toe of left foot 04/08/2016  ? Left 2nd  ? Hearing loss   ? Hemorrhoids   ? Hypercholesterolemia   ? Hypertension   ? Osteoarthritis 12/19/2006  ? Qualifier: Diagnosis of  By: Julien Girt CMA, Leigh    ? Osteoporosis   ? ?Past Surgical History:  ?Procedure Laterality Date  ? BIOPSY  02/17/2020  ? Procedure: BIOPSY;  Surgeon: Jackquline Denmark, MD;  Location: [facility];  Service: Endoscopy;;  ? CATARACT EXTRACTION, BILATERAL  2009  ? COLONOSCOPY  2009, 2006 , 2017  ? ESOPHAGOGASTRODUODENOSCOPY (EGD) WITH PROPOFOL N/A 02/17/2020  ? Procedure: ESOPHAGOGASTRODUODENOSCOPY (EGD) WITH PROPOFOL;  Surgeon: Jackquline Denmark, MD;  Location: [facility];  Service: Endoscopy;  Laterality: N/A;  ? IR ANGIOGRAM SELECTIVE EACH ADDITIONAL VESSEL  02/17/2020  ? IR ANGIOGRAM SELECTIVE EACH ADDITIONAL VESSEL  02/17/2020  ? IR ANGIOGRAM SELECTIVE EACH ADDITIONAL VESSEL  02/17/2020  ? [place]  ADDITIONAL VESSEL  02/17/2020  ? IR ANGIOGRAM SELECTIVE EACH ADDITIONAL VESSEL  02/17/2020  ? IR ANGIOGRAM VISCERAL SELECTIVE  02/17/2020  ? IR ANGIOGRAM VISCERAL SELECTIVE  02/17/2020  ? IR US GUIDE VASC ACCESS RIGHT  02/17/2020  ? stapes surgery    ? Dr Thornell Mule  ? TONSILLECTOMY AND ADENOIDECTOMY    ? as a child  ? ? ?Allergies  ?Allergen Reactions  ? Penicillins Anaphylaxis  ? Bisphosphonates Other (See Comments)  ?  [name]  ? Influenza Vaccines   ?  Unknown  ? Simvastatin   ?  Unknown   ? Trazodone And Nefazodone   ?  Felt her throat was closing up/kept her awake  ?  Sulfonamide Derivatives Rash and Other (See Comments)  ?  blisters  ? ? ?Outpatient Encounter Medications as of 05/13/2021  ?Medication Sig  ? albuterol (VENTOLIN HFA) 108 (90 Base) MCG/ACT inhaler Inhale into the lungs every 6 (six) hours as needed for wheezing or shortness of breath.  ? budesonide-formoterol (SYMBICORT) 80-4.5 MCG/ACT inhaler Inhale 1 puff into the lungs daily.  ? calcium carbonate (TUMS EX) 750 MG chewable tablet Chew 750 mg by mouth daily.   ? cholecalciferol (VITAMIN D) 1000 UNITS tablet Take 1,000 Units by mouth daily.  ? cyclobenzaprine (FLEXERIL) 5 MG tablet Take 2.5 mg by mouth at bedtime.  ? docusate sodium (COLACE) 100 MG capsule Take 100 mg by mouth 2 (two) times daily.  ? ferrous sulfate 325 (65 FE) MG tablet Take 325 mg by mouth. Once A Day on Mon, Wed, Fri  ? hydrocortisone cream 1 % Apply 1 application topically 2 (two) times daily as needed (wrists).  ? hydrocortisone valerate cream (WESTCORT) 0.2 % Apply 1 application topically 2 (two) times daily as needed (for itching). Cleanse behind ears with gentle cleanser and dry completely.  ? ketoconazole (NIZORAL) 2 % cream Apply 1 application topically 2 (two) times daily as needed for irritation.  ? loratadine (CLARITIN) 10 MG tablet Take 10 mg by mouth daily as needed for allergies.  ? metoprolol tartrate (LOPRESSOR) 25 MG tablet Take 12.5 mg by mouth 2 (two) times daily.  ? nitrofurantoin (MACRODANTIN) 50 MG capsule Take 50 mg by mouth at bedtime.  ? Olopatadine HCl 0.2 % SOLN Place 1 drop into both eyes daily.  ? polyethylene glycol (MIRALAX / GLYCOLAX) 17 g packet Take 17 g by mouth 2 (two) times a week. Sunday and Wednesday  ? pregabalin (LYRICA) 75 MG capsule Take 1 capsule (75 mg total) by mouth at bedtime.  ? Propylene Glycol 0.6 % SOLN Place 1 drop into both eyes 4 (four) times daily.  ? vitamin B-12 (CYANOCOBALAMIN) 1000 MCG tablet Take 1,000 mcg by mouth daily.  ? ?No facility-administered encounter medications on file as of  05/13/2021.  ? ? ?Review of Systems  ?Constitutional:  Negative for activity change, chills, fatigue and fever.  ?HENT:  Positive for rhinorrhea. Negative for congestion and trouble swallowing.   ?Eyes:  Negative for visual disturbance.  ?Respiratory:  Positive for wheezing. Negative for cough and shortness of breath.   ?Cardiovascular:  Negative for chest pain and leg swelling.  ?Gastrointestinal:  Negative for abdominal distention, abdominal pain, constipation, diarrhea and nausea.  ?Genitourinary:  Negative for dysuria, frequency and hematuria.  ?Musculoskeletal:  Positive for arthralgias and gait problem.  ?Skin:  Negative for wound.  ?Neurological:  Negative for dizziness and headaches.  ?Psychiatric/Behavioral:  Negative for confusion, dysphoric mood and sleep disturbance. The patient is not  nervous/anxious.   ? ?Immunization History  ?Administered Date(s) Administered  ? Moderna SARS-COV2 Booster Vaccination 12/25/2019  ? Moderna Sars-Covid-2 Vaccination 02/17/2019, 03/17/2019  ? Pneumococcal Conjugate-13 09/01/2015  ? Pneumococcal Polysaccharide-23 07/07/1998  ? Td 08/21/2009  ? ?Pertinent  Health Maintenance Due  ?Topic Date Due  ? DEXA SCAN  Completed  ? INFLUENZA VACCINE  Discontinued  ? ? ?  03/18/2021  ? 10:52 AM 03/19/2021  ?  3:00 AM 03/19/2021  ?  8:00 AM 03/20/2021  ? 12:00 AM 03/20/2021  ? 12:00 PM  ?Fall Risk  ?Patient Fall Risk Level High fall risk High fall risk High fall risk High fall risk High fall risk  ? ?Functional Status Survey: ?  ? ?Vitals:  ? 05/13/21 1331  ?BP: 132/66  ?Pulse: 72  ?Resp: 18  ?Temp: 98.2 ?F (36.8 ?C)  ?SpO2: 95%  ?Weight: 139 lb (63 kg)  ?Height: '5\' 1"'$  (1.549 m)  ? ?Body mass index is 26.26 kg/m?Marland Kitchen ?Physical Exam ?Vitals reviewed.  ?Constitutional:   ?   General: She is not in acute distress. ?HENT:  ?   Head: Normocephalic.  ?   Nose: Rhinorrhea present.  ?   Mouth/Throat:  ?   Mouth: Mucous membranes are dry.  ?Eyes:  ?   General:     ?   Right eye: No discharge.     ?   Left  eye: No discharge.  ?Cardiovascular:  ?   Rate and Rhythm: Normal rate and regular rhythm.  ?   Pulses: Normal pulses.  ?   Heart sounds: Normal heart sounds.  ?Pulmonary:  ?   Effort: Pulmonary effort is n

## 2021-05-14 DIAGNOSIS — I10 Essential (primary) hypertension: Secondary | ICD-10-CM | POA: Diagnosis not present

## 2021-05-14 DIAGNOSIS — R41841 Cognitive communication deficit: Secondary | ICD-10-CM | POA: Diagnosis not present

## 2021-05-14 DIAGNOSIS — R2681 Unsteadiness on feet: Secondary | ICD-10-CM | POA: Diagnosis not present

## 2021-05-14 DIAGNOSIS — J449 Chronic obstructive pulmonary disease, unspecified: Secondary | ICD-10-CM | POA: Diagnosis not present

## 2021-05-14 DIAGNOSIS — M6281 Muscle weakness (generalized): Secondary | ICD-10-CM | POA: Diagnosis not present

## 2021-05-14 DIAGNOSIS — I69398 Other sequelae of cerebral infarction: Secondary | ICD-10-CM | POA: Diagnosis not present

## 2021-05-15 DIAGNOSIS — I69398 Other sequelae of cerebral infarction: Secondary | ICD-10-CM | POA: Diagnosis not present

## 2021-05-15 DIAGNOSIS — R2681 Unsteadiness on feet: Secondary | ICD-10-CM | POA: Diagnosis not present

## 2021-05-15 DIAGNOSIS — J449 Chronic obstructive pulmonary disease, unspecified: Secondary | ICD-10-CM | POA: Diagnosis not present

## 2021-05-15 DIAGNOSIS — R41841 Cognitive communication deficit: Secondary | ICD-10-CM | POA: Diagnosis not present

## 2021-05-15 DIAGNOSIS — I10 Essential (primary) hypertension: Secondary | ICD-10-CM | POA: Diagnosis not present

## 2021-05-15 DIAGNOSIS — M6281 Muscle weakness (generalized): Secondary | ICD-10-CM | POA: Diagnosis not present

## 2021-05-18 DIAGNOSIS — J449 Chronic obstructive pulmonary disease, unspecified: Secondary | ICD-10-CM | POA: Diagnosis not present

## 2021-05-18 DIAGNOSIS — I69998 Other sequelae following unspecified cerebrovascular disease: Secondary | ICD-10-CM | POA: Diagnosis not present

## 2021-05-18 DIAGNOSIS — H53411 Scotoma involving central area, right eye: Secondary | ICD-10-CM | POA: Diagnosis not present

## 2021-05-18 DIAGNOSIS — M6281 Muscle weakness (generalized): Secondary | ICD-10-CM | POA: Diagnosis not present

## 2021-05-18 DIAGNOSIS — R278 Other lack of coordination: Secondary | ICD-10-CM | POA: Diagnosis not present

## 2021-05-18 DIAGNOSIS — R29898 Other symptoms and signs involving the musculoskeletal system: Secondary | ICD-10-CM | POA: Diagnosis not present

## 2021-05-18 DIAGNOSIS — R41841 Cognitive communication deficit: Secondary | ICD-10-CM | POA: Diagnosis not present

## 2021-05-18 DIAGNOSIS — R2681 Unsteadiness on feet: Secondary | ICD-10-CM | POA: Diagnosis not present

## 2021-05-18 DIAGNOSIS — H353231 Exudative age-related macular degeneration, bilateral, with active choroidal neovascularization: Secondary | ICD-10-CM | POA: Diagnosis not present

## 2021-05-18 DIAGNOSIS — I69398 Other sequelae of cerebral infarction: Secondary | ICD-10-CM | POA: Diagnosis not present

## 2021-05-18 DIAGNOSIS — I10 Essential (primary) hypertension: Secondary | ICD-10-CM | POA: Diagnosis not present

## 2021-05-19 DIAGNOSIS — M6281 Muscle weakness (generalized): Secondary | ICD-10-CM | POA: Diagnosis not present

## 2021-05-19 DIAGNOSIS — I69398 Other sequelae of cerebral infarction: Secondary | ICD-10-CM | POA: Diagnosis not present

## 2021-05-19 DIAGNOSIS — R41841 Cognitive communication deficit: Secondary | ICD-10-CM | POA: Diagnosis not present

## 2021-05-19 DIAGNOSIS — R2681 Unsteadiness on feet: Secondary | ICD-10-CM | POA: Diagnosis not present

## 2021-05-19 DIAGNOSIS — J449 Chronic obstructive pulmonary disease, unspecified: Secondary | ICD-10-CM | POA: Diagnosis not present

## 2021-05-19 DIAGNOSIS — I10 Essential (primary) hypertension: Secondary | ICD-10-CM | POA: Diagnosis not present

## 2021-05-20 DIAGNOSIS — R2681 Unsteadiness on feet: Secondary | ICD-10-CM | POA: Diagnosis not present

## 2021-05-20 DIAGNOSIS — R41841 Cognitive communication deficit: Secondary | ICD-10-CM | POA: Diagnosis not present

## 2021-05-20 DIAGNOSIS — I69398 Other sequelae of cerebral infarction: Secondary | ICD-10-CM | POA: Diagnosis not present

## 2021-05-20 DIAGNOSIS — J449 Chronic obstructive pulmonary disease, unspecified: Secondary | ICD-10-CM | POA: Diagnosis not present

## 2021-05-20 DIAGNOSIS — I10 Essential (primary) hypertension: Secondary | ICD-10-CM | POA: Diagnosis not present

## 2021-05-20 DIAGNOSIS — M6281 Muscle weakness (generalized): Secondary | ICD-10-CM | POA: Diagnosis not present

## 2021-05-21 DIAGNOSIS — R2681 Unsteadiness on feet: Secondary | ICD-10-CM | POA: Diagnosis not present

## 2021-05-21 DIAGNOSIS — J449 Chronic obstructive pulmonary disease, unspecified: Secondary | ICD-10-CM | POA: Diagnosis not present

## 2021-05-21 DIAGNOSIS — M6281 Muscle weakness (generalized): Secondary | ICD-10-CM | POA: Diagnosis not present

## 2021-05-21 DIAGNOSIS — I10 Essential (primary) hypertension: Secondary | ICD-10-CM | POA: Diagnosis not present

## 2021-05-21 DIAGNOSIS — R41841 Cognitive communication deficit: Secondary | ICD-10-CM | POA: Diagnosis not present

## 2021-05-21 DIAGNOSIS — I69398 Other sequelae of cerebral infarction: Secondary | ICD-10-CM | POA: Diagnosis not present

## 2021-05-22 DIAGNOSIS — I69398 Other sequelae of cerebral infarction: Secondary | ICD-10-CM | POA: Diagnosis not present

## 2021-05-22 DIAGNOSIS — J449 Chronic obstructive pulmonary disease, unspecified: Secondary | ICD-10-CM | POA: Diagnosis not present

## 2021-05-22 DIAGNOSIS — M6281 Muscle weakness (generalized): Secondary | ICD-10-CM | POA: Diagnosis not present

## 2021-05-22 DIAGNOSIS — I10 Essential (primary) hypertension: Secondary | ICD-10-CM | POA: Diagnosis not present

## 2021-05-22 DIAGNOSIS — R41841 Cognitive communication deficit: Secondary | ICD-10-CM | POA: Diagnosis not present

## 2021-05-22 DIAGNOSIS — R2681 Unsteadiness on feet: Secondary | ICD-10-CM | POA: Diagnosis not present

## 2021-05-25 DIAGNOSIS — M6281 Muscle weakness (generalized): Secondary | ICD-10-CM | POA: Diagnosis not present

## 2021-05-25 DIAGNOSIS — I69398 Other sequelae of cerebral infarction: Secondary | ICD-10-CM | POA: Diagnosis not present

## 2021-05-25 DIAGNOSIS — J449 Chronic obstructive pulmonary disease, unspecified: Secondary | ICD-10-CM | POA: Diagnosis not present

## 2021-05-25 DIAGNOSIS — I10 Essential (primary) hypertension: Secondary | ICD-10-CM | POA: Diagnosis not present

## 2021-05-25 DIAGNOSIS — R2681 Unsteadiness on feet: Secondary | ICD-10-CM | POA: Diagnosis not present

## 2021-05-25 DIAGNOSIS — R41841 Cognitive communication deficit: Secondary | ICD-10-CM | POA: Diagnosis not present

## 2021-05-26 DIAGNOSIS — I10 Essential (primary) hypertension: Secondary | ICD-10-CM | POA: Diagnosis not present

## 2021-05-26 DIAGNOSIS — R2681 Unsteadiness on feet: Secondary | ICD-10-CM | POA: Diagnosis not present

## 2021-05-26 DIAGNOSIS — I69398 Other sequelae of cerebral infarction: Secondary | ICD-10-CM | POA: Diagnosis not present

## 2021-05-26 DIAGNOSIS — J449 Chronic obstructive pulmonary disease, unspecified: Secondary | ICD-10-CM | POA: Diagnosis not present

## 2021-05-26 DIAGNOSIS — M6281 Muscle weakness (generalized): Secondary | ICD-10-CM | POA: Diagnosis not present

## 2021-05-26 DIAGNOSIS — R41841 Cognitive communication deficit: Secondary | ICD-10-CM | POA: Diagnosis not present

## 2021-05-27 DIAGNOSIS — R2681 Unsteadiness on feet: Secondary | ICD-10-CM | POA: Diagnosis not present

## 2021-05-27 DIAGNOSIS — I69398 Other sequelae of cerebral infarction: Secondary | ICD-10-CM | POA: Diagnosis not present

## 2021-05-27 DIAGNOSIS — M6281 Muscle weakness (generalized): Secondary | ICD-10-CM | POA: Diagnosis not present

## 2021-05-27 DIAGNOSIS — I10 Essential (primary) hypertension: Secondary | ICD-10-CM | POA: Diagnosis not present

## 2021-05-27 DIAGNOSIS — R41841 Cognitive communication deficit: Secondary | ICD-10-CM | POA: Diagnosis not present

## 2021-05-27 DIAGNOSIS — J449 Chronic obstructive pulmonary disease, unspecified: Secondary | ICD-10-CM | POA: Diagnosis not present

## 2021-05-28 DIAGNOSIS — R2681 Unsteadiness on feet: Secondary | ICD-10-CM | POA: Diagnosis not present

## 2021-05-28 DIAGNOSIS — I10 Essential (primary) hypertension: Secondary | ICD-10-CM | POA: Diagnosis not present

## 2021-05-28 DIAGNOSIS — R41841 Cognitive communication deficit: Secondary | ICD-10-CM | POA: Diagnosis not present

## 2021-05-28 DIAGNOSIS — J449 Chronic obstructive pulmonary disease, unspecified: Secondary | ICD-10-CM | POA: Diagnosis not present

## 2021-05-28 DIAGNOSIS — I69398 Other sequelae of cerebral infarction: Secondary | ICD-10-CM | POA: Diagnosis not present

## 2021-05-28 DIAGNOSIS — M6281 Muscle weakness (generalized): Secondary | ICD-10-CM | POA: Diagnosis not present

## 2021-05-29 DIAGNOSIS — M6281 Muscle weakness (generalized): Secondary | ICD-10-CM | POA: Diagnosis not present

## 2021-05-29 DIAGNOSIS — R41841 Cognitive communication deficit: Secondary | ICD-10-CM | POA: Diagnosis not present

## 2021-05-29 DIAGNOSIS — R2681 Unsteadiness on feet: Secondary | ICD-10-CM | POA: Diagnosis not present

## 2021-05-29 DIAGNOSIS — I10 Essential (primary) hypertension: Secondary | ICD-10-CM | POA: Diagnosis not present

## 2021-05-29 DIAGNOSIS — I69398 Other sequelae of cerebral infarction: Secondary | ICD-10-CM | POA: Diagnosis not present

## 2021-05-29 DIAGNOSIS — J449 Chronic obstructive pulmonary disease, unspecified: Secondary | ICD-10-CM | POA: Diagnosis not present

## 2021-06-01 DIAGNOSIS — I10 Essential (primary) hypertension: Secondary | ICD-10-CM | POA: Diagnosis not present

## 2021-06-01 DIAGNOSIS — R41841 Cognitive communication deficit: Secondary | ICD-10-CM | POA: Diagnosis not present

## 2021-06-01 DIAGNOSIS — I69398 Other sequelae of cerebral infarction: Secondary | ICD-10-CM | POA: Diagnosis not present

## 2021-06-01 DIAGNOSIS — J449 Chronic obstructive pulmonary disease, unspecified: Secondary | ICD-10-CM | POA: Diagnosis not present

## 2021-06-01 DIAGNOSIS — R2681 Unsteadiness on feet: Secondary | ICD-10-CM | POA: Diagnosis not present

## 2021-06-01 DIAGNOSIS — M6281 Muscle weakness (generalized): Secondary | ICD-10-CM | POA: Diagnosis not present

## 2021-06-02 DIAGNOSIS — Z85828 Personal history of other malignant neoplasm of skin: Secondary | ICD-10-CM | POA: Diagnosis not present

## 2021-06-02 DIAGNOSIS — I69398 Other sequelae of cerebral infarction: Secondary | ICD-10-CM | POA: Diagnosis not present

## 2021-06-02 DIAGNOSIS — L570 Actinic keratosis: Secondary | ICD-10-CM | POA: Diagnosis not present

## 2021-06-02 DIAGNOSIS — M6281 Muscle weakness (generalized): Secondary | ICD-10-CM | POA: Diagnosis not present

## 2021-06-02 DIAGNOSIS — I10 Essential (primary) hypertension: Secondary | ICD-10-CM | POA: Diagnosis not present

## 2021-06-02 DIAGNOSIS — L821 Other seborrheic keratosis: Secondary | ICD-10-CM | POA: Diagnosis not present

## 2021-06-02 DIAGNOSIS — L814 Other melanin hyperpigmentation: Secondary | ICD-10-CM | POA: Diagnosis not present

## 2021-06-02 DIAGNOSIS — R41841 Cognitive communication deficit: Secondary | ICD-10-CM | POA: Diagnosis not present

## 2021-06-02 DIAGNOSIS — L218 Other seborrheic dermatitis: Secondary | ICD-10-CM | POA: Diagnosis not present

## 2021-06-02 DIAGNOSIS — R2681 Unsteadiness on feet: Secondary | ICD-10-CM | POA: Diagnosis not present

## 2021-06-02 DIAGNOSIS — J449 Chronic obstructive pulmonary disease, unspecified: Secondary | ICD-10-CM | POA: Diagnosis not present

## 2021-06-03 DIAGNOSIS — J449 Chronic obstructive pulmonary disease, unspecified: Secondary | ICD-10-CM | POA: Diagnosis not present

## 2021-06-03 DIAGNOSIS — I69398 Other sequelae of cerebral infarction: Secondary | ICD-10-CM | POA: Diagnosis not present

## 2021-06-03 DIAGNOSIS — R2681 Unsteadiness on feet: Secondary | ICD-10-CM | POA: Diagnosis not present

## 2021-06-03 DIAGNOSIS — R41841 Cognitive communication deficit: Secondary | ICD-10-CM | POA: Diagnosis not present

## 2021-06-03 DIAGNOSIS — I10 Essential (primary) hypertension: Secondary | ICD-10-CM | POA: Diagnosis not present

## 2021-06-03 DIAGNOSIS — M6281 Muscle weakness (generalized): Secondary | ICD-10-CM | POA: Diagnosis not present

## 2021-06-05 DIAGNOSIS — J449 Chronic obstructive pulmonary disease, unspecified: Secondary | ICD-10-CM | POA: Diagnosis not present

## 2021-06-05 DIAGNOSIS — I69398 Other sequelae of cerebral infarction: Secondary | ICD-10-CM | POA: Diagnosis not present

## 2021-06-05 DIAGNOSIS — M6281 Muscle weakness (generalized): Secondary | ICD-10-CM | POA: Diagnosis not present

## 2021-06-05 DIAGNOSIS — I10 Essential (primary) hypertension: Secondary | ICD-10-CM | POA: Diagnosis not present

## 2021-06-05 DIAGNOSIS — R41841 Cognitive communication deficit: Secondary | ICD-10-CM | POA: Diagnosis not present

## 2021-06-05 DIAGNOSIS — R2681 Unsteadiness on feet: Secondary | ICD-10-CM | POA: Diagnosis not present

## 2021-06-08 DIAGNOSIS — R41841 Cognitive communication deficit: Secondary | ICD-10-CM | POA: Diagnosis not present

## 2021-06-08 DIAGNOSIS — I69398 Other sequelae of cerebral infarction: Secondary | ICD-10-CM | POA: Diagnosis not present

## 2021-06-08 DIAGNOSIS — J449 Chronic obstructive pulmonary disease, unspecified: Secondary | ICD-10-CM | POA: Diagnosis not present

## 2021-06-08 DIAGNOSIS — I10 Essential (primary) hypertension: Secondary | ICD-10-CM | POA: Diagnosis not present

## 2021-06-08 DIAGNOSIS — M6281 Muscle weakness (generalized): Secondary | ICD-10-CM | POA: Diagnosis not present

## 2021-06-08 DIAGNOSIS — R2681 Unsteadiness on feet: Secondary | ICD-10-CM | POA: Diagnosis not present

## 2021-06-10 DIAGNOSIS — R2681 Unsteadiness on feet: Secondary | ICD-10-CM | POA: Diagnosis not present

## 2021-06-10 DIAGNOSIS — R41841 Cognitive communication deficit: Secondary | ICD-10-CM | POA: Diagnosis not present

## 2021-06-10 DIAGNOSIS — I10 Essential (primary) hypertension: Secondary | ICD-10-CM | POA: Diagnosis not present

## 2021-06-10 DIAGNOSIS — M6281 Muscle weakness (generalized): Secondary | ICD-10-CM | POA: Diagnosis not present

## 2021-06-10 DIAGNOSIS — J449 Chronic obstructive pulmonary disease, unspecified: Secondary | ICD-10-CM | POA: Diagnosis not present

## 2021-06-10 DIAGNOSIS — I69398 Other sequelae of cerebral infarction: Secondary | ICD-10-CM | POA: Diagnosis not present

## 2021-06-12 ENCOUNTER — Encounter: Payer: Self-pay | Admitting: Nurse Practitioner

## 2021-06-12 ENCOUNTER — Non-Acute Institutional Stay: Payer: Medicare Other | Admitting: Nurse Practitioner

## 2021-06-12 DIAGNOSIS — N390 Urinary tract infection, site not specified: Secondary | ICD-10-CM | POA: Diagnosis not present

## 2021-06-12 DIAGNOSIS — J439 Emphysema, unspecified: Secondary | ICD-10-CM | POA: Diagnosis not present

## 2021-06-12 DIAGNOSIS — R442 Other hallucinations: Secondary | ICD-10-CM

## 2021-06-12 DIAGNOSIS — K5901 Slow transit constipation: Secondary | ICD-10-CM | POA: Diagnosis not present

## 2021-06-12 DIAGNOSIS — R509 Fever, unspecified: Secondary | ICD-10-CM | POA: Diagnosis not present

## 2021-06-12 DIAGNOSIS — H353130 Nonexudative age-related macular degeneration, bilateral, stage unspecified: Secondary | ICD-10-CM

## 2021-06-12 DIAGNOSIS — D649 Anemia, unspecified: Secondary | ICD-10-CM | POA: Diagnosis not present

## 2021-06-12 DIAGNOSIS — I10 Essential (primary) hypertension: Secondary | ICD-10-CM

## 2021-06-12 DIAGNOSIS — R2681 Unsteadiness on feet: Secondary | ICD-10-CM | POA: Diagnosis not present

## 2021-06-12 DIAGNOSIS — J069 Acute upper respiratory infection, unspecified: Secondary | ICD-10-CM | POA: Diagnosis not present

## 2021-06-12 DIAGNOSIS — I63411 Cerebral infarction due to embolism of right middle cerebral artery: Secondary | ICD-10-CM | POA: Diagnosis not present

## 2021-06-12 DIAGNOSIS — M159 Polyosteoarthritis, unspecified: Secondary | ICD-10-CM | POA: Diagnosis not present

## 2021-06-12 DIAGNOSIS — J449 Chronic obstructive pulmonary disease, unspecified: Secondary | ICD-10-CM | POA: Diagnosis not present

## 2021-06-12 DIAGNOSIS — N952 Postmenopausal atrophic vaginitis: Secondary | ICD-10-CM | POA: Diagnosis not present

## 2021-06-12 DIAGNOSIS — I69398 Other sequelae of cerebral infarction: Secondary | ICD-10-CM | POA: Diagnosis not present

## 2021-06-12 DIAGNOSIS — R7303 Prediabetes: Secondary | ICD-10-CM

## 2021-06-12 DIAGNOSIS — R41841 Cognitive communication deficit: Secondary | ICD-10-CM | POA: Diagnosis not present

## 2021-06-12 DIAGNOSIS — K219 Gastro-esophageal reflux disease without esophagitis: Secondary | ICD-10-CM

## 2021-06-12 DIAGNOSIS — M6281 Muscle weakness (generalized): Secondary | ICD-10-CM | POA: Diagnosis not present

## 2021-06-12 DIAGNOSIS — J984 Other disorders of lung: Secondary | ICD-10-CM | POA: Diagnosis not present

## 2021-06-12 DIAGNOSIS — M797 Fibromyalgia: Secondary | ICD-10-CM

## 2021-06-12 DIAGNOSIS — E876 Hypokalemia: Secondary | ICD-10-CM

## 2021-06-12 LAB — BASIC METABOLIC PANEL
BUN: 16 (ref 4–21)
CO2: 25 — AB (ref 13–22)
Chloride: 104 (ref 99–108)
Creatinine: 1.2 — AB (ref 0.5–1.1)
Glucose: 107
Potassium: 4.4 mEq/L (ref 3.5–5.1)
Sodium: 137 (ref 137–147)

## 2021-06-12 LAB — COMPREHENSIVE METABOLIC PANEL
Albumin: 4 (ref 3.5–5.0)
Calcium: 8.8 (ref 8.7–10.7)
Globulin: 2
eGFR: 45

## 2021-06-12 LAB — HEPATIC FUNCTION PANEL
ALT: 10 U/L (ref 7–35)
AST: 16 (ref 13–35)
Alkaline Phosphatase: 82 (ref 25–125)
Bilirubin, Total: 0.5

## 2021-06-12 LAB — CBC AND DIFFERENTIAL
HCT: 36 (ref 36–46)
Hemoglobin: 11.8 — AB (ref 12.0–16.0)
Neutrophils Absolute: 2466
Platelets: 180 10*3/uL (ref 150–400)
WBC: 3.8

## 2021-06-12 LAB — CBC: RBC: 3.85 — AB (ref 3.87–5.11)

## 2021-06-12 NOTE — Assessment & Plan Note (Addendum)
takes Symbicort, prn Proair, cough, wheezing today, no O2 desaturation, negative COVID, afebrile.  ? CXR ap, CBC/diff, CMP/eGFR, Medrol dose pk, DuoNeb q8hr x 4 days, Doxy bid x7 days, Mucinex bid x 5 days.  ?06/12/21 wbc 3.8, Hgb 11.8, plt 180, neutrophils 64.9, Na 137, K 4.4, Bun 16, creat 1.17 ?06/12/21 CXR inflammatory, emphysema, dc D[place] ? ?

## 2021-06-12 NOTE — Assessment & Plan Note (Signed)
Macular degeneration, f/u [facility]: OU for Scotoma MD involving tcentral area. OS exudative age related MD 

## 2021-06-12 NOTE — Assessment & Plan Note (Signed)
Estrace vaginal cream for atrophic vaginitis.  

## 2021-06-12 NOTE — Assessment & Plan Note (Signed)
Hx of CVA, CT x2 03/2021 showed no acute findings except age indeterminate right corona radiator infarct, small vessel changes. Neurology recommended no further work ups. 

## 2021-06-12 NOTE — Progress Notes (Signed)
This encounter was created in error - please disregard.

## 2021-06-12 NOTE — Assessment & Plan Note (Signed)
On and off in nature, off meds. TSH 0.661 03/19/21 ?

## 2021-06-12 NOTE — Assessment & Plan Note (Signed)
Hx of Hgb 6.8 in hospital s/p 2 units of PRBC,  Vit B12, Iron, Hgb 10.7 03/26/21 ?

## 2021-06-12 NOTE — Progress Notes (Addendum)
?Location:  [facility] ?Nursing Home Room Number: 678/L ?Place of Service:  ALF (13) ?Provider:  Bryn Perkin X, NP ? ? ?Marshae Azam X, NP ? ?Patient Care Team: ?Zayden Hahne X, NP as PCP - General (Internal Medicine) ?Yanissa Michalsky X, NP as Nurse Practitioner (Internal Medicine) ? ?Extended Emergency Contact Information ?Primary Emergency Contact: Sidle,Jeff ?Home Phone: [phone] ?Relation: Son ?Secondary Emergency Contact: Culbreth Sr.,Christopher ?Home Phone: [phone] ?Relation: Relative ? ?Code Status:  DNR ?Goals of care: Advanced Directive information ? ?  06/12/2021  ? 11:57 AM  ?Advanced Directives  ?Does Patient Have a Medical Advance Directive? Yes  ?Type of Advance Directive Out of facility DNR (pink MOST or yellow form)  ?Does patient want to make changes to medical advance directive? No - Patient declined  ?Copy of [place] in Chart? Yes - validated most recent copy scanned in chart (See row information)  ? ? ? ?Chief Complaint  ?Patient presents with  ?? Medical Management of Chronic Issues  ?  Patient is here for a follow up for chronic conditions ?  ? ? ?HPI:  ?Pt is a 88 y.o. female seen today for managing chronic medical conditions.  ?  ?  COPD, takes Symbicort, prn Proair, cough, wheezing today, no O2 desaturation, negative COVID, afebrile.  ?Hx of CVA, CT x2 03/2021 showed no acute findings except age indeterminate right corona radiator infarct, small vessel changes. Neurology recommended no further work ups. ?             Macular degeneration, f/u [facility]: OU for Scotoma MD involving tcentral area. OS exudative age related MD ?            Chronic lower back, leg pain, on Flexeril 2.'5mg'$  qhs, Lyrica '75mg'$  qd, but c/o blurred vision since Lyrica was increased to '75mg'$  qhs.  ?            HTN, blood pressure is controlled on Metoprolol. Bun/creat 13/0.8 03/26/21 ?            Urinary symptoms, on  Nitrofurantoin '50mg'$  qd for UTI suppression, Dr. Karsten Ro.  ?            Estrace  vaginal cream for atrophic vaginitis.  ?            Anemia, Hx of Hgb 6.8 in hospital s/p 2 units of PRBC,  Vit B12, Iron, Hgb 10.7 03/26/21 ?            GERD/chronic gastritis/atrophic gastritis, GI bleed 02/2020, s/p EGD, per biopsy, takes Protonix. F/u GI prn ?Fibromyalgia takes Lyrica ?            Tactile hallucinations, off meds. TSH 0.661 03/19/21 ?            Constipation, takes MiraLax 2x/wk. Colace daily.  ?            Prediabetes, Hgb a1c 6.0 03/19/21 ?            Hypokalemia, K 4.0 03/26/21 ? ?Past Medical History:  ?Diagnosis Date  ?? Acute blood loss anemia 08/23/2013  ? 04/13/16 TSH 1.20, Na 141, K 4.2, Bun 15, creat 0.79, wbc 5.5, Hgb 11.5, plt 283  ?? Anxiety   ?? Anxiety state 07/02/2007  ? Qualifier: Diagnosis of  By: Lenna Gilford MD, Deborra Medina   ?? Asthma   ?? 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 Cigarette smoker   ?? Colon polyps 2009  ? TWO TUBULAR ADENOMAS AND HYPERPLASTIC POLYPS  ??  Constipation 09/14/2013  ?? COPD (chronic obstructive pulmonary disease) ([place])   ?? Diverticulosis of colon   ?? DJD (degenerative joint disease)   ?? Dog bite of limb 07/14/2010  ? Dog bite 07/10/10 - dogs shots utd Last td was 08/2009  Localized tx only.    ?? Fibromyalgia   ?? GERD 12/19/2006  ? Qualifier: Diagnosis of  By: Julien Girt CMA, Leigh  04/13/16 TSH 1.20, Na 141, K 4.2, Bun 15, creat 0.79, wbc 5.5, Hgb 11.5, plt 283   ?? GERD (gastroesophageal reflux disease)   ?? Hammer toe of second toe of left foot 04/08/2016  ? Left 2nd  ?? Hearing loss   ?? Hemorrhoids   ?? Hypercholesterolemia   ?? Hypertension   ?? Osteoarthritis 12/19/2006  ? Qualifier: Diagnosis of  By: Julien Girt CMA, Leigh    ?? Osteoporosis   ? ?Past Surgical History:  ?Procedure Laterality Date  ?? BIOPSY  02/17/2020  ? Procedure: BIOPSY;  Surgeon: Jackquline Denmark, MD;  Location: [facility];  Service: Endoscopy;;  ?? CATARACT EXTRACTION, BILATERAL  2009  ?? COLONOSCOPY  2009, 2006 , 2017  ?? ESOPHAGOGASTRODUODENOSCOPY (EGD) WITH PROPOFOL N/A 02/17/2020  ? Procedure:  ESOPHAGOGASTRODUODENOSCOPY (EGD) WITH PROPOFOL;  Surgeon: Jackquline Denmark, MD;  Location: [facility];  Service: Endoscopy;  Laterality: N/A;  ?? IR ANGIOGRAM SELECTIVE EACH ADDITIONAL VESSEL  02/17/2020  ?? IR ANGIOGRAM SELECTIVE EACH ADDITIONAL VESSEL  02/17/2020  ?? IR ANGIOGRAM SELECTIVE EACH ADDITIONAL VESSEL  02/17/2020  ?? IR ANGIOGRAM SELECTIVE EACH ADDITIONAL VESSEL  02/17/2020  ?? IR ANGIOGRAM SELECTIVE EACH ADDITIONAL VESSEL  02/17/2020  ?? IR ANGIOGRAM VISCERAL SELECTIVE  02/17/2020  ?? IR ANGIOGRAM VISCERAL SELECTIVE  02/17/2020  ?? IR US GUIDE VASC ACCESS RIGHT  02/17/2020  ?? stapes surgery    ? Dr Thornell Mule  ?? TONSILLECTOMY AND ADENOIDECTOMY    ? as a child  ? ? ?Allergies  ?Allergen Reactions  ?? Penicillins Anaphylaxis  ?? Bisphosphonates Other (See Comments)  ?  [name]  ?? Influenza Vaccines   ?  Unknown  ?? Simvastatin   ?  Unknown   ?? Trazodone And Nefazodone   ?  Felt her throat was closing up/kept her awake  ?? Sulfonamide Derivatives Rash and Other (See Comments)  ?  blisters  ? ? ?Outpatient Encounter Medications as of 06/12/2021  ?Medication Sig  ?? albuterol (VENTOLIN HFA) 108 (90 Base) MCG/ACT inhaler Inhale into the lungs every 6 (six) hours as needed for wheezing or shortness of breath.  ?? budesonide-formoterol (SYMBICORT) 80-4.5 MCG/ACT inhaler Inhale 1 puff into the lungs daily.  ?? calcium carbonate (TUMS EX) 750 MG chewable tablet Chew 750 mg by mouth daily.   ?? cholecalciferol (VITAMIN D) 1000 UNITS tablet Take 1,000 Units by mouth daily.  ?? cyclobenzaprine (FLEXERIL) 5 MG tablet Take 2.5 mg by mouth at bedtime.  ?? docusate sodium (COLACE) 100 MG capsule Take 100 mg by mouth 2 (two) times daily.  ?? ferrous sulfate 325 (65 FE) MG tablet Take 325 mg by mouth. Once A Day on Mon, Wed, Fri  ?? hydrocortisone cream 1 % Apply 1 application topically 2 (two) times daily as needed (wrists).  ?? hydrocortisone valerate cream (WESTCORT) 0.2 % Apply 1 application topically 2 (two) times daily as  needed (for itching). Cleanse behind ears with gentle cleanser and dry completely.  ?? ketoconazole (NIZORAL) 2 % cream Apply 1 application topically 2 (two) times daily as needed for irritation.  ?? loratadine (CLARITIN) 10 MG tablet Take  10 mg by mouth daily as needed for allergies.  ?? metoprolol tartrate (LOPRESSOR) 25 MG tablet Take 12.5 mg by mouth 2 (two) times daily.  ?? nitrofurantoin (MACRODANTIN) 50 MG capsule Take 50 mg by mouth at bedtime.  ?? Olopatadine HCl 0.2 % SOLN Place 1 drop into both eyes daily.  ?? polyethylene glycol (MIRALAX / GLYCOLAX) 17 g packet Take 17 g by mouth 2 (two) times a week. Sunday and Wednesday  ?? pregabalin (LYRICA) 75 MG capsule Take 1 capsule (75 mg total) by mouth at bedtime.  ?? Propylene Glycol 0.6 % SOLN Place 1 drop into both eyes 4 (four) times daily.  ?? vitamin B-12 (CYANOCOBALAMIN) 1000 MCG tablet Take 1,000 mcg by mouth daily.  ? ?No facility-administered encounter medications on file as of 06/12/2021.  ? ? ?Review of Systems  ?Constitutional:  Positive for fatigue. Negative for appetite change and fever.  ?HENT:  Positive for hearing loss. Negative for congestion and voice change.   ?Eyes:  Negative for visual disturbance.  ?     C/o blurred vision, ? Color, ? Right eye peripheral vision loss.   ?Respiratory:  Positive for cough and wheezing. Negative for chest tightness.   ?Cardiovascular:  Negative for leg swelling.  ?Gastrointestinal:  Negative for abdominal pain and constipation.  ?Genitourinary:  Negative for dysuria and urgency.  ?     Chronic, on and off  ?Musculoskeletal:  Positive for arthralgias, back pain, gait problem and myalgias.  ?Skin:  Negative for color change.  ?Neurological:  Negative for speech difficulty, weakness and light-headedness.  ?Psychiatric/Behavioral:  Negative for behavioral problems, hallucinations and sleep disturbance.   ?     Chronic going to bed late, sleeping in late. Lethargic upon my examination.   ? ?Immunization History   ?Administered Date(s) Administered  ?? Moderna SARS-COV2 Booster Vaccination 12/25/2019  ?? Moderna Sars-Covid-2 Vaccination 02/17/2019, 03/17/2019  ?? Pneumococcal Conjugate-13 09/01/2015  ?? Pneumoc

## 2021-06-12 NOTE — Assessment & Plan Note (Signed)
blood pressure is controlled on Metoprolol. Bun/creat 13/0.8 03/26/21 ?

## 2021-06-12 NOTE — Assessment & Plan Note (Signed)
Hgb a1c 6.0 03/19/21, continue diet control.  ?

## 2021-06-12 NOTE — Assessment & Plan Note (Signed)
Chronic lower back, leg pain, on Flexeril 2.'5mg'$  qhs, Lyrica '75mg'$  qd, but c/o blurred vision since Lyrica was increased to '75mg'$  qhs.  ?

## 2021-06-12 NOTE — Assessment & Plan Note (Signed)
Stable, takes Lyrica 

## 2021-06-12 NOTE — Assessment & Plan Note (Signed)
Resolved, K 4.0 03/26/21 ?

## 2021-06-12 NOTE — Assessment & Plan Note (Signed)
Stable,  takes MiraLax 2x/wk. Colace daily.  

## 2021-06-12 NOTE — Assessment & Plan Note (Signed)
on  Nitrofurantoin 50mg qd for UTI suppression, Dr. Ottelin.  

## 2021-06-12 NOTE — Assessment & Plan Note (Signed)
GERD/chronic gastritis/atrophic gastritis, GI bleed 02/2020, s/p EGD, per biopsy, takes Protonix. F/u GI prn 

## 2021-06-14 DIAGNOSIS — R2681 Unsteadiness on feet: Secondary | ICD-10-CM | POA: Diagnosis not present

## 2021-06-14 DIAGNOSIS — J449 Chronic obstructive pulmonary disease, unspecified: Secondary | ICD-10-CM | POA: Diagnosis not present

## 2021-06-14 DIAGNOSIS — I69398 Other sequelae of cerebral infarction: Secondary | ICD-10-CM | POA: Diagnosis not present

## 2021-06-14 DIAGNOSIS — I10 Essential (primary) hypertension: Secondary | ICD-10-CM | POA: Diagnosis not present

## 2021-06-14 DIAGNOSIS — M6281 Muscle weakness (generalized): Secondary | ICD-10-CM | POA: Diagnosis not present

## 2021-06-14 DIAGNOSIS — R41841 Cognitive communication deficit: Secondary | ICD-10-CM | POA: Diagnosis not present

## 2021-06-15 ENCOUNTER — Other Ambulatory Visit: Payer: Self-pay | Admitting: Adult Health

## 2021-06-15 DIAGNOSIS — M159 Polyosteoarthritis, unspecified: Secondary | ICD-10-CM

## 2021-06-15 DIAGNOSIS — I69398 Other sequelae of cerebral infarction: Secondary | ICD-10-CM | POA: Diagnosis not present

## 2021-06-15 DIAGNOSIS — I69998 Other sequelae following unspecified cerebrovascular disease: Secondary | ICD-10-CM | POA: Diagnosis not present

## 2021-06-15 DIAGNOSIS — G629 Polyneuropathy, unspecified: Secondary | ICD-10-CM

## 2021-06-15 DIAGNOSIS — M6281 Muscle weakness (generalized): Secondary | ICD-10-CM | POA: Diagnosis not present

## 2021-06-15 DIAGNOSIS — R29898 Other symptoms and signs involving the musculoskeletal system: Secondary | ICD-10-CM | POA: Diagnosis not present

## 2021-06-15 DIAGNOSIS — R278 Other lack of coordination: Secondary | ICD-10-CM | POA: Diagnosis not present

## 2021-06-15 DIAGNOSIS — J449 Chronic obstructive pulmonary disease, unspecified: Secondary | ICD-10-CM | POA: Diagnosis not present

## 2021-06-15 DIAGNOSIS — R2681 Unsteadiness on feet: Secondary | ICD-10-CM | POA: Diagnosis not present

## 2021-06-15 DIAGNOSIS — I10 Essential (primary) hypertension: Secondary | ICD-10-CM | POA: Diagnosis not present

## 2021-06-15 MED ORDER — PREGABALIN 75 MG PO CAPS: 75.0000 mg | ORAL_CAPSULE | Freq: Every day | ORAL | 0 refills | Status: DC

## 2021-06-17 ENCOUNTER — Encounter: Payer: Self-pay | Admitting: Orthopedic Surgery

## 2021-06-17 ENCOUNTER — Other Ambulatory Visit: Payer: Self-pay | Admitting: Orthopedic Surgery

## 2021-06-17 ENCOUNTER — Non-Acute Institutional Stay: Payer: Medicare Other | Admitting: Orthopedic Surgery

## 2021-06-17 DIAGNOSIS — I10 Essential (primary) hypertension: Secondary | ICD-10-CM

## 2021-06-17 DIAGNOSIS — J449 Chronic obstructive pulmonary disease, unspecified: Secondary | ICD-10-CM

## 2021-06-17 DIAGNOSIS — I63411 Cerebral infarction due to embolism of right middle cerebral artery: Secondary | ICD-10-CM

## 2021-06-17 DIAGNOSIS — M6281 Muscle weakness (generalized): Secondary | ICD-10-CM | POA: Diagnosis not present

## 2021-06-17 DIAGNOSIS — M159 Polyosteoarthritis, unspecified: Secondary | ICD-10-CM

## 2021-06-17 DIAGNOSIS — I69398 Other sequelae of cerebral infarction: Secondary | ICD-10-CM | POA: Diagnosis not present

## 2021-06-17 DIAGNOSIS — J302 Other seasonal allergic rhinitis: Secondary | ICD-10-CM

## 2021-06-17 DIAGNOSIS — R7303 Prediabetes: Secondary | ICD-10-CM

## 2021-06-17 DIAGNOSIS — R29898 Other symptoms and signs involving the musculoskeletal system: Secondary | ICD-10-CM | POA: Diagnosis not present

## 2021-06-17 DIAGNOSIS — R2681 Unsteadiness on feet: Secondary | ICD-10-CM | POA: Diagnosis not present

## 2021-06-17 MED ORDER — GUAIFENESIN ER 600 MG PO TB12: 600.0000 mg | ORAL_TABLET | Freq: Two times a day (BID) | ORAL | 0 refills | Status: DC

## 2021-06-17 MED ORDER — IPRATROPIUM-ALBUTEROL 0.5-2.5 (3) MG/3ML IN SOLN: 3.0000 mL | Freq: Every morning | RESPIRATORY_TRACT | 0 refills | Status: DC

## 2021-06-17 MED ORDER — CETIRIZINE HCL 10 MG PO TABS: 10.0000 mg | ORAL_TABLET | Freq: Every day | ORAL | 11 refills | Status: DC

## 2021-06-17 MED ORDER — IPRATROPIUM-ALBUTEROL 0.5-2.5 (3) MG/3ML IN SOLN: 3.0000 mL | Freq: Every day | RESPIRATORY_TRACT | 0 refills | Status: DC | PRN

## 2021-06-17 NOTE — Progress Notes (Signed)
?Location:  [facility] ?Nursing Home Room Number: [id number] ?Place of Service:  ALF (13) ?Provider: Yvonna Alanis, NP ? ? ?Patient Care Team: ?Mast, Man X, NP as PCP - General (Internal Medicine) ?Mast, Man X, NP as Nurse Practitioner (Internal Medicine) ? ?Extended Emergency Contact Information ?Primary Emergency Contact: Bihl,Jeff ?Home Phone: [phone] ?Relation: Son ?Secondary Emergency Contact: Culbreth Sr.,Christopher ?Home Phone: [phone] ?Relation: Relative ? ?Code Status:  DNR ?Goals of care: Advanced Directive information ? ?  06/17/2021  ? 12:27 PM  ?Advanced Directives  ?Does Patient Have a Medical Advance Directive? Yes  ?Type of Advance Directive Out of facility DNR (pink MOST or yellow form);Living will  ?Does patient want to make changes to medical advance directive? No - Patient declined  ? ? ? ?Chief Complaint  ?Patient presents with  ? Acute Visit  ?  Cough and nasal congestion  ? ? ?HPI:  ?Pt is a 88 y.o. female seen today for an acute visit for cough and nasal congestion.  ? ?04/28 she was seen for increased wheezing. Covid negative. Cbc/diff, cmp unremarkable. CXR noted perihilar changes and emphysema, no acute findings. She was started on Medrol dose pack- 2 days left, duonebs x 4 days, mucinex x 5 days and doxycycline. Doxycycline discontinued after CXR results negative for pneumonia. Today, her wheezing and cough have returned. She is asking to restart neb treatment and mucinex. She is also reporting clear nasal drainage, believes it is allergies. Vitals stable. O2sat 97% on room air, RR 18. She is currently taking Symbicort daily and albuterol prn.  ? ?H/o CVA- 03/19/2021 CT head negative for large vessel occlusion or stenosis, no further workup per neurology ?HTN- BUN/creat 16/1.2 06/12/2021, remains on metoprolol ?Prediabetes- A1c 6.0 03/19/2021, diet controlled ?OA multiple joints- chronic low back and bilateral leg pain most bothersome, remains on Lyrica and flexeril   ? ? ?Past Medical History:  ?Diagnosis Date  ? Acute blood loss anemia 08/23/2013  ? 04/13/16 TSH 1.20, Na 141, K 4.2, Bun 15, creat 0.79, wbc 5.5, Hgb 11.5, plt 283  ? Anxiety   ? Anxiety state 07/02/2007  ? Qualifier: Diagnosis of  By: Lenna Gilford MD, Deborra Medina   ? Asthma   ? 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 Cigarette smoker   ? Colon polyps 2009  ? TWO TUBULAR ADENOMAS AND HYPERPLASTIC POLYPS  ? Constipation 09/14/2013  ? COPD (chronic obstructive pulmonary disease) ([place])   ? Diverticulosis of colon   ? DJD (degenerative joint disease)   ? Dog bite of limb 07/14/2010  ? Dog bite 07/10/10 - dogs shots utd Last td was 08/2009  Localized tx only.    ? Fibromyalgia   ? GERD 12/19/2006  ? Qualifier: Diagnosis of  By: Julien Girt CMA, Leigh  04/13/16 TSH 1.20, Na 141, K 4.2, Bun 15, creat 0.79, wbc 5.5, Hgb 11.5, plt 283   ? GERD (gastroesophageal reflux disease)   ? Hammer toe of second toe of left foot 04/08/2016  ? Left 2nd  ? Hearing loss   ? Hemorrhoids   ? Hypercholesterolemia   ? Hypertension   ? Osteoarthritis 12/19/2006  ? Qualifier: Diagnosis of  By: Julien Girt CMA, Leigh    ? Osteoporosis   ? ?Past Surgical History:  ?Procedure Laterality Date  ? BIOPSY  02/17/2020  ? Procedure: BIOPSY;  Surgeon: Jackquline Denmark, MD;  Location: [facility];  Service: Endoscopy;;  ? CATARACT EXTRACTION, BILATERAL  2009  ? COLONOSCOPY  2009, 2006 , 2017  ? ESOPHAGOGASTRODUODENOSCOPY (  EGD) WITH PROPOFOL N/A 02/17/2020  ? Procedure: ESOPHAGOGASTRODUODENOSCOPY (EGD) WITH PROPOFOL;  Surgeon: Jackquline Denmark, MD;  Location: [facility];  Service: Endoscopy;  Laterality: N/A;  ? IR ANGIOGRAM SELECTIVE EACH ADDITIONAL VESSEL  02/17/2020  ? IR ANGIOGRAM SELECTIVE EACH ADDITIONAL VESSEL  02/17/2020  ? IR ANGIOGRAM SELECTIVE EACH ADDITIONAL VESSEL  02/17/2020  ? IR ANGIOGRAM SELECTIVE EACH ADDITIONAL VESSEL  02/17/2020  ? IR ANGIOGRAM SELECTIVE EACH ADDITIONAL VESSEL  02/17/2020  ? IR ANGIOGRAM VISCERAL SELECTIVE  02/17/2020  ? IR ANGIOGRAM VISCERAL SELECTIVE   02/17/2020  ? IR US GUIDE VASC ACCESS RIGHT  02/17/2020  ? stapes surgery    ? Dr Thornell Mule  ? TONSILLECTOMY AND ADENOIDECTOMY    ? as a child  ? ? ?Allergies  ?Allergen Reactions  ? Penicillins Anaphylaxis  ? Bisphosphonates Other (See Comments)  ?  [name]  ? Influenza Vaccines   ?  Unknown  ? Simvastatin   ?  Unknown   ? Trazodone And Nefazodone   ?  Felt her throat was closing up/kept her awake  ? Sulfonamide Derivatives Rash and Other (See Comments)  ?  blisters  ? ? ?Outpatient Encounter Medications as of 06/17/2021  ?Medication Sig  ? albuterol (VENTOLIN HFA) 108 (90 Base) MCG/ACT inhaler Inhale into the lungs every 6 (six) hours as needed for wheezing or shortness of breath.  ? budesonide-formoterol (SYMBICORT) 80-4.5 MCG/ACT inhaler Inhale 1 puff into the lungs daily.  ? calcium carbonate (TUMS EX) 750 MG chewable tablet Chew 750 mg by mouth daily.   ? cholecalciferol (VITAMIN D) 1000 UNITS tablet Take 1,000 Units by mouth daily.  ? cyclobenzaprine (FLEXERIL) 5 MG tablet Take 2.5 mg by mouth at bedtime.  ? docusate sodium (COLACE) 100 MG capsule Take 100 mg by mouth 2 (two) times daily.  ? ferrous sulfate 325 (65 FE) MG tablet Take 325 mg by mouth. Once A Day on Mon, Wed, Fri  ? hydrocortisone cream 1 % Apply 1 application topically 2 (two) times daily as needed (wrists).  ? hydrocortisone valerate cream (WESTCORT) 0.2 % Apply 1 application topically 2 (two) times daily as needed (for itching). Cleanse behind ears with gentle cleanser and dry completely.  ? ketoconazole (NIZORAL) 2 % cream Apply 1 application topically 2 (two) times daily as needed for irritation.  ? metoprolol tartrate (LOPRESSOR) 25 MG tablet Take 12.5 mg by mouth 2 (two) times daily.  ? nitrofurantoin (MACRODANTIN) 50 MG capsule Take 50 mg by mouth at bedtime.  ? Olopatadine HCl 0.2 % SOLN Place 1 drop into both eyes daily.  ? polyethylene glycol (MIRALAX / GLYCOLAX) 17 g packet Take 17 g by mouth 2 (two) times a week. Sunday and Wednesday   ? pregabalin (LYRICA) 75 MG capsule Take 1 capsule (75 mg total) by mouth at bedtime.  ? Propylene Glycol 0.6 % SOLN Place 1 drop into both eyes 4 (four) times daily.  ? vitamin B-12 (CYANOCOBALAMIN) 1000 MCG tablet Take 1,000 mcg by mouth daily.  ? [DISCONTINUED] loratadine (CLARITIN) 10 MG tablet Take 10 mg by mouth daily as needed for allergies. (Patient not taking: Reported on 06/17/2021)  ? ?No facility-administered encounter medications on file as of 06/17/2021.  ? ? ?Review of Systems  ?Constitutional:  Negative for activity change, appetite change, chills, fatigue and fever.  ?HENT:  Positive for congestion and rhinorrhea. Negative for sore throat and trouble swallowing.   ?Respiratory:  Positive for cough and wheezing. Negative for shortness of breath.   ?Cardiovascular:  Negative for chest pain and leg swelling.  ?Gastrointestinal:  Negative for nausea and vomiting.  ?Musculoskeletal:  Positive for arthralgias, gait problem and myalgias.  ?Neurological:  Positive for weakness. Negative for dizziness and headaches.  ?Psychiatric/Behavioral:  Negative for dysphoric mood. The patient is not nervous/anxious.   ? ?Immunization History  ?Administered Date(s) Administered  ? Moderna SARS-COV2 Booster Vaccination 12/25/2019  ? Moderna Sars-Covid-2 Vaccination 02/17/2019, 03/17/2019  ? Pneumococcal Conjugate-13 09/01/2015  ? Pneumococcal Polysaccharide-23 07/07/1998  ? Td 08/21/2009  ? ?Pertinent  Health Maintenance Due  ?Topic Date Due  ? DEXA SCAN  Completed  ? INFLUENZA VACCINE  Discontinued  ? ? ?  03/18/2021  ? 10:52 AM 03/19/2021  ?  3:00 AM 03/19/2021  ?  8:00 AM 03/20/2021  ? 12:00 AM 03/20/2021  ? 12:00 PM  ?Fall Risk  ?Patient Fall Risk Level High fall risk High fall risk High fall risk High fall risk High fall risk  ? ?Functional Status Survey: ?  ? ?Vitals:  ? 06/17/21 1220  ?BP: 122/72  ?Pulse: 66  ?Resp: 18  ?Temp: 98.4 ?F (36.9 ?C)  ?SpO2: 97%  ?Weight: 142 lb 3.2 oz (64.5 kg)  ?Height: '5\' 1"'$  (1.549 m)  ? ?Body  mass index is 26.87 kg/m?Marland Kitchen ?Physical Exam ?Vitals reviewed.  ?Constitutional:   ?   General: She is not in acute distress. ?HENT:  ?   Head: Normocephalic.  ?Eyes:  ?   General:     ?   Right eye: No discharg

## 2021-06-18 ENCOUNTER — Non-Acute Institutional Stay: Payer: Medicare Other | Admitting: Internal Medicine

## 2021-06-18 ENCOUNTER — Encounter: Payer: Self-pay | Admitting: Internal Medicine

## 2021-06-18 DIAGNOSIS — J449 Chronic obstructive pulmonary disease, unspecified: Secondary | ICD-10-CM | POA: Diagnosis not present

## 2021-06-18 DIAGNOSIS — R2681 Unsteadiness on feet: Secondary | ICD-10-CM | POA: Diagnosis not present

## 2021-06-18 DIAGNOSIS — G629 Polyneuropathy, unspecified: Secondary | ICD-10-CM | POA: Diagnosis not present

## 2021-06-18 DIAGNOSIS — J441 Chronic obstructive pulmonary disease with (acute) exacerbation: Secondary | ICD-10-CM | POA: Diagnosis not present

## 2021-06-18 DIAGNOSIS — R29898 Other symptoms and signs involving the musculoskeletal system: Secondary | ICD-10-CM | POA: Diagnosis not present

## 2021-06-18 DIAGNOSIS — M159 Polyosteoarthritis, unspecified: Secondary | ICD-10-CM | POA: Diagnosis not present

## 2021-06-18 DIAGNOSIS — I10 Essential (primary) hypertension: Secondary | ICD-10-CM | POA: Diagnosis not present

## 2021-06-18 DIAGNOSIS — M6281 Muscle weakness (generalized): Secondary | ICD-10-CM | POA: Diagnosis not present

## 2021-06-18 DIAGNOSIS — I69398 Other sequelae of cerebral infarction: Secondary | ICD-10-CM | POA: Diagnosis not present

## 2021-06-18 NOTE — Progress Notes (Signed)
?Location:   [facility] ?Nursing Home Room Number: 850 ?Place of Service:  ALF (13) ?Provider:  Veleta Miners MD ? ?Mast, Man X, NP ? ?Patient Care Team: ?Mast, Man X, NP as PCP - General (Internal Medicine) ?Mast, Man X, NP as Nurse Practitioner (Internal Medicine) ? ?Extended Emergency Contact Information ?Primary Emergency Contact: Hartshorn,Jeff ?Home Phone: [phone] ?Relation: Son ?Secondary Emergency Contact: Culbreth Sr.,Christopher ?Home Phone: [phone] ?Relation: Relative ? ?Code Status:  DNR ?Goals of care: Advanced Directive information ? ?  06/18/2021  ?  3:41 PM  ?Advanced Directives  ?Does Patient Have a Medical Advance Directive? Yes  ?Type of Advance Directive Out of facility DNR (pink MOST or yellow form);Living will  ?Does patient want to make changes to medical advance directive? No - Patient declined  ? ? ? ?Chief Complaint  ?Patient presents with  ? Acute Visit  ?  SOB  ? ? ?HPI:  ?Pt is a 89 y.o. female seen today for an acute visit for SOB ? ? ?Patient is long term resident of AL unit in [place]. ?Patient has h/o fibromyalgia on Lyrica  ?hypertension, hyperlipidemia  ?anemia, osteoporosis, ?COPD , ?Insomnia and Tactile hallucinations but patient refuses to take any Meds for it ?Chronic nonsteroidal use ? ?Lives in [place] ?Had COPD exacerbation.  And was started on Medrol pack and nebs.  Her chest x-ray was negative for any infiltrate. ?Today is her last day of Medrol but patient continues to have shortness of breath and wheezing.  She is on 2 L of oxygen.  She is also complaining of cough mostly dry.  Denies any fever or chest pains. ? ? ?Past Medical History:  ?Diagnosis Date  ? Acute blood loss anemia 08/23/2013  ? 04/13/16 TSH 1.20, Na 141, K 4.2, Bun 15, creat 0.79, wbc 5.5, Hgb 11.5, plt 283  ? Anxiety   ? Anxiety state 07/02/2007  ? Qualifier: Diagnosis of  By: Lenna Gilford MD, Deborra Medina   ? Asthma   ? 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 Cigarette smoker   ? Colon  polyps 2009  ? TWO TUBULAR ADENOMAS AND HYPERPLASTIC POLYPS  ? Constipation 09/14/2013  ? COPD (chronic obstructive pulmonary disease) ([place])   ? Diverticulosis of colon   ? DJD (degenerative joint disease)   ? Dog bite of limb 07/14/2010  ? Dog bite 07/10/10 - dogs shots utd Last td was 08/2009  Localized tx only.    ? Fibromyalgia   ? GERD 12/19/2006  ? Qualifier: Diagnosis of  By: Julien Girt CMA, Leigh  04/13/16 TSH 1.20, Na 141, K 4.2, Bun 15, creat 0.79, wbc 5.5, Hgb 11.5, plt 283   ? GERD (gastroesophageal reflux disease)   ? Hammer toe of second toe of left foot 04/08/2016  ? Left 2nd  ? Hearing loss   ? Hemorrhoids   ? Hypercholesterolemia   ? Hypertension   ? Osteoarthritis 12/19/2006  ? Qualifier: Diagnosis of  By: Julien Girt CMA, Leigh    ? Osteoporosis   ? ?Past Surgical History:  ?Procedure Laterality Date  ? BIOPSY  02/17/2020  ? Procedure: BIOPSY;  Surgeon: Jackquline Denmark, MD;  Location: [facility];  Service: Endoscopy;;  ? CATARACT EXTRACTION, BILATERAL  2009  ? COLONOSCOPY  2009, 2006 , 2017  ? ESOPHAGOGASTRODUODENOSCOPY (EGD) WITH PROPOFOL N/A 02/17/2020  ? Procedure: ESOPHAGOGASTRODUODENOSCOPY (EGD) WITH PROPOFOL;  Surgeon: Jackquline Denmark, MD;  Location: [facility];  Service: Endoscopy;  Laterality: N/A;  ? IR ANGIOGRAM SELECTIVE EACH ADDITIONAL VESSEL  02/17/2020  ?  IR ANGIOGRAM SELECTIVE EACH ADDITIONAL VESSEL  02/17/2020  ? IR ANGIOGRAM SELECTIVE EACH ADDITIONAL VESSEL  02/17/2020  ? IR ANGIOGRAM SELECTIVE EACH ADDITIONAL VESSEL  02/17/2020  ? IR ANGIOGRAM SELECTIVE EACH ADDITIONAL VESSEL  02/17/2020  ? IR ANGIOGRAM VISCERAL SELECTIVE  02/17/2020  ? IR ANGIOGRAM VISCERAL SELECTIVE  02/17/2020  ? IR US GUIDE VASC ACCESS RIGHT  02/17/2020  ? stapes surgery    ? Dr Thornell Mule  ? TONSILLECTOMY AND ADENOIDECTOMY    ? as a child  ? ? ?Allergies  ?Allergen Reactions  ? Penicillins Anaphylaxis  ? Bisphosphonates Other (See Comments)  ?  [name]  ? Influenza Vaccines   ?  Unknown  ? Simvastatin   ?  Unknown   ? Trazodone And  Nefazodone   ?  Felt her throat was closing up/kept her awake  ? Sulfonamide Derivatives Rash and Other (See Comments)  ?  blisters  ? ? ?Allergies as of 06/18/2021   ? ?   Reactions  ? Penicillins Anaphylaxis  ? Bisphosphonates Other (See Comments)  ? [name]  ? Influenza Vaccines   ? Unknown  ? Simvastatin   ? Unknown   ? Trazodone And Nefazodone   ? Felt her throat was closing up/kept her awake  ? Sulfonamide Derivatives Rash, Other (See Comments)  ? blisters  ? ?  ? ?  ?Medication List  ?  ? ?  ? Accurate as of Jun 18, 2021  3:44 PM. If you have any questions, ask your nurse or doctor.  ?  ?  ? ?  ? ?albuterol 108 (90 Base) MCG/ACT inhaler ?Commonly known as: VENTOLIN HFA ?Inhale into the lungs every 6 (six) hours as needed for wheezing or shortness of breath. ?  ?budesonide-formoterol 80-4.5 MCG/ACT inhaler ?Commonly known as: SYMBICORT ?Inhale 1 puff into the lungs daily. ?  ?calcium carbonate 750 MG chewable tablet ?Commonly known as: TUMS EX ?Chew 750 mg by mouth daily. ?  ?cetirizine 10 MG tablet ?Commonly known as: ZYRTEC ?Take 1 tablet (10 mg total) by mouth daily. ?Start taking on: July 29, 2021 ?  ?cholecalciferol 1000 units tablet ?Commonly known as: VITAMIN D ?Take 1,000 Units by mouth daily. ?  ?cyclobenzaprine 5 MG tablet ?Commonly known as: FLEXERIL ?Take 2.5 mg by mouth at bedtime. ?  ?docusate sodium 100 MG capsule ?Commonly known as: COLACE ?Take 100 mg by mouth 2 (two) times daily. ?  ?ferrous sulfate 325 (65 FE) MG tablet ?Take 325 mg by mouth. Once A Day on Mon, Wed, Fri ?  ?guaiFENesin 600 MG 12 hr tablet ?Commonly known as: Mucinex ?Take 1 tablet (600 mg total) by mouth 2 (two) times daily. ?Start taking on: Jul 15, 2021 ?  ?hydrocortisone cream 1 % ?Apply 1 application topically 2 (two) times daily as needed (wrists). ?  ?hydrocortisone valerate cream 0.2 % ?Commonly known as: WESTCORT ?Apply 1 application topically 2 (two) times daily as needed (for itching). Cleanse behind ears with  gentle cleanser and dry completely. ?  ?ipratropium-albuterol 0.5-2.5 (3) MG/3ML Soln ?Commonly known as: DUONEB ?Take 3 mLs by nebulization in the morning. ?  ?ipratropium-albuterol 0.5-2.5 (3) MG/3ML Soln ?Commonly known as: DUONEB ?Take 3 mLs by nebulization daily as needed. ?  ?ketoconazole 2 % cream ?Commonly known as: NIZORAL ?Apply 1 application topically 2 (two) times daily as needed for irritation. ?  ?methylPREDNISolone 4 MG tablet ?Commonly known as: MEDROL ?Take 4 mg by mouth once. ?  ?metoprolol tartrate 25 MG tablet ?  Commonly known as: LOPRESSOR ?Take 12.5 mg by mouth 2 (two) times daily. ?  ?nitrofurantoin 50 MG capsule ?Commonly known as: MACRODANTIN ?Take 50 mg by mouth at bedtime. ?  ?Olopatadine HCl 0.2 % Soln ?Place 1 drop into both eyes daily. ?  ?polyethylene glycol 17 g packet ?Commonly known as: MIRALAX / GLYCOLAX ?Take 17 g by mouth 2 (two) times a week. Sunday and Wednesday ?  ?pregabalin 75 MG capsule ?Commonly known as: LYRICA ?Take 1 capsule (75 mg total) by mouth at bedtime. ?  ?Propylene Glycol 0.6 % Soln ?Place 1 drop into both eyes 4 (four) times daily. ?  ?vitamin B-12 1000 MCG tablet ?Commonly known as: CYANOCOBALAMIN ?Take 1,000 mcg by mouth daily. ?  ? ?  ? ? ?Review of Systems  ?Constitutional:  Positive for activity change. Negative for appetite change.  ?HENT: Negative.    ?Respiratory:  Positive for cough and shortness of breath.   ?Cardiovascular:  Negative for leg swelling.  ?Gastrointestinal:  Negative for constipation.  ?Genitourinary: Negative.   ?Musculoskeletal:  Negative for arthralgias, gait problem and myalgias.  ?Skin: Negative.   ?Neurological:  Positive for weakness. Negative for dizziness.  ?Psychiatric/Behavioral:  Negative for confusion, dysphoric mood and sleep disturbance.   ? ?Immunization History  ?Administered Date(s) Administered  ? Moderna SARS-COV2 Booster Vaccination 12/25/2019  ? Moderna Sars-Covid-2 Vaccination 02/17/2019, 03/17/2019  ? Pneumococcal  Conjugate-13 09/01/2015  ? Pneumococcal Polysaccharide-23 07/07/1998  ? Td 08/21/2009  ? ?Pertinent  Health Maintenance Due  ?Topic Date Due  ? DEXA SCAN  Completed  ? INFLUENZA VACCINE  Discontinued  ? ? ?  2/1/

## 2021-06-19 DIAGNOSIS — I10 Essential (primary) hypertension: Secondary | ICD-10-CM | POA: Diagnosis not present

## 2021-06-19 DIAGNOSIS — M6281 Muscle weakness (generalized): Secondary | ICD-10-CM | POA: Diagnosis not present

## 2021-06-19 DIAGNOSIS — R29898 Other symptoms and signs involving the musculoskeletal system: Secondary | ICD-10-CM | POA: Diagnosis not present

## 2021-06-19 DIAGNOSIS — J449 Chronic obstructive pulmonary disease, unspecified: Secondary | ICD-10-CM | POA: Diagnosis not present

## 2021-06-19 DIAGNOSIS — R2681 Unsteadiness on feet: Secondary | ICD-10-CM | POA: Diagnosis not present

## 2021-06-19 DIAGNOSIS — I69398 Other sequelae of cerebral infarction: Secondary | ICD-10-CM | POA: Diagnosis not present

## 2021-06-22 DIAGNOSIS — I69398 Other sequelae of cerebral infarction: Secondary | ICD-10-CM | POA: Diagnosis not present

## 2021-06-22 DIAGNOSIS — R2681 Unsteadiness on feet: Secondary | ICD-10-CM | POA: Diagnosis not present

## 2021-06-22 DIAGNOSIS — I10 Essential (primary) hypertension: Secondary | ICD-10-CM | POA: Diagnosis not present

## 2021-06-22 DIAGNOSIS — J449 Chronic obstructive pulmonary disease, unspecified: Secondary | ICD-10-CM | POA: Diagnosis not present

## 2021-06-22 DIAGNOSIS — M6281 Muscle weakness (generalized): Secondary | ICD-10-CM | POA: Diagnosis not present

## 2021-06-22 DIAGNOSIS — R29898 Other symptoms and signs involving the musculoskeletal system: Secondary | ICD-10-CM | POA: Diagnosis not present

## 2021-06-24 DIAGNOSIS — I69398 Other sequelae of cerebral infarction: Secondary | ICD-10-CM | POA: Diagnosis not present

## 2021-06-24 DIAGNOSIS — R29898 Other symptoms and signs involving the musculoskeletal system: Secondary | ICD-10-CM | POA: Diagnosis not present

## 2021-06-24 DIAGNOSIS — I10 Essential (primary) hypertension: Secondary | ICD-10-CM | POA: Diagnosis not present

## 2021-06-24 DIAGNOSIS — R2681 Unsteadiness on feet: Secondary | ICD-10-CM | POA: Diagnosis not present

## 2021-06-24 DIAGNOSIS — J449 Chronic obstructive pulmonary disease, unspecified: Secondary | ICD-10-CM | POA: Diagnosis not present

## 2021-06-24 DIAGNOSIS — M6281 Muscle weakness (generalized): Secondary | ICD-10-CM | POA: Diagnosis not present

## 2021-06-26 DIAGNOSIS — J449 Chronic obstructive pulmonary disease, unspecified: Secondary | ICD-10-CM | POA: Diagnosis not present

## 2021-06-26 DIAGNOSIS — M6281 Muscle weakness (generalized): Secondary | ICD-10-CM | POA: Diagnosis not present

## 2021-06-26 DIAGNOSIS — I69398 Other sequelae of cerebral infarction: Secondary | ICD-10-CM | POA: Diagnosis not present

## 2021-06-26 DIAGNOSIS — R29898 Other symptoms and signs involving the musculoskeletal system: Secondary | ICD-10-CM | POA: Diagnosis not present

## 2021-06-26 DIAGNOSIS — R2681 Unsteadiness on feet: Secondary | ICD-10-CM | POA: Diagnosis not present

## 2021-06-26 DIAGNOSIS — I10 Essential (primary) hypertension: Secondary | ICD-10-CM | POA: Diagnosis not present

## 2021-06-29 DIAGNOSIS — I10 Essential (primary) hypertension: Secondary | ICD-10-CM | POA: Diagnosis not present

## 2021-06-29 DIAGNOSIS — M6281 Muscle weakness (generalized): Secondary | ICD-10-CM | POA: Diagnosis not present

## 2021-06-29 DIAGNOSIS — R29898 Other symptoms and signs involving the musculoskeletal system: Secondary | ICD-10-CM | POA: Diagnosis not present

## 2021-06-29 DIAGNOSIS — I69398 Other sequelae of cerebral infarction: Secondary | ICD-10-CM | POA: Diagnosis not present

## 2021-06-29 DIAGNOSIS — J449 Chronic obstructive pulmonary disease, unspecified: Secondary | ICD-10-CM | POA: Diagnosis not present

## 2021-06-29 DIAGNOSIS — R2681 Unsteadiness on feet: Secondary | ICD-10-CM | POA: Diagnosis not present

## 2021-06-30 DIAGNOSIS — R29898 Other symptoms and signs involving the musculoskeletal system: Secondary | ICD-10-CM | POA: Diagnosis not present

## 2021-06-30 DIAGNOSIS — J449 Chronic obstructive pulmonary disease, unspecified: Secondary | ICD-10-CM | POA: Diagnosis not present

## 2021-06-30 DIAGNOSIS — R2681 Unsteadiness on feet: Secondary | ICD-10-CM | POA: Diagnosis not present

## 2021-06-30 DIAGNOSIS — I69398 Other sequelae of cerebral infarction: Secondary | ICD-10-CM | POA: Diagnosis not present

## 2021-06-30 DIAGNOSIS — I10 Essential (primary) hypertension: Secondary | ICD-10-CM | POA: Diagnosis not present

## 2021-06-30 DIAGNOSIS — M6281 Muscle weakness (generalized): Secondary | ICD-10-CM | POA: Diagnosis not present

## 2021-07-03 DIAGNOSIS — I69398 Other sequelae of cerebral infarction: Secondary | ICD-10-CM | POA: Diagnosis not present

## 2021-07-03 DIAGNOSIS — I10 Essential (primary) hypertension: Secondary | ICD-10-CM | POA: Diagnosis not present

## 2021-07-03 DIAGNOSIS — M6281 Muscle weakness (generalized): Secondary | ICD-10-CM | POA: Diagnosis not present

## 2021-07-03 DIAGNOSIS — J449 Chronic obstructive pulmonary disease, unspecified: Secondary | ICD-10-CM | POA: Diagnosis not present

## 2021-07-03 DIAGNOSIS — R2681 Unsteadiness on feet: Secondary | ICD-10-CM | POA: Diagnosis not present

## 2021-07-03 DIAGNOSIS — R29898 Other symptoms and signs involving the musculoskeletal system: Secondary | ICD-10-CM | POA: Diagnosis not present

## 2021-07-06 DIAGNOSIS — R29898 Other symptoms and signs involving the musculoskeletal system: Secondary | ICD-10-CM | POA: Diagnosis not present

## 2021-07-06 DIAGNOSIS — I10 Essential (primary) hypertension: Secondary | ICD-10-CM | POA: Diagnosis not present

## 2021-07-06 DIAGNOSIS — M6281 Muscle weakness (generalized): Secondary | ICD-10-CM | POA: Diagnosis not present

## 2021-07-06 DIAGNOSIS — R2681 Unsteadiness on feet: Secondary | ICD-10-CM | POA: Diagnosis not present

## 2021-07-06 DIAGNOSIS — I69398 Other sequelae of cerebral infarction: Secondary | ICD-10-CM | POA: Diagnosis not present

## 2021-07-06 DIAGNOSIS — J449 Chronic obstructive pulmonary disease, unspecified: Secondary | ICD-10-CM | POA: Diagnosis not present

## 2021-07-08 DIAGNOSIS — J449 Chronic obstructive pulmonary disease, unspecified: Secondary | ICD-10-CM | POA: Diagnosis not present

## 2021-07-08 DIAGNOSIS — I10 Essential (primary) hypertension: Secondary | ICD-10-CM | POA: Diagnosis not present

## 2021-07-08 DIAGNOSIS — I69398 Other sequelae of cerebral infarction: Secondary | ICD-10-CM | POA: Diagnosis not present

## 2021-07-08 DIAGNOSIS — R29898 Other symptoms and signs involving the musculoskeletal system: Secondary | ICD-10-CM | POA: Diagnosis not present

## 2021-07-08 DIAGNOSIS — R2681 Unsteadiness on feet: Secondary | ICD-10-CM | POA: Diagnosis not present

## 2021-07-08 DIAGNOSIS — M6281 Muscle weakness (generalized): Secondary | ICD-10-CM | POA: Diagnosis not present

## 2021-07-10 DIAGNOSIS — R2681 Unsteadiness on feet: Secondary | ICD-10-CM | POA: Diagnosis not present

## 2021-07-10 DIAGNOSIS — I10 Essential (primary) hypertension: Secondary | ICD-10-CM | POA: Diagnosis not present

## 2021-07-10 DIAGNOSIS — M6281 Muscle weakness (generalized): Secondary | ICD-10-CM | POA: Diagnosis not present

## 2021-07-10 DIAGNOSIS — I69398 Other sequelae of cerebral infarction: Secondary | ICD-10-CM | POA: Diagnosis not present

## 2021-07-10 DIAGNOSIS — J449 Chronic obstructive pulmonary disease, unspecified: Secondary | ICD-10-CM | POA: Diagnosis not present

## 2021-07-10 DIAGNOSIS — R29898 Other symptoms and signs involving the musculoskeletal system: Secondary | ICD-10-CM | POA: Diagnosis not present

## 2021-07-14 DIAGNOSIS — I10 Essential (primary) hypertension: Secondary | ICD-10-CM | POA: Diagnosis not present

## 2021-07-14 DIAGNOSIS — I69398 Other sequelae of cerebral infarction: Secondary | ICD-10-CM | POA: Diagnosis not present

## 2021-07-14 DIAGNOSIS — R2681 Unsteadiness on feet: Secondary | ICD-10-CM | POA: Diagnosis not present

## 2021-07-14 DIAGNOSIS — R29898 Other symptoms and signs involving the musculoskeletal system: Secondary | ICD-10-CM | POA: Diagnosis not present

## 2021-07-14 DIAGNOSIS — J449 Chronic obstructive pulmonary disease, unspecified: Secondary | ICD-10-CM | POA: Diagnosis not present

## 2021-07-14 DIAGNOSIS — M6281 Muscle weakness (generalized): Secondary | ICD-10-CM | POA: Diagnosis not present

## 2021-07-15 DIAGNOSIS — J449 Chronic obstructive pulmonary disease, unspecified: Secondary | ICD-10-CM | POA: Diagnosis not present

## 2021-07-15 DIAGNOSIS — I10 Essential (primary) hypertension: Secondary | ICD-10-CM | POA: Diagnosis not present

## 2021-07-15 DIAGNOSIS — I69398 Other sequelae of cerebral infarction: Secondary | ICD-10-CM | POA: Diagnosis not present

## 2021-07-15 DIAGNOSIS — R29898 Other symptoms and signs involving the musculoskeletal system: Secondary | ICD-10-CM | POA: Diagnosis not present

## 2021-07-15 DIAGNOSIS — M6281 Muscle weakness (generalized): Secondary | ICD-10-CM | POA: Diagnosis not present

## 2021-07-15 DIAGNOSIS — R2681 Unsteadiness on feet: Secondary | ICD-10-CM | POA: Diagnosis not present

## 2021-07-17 DIAGNOSIS — J449 Chronic obstructive pulmonary disease, unspecified: Secondary | ICD-10-CM | POA: Diagnosis not present

## 2021-07-17 DIAGNOSIS — R29898 Other symptoms and signs involving the musculoskeletal system: Secondary | ICD-10-CM | POA: Diagnosis not present

## 2021-07-17 DIAGNOSIS — M6281 Muscle weakness (generalized): Secondary | ICD-10-CM | POA: Diagnosis not present

## 2021-07-17 DIAGNOSIS — I69998 Other sequelae following unspecified cerebrovascular disease: Secondary | ICD-10-CM | POA: Diagnosis not present

## 2021-07-17 DIAGNOSIS — I10 Essential (primary) hypertension: Secondary | ICD-10-CM | POA: Diagnosis not present

## 2021-07-17 DIAGNOSIS — R278 Other lack of coordination: Secondary | ICD-10-CM | POA: Diagnosis not present

## 2021-07-20 DIAGNOSIS — I10 Essential (primary) hypertension: Secondary | ICD-10-CM | POA: Diagnosis not present

## 2021-07-20 DIAGNOSIS — R29898 Other symptoms and signs involving the musculoskeletal system: Secondary | ICD-10-CM | POA: Diagnosis not present

## 2021-07-20 DIAGNOSIS — M6281 Muscle weakness (generalized): Secondary | ICD-10-CM | POA: Diagnosis not present

## 2021-07-20 DIAGNOSIS — J449 Chronic obstructive pulmonary disease, unspecified: Secondary | ICD-10-CM | POA: Diagnosis not present

## 2021-07-20 DIAGNOSIS — I69998 Other sequelae following unspecified cerebrovascular disease: Secondary | ICD-10-CM | POA: Diagnosis not present

## 2021-07-20 DIAGNOSIS — R278 Other lack of coordination: Secondary | ICD-10-CM | POA: Diagnosis not present

## 2021-07-22 DIAGNOSIS — J449 Chronic obstructive pulmonary disease, unspecified: Secondary | ICD-10-CM | POA: Diagnosis not present

## 2021-07-22 DIAGNOSIS — R29898 Other symptoms and signs involving the musculoskeletal system: Secondary | ICD-10-CM | POA: Diagnosis not present

## 2021-07-22 DIAGNOSIS — M6281 Muscle weakness (generalized): Secondary | ICD-10-CM | POA: Diagnosis not present

## 2021-07-22 DIAGNOSIS — I69998 Other sequelae following unspecified cerebrovascular disease: Secondary | ICD-10-CM | POA: Diagnosis not present

## 2021-07-22 DIAGNOSIS — I10 Essential (primary) hypertension: Secondary | ICD-10-CM | POA: Diagnosis not present

## 2021-07-22 DIAGNOSIS — R278 Other lack of coordination: Secondary | ICD-10-CM | POA: Diagnosis not present

## 2021-07-24 DIAGNOSIS — R278 Other lack of coordination: Secondary | ICD-10-CM | POA: Diagnosis not present

## 2021-07-24 DIAGNOSIS — J449 Chronic obstructive pulmonary disease, unspecified: Secondary | ICD-10-CM | POA: Diagnosis not present

## 2021-07-24 DIAGNOSIS — M6281 Muscle weakness (generalized): Secondary | ICD-10-CM | POA: Diagnosis not present

## 2021-07-24 DIAGNOSIS — I69998 Other sequelae following unspecified cerebrovascular disease: Secondary | ICD-10-CM | POA: Diagnosis not present

## 2021-07-24 DIAGNOSIS — R29898 Other symptoms and signs involving the musculoskeletal system: Secondary | ICD-10-CM | POA: Diagnosis not present

## 2021-07-24 DIAGNOSIS — I10 Essential (primary) hypertension: Secondary | ICD-10-CM | POA: Diagnosis not present

## 2021-07-27 DIAGNOSIS — M6281 Muscle weakness (generalized): Secondary | ICD-10-CM | POA: Diagnosis not present

## 2021-07-27 DIAGNOSIS — R278 Other lack of coordination: Secondary | ICD-10-CM | POA: Diagnosis not present

## 2021-07-27 DIAGNOSIS — I69998 Other sequelae following unspecified cerebrovascular disease: Secondary | ICD-10-CM | POA: Diagnosis not present

## 2021-07-27 DIAGNOSIS — J449 Chronic obstructive pulmonary disease, unspecified: Secondary | ICD-10-CM | POA: Diagnosis not present

## 2021-07-27 DIAGNOSIS — R29898 Other symptoms and signs involving the musculoskeletal system: Secondary | ICD-10-CM | POA: Diagnosis not present

## 2021-07-27 DIAGNOSIS — I10 Essential (primary) hypertension: Secondary | ICD-10-CM | POA: Diagnosis not present

## 2021-07-28 DIAGNOSIS — L602 Onychogryphosis: Secondary | ICD-10-CM | POA: Diagnosis not present

## 2021-07-28 DIAGNOSIS — Q6689 Other  specified congenital deformities of feet: Secondary | ICD-10-CM | POA: Diagnosis not present

## 2021-07-29 DIAGNOSIS — I69998 Other sequelae following unspecified cerebrovascular disease: Secondary | ICD-10-CM | POA: Diagnosis not present

## 2021-07-29 DIAGNOSIS — M6281 Muscle weakness (generalized): Secondary | ICD-10-CM | POA: Diagnosis not present

## 2021-07-29 DIAGNOSIS — J449 Chronic obstructive pulmonary disease, unspecified: Secondary | ICD-10-CM | POA: Diagnosis not present

## 2021-07-29 DIAGNOSIS — R29898 Other symptoms and signs involving the musculoskeletal system: Secondary | ICD-10-CM | POA: Diagnosis not present

## 2021-07-29 DIAGNOSIS — I10 Essential (primary) hypertension: Secondary | ICD-10-CM | POA: Diagnosis not present

## 2021-07-29 DIAGNOSIS — R278 Other lack of coordination: Secondary | ICD-10-CM | POA: Diagnosis not present

## 2021-07-31 DIAGNOSIS — M6281 Muscle weakness (generalized): Secondary | ICD-10-CM | POA: Diagnosis not present

## 2021-07-31 DIAGNOSIS — R29898 Other symptoms and signs involving the musculoskeletal system: Secondary | ICD-10-CM | POA: Diagnosis not present

## 2021-07-31 DIAGNOSIS — I69998 Other sequelae following unspecified cerebrovascular disease: Secondary | ICD-10-CM | POA: Diagnosis not present

## 2021-07-31 DIAGNOSIS — J449 Chronic obstructive pulmonary disease, unspecified: Secondary | ICD-10-CM | POA: Diagnosis not present

## 2021-07-31 DIAGNOSIS — R278 Other lack of coordination: Secondary | ICD-10-CM | POA: Diagnosis not present

## 2021-07-31 DIAGNOSIS — I10 Essential (primary) hypertension: Secondary | ICD-10-CM | POA: Diagnosis not present

## 2021-08-03 DIAGNOSIS — M6281 Muscle weakness (generalized): Secondary | ICD-10-CM | POA: Diagnosis not present

## 2021-08-03 DIAGNOSIS — I10 Essential (primary) hypertension: Secondary | ICD-10-CM | POA: Diagnosis not present

## 2021-08-03 DIAGNOSIS — J449 Chronic obstructive pulmonary disease, unspecified: Secondary | ICD-10-CM | POA: Diagnosis not present

## 2021-08-03 DIAGNOSIS — R278 Other lack of coordination: Secondary | ICD-10-CM | POA: Diagnosis not present

## 2021-08-03 DIAGNOSIS — I69998 Other sequelae following unspecified cerebrovascular disease: Secondary | ICD-10-CM | POA: Diagnosis not present

## 2021-08-03 DIAGNOSIS — R29898 Other symptoms and signs involving the musculoskeletal system: Secondary | ICD-10-CM | POA: Diagnosis not present

## 2021-08-05 DIAGNOSIS — I69998 Other sequelae following unspecified cerebrovascular disease: Secondary | ICD-10-CM | POA: Diagnosis not present

## 2021-08-05 DIAGNOSIS — M6281 Muscle weakness (generalized): Secondary | ICD-10-CM | POA: Diagnosis not present

## 2021-08-05 DIAGNOSIS — R278 Other lack of coordination: Secondary | ICD-10-CM | POA: Diagnosis not present

## 2021-08-05 DIAGNOSIS — R29898 Other symptoms and signs involving the musculoskeletal system: Secondary | ICD-10-CM | POA: Diagnosis not present

## 2021-08-05 DIAGNOSIS — I10 Essential (primary) hypertension: Secondary | ICD-10-CM | POA: Diagnosis not present

## 2021-08-05 DIAGNOSIS — J449 Chronic obstructive pulmonary disease, unspecified: Secondary | ICD-10-CM | POA: Diagnosis not present

## 2021-08-07 DIAGNOSIS — J449 Chronic obstructive pulmonary disease, unspecified: Secondary | ICD-10-CM | POA: Diagnosis not present

## 2021-08-07 DIAGNOSIS — I10 Essential (primary) hypertension: Secondary | ICD-10-CM | POA: Diagnosis not present

## 2021-08-07 DIAGNOSIS — M6281 Muscle weakness (generalized): Secondary | ICD-10-CM | POA: Diagnosis not present

## 2021-08-07 DIAGNOSIS — R278 Other lack of coordination: Secondary | ICD-10-CM | POA: Diagnosis not present

## 2021-08-07 DIAGNOSIS — I69998 Other sequelae following unspecified cerebrovascular disease: Secondary | ICD-10-CM | POA: Diagnosis not present

## 2021-08-07 DIAGNOSIS — R29898 Other symptoms and signs involving the musculoskeletal system: Secondary | ICD-10-CM | POA: Diagnosis not present

## 2021-08-10 DIAGNOSIS — I10 Essential (primary) hypertension: Secondary | ICD-10-CM | POA: Diagnosis not present

## 2021-08-10 DIAGNOSIS — J449 Chronic obstructive pulmonary disease, unspecified: Secondary | ICD-10-CM | POA: Diagnosis not present

## 2021-08-10 DIAGNOSIS — R278 Other lack of coordination: Secondary | ICD-10-CM | POA: Diagnosis not present

## 2021-08-10 DIAGNOSIS — I69998 Other sequelae following unspecified cerebrovascular disease: Secondary | ICD-10-CM | POA: Diagnosis not present

## 2021-08-10 DIAGNOSIS — R29898 Other symptoms and signs involving the musculoskeletal system: Secondary | ICD-10-CM | POA: Diagnosis not present

## 2021-08-10 DIAGNOSIS — M6281 Muscle weakness (generalized): Secondary | ICD-10-CM | POA: Diagnosis not present

## 2021-08-12 DIAGNOSIS — I69998 Other sequelae following unspecified cerebrovascular disease: Secondary | ICD-10-CM | POA: Diagnosis not present

## 2021-08-12 DIAGNOSIS — M6281 Muscle weakness (generalized): Secondary | ICD-10-CM | POA: Diagnosis not present

## 2021-08-12 DIAGNOSIS — J449 Chronic obstructive pulmonary disease, unspecified: Secondary | ICD-10-CM | POA: Diagnosis not present

## 2021-08-12 DIAGNOSIS — R278 Other lack of coordination: Secondary | ICD-10-CM | POA: Diagnosis not present

## 2021-08-12 DIAGNOSIS — I10 Essential (primary) hypertension: Secondary | ICD-10-CM | POA: Diagnosis not present

## 2021-08-12 DIAGNOSIS — R29898 Other symptoms and signs involving the musculoskeletal system: Secondary | ICD-10-CM | POA: Diagnosis not present

## 2021-08-13 ENCOUNTER — Other Ambulatory Visit: Payer: Self-pay | Admitting: Adult Health

## 2021-08-13 DIAGNOSIS — G629 Polyneuropathy, unspecified: Secondary | ICD-10-CM

## 2021-08-13 DIAGNOSIS — M159 Polyosteoarthritis, unspecified: Secondary | ICD-10-CM

## 2021-08-13 MED ORDER — PREGABALIN 75 MG PO CAPS: 75.0000 mg | ORAL_CAPSULE | Freq: Every day | ORAL | 0 refills | Status: DC

## 2021-09-29 ENCOUNTER — Encounter: Payer: Self-pay | Admitting: Nurse Practitioner

## 2021-09-29 ENCOUNTER — Non-Acute Institutional Stay: Payer: Medicare Other | Admitting: Nurse Practitioner

## 2021-09-29 DIAGNOSIS — M159 Polyosteoarthritis, unspecified: Secondary | ICD-10-CM

## 2021-09-29 DIAGNOSIS — N390 Urinary tract infection, site not specified: Secondary | ICD-10-CM | POA: Diagnosis not present

## 2021-09-29 DIAGNOSIS — M797 Fibromyalgia: Secondary | ICD-10-CM | POA: Diagnosis not present

## 2021-09-29 DIAGNOSIS — J449 Chronic obstructive pulmonary disease, unspecified: Secondary | ICD-10-CM

## 2021-09-29 DIAGNOSIS — I63411 Cerebral infarction due to embolism of right middle cerebral artery: Secondary | ICD-10-CM

## 2021-09-29 DIAGNOSIS — D649 Anemia, unspecified: Secondary | ICD-10-CM

## 2021-09-29 DIAGNOSIS — I10 Essential (primary) hypertension: Secondary | ICD-10-CM | POA: Diagnosis not present

## 2021-09-29 DIAGNOSIS — R7303 Prediabetes: Secondary | ICD-10-CM

## 2021-09-29 DIAGNOSIS — J4489 Other specified chronic obstructive pulmonary disease: Secondary | ICD-10-CM

## 2021-09-29 DIAGNOSIS — R442 Other hallucinations: Secondary | ICD-10-CM | POA: Diagnosis not present

## 2021-09-29 DIAGNOSIS — K219 Gastro-esophageal reflux disease without esophagitis: Secondary | ICD-10-CM | POA: Diagnosis not present

## 2021-09-29 DIAGNOSIS — K5901 Slow transit constipation: Secondary | ICD-10-CM | POA: Diagnosis not present

## 2021-09-29 DIAGNOSIS — E876 Hypokalemia: Secondary | ICD-10-CM

## 2021-09-29 DIAGNOSIS — N952 Postmenopausal atrophic vaginitis: Secondary | ICD-10-CM

## 2021-09-29 DIAGNOSIS — H353130 Nonexudative age-related macular degeneration, bilateral, stage unspecified: Secondary | ICD-10-CM | POA: Diagnosis not present

## 2021-09-29 NOTE — Progress Notes (Signed)
Location:  [facility] Room Number: AL/917/A Place of Service:  ALF (13) Provider: Lenaya Pietsch X, NP  Patient Care Team: Jenevie Casstevens X, NP as PCP - General (Internal Medicine) Neidra Girvan X, NP as Nurse Practitioner (Internal Medicine)  Extended Emergency Contact Information Primary Emergency Contact: Pavlov,Jeff Home Phone: [phone] Relation: Son Secondary Emergency Contact: Culbreth Sr.,Christopher Home Phone: [phone] Relation: Relative  Code Status:  DNR Goals of care: Advanced Directive information    06/18/2021    3:41 PM  Advanced Directives  Does Patient Have a Medical Advance Directive? Yes  Type of Advance Directive Out of facility DNR (pink MOST or yellow form);Living will  Does patient want to make changes to medical advance directive? No - Patient declined     Chief Complaint  Patient presents with   Medical Management of Chronic Issues    Patient is here for a follow up for chronic conditions, discuss need for updates on vaccines    HPI:  Pt is a 88 y.o. female seen today for managing chronic medical conditions.     COPD, takes Symbicort, Zyrtec, Mucinex, DuoNeb, prn Albuterol, cough is chronic  Hx of CVA, CT x2 03/2021 showed no acute findings except age indeterminate right corona radiator infarct, small vessel changes. Neurology recommended no further work ups.              Macular degeneration, f/u [facility]: OU for Scotoma MD involving tcentral area. 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 Bun/creat 16/1.2 06/12/21             Urinary symptoms, on  Nitrofurantoin '50mg'$  qd for UTI suppression, Dr. Karsten Ro.              Estrace vaginal cream for atrophic vaginitis.              Anemia, Hx of Hgb 6.8 in hospital s/p 2 units of PRBC,  Vit B12, Iron, Hgb 11.8 06/12/21             GERD/chronic gastritis/atrophic gastritis,  GI bleed 02/2020, s/p EGD, per biopsy, takes Protonix. F/u GI prn Fibromyalgia takes Lyrica             Tactile hallucinations, off meds. TSH 0.661 03/19/21             Constipation, takes MiraLax 2x/wk. Colace daily.              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     Fibromyalgia    GERD 12/19/2006   Qualifier: Diagnosis of  By: Julien Girt CMA, Marliss Czar  04/13/16 TSH 1.20, Na 141, K 4.2, Bun 15, creat 0.79, wbc 5.5, Hgb 11.5, plt 283    GERD (gastroesophageal reflux disease)    Hammer toe of second toe of left foot 04/08/2016   Left 2nd   Hearing loss    Hemorrhoids    Hypercholesterolemia    Hypertension    Osteoarthritis 12/19/2006   Qualifier: Diagnosis of  By: Julien Girt CMA, Leigh     Osteoporosis    Past Surgical History:  Procedure Laterality Date   BIOPSY  02/17/2020   Procedure: BIOPSY;  Surgeon: Jackquline Denmark, MD;  Location: [facility];  Service: Endoscopy;;   CATARACT EXTRACTION, BILATERAL  2009   COLONOSCOPY  2009, 2006 , 2017   ESOPHAGOGASTRODUODENOSCOPY (EGD) WITH PROPOFOL N/A 02/17/2020   Procedure: ESOPHAGOGASTRODUODENOSCOPY (EGD) WITH PROPOFOL;  Surgeon: Jackquline Denmark, MD;  Location: [facility];  Service: Endoscopy;  Laterality: N/A;   IR ANGIOGRAM SELECTIVE EACH ADDITIONAL VESSEL  02/17/2020   IR ANGIOGRAM  SELECTIVE EACH ADDITIONAL VESSEL  02/17/2020   IR ANGIOGRAM SELECTIVE EACH ADDITIONAL VESSEL  02/17/2020   IR ANGIOGRAM SELECTIVE EACH ADDITIONAL VESSEL  02/17/2020   IR ANGIOGRAM SELECTIVE EACH ADDITIONAL VESSEL  02/17/2020   IR ANGIOGRAM VISCERAL SELECTIVE  02/17/2020   IR ANGIOGRAM VISCERAL SELECTIVE  02/17/2020   IR US GUIDE VASC ACCESS RIGHT  02/17/2020   stapes surgery     Dr Thornell Mule   TONSILLECTOMY AND ADENOIDECTOMY     as a child    Allergies  Allergen Reactions   Penicillins Anaphylaxis   Bisphosphonates Other (See Comments)    [name]   Influenza Vaccines     Unknown   Simvastatin     Unknown    Trazodone And Nefazodone     Felt her throat was closing up/kept her awake   Sulfonamide Derivatives Rash and Other (See Comments)    blisters    Outpatient Encounter Medications as of 09/29/2021  Medication Sig   albuterol (VENTOLIN HFA) 108 (90 Base) MCG/ACT inhaler Inhale into the lungs every 6 (six) hours as needed for wheezing or shortness of breath.   budesonide-formoterol (SYMBICORT) 80-4.5 MCG/ACT inhaler Inhale 1 puff into the lungs daily.   calcium carbonate (TUMS EX) 750 MG chewable tablet Chew 750 mg by mouth daily.    cetirizine (ZYRTEC) 10 MG tablet Take 1 tablet (10 mg total) by mouth daily.   cholecalciferol (VITAMIN D) 1000 UNITS tablet Take 1,000 Units by mouth daily.   cyclobenzaprine (FLEXERIL) 5 MG tablet Take 2.5 mg by mouth at bedtime.   docusate sodium (COLACE) 100 MG capsule Take 100 mg by mouth 2 (two) times daily.   ferrous sulfate 325 (65 FE) MG tablet Take 325 mg by mouth. Once A Day on Mon, Wed, Fri   guaiFENesin (MUCINEX) 600 MG 12 hr tablet Take 1 tablet (600 mg total) by mouth 2 (two) times daily.   hydrocortisone cream 1 % Apply 1 application topically 2 (two) times daily as needed (wrists).   hydrocortisone valerate cream (WESTCORT) 0.2 % Apply 1 application topically 2 (two) times daily as needed (for itching). Cleanse behind ears with gentle  cleanser and dry completely.   ipratropium-albuterol (DUONEB) 0.5-2.5 (3) MG/3ML SOLN Take 3 mLs by nebulization in the morning.   ipratropium-albuterol (DUONEB) 0.5-2.5 (3) MG/3ML SOLN Take 3 mLs  by nebulization daily as needed.   ketoconazole (NIZORAL) 2 % cream Apply 1 application topically 2 (two) times daily as needed for irritation.   metoprolol tartrate (LOPRESSOR) 25 MG tablet Take 12.5 mg by mouth 2 (two) times daily.   nitrofurantoin (MACRODANTIN) 50 MG capsule Take 50 mg by mouth at bedtime.   Olopatadine HCl 0.2 % SOLN Place 1 drop into both eyes daily.   polyethylene glycol (MIRALAX / GLYCOLAX) 17 g packet Take 17 g by mouth 2 (two) times a week. Sunday and Wednesday   pregabalin (LYRICA) 75 MG capsule Take 1 capsule (75 mg total) by mouth at bedtime.   Propylene Glycol 0.6 % SOLN Place 1 drop into both eyes 4 (four) times daily.   vitamin B-12 (CYANOCOBALAMIN) 1000 MCG tablet Take 1,000 mcg by mouth daily.   No facility-administered encounter medications on file as of 09/29/2021.    Review of Systems  Constitutional:  Negative for appetite change, fatigue and fever.  HENT:  Positive for hearing loss. Negative for congestion and voice change.   Eyes:  Negative for visual disturbance.       C/o blurred vision, ? Color, ? Right eye peripheral vision loss-f/u Ophthalmology  Respiratory:  Positive for cough. Negative for chest tightness and wheezing.   Cardiovascular:  Positive for leg swelling.  Gastrointestinal:  Negative for abdominal pain and constipation.  Genitourinary:  Negative for dysuria and urgency.       Chronic, on and off  Musculoskeletal:  Positive for arthralgias, back pain and gait problem. Negative for myalgias.  Skin:  Negative for color change.  Neurological:  Negative for speech difficulty, weakness and light-headedness.  Psychiatric/Behavioral:  Negative for behavioral problems, hallucinations and sleep disturbance.        Chronic going to bed late, sleeping  in late. Lethargic upon my examination.     Immunization History  Administered Date(s) Administered   Moderna SARS-COV2 Booster Vaccination 12/25/2019   Moderna Sars-Covid-2 Vaccination 02/17/2019, 03/17/2019   Pneumococcal Conjugate-13 09/01/2015   Pneumococcal Polysaccharide-23 07/07/1998   Td 08/21/2009   Pertinent  Health Maintenance Due  Topic Date Due   DEXA SCAN  Completed   INFLUENZA VACCINE  Discontinued      03/18/2021   10:52 AM 03/19/2021    3:00 AM 03/19/2021    8:00 AM 03/20/2021   12:00 AM 03/20/2021   12:00 PM  Fall Risk  Patient Fall Risk Level High fall risk High fall risk High fall risk High fall risk High fall risk   Functional Status Survey:    Vitals:   09/29/21 1133  BP: 135/70  Pulse: 62  Resp: 17  Temp: (!) 97.1 F (36.2 C)  SpO2: 94%  Weight: 142 lb (64.4 kg)  Height: '5\' 1"'$  (1.549 m)   Body mass index is 26.83 kg/m. Physical Exam Vitals and nursing note reviewed.  Constitutional:      Appearance: Normal appearance.  HENT:     Head: Normocephalic and atraumatic.     Nose: Nose normal.     Mouth/Throat:     Mouth: Mucous membranes are moist.  Eyes:     Extraocular Movements: Extraocular movements intact.     Conjunctiva/sclera: Conjunctivae normal.     Pupils: Pupils are equal, round, and reactive to light.     Comments: Able to count my fingers 3 feet away from her eyes.   Cardiovascular:     Rate and Rhythm: Normal rate and regular rhythm.     Heart sounds: No  murmur heard. Pulmonary:     Effort: Pulmonary effort is normal.     Breath sounds: No wheezing or rales.     Comments: Decreased air entry to both lungs.  Abdominal:     General: Bowel sounds are normal.     Palpations: Abdomen is soft.     Tenderness: There is no abdominal tenderness.  Musculoskeletal:     Cervical back: Normal range of motion and neck supple.     Right lower leg: Edema present.     Left lower leg: Edema present.     Comments: Trace edema BLE  Skin:     General: Skin is warm and dry.  Neurological:     General: No focal deficit present.     Mental Status: She is alert. Mental status is at baseline.     Motor: No weakness.     Coordination: Coordination normal.     Gait: Gait abnormal.     Comments: Oriented to person, place.   Psychiatric:        Mood and Affect: Mood normal.        Behavior: Behavior normal.     Labs reviewed: Recent Labs    03/18/21 1039 03/20/21 0456 03/24/21 0000 03/26/21 0000 04/09/21 0000 06/12/21 0000  NA 136 135   < > 140 135* 137  K 3.9 3.2*   < > 4.0 4.3 4.4  CL 101 106   < > 107 103 104  CO2 26 22   < > 27* 25* 25*  GLUCOSE 143* 96  --   --   --   --   BUN 18 20   < > '13 13 16  '$ CREATININE 0.99 0.70   < > 0.8 1.0 1.2*  CALCIUM 9.7 8.1*   < > 8.9 8.7 8.8   < > = values in this interval not displayed.   Recent Labs    03/18/21 1039 04/09/21 0000 06/12/21 0000  AST '22 13 16  '$ ALT '15 8 10  '$ ALKPHOS 87 80 82  BILITOT 0.9  --   --   PROT 7.7  --   --   ALBUMIN 4.5 3.8 4.0   Recent Labs    03/18/21 1039 03/20/21 0456 03/26/21 0000 04/09/21 0000 06/12/21 0000  WBC 7.2 7.8 5.5 5.2 3.8  NEUTROABS 6.2 6.0 3,641.00 4,113.00 2,466.00  HGB 13.7 11.1* 10.7* 11.5* 11.8*  HCT 41.3 34.1* 32* 35* 36  MCV 92.2 93.7  --   --   --   PLT 204 145* 207 198 180   Lab Results  Component Value Date   TSH 0.661 03/19/2021   Lab Results  Component Value Date   HGBA1C 6.0 (H) 03/19/2021   Lab Results  Component Value Date   CHOL 192 03/19/2021   HDL 69 03/19/2021   LDLCALC 107 (H) 03/19/2021   LDLDIRECT 114.7 08/18/2011   TRIG 78 03/19/2021   CHOLHDL 2.8 03/19/2021    Significant Diagnostic Results in last 30 days:  No results found.  Assessment/Plan HTN (hypertension) blood pressure is controlled on Metoprolol. Bun/creat 16/1.2 06/12/21  UTI (urinary tract infection) on  Nitrofurantoin '50mg'$  qd for UTI suppression, Dr. Karsten Ro.   Atrophic vaginitis Estrace vaginal cream for atrophic  vaginitis.   Chronic anemia Hx of Hgb 6.8 in hospital s/p 2 units of PRBC,  Vit B12, Iron, Hgb 11.8 06/12/21  GERD GERD/chronic gastritis/atrophic gastritis, GI bleed 02/2020, s/p EGD, per biopsy, takes Protonix. F/u GI prn  Fibromyalgia takes Lyrica  Tactile hallucination  Tactile hallucinations, off meds. TSH 0.661 03/19/21  Slow transit constipation  takes MiraLax 2x/wk. Colace daily.   Prediabetes Hgb a1c 6.0 03/19/21  Hypokalemia  K 4.4 06/12/21  Osteoarthritis Chronic lower back, leg pain, on Flexeril qhs, Lyrica   Macular degeneration, bilateral Macular degeneration, f/u [facility]: OU for [place] MD involving tcentral area. OS exudative age related MD  CVA (cerebral vascular accident) ([place]) Hx of CVA, CT x2 03/2021 showed no acute findings except age indeterminate right corona radiator infarct, small vessel changes. Neurology recommended no further work ups.  COPD (chronic obstructive pulmonary disease) with chronic bronchitis (HCC) takes Symbicort, Zyrtec, Mucinex, DuoNeb, prn Albuterol, cough is chronic      Family/ staff Communication: plan of care reviewed with the patient and charge nurse.   Labs/tests ordered:  none  Time spend 40 minutes.

## 2021-10-06 ENCOUNTER — Encounter: Payer: Self-pay | Admitting: Nurse Practitioner

## 2021-10-06 NOTE — Assessment & Plan Note (Signed)
GERD/chronic gastritis/atrophic gastritis, GI bleed 02/2020, s/p EGD, per biopsy, takes Protonix. F/u GI prn 

## 2021-10-06 NOTE — Assessment & Plan Note (Signed)
Macular degeneration, f/u [facility]: OU for Scotoma MD involving tcentral area. OS exudative age related MD 

## 2021-10-06 NOTE — Assessment & Plan Note (Signed)
Hx of Hgb 6.8 in hospital s/p 2 units of PRBC,  Vit B12, Iron, Hgb 11.8 06/12/21 

## 2021-10-06 NOTE — Assessment & Plan Note (Signed)
K 4.4 06/12/21

## 2021-10-06 NOTE — Assessment & Plan Note (Signed)
Hgb a1c 6.0 03/19/21 

## 2021-10-06 NOTE — Assessment & Plan Note (Signed)
Estrace vaginal cream for atrophic vaginitis.  

## 2021-10-06 NOTE — Assessment & Plan Note (Signed)
Tactile hallucinations, off meds. TSH 0.661 03/19/21

## 2021-10-06 NOTE — Assessment & Plan Note (Signed)
takes Symbicort, Zyrtec, Mucinex, DuoNeb, prn Albuterol, cough is chronic

## 2021-10-06 NOTE — Assessment & Plan Note (Signed)
Chronic lower back, leg pain, on Flexeril qhs, Lyrica  

## 2021-10-06 NOTE — Assessment & Plan Note (Signed)
on  Nitrofurantoin 50mg qd for UTI suppression, Dr. Ottelin.  

## 2021-10-06 NOTE — Assessment & Plan Note (Signed)
Hx of CVA, CT x2 03/2021 showed no acute findings except age indeterminate right corona radiator infarct, small vessel changes. Neurology recommended no further work ups. 

## 2021-10-06 NOTE — Assessment & Plan Note (Signed)
takes MiraLax 2x/wk. Colace daily.

## 2021-10-06 NOTE — Assessment & Plan Note (Signed)
takes Lyrica

## 2021-10-06 NOTE — Assessment & Plan Note (Signed)
blood pressure is controlled on Metoprolol. Bun/creat 16/1.2 06/12/21 

## 2021-10-07 ENCOUNTER — Other Ambulatory Visit: Payer: Self-pay | Admitting: Adult Health

## 2021-10-07 DIAGNOSIS — G629 Polyneuropathy, unspecified: Secondary | ICD-10-CM

## 2021-10-07 DIAGNOSIS — M159 Polyosteoarthritis, unspecified: Secondary | ICD-10-CM

## 2021-10-07 MED ORDER — PREGABALIN 75 MG PO CAPS: 75.0000 mg | ORAL_CAPSULE | Freq: Every day | ORAL | 0 refills | Status: DC

## 2021-11-10 ENCOUNTER — Telehealth: Payer: Self-pay

## 2021-11-10 NOTE — Telephone Encounter (Signed)
Refill request received from [facility] for Lyrica '75mg'$  tablet one daily at bedtime.  Medication pended and sent to [place], NP for approval.

## 2021-11-18 ENCOUNTER — Other Ambulatory Visit: Payer: Self-pay | Admitting: Adult Health

## 2021-11-18 MED ORDER — TRAMADOL HCL 50 MG PO TABS: 25.0000 mg | ORAL_TABLET | Freq: Three times a day (TID) | ORAL | 0 refills | Status: DC | PRN

## 2021-11-20 ENCOUNTER — Non-Acute Institutional Stay: Payer: Medicare Other | Admitting: Nurse Practitioner

## 2021-11-20 ENCOUNTER — Encounter: Payer: Self-pay | Admitting: Nurse Practitioner

## 2021-11-20 DIAGNOSIS — M797 Fibromyalgia: Secondary | ICD-10-CM

## 2021-11-20 DIAGNOSIS — I63411 Cerebral infarction due to embolism of right middle cerebral artery: Secondary | ICD-10-CM

## 2021-11-20 DIAGNOSIS — H353130 Nonexudative age-related macular degeneration, bilateral, stage unspecified: Secondary | ICD-10-CM | POA: Diagnosis not present

## 2021-11-20 DIAGNOSIS — D649 Anemia, unspecified: Secondary | ICD-10-CM | POA: Diagnosis not present

## 2021-11-20 DIAGNOSIS — K5901 Slow transit constipation: Secondary | ICD-10-CM | POA: Diagnosis not present

## 2021-11-20 DIAGNOSIS — M159 Polyosteoarthritis, unspecified: Secondary | ICD-10-CM

## 2021-11-20 DIAGNOSIS — N390 Urinary tract infection, site not specified: Secondary | ICD-10-CM | POA: Diagnosis not present

## 2021-11-20 DIAGNOSIS — R0602 Shortness of breath: Secondary | ICD-10-CM | POA: Diagnosis not present

## 2021-11-20 DIAGNOSIS — R7303 Prediabetes: Secondary | ICD-10-CM

## 2021-11-20 DIAGNOSIS — K219 Gastro-esophageal reflux disease without esophagitis: Secondary | ICD-10-CM | POA: Diagnosis not present

## 2021-11-20 DIAGNOSIS — J4489 Other specified chronic obstructive pulmonary disease: Secondary | ICD-10-CM

## 2021-11-20 DIAGNOSIS — I10 Essential (primary) hypertension: Secondary | ICD-10-CM | POA: Diagnosis not present

## 2021-11-20 DIAGNOSIS — N952 Postmenopausal atrophic vaginitis: Secondary | ICD-10-CM

## 2021-11-20 NOTE — Progress Notes (Signed)
Location:   [facility] Room Number: 203 Place of Service:  ALF (13) Provider: Lennie Odor Nance Mccombs NP  Jazmaine Fuelling X, NP  Patient Care Team: Adalin Vanderploeg X, NP as PCP - General (Internal Medicine) Steele Ledonne X, NP as Nurse Practitioner (Internal Medicine)  Extended Emergency Contact Information Primary Emergency Contact: Kelley,Jeff Home Phone: [phone] Relation: Son Secondary Emergency Contact: Culbreth Sr.,Christopher Home Phone: [phone] Relation: Relative  Code Status: DNR Goals of care: Advanced Directive information    06/18/2021    3:41 PM  Advanced Directives  Does Patient Have a Medical Advance Directive? Yes  Type of Advance Directive Out of facility DNR (pink MOST or yellow form);Living will  Does patient want to make changes to medical advance directive? No - Patient declined     Chief Complaint  Patient presents with  . Acute Visit    Increased SOB    HPI:  Pt is a 88 y.o. female seen today for an acute visit for increased SOB, denied cough or chest pain or phlegm production, afebrile, no O2 desaturation     COPD, takes Symbicort, Zyrtec, Mucinex, DuoNeb, prn Albuterol, cough is chronic  Hx of CVA, CT x2 03/2021 showed no acute findings except age indeterminate right corona radiator infarct, small vessel changes. Neurology recommended no further work ups.              Macular degeneration, f/u [facility]: OU for [place] MD involving tcentral area. 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 Bun/creat 16/1.2 06/12/21             Urinary symptoms, on  Nitrofurantoin '50mg'$  qd for UTI suppression, Dr. Karsten Ro.              Estrace vaginal cream for atrophic vaginitis.              Anemia, Hx of Hgb 6.8 in hospital s/p 2 units of PRBC,  Vit B12, Iron, Hgb 11.8 06/12/21             GERD/chronic gastritis/atrophic gastritis, GI bleed 02/2020, s/p EGD, per  biopsy, takes Protonix. F/u GI prn Fibromyalgia takes Lyrica             Tactile hallucinations, off meds. TSH 0.661 03/19/21             Constipation, takes MiraLax 2x/wk. Colace daily.              Prediabetes, Hgb a1c 6.0 03/19/21             Hypokalemia, K 4.4 06/12/21  Past Medical History:  Diagnosis Date  . Acute blood loss anemia 08/23/2013   04/13/16 TSH 1.20, Na 141, K 4.2, Bun 15, creat 0.79, wbc 5.5, Hgb 11.5, plt 283  . Anxiety   . Anxiety state 07/02/2007   Qualifier: Diagnosis of  By: Lenna Gilford MD, Deborra Medina   . Asthma   . 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 Cigarette smoker   . Colon polyps 2009   TWO TUBULAR ADENOMAS AND HYPERPLASTIC POLYPS  . Constipation 09/14/2013  . COPD (chronic obstructive pulmonary disease) ([place])   . Diverticulosis of colon   . DJD (degenerative joint disease)   . Dog bite of limb 07/14/2010   Dog bite 07/10/10 - dogs  shots utd Last td was 08/2009  Localized tx only.    . Fibromyalgia   . GERD 12/19/2006   Qualifier: Diagnosis of  By: Julien Girt CMA, Marliss Czar  04/13/16 TSH 1.20, Na 141, K 4.2, Bun 15, creat 0.79, wbc 5.5, Hgb 11.5, plt 283   . GERD (gastroesophageal reflux disease)   . Hammer toe of second toe of left foot 04/08/2016   Left 2nd  . Hearing loss   . Hemorrhoids   . Hypercholesterolemia   . Hypertension   . Osteoarthritis 12/19/2006   Qualifier: Diagnosis of  By: Julien Girt CMA, Marliss Czar    . Osteoporosis    Past Surgical History:  Procedure Laterality Date  . BIOPSY  02/17/2020   Procedure: BIOPSY;  Surgeon: Jackquline Denmark, MD;  Location: [facility];  Service: Endoscopy;;  . CATARACT EXTRACTION, BILATERAL  2009  . COLONOSCOPY  2009, 2006 , 2017  . ESOPHAGOGASTRODUODENOSCOPY (EGD) WITH PROPOFOL N/A 02/17/2020   Procedure: ESOPHAGOGASTRODUODENOSCOPY (EGD) WITH PROPOFOL;  Surgeon: Jackquline Denmark, MD;  Location: [facility];  Service: Endoscopy;  Laterality: N/A;  . IR ANGIOGRAM SELECTIVE EACH ADDITIONAL VESSEL  02/17/2020  . IR ANGIOGRAM  SELECTIVE EACH ADDITIONAL VESSEL  02/17/2020  . IR ANGIOGRAM SELECTIVE EACH ADDITIONAL VESSEL  02/17/2020  . IR ANGIOGRAM SELECTIVE EACH ADDITIONAL VESSEL  02/17/2020  . IR ANGIOGRAM SELECTIVE EACH ADDITIONAL VESSEL  02/17/2020  . IR ANGIOGRAM VISCERAL SELECTIVE  02/17/2020  . IR ANGIOGRAM VISCERAL SELECTIVE  02/17/2020  . IR US GUIDE VASC ACCESS RIGHT  02/17/2020  . stapes surgery     Dr Thornell Mule  . TONSILLECTOMY AND ADENOIDECTOMY     as a child    Allergies  Allergen Reactions  . Penicillins Anaphylaxis  . Bisphosphonates Other (See Comments)    [name]  . Influenza Vaccines     Unknown  . Simvastatin     Unknown   . Trazodone And Nefazodone     Felt her throat was closing up/kept her awake  . Sulfonamide Derivatives Rash and Other (See Comments)    blisters    Allergies as of 11/20/2021       Reactions   Penicillins Anaphylaxis   Bisphosphonates Other (See Comments)   [name]   Influenza Vaccines    Unknown   Simvastatin    Unknown    Trazodone And Nefazodone    Felt her throat was closing up/kept her awake   Sulfonamide Derivatives Rash, Other (See Comments)   blisters        Medication List        Accurate as of November 20, 2021 11:59 PM. If you have any questions, ask your nurse or doctor.          albuterol 108 (90 Base) MCG/ACT inhaler Commonly known as: VENTOLIN HFA Inhale into the lungs every 6 (six) hours as needed for wheezing or shortness of breath.   budesonide-formoterol 80-4.5 MCG/ACT inhaler Commonly known as: SYMBICORT Inhale 1 puff into the lungs daily.   calcium carbonate 750 MG chewable tablet Commonly known as: TUMS EX Chew 750 mg by mouth daily.   cetirizine 10 MG tablet Commonly known as: ZYRTEC Take 1 tablet (10 mg total) by mouth daily.   cholecalciferol 1000 units tablet Commonly known as: VITAMIN D Take 1,000 Units by mouth daily.   cyanocobalamin 1000 MCG tablet Commonly known as: VITAMIN B12 Take 1,000 mcg by  mouth daily.   cyclobenzaprine 5 MG tablet Commonly known as: FLEXERIL Take 2.5 mg by mouth  at bedtime.   docusate sodium 100 MG capsule Commonly known as: COLACE Take 100 mg by mouth 2 (two) times daily.   ferrous sulfate 325 (65 FE) MG tablet Take 325 mg by mouth. Once A Day on Mon, Wed, Fri   guaiFENesin 600 MG 12 hr tablet Commonly known as: Mucinex Take 1 tablet (600 mg total) by mouth 2 (two) times daily.   hydrocortisone cream 1 % Apply 1 application topically 2 (two) times daily as needed (wrists).   hydrocortisone valerate cream 0.2 % Commonly known as: WESTCORT Apply 1 application topically 2 (two) times daily as needed (for itching). Cleanse behind ears with gentle cleanser and dry completely.   ipratropium-albuterol 0.5-2.5 (3) MG/3ML Soln Commonly known as: DUONEB Take 3 mLs by nebulization in the morning.   ipratropium-albuterol 0.5-2.5 (3) MG/3ML Soln Commonly known as: DUONEB Take 3 mLs by nebulization daily as needed.   ketoconazole 2 % cream Commonly known as: NIZORAL Apply 1 application topically 2 (two) times daily as needed for irritation.   metoprolol tartrate 25 MG tablet Commonly known as: LOPRESSOR Take 12.5 mg by mouth 2 (two) times daily.   nitrofurantoin 50 MG capsule Commonly known as: MACRODANTIN Take 50 mg by mouth at bedtime.   Olopatadine HCl 0.2 % Soln Place 1 drop into both eyes daily.   polyethylene glycol 17 g packet Commonly known as: MIRALAX / GLYCOLAX Take 17 g by mouth 2 (two) times a week. Sunday and Wednesday   pregabalin 75 MG capsule Commonly known as: LYRICA Take 1 capsule (75 mg total) by mouth at bedtime.   Propylene Glycol 0.6 % Soln Place 1 drop into both eyes 4 (four) times daily.   traMADol 50 MG tablet Commonly known as: ULTRAM Take 0.5 tablets (25 mg total) by mouth every 8 (eight) hours as needed.        Review of Systems  Constitutional:  Negative for appetite change, fatigue and fever.  HENT:   Positive for hearing loss. Negative for congestion and voice change.   Eyes:  Negative for visual disturbance.       C/o blurred vision, ? Color, ? Right eye peripheral vision loss-f/u Ophthalmology  Respiratory:  Positive for cough and shortness of breath. Negative for chest tightness and wheezing.        DOE  Cardiovascular:  Positive for leg swelling.  Gastrointestinal:  Negative for abdominal pain and constipation.  Genitourinary:  Negative for dysuria and urgency.       Chronic, on and off  Musculoskeletal:  Positive for arthralgias, back pain and gait problem. Negative for myalgias.  Skin:  Negative for color change.  Neurological:  Negative for speech difficulty, weakness and light-headedness.  Psychiatric/Behavioral:  Negative for behavioral problems, hallucinations and sleep disturbance.        Chronic going to bed late, sleeping in late. Lethargic upon my examination.     Immunization History  Administered Date(s) Administered  . Moderna SARS-COV2 Booster Vaccination 12/25/2019  . Moderna Sars-Covid-2 Vaccination 02/17/2019, 03/17/2019  . Pneumococcal Conjugate-13 09/01/2015  . Pneumococcal Polysaccharide-23 07/07/1998  . Td 08/21/2009   Pertinent  Health Maintenance Due  Topic Date Due  . DEXA SCAN  Completed  . INFLUENZA VACCINE  Discontinued      03/18/2021   10:52 AM 03/19/2021    3:00 AM 03/19/2021    8:00 AM 03/20/2021   12:00 AM 03/20/2021   12:00 PM  Fall Risk  Patient Fall Risk Level High fall risk High fall risk  High fall risk High fall risk High fall risk   Functional Status Survey:    Vitals:   11/20/21 1635  BP: (!) 148/72  Pulse: (!) 58  Resp: 18  Temp: (!) 97.1 F (36.2 C)  SpO2: 93%   There is no height or weight on file to calculate BMI. Physical Exam Vitals and nursing note reviewed.  Constitutional:      Appearance: Normal appearance.  HENT:     Head: Normocephalic and atraumatic.     Nose: Nose normal.     Mouth/Throat:     Mouth: Mucous  membranes are moist.  Eyes:     Extraocular Movements: Extraocular movements intact.     Conjunctiva/sclera: Conjunctivae normal.     Pupils: Pupils are equal, round, and reactive to light.     Comments: Able to count my fingers 3 feet away from her eyes.   Cardiovascular:     Rate and Rhythm: Normal rate and regular rhythm.     Heart sounds: No murmur heard. Pulmonary:     Effort: Pulmonary effort is normal.     Breath sounds: No wheezing or rales.     Comments: Decreased air entry to both lungs.  Abdominal:     General: Bowel sounds are normal.     Palpations: Abdomen is soft.     Tenderness: There is no abdominal tenderness.  Musculoskeletal:     Cervical back: Normal range of motion and neck supple.     Right lower leg: Edema present.     Left lower leg: Edema present.     Comments: Trace edema BLE  Skin:    General: Skin is warm and dry.  Neurological:     General: No focal deficit present.     Mental Status: She is alert. Mental status is at baseline.     Motor: No weakness.     Coordination: Coordination normal.     Gait: Gait abnormal.     Comments: Oriented to person, place.   Psychiatric:        Mood and Affect: Mood normal.        Behavior: Behavior normal.    Labs reviewed: Recent Labs    03/18/21 1039 03/20/21 0456 03/24/21 0000 03/26/21 0000 04/09/21 0000 06/12/21 0000  NA 136 135   < > 140 135* 137  K 3.9 3.2*   < > 4.0 4.3 4.4  CL 101 106   < > 107 103 104  CO2 26 22   < > 27* 25* 25*  GLUCOSE 143* 96  --   --   --   --   BUN 18 20   < > '13 13 16  '$ CREATININE 0.99 0.70   < > 0.8 1.0 1.2*  CALCIUM 9.7 8.1*   < > 8.9 8.7 8.8   < > = values in this interval not displayed.   Recent Labs    03/18/21 1039 04/09/21 0000 06/12/21 0000  AST '22 13 16  '$ ALT '15 8 10  '$ ALKPHOS 87 80 82  BILITOT 0.9  --   --   PROT 7.7  --   --   ALBUMIN 4.5 3.8 4.0   Recent Labs    03/18/21 1039 03/20/21 0456 03/26/21 0000 04/09/21 0000 06/12/21 0000  WBC 7.2  7.8 5.5 5.2 3.8  NEUTROABS 6.2 6.0 3,641.00 4,113.00 2,466.00  HGB 13.7 11.1* 10.7* 11.5* 11.8*  HCT 41.3 34.1* 32* 35* 36  MCV 92.2 93.7  --   --   --  PLT 204 145* 207 198 180   Lab Results  Component Value Date   TSH 0.661 03/19/2021   Lab Results  Component Value Date   HGBA1C 6.0 (H) 03/19/2021   Lab Results  Component Value Date   CHOL 192 03/19/2021   HDL 69 03/19/2021   LDLCALC 107 (H) 03/19/2021   LDLDIRECT 114.7 08/18/2011   TRIG 78 03/19/2021   CHOLHDL 2.8 03/19/2021    Significant Diagnostic Results in last 30 days:  No results found.  Assessment/Plan: COPD (chronic obstructive pulmonary disease) with chronic bronchitis increased SOB, denied cough or chest pain or phlegm production, afebrile, no O2 desaturation. COPD, takes Symbicort, Zyrtec, Mucinex, DuoNeb, prn Albuterol, cough is chronic. CXR 11/20/21 no evidence of acute pulmonary disease. Will ty Prednisone '10mg'$  qd for now.   CVA (cerebral vascular accident) [facility]) CT x2 03/2021 showed no acute findings except age indeterminate right corona radiator infarct, small vessel changes. Neurology recommended no further work ups.  Macular degeneration, bilateral  Macular degeneration, f/u [facility]: OU for [place] MD involving tcentral area. OS exudative age related MD  Osteoarthritis   Chronic lower back, leg pain, on Flexeril qhs, Lyrica   HTN (hypertension) blood pressure is controlled on Metoprolol. Bun/creat 16/1.2 06/12/21  UTI (urinary tract infection) on  Nitrofurantoin '50mg'$  qd for UTI suppression, Dr. Karsten Ro.   Atrophic vaginitis     Estrace vaginal cream for atrophic vaginitis.   Chronic anemia Hx of Hgb 6.8 in hospital s/p 2 units of PRBC,  Vit B12, Iron, Hgb 11.8 06/12/21  GERD  GERD/chronic gastritis/atrophic gastritis, GI bleed 02/2020, s/p EGD, per biopsy, takes Protonix. F/u GI prn  Fibromyalgia takes Lyrica  Slow transit constipation Stable, takes MiraLax 2x/wk. Colace daily.    Prediabetes Diet controlled, Hgb a1c 6.0 03/19/21    Family/ staff Communication: plan of care reviewed with the patient and charge nurse.   Labs/tests ordered:  CXR ap/lateral.   Time spend 40 minutes.

## 2021-11-23 NOTE — Assessment & Plan Note (Signed)
increased SOB, denied cough or chest pain or phlegm production, afebrile, no O2 desaturation. COPD, takes Symbicort, Zyrtec, Mucinex, DuoNeb, prn Albuterol, cough is chronic. CXR 11/20/21 no evidence of acute pulmonary disease. Will ty Prednisone '10mg'$  qd for now.

## 2021-11-23 NOTE — Assessment & Plan Note (Signed)
Diet controlled, Hgb a1c 6.0 03/19/21

## 2021-11-23 NOTE — Assessment & Plan Note (Signed)
Macular degeneration, f/u [facility]: OU for Scotoma MD involving tcentral area. OS exudative age related MD 

## 2021-11-23 NOTE — Assessment & Plan Note (Signed)
Stable,  takes MiraLax 2x/wk. Colace daily.  

## 2021-11-23 NOTE — Assessment & Plan Note (Signed)
Chronic lower back, leg pain, on Flexeril qhs, Lyrica  

## 2021-11-23 NOTE — Assessment & Plan Note (Signed)
blood pressure is controlled on Metoprolol. Bun/creat 16/1.2 06/12/21 

## 2021-11-23 NOTE — Assessment & Plan Note (Signed)
Estrace vaginal cream for atrophic vaginitis.  

## 2021-11-23 NOTE — Assessment & Plan Note (Signed)
CT x2 03/2021 showed no acute findings except age indeterminate right corona radiator infarct, small vessel changes. Neurology recommended no further work ups. 

## 2021-11-23 NOTE — Assessment & Plan Note (Signed)
on  Nitrofurantoin 50mg qd for UTI suppression, Dr. Ottelin.  

## 2021-11-23 NOTE — Assessment & Plan Note (Signed)
takes Lyrica

## 2021-11-23 NOTE — Assessment & Plan Note (Signed)
Hx of Hgb 6.8 in hospital s/p 2 units of PRBC,  Vit B12, Iron, Hgb 11.8 06/12/21 

## 2021-11-23 NOTE — Assessment & Plan Note (Signed)
GERD/chronic gastritis/atrophic gastritis, GI bleed 02/2020, s/p EGD, per biopsy, takes Protonix. F/u GI prn 

## 2021-12-09 ENCOUNTER — Other Ambulatory Visit: Payer: Self-pay | Admitting: Adult Health

## 2021-12-09 DIAGNOSIS — G629 Polyneuropathy, unspecified: Secondary | ICD-10-CM

## 2021-12-09 DIAGNOSIS — M159 Polyosteoarthritis, unspecified: Secondary | ICD-10-CM

## 2021-12-09 MED ORDER — PREGABALIN 75 MG PO CAPS: 75.0000 mg | ORAL_CAPSULE | Freq: Every day | ORAL | 0 refills | Status: DC

## 2021-12-21 ENCOUNTER — Non-Acute Institutional Stay: Payer: Medicare Other | Admitting: Nurse Practitioner

## 2021-12-21 DIAGNOSIS — R442 Other hallucinations: Secondary | ICD-10-CM

## 2021-12-21 DIAGNOSIS — K219 Gastro-esophageal reflux disease without esophagitis: Secondary | ICD-10-CM | POA: Diagnosis not present

## 2021-12-21 DIAGNOSIS — R0789 Other chest pain: Secondary | ICD-10-CM

## 2021-12-21 DIAGNOSIS — J4489 Other specified chronic obstructive pulmonary disease: Secondary | ICD-10-CM | POA: Diagnosis not present

## 2021-12-21 DIAGNOSIS — H353130 Nonexudative age-related macular degeneration, bilateral, stage unspecified: Secondary | ICD-10-CM

## 2021-12-21 DIAGNOSIS — K5901 Slow transit constipation: Secondary | ICD-10-CM

## 2021-12-21 DIAGNOSIS — I10 Essential (primary) hypertension: Secondary | ICD-10-CM | POA: Diagnosis not present

## 2021-12-21 DIAGNOSIS — N952 Postmenopausal atrophic vaginitis: Secondary | ICD-10-CM

## 2021-12-21 DIAGNOSIS — R0781 Pleurodynia: Secondary | ICD-10-CM | POA: Diagnosis not present

## 2021-12-21 DIAGNOSIS — M797 Fibromyalgia: Secondary | ICD-10-CM

## 2021-12-21 DIAGNOSIS — M159 Polyosteoarthritis, unspecified: Secondary | ICD-10-CM

## 2021-12-21 DIAGNOSIS — D649 Anemia, unspecified: Secondary | ICD-10-CM

## 2021-12-21 DIAGNOSIS — R0602 Shortness of breath: Secondary | ICD-10-CM | POA: Diagnosis not present

## 2021-12-21 DIAGNOSIS — I63411 Cerebral infarction due to embolism of right middle cerebral artery: Secondary | ICD-10-CM

## 2021-12-21 DIAGNOSIS — R7303 Prediabetes: Secondary | ICD-10-CM

## 2021-12-21 MED ORDER — PREDNISONE 10 MG PO TABS: 5.0000 mg | ORAL_TABLET | Freq: Every day | ORAL | 3 refills | Status: DC

## 2021-12-21 NOTE — Assessment & Plan Note (Addendum)
CXR, X-ray left ribs, pain palpated under the left breast, no associated with deep breathing or movement. The patient stated she had leaned over chair to pick up earring and heard a crack 4 days ago will schedule Tramadol '25mg'$  tid x 1 week in addition to prn. Observe.  12/21/21 CXR, L ribs, no acute findings.

## 2021-12-21 NOTE — Assessment & Plan Note (Signed)
diffused mild expiratory wheezes, c/o SOB, uses O2 more than prior,  takes Symbicort, Zyrtec, Mucinex, DuoNeb, prn Albuterol, cough is chronic, try Prednisone low dose @ '5mg'$  qd. CXR 11/20/21 no acute pulmonary disease, repeat CXR in setting of c/o anterior left chest wall pain.

## 2021-12-21 NOTE — Assessment & Plan Note (Signed)
No mentioned by the patient, off meds. TSH 0.661 03/19/21

## 2021-12-21 NOTE — Assessment & Plan Note (Signed)
CT x2 03/2021 showed no acute findings except age indeterminate right corona radiator infarct, small vessel changes. Neurology recommended no further work ups.

## 2021-12-21 NOTE — Assessment & Plan Note (Signed)
Stable, takes MiraLax 2x/wk. Colace daily.

## 2021-12-21 NOTE — Assessment & Plan Note (Signed)
Chronic lower back, leg pain, on Flexeril qhs, Lyrica  

## 2021-12-21 NOTE — Assessment & Plan Note (Signed)
Hx of Hgb 6.8 in hospital s/p 2 units of PRBC,  Vit B12, Iron, Hgb 11.8 06/12/21

## 2021-12-21 NOTE — Assessment & Plan Note (Signed)
GERD/chronic gastritis/atrophic gastritis, GI bleed 02/2020, s/p EGD, per biopsy, takes Protonix. F/u GI prn

## 2021-12-21 NOTE — Assessment & Plan Note (Signed)
on  Nitrofurantoin 50mg qd for UTI suppression, Dr. Ottelin. Estrace vaginal cream for atrophic vaginitis.  

## 2021-12-21 NOTE — Assessment & Plan Note (Signed)
Stable, takes Lyrica

## 2021-12-21 NOTE — Assessment & Plan Note (Signed)
Hgb a1c 6.0 03/19/21

## 2021-12-21 NOTE — Progress Notes (Unsigned)
Location:   [facility] Room Number: 300 Place of Service:  ALF (13) Provider: Lennie Odor Shree Espey NP  Shams Fill X, NP  Patient Care Team: Belicia Difatta X, NP as PCP - General (Internal Medicine) Markitta Ausburn X, NP as Nurse Practitioner (Internal Medicine)  Extended Emergency Contact Information Primary Emergency Contact: Noguchi,Jeff Home Phone: [phone] Relation: Son Secondary Emergency Contact: Culbreth Sr.,Christopher Home Phone: [phone] Relation: Relative  Code Status: DNR Goals of care: Advanced Directive information    06/18/2021    3:41 PM  Advanced Directives  Does Patient Have a Medical Advance Directive? Yes  Type of Advance Directive Out of facility DNR (pink MOST or yellow form);Living will  Does patient want to make changes to medical advance directive? No - Patient declined     Chief Complaint  Patient presents with   Acute Visit    Chest wall pain under the left breast sustained from leaned over chair about 4-5 days ago    HPI:  Pt is a 88 y.o. female seen today for an acute visit for c/o left chest wall pain when palpated, not associated with movement or deep breathing. The patient stated she had leaned over chair to pick up earring and heard a crack 4 days ago    COPD, diffused mild expiratory wheezes, c/o SOB, uses O2 more than prior,  takes Symbicort, Zyrtec, Mucinex, DuoNeb, prn Albuterol, cough is chronic, try Prednisone low dose.  Hx of CVA, CT x2 03/2021 showed no acute findings except age indeterminate right corona radiator infarct, small vessel changes. Neurology recommended no further work ups.              Macular degeneration, f/u [facility]: OU for [place] MD involving tcentral area. 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 Bun/creat 16/1.2 06/12/21             Urinary symptoms, on  Nitrofurantoin '50mg'$  qd for UTI suppression,  Dr. Karsten Ro.              Estrace vaginal cream for atrophic vaginitis.              Anemia, Hx of Hgb 6.8 in hospital s/p 2 units of PRBC,  Vit B12, Iron, Hgb 11.8 06/12/21             GERD/chronic gastritis/atrophic gastritis, GI bleed 02/2020, s/p EGD, per biopsy, takes Protonix. F/u GI prn Fibromyalgia takes Lyrica             Tactile hallucinations, off meds. TSH 0.661 03/19/21             Constipation, takes MiraLax 2x/wk. Colace daily.              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     Fibromyalgia    GERD 12/19/2006   Qualifier: Diagnosis of  By: Julien Girt CMA, Marliss Czar  04/13/16 TSH 1.20, Na 141, K 4.2, Bun 15, creat 0.79, wbc 5.5, Hgb 11.5, plt 283    GERD (gastroesophageal reflux disease)    Hammer toe of second toe of left foot 04/08/2016   Left 2nd   Hearing loss    Hemorrhoids    Hypercholesterolemia    Hypertension    Osteoarthritis 12/19/2006   Qualifier: Diagnosis of  By: Julien Girt CMA, Leigh     Osteoporosis    Past Surgical History:  Procedure Laterality Date   BIOPSY  02/17/2020   Procedure: BIOPSY;  Surgeon: Jackquline Denmark, MD;  Location: [facility];  Service: Endoscopy;;   CATARACT EXTRACTION, BILATERAL  2009   COLONOSCOPY  2009, 2006 , 2017   ESOPHAGOGASTRODUODENOSCOPY (EGD) WITH PROPOFOL N/A 02/17/2020    Procedure: ESOPHAGOGASTRODUODENOSCOPY (EGD) WITH PROPOFOL;  Surgeon: Jackquline Denmark, MD;  Location: [facility];  Service: Endoscopy;  Laterality: N/A;   IR ANGIOGRAM SELECTIVE EACH ADDITIONAL VESSEL  02/17/2020   IR ANGIOGRAM SELECTIVE EACH ADDITIONAL VESSEL  02/17/2020   IR ANGIOGRAM SELECTIVE EACH ADDITIONAL VESSEL  02/17/2020   IR ANGIOGRAM SELECTIVE EACH ADDITIONAL VESSEL  02/17/2020   IR ANGIOGRAM SELECTIVE EACH ADDITIONAL VESSEL  02/17/2020   IR ANGIOGRAM VISCERAL SELECTIVE  02/17/2020   IR ANGIOGRAM VISCERAL SELECTIVE  02/17/2020   IR US GUIDE VASC ACCESS RIGHT  02/17/2020   stapes surgery     Dr Thornell Mule   TONSILLECTOMY AND ADENOIDECTOMY     as a child    Allergies  Allergen Reactions   Penicillins Anaphylaxis   Bisphosphonates Other (See Comments)    [name]   Influenza Vaccines     Unknown   Simvastatin     Unknown    Trazodone And Nefazodone     Felt her throat was closing up/kept her awake   Sulfonamide Derivatives Rash and Other (See Comments)    blisters    Allergies as of 12/21/2021       Reactions   Penicillins Anaphylaxis   Bisphosphonates Other (See Comments)   [name]   Influenza Vaccines    Unknown   Simvastatin    Unknown    Trazodone And Nefazodone    Felt her throat was closing up/kept her awake   Sulfonamide Derivatives Rash, Other (See Comments)   blisters        Medication List        Accurate as of December 21, 2021 11:59 PM. If you have any questions, ask your nurse or doctor.          albuterol 108 (90 Base) MCG/ACT inhaler Commonly known as: VENTOLIN HFA Inhale into the lungs every 6 (six) hours as needed for wheezing or shortness of breath.   budesonide-formoterol 80-4.5 MCG/ACT inhaler Commonly known as: SYMBICORT Inhale 1 puff into the lungs daily.   calcium carbonate 750 MG chewable tablet Commonly known as: TUMS EX Chew 750 mg by mouth daily.   cetirizine 10 MG tablet Commonly known as: ZYRTEC Take 1 tablet (10 mg  total) by mouth daily.   cholecalciferol  1000 units tablet Commonly known as: VITAMIN D Take 1,000 Units by mouth daily.   cyanocobalamin 1000 MCG tablet Commonly known as: VITAMIN B12 Take 1,000 mcg by mouth daily.   cyclobenzaprine 5 MG tablet Commonly known as: FLEXERIL Take 2.5 mg by mouth at bedtime.   docusate sodium 100 MG capsule Commonly known as: COLACE Take 100 mg by mouth 2 (two) times daily.   ferrous sulfate 325 (65 FE) MG tablet Take 325 mg by mouth. Once A Day on Mon, Wed, Fri   guaiFENesin 600 MG 12 hr tablet Commonly known as: Mucinex Take 1 tablet (600 mg total) by mouth 2 (two) times daily.   hydrocortisone cream 1 % Apply 1 application topically 2 (two) times daily as needed (wrists).   hydrocortisone valerate cream 0.2 % Commonly known as: WESTCORT Apply 1 application topically 2 (two) times daily as needed (for itching). Cleanse behind ears with gentle cleanser and dry completely.   ipratropium-albuterol 0.5-2.5 (3) MG/3ML Soln Commonly known as: DUONEB Take 3 mLs by nebulization in the morning.   ipratropium-albuterol 0.5-2.5 (3) MG/3ML Soln Commonly known as: DUONEB Take 3 mLs by nebulization daily as needed.   ketoconazole 2 % cream Commonly known as: NIZORAL Apply 1 application topically 2 (two) times daily as needed for irritation.   metoprolol tartrate 25 MG tablet Commonly known as: LOPRESSOR Take 12.5 mg by mouth 2 (two) times daily.   nitrofurantoin 50 MG capsule Commonly known as: MACRODANTIN Take 50 mg by mouth at bedtime.   Olopatadine HCl 0.2 % Soln Place 1 drop into both eyes daily.   polyethylene glycol 17 g packet Commonly known as: MIRALAX / GLYCOLAX Take 17 g by mouth 2 (two) times a week. Sunday and Wednesday   predniSONE 10 MG tablet Commonly known as: DELTASONE Take 0.5 tablets (5 mg total) by mouth daily with breakfast.   pregabalin 75 MG capsule Commonly known as: LYRICA Take 1 capsule (75 mg total) by  mouth at bedtime.   Propylene Glycol 0.6 % Soln Place 1 drop into both eyes 4 (four) times daily.   traMADol 50 MG tablet Commonly known as: ULTRAM Take 0.5 tablets (25 mg total) by mouth every 8 (eight) hours as needed.        Review of Systems  Constitutional:  Negative for appetite change, fatigue and fever.  HENT:  Positive for hearing loss. Negative for congestion and voice change.   Eyes:  Negative for visual disturbance.       C/o blurred vision, ? Color, ? Right eye peripheral vision loss-f/u Ophthalmology  Respiratory:  Positive for cough and shortness of breath. Negative for chest tightness and wheezing.        DOE  Cardiovascular:  Positive for chest pain and leg swelling.  Gastrointestinal:  Negative for abdominal pain and constipation.  Genitourinary:  Negative for dysuria and urgency.       Chronic, on and off  Musculoskeletal:  Positive for arthralgias, back pain and gait problem. Negative for myalgias.  Skin:  Negative for color change.  Neurological:  Negative for speech difficulty, weakness and light-headedness.  Psychiatric/Behavioral:  Negative for behavioral problems, hallucinations and sleep disturbance.        Chronic going to bed late, sleeping in late. Lethargic upon my examination.     Immunization History  Administered Date(s) Administered   Medtronic Booster Vaccination 12/25/2019   Moderna Sars-Covid-2 Vaccination 02/17/2019, 03/17/2019   Pneumococcal Conjugate-13 09/01/2015   Pneumococcal Polysaccharide-23 07/07/1998   Td  08/21/2009   Pertinent  Health Maintenance Due  Topic Date Due   DEXA SCAN  Completed   INFLUENZA VACCINE  Discontinued      03/18/2021   10:52 AM 03/19/2021    3:00 AM 03/19/2021    8:00 AM 03/20/2021   12:00 AM 03/20/2021   12:00 PM  Fall Risk  Patient Fall Risk Level High fall risk High fall risk High fall risk High fall risk High fall risk   Functional Status Survey:    Vitals:   12/21/21 1022  BP: (!) 147/75   Pulse: (!) 59  Resp: 20  Temp: (!) 96.9 F (36.1 C)  SpO2: 98%  Weight: 146 lb 3.2 oz (66.3 kg)   Body mass index is 27.62 kg/m. Physical Exam Vitals and nursing note reviewed.  Constitutional:      Appearance: Normal appearance.  HENT:     Head: Normocephalic and atraumatic.     Nose: Nose normal.     Mouth/Throat:     Mouth: Mucous membranes are moist.  Eyes:     Extraocular Movements: Extraocular movements intact.     Conjunctiva/sclera: Conjunctivae normal.     Pupils: Pupils are equal, round, and reactive to light.     Comments: Able to count my fingers 3 feet away from her eyes.   Cardiovascular:     Rate and Rhythm: Normal rate and regular rhythm.     Heart sounds: No murmur heard. Pulmonary:     Breath sounds: Wheezing present. No rales.     Comments: Decreased air entry to both lungs. Diffused expiratory wheezes R+L lung. Pain palpated left chest wall under the left breast, no associated with deep breathing or movement.  Chest:     Chest wall: Tenderness present.  Abdominal:     General: Bowel sounds are normal.     Palpations: Abdomen is soft.     Tenderness: There is no abdominal tenderness.  Musculoskeletal:     Cervical back: Normal range of motion and neck supple.     Right lower leg: Edema present.     Left lower leg: Edema present.     Comments: Trace edema BLE  Skin:    General: Skin is warm and dry.  Neurological:     General: No focal deficit present.     Mental Status: She is alert. Mental status is at baseline.     Motor: No weakness.     Coordination: Coordination normal.     Gait: Gait abnormal.     Comments: Oriented to person, place.   Psychiatric:        Mood and Affect: Mood normal.        Behavior: Behavior normal.     Labs reviewed: Recent Labs    03/18/21 1039 03/20/21 0456 03/24/21 0000 03/26/21 0000 04/09/21 0000 06/12/21 0000  NA 136 135   < > 140 135* 137  K 3.9 3.2*   < > 4.0 4.3 4.4  CL 101 106   < > 107 103 104   CO2 26 22   < > 27* 25* 25*  GLUCOSE 143* 96  --   --   --   --   BUN 18 20   < > '13 13 16  '$ CREATININE 0.99 0.70   < > 0.8 1.0 1.2*  CALCIUM 9.7 8.1*   < > 8.9 8.7 8.8   < > = values in this interval not displayed.   Recent Labs    03/18/21 1039  04/09/21 0000 06/12/21 0000  AST '22 13 16  '$ ALT '15 8 10  '$ ALKPHOS 87 80 82  BILITOT 0.9  --   --   PROT 7.7  --   --   ALBUMIN 4.5 3.8 4.0   Recent Labs    03/18/21 1039 03/20/21 0456 03/26/21 0000 04/09/21 0000 06/12/21 0000  WBC 7.2 7.8 5.5 5.2 3.8  NEUTROABS 6.2 6.0 3,641.00 4,113.00 2,466.00  HGB 13.7 11.1* 10.7* 11.5* 11.8*  HCT 41.3 34.1* 32* 35* 36  MCV 92.2 93.7  --   --   --   PLT 204 145* 207 198 180   Lab Results  Component Value Date   TSH 0.661 03/19/2021   Lab Results  Component Value Date   HGBA1C 6.0 (H) 03/19/2021   Lab Results  Component Value Date   CHOL 192 03/19/2021   HDL 69 03/19/2021   LDLCALC 107 (H) 03/19/2021   LDLDIRECT 114.7 08/18/2011   TRIG 78 03/19/2021   CHOLHDL 2.8 03/19/2021    Significant Diagnostic Results in last 30 days:  No results found.  Assessment/Plan: Chest wall pain CXR, X-ray left ribs, pain palpated under the left breast, no associated with deep breathing or movement. The patient stated she had leaned over chair to pick up earring and heard a crack 4 days ago will schedule Tramadol '25mg'$  tid x 1 week in addition to prn. Observe.  12/21/21 CXR, L ribs, no acute findings.   COPD (chronic obstructive pulmonary disease) with chronic bronchitis diffused mild expiratory wheezes, c/o SOB, uses O2 more than prior,  takes Symbicort, Zyrtec, Mucinex, DuoNeb, prn Albuterol, cough is chronic, try Prednisone low dose @ '5mg'$  qd. CXR 11/20/21 no acute pulmonary disease, repeat CXR in setting of c/o anterior left chest wall pain.   CVA (cerebral vascular accident) [facility])  CT x2 03/2021 showed no acute findings except age indeterminate right corona radiator infarct, small vessel changes.  Neurology recommended no further work ups.  Macular degeneration, bilateral Macular degeneration, f/u [facility]: OU for [place] MD involving tcentral area. OS exudative age related MD  Osteoarthritis  Chronic lower back, leg pain, on Flexeril qhs, Lyrica   HTN (hypertension) blood pressure is controlled on Metoprolol. Bun/creat 16/1.2 06/12/21  Atrophic vaginitis on  Nitrofurantoin '50mg'$  qd for UTI suppression, Dr. Karsten Ro. Estrace vaginal cream for atrophic vaginitis.   Chronic anemia Hx of Hgb 6.8 in hospital s/p 2 units of PRBC,  Vit B12, Iron, Hgb 11.8 06/12/21  GERD GERD/chronic gastritis/atrophic gastritis, GI bleed 02/2020, s/p EGD, per biopsy, takes Protonix. F/u GI prn  Fibromyalgia  Stable, takes Lyrica  Tactile hallucination No mentioned by the patient, off meds. TSH 0.661 03/19/21  Slow transit constipation Stable, takes MiraLax 2x/wk. Colace daily.   Prediabetes Hgb a1c 6.0 03/19/21    Family/ staff Communication: plan of care reviewed with the patient and charge nurse.   Labs/tests ordered:  CXR ap/lateral, X-ray left rib  Time spend 40 minutes.

## 2021-12-21 NOTE — Assessment & Plan Note (Signed)
blood pressure is controlled on Metoprolol. Bun/creat 16/1.2 06/12/21

## 2021-12-21 NOTE — Assessment & Plan Note (Signed)
Macular degeneration, f/u [facility]: OU for [place] MD involving tcentral area. OS exudative age related MD

## 2021-12-22 ENCOUNTER — Encounter: Payer: Self-pay | Admitting: Nurse Practitioner

## 2021-12-29 ENCOUNTER — Non-Acute Institutional Stay: Payer: Medicare Other | Admitting: Family Medicine

## 2021-12-29 DIAGNOSIS — I10 Essential (primary) hypertension: Secondary | ICD-10-CM | POA: Diagnosis not present

## 2021-12-29 DIAGNOSIS — R0789 Other chest pain: Secondary | ICD-10-CM | POA: Diagnosis not present

## 2021-12-29 DIAGNOSIS — M159 Polyosteoarthritis, unspecified: Secondary | ICD-10-CM

## 2021-12-29 DIAGNOSIS — J4489 Other specified chronic obstructive pulmonary disease: Secondary | ICD-10-CM

## 2021-12-29 DIAGNOSIS — R7303 Prediabetes: Secondary | ICD-10-CM

## 2021-12-29 DIAGNOSIS — I63411 Cerebral infarction due to embolism of right middle cerebral artery: Secondary | ICD-10-CM | POA: Diagnosis not present

## 2021-12-29 DIAGNOSIS — D649 Anemia, unspecified: Secondary | ICD-10-CM

## 2021-12-29 NOTE — Progress Notes (Signed)
Provider:  Jacalyn Lefevre, MD Location:      Place of Service:     PCP: Mast, Man X, NP Patient Care Team: Mast, Man X, NP as PCP - General (Internal Medicine) Mast, Man X, NP as Nurse Practitioner (Internal Medicine)  Extended Emergency Contact Information Primary Emergency Contact: Menor,Jeff Home Phone: [phone] Relation: Son Secondary Emergency Contact: Culbreth Sr.,Christopher Home Phone: [phone] Relation: Relative  Code Status:  Goals of Care: Advanced Directive information    06/18/2021    3:41 PM  Advanced Directives  Does Patient Have a Medical Advance Directive? Yes  Type of Advance Directive Out of facility DNR (pink MOST or yellow form);Living will  Does patient want to make changes to medical advance directive? No - Patient declined      No chief complaint on file.   HPI: Patient is a 88 y.o. female seen today for medical management of chronic problems including anemia, anxiety, asthma, COPD, hypertension, and osteoarthritis.  I found her in the activity area of assisted living.  She was just preparing to go outside for a little with a friend.  She uses a walker for ambulation.  Her only complaint today was some persistent pain in her left mid chest.  She apparently injured the area leaning over a chair.  By her history x-rays were negative.  She was given some prednisone and she thinks that was for her rib but actually was for her wheezing and asthma.  She uses Symbicort and DuoNebs with as needed albuterol for her COPD.  She has chronic cough.  Packed red cells most recent hemoglobin and April of this year was 11.8 She has history of CVA but CT x2 showed no acute findings History of hypertension blood pressure controlled on metoprolol.  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     Fibromyalgia    GERD 12/19/2006   Qualifier: Diagnosis of  By: Renaldo Fiddler CMA, Marliss Czar  04/13/16 TSH 1.20, Na 141, K 4.2, Bun 15, creat 0.79, wbc 5.5, Hgb 11.5, plt 283    GERD (gastroesophageal reflux disease)    Hammer toe of second toe of left foot 04/08/2016   Left 2nd   Hearing loss    Hemorrhoids    Hypercholesterolemia    Hypertension    Osteoarthritis 12/19/2006   Qualifier: Diagnosis of  By: Renaldo Fiddler CMA, Leigh     Osteoporosis    Past Surgical History:  Procedure Laterality Date   BIOPSY  02/17/2020   Procedure: BIOPSY;  Surgeon: Lynann Bologna, MD;  Location: [facility];  Service: Endoscopy;;   CATARACT EXTRACTION, BILATERAL  2009   COLONOSCOPY  2009,  2006 , 2017   ESOPHAGOGASTRODUODENOSCOPY (EGD) WITH PROPOFOL N/A 02/17/2020   Procedure: ESOPHAGOGASTRODUODENOSCOPY (EGD) WITH PROPOFOL;  Surgeon: Lynann Bologna, MD;  Location: [facility];  Service: Endoscopy;  Laterality: N/A;   IR ANGIOGRAM SELECTIVE EACH ADDITIONAL VESSEL  02/17/2020   IR ANGIOGRAM SELECTIVE EACH ADDITIONAL VESSEL  02/17/2020   IR ANGIOGRAM SELECTIVE EACH ADDITIONAL VESSEL  02/17/2020   IR ANGIOGRAM SELECTIVE EACH ADDITIONAL VESSEL  02/17/2020   IR ANGIOGRAM SELECTIVE EACH ADDITIONAL VESSEL  02/17/2020   IR ANGIOGRAM VISCERAL SELECTIVE  02/17/2020   IR ANGIOGRAM VISCERAL SELECTIVE  02/17/2020   IR US GUIDE VASC ACCESS RIGHT  02/17/2020   stapes surgery     Dr Dorma Russell   TONSILLECTOMY AND  ADENOIDECTOMY     as a child    reports that she quit smoking about 11 years ago. Her smoking use included cigarettes. She has never used smokeless tobacco. She reports current alcohol use. She reports that she does not use drugs. Social History   Socioeconomic History   Marital status: Widowed    Spouse name: Not on file   Number of children: Not on file   Years of education: Not on file   Highest education level: Not on file  Occupational History   Occupation: retired   Occupation: works some weekends at [employer]  Tobacco Use   Smoking status: Former    Types: Cigarettes    Quit date: 02/15/2010    Years since quitting: 11.8   Smokeless tobacco: Never  Vaping Use   Vaping Use: Never used  Substance and Sexual Activity   Alcohol use: Yes    Comment: occasional glass of wine   Drug use: No   Sexual activity: Never  Other Topics Concern   Not on file  Social History Narrative   Pt works some weekends at [employer]   Caffeine use - daily coffee in the mornings   Patient gets regular exercise > tries to walk daily for exercise   Moved to [facility] [place] 03/11/16   Widowed   Former Smoker-stopped 2012   Alcohol occasionally glass of wine   Living Will   Social Determinants of Health   Financial Resource Strain: Low Risk  (11/08/2017)   Overall Financial Resource Strain (CARDIA)    Difficulty of Paying Living Expenses: Not hard at all  Food Insecurity: No Food Insecurity (11/08/2017)   Hunger Vital Sign    Worried About Running Out of Food in the Last Year: Never true    Ran Out of Food in the Last Year: Never true  Transportation Needs: No Transportation Needs (11/08/2017)   PRAPARE - Administrator, Civil Service (Medical): No    Lack of Transportation (Non-Medical): No  Physical Activity: Sufficiently Active (11/08/2017)   Exercise Vital Sign    Days of Exercise per Week: 5 days    Minutes of Exercise per Session: 30 min  Stress: No  Stress Concern Present (11/08/2017)   [employer] of Occupational Health - Occupational Stress Questionnaire    Feeling of Stress : Not at all  Social Connections: Moderately Isolated (11/08/2017)   Social Connection and Isolation Panel [NHANES]    Frequency of Communication with Friends and Family: More than three times a week    Frequency of Social Gatherings with Friends and Family: Twice a week    Attends Religious Services: Never    Database administrator or Organizations: No    Attends Banker Meetings:  Never    Marital Status: Widowed  Intimate Partner Violence: Not At Risk (11/08/2017)   Humiliation, Afraid, Rape, and Kick questionnaire    Fear of Current or Ex-Partner: No    Emotionally Abused: No    Physically Abused: No    Sexually Abused: No    Functional Status Survey:    Family History  Problem Relation Age of Onset   Heart disease Father    COPD Father    Hypertension Mother    Arthritis Mother     Health Maintenance  Topic Date Due   Zoster Vaccines- Shingrix (1 of 2) Never done   Pneumonia Vaccine 77+ Years old (3 - PPSV23 or PCV20) 08/31/2016   TETANUS/TDAP  08/22/2019   COVID-19 Vaccine (3 - Moderna series) 02/19/2020   Medicare Annual Wellness (AWV)  02/06/2022   DEXA SCAN  Completed   HPV VACCINES  Aged Out   INFLUENZA VACCINE  Discontinued    Allergies  Allergen Reactions   Penicillins Anaphylaxis   Bisphosphonates Other (See Comments)    [name]   Influenza Vaccines     Unknown   Simvastatin     Unknown    Trazodone And Nefazodone     Felt her throat was closing up/kept her awake   Sulfonamide Derivatives Rash and Other (See Comments)    blisters    Outpatient Encounter Medications as of 12/29/2021  Medication Sig   albuterol (VENTOLIN HFA) 108 (90 Base) MCG/ACT inhaler Inhale into the lungs every 6 (six) hours as needed for wheezing or shortness of breath.   budesonide-formoterol (SYMBICORT) 80-4.5 MCG/ACT  inhaler Inhale 1 puff into the lungs daily.   calcium carbonate (TUMS EX) 750 MG chewable tablet Chew 750 mg by mouth daily.    cetirizine (ZYRTEC) 10 MG tablet Take 1 tablet (10 mg total) by mouth daily.   cholecalciferol (VITAMIN D) 1000 UNITS tablet Take 1,000 Units by mouth daily.   cyclobenzaprine (FLEXERIL) 5 MG tablet Take 2.5 mg by mouth at bedtime.   docusate sodium (COLACE) 100 MG capsule Take 100 mg by mouth 2 (two) times daily.   ferrous sulfate 325 (65 FE) MG tablet Take 325 mg by mouth. Once A Day on Mon, Wed, Fri   guaiFENesin (MUCINEX) 600 MG 12 hr tablet Take 1 tablet (600 mg total) by mouth 2 (two) times daily.   hydrocortisone cream 1 % Apply 1 application topically 2 (two) times daily as needed (wrists).   hydrocortisone valerate cream (WESTCORT) 0.2 % Apply 1 application topically 2 (two) times daily as needed (for itching). Cleanse behind ears with gentle cleanser and dry completely.   ipratropium-albuterol (DUONEB) 0.5-2.5 (3) MG/3ML SOLN Take 3 mLs by nebulization in the morning.   ipratropium-albuterol (DUONEB) 0.5-2.5 (3) MG/3ML SOLN Take 3 mLs by nebulization daily as needed.   ketoconazole (NIZORAL) 2 % cream Apply 1 application topically 2 (two) times daily as needed for irritation.   metoprolol tartrate (LOPRESSOR) 25 MG tablet Take 12.5 mg by mouth 2 (two) times daily.   nitrofurantoin (MACRODANTIN) 50 MG capsule Take 50 mg by mouth at bedtime.   Olopatadine HCl 0.2 % SOLN Place 1 drop into both eyes daily.   polyethylene glycol (MIRALAX / GLYCOLAX) 17 g packet Take 17 g by mouth 2 (two) times a week. Sunday and Wednesday   predniSONE (DELTASONE) 10 MG tablet Take 0.5 tablets (5 mg total) by mouth daily with breakfast.   pregabalin (LYRICA) 75 MG capsule Take 1 capsule (75 mg  total) by mouth at bedtime.   Propylene Glycol 0.6 % SOLN Place 1 drop into both eyes 4 (four) times daily.   traMADol (ULTRAM) 50 MG tablet Take 0.5 tablets (25 mg total) by mouth every 8  (eight) hours as needed.   vitamin B-12 (CYANOCOBALAMIN) 1000 MCG tablet Take 1,000 mcg by mouth daily.   No facility-administered encounter medications on file as of 12/29/2021.    Review of Systems  Constitutional: Negative.   HENT: Negative.    Respiratory:  Positive for cough.   Cardiovascular:  Positive for chest pain.  Gastrointestinal: Negative.   Musculoskeletal:  Positive for back pain.  Neurological: Negative.   Psychiatric/Behavioral:  Positive for confusion.     There were no vitals filed for this visit. There is no height or weight on file to calculate BMI. Physical Exam Vitals and nursing note reviewed.  Constitutional:      Appearance: Normal appearance.  HENT:     Mouth/Throat:     Mouth: Mucous membranes are dry.  Eyes:     Extraocular Movements: Extraocular movements intact.     Pupils: Pupils are equal, round, and reactive to light.  Cardiovascular:     Rate and Rhythm: Normal rate and regular rhythm.  Pulmonary:     Effort: Pulmonary effort is normal.     Breath sounds: Normal breath sounds.     Comments: Tenderness is on left lateral chest secondary to leaning over a chair Chest:     Chest wall: Tenderness present.  Neurological:     General: No focal deficit present.     Mental Status: She is alert and oriented to person, place, and time.  Psychiatric:        Mood and Affect: Mood normal.        Behavior: Behavior normal.     Labs reviewed: Basic Metabolic Panel: Recent Labs    03/18/21 1039 03/20/21 0456 03/24/21 0000 03/26/21 0000 04/09/21 0000 06/12/21 0000  NA 136 135   < > 140 135* 137  K 3.9 3.2*   < > 4.0 4.3 4.4  CL 101 106   < > 107 103 104  CO2 26 22   < > 27* 25* 25*  GLUCOSE 143* 96  --   --   --   --   BUN 18 20   < > 13 13 16   CREATININE 0.99 0.70   < > 0.8 1.0 1.2*  CALCIUM 9.7 8.1*   < > 8.9 8.7 8.8   < > = values in this interval not displayed.   Liver Function Tests: Recent Labs    03/18/21 1039  04/09/21 0000 06/12/21 0000  AST 22 13 16   ALT 15 8 10   ALKPHOS 87 80 82  BILITOT 0.9  --   --   PROT 7.7  --   --   ALBUMIN 4.5 3.8 4.0   No results for input(s): "LIPASE", "AMYLASE" in the last 8760 hours. Recent Labs    03/18/21 1856  AMMONIA 30   CBC: Recent Labs    03/18/21 1039 03/20/21 0456 03/26/21 0000 04/09/21 0000 06/12/21 0000  WBC 7.2 7.8 5.5 5.2 3.8  NEUTROABS 6.2 6.0 3,641.00 4,113.00 2,466.00  HGB 13.7 11.1* 10.7* 11.5* 11.8*  HCT 41.3 34.1* 32* 35* 36  MCV 92.2 93.7  --   --   --   PLT 204 145* 207 198 180   Cardiac Enzymes: No results for input(s): "CKTOTAL", "CKMB", "CKMBINDEX", "TROPONINI" in the last 8760  hours. BNP: Invalid input(s): "POCBNP" Lab Results  Component Value Date   HGBA1C 6.0 (H) 03/19/2021   Lab Results  Component Value Date   TSH 0.661 03/19/2021   Lab Results  Component Value Date   VITAMINB12 1,441 (H) 03/19/2021   No results found for: "FOLATE" Lab Results  Component Value Date   IRON 59 01/10/2019   TIBC 281 01/10/2019   FERRITIN 46 01/10/2019    Imaging and Procedures obtained prior to SNF admission: CT ANGIO HEAD W OR WO CONTRAST  Result Date: 03/19/2021 CLINICAL DATA:  Stroke suspected, transient aphasia EXAM: CT ANGIOGRAPHY HEAD AND NECK TECHNIQUE: Multidetector CT imaging of the head and neck was performed using the standard protocol during bolus administration of intravenous contrast. Multiplanar CT image reconstructions and MIPs were obtained to evaluate the vascular anatomy. Carotid stenosis measurements (when applicable) are obtained utilizing NASCET criteria, using the distal internal carotid diameter as the denominator. RADIATION DOSE REDUCTION: This exam was performed according to the departmental dose-optimization program which includes automated exposure control, adjustment of the mA and/or kV according to patient size and/or use of iterative reconstruction technique. CONTRAST:  75mL OMNIPAQUE IOHEXOL 350  MG/ML SOLN COMPARISON:  No prior CTA, correlation is made with CT head 03/19/2021 FINDINGS: CT HEAD FINDINGS For noncontrast findings, please see same day CT head. CTA NECK FINDINGS Aortic arch: Two-vessel arch with a common origin of the brachiocephalic and left common carotid arteries. Imaged portion shows no evidence of aneurysm or dissection. No significant stenosis of the major arch vessel origins. Aortic atherosclerosis. Right carotid system: No evidence of dissection, stenosis (50% or greater) or occlusion. Calcifications at the bifurcation and in the proximal right ICA are not hemodynamically significant. Retropharyngeal course of the right common carotid artery. Left carotid system: No evidence of dissection, stenosis (50% or greater) or occlusion. Calcifications at the bifurcation and in the proximal left ICA are not hemodynamically significant. Vertebral arteries: Severe focal narrowing in the right V1 segment (series 7, image 240), possibly secondary to noncalcified plaque. The vertebral arteries are otherwise patent, without additional stenosis, dissection, or occlusion. Skeleton: Degenerative changes in the cervical spine, with reversal of the normal cervical lordosis and anterolisthesis of C2 on C3 and C3 on C4, which appears degenerative. No acute osseous abnormality. Other neck: Negative. Upper chest: Severe emphysema. No focal pulmonary opacity or pleural effusion. Apical pleural-parenchymal scarring. Review of the MIP images confirms the above findings CTA HEAD FINDINGS Anterior circulation: Both internal carotid arteries are patent to the termini, with mild calcifications but without significant stenosis. A1 segments patent, with mild long segment narrowing of the proximal and mid right A1. Normal anterior communicating artery. Anterior cerebral arteries are patent to their distal aspects. No M1 stenosis or occlusion. Normal MCA bifurcations. Distal MCA branches are mildly irregular but perfused  and symmetric. Posterior circulation: Vertebral arteries patent to the vertebrobasilar junction without stenosis. Posterior inferior cerebral arteries patent bilaterally. Basilar patent to its distal aspect. Superior cerebellar arteries patent bilaterally. Patent P1 segments. PCAs perfused to their distal aspects without stenosis. The right posterior communicating artery is patent. The left posterior communicating artery is not visualized. Venous sinuses: As permitted by contrast timing, patent. Anatomic variants: None significant Review of the MIP images confirms the above findings IMPRESSION: 1.  No intracranial large vessel occlusion or significant stenosis. 2. Severe focal narrowing in the right V1 segment, with otherwise patent right vertebral artery. No other hemodynamically significant stenosis in the neck. Electronically Signed   By: Jill Side  Vasan M.D.   On: 03/19/2021 23:23   CT ANGIO NECK W OR WO CONTRAST  Result Date: 03/19/2021 CLINICAL DATA:  Stroke suspected, transient aphasia EXAM: CT ANGIOGRAPHY HEAD AND NECK TECHNIQUE: Multidetector CT imaging of the head and neck was performed using the standard protocol during bolus administration of intravenous contrast. Multiplanar CT image reconstructions and MIPs were obtained to evaluate the vascular anatomy. Carotid stenosis measurements (when applicable) are obtained utilizing NASCET criteria, using the distal internal carotid diameter as the denominator. RADIATION DOSE REDUCTION: This exam was performed according to the departmental dose-optimization program which includes automated exposure control, adjustment of the mA and/or kV according to patient size and/or use of iterative reconstruction technique. CONTRAST:  75mL OMNIPAQUE IOHEXOL 350 MG/ML SOLN COMPARISON:  No prior CTA, correlation is made with CT head 03/19/2021 FINDINGS: CT HEAD FINDINGS For noncontrast findings, please see same day CT head. CTA NECK FINDINGS Aortic arch: Two-vessel arch  with a common origin of the brachiocephalic and left common carotid arteries. Imaged portion shows no evidence of aneurysm or dissection. No significant stenosis of the major arch vessel origins. Aortic atherosclerosis. Right carotid system: No evidence of dissection, stenosis (50% or greater) or occlusion. Calcifications at the bifurcation and in the proximal right ICA are not hemodynamically significant. Retropharyngeal course of the right common carotid artery. Left carotid system: No evidence of dissection, stenosis (50% or greater) or occlusion. Calcifications at the bifurcation and in the proximal left ICA are not hemodynamically significant. Vertebral arteries: Severe focal narrowing in the right V1 segment (series 7, image 240), possibly secondary to noncalcified plaque. The vertebral arteries are otherwise patent, without additional stenosis, dissection, or occlusion. Skeleton: Degenerative changes in the cervical spine, with reversal of the normal cervical lordosis and anterolisthesis of C2 on C3 and C3 on C4, which appears degenerative. No acute osseous abnormality. Other neck: Negative. Upper chest: Severe emphysema. No focal pulmonary opacity or pleural effusion. Apical pleural-parenchymal scarring. Review of the MIP images confirms the above findings CTA HEAD FINDINGS Anterior circulation: Both internal carotid arteries are patent to the termini, with mild calcifications but without significant stenosis. A1 segments patent, with mild long segment narrowing of the proximal and mid right A1. Normal anterior communicating artery. Anterior cerebral arteries are patent to their distal aspects. No M1 stenosis or occlusion. Normal MCA bifurcations. Distal MCA branches are mildly irregular but perfused and symmetric. Posterior circulation: Vertebral arteries patent to the vertebrobasilar junction without stenosis. Posterior inferior cerebral arteries patent bilaterally. Basilar patent to its distal aspect.  Superior cerebellar arteries patent bilaterally. Patent P1 segments. PCAs perfused to their distal aspects without stenosis. The right posterior communicating artery is patent. The left posterior communicating artery is not visualized. Venous sinuses: As permitted by contrast timing, patent. Anatomic variants: None significant Review of the MIP images confirms the above findings IMPRESSION: 1.  No intracranial large vessel occlusion or significant stenosis. 2. Severe focal narrowing in the right V1 segment, with otherwise patent right vertebral artery. No other hemodynamically significant stenosis in the neck. Electronically Signed   By: Wiliam Ke M.D.   On: 03/19/2021 23:23   ECHOCARDIOGRAM COMPLETE  Result Date: 03/19/2021    ECHOCARDIOGRAM REPORT   Patient Name:   MAYAR ARGUELLO Date of Exam: 03/19/2021 Medical Rec #:  [id number]     Height:       61.0 in Accession #:    [id number]    Weight:       146.8 lb Date of Birth:  12-Jan-1932     BSA:          1.656 m Patient Age:    89 years      BP:           94/52 mmHg Patient Gender: F             HR:           86 bpm. Exam Location:  Inpatient Procedure: 2D Echo, Cardiac Doppler and Color Doppler Indications:    STROKE  History:        Patient has no prior history of Echocardiogram examinations.                 COPD; Risk Factors:Hypertension and Dyslipidemia.  Sonographer:    Festus Barren Referring Phys: [id number] TYRONE A KYLE IMPRESSIONS  1. Narrow LVOT with turbulent flow. No significant obstruction at rest. . Left ventricular ejection fraction, by estimation, is >75%. The left ventricle has hyperdynamic function. The left ventricle has no regional wall motion abnormalities. There is mild concentric left ventricular hypertrophy. Left ventricular diastolic parameters are consistent with Grade I diastolic dysfunction (impaired relaxation). Elevated left atrial pressure.  2. Right ventricular systolic function is normal. The right ventricular size is normal.  3.  Left atrial size was moderately dilated.  4. Trivial mitral valve regurgitation. Severe mitral annular calcification.  5. The aortic valve is tricuspid. Aortic valve regurgitation is not visualized. Aortic valve sclerosis/calcification is present, without any evidence of aortic stenosis.  6. The inferior vena cava is normal in size with greater than 50% respiratory variability, suggesting right atrial pressure of 3 mmHg. FINDINGS  Left Ventricle: Narrow LVOT with turbulent flow. No significant obstruction at rest. Left ventricular ejection fraction, by estimation, is >75%. The left ventricle has hyperdynamic function. The left ventricle has no regional wall motion abnormalities. The left ventricular internal cavity size was normal in size. There is mild concentric left ventricular hypertrophy. Left ventricular diastolic parameters are consistent with Grade I diastolic dysfunction (impaired relaxation). Elevated left atrial pressure. Right Ventricle: The right ventricular size is normal. Right vetricular wall thickness was not assessed. Right ventricular systolic function is normal. Left Atrium: Left atrial size was moderately dilated. Right Atrium: Right atrial size was normal in size. Pericardium: There is no evidence of pericardial effusion. Mitral Valve: There is mild thickening of the mitral valve leaflet(s). Severe mitral annular calcification. Trivial mitral valve regurgitation. MV peak gradient, 113.2 mmHg. The mean mitral valve gradient is 72.0 mmHg. Tricuspid Valve: The tricuspid valve is normal in structure. Tricuspid valve regurgitation is trivial. Aortic Valve: The aortic valve is tricuspid. Aortic valve regurgitation is not visualized. Aortic valve sclerosis/calcification is present, without any evidence of aortic stenosis. Aortic valve mean gradient measures 8.0 mmHg. Aortic valve peak gradient measures 13.4 mmHg. Aortic valve area, by VTI measures 2.26 cm. Pulmonic Valve: The pulmonic valve was not  well visualized. Aorta: The aortic root is normal in size and structure. Venous: The inferior vena cava is normal in size with greater than 50% respiratory variability, suggesting right atrial pressure of 3 mmHg. IAS/Shunts: No atrial level shunt detected by color flow Doppler.  LEFT VENTRICLE PLAX 2D LVIDd:         4.39 cm     Diastology LVIDs:         3.00 cm     LV e' medial:    4.79 cm/s LV PW:         1.06 cm  LV E/e' medial:  25.9 LV IVS:        1.27 cm     LV e' lateral:   2.94 cm/s LVOT diam:     1.70 cm     LV E/e' lateral: 42.2 LV SV:         77 LV SV Index:   47 LVOT Area:     2.27 cm  LV Volumes (MOD) LV vol d, MOD A2C: 21.1 ml LV vol d, MOD A4C: 18.0 ml LV vol s, MOD A2C: 4.5 ml LV vol s, MOD A4C: 1.9 ml LV SV MOD A2C:     16.6 ml LV SV MOD A4C:     18.0 ml LV SV MOD BP:      16.8 ml RIGHT VENTRICLE             IVC RV S prime:     13.50 cm/s  IVC diam: 1.40 cm TAPSE (M-mode): 1.9 cm LEFT ATRIUM             Index        RIGHT ATRIUM          Index LA diam:        3.20 cm 1.93 cm/m   RA Area:     9.94 cm LA Vol (A2C):   60.3 ml 36.41 ml/m  RA Volume:   19.40 ml 11.71 ml/m LA Vol (A4C):   62.6 ml 37.80 ml/m LA Biplane Vol: 65.2 ml 39.37 ml/m  AORTIC VALVE AV Area (Vmax):    2.16 cm AV Area (Vmean):   1.91 cm AV Area (VTI):     2.26 cm AV Vmax:           183.00 cm/s AV Vmean:          133.000 cm/s AV VTI:            0.341 m AV Peak Grad:      13.4 mmHg AV Mean Grad:      8.0 mmHg LVOT Vmax:         174.00 cm/s LVOT Vmean:        112.000 cm/s LVOT VTI:          0.340 m LVOT/AV VTI ratio: 1.00  AORTA Ao Root diam: 2.80 cm MITRAL VALVE MV Area (PHT): 2.33 cm     SHUNTS MV Area VTI:   0.49 cm     Systemic VTI:  0.34 m MV Peak grad:  113.2 mmHg   Systemic Diam: 1.70 cm MV Mean grad:  72.0 mmHg MV Vmax:       5.32 m/s MV Vmean:      398.0 cm/s MV Decel Time: 325 msec MV E velocity: 124.00 cm/s MV A velocity: 172.00 cm/s MV E/A ratio:  0.72 Dietrich Pates MD Electronically signed by Dietrich Pates MD  Signature Date/Time: 03/19/2021/1:47:59 PM    Final    CT HEAD WO CONTRAST  Result Date: 03/19/2021 CLINICAL DATA:  Follow-up stroke EXAM: CT HEAD WITHOUT CONTRAST TECHNIQUE: Contiguous axial images were obtained from the base of the skull through the vertex without intravenous contrast. RADIATION DOSE REDUCTION: This exam was performed according to the departmental dose-optimization program which includes automated exposure control, adjustment of the mA and/or kV according to patient size and/or use of iterative reconstruction technique. COMPARISON:  None. FINDINGS: Brain: No evidence of acute infarction, hemorrhage, hydrocephalus, extra-axial collection or mass lesion/mass effect. Unchanged extensive periventricular and deep white matter hypodensity with mild global cerebral volume loss. Focal  hypodensity of the right corona radiata is unchanged, consistent with a small, strictly age indeterminate, although likely chronic lacunar infarction (series 2, image 18). Vascular: No hyperdense vessel or unexpected calcification. Skull: Normal. Negative for fracture or focal lesion. Sinuses/Orbits: No acute finding. Other: None. IMPRESSION: 1. No definite acute intracranial pathology. Focal hypodensity of the right corona radiata is unchanged, consistent with a small, strictly age indeterminate, although likely chronic lacunar infarction. 2. Unchanged extensive periventricular and deep white matter hypodensity with mild global cerebral volume loss, in keeping with advanced small vessel white matter disease. Electronically Signed   By: Jearld Lesch M.D.   On: 03/19/2021 10:42   EEG adult  Result Date: 03/19/2021 Charlsie Quest, MD     03/19/2021  8:36 AM Patient Name: CORDELIA CHEATAM MRN: [id number] Epilepsy Attending: Charlsie Quest Referring Physician/Provider: Teddy Spike, DO Date:03/18/2021 Duration: 22.21 mins Patient history: 88 year old female with altered mental status.  EEG to evaluate for seizure Level of  alertness: Awake AEDs during EEG study: None Technical aspects: This EEG study was done with scalp electrodes positioned according to the 10-20 International system of electrode placement. Electrical activity was acquired at a sampling rate of 500Hz  and reviewed with a high frequency filter of 70Hz  and a low frequency filter of 1Hz . EEG data were recorded continuously and digitally stored. Description: EEG showed continuous generalized 3 to 6 Hz theta-delta slowing. Hyperventilation and photic stimulation were not performed.   ABNORMALITY - Continuous slow, generalized IMPRESSION: This study is suggestive of moderate diffuse encephalopathy, nonspecific etiology. No seizures or epileptiform discharges were seen throughout the recording. Charlsie Quest   CT Head Wo Contrast  Result Date: 03/18/2021 CLINICAL DATA:  Mental status change, unknown cause. Additional history provided: EXAM: CT HEAD WITHOUT CONTRAST TECHNIQUE: Contiguous axial images were obtained from the base of the skull through the vertex without intravenous contrast. RADIATION DOSE REDUCTION: This exam was performed according to the departmental dose-optimization program which includes automated exposure control, adjustment of the mA and/or kV according to patient size and/or use of iterative reconstruction technique. COMPARISON:  No pertinent prior exams available for comparison. FINDINGS: Brain: Mild-to-moderate generalized cerebral atrophy. Small age-indeterminate infarct within the right corona radiata. Background moderate patchy and ill-defined hypoattenuation within the cerebral white matter, nonspecific but compatible with chronic small vessel ischemic disease. Chronic lacunar infarcts within the left basal ganglia and left thalamus. There is no acute intracranial hemorrhage. No demarcated cortical infarct. No extra-axial fluid collection. No evidence of an intracranial mass. No midline shift. Vascular: No hyperdense vessel. Atherosclerotic  calcifications. Skull: Normal. Negative for fracture or focal lesion. Sinuses/Orbits: Visualized orbits show no acute finding. Trace mucosal thickening within the bilateral ethmoid sinuses. IMPRESSION: A small infarct within the right corona radiata is age-indeterminate (although favored subacute or chronic). A brain MRI may be obtained for further evaluation, as clinically warranted. Background moderate chronic small vessel ischemic changes within the cerebral white matter. Chronic lacunar infarcts within the left basal ganglia and left thalamus. Mild-to-moderate generalized cerebral atrophy. Electronically Signed   By: Jackey Loge D.O.   On: 03/18/2021 11:46    Assessment/Plan 1. Chest wall pain Musculoskeletal pain.  Should resolve with time  2. Chronic anemia  Most recent hemoglobin in therapeutic range 3. COPD (chronic obstructive pulmonary disease) with chronic bronchitis No complaints of shortness of breath today.  Continues to use inhalers  4. Primary hypertension BP controlled on metoprolol  5. Osteoarthritis of multiple joints, unspecified osteoarthritis type Thumbs controlled  on combination of Lyrica and Flexeril 6. Prediabetes A1c 6.0 continue to watch carb intake and weight    Family/ staff Communication:   Labs/tests ordered:  .smmsig

## 2022-01-06 ENCOUNTER — Other Ambulatory Visit: Payer: Self-pay | Admitting: Orthopedic Surgery

## 2022-01-06 DIAGNOSIS — G629 Polyneuropathy, unspecified: Secondary | ICD-10-CM

## 2022-01-06 DIAGNOSIS — M159 Polyosteoarthritis, unspecified: Secondary | ICD-10-CM

## 2022-01-06 MED ORDER — PREGABALIN 75 MG PO CAPS: 75.0000 mg | ORAL_CAPSULE | Freq: Every day | ORAL | 0 refills | Status: DC

## 2022-01-27 ENCOUNTER — Encounter: Payer: Self-pay | Admitting: Adult Health

## 2022-01-27 ENCOUNTER — Non-Acute Institutional Stay: Payer: Medicare Other | Admitting: Adult Health

## 2022-01-27 DIAGNOSIS — I10 Essential (primary) hypertension: Secondary | ICD-10-CM | POA: Diagnosis not present

## 2022-01-27 DIAGNOSIS — I63411 Cerebral infarction due to embolism of right middle cerebral artery: Secondary | ICD-10-CM

## 2022-01-27 DIAGNOSIS — M1611 Unilateral primary osteoarthritis, right hip: Secondary | ICD-10-CM | POA: Diagnosis not present

## 2022-01-27 NOTE — Progress Notes (Signed)
Location:   [place] Room Number: 381 Place of Service:  ALF (13) Provider:  Durenda Age, NP  Mast, Man X, NP  Patient Care Team: Mast, Man X, NP as PCP - General (Internal Medicine) Mast, Man X, NP as Nurse Practitioner (Internal Medicine)  Extended Emergency Contact Information Primary Emergency Contact: Sheils,Lisa King Home Phone: [phone] Relation: Son Secondary Emergency Contact: Culbreth Sr.,Lisa King Home Phone: [phone] Relation: Relative  Code Status:  DNR Goals of care: Advanced Directive information    01/27/2022   10:59 AM  Advanced Directives  Does Patient Have a Medical Advance Directive? Yes  Type of Advance Directive Living will;Out of facility DNR (pink MOST or yellow form)  Does patient want to make changes to medical advance directive? No - Patient declined  Pre-existing out of facility DNR order (yellow form or pink MOST form) Yellow form placed in chart (order not valid for inpatient use)     Chief Complaint  Patient presents with   Acute Visit    Right hip pain    HPI:  Pt is a 88 y.o. female seen today for an acute visit for right hip pain. She is a resident of [facility]. She currently takes Tramadol PRN, Lyrica and Flexeril PRN for pain. She was seen in the room today. No reported trauma.   BP 138/70, take Metoprolol tartrate for hypertension.   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     Fibromyalgia    GERD 12/19/2006   Qualifier: Diagnosis of  By: Julien Girt CMA, Marliss Czar  04/13/16 TSH 1.20, Na 141, K 4.2, Bun 15, creat 0.79, wbc 5.5, Hgb 11.5, plt 283    GERD (gastroesophageal reflux disease)    Hammer toe of second toe of left foot 04/08/2016   Left 2nd   Hearing loss    Hemorrhoids    Hypercholesterolemia    Hypertension    Osteoarthritis 12/19/2006   Qualifier: Diagnosis of  By: Julien Girt CMA, Leigh     Osteoporosis    Past Surgical History:  Procedure Laterality Date   BIOPSY  02/17/2020   Procedure: BIOPSY;  Surgeon: Jackquline Denmark, MD;  Location: [facility];  Service: Endoscopy;;   CATARACT EXTRACTION, BILATERAL  2009   COLONOSCOPY  2009, 2006 , 2017   ESOPHAGOGASTRODUODENOSCOPY (EGD) WITH PROPOFOL N/A 02/17/2020   Procedure: ESOPHAGOGASTRODUODENOSCOPY (EGD) WITH PROPOFOL;  Surgeon: Jackquline Denmark, MD;  Location: [facility];  Service: Endoscopy;  Laterality: N/A;   IR ANGIOGRAM SELECTIVE EACH ADDITIONAL VESSEL  02/17/2020   IR ANGIOGRAM SELECTIVE EACH ADDITIONAL VESSEL  02/17/2020   IR ANGIOGRAM SELECTIVE EACH ADDITIONAL VESSEL  02/17/2020   IR ANGIOGRAM SELECTIVE EACH ADDITIONAL VESSEL  02/17/2020   IR ANGIOGRAM SELECTIVE EACH ADDITIONAL VESSEL  02/17/2020   IR  ANGIOGRAM VISCERAL SELECTIVE  02/17/2020   IR ANGIOGRAM VISCERAL SELECTIVE  02/17/2020   IR US GUIDE VASC ACCESS RIGHT  02/17/2020   stapes surgery     Dr Thornell Mule   TONSILLECTOMY AND ADENOIDECTOMY     as a child    Allergies  Allergen Reactions   Penicillins Anaphylaxis   Bisphosphonates Other (See Comments)    [name]   Influenza Vaccines     Unknown   Simvastatin     Unknown    Trazodone And Nefazodone     Felt her throat was closing up/kept her awake   Sulfonamide Derivatives Rash and Other (See Comments)    blisters    Allergies as of 01/27/2022       Reactions   Penicillins Anaphylaxis    Bisphosphonates Other (See Comments)   [name]   Influenza Vaccines    Unknown   Simvastatin    Unknown    Trazodone And Nefazodone    Felt her throat was closing up/kept her awake   Sulfonamide Derivatives Rash, Other (See Comments)   blisters        Medication List        Accurate as of January 27, 2022 11:14 AM. If you have any questions, ask your nurse or doctor.          STOP taking these medications    cetirizine 10 MG tablet Commonly known as: ZYRTEC Stopped by: Durenda Age, NP   guaiFENesin 600 MG 12 hr tablet Commonly known as: Mucinex Stopped by: Durenda Age, NP       TAKE these medications    albuterol 108 (90 Base) MCG/ACT inhaler Commonly known as: VENTOLIN HFA Inhale into the lungs every 6 (six) hours as needed for wheezing or shortness of breath.   benzonatate 100 MG capsule Commonly known as: TESSALON Take 100 mg by mouth 2 (two) times daily.   budesonide-formoterol 80-4.5 MCG/ACT inhaler Commonly known as: SYMBICORT Inhale 1 puff into the lungs daily.   calcium carbonate 750 MG chewable tablet Commonly known as: TUMS EX Chew 750 mg by mouth daily.   cholecalciferol 1000 units tablet Commonly known as: VITAMIN D Take 1,000 Units by mouth daily.   cyanocobalamin 1000 MCG tablet Commonly known as: VITAMIN B12 Take 1,000 mcg by mouth daily.   cyclobenzaprine 5 MG tablet Commonly known as: FLEXERIL Take 2.5 mg by mouth at bedtime.   docusate sodium 100 MG capsule Commonly known as: COLACE Take 100 mg by mouth 2 (two) times daily.   ferrous sulfate 325 (65 FE) MG tablet Take 325 mg by mouth. Once A Day on Mon, Wed, Fri   hydrocortisone cream 1 % Apply 1 application topically 2 (two) times daily as needed (wrists).   hydrocortisone valerate cream 0.2 % Commonly known as: WESTCORT Apply 1 application topically 2 (two) times daily as needed (for itching). Cleanse behind ears with gentle cleanser and dry  completely.   ipratropium-albuterol 0.5-2.5 (3) MG/3ML Soln Commonly known as: DUONEB Take 3 mLs by nebulization in the morning.   ipratropium-albuterol 0.5-2.5 (3) MG/3ML Soln Commonly known as: DUONEB Take 3 mLs by nebulization daily as needed.   ketoconazole 2 % cream Commonly known as: NIZORAL Apply 1 application topically 2 (two) times daily as needed for irritation.   metoprolol tartrate 25 MG tablet Commonly known as: LOPRESSOR Take 12.5 mg by mouth 2 (two) times daily.   nitrofurantoin 50 MG capsule Commonly known as: MACRODANTIN Take 50 mg by mouth  at bedtime.   Olopatadine HCl 0.2 % Soln Place 1 drop into both eyes daily.   polyethylene glycol 17 g packet Commonly known as: MIRALAX / GLYCOLAX Take 17 g by mouth 2 (two) times a week. Sunday and Wednesday   predniSONE 5 MG tablet Commonly known as: DELTASONE Take 5 mg by mouth daily. What changed: Another medication with the same name was removed. Continue taking this medication, and follow the directions you see here. Changed by: Durenda Age, NP   pregabalin 75 MG capsule Commonly known as: LYRICA Take 1 capsule (75 mg total) by mouth at bedtime.   Propylene Glycol 0.6 % Soln Place 1 drop into both eyes 4 (four) times daily.   traMADol 50 MG tablet Commonly known as: ULTRAM Take 0.5 tablets (25 mg total) by mouth every 8 (eight) hours as needed.        Review of Systems  Constitutional:  Negative for appetite change, chills, fatigue and fever.  HENT:  Negative for congestion, hearing loss, rhinorrhea and sore throat.   Eyes: Negative.   Respiratory:  Negative for cough, shortness of breath and wheezing.   Cardiovascular:  Negative for chest pain, palpitations and leg swelling.  Gastrointestinal:  Negative for abdominal pain, constipation, diarrhea, nausea and vomiting.  Genitourinary:  Negative for dysuria.  Musculoskeletal:  Positive for arthralgias and joint swelling. Negative for back  pain and myalgias.       Right hip pain  Skin:  Negative for color change, rash and wound.  Neurological:  Negative for dizziness, weakness and headaches.  Psychiatric/Behavioral:  Negative for behavioral problems. The patient is not nervous/anxious.     Immunization History  Administered Date(s) Administered   Moderna SARS-COV2 Booster Vaccination 12/25/2019   Moderna Sars-Covid-2 Vaccination 02/17/2019, 03/17/2019   Pneumococcal Conjugate-13 09/01/2015   Pneumococcal Polysaccharide-23 07/07/1998   Td 08/21/2009   Pertinent  Health Maintenance Due  Topic Date Due   DEXA SCAN  Completed   INFLUENZA VACCINE  Discontinued      03/18/2021   10:52 AM 03/19/2021    3:00 AM 03/19/2021    8:00 AM 03/20/2021   12:00 AM 03/20/2021   12:00 PM  Fall Risk  Patient Fall Risk Level High fall risk High fall risk High fall risk High fall risk High fall risk   Functional Status Survey:    Vitals:   01/27/22 1057  BP: 138/70  Pulse: 77  Resp: 18  Temp: 97.8 F (36.6 C)  SpO2: 96%  Weight: 149 lb 6.4 oz (67.8 kg)  Height: '5\' 1"'$  (1.549 m)   Body mass index is 28.23 kg/m. Physical Exam Constitutional:      General: She is not in acute distress.    Appearance: Normal appearance.  HENT:     Head: Normocephalic and atraumatic.     Nose: Nose normal.     Mouth/Throat:     Mouth: Mucous membranes are moist.  Eyes:     Conjunctiva/sclera: Conjunctivae normal.  Cardiovascular:     Rate and Rhythm: Normal rate and regular rhythm.  Pulmonary:     Effort: Pulmonary effort is normal.     Breath sounds: Normal breath sounds.  Abdominal:     General: Bowel sounds are normal.     Palpations: Abdomen is soft.  Musculoskeletal:        General: Swelling present.     Cervical back: Normal range of motion.     Right lower leg: Edema present.     Comments: Right  ankle 1+edema   Skin:    General: Skin is warm and dry.  Neurological:     General: No focal deficit present.     Mental Status: She  is alert and oriented to person, place, and time.  Psychiatric:        Mood and Affect: Mood normal.        Behavior: Behavior normal.        Thought Content: Thought content normal.        Judgment: Judgment normal.     Labs reviewed: Recent Labs    03/18/21 1039 03/20/21 0456 03/24/21 0000 03/26/21 0000 04/09/21 0000 06/12/21 0000  NA 136 135   < > 140 135* 137  K 3.9 3.2*   < > 4.0 4.3 4.4  CL 101 106   < > 107 103 104  CO2 26 22   < > 27* 25* 25*  GLUCOSE 143* 96  --   --   --   --   BUN 18 20   < > '13 13 16  '$ CREATININE 0.99 0.70   < > 0.8 1.0 1.2*  CALCIUM 9.7 8.1*   < > 8.9 8.7 8.8   < > = values in this interval not displayed.   Recent Labs    03/18/21 1039 04/09/21 0000 06/12/21 0000  AST '22 13 16  '$ ALT '15 8 10  '$ ALKPHOS 87 80 82  BILITOT 0.9  --   --   PROT 7.7  --   --   ALBUMIN 4.5 3.8 4.0   Recent Labs    03/18/21 1039 03/20/21 0456 03/26/21 0000 04/09/21 0000 06/12/21 0000  WBC 7.2 7.8 5.5 5.2 3.8  NEUTROABS 6.2 6.0 3,641.00 4,113.00 2,466.00  HGB 13.7 11.1* 10.7* 11.5* 11.8*  HCT 41.3 34.1* 32* 35* 36  MCV 92.2 93.7  --   --   --   PLT 204 145* 207 198 180   Lab Results  Component Value Date   TSH 0.661 03/19/2021   Lab Results  Component Value Date   HGBA1C 6.0 (H) 03/19/2021   Lab Results  Component Value Date   CHOL 192 03/19/2021   HDL 69 03/19/2021   LDLCALC 107 (H) 03/19/2021   LDLDIRECT 114.7 08/18/2011   TRIG 78 03/19/2021   CHOLHDL 2.8 03/19/2021    Significant Diagnostic Results in last 30 days:  No results found.  Assessment/Plan  1. Primary osteoarthritis of right hip -  will increase Tramadol from 25 mg Q 8 hours PRN to 25 mg Q 6 hours PRN -  continue Lyrica and Flexeril  2. Primary hypertension -Blood pressure well controlled Continue current medication     Family/ staff Communication:  Discussed plan of care with resident and charge nurse.  Labs/tests ordered:   None

## 2022-02-05 ENCOUNTER — Encounter: Payer: Self-pay | Admitting: Orthopedic Surgery

## 2022-02-05 ENCOUNTER — Non-Acute Institutional Stay: Payer: Medicare Other | Admitting: Orthopedic Surgery

## 2022-02-05 DIAGNOSIS — R7303 Prediabetes: Secondary | ICD-10-CM

## 2022-02-05 DIAGNOSIS — J4489 Other specified chronic obstructive pulmonary disease: Secondary | ICD-10-CM

## 2022-02-05 DIAGNOSIS — I10 Essential (primary) hypertension: Secondary | ICD-10-CM | POA: Diagnosis not present

## 2022-02-05 DIAGNOSIS — I63411 Cerebral infarction due to embolism of right middle cerebral artery: Secondary | ICD-10-CM

## 2022-02-05 DIAGNOSIS — J441 Chronic obstructive pulmonary disease with (acute) exacerbation: Secondary | ICD-10-CM

## 2022-02-05 MED ORDER — PREDNISONE 20 MG PO TABS: 40.0000 mg | ORAL_TABLET | Freq: Every day | ORAL | 0 refills | Status: AC

## 2022-02-05 MED ORDER — IPRATROPIUM-ALBUTEROL 0.5-2.5 (3) MG/3ML IN SOLN: 3.0000 mL | Freq: Two times a day (BID) | RESPIRATORY_TRACT | 0 refills | Status: DC

## 2022-02-05 NOTE — Progress Notes (Signed)
Location:  [facility] Room Number: 917A Place of Service:  ALF [phone]) Provider:  Windell Moulding, NP   Patient Care Team: Mast, Man X, NP as PCP - General (Internal Medicine) Mast, Man X, NP as Nurse Practitioner (Internal Medicine)  Extended Emergency Contact Information Primary Emergency Contact: Ethier,Jeff Home Phone: [phone] Relation: Son Secondary Emergency Contact: Culbreth Sr.,Christopher Home Phone: [phone] Relation: Relative  Code Status:  DNR Goals of care: Advanced Directive information    02/05/2022    9:15 AM  Advanced Directives  Does Patient Have a Medical Advance Directive? Yes  Type of Advance Directive Living will;Out of facility DNR (pink MOST or yellow form)  Does patient want to make changes to medical advance directive? No - Patient declined  Pre-existing out of facility DNR order (yellow form or pink MOST form) Yellow form placed in chart (order not valid for inpatient use)     Chief Complaint  Patient presents with   Acute Visit    Patient to be seen for wheezing at [facility].    HPI:  Lisa King is a 88 y.o. female seen today for an acute visit for wheezing.  She currently resides on the assisted living unit at [facility]. PMH: CVA, HTN, COPD with chronic oxygen, chronic gastritis, GERD, constipation,neuropathy, osteoporosis, and anxiety.   12/21 nursing reports wheezing and increased cough. She was given duoneb and symptoms improved. This morning she reports feeling more short of breath, wheezing has returned. Appears to be coughing every time she talks or takes deep breath. Denies fever, sore throat, body aches, nasal congestion, N/V. She remains on 2 liters oxygen, O2 sats > 90%. She takes Symbicort, duonebs, Zyrtec and prednisone daily.   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     Fibromyalgia    GERD 12/19/2006   Qualifier: Diagnosis of  By: Julien Girt CMA, Marliss Czar  04/13/16 TSH 1.20, Na 141, K 4.2, Bun 15, creat 0.79, wbc 5.5, Hgb 11.5, plt 283    GERD (gastroesophageal reflux disease)    Hammer toe of second toe of left foot 04/08/2016   Left 2nd   Hearing loss    Hemorrhoids    Hypercholesterolemia    Hypertension    Osteoarthritis 12/19/2006   Qualifier: Diagnosis of  By: Julien Girt CMA, Leigh     Osteoporosis    Past Surgical History:  Procedure Laterality Date   BIOPSY  02/17/2020   Procedure: BIOPSY;  Surgeon: Jackquline Denmark, MD;  Location: [facility];  Service: Endoscopy;;   CATARACT EXTRACTION, BILATERAL  2009   COLONOSCOPY  2009, 2006 , 2017   ESOPHAGOGASTRODUODENOSCOPY (EGD) WITH PROPOFOL N/A 02/17/2020   Procedure: ESOPHAGOGASTRODUODENOSCOPY (EGD) WITH PROPOFOL;  Surgeon: Jackquline Denmark, MD;  Location: [facility] ENDOSCOPY;  Service: Endoscopy;  Laterality: N/A;   IR ANGIOGRAM SELECTIVE EACH ADDITIONAL VESSEL  02/17/2020   IR ANGIOGRAM SELECTIVE EACH ADDITIONAL VESSEL  02/17/2020   IR ANGIOGRAM SELECTIVE EACH ADDITIONAL VESSEL  02/17/2020   IR ANGIOGRAM SELECTIVE EACH ADDITIONAL VESSEL  02/17/2020   IR ANGIOGRAM SELECTIVE EACH ADDITIONAL VESSEL  02/17/2020   IR ANGIOGRAM VISCERAL  SELECTIVE  02/17/2020   IR ANGIOGRAM VISCERAL SELECTIVE  02/17/2020   IR US GUIDE VASC ACCESS RIGHT  02/17/2020   stapes surgery     Dr Thornell Mule   TONSILLECTOMY AND ADENOIDECTOMY     as a child    Allergies  Allergen Reactions   Penicillins Anaphylaxis   Bisphosphonates Other (See Comments)    Lisa King states INTOL   Influenza Vaccines     Unknown   Simvastatin     Unknown    Trazodone And Nefazodone     Felt her throat was closing up/kept her awake   Sulfonamide Derivatives Rash and Other (See Comments)    blisters    Outpatient Encounter Medications as of 02/05/2022  Medication Sig   albuterol (VENTOLIN HFA) 108 (90 Base) MCG/ACT inhaler Inhale into the lungs every 6 (six) hours as needed for wheezing or shortness of breath.   benzonatate (TESSALON) 100 MG capsule Take 100 mg by mouth 2 (two) times daily.   budesonide-formoterol (SYMBICORT) 80-4.5 MCG/ACT inhaler Inhale 1 puff into the lungs daily.   calcium carbonate (TUMS EX) 750 MG chewable tablet Chew 750 mg by mouth daily.    cholecalciferol (VITAMIN D) 1000 UNITS tablet Take 1,000 Units by mouth daily.   cyclobenzaprine (FLEXERIL) 5 MG tablet Take 2.5 mg by mouth at bedtime.   docusate sodium (COLACE) 100 MG capsule Take 100 mg by mouth 2 (two) times daily.   ferrous sulfate 325 (65 FE) MG tablet Take 325 mg by mouth. Once A Day on Mon, Wed, Fri   hydrocortisone cream 1 % Apply 1 application topically 2 (two) times daily as needed (wrists).   hydrocortisone valerate cream (WESTCORT) 0.2 % Apply 1 application topically 2 (two) times daily as needed (for itching). Cleanse behind ears with gentle cleanser and dry completely.   ipratropium-albuterol (DUONEB) 0.5-2.5 (3) MG/3ML SOLN Take 3 mLs by nebulization in the morning.   ipratropium-albuterol (DUONEB) 0.5-2.5 (3) MG/3ML SOLN Take 3 mLs by nebulization daily as needed.   ketoconazole (NIZORAL) 2 % cream Apply 1 application topically 2 (two) times daily as needed for irritation.    metoprolol tartrate (LOPRESSOR) 25 MG tablet Take 12.5 mg by mouth 2 (two) times daily.   nitrofurantoin (MACRODANTIN) 50 MG capsule Take 50 mg by mouth at bedtime.   Olopatadine HCl 0.2 % SOLN Place 1 drop into both eyes daily.   polyethylene glycol (MIRALAX / GLYCOLAX) 17 g packet Take 17 g by mouth 2 (two) times a week. Sunday and Wednesday   predniSONE (DELTASONE) 5 MG tablet Take 5 mg by mouth daily.   pregabalin (LYRICA) 75 MG capsule Take 1 capsule (75 mg total) by mouth at bedtime.   Propylene Glycol 0.6 % SOLN Place 1 drop into both eyes 4 (four) times daily.   traMADol (ULTRAM) 50 MG tablet Take 0.5 tablets (25 mg total) by mouth every 8 (eight) hours as needed.   vitamin B-12 (CYANOCOBALAMIN) 1000 MCG tablet Take 1,000 mcg  by mouth daily.   No facility-administered encounter medications on file as of 02/05/2022.    Review of Systems  Constitutional:  Negative for chills, diaphoresis, fatigue and fever.  HENT:  Negative for rhinorrhea and sore throat.   Respiratory:  Positive for cough, shortness of breath and wheezing.   Cardiovascular:  Negative for chest pain and leg swelling.  Gastrointestinal:  Negative for diarrhea, nausea and vomiting.  Musculoskeletal:  Negative for myalgias.  Skin:  Negative for rash.  Neurological:  Positive for weakness. Negative for dizziness and headaches.  Psychiatric/Behavioral:  Negative for confusion and dysphoric mood. The patient is not nervous/anxious.     Immunization History  Administered Date(s) Administered   Moderna SARS-COV2 Booster Vaccination 12/25/2019   Moderna Sars-Covid-2 Vaccination 02/17/2019, 03/17/2019   Pneumococcal Conjugate-13 09/01/2015   Pneumococcal Polysaccharide-23 07/07/1998   Td 08/21/2009   Pertinent  Health Maintenance Due  Topic Date Due   DEXA SCAN  Completed   INFLUENZA VACCINE  Discontinued      03/19/2021    3:00 AM 03/19/2021    8:00 AM 03/20/2021   12:00 AM 03/20/2021   12:00 PM 02/05/2022    9:16 AM   Fall Risk  Falls in the past year?     0  Was there an injury with Fall?     0  Fall Risk Category Calculator     0  Fall Risk Category     Low  Patient Fall Risk Level High fall risk High fall risk High fall risk High fall risk High fall risk  Patient at Risk for Falls Due to     History of fall(s)  Fall risk Follow up     Falls evaluation completed   Functional Status Survey:    Vitals:   02/05/22 0907  BP: 136/68  Pulse: 74  Resp: 18  Temp: 98.2 F (36.8 C)  SpO2: 96%  Weight: 149 lb 4 oz (67.7 kg)  Height: '5\' 1"'$  (1.549 m)   Body mass index is 28.2 kg/m. Physical Exam Vitals reviewed.  Constitutional:      General: She is not in acute distress.    Appearance: She is not ill-appearing.  HENT:     Head: Normocephalic.  Eyes:     General:        Right eye: No discharge.        Left eye: No discharge.  Cardiovascular:     Rate and Rhythm: Normal rate and regular rhythm.     Pulses: Normal pulses.     Heart sounds: Normal heart sounds.  Pulmonary:     Effort: Pulmonary effort is normal. No respiratory distress.     Breath sounds: Wheezing present.     Comments: 2 liters oxygen, inspiratory and expiratory to upper/middle lobes Abdominal:     General: Bowel sounds are normal. There is no distension.     Palpations: Abdomen is soft.     Tenderness: There is no abdominal tenderness.  Musculoskeletal:     Cervical back: Neck supple.     Right lower leg: No edema.     Left lower leg: No edema.  Skin:    General: Skin is warm and dry.     Capillary Refill: Capillary refill takes less than 2 seconds.  Neurological:     General: No focal deficit present.     Mental Status: She is alert and oriented to person, place, and time.     Motor: Weakness present.     Gait:  Gait abnormal.     Comments: wheelchair  Psychiatric:        Mood and Affect: Mood normal.        Behavior: Behavior normal.     Labs reviewed: Recent Labs    03/18/21 1039 03/20/21 0456  03/24/21 0000 03/26/21 0000 04/09/21 0000 06/12/21 0000  NA 136 135   < > 140 135* 137  K 3.9 3.2*   < > 4.0 4.3 4.4  CL 101 106   < > 107 103 104  CO2 26 22   < > 27* 25* 25*  GLUCOSE 143* 96  --   --   --   --   BUN 18 20   < > '13 13 16  '$ CREATININE 0.99 0.70   < > 0.8 1.0 1.2*  CALCIUM 9.7 8.1*   < > 8.9 8.7 8.8   < > = values in this interval not displayed.   Recent Labs    03/18/21 1039 04/09/21 0000 06/12/21 0000  AST '22 13 16  '$ ALT '15 8 10  '$ ALKPHOS 87 80 82  BILITOT 0.9  --   --   PROT 7.7  --   --   ALBUMIN 4.5 3.8 4.0   Recent Labs    03/18/21 1039 03/20/21 0456 03/26/21 0000 04/09/21 0000 06/12/21 0000  WBC 7.2 7.8 5.5 5.2 3.8  NEUTROABS 6.2 6.0 3,641.00 4,113.00 2,466.00  HGB 13.7 11.1* 10.7* 11.5* 11.8*  HCT 41.3 34.1* 32* 35* 36  MCV 92.2 93.7  --   --   --   PLT 204 145* 207 198 180   Lab Results  Component Value Date   TSH 0.661 03/19/2021   Lab Results  Component Value Date   HGBA1C 6.0 (H) 03/19/2021   Lab Results  Component Value Date   CHOL 192 03/19/2021   HDL 69 03/19/2021   LDLCALC 107 (H) 03/19/2021   LDLDIRECT 114.7 08/18/2011   TRIG 78 03/19/2021   CHOLHDL 2.8 03/19/2021    Significant Diagnostic Results in last 30 days:  No results found.  Assessment/Plan 1. Acute exacerbation of chronic obstructive pulmonary disease (COPD) (HCC) - Onset x 1 day - inspiratory/expiratory wheezing to upper and middle lobes - no cold symptoms - start prednisone 40 mg po Qam x 5 days, then resume normal dose - increase duonebs to BID x 5 days, then resume normal dose  2. Cerebrovascular accident (CVA) due to embolism of right middle cerebral artery ([place]) - no further workup  3. Primary hypertension - controlled - cont metoprolol  4. Prediabetes - diet controlled     Family/ staff Communication: plan discussed with patient and nurse  Labs/tests ordered:  none

## 2022-02-10 ENCOUNTER — Other Ambulatory Visit: Payer: Self-pay | Admitting: Adult Health

## 2022-02-10 DIAGNOSIS — M159 Polyosteoarthritis, unspecified: Secondary | ICD-10-CM

## 2022-02-10 DIAGNOSIS — G629 Polyneuropathy, unspecified: Secondary | ICD-10-CM

## 2022-02-10 MED ORDER — PREGABALIN 75 MG PO CAPS: 75.0000 mg | ORAL_CAPSULE | Freq: Every day | ORAL | 0 refills | Status: DC

## 2022-02-12 ENCOUNTER — Non-Acute Institutional Stay: Payer: Medicare Other | Admitting: Nurse Practitioner

## 2022-02-12 ENCOUNTER — Encounter: Payer: Self-pay | Admitting: Nurse Practitioner

## 2022-02-12 DIAGNOSIS — J4489 Other specified chronic obstructive pulmonary disease: Secondary | ICD-10-CM

## 2022-02-12 DIAGNOSIS — I10 Essential (primary) hypertension: Secondary | ICD-10-CM

## 2022-02-12 DIAGNOSIS — R0602 Shortness of breath: Secondary | ICD-10-CM | POA: Diagnosis not present

## 2022-02-12 DIAGNOSIS — M797 Fibromyalgia: Secondary | ICD-10-CM

## 2022-02-12 DIAGNOSIS — D62 Acute posthemorrhagic anemia: Secondary | ICD-10-CM | POA: Diagnosis not present

## 2022-02-12 DIAGNOSIS — N390 Urinary tract infection, site not specified: Secondary | ICD-10-CM

## 2022-02-12 DIAGNOSIS — H353130 Nonexudative age-related macular degeneration, bilateral, stage unspecified: Secondary | ICD-10-CM

## 2022-02-12 DIAGNOSIS — N952 Postmenopausal atrophic vaginitis: Secondary | ICD-10-CM | POA: Diagnosis not present

## 2022-02-12 DIAGNOSIS — I63411 Cerebral infarction due to embolism of right middle cerebral artery: Secondary | ICD-10-CM | POA: Diagnosis not present

## 2022-02-12 DIAGNOSIS — K219 Gastro-esophageal reflux disease without esophagitis: Secondary | ICD-10-CM | POA: Diagnosis not present

## 2022-02-12 DIAGNOSIS — R509 Fever, unspecified: Secondary | ICD-10-CM | POA: Diagnosis not present

## 2022-02-12 DIAGNOSIS — K5901 Slow transit constipation: Secondary | ICD-10-CM

## 2022-02-12 NOTE — Assessment & Plan Note (Signed)
persisted coughing, wheezing, fever of 100.2, poor oral intake, needs O2 [place] to maintain Sat O2>90%, appears weak and less interactive in general. Denied chest pain, palpitation, no noted new focal weakness.   Will start Doxycycline 130m bid x 7days, increase Prednisone 261mqd x 7days, obtain CXR ap/lateral, CBC/diff, CMP/eGFR, UA C/S in setting of fever and c/o odorous urine.    COPD, diffused mild expiratory wheezes, c/o SOB, uses O2 more than prior,  takes Symbicort, Zyrtec, Mucinex, DuoNeb, prn Albuterol, cough is chronic, on Prednisone low dose. 02/05/22 increased Prednisone to 409md x 5 days, DuoNeb bid x 5 days.

## 2022-02-12 NOTE — Assessment & Plan Note (Signed)
Stable,  takes MiraLax 2x/wk. Colace daily.

## 2022-02-12 NOTE — Assessment & Plan Note (Signed)
GERD/chronic gastritis/atrophic gastritis, GI bleed 02/2020, s/p EGD, per biopsy, takes Protonix. F/u GI prn

## 2022-02-12 NOTE — Assessment & Plan Note (Signed)
Low grade, COVID test persisted coughing, wheezing, fever of 100.2, poor oral intake, needs O2 [place] to maintain Sat O2>90%, appears weak and less interactive in general. Denied chest pain, palpitation, no noted new focal weakness.   Will start Doxycycline 129m bid x 7days, increase Prednisone 228mqd x 7days, obtain CXR ap/lateral, CBC/diff, CMP/eGFR, UA C/S in setting of fever and c/o odorous urine.

## 2022-02-12 NOTE — Assessment & Plan Note (Signed)
CT x2 03/2021 showed no acute findings except age indeterminate right corona radiator infarct, small vessel changes. Neurology recommended no further work ups.

## 2022-02-12 NOTE — Assessment & Plan Note (Signed)
Hx of Hgb 6.8 in hospital s/p 2 units of PRBC,  Vit B12, Iron, Hgb 11.8 06/12/21

## 2022-02-12 NOTE — Progress Notes (Addendum)
Location:  [facility] Room Number: [id number]/A Place of Service:  SNF (31) Provider:  Synia Douglass X, NP  Patient Care Team: Tarius Stangelo X, NP as PCP - General (Internal Medicine) Jamel Dunton X, NP as Nurse Practitioner (Internal Medicine)  Extended Emergency Contact Information Primary Emergency Contact: Luger,Jeff Home Phone: [phone] Relation: Son Secondary Emergency Contact: Culbreth Sr.,Christopher Home Phone: [phone] Relation: Relative  Code Status:  DNNR Goals of care: Advanced Directive information    02/05/2022    9:15 AM  Advanced Directives  Does Patient Have a Medical Advance Directive? Yes  Type of Advance Directive Living will;Out of facility DNR (pink MOST or yellow form)  Does patient want to make changes to medical advance directive? No - Patient declined  Pre-existing out of facility DNR order (yellow form or pink MOST form) Yellow form placed in chart (order not valid for inpatient use)     Chief Complaint  Patient presents with   Acute Visit    Patient is being seen for fever    HPI:  Pt is a 88 y.o. female seen today for an acute visit for persisted coughing, wheezing, fever of 100.2, poor oral intake, needs O2 [place] to maintain Sat O2>90%, appears weak and less interactive in general. Denied chest pain, palpitation, no noted new focal weakness.     COPD, diffused mild expiratory wheezes, c/o SOB, uses O2 more than prior,  takes Symbicort, Zyrtec, Mucinex, DuoNeb, prn Albuterol, cough is chronic, on Prednisone low dose. 02/05/22 increased Prednisone to 40mg  qd x 5 days, DuoNeb bid x 5 days.  Hx of CVA, CT x2 03/2021 showed no acute findings except age indeterminate right corona radiator infarct, small vessel changes. Neurology recommended no further work ups.              Macular degeneration, f/u [facility]: OU for [place] MD involving tcentral area. 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 Bun/creat 16/1.2 06/12/21             Urinary symptoms, on  Nitrofurantoin 50mg  qd for UTI suppression, Dr. Vernie Ammons.              Estrace vaginal cream for atrophic vaginitis.              Anemia, Hx of Hgb 6.8 in hospital s/p 2 units of PRBC,  Vit B12, Iron, Hgb 11.8 06/12/21             GERD/chronic gastritis/atrophic gastritis, GI bleed 02/2020, s/p EGD, per biopsy, takes Protonix. F/u GI prn Fibromyalgia takes Lyrica             Tactile hallucinations, off meds. TSH 0.661 03/19/21             Constipation, takes MiraLax 2x/wk. Colace daily.              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     Fibromyalgia    GERD 12/19/2006   Qualifier: Diagnosis of  By: Renaldo Fiddler CMA, Marliss Czar  04/13/16 TSH 1.20, Na 141, K 4.2, Bun 15, creat 0.79, wbc 5.5, Hgb 11.5, plt 283    GERD (gastroesophageal reflux disease)    Hammer toe of second toe of left foot 04/08/2016   Left 2nd   Hearing loss    Hemorrhoids    Hypercholesterolemia    Hypertension    Osteoarthritis 12/19/2006   Qualifier: Diagnosis of  By: Renaldo Fiddler CMA, Leigh     Osteoporosis    Past Surgical History:  Procedure Laterality Date   BIOPSY  02/17/2020   Procedure: BIOPSY;  Surgeon: Lynann Bologna, MD;   Location: [facility];  Service: Endoscopy;;   CATARACT EXTRACTION, BILATERAL  2009   COLONOSCOPY  2009, 2006 , 2017   ESOPHAGOGASTRODUODENOSCOPY (EGD) WITH PROPOFOL N/A 02/17/2020   Procedure: ESOPHAGOGASTRODUODENOSCOPY (EGD) WITH PROPOFOL;  Surgeon: Lynann Bologna, MD;  Location: [facility];  Service: Endoscopy;  Laterality: N/A;   IR ANGIOGRAM SELECTIVE EACH ADDITIONAL VESSEL  02/17/2020   IR ANGIOGRAM SELECTIVE EACH ADDITIONAL VESSEL  02/17/2020   IR ANGIOGRAM SELECTIVE EACH ADDITIONAL VESSEL  02/17/2020   IR ANGIOGRAM SELECTIVE EACH ADDITIONAL VESSEL  02/17/2020   IR ANGIOGRAM SELECTIVE EACH ADDITIONAL VESSEL  02/17/2020   IR ANGIOGRAM VISCERAL SELECTIVE  02/17/2020   IR ANGIOGRAM VISCERAL SELECTIVE  02/17/2020   IR US GUIDE VASC ACCESS RIGHT  02/17/2020   stapes surgery     Dr Dorma Russell   TONSILLECTOMY AND ADENOIDECTOMY     as a child    Allergies  Allergen Reactions   Penicillins Anaphylaxis   Bisphosphonates Other (See Comments)    [name]   Influenza Vaccines     Unknown   Simvastatin     Unknown    Trazodone And Nefazodone     Felt her throat was closing up/kept her awake   Sulfonamide Derivatives Rash and Other (See Comments)    blisters    Outpatient Encounter Medications as of 02/12/2022  Medication Sig   albuterol (VENTOLIN HFA) 108 (90 Base) MCG/ACT inhaler Inhale into the lungs every 6 (six) hours as needed for wheezing or shortness of breath.   benzonatate (TESSALON) 100 MG capsule Take 100 mg by mouth 2 (two) times daily.   budesonide-formoterol (SYMBICORT) 80-4.5 MCG/ACT inhaler Inhale 1 puff into the lungs daily.   calcium carbonate (TUMS EX) 750 MG chewable tablet Chew 750 mg by mouth daily.    cholecalciferol (VITAMIN D) 1000 UNITS tablet Take 1,000 Units by mouth daily.   cyclobenzaprine (FLEXERIL) 5 MG tablet Take 2.5 mg by mouth at bedtime.   docusate sodium (COLACE) 100 MG capsule Take 100 mg by mouth 2 (two) times daily.   ferrous sulfate 325 (65 FE) MG  tablet Take 325 mg by mouth. Once A Day on Mon, Wed, Fri   hydrocortisone cream 1 % Apply 1 application topically 2 (two) times daily as needed (wrists).   hydrocortisone  valerate cream (WESTCORT) 0.2 % Apply 1 application topically 2 (two) times daily as needed (for itching). Cleanse behind ears with gentle cleanser and dry completely.   ipratropium-albuterol (DUONEB) 0.5-2.5 (3) MG/3ML SOLN Take 3 mLs by nebulization daily as needed.   ketoconazole (NIZORAL) 2 % cream Apply 1 application topically 2 (two) times daily as needed for irritation.   metoprolol tartrate (LOPRESSOR) 25 MG tablet Take 12.5 mg by mouth 2 (two) times daily.   nitrofurantoin (MACRODANTIN) 50 MG capsule Take 50 mg by mouth at bedtime.   Olopatadine HCl 0.2 % SOLN Place 1 drop into both eyes daily.   polyethylene glycol (MIRALAX / GLYCOLAX) 17 g packet Take 17 g by mouth 2 (two) times a week. Sunday and Wednesday   predniSONE (DELTASONE) 5 MG tablet Take 5 mg by mouth daily.   pregabalin (LYRICA) 75 MG capsule Take 1 capsule (75 mg total) by mouth at bedtime.   Propylene Glycol 0.6 % SOLN Place 1 drop into both eyes 4 (four) times daily.   traMADol (ULTRAM) 50 MG tablet Take 0.5 tablets (25 mg total) by mouth every 8 (eight) hours as needed.   vitamin B-12 (CYANOCOBALAMIN) 1000 MCG tablet Take 1,000 mcg by mouth daily.   ipratropium-albuterol (DUONEB) 0.5-2.5 (3) MG/3ML SOLN Take 3 mLs by nebulization 2 (two) times daily for 5 days.   No facility-administered encounter medications on file as of 02/12/2022.    Review of Systems  Constitutional:  Positive for appetite change, fatigue and fever.  HENT:  Positive for hearing loss. Negative for congestion and voice change.   Eyes:  Negative for visual disturbance.       C/o blurred vision, ? Color, ? Right eye peripheral vision loss-f/u Ophthalmology  Respiratory:  Positive for cough, shortness of breath and wheezing. Negative for chest tightness.        DOE   Cardiovascular:  Positive for leg swelling. Negative for chest pain.  Gastrointestinal:  Negative for abdominal pain and constipation.  Genitourinary:  Negative for dysuria and urgency.       Chronic, on and off  Musculoskeletal:  Positive for arthralgias, back pain and gait problem. Negative for myalgias.  Skin:  Negative for color change.  Neurological:  Negative for speech difficulty, weakness and light-headedness.  Psychiatric/Behavioral:  Negative for behavioral problems, hallucinations and sleep disturbance.        Chronic going to bed late, sleeping in late. Lethargic upon my examination.     Immunization History  Administered Date(s) Administered   Moderna SARS-COV2 Booster Vaccination 12/25/2019   Moderna Sars-Covid-2 Vaccination 02/17/2019, 03/17/2019   Pneumococcal Conjugate-13 09/01/2015   Pneumococcal Polysaccharide-23 07/07/1998   Td 08/21/2009   Pertinent  Health Maintenance Due  Topic Date Due   DEXA SCAN  Completed   INFLUENZA VACCINE  Discontinued      03/19/2021    3:00 AM 03/19/2021    8:00 AM 03/20/2021   12:00 AM 03/20/2021   12:00 PM 02/05/2022    9:16 AM  Fall Risk  Falls in the past year?     0  Was there an injury with Fall?     0  Fall Risk Category Calculator     0  Fall Risk Category     Low  Patient Fall Risk Level High fall risk High fall risk High fall risk High fall risk High fall risk  Patient at Risk for Falls Due to     History of fall(s)  Fall risk Follow up  Falls evaluation completed   Functional Status Survey:    Vitals:   02/12/22 1348  BP: 132/66  Pulse: 68  Resp: 18  Temp: 98.2 F (36.8 C)  SpO2: 95%  Weight: 149 lb 6.4 oz (67.8 kg)  Height: 5\' 1"  (1.549 m)   Body mass index is 28.23 kg/m. Physical Exam Vitals and nursing note reviewed.  Constitutional:      Comments: Tired, less interactive  HENT:     Head: Normocephalic and atraumatic.     Nose: Nose normal.     Mouth/Throat:     Mouth: Mucous membranes are  moist.  Eyes:     Extraocular Movements: Extraocular movements intact.     Conjunctiva/sclera: Conjunctivae normal.     Pupils: Pupils are equal, round, and reactive to light.     Comments: Able to count my fingers 3 feet away from her eyes.   Cardiovascular:     Rate and Rhythm: Normal rate and regular rhythm.     Heart sounds: No murmur heard. Pulmonary:     Breath sounds: Wheezing present. No rales.     Comments: Decreased air entry to both lungs. Central congestion. Diffused expiratory wheezes R+L lung. Needs O2 via [place] to maintain SatO2>90% Chest:     Chest wall: No tenderness.  Abdominal:     General: Bowel sounds are normal.     Palpations: Abdomen is soft.     Tenderness: There is no abdominal tenderness.  Musculoskeletal:     Cervical back: Normal range of motion and neck supple.     Right lower leg: Edema present.     Left lower leg: Edema present.     Comments: Trace edema BLE  Skin:    General: Skin is warm and dry.  Neurological:     General: No focal deficit present.     Mental Status: She is alert. Mental status is at baseline.     Motor: No weakness.     Coordination: Coordination normal.     Gait: Gait abnormal.     Comments: Oriented to person, place.   Psychiatric:        Mood and Affect: Mood normal.     Comments: Generalized weakness, less interactive.      Labs reviewed: Recent Labs    03/18/21 1039 03/20/21 0456 03/24/21 0000 03/26/21 0000 04/09/21 0000 06/12/21 0000  NA 136 135   < > 140 135* 137  K 3.9 3.2*   < > 4.0 4.3 4.4  CL 101 106   < > 107 103 104  CO2 26 22   < > 27* 25* 25*  GLUCOSE 143* 96  --   --   --   --   BUN 18 20   < > 13 13 16   CREATININE 0.99 0.70   < > 0.8 1.0 1.2*  CALCIUM 9.7 8.1*   < > 8.9 8.7 8.8   < > = values in this interval not displayed.   Recent Labs    03/18/21 1039 04/09/21 0000 06/12/21 0000  AST 22 13 16   ALT 15 8 10   ALKPHOS 87 80 82  BILITOT 0.9  --   --   PROT 7.7  --   --   ALBUMIN 4.5 3.8  4.0   Recent Labs    03/18/21 1039 03/20/21 0456 03/26/21 0000 04/09/21 0000 06/12/21 0000  WBC 7.2 7.8 5.5 5.2 3.8  NEUTROABS 6.2 6.0 3,641.00 4,113.00 2,466.00  HGB 13.7 11.1* 10.7* 11.5* 11.8*  HCT 41.3 34.1* 32* 35* 36  MCV 92.2 93.7  --   --   --   PLT 204 145* 207 198 180   Lab Results  Component Value Date   TSH 0.661 03/19/2021   Lab Results  Component Value Date   HGBA1C 6.0 (H) 03/19/2021   Lab Results  Component Value Date   CHOL 192 03/19/2021   HDL 69 03/19/2021   LDLCALC 107 (H) 03/19/2021   LDLDIRECT 114.7 08/18/2011   TRIG 78 03/19/2021   CHOLHDL 2.8 03/19/2021    Significant Diagnostic Results in last 30 days:  No results found.  Assessment/Plan COPD (chronic obstructive pulmonary disease) with chronic bronchitis persisted coughing, wheezing, fever of 100.2, poor oral intake, needs O2 [place] to maintain Sat O2>90%, appears weak and less interactive in general. Denied chest pain, palpitation, no noted new focal weakness.   Will start Doxycycline 100mg  bid x 7days, increase Prednisone 20mg  qd x 7days, obtain CXR ap/lateral, CBC/diff, CMP/eGFR, UA C/S in setting of fever and c/o odorous urine.   02/12/22 wbc 8.8, Hgb 11.1, plt 207, neutrophils 72.6, Na 138, K 4.2, Bun 20, creat 0.86, UA negative for UTI. CXR 02/12/22 normal chest x-ray.   COPD, diffused mild expiratory wheezes, c/o SOB, uses O2 more than prior,  takes Symbicort, Zyrtec, Mucinex, DuoNeb, prn Albuterol, cough is chronic, on Prednisone low dose. 02/05/22 increased Prednisone to 40mg  qd x 5 days, DuoNeb bid x 5 days.      Fever Low grade, COVID test persisted coughing, wheezing, fever of 100.2, poor oral intake, needs O2 [place] to maintain Sat O2>90%, appears weak and less interactive in general. Denied chest pain, palpitation, no noted new focal weakness.   Will start Doxycycline 100mg  bid x 7days, increase Prednisone 20mg  qd x 7days, obtain CXR ap/lateral, CBC/diff, CMP/eGFR, UA C/S in setting of  fever and c/o odorous urine.   CVA (cerebral vascular accident) [facility])  CT x2 03/2021 showed no acute findings except age indeterminate right corona radiator infarct, small vessel changes. Neurology recommended no further work ups.  Macular degeneration  f/u [facility]: OU for [place] MD involving tcentral area. OS exudative age related MD  Fibromyalgia Chronic lower back, leg pain, on Flexeril qhs, Lyrica   HTN (hypertension) blood pressure is controlled on Metoprolol. Bun/creat 16/1.2 06/12/21  UTI (urinary tract infection) on  Nitrofurantoin 50mg  qd for UTI suppression, Dr. Vernie Ammons.   Atrophic vaginitis  Estrace vaginal cream for atrophic vaginitis.   ABLA (acute blood loss anemia)  Hx of Hgb 6.8 in hospital s/p 2 units of PRBC,  Vit B12, Iron, Hgb 11.8 06/12/21  GERD GERD/chronic gastritis/atrophic gastritis, GI bleed 02/2020, s/p EGD, per biopsy, takes Protonix. F/u GI prn  Slow transit constipation Stable,  takes MiraLax 2x/wk. Colace daily.      Family/ staff Communication: plan of care reviewed with the patient and charge nurse.   Labs/tests ordered:  CBC/diff, CMP/eGFR, CXR ap/lateral  Time spend 40 minutes.

## 2022-02-12 NOTE — Assessment & Plan Note (Signed)
blood pressure is controlled on Metoprolol. Bun/creat 16/1.2 06/12/21

## 2022-02-12 NOTE — Progress Notes (Deleted)
.  psc

## 2022-02-12 NOTE — Assessment & Plan Note (Signed)
on  Nitrofurantoin '50mg'$  qd for UTI suppression, Dr. Karsten Ro.

## 2022-02-12 NOTE — Assessment & Plan Note (Signed)
Estrace vaginal cream for atrophic vaginitis.

## 2022-02-12 NOTE — Assessment & Plan Note (Signed)
Chronic lower back, leg pain, on Flexeril qhs, Lyrica  

## 2022-02-12 NOTE — Assessment & Plan Note (Signed)
f/u [facility]: OU for [place] MD involving tcentral area. OS exudative age related MD

## 2022-02-19 ENCOUNTER — Encounter: Payer: Self-pay | Admitting: Nurse Practitioner

## 2022-02-19 DIAGNOSIS — R419 Unspecified symptoms and signs involving cognitive functions and awareness: Secondary | ICD-10-CM | POA: Insufficient documentation

## 2022-02-19 DIAGNOSIS — G3184 Mild cognitive impairment, so stated: Secondary | ICD-10-CM | POA: Insufficient documentation

## 2022-03-01 ENCOUNTER — Non-Acute Institutional Stay (SKILLED_NURSING_FACILITY): Payer: Medicare Other | Admitting: Nurse Practitioner

## 2022-03-01 ENCOUNTER — Encounter: Payer: Self-pay | Admitting: Nurse Practitioner

## 2022-03-01 DIAGNOSIS — Z0000 Encounter for general adult medical examination without abnormal findings: Secondary | ICD-10-CM

## 2022-03-01 NOTE — Progress Notes (Unsigned)
Provider: Arletha Grippe  Location: [place] Room Number: 917-A Place of Service:  ALF (13)   PCP: Mast, Man X, NP Patient Care Team: Mast, Man X, NP as PCP - General (Internal Medicine) Mast, Man X, NP as Nurse Practitioner (Internal Medicine)  Extended Emergency Contact Information Primary Emergency Contact: Thomason,Jeff Home Phone: [phone] Relation: Son Secondary Emergency Contact: Culbreth Sr.,Christopher Home Phone: [phone] Relation: Relative  Code Status: DNR Goals of Care: Advanced Directive information    03/01/2022   11:27 AM  Advanced Directives  Does Patient Have a Medical Advance Directive? Yes  Type of Advance Directive Living will;Out of facility DNR (pink MOST or yellow form)  Does patient want to make changes to medical advance directive? No - Patient declined  Pre-existing out of facility DNR order (yellow form or pink MOST form) Yellow form placed in chart (order not valid for inpatient use)     Chief Complaint  Patient presents with   Medicare Wellness    AWV    HPI: Patient is a 89 y.o. female seen today for an annual comprehensive examination.  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     Fibromyalgia    GERD 12/19/2006   Qualifier: Diagnosis of  By: Julien Girt CMA, Marliss Czar  04/13/16 TSH 1.20, Na 141, K 4.2, Bun 15, creat 0.79,  wbc 5.5, Hgb 11.5, plt 283    GERD (gastroesophageal reflux disease)    Hammer toe of second toe of left foot 04/08/2016   Left 2nd   Hearing loss    Hemorrhoids    Hypercholesterolemia    Hypertension    Osteoarthritis 12/19/2006   Qualifier: Diagnosis of  By: Julien Girt CMA, Leigh     Osteoporosis    Past Surgical History:  Procedure Laterality Date   BIOPSY  02/17/2020   Procedure: BIOPSY;  Surgeon: Jackquline Denmark, MD;  Location: [facility];  Service: Endoscopy;;   CATARACT EXTRACTION, BILATERAL  2009   COLONOSCOPY  2009, 2006 , 2017   ESOPHAGOGASTRODUODENOSCOPY (EGD) WITH PROPOFOL N/A 02/17/2020   Procedure: ESOPHAGOGASTRODUODENOSCOPY (EGD) WITH PROPOFOL;  Surgeon: Jackquline Denmark, MD;  Location: [facility];  Service: Endoscopy;  Laterality: N/A;   IR ANGIOGRAM SELECTIVE EACH ADDITIONAL VESSEL  02/17/2020   IR ANGIOGRAM SELECTIVE EACH ADDITIONAL VESSEL  02/17/2020   IR ANGIOGRAM SELECTIVE EACH ADDITIONAL VESSEL  02/17/2020   IR ANGIOGRAM SELECTIVE EACH ADDITIONAL VESSEL  02/17/2020   IR ANGIOGRAM SELECTIVE EACH ADDITIONAL VESSEL  02/17/2020   IR ANGIOGRAM VISCERAL SELECTIVE  02/17/2020   IR ANGIOGRAM VISCERAL SELECTIVE  02/17/2020   IR US GUIDE VASC ACCESS RIGHT  02/17/2020   stapes surgery     Dr Thornell Mule   TONSILLECTOMY AND ADENOIDECTOMY     as a child    reports  that she quit smoking about 12 years ago. Her smoking use included cigarettes. She has never used smokeless tobacco. She reports current alcohol use. She reports that she does not use drugs. Social History   Socioeconomic History   Marital status: Widowed    Spouse name: Not on file   Number of children: Not on file   Years of education: Not on file   Highest education level: Not on file  Occupational History   Occupation: retired   Occupation: works some weekends at [employer] Use   Smoking status: Former    Types: Cigarettes    Quit date: 02/15/2010    Years since quitting: 12.0   Smokeless tobacco: Never   Vaping Use   Vaping Use: Never used  Substance and Sexual Activity   Alcohol use: Yes    Comment: occasional glass of wine   Drug use: No   Sexual activity: Never  Other Topics Concern   Not on file  Social History Narrative   Pt works some weekends at [employer]   Caffeine use - daily coffee in the mornings   Patient gets regular exercise > tries to walk daily for exercise   Moved to [place] 03/11/16   Widowed   Former Smoker-stopped 2012   Alcohol occasionally glass of wine   Living Will   Social Determinants of Health   Financial Resource Strain: Low Risk  (11/08/2017)   Overall Financial Resource Strain (CARDIA)    Difficulty of Paying Living Expenses: Not hard at all  Food Insecurity: No Food Insecurity (11/08/2017)   Hunger Vital Sign    Worried About Running Out of Food in the Last Year: Never true    [place] in the Last Year: Never true  Transportation Needs: No Transportation Needs (11/08/2017)   PRAPARE - Hydrologist (Medical): No    Lack of Transportation (Non-Medical): No  Physical Activity: Sufficiently Active (11/08/2017)   Exercise Vital Sign    Days of Exercise per Week: 5 days    Minutes of Exercise per Session: 30 min  Stress: No Stress Concern Present (11/08/2017)   [place]    Feeling of Stress : Not at all  Social Connections: Moderately Isolated (11/08/2017)   Social Connection and Isolation Panel [NHANES]    Frequency of Communication with Friends and Family: More than three times a week    Frequency of Social Gatherings with Friends and Family: Twice a week    Attends Religious Services: Never    Marine scientist or Organizations: No    Attends Archivist Meetings: Never    Marital Status: Widowed   Family History  Problem Relation Age of Onset   Heart disease Father    COPD Father    Hypertension Mother     Arthritis Mother     Pertinent  Health Maintenance Due  Topic Date Due   DEXA SCAN  Completed   INFLUENZA VACCINE  Discontinued      03/19/2021    3:00 AM 03/19/2021    8:00 AM 03/20/2021   12:00 AM 03/20/2021   12:00 PM 02/05/2022    9:16 AM  Fall Risk  Falls in the past year?     0  Was there an injury with Fall?     0  Fall Risk Category Calculator     0  Fall Risk Category (  Retired)     Low  (RETIRED) Patient Fall Risk Level High fall risk High fall risk High fall risk High fall risk High fall risk  Patient at Risk for Falls Due to     History of fall(s)  Fall risk Follow up     Falls evaluation completed      02/05/2022    9:16 AM 11/08/2017   12:01 PM 11/05/2016    9:54 AM 09/01/2015    9:27 AM 08/31/2012   10:00 AM  Depression screen PHQ 2/9  Decreased Interest 0 0 0 0 0  Down, Depressed, Hopeless 0 0 0 0 0  PHQ - 2 Score 0 0 0 0 0    Functional Status Survey:    Allergies  Allergen Reactions   Penicillins Anaphylaxis   Bisphosphonates Other (See Comments)    [name]   Influenza Vaccines     Unknown   Simvastatin     Unknown    Trazodone And Nefazodone     Felt her throat was closing up/kept her awake   Sulfonamide Derivatives Rash and Other (See Comments)    blisters    Outpatient Encounter Medications as of 03/01/2022  Medication Sig   acetaminophen (TYLENOL) 325 MG tablet Take 650 mg by mouth every 4 (four) hours as needed for fever.   albuterol (VENTOLIN HFA) 108 (90 Base) MCG/ACT inhaler Inhale into the lungs every 6 (six) hours as needed for wheezing or shortness of breath.   benzonatate (TESSALON) 100 MG capsule Take 100 mg by mouth 2 (two) times daily.   budesonide-formoterol (SYMBICORT) 80-4.5 MCG/ACT inhaler Inhale 1 puff into the lungs daily.   calcium carbonate (TUMS EX) 750 MG chewable tablet Chew 750 mg by mouth daily.    cholecalciferol (VITAMIN D) 1000 UNITS tablet Take 1,000 Units by mouth daily.   cyclobenzaprine (FLEXERIL) 5 MG  tablet Take 2.5 mg by mouth at bedtime.   docusate sodium (COLACE) 100 MG capsule Take 100 mg by mouth 2 (two) times daily.   ferrous sulfate 325 (65 FE) MG tablet Take 325 mg by mouth. Once A Day on Mon, Wed, Fri   hydrocortisone cream 1 % Apply 1 application topically 2 (two) times daily as needed (wrists).   hydrocortisone valerate cream (WESTCORT) 0.2 % Apply 1 application topically 2 (two) times daily as needed (for itching). Cleanse behind ears with gentle cleanser and dry completely.   ipratropium-albuterol (DUONEB) 0.5-2.5 (3) MG/3ML SOLN Take 3 mLs by nebulization daily as needed.   metoprolol tartrate (LOPRESSOR) 25 MG tablet Take 12.5 mg by mouth 2 (two) times daily.   nitrofurantoin (MACRODANTIN) 50 MG capsule Take 50 mg by mouth at bedtime.   Olopatadine HCl 0.2 % SOLN Place 1 drop into both eyes daily.   polyethylene glycol (MIRALAX / GLYCOLAX) 17 g packet Take 17 g by mouth 2 (two) times a week. Sunday and Wednesday   predniSONE (DELTASONE) 5 MG tablet Take 5 mg by mouth daily.   Propylene Glycol 0.6 % SOLN Place 1 drop into both eyes 4 (four) times daily.   traMADol (ULTRAM) 50 MG tablet Take 0.5 tablets (25 mg total) by mouth every 8 (eight) hours as needed. (Patient taking differently: Take 25 mg by mouth every 6 (six) hours as needed.)   vitamin B-12 (CYANOCOBALAMIN) 1000 MCG tablet Take 1,000 mcg by mouth daily.   ipratropium-albuterol (DUONEB) 0.5-2.5 (3) MG/3ML SOLN Take 3 mLs by nebulization 2 (two) times daily for 5 days.   ketoconazole (  NIZORAL) 2 % cream Apply 1 application topically 2 (two) times daily as needed for irritation. (Patient not taking: Reported on 03/01/2022)   pregabalin (LYRICA) 75 MG capsule Take 1 capsule (75 mg total) by mouth at bedtime. (Patient not taking: Reported on 03/01/2022)   No facility-administered encounter medications on file as of 03/01/2022.    Review of Systems  Vitals:   03/01/22 1126  BP: 130/70  Pulse: 66  Resp: (!) 22  Temp:  97.9 F (36.6 C)  SpO2: 93%  Weight: 151 lb 6.4 oz (68.7 kg)  Height: '5\' 1"'$  (1.549 m)   Body mass index is 28.61 kg/m. Physical Exam  Labs reviewed: Basic Metabolic Panel: Recent Labs    03/18/21 1039 03/20/21 0456 03/24/21 0000 03/26/21 0000 04/09/21 0000 06/12/21 0000  NA 136 135   < > 140 135* 137  K 3.9 3.2*   < > 4.0 4.3 4.4  CL 101 106   < > 107 103 104  CO2 26 22   < > 27* 25* 25*  GLUCOSE 143* 96  --   --   --   --   BUN 18 20   < > '13 13 16  '$ CREATININE 0.99 0.70   < > 0.8 1.0 1.2*  CALCIUM 9.7 8.1*   < > 8.9 8.7 8.8   < > = values in this interval not displayed.   Liver Function Tests: Recent Labs    03/18/21 1039 04/09/21 0000 06/12/21 0000  AST '22 13 16  '$ ALT '15 8 10  '$ ALKPHOS 87 80 82  BILITOT 0.9  --   --   PROT 7.7  --   --   ALBUMIN 4.5 3.8 4.0   No results for input(s): "LIPASE", "AMYLASE" in the last 8760 hours. Recent Labs    03/18/21 1856  AMMONIA 30   CBC: Recent Labs    03/18/21 1039 03/20/21 0456 03/26/21 0000 04/09/21 0000 06/12/21 0000  WBC 7.2 7.8 5.5 5.2 3.8  NEUTROABS 6.2 6.0 3,641.00 4,113.00 2,466.00  HGB 13.7 11.1* 10.7* 11.5* 11.8*  HCT 41.3 34.1* 32* 35* 36  MCV 92.2 93.7  --   --   --   PLT 204 145* 207 198 180   Cardiac Enzymes: No results for input(s): "CKTOTAL", "CKMB", "CKMBINDEX", "TROPONINI" in the last 8760 hours. BNP: Invalid input(s): "POCBNP" Lab Results  Component Value Date   HGBA1C 6.0 (H) 03/19/2021   Lab Results  Component Value Date   TSH 0.661 03/19/2021   Lab Results  Component Value Date   VITAMINB12 1,441 (H) 03/19/2021   No results found for: "FOLATE" Lab Results  Component Value Date   IRON 59 01/10/2019   TIBC 281 01/10/2019   FERRITIN 46 01/10/2019    Imaging and Procedures obtained recently: No results found.  Assessment/Plan There are no diagnoses linked to this encounter.   Family/ staff Communication:   Labs/tests ordered:

## 2022-03-02 ENCOUNTER — Encounter: Payer: Self-pay | Admitting: Nurse Practitioner

## 2022-03-02 NOTE — Progress Notes (Signed)
Subjective:   Lisa King is a 89 y.o. female who presents for Medicare Annual (Subsequent) preventive examination @ [place].      Objective:    Today's Vitals   03/01/22 1126 03/02/22 1259  BP: 130/70   Pulse: 66   Resp: (!) 22   Temp: 97.9 F (36.6 C)   SpO2: 93%   Weight: 151 lb 6.4 oz (68.7 kg)   Height: '5\' 1"'$  (1.549 m)   PainSc:  2    Body mass index is 28.61 kg/m.     03/01/2022   11:27 AM 02/05/2022    9:15 AM 01/27/2022   10:59 AM 06/18/2021    3:41 PM 06/17/2021   12:27 PM 06/12/2021   11:57 AM 05/13/2021    1:36 PM  Advanced Directives  Does Patient Have a Medical Advance Directive? Yes Yes Yes Yes Yes Yes Yes  Type of Advance Directive Living will;Out of facility DNR (pink MOST or yellow form) Living will;Out of facility DNR (pink MOST or yellow form) Living will;Out of facility DNR (pink MOST or yellow form) Out of facility DNR (pink MOST or yellow form);Living will Out of facility DNR (pink MOST or yellow form);Living will Out of facility DNR (pink MOST or yellow form) Out of facility DNR (pink MOST or yellow form);Living will  Does patient want to make changes to medical advance directive? No - Patient declined No - Patient declined No - Patient declined No - Patient declined No - Patient declined No - Patient declined No - Patient declined  Copy of [place] in Chart?      Yes - validated most recent copy scanned in chart (See row information)   Pre-existing out of facility DNR order (yellow form or pink MOST form) Yellow form placed in chart (order not valid for inpatient use) Yellow form placed in chart (order not valid for inpatient use) Yellow form placed in chart (order not valid for inpatient use)        Current Medications (verified) Outpatient Encounter Medications as of 03/01/2022  Medication Sig   acetaminophen (TYLENOL) 325 MG tablet Take 650 mg by mouth every 4 (four) hours as needed for fever.   albuterol  (VENTOLIN HFA) 108 (90 Base) MCG/ACT inhaler Inhale into the lungs every 6 (six) hours as needed for wheezing or shortness of breath.   benzonatate (TESSALON) 100 MG capsule Take 100 mg by mouth 2 (two) times daily.   budesonide-formoterol (SYMBICORT) 80-4.5 MCG/ACT inhaler Inhale 1 puff into the lungs daily.   calcium carbonate (TUMS EX) 750 MG chewable tablet Chew 750 mg by mouth daily.    cholecalciferol (VITAMIN D) 1000 UNITS tablet Take 1,000 Units by mouth daily.   cyclobenzaprine (FLEXERIL) 5 MG tablet Take 2.5 mg by mouth at bedtime.   docusate sodium (COLACE) 100 MG capsule Take 100 mg by mouth 2 (two) times daily.   ferrous sulfate 325 (65 FE) MG tablet Take 325 mg by mouth. Once A Day on Mon, Wed, Fri   hydrocortisone cream 1 % Apply 1 application topically 2 (two) times daily as needed (wrists).   hydrocortisone valerate cream (WESTCORT) 0.2 % Apply 1 application topically 2 (two) times daily as needed (for itching). Cleanse behind ears with gentle cleanser and dry completely.   ipratropium-albuterol (DUONEB) 0.5-2.5 (3) MG/3ML SOLN Take 3 mLs by nebulization daily as needed.   metoprolol tartrate (LOPRESSOR) 25 MG tablet Take 12.5 mg by mouth 2 (two) times daily.  nitrofurantoin (MACRODANTIN) 50 MG capsule Take 50 mg by mouth at bedtime.   Olopatadine HCl 0.2 % SOLN Place 1 drop into both eyes daily.   polyethylene glycol (MIRALAX / GLYCOLAX) 17 g packet Take 17 g by mouth 2 (two) times a week. Sunday and Wednesday   predniSONE (DELTASONE) 5 MG tablet Take 5 mg by mouth daily.   Propylene Glycol 0.6 % SOLN Place 1 drop into both eyes 4 (four) times daily.   traMADol (ULTRAM) 50 MG tablet Take 0.5 tablets (25 mg total) by mouth every 8 (eight) hours as needed. (Patient taking differently: Take 25 mg by mouth every 6 (six) hours as needed.)   vitamin B-12 (CYANOCOBALAMIN) 1000 MCG tablet Take 1,000 mcg by mouth daily.   ipratropium-albuterol (DUONEB) 0.5-2.5 (3) MG/3ML SOLN Take 3 mLs  by nebulization 2 (two) times daily for 5 days.   ketoconazole (NIZORAL) 2 % cream Apply 1 application topically 2 (two) times daily as needed for irritation. (Patient not taking: Reported on 03/01/2022)   pregabalin (LYRICA) 75 MG capsule Take 1 capsule (75 mg total) by mouth at bedtime. (Patient not taking: Reported on 03/01/2022)   No facility-administered encounter medications on file as of 03/01/2022.    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     Fibromyalgia    GERD 12/19/2006   Qualifier: Diagnosis of  By: Julien Girt CMA, Marliss Czar  04/13/16 TSH 1.20, Na 141, K 4.2, Bun 15, creat 0.79, wbc 5.5, Hgb 11.5, plt 283    GERD (gastroesophageal reflux disease)    Hammer toe of second toe of left foot 04/08/2016   Left 2nd   Hearing loss    Hemorrhoids    Hypercholesterolemia    Hypertension    Osteoarthritis 12/19/2006   Qualifier: Diagnosis of  By: Julien Girt CMA, Leigh     Osteoporosis    Past Surgical History:  Procedure Laterality Date   BIOPSY  02/17/2020   Procedure: BIOPSY;  Surgeon: Jackquline Denmark, MD;  Location: [facility];  Service: Endoscopy;;   CATARACT EXTRACTION, BILATERAL  2009   COLONOSCOPY  2009, 2006 , 2017   ESOPHAGOGASTRODUODENOSCOPY  (EGD) WITH PROPOFOL N/A 02/17/2020   Procedure: ESOPHAGOGASTRODUODENOSCOPY (EGD) WITH PROPOFOL;  Surgeon: Jackquline Denmark, MD;  Location: [facility];  Service: Endoscopy;  Laterality: N/A;   IR ANGIOGRAM SELECTIVE EACH ADDITIONAL VESSEL  02/17/2020   IR ANGIOGRAM SELECTIVE EACH ADDITIONAL VESSEL  02/17/2020   IR ANGIOGRAM SELECTIVE EACH ADDITIONAL VESSEL  02/17/2020   IR ANGIOGRAM SELECTIVE EACH ADDITIONAL VESSEL  02/17/2020   IR ANGIOGRAM SELECTIVE EACH ADDITIONAL VESSEL  02/17/2020   IR ANGIOGRAM VISCERAL SELECTIVE  02/17/2020   IR ANGIOGRAM VISCERAL SELECTIVE  02/17/2020  IR US GUIDE VASC ACCESS RIGHT  02/17/2020   stapes surgery     Dr Thornell Mule   TONSILLECTOMY AND ADENOIDECTOMY     as a child   Family History  Problem Relation Age of Onset   Heart disease Father    COPD Father    Hypertension Mother    Arthritis Mother    Social History   Socioeconomic History   Marital status: Widowed    Spouse name: Not on file   Number of children: Not on file   Years of education: Not on file   Highest education level: Not on file  Occupational History   Occupation: retired   Occupation: works some weekends at [employer]   Smoking status: Former    Types: Cigarettes    Quit date: 02/15/2010    Years since quitting: 12.0   Smokeless tobacco: Never  Vaping Use   Vaping Use: Never used  Substance and Sexual Activity   Alcohol use: Yes    Comment: occasional glass of wine   Drug use: No   Sexual activity: Never  Other Topics Concern   Not on file  Social History Narrative   Pt works some weekends at [employer]   Caffeine use - daily coffee in the mornings   Patient gets regular exercise > tries to walk daily for exercise   Moved to [place] 03/11/16   Widowed   Former Smoker-stopped 2012   Alcohol occasionally glass of wine   Living Will   Social Determinants of Health   Financial Resource Strain: Low Risk  (11/08/2017)   Overall Financial Resource  Strain (CARDIA)    Difficulty of Paying Living Expenses: Not hard at all  Food Insecurity: No Food Insecurity (11/08/2017)   Hunger Vital Sign    Worried About Running Out of Food in the Last Year: Never true    [place] in the Last Year: Never true  Transportation Needs: No Transportation Needs (11/08/2017)   PRAPARE - Hydrologist (Medical): No    Lack of Transportation (Non-Medical): No  Physical Activity: Sufficiently Active (11/08/2017)   Exercise Vital Sign    Days of Exercise per Week: 5 days    Minutes of Exercise per Session: 30 min  Stress: No Stress Concern Present (11/08/2017)   [place]    Feeling of Stress : Not at all  Social Connections: Moderately Isolated (11/08/2017)   Social Connection and Isolation Panel [NHANES]    Frequency of Communication with Friends and Family: More than three times a week    Frequency of Social Gatherings with Friends and Family: Twice a week    Attends Religious Services: Never    Marine scientist or Organizations: No    Attends Archivist Meetings: Never    Marital Status: Widowed    Tobacco Counseling Counseling given: Not Answered   Clinical Intake:  Pre-visit preparation completed: Yes  Pain : 0-10 Pain Score: 2  Pain Type: Chronic pain, Neuropathic pain Pain Location: Back Pain Orientation: Mid Pain Descriptors / Indicators: Aching, Discomfort, Dull Pain Onset: More than a month ago Pain Relieving Factors: medication, rest Effect of Pain on Daily Activities: less active  Pain Relieving Factors: medication, rest  BMI - recorded: 28.61 Nutritional Status: BMI 25 -29 Overweight Nutritional Risks: None Diabetes: No  How often do you need to have someone help you  when you read instructions, pamphlets, or other written materials from your doctor or pharmacy?: 3 - Sometimes What is the last grade level you  completed in school?: high school  Diabetic?ni  Interpreter Needed?: No  Information entered by :: Keylani Perlstein Bretta Bang NP   Activities of Daily Living    03/02/2022    1:02 PM 03/20/2021    1:00 AM  In your present state of health, do you have any difficulty performing the following activities:  Hearing? 1 1  Comment hearing aids   Vision? 1 0  Difficulty concentrating or making decisions? 1 1  Walking or climbing stairs? 1 1  Dressing or bathing? 1 1  Doing errands, shopping? 1 1  Preparing Food and eating ? N   Using the Toilet? N   In the past six months, have you accidently leaked urine? Y   Do you have problems with loss of bowel control? N   Managing your Medications? Y   Managing your Finances? Y     Patient Care Team: Brexton Sofia X, NP as PCP - General (Internal Medicine) Helix Lafontaine X, NP as Nurse Practitioner (Internal Medicine)  Indicate any recent Medical Services you may have received from other than [facility] providers in the past year (date may be approximate).     Assessment:   This is a routine wellness examination for [place].  Hearing/Vision screen No results found.  Dietary issues and exercise activities discussed: Current Exercise Habits: The patient does not participate in regular exercise at present, Exercise limited by: neurologic condition(s);orthopedic condition(s);respiratory conditions(s)   Goals Addressed             This Visit's Progress    Maintain Mobility and Function       Evidence-based guidance:  Acknowledge and validate impact of pain, loss of strength and potential disfigurement (hand osteoarthritis) on mental health and daily life, such as social isolation, anxiety, depression, impaired sexual relationship and   injury from falls.  Anticipate referral to physical or occupational therapy for assessment, therapeutic exercise and recommendation for adaptive equipment or assistive devices; encourage participation.  Assess impact on ability to  perform activities of daily living, as well as engage in sports and leisure events or requirements of work or school.  Provide anticipatory guidance and reassurance about the benefit of exercise to maintain function; acknowledge and normalize fear that exercise may worsen symptoms.  Encourage regular exercise, at least 10 minutes at a time for 45 minutes per week; consider yoga, water exercise and proprioceptive exercises; encourage use of wearable activity tracker to increase motivation and adherence.  Encourage maintenance or resumption of daily activities, including employment, as pain allows and with minimal exposure to trauma.  Assist patient to advocate for adaptations to the work environment.  Consider level of pain and function, gender, age, lifestyle, patient preference, quality of life, readiness and ?ocapacity to benefit? when recommending patients for orthopaedic surgery consultation.  Explore strategies, such as changes to medication regimen or activity that enables patient to anticipate and manage flare-ups that increase deconditioning and disability.  Explore patient preferences; encourage exposure to a broader range of activities that have been avoided for fear of experiencing pain.  Identify barriers to participation in therapy or exercise, such as pain with activity, anticipated or imagined pain.  Monitor postoperative joint replacement or any preexisting joint replacement for ongoing pain and loss of function; provide social support and encouragement throughout recovery.   Notes:        Depression  Screen    02/05/2022    9:16 AM 11/08/2017   12:01 PM 11/05/2016    9:54 AM 09/01/2015    9:27 AM 08/31/2012   10:00 AM  PHQ 2/9 Scores  PHQ - 2 Score 0 0 0 0 0    Fall Risk    02/05/2022    9:16 AM 11/08/2017   12:01 PM 11/05/2016    9:54 AM 09/01/2015    9:27 AM 08/31/2012   10:00 AM  Fall Risk   Falls in the past year? 0 No Yes Yes No  Number falls in past yr: 0  2 or  more 1   Injury with Fall? 0  Yes No   Risk for fall due to : History of fall(s)   Impaired balance/gait   Follow up Falls evaluation completed        FALL RISK PREVENTION PERTAINING TO THE HOME:  Any stairs in or around the home? Yes  If so, are there any without handrails? No  Home free of loose throw rugs in walkways, pet beds, electrical cords, etc? Yes  Adequate lighting in your home to reduce risk of falls? Yes   ASSISTIVE DEVICES UTILIZED TO PREVENT FALLS:  Life alert? No  Use of a cane, walker or w/c? Yes  Grab bars in the bathroom? Yes  Shower chair or bench in shower? Yes  Elevated toilet seat or a handicapped toilet? Yes   TIMED UP AND GO:  Was the test performed? Yes .  Length of time to ambulate 10 feet: 10 sec.   Gait slow and steady with assistive device  Cognitive Function:    02/12/2021   10:24 AM 12/29/2017    8:10 AM 11/08/2017   12:03 PM 11/05/2016   11:02 AM 05/12/2016   11:50 AM  MMSE - Mini Mental State Exam  Orientation to time '5 5 5 5 4  '$ Orientation to Place '5 5 5 5 5  '$ Registration '3 3 3 3 3  '$ Attention/ Calculation '5 5 5 5 5  '$ Recall 3 0 '1 2 1  '$ Language- name 2 objects '2 2 2 2 2  '$ Language- repeat '1 1 1 1 1  '$ Language- follow 3 step command '3 3 3 3 3  '$ Language- read & follow direction '1 1 1 1 1  '$ Write a sentence '1 1 1 1 1  '$ Copy design 0 '1 1 1 1  '$ Total score '29 27 28 29 27        '$ Immunizations Immunization History  Administered Date(s) Administered   Moderna SARS-COV2 Booster Vaccination 12/25/2019   Moderna Sars-Covid-2 Vaccination 02/17/2019, 03/17/2019   Pneumococcal Conjugate-13 09/01/2015   Pneumococcal Polysaccharide-23 07/07/1998   Td 08/21/2009    TDAP status: Due, Education has been provided regarding the importance of this vaccine. Advised may receive this vaccine at local pharmacy or Health Dept. Aware to provide a copy of the vaccination record if obtained from local pharmacy or Health Dept. Verbalized acceptance and  understanding.  Flu Vaccine status: Up to date  Pneumococcal vaccine status: Due, Education has been provided regarding the importance of this vaccine. Advised may receive this vaccine at local pharmacy or Health Dept. Aware to provide a copy of the vaccination record if obtained from local pharmacy or Health Dept. Verbalized acceptance and understanding.  Covid-19 vaccine status: Information provided on how to obtain vaccines.   Qualifies for Shingles Vaccine? Yes   Zostavax completed Yes   Shingrix Completed?: No.    Education has been  provided regarding the importance of this vaccine. Patient has been advised to call insurance company to determine out of pocket expense if they have not yet received this vaccine. Advised may also receive vaccine at local pharmacy or Health Dept. Verbalized acceptance and understanding.  Screening Tests Health Maintenance  Topic Date Due   Zoster Vaccines- Shingrix (1 of 2) Never done   DTaP/Tdap/Td (2 - Tdap) 08/22/2019   Pneumonia Vaccine 67+ Years old (3 - PPSV23 or PCV20) 08/31/2020   COVID-19 Vaccine (4 - 2023-24 season) 10/16/2021   Medicare Annual Wellness (AWV)  03/02/2023   DEXA SCAN  Completed   HPV VACCINES  Aged Out   INFLUENZA VACCINE  Discontinued    Health Maintenance  Health Maintenance Due  Topic Date Due   Zoster Vaccines- Shingrix (1 of 2) Never done   DTaP/Tdap/Td (2 - Tdap) 08/22/2019   Pneumonia Vaccine 48+ Years old (3 - PPSV23 or PCV20) 08/31/2020   COVID-19 Vaccine (4 - 2023-24 season) 10/16/2021    Colorectal cancer screening: No longer required.   Mammogram status: No longer required due to aged out.  Bone Density status: Completed 12/03/2016. Results reflect: Bone density results: OSTEOPOROSIS. Repeat every 2 years.  Lung Cancer Screening: (Low Dose CT Chest recommended if Age 109-80 years, 30 pack-year currently smoking OR have quit w/in 15years.) does not qualify.   Additional Screening:  Hepatitis C  Screening: does not qualify;   Vision Screening: Recommended annual ophthalmology exams for early detection of glaucoma and other disorders of the eye. Is the patient up to date with their annual eye exam?  Yes  Who is the provider or what is the name of the office in which the patient attends annual eye exams? [facility] If pt is not established with a provider, would they like to be referred to a provider to establish care? No .   Dental Screening: Recommended annual dental exams for proper oral hygiene  Community Resource Referral / Chronic Care Management: CRR required this visit?  No   CCM required this visit?  No      Plan:    Due PNA 20, Shingrix, Tdap, DEXA I have personally reviewed and noted the following in the patient's chart:   Medical and social history Use of alcohol, tobacco or illicit drugs  Current medications and supplements including opioid prescriptions. Patient is not currently taking opioid prescriptions. Functional ability and status Nutritional status Physical activity Advanced directives List of other physicians Hospitalizations, surgeries, and ER visits in previous 12 months Vitals Screenings to include cognitive, depression, and falls Referrals and appointments  In addition, I have reviewed and discussed with patient certain preventive protocols, quality metrics, and best practice recommendations. A written personalized care plan for preventive services as well as general preventive health recommendations were provided to patient.     Jimeka Balan X Elena Davia, NP   03/02/2022

## 2022-03-09 ENCOUNTER — Non-Acute Institutional Stay: Payer: Medicare Other | Admitting: Nurse Practitioner

## 2022-03-09 ENCOUNTER — Encounter: Payer: Self-pay | Admitting: Nurse Practitioner

## 2022-03-09 DIAGNOSIS — H353130 Nonexudative age-related macular degeneration, bilateral, stage unspecified: Secondary | ICD-10-CM | POA: Diagnosis not present

## 2022-03-09 DIAGNOSIS — D649 Anemia, unspecified: Secondary | ICD-10-CM | POA: Diagnosis not present

## 2022-03-09 DIAGNOSIS — I10 Essential (primary) hypertension: Secondary | ICD-10-CM

## 2022-03-09 DIAGNOSIS — J4489 Other specified chronic obstructive pulmonary disease: Secondary | ICD-10-CM | POA: Diagnosis not present

## 2022-03-09 DIAGNOSIS — K219 Gastro-esophageal reflux disease without esophagitis: Secondary | ICD-10-CM | POA: Diagnosis not present

## 2022-03-09 DIAGNOSIS — K5901 Slow transit constipation: Secondary | ICD-10-CM

## 2022-03-09 DIAGNOSIS — R609 Edema, unspecified: Secondary | ICD-10-CM

## 2022-03-09 DIAGNOSIS — I63411 Cerebral infarction due to embolism of right middle cerebral artery: Secondary | ICD-10-CM | POA: Diagnosis not present

## 2022-03-09 DIAGNOSIS — N952 Postmenopausal atrophic vaginitis: Secondary | ICD-10-CM

## 2022-03-09 DIAGNOSIS — R600 Localized edema: Secondary | ICD-10-CM

## 2022-03-09 DIAGNOSIS — M797 Fibromyalgia: Secondary | ICD-10-CM

## 2022-03-09 NOTE — Assessment & Plan Note (Signed)
Stable,  takes MiraLax 2x/wk. Colace daily.

## 2022-03-09 NOTE — Assessment & Plan Note (Signed)
Hx of CVA, CT x2 03/2021 showed no acute findings except age indeterminate right corona radiator infarct, small vessel changes. Neurology recommended no further work ups. 

## 2022-03-09 NOTE — Assessment & Plan Note (Signed)
intermittent chronic mild expiratory wheezes, c/o SOB, uses O2 more than prior,  takes Symbicort, Zyrtec, Mucinex, DuoNeb, prn Albuterol, cough is chronic, on Prednisone low dose. treated with higher dose of Prednisone for symptomatic control.  

## 2022-03-09 NOTE — Assessment & Plan Note (Addendum)
blood pressure is controlled on Metoprolol. Bun/creat 20/0.86 02/12/22

## 2022-03-09 NOTE — Assessment & Plan Note (Signed)
f/u [facility]: OU for [place] MD involving tcentral area. OS exudative age related MD

## 2022-03-09 NOTE — Assessment & Plan Note (Addendum)
New, weight gained about #5Ibs in the past 3 months, moist rales bibasilar present, will adding Furosemide '20mg'$ , Kcl 71mq qd, BMP, BNP 1 week, obtain echocardiogram. 03/19/21 Echo EF >75% 03/12/22 Echo EF 70%

## 2022-03-09 NOTE — Assessment & Plan Note (Signed)
Urinary symptoms, on  Nitrofurantoin '50mg'$  qd for UTI suppression, Dr. Karsten Ro.              Estrace vaginal cream for atrophic vaginitis.

## 2022-03-09 NOTE — Assessment & Plan Note (Addendum)
Hx of Hgb 6.8 in hospital s/p 2 units of PRBC,  Vit B12, Iron, Hgb 11.1 02/12/22

## 2022-03-09 NOTE — Assessment & Plan Note (Signed)
takes Lyrica, chronic intermittent aches/pains

## 2022-03-09 NOTE — Progress Notes (Addendum)
Location:  [facility] Room Number: AL/917/A Place of Service:  ALF (13) Provider: Mattilyn Crites X, NP  Patient Care Team: Brizeida Mcmurry X, NP as PCP - General (Internal Medicine) Garret Teale X, NP as Nurse Practitioner (Internal Medicine)  Extended Emergency Contact Information Primary Emergency Contact: Rountree,Jeff Home Phone: [phone] Relation: Son Secondary Emergency Contact: Culbreth Sr.,Christopher Home Phone: [phone] Relation: Relative  Code Status:  DNR Goals of care: Advanced Directive information    03/09/2022   10:01 AM  Advanced Directives  Does Patient Have a Medical Advance Directive? Yes  Type of Advance Directive Living will;Out of facility DNR (pink MOST or yellow form)  Does patient want to make changes to medical advance directive? No - Patient declined  Pre-existing out of facility DNR order (yellow form or pink MOST form) Yellow form placed in chart (order not valid for inpatient use)     Chief Complaint  Patient presents with   Medical Management of Chronic Issues    Patient is here for a follow up for chronic conditions     HPI:  Pt is a 89 y.o. female seen today for medical management of chronic diseases.    Edema BLE, on Furosemide, 03/12/22 Echo EF 70%   COPD, intermittent chronic mild expiratory wheezes, c/o SOB, uses O2 more than prior,  takes Symbicort, Zyrtec, Mucinex, DuoNeb, prn Albuterol, cough is chronic, on Prednisone low dose. treated with higher dose of Prednisone for symptomatic control.  Hx of CVA, CT x2 03/2021 showed no acute findings except age indeterminate right corona radiator infarct, small vessel changes. Neurology recommended no further work ups.              Macular degeneration, f/u [facility]: OU for [place] MD involving tcentral area. 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 Bun/creat  20/0.86 02/12/22             Urinary symptoms, on  Nitrofurantoin '50mg'$  qd for UTI suppression, Dr. Karsten Ro.              Estrace vaginal cream for atrophic vaginitis.              Anemia, Hx of Hgb 6.8 in hospital s/p 2 units of PRBC,  Vit B12, Iron, Hgb 11.1 02/12/22             GERD/chronic gastritis/atrophic gastritis, GI bleed 02/2020, s/p EGD, per biopsy, takes Protonix. F/u GI prn Fibromyalgia takes Lyrica             Tactile hallucinations, off meds. TSH 0.661 03/19/21             Constipation, takes MiraLax 2x/wk. Colace daily.              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     Fibromyalgia    GERD 12/19/2006   Qualifier: Diagnosis of  By: Julien Girt CMA, Marliss Czar  04/13/16 TSH 1.20, Na 141, K 4.2, Bun 15, creat 0.79, wbc 5.5, Hgb 11.5, plt 283    GERD (gastroesophageal reflux disease)    Hammer toe of second toe of left foot 04/08/2016   Left 2nd   Hearing loss    Hemorrhoids    Hypercholesterolemia    Hypertension    Osteoarthritis 12/19/2006   Qualifier: Diagnosis of  By: Julien Girt CMA, Leigh     Osteoporosis    Past Surgical History:  Procedure Laterality Date   BIOPSY  02/17/2020   Procedure: BIOPSY;  Surgeon: Jackquline Denmark, MD;  Location: [facility];  Service: Endoscopy;;   CATARACT EXTRACTION, BILATERAL  2009    COLONOSCOPY  2009, 2006 , 2017   ESOPHAGOGASTRODUODENOSCOPY (EGD) WITH PROPOFOL N/A 02/17/2020   Procedure: ESOPHAGOGASTRODUODENOSCOPY (EGD) WITH PROPOFOL;  Surgeon: Jackquline Denmark, MD;  Location: [facility];  Service: Endoscopy;  Laterality: N/A;   IR ANGIOGRAM SELECTIVE EACH ADDITIONAL VESSEL  02/17/2020   IR ANGIOGRAM SELECTIVE EACH ADDITIONAL VESSEL  02/17/2020   IR ANGIOGRAM SELECTIVE EACH ADDITIONAL VESSEL  02/17/2020   IR ANGIOGRAM SELECTIVE EACH ADDITIONAL VESSEL  02/17/2020   IR ANGIOGRAM SELECTIVE EACH ADDITIONAL VESSEL  02/17/2020   IR ANGIOGRAM VISCERAL SELECTIVE  02/17/2020   IR ANGIOGRAM VISCERAL SELECTIVE  02/17/2020   IR US GUIDE VASC ACCESS RIGHT  02/17/2020   stapes surgery     Dr Thornell Mule   TONSILLECTOMY AND ADENOIDECTOMY     as a child    Allergies  Allergen Reactions   Penicillins Anaphylaxis   Bisphosphonates Other (See Comments)    [name]   Influenza Vaccines     Unknown   Simvastatin     Unknown    Trazodone And Nefazodone     Felt her throat was closing up/kept her awake   Sulfonamide Derivatives Rash and Other (See Comments)    blisters    Outpatient Encounter Medications as of 03/09/2022  Medication Sig   acetaminophen (TYLENOL) 325 MG tablet Take 650 mg by mouth every 4 (four) hours as needed for fever.   albuterol (VENTOLIN HFA) 108 (90 Base) MCG/ACT inhaler Inhale into the lungs every 6 (six) hours as needed for wheezing or shortness of breath.   benzonatate (TESSALON) 100 MG capsule Take 100 mg by mouth 2 (two) times daily.   budesonide-formoterol (SYMBICORT) 80-4.5 MCG/ACT inhaler Inhale 1 puff into the lungs daily.   calcium carbonate (TUMS EX) 750 MG chewable tablet Chew 750 mg by mouth daily.    cholecalciferol (VITAMIN D) 1000 UNITS tablet Take 1,000 Units by mouth daily.   cyclobenzaprine (FLEXERIL) 5 MG tablet Take 2.5 mg by mouth at bedtime.   docusate sodium (COLACE) 100 MG capsule Take 100 mg by mouth 2 (two) times daily.   ferrous sulfate 325  (65 FE) MG tablet Take 325 mg by mouth. Once A Day on Mon, Wed, Fri   hydrocortisone cream 1 % Apply 1 application topically 2 (two) times daily as needed (wrists).   hydrocortisone  valerate cream (WESTCORT) 0.2 % Apply 1 application topically 2 (two) times daily as needed (for itching). Cleanse behind ears with gentle cleanser and dry completely.   ipratropium-albuterol (DUONEB) 0.5-2.5 (3) MG/3ML SOLN Take 3 mLs by nebulization daily as needed.   ketoconazole (NIZORAL) 2 % cream Apply 1 application  topically 2 (two) times daily as needed for irritation.   metoprolol tartrate (LOPRESSOR) 25 MG tablet Take 12.5 mg by mouth 2 (two) times daily.   nitrofurantoin (MACRODANTIN) 50 MG capsule Take 50 mg by mouth at bedtime.   Olopatadine HCl 0.2 % SOLN Place 1 drop into both eyes daily.   polyethylene glycol (MIRALAX / GLYCOLAX) 17 g packet Take 17 g by mouth 2 (two) times a week. Sunday and Wednesday   predniSONE (DELTASONE) 5 MG tablet Take 5 mg by mouth daily.   pregabalin (LYRICA) 75 MG capsule Take 1 capsule (75 mg total) by mouth at bedtime.   Propylene Glycol 0.6 % SOLN Place 1 drop into both eyes 4 (four) times daily.   traMADol (ULTRAM) 50 MG tablet Take 0.5 tablets (25 mg total) by mouth every 8 (eight) hours as needed. (Patient taking differently: Take 25 mg by mouth every 6 (six) hours as needed.)   vitamin B-12 (CYANOCOBALAMIN) 1000 MCG tablet Take 1,000 mcg by mouth daily.   ipratropium-albuterol (DUONEB) 0.5-2.5 (3) MG/3ML SOLN Take 3 mLs by nebulization 2 (two) times daily for 5 days.   No facility-administered encounter medications on file as of 03/09/2022.    Review of Systems  Constitutional:  Positive for unexpected weight change. Negative for appetite change, fatigue and fever.       #5Ibs in the past 3 months.   HENT:  Positive for hearing loss. Negative for congestion and voice change.   Eyes:  Negative for visual disturbance.       C/o blurred vision, ? Color, ? Right eye  peripheral vision loss-f/u Ophthalmology  Respiratory:  Positive for cough and shortness of breath. Negative for chest tightness and wheezing.        DOE  Cardiovascular:  Positive for leg swelling. Negative for chest pain.  Gastrointestinal:  Negative for abdominal pain and constipation.  Genitourinary:  Negative for dysuria and urgency.       Chronic, on and off  Musculoskeletal:  Positive for arthralgias, back pain and gait problem. Negative for myalgias.  Skin:  Negative for color change.  Neurological:  Negative for speech difficulty, weakness and light-headedness.  Psychiatric/Behavioral:  Negative for behavioral problems, hallucinations and sleep disturbance.        Chronic going to bed late, sleeping in late. Lethargic upon my examination.     Immunization History  Administered Date(s) Administered   Moderna SARS-COV2 Booster Vaccination 12/25/2019   Moderna Sars-Covid-2 Vaccination 02/17/2019, 03/17/2019   Pneumococcal Conjugate-13 09/01/2015   Pneumococcal Polysaccharide-23 07/07/1998   Td 08/21/2009   Pertinent  Health Maintenance Due  Topic Date Due   DEXA SCAN  Completed   INFLUENZA VACCINE  Discontinued      03/19/2021    8:00 AM 03/20/2021   12:00 AM 03/20/2021   12:00 PM 02/05/2022    9:16 AM 03/09/2022   10:01 AM  Fall Risk  Falls in the past year?    0 0  Was there an injury with Fall?    0 0  Fall Risk Category Calculator    0 0  Fall Risk Category (Retired)    Low   (RETIRED) Patient Fall Risk Level High  fall risk High fall risk High fall risk High fall risk   Patient at Risk for Falls Due to    History of fall(s) No Fall Risks  Fall risk Follow up    Falls evaluation completed Falls evaluation completed   Functional Status Survey:    Vitals:   03/09/22 1003  BP: 132/70  Pulse: 65  Resp: 17  Temp: (!) 97.1 F (36.2 C)  SpO2: 93%  Weight: 151 lb 6.4 oz (68.7 kg)  Height: '5\' 1"'$  (1.549 m)   Body mass index is 28.61 kg/m. Physical Exam Vitals and  nursing note reviewed.  Constitutional:      Appearance: Normal appearance.  HENT:     Head: Normocephalic and atraumatic.     Nose: Nose normal.     Mouth/Throat:     Mouth: Mucous membranes are moist.  Eyes:     Extraocular Movements: Extraocular movements intact.     Conjunctiva/sclera: Conjunctivae normal.     Pupils: Pupils are equal, round, and reactive to light.     Comments: Able to count my fingers 3 feet away from her eyes.   Cardiovascular:     Rate and Rhythm: Normal rate and regular rhythm.     Heart sounds: No murmur heard. Pulmonary:     Effort: Pulmonary effort is normal.     Breath sounds: Rales present. No wheezing.     Comments: Decreased air entry to both lungs. Needs O2 via [place] to maintain SatO2>90%. Moist rales bibasilar.  Chest:     Chest wall: No tenderness.  Abdominal:     General: Bowel sounds are normal.     Palpations: Abdomen is soft.     Tenderness: There is no abdominal tenderness.  Musculoskeletal:     Cervical back: Normal range of motion and neck supple.     Right lower leg: Edema present.     Left lower leg: Edema present.     Comments: 2-3 +edema BLE, new  Skin:    General: Skin is warm and dry.  Neurological:     General: No focal deficit present.     Mental Status: She is alert. Mental status is at baseline.     Motor: No weakness.     Coordination: Coordination normal.     Gait: Gait abnormal.     Comments: Oriented to person, place.   Psychiatric:        Mood and Affect: Mood normal.     Comments: Generalized weakness, less interactive.      Labs reviewed: Recent Labs    03/18/21 1039 03/20/21 0456 03/24/21 0000 03/26/21 0000 04/09/21 0000 06/12/21 0000  NA 136 135   < > 140 135* 137  K 3.9 3.2*   < > 4.0 4.3 4.4  CL 101 106   < > 107 103 104  CO2 26 22   < > 27* 25* 25*  GLUCOSE 143* 96  --   --   --   --   BUN 18 20   < > '13 13 16  '$ CREATININE 0.99 0.70   < > 0.8 1.0 1.2*  CALCIUM 9.7 8.1*   < > 8.9 8.7 8.8   < > =  values in this interval not displayed.   Recent Labs    03/18/21 1039 04/09/21 0000 06/12/21 0000  AST '22 13 16  '$ ALT '15 8 10  '$ ALKPHOS 87 80 82  BILITOT 0.9  --   --   PROT 7.7  --   --  ALBUMIN 4.5 3.8 4.0   Recent Labs    03/18/21 1039 03/20/21 0456 03/26/21 0000 04/09/21 0000 06/12/21 0000  WBC 7.2 7.8 5.5 5.2 3.8  NEUTROABS 6.2 6.0 3,641.00 4,113.00 2,466.00  HGB 13.7 11.1* 10.7* 11.5* 11.8*  HCT 41.3 34.1* 32* 35* 36  MCV 92.2 93.7  --   --   --   PLT 204 145* 207 198 180   Lab Results  Component Value Date   TSH 0.661 03/19/2021   Lab Results  Component Value Date   HGBA1C 6.0 (H) 03/19/2021   Lab Results  Component Value Date   CHOL 192 03/19/2021   HDL 69 03/19/2021   LDLCALC 107 (H) 03/19/2021   LDLDIRECT 114.7 08/18/2011   TRIG 78 03/19/2021   CHOLHDL 2.8 03/19/2021    Significant Diagnostic Results in last 30 days:  No results found.  Assessment/Plan Chronic anemia  Hx of Hgb 6.8 in hospital s/p 2 units of PRBC,  Vit B12, Iron, Hgb 11.1 02/12/22  GERD GERD/chronic gastritis/atrophic gastritis, GI bleed 02/2020, s/p EGD, per biopsy, takes Protonix. F/u GI prn  Fibromyalgia takes Lyrica, chronic intermittent aches/pains  Slow transit constipation Stable,  takes MiraLax 2x/wk. Colace daily.   Atrophic vaginitis Urinary symptoms, on  Nitrofurantoin '50mg'$  qd for UTI suppression, Dr. Karsten Ro.              Estrace vaginal cream for atrophic vaginitis.   HTN (hypertension)  blood pressure is controlled on Metoprolol. Bun/creat 20/0.86 02/12/22  Macular degeneration f/u [facility]: OU for Scotoma MD involving tcentral area. OS exudative age related MD  CVA (cerebral vascular accident) ([place]) Hx of CVA, CT x2 03/2021 showed no acute findings except age indeterminate right corona radiator infarct, small vessel changes. Neurology recommended no further work ups  COPD (chronic obstructive pulmonary disease) with chronic bronchitis  intermittent  chronic mild expiratory wheezes, c/o SOB, uses O2 more than prior,  takes Symbicort, Zyrtec, Mucinex, DuoNeb, prn Albuterol, cough is chronic, on Prednisone low dose. treated with higher dose of Prednisone for symptomatic control.   Edema, peripheral New, weight gained about #5Ibs in the past 3 months, moist rales bibasilar present, will adding Furosemide '20mg'$ , Kcl 68mq qd, BMP, BNP 1 week, obtain echocardiogram. 03/19/21 Echo EF >75% 03/12/22 Echo EF 70%     Family/ staff Communication: plan of care reviewed with the patient and charge nurse.   Labs/tests ordered: BMP, BNP one wk, echocardiogram.   Time spend 40 minutes.

## 2022-03-09 NOTE — Assessment & Plan Note (Signed)
GERD/chronic gastritis/atrophic gastritis, GI bleed 02/2020, s/p EGD, per biopsy, takes Protonix. F/u GI prn

## 2022-03-15 DIAGNOSIS — S9031XA Contusion of right foot, initial encounter: Secondary | ICD-10-CM | POA: Diagnosis not present

## 2022-03-16 DIAGNOSIS — D649 Anemia, unspecified: Secondary | ICD-10-CM | POA: Diagnosis not present

## 2022-03-16 DIAGNOSIS — R600 Localized edema: Secondary | ICD-10-CM | POA: Diagnosis not present

## 2022-03-16 DIAGNOSIS — I10 Essential (primary) hypertension: Secondary | ICD-10-CM | POA: Diagnosis not present

## 2022-03-31 ENCOUNTER — Encounter: Payer: Self-pay | Admitting: Adult Health

## 2022-03-31 ENCOUNTER — Non-Acute Institutional Stay: Payer: Medicare Other | Admitting: Adult Health

## 2022-03-31 DIAGNOSIS — R42 Dizziness and giddiness: Secondary | ICD-10-CM

## 2022-03-31 DIAGNOSIS — M79671 Pain in right foot: Secondary | ICD-10-CM | POA: Diagnosis not present

## 2022-03-31 DIAGNOSIS — I63411 Cerebral infarction due to embolism of right middle cerebral artery: Secondary | ICD-10-CM

## 2022-03-31 DIAGNOSIS — R21 Rash and other nonspecific skin eruption: Secondary | ICD-10-CM | POA: Diagnosis not present

## 2022-03-31 MED ORDER — MECLIZINE HCL 12.5 MG PO TABS: 12.5000 mg | ORAL_TABLET | Freq: Two times a day (BID) | ORAL | 0 refills | Status: AC | PRN

## 2022-03-31 NOTE — Progress Notes (Signed)
Location:  [facility] Room Number: [id number] Place of Service:  ALF (13) Provider:  Durenda Age, DNP, FNP-BC  Patient Care Team: Mast, Man X, NP as PCP - General (Internal Medicine) Mast, Man X, NP as Nurse Practitioner (Internal Medicine)  Extended Emergency Contact Information Primary Emergency Contact: Gago,Jeff Home Phone: [phone] Relation: Son Secondary Emergency Contact: Culbreth Sr.,Christopher Home Phone: [phone] Relation: Relative  Code Status:   DNR  Goals of care: Advanced Directive information    03/09/2022   10:01 AM  Advanced Directives  Does Patient Have a Medical Advance Directive? Yes  Type of Advance Directive Living will;Out of facility DNR (pink MOST or yellow form)  Does patient want to make changes to medical advance directive? No - Patient declined  Pre-existing out of facility DNR order (yellow form or pink MOST form) Yellow form placed in chart (order not valid for inpatient use)     Chief Complaint  Patient presents with   Acute Visit    Dizzy spell    HPI:  Pt is a 89 y.o. female seen today for an acute visit regarding an episode of dizziness and lightheadedness last night. She was seen in the room today. She stated that her dizziness resolved already and had an episode of dizziness before too. SBP ranging from 122 to 134. She takes Metoprolol tartrate 12.5 mg BID. She takes Furosemide 20 mg daily for lower extremity edema, has trace edema of BLE. She complains of pain on her right anterior foot. Noted an old bruising on the area. Imaging of right foot was done on 1/29/234 was negative for fractures. Right anterior foot was tender to touch. She has flat erythematous rashes on her right cheek. She denies pruritus. She has O2 at 4L/min via [place] continuously. No SOB nor wheezing.     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     Fibromyalgia    GERD 12/19/2006   Qualifier: Diagnosis of  By: Julien Girt CMA, Marliss Czar  04/13/16 TSH 1.20, Na 141, K 4.2, Bun 15, creat 0.79, wbc 5.5, Hgb 11.5, plt 283    GERD (gastroesophageal reflux disease)    Hammer toe of second toe of left foot 04/08/2016   Left 2nd   Hearing loss    Hemorrhoids    Hypercholesterolemia    Hypertension    Osteoarthritis 12/19/2006   Qualifier: Diagnosis of  By: Julien Girt CMA, Leigh     Osteoporosis    Past Surgical History:  Procedure Laterality Date   BIOPSY  02/17/2020   Procedure: BIOPSY;  Surgeon: Jackquline Denmark, MD;  Location: [facility];  Service: Endoscopy;;   CATARACT EXTRACTION, BILATERAL  2009   COLONOSCOPY  2009, 2006 , 2017   ESOPHAGOGASTRODUODENOSCOPY (EGD) WITH PROPOFOL N/A 02/17/2020  Procedure: ESOPHAGOGASTRODUODENOSCOPY (EGD) WITH PROPOFOL;  Surgeon: Jackquline Denmark, MD;  Location: [facility];  Service: Endoscopy;  Laterality: N/A;   IR ANGIOGRAM SELECTIVE EACH ADDITIONAL VESSEL  02/17/2020   IR ANGIOGRAM SELECTIVE EACH ADDITIONAL VESSEL  02/17/2020   IR ANGIOGRAM SELECTIVE EACH ADDITIONAL VESSEL  02/17/2020   IR ANGIOGRAM SELECTIVE EACH ADDITIONAL VESSEL  02/17/2020   IR ANGIOGRAM SELECTIVE EACH ADDITIONAL VESSEL  02/17/2020   IR ANGIOGRAM VISCERAL SELECTIVE  02/17/2020   IR ANGIOGRAM VISCERAL SELECTIVE  02/17/2020   IR US GUIDE VASC ACCESS RIGHT  02/17/2020   stapes surgery     Dr Thornell Mule   TONSILLECTOMY AND ADENOIDECTOMY     as a child     Allergies  Allergen Reactions   Penicillins Anaphylaxis   Bisphosphonates Other (See Comments)    [name]   Influenza Vaccines     Unknown   Simvastatin     Unknown    Trazodone And Nefazodone     Felt her throat was closing up/kept her awake   Sulfonamide Derivatives Rash and Other (See Comments)    blisters    Outpatient Encounter Medications as of 03/31/2022  Medication Sig   acetaminophen (TYLENOL) 325 MG tablet Take 650 mg by mouth every 4 (four) hours as needed for fever.   albuterol (VENTOLIN HFA) 108 (90 Base) MCG/ACT inhaler Inhale into the lungs every 6 (six) hours as needed for wheezing or shortness of breath.   benzonatate (TESSALON) 100 MG capsule Take 100 mg by mouth 2 (two) times daily.   budesonide-formoterol (SYMBICORT) 80-4.5 MCG/ACT inhaler Inhale 1 puff into the lungs daily.   calcium carbonate (TUMS EX) 750 MG chewable tablet Chew 750 mg by mouth daily.    cholecalciferol (VITAMIN D) 1000 UNITS tablet Take 1,000 Units by mouth daily.   cyclobenzaprine (FLEXERIL) 5 MG tablet Take 2.5 mg by mouth at bedtime.   docusate sodium (COLACE) 100 MG capsule Take 100 mg by mouth 2 (two) times daily.   ferrous sulfate 325 (65 FE) MG tablet Take 325 mg by mouth. Once A Day on Mon, Wed, Fri   hydrocortisone cream 1 % Apply 1 application topically 2 (two) times daily as needed (wrists).   hydrocortisone valerate cream (WESTCORT) 0.2 % Apply 1 application topically 2 (two) times daily as needed (for itching). Cleanse behind ears with gentle cleanser and dry completely.   ipratropium-albuterol (DUONEB) 0.5-2.5 (3) MG/3ML SOLN Take 3 mLs by nebulization daily as needed.   ipratropium-albuterol (DUONEB) 0.5-2.5 (3) MG/3ML SOLN Take 3 mLs by nebulization 2 (two) times daily for 5 days.   ketoconazole (NIZORAL) 2 % cream Apply 1 application  topically 2 (two) times daily as needed for irritation.   metoprolol tartrate (LOPRESSOR) 25 MG tablet Take 12.5 mg by mouth 2 (two)  times daily.   nitrofurantoin (MACRODANTIN) 50 MG capsule Take 50 mg by mouth at bedtime.   Olopatadine HCl 0.2 % SOLN Place 1 drop into both eyes daily.   polyethylene glycol (MIRALAX / GLYCOLAX) 17 g packet Take 17 g by mouth 2 (two) times a week. Sunday and Wednesday   predniSONE (DELTASONE) 5 MG tablet Take 5 mg by mouth daily.   pregabalin (LYRICA) 75 MG capsule Take 1 capsule (75 mg total) by mouth at bedtime.   Propylene Glycol 0.6 % SOLN Place 1 drop into both eyes 4 (four) times daily.   traMADol (ULTRAM) 50 MG tablet Take 0.5 tablets (25 mg total) by mouth every 8 (eight) hours as needed. (  Patient taking differently: Take 25 mg by mouth every 6 (six) hours as needed.)   vitamin B-12 (CYANOCOBALAMIN) 1000 MCG tablet Take 1,000 mcg by mouth daily.   No facility-administered encounter medications on file as of 03/31/2022.    Review of Systems  Constitutional:  Negative for appetite change, chills, fatigue and fever.  HENT:  Negative for congestion, hearing loss, rhinorrhea and sore throat.   Eyes: Negative.   Respiratory:  Negative for cough, shortness of breath and wheezing.   Cardiovascular:  Negative for chest pain, palpitations and leg swelling.  Gastrointestinal:  Negative for abdominal pain, constipation, diarrhea, nausea and vomiting.  Genitourinary:  Negative for dysuria.  Musculoskeletal:  Negative for arthralgias, back pain and myalgias.  Skin:  Positive for rash. Negative for color change and wound.  Neurological:  Positive for dizziness and light-headedness. Negative for weakness and headaches.  Psychiatric/Behavioral:  Negative for behavioral problems. The patient is not nervous/anxious.        Immunization History  Administered Date(s) Administered   Moderna SARS-COV2 Booster Vaccination 12/25/2019   Moderna Sars-Covid-2 Vaccination 02/17/2019, 03/17/2019   Pneumococcal Conjugate-13 09/01/2015   Pneumococcal Polysaccharide-23 07/07/1998   Td 08/21/2009    Pertinent  Health Maintenance Due  Topic Date Due   DEXA SCAN  Completed   INFLUENZA VACCINE  Discontinued      03/19/2021    8:00 AM 03/20/2021   12:00 AM 03/20/2021   12:00 PM 02/05/2022    9:16 AM 03/09/2022   10:01 AM  Fall Risk  Falls in the past year?    0 0  Was there an injury with Fall?    0 0  Fall Risk Category Calculator    0 0  Fall Risk Category (Retired)    Low   (RETIRED) Patient Fall Risk Level High fall risk High fall risk High fall risk High fall risk   Patient at Risk for Falls Due to    History of fall(s) No Fall Risks  Fall risk Follow up    Falls evaluation completed Falls evaluation completed     Vitals:   03/31/22 0937  BP: 134/66  Pulse: 68  Resp: 18  Temp: 98.1 F (36.7 C)  Weight: 153 lb 9.6 oz (69.7 kg)  Height: 5' 1"$  (1.549 m)   Body mass index is 29.02 kg/m.  Physical Exam Constitutional:      General: She is not in acute distress. HENT:     Head: Normocephalic and atraumatic.     Nose: Nose normal.     Mouth/Throat:     Mouth: Mucous membranes are moist.  Eyes:     Conjunctiva/sclera: Conjunctivae normal.  Cardiovascular:     Rate and Rhythm: Normal rate and regular rhythm.  Pulmonary:     Effort: Pulmonary effort is normal.     Breath sounds: Normal breath sounds.     Comments: O2 @ 4L/min via [place] Abdominal:     General: Bowel sounds are normal.     Palpations: Abdomen is soft.  Musculoskeletal:        General: Normal range of motion.     Cervical back: Normal range of motion.     Comments: Right anterior foot tenderness  Skin:    General: Skin is warm and dry.     Findings: Rash present.     Comments: Flat rashes on right face  Neurological:     General: No focal deficit present.     Mental Status: She is alert and oriented  to person, place, and time.  Psychiatric:        Mood and Affect: Mood normal.        Behavior: Behavior normal.        Thought Content: Thought content normal.        Judgment: Judgment normal.         Labs reviewed: Recent Labs    04/09/21 0000 06/12/21 0000  NA 135* 137  K 4.3 4.4  CL 103 104  CO2 25* 25*  BUN 13 16  CREATININE 1.0 1.2*  CALCIUM 8.7 8.8   Recent Labs    04/09/21 0000 06/12/21 0000  AST 13 16  ALT 8 10  ALKPHOS 80 82  ALBUMIN 3.8 4.0   Recent Labs    04/09/21 0000 06/12/21 0000  WBC 5.2 3.8  NEUTROABS 4,113.00 2,466.00  HGB 11.5* 11.8*  HCT 35* 36  PLT 198 180   Lab Results  Component Value Date   TSH 0.661 03/19/2021   Lab Results  Component Value Date   HGBA1C 6.0 (H) 03/19/2021   Lab Results  Component Value Date   CHOL 192 03/19/2021   HDL 69 03/19/2021   LDLCALC 107 (H) 03/19/2021   LDLDIRECT 114.7 08/18/2011   TRIG 78 03/19/2021   CHOLHDL 2.8 03/19/2021    Significant Diagnostic Results in last 30 days:  No results found.  Assessment/Plan  1. Vertigo -  stated that she had episode of dizziness before -  BMP next lab draw - meclizine (ANTIVERT) 12.5 MG tablet; Take 1 tablet (12.5 mg total) by mouth 2 (two) times daily as needed for up to 14 days for dizziness.  Dispense: 15 tablet; Refill: 0  2. Right foot pain -  x-ray was negative for fracture -  will get venous doppler ultrasound to rule out DVT -  continue Tramadol PRN  3. Rash of face -  Hydrocortisone 1% cream apply to rashes on right face BID X 2 weeks -  keep skin clean and dry    Family/ staff Communication: Discussed plan of care with resident and charge nurse.  Labs/tests ordered: BMP and venous doppler ultrasound of right lower extremity    Durenda Age, DNP, MSN, FNP-BC [place] [phone] (Monday-Friday 8:00 a.m. - 5:00 p.m.) [phone] (after hours)

## 2022-04-01 DIAGNOSIS — R21 Rash and other nonspecific skin eruption: Secondary | ICD-10-CM | POA: Diagnosis not present

## 2022-04-01 DIAGNOSIS — R42 Dizziness and giddiness: Secondary | ICD-10-CM | POA: Diagnosis not present

## 2022-04-02 ENCOUNTER — Non-Acute Institutional Stay: Payer: Medicare Other | Admitting: Nurse Practitioner

## 2022-04-02 DIAGNOSIS — M159 Polyosteoarthritis, unspecified: Secondary | ICD-10-CM

## 2022-04-02 DIAGNOSIS — I63411 Cerebral infarction due to embolism of right middle cerebral artery: Secondary | ICD-10-CM

## 2022-04-02 DIAGNOSIS — I10 Essential (primary) hypertension: Secondary | ICD-10-CM

## 2022-04-02 DIAGNOSIS — R300 Dysuria: Secondary | ICD-10-CM | POA: Diagnosis not present

## 2022-04-02 DIAGNOSIS — J4489 Other specified chronic obstructive pulmonary disease: Secondary | ICD-10-CM

## 2022-04-02 DIAGNOSIS — M79661 Pain in right lower leg: Secondary | ICD-10-CM | POA: Diagnosis not present

## 2022-04-02 DIAGNOSIS — M79671 Pain in right foot: Secondary | ICD-10-CM

## 2022-04-02 DIAGNOSIS — R609 Edema, unspecified: Secondary | ICD-10-CM

## 2022-04-02 DIAGNOSIS — I82401 Acute embolism and thrombosis of unspecified deep veins of right lower extremity: Secondary | ICD-10-CM | POA: Insufficient documentation

## 2022-04-02 LAB — BASIC METABOLIC PANEL
BUN: 19 (ref 4–21)
CO2: 29 — AB (ref 13–22)
Chloride: 104 (ref 99–108)
Creatinine: 1.1 (ref 0.5–1.1)
Glucose: 84
Potassium: 4.5 mEq/L (ref 3.5–5.1)
Sodium: 141 (ref 137–147)

## 2022-04-02 LAB — COMPREHENSIVE METABOLIC PANEL
Calcium: 8.7 (ref 8.7–10.7)
eGFR: 50

## 2022-04-02 NOTE — Progress Notes (Incomplete)
Location:   [facility] Room Number: [id number] Place of Service:  ALF (13) Provider: Lennie Odor Revan Gendron NP  Briahnna Harries X, NP  Patient Care Team: Kasch Borquez X, NP as PCP - General (Internal Medicine) Jerell Demery X, NP as Nurse Practitioner (Internal Medicine)  Extended Emergency Contact Information Primary Emergency Contact: Ammons,Jeff Home Phone: [phone] Relation: Son Secondary Emergency Contact: Culbreth Sr.,Christopher Home Phone: [phone] Relation: Relative  Code Status: DNR Goals of care: Advanced Directive information    03/09/2022   10:01 AM  Advanced Directives  Does Patient Have a Medical Advance Directive? Yes  Type of Advance Directive Living will;Out of facility DNR (pink MOST or yellow form)  Does patient want to make changes to medical advance directive? No - Patient declined  Pre-existing out of facility DNR order (yellow form or pink MOST form) Yellow form placed in chart (order not valid for inpatient use)     Chief Complaint  Patient presents with   Acute Visit    Top of the right foot pain    HPI:  Pt is a 89 y.o. female seen today for an acute visit for the top of the right foot pain, venous US showed no acute thrombus 04/02/22   Edema BLE, on Furosemide, 03/12/22 Echo EF 70%              COPD, intermittent chronic mild expiratory wheezes, c/o SOB, uses O2 more than prior,  takes Symbicort, Zyrtec, Mucinex, DuoNeb, prn Albuterol, cough is chronic, on Prednisone low dose. treated with higher dose of Prednisone for symptomatic control.  Hx of CVA, CT x2 03/2021 showed no acute findings except age indeterminate right corona radiator infarct, small vessel changes. Neurology recommended no further work ups.              Macular degeneration, f/u [facility]: OU for [place] MD involving tcentral area. 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  Bun/creat 19/1.07 04/01/22             Urinary symptoms, on  Nitrofurantoin 64m qd for UTI suppression, Dr. OKarsten RoEstrace vaginal cream for atrophic vaginitis.              Anemia, Hx of Hgb 6.8 in hospital s/p 2 units of PRBC,  Vit B12, Iron, Hgb 11.1 02/12/22             GERD/chronic gastritis/atrophic gastritis, GI bleed 02/2020, s/p EGD, per biopsy, takes Protonix. F/u GI prn Fibromyalgia takes Lyrica             Tactile hallucinations, off meds. TSH 0.661 03/19/21             Constipation, takes MiraLax 2x/wk. Colace daily.              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     Fibromyalgia    GERD 12/19/2006   Qualifier: Diagnosis of  By: Julien Girt CMA, Marliss Czar  04/13/16 TSH 1.20, Na 141, K 4.2, Bun 15, creat 0.79, wbc 5.5, Hgb 11.5, plt 283    GERD (gastroesophageal reflux disease)    Hammer toe of second toe of left foot 04/08/2016   Left 2nd   Hearing loss    Hemorrhoids    Hypercholesterolemia    Hypertension    Osteoarthritis 12/19/2006   Qualifier: Diagnosis of  By: Julien Girt CMA, Leigh     Osteoporosis    Past Surgical History:  Procedure Laterality Date   BIOPSY  02/17/2020   Procedure: BIOPSY;  Surgeon: Jackquline Denmark, MD;  Location: [facility];  Service: Endoscopy;;   CATARACT EXTRACTION, BILATERAL  2009    COLONOSCOPY  2009, 2006 , 2017   ESOPHAGOGASTRODUODENOSCOPY (EGD) WITH PROPOFOL N/A 02/17/2020   Procedure: ESOPHAGOGASTRODUODENOSCOPY (EGD) WITH PROPOFOL;  Surgeon: Jackquline Denmark, MD;  Location: [facility];  Service: Endoscopy;  Laterality: N/A;   IR ANGIOGRAM SELECTIVE EACH ADDITIONAL VESSEL  02/17/2020   IR ANGIOGRAM SELECTIVE EACH ADDITIONAL VESSEL  02/17/2020   IR ANGIOGRAM SELECTIVE EACH ADDITIONAL VESSEL  02/17/2020   IR ANGIOGRAM SELECTIVE EACH ADDITIONAL VESSEL  02/17/2020   IR ANGIOGRAM SELECTIVE EACH ADDITIONAL VESSEL  02/17/2020   IR ANGIOGRAM VISCERAL SELECTIVE  02/17/2020   IR ANGIOGRAM VISCERAL SELECTIVE  02/17/2020   IR US GUIDE VASC ACCESS RIGHT  02/17/2020   stapes surgery     Dr Thornell Mule   TONSILLECTOMY AND ADENOIDECTOMY     as a child    Allergies  Allergen Reactions   Penicillins Anaphylaxis   Bisphosphonates Other (See Comments)    [name]   Influenza Vaccines     Unknown   Simvastatin     Unknown    Trazodone And Nefazodone     Felt her throat was closing up/kept her awake   Sulfonamide Derivatives Rash and Other (See Comments)    blisters    Allergies as of 04/02/2022       Reactions   Penicillins Anaphylaxis   Bisphosphonates Other (See Comments)   [name]   Influenza Vaccines    Unknown   Simvastatin    Unknown    Trazodone And Nefazodone    Felt her throat was closing up/kept her awake   Sulfonamide Derivatives Rash, Other (See Comments)   blisters        Medication List        Accurate as of April 02, 2022 11:59 PM. If you have any questions, ask your nurse or doctor.          acetaminophen 325 MG tablet Commonly known as: TYLENOL Take 650 mg by mouth every 4 (four) hours as needed for fever.   albuterol 108 (90 Base) MCG/ACT inhaler Commonly known as: VENTOLIN HFA Inhale into the lungs every 6 (six) hours as needed for wheezing or shortness of breath.   benzonatate 100 MG capsule Commonly known as: TESSALON Take 100 mg  by mouth 2 (two) times daily.   budesonide-formoterol  80-4.5 MCG/ACT inhaler Commonly known as: SYMBICORT Inhale 1 puff into the lungs daily.   calcium carbonate 750 MG chewable tablet Commonly known as: TUMS EX Chew 750 mg by mouth daily.   cholecalciferol 1000 units tablet Commonly known as: VITAMIN D Take 1,000 Units by mouth daily.   cyanocobalamin 1000 MCG tablet Commonly known as: VITAMIN B12 Take 1,000 mcg by mouth daily.   cyclobenzaprine 5 MG tablet Commonly known as: FLEXERIL Take 2.5 mg by mouth at bedtime.   docusate sodium 100 MG capsule Commonly known as: COLACE Take 100 mg by mouth 2 (two) times daily.   ferrous sulfate 325 (65 FE) MG tablet Take 325 mg by mouth. Once A Day on Mon, Wed, Fri   hydrocortisone cream 1 % Apply 1 application topically 2 (two) times daily as needed (wrists).   hydrocortisone valerate cream 0.2 % Commonly known as: WESTCORT Apply 1 application topically 2 (two) times daily as needed (for itching). Cleanse behind ears with gentle cleanser and dry completely.   ipratropium-albuterol 0.5-2.5 (3) MG/3ML Soln Commonly known as: DUONEB Take 3 mLs by nebulization daily as needed.   ipratropium-albuterol 0.5-2.5 (3) MG/3ML Soln Commonly known as: DUONEB Take 3 mLs by nebulization 2 (two) times daily for 5 days.   ketoconazole 2 % cream Commonly known as: NIZORAL Apply 1 application  topically 2 (two) times daily as needed for irritation.   meclizine 12.5 MG tablet Commonly known as: ANTIVERT Take 1 tablet (12.5 mg total) by mouth 2 (two) times daily as needed for up to 14 days for dizziness.   metoprolol tartrate 25 MG tablet Commonly known as: LOPRESSOR Take 12.5 mg by mouth 2 (two) times daily.   nitrofurantoin 50 MG capsule Commonly known as: MACRODANTIN Take 50 mg by mouth at bedtime.   Olopatadine HCl 0.2 % Soln Place 1 drop into both eyes daily.   polyethylene glycol 17 g packet Commonly known as: MIRALAX /  GLYCOLAX Take 17 g by mouth 2 (two) times a week. Sunday and Wednesday   predniSONE 5 MG tablet Commonly known as: DELTASONE Take 5 mg by mouth daily.   pregabalin 75 MG capsule Commonly known as: LYRICA Take 1 capsule (75 mg total) by mouth at bedtime.   Propylene Glycol 0.6 % Soln Place 1 drop into both eyes 4 (four) times daily.   traMADol 50 MG tablet Commonly known as: ULTRAM Take 0.5 tablets (25 mg total) by mouth every 8 (eight) hours as needed. What changed: when to take this        Review of Systems  Constitutional:  Positive for fatigue. Negative for appetite change and fever.       #5Ibs in the past 3 months.   HENT:  Positive for hearing loss. Negative for congestion and voice change.   Eyes:  Negative for visual disturbance.       C/o blurred vision, ? Color, ? Right eye peripheral vision loss-f/u Ophthalmology  Respiratory:  Positive for cough and shortness of breath. Negative for chest tightness and wheezing.        DOE  Cardiovascular:  Positive for leg swelling. Negative for chest pain.  Gastrointestinal:  Negative for abdominal pain and constipation.  Genitourinary:  Negative for dysuria and urgency.       Chronic, on and off  Musculoskeletal:  Positive for arthralgias, back pain and gait problem. Negative for myalgias.  Skin:  Negative for color change.  Neurological:  Negative for speech difficulty, weakness and light-headedness.  Psychiatric/Behavioral:  Negative for behavioral problems, hallucinations and sleep disturbance.        Chronic going to bed late, sleeping in late. Lethargic upon my examination.     Immunization History  Administered Date(s) Administered   Moderna SARS-COV2 Booster Vaccination 12/25/2019   Moderna Sars-Covid-2 Vaccination 02/17/2019, 03/17/2019   Pneumococcal Conjugate-13 09/01/2015   Pneumococcal Polysaccharide-23 07/07/1998   Td 08/21/2009   Pertinent  Health Maintenance Due  Topic Date Due   DEXA SCAN  Completed    INFLUENZA VACCINE  Discontinued      03/19/2021    8:00 AM 03/20/2021   12:00 AM 03/20/2021   12:00 PM 02/05/2022    9:16 AM 03/09/2022   10:01 AM  Fall Risk  Falls in the past year?    0 0  Was there an injury with Fall?    0 0  Fall Risk Category Calculator    0 0  Fall Risk Category (Retired)    Low   (RETIRED) Patient Fall Risk Level High fall risk High fall risk High fall risk High fall risk   Patient at Risk for Falls Due to    History of fall(s) No Fall Risks  Fall risk Follow up    Falls evaluation completed Falls evaluation completed   Functional Status Survey:    Vitals:   04/02/22 1013  BP: 138/74  Pulse: 74  Resp: 18  Temp: 97.6 F (36.4 C)  SpO2: 94%  Weight: 153 lb 9.6 oz (69.7 kg)   Body mass index is 29.02 kg/m. Physical Exam Vitals and nursing note reviewed.  Constitutional:      Appearance: Normal appearance.  HENT:     Head: Normocephalic and atraumatic.     Nose: Nose normal.     Mouth/Throat:     Mouth: Mucous membranes are moist.  Eyes:     Extraocular Movements: Extraocular movements intact.     Conjunctiva/sclera: Conjunctivae normal.     Pupils: Pupils are equal, round, and reactive to light.     Comments: Able to count my fingers 3 feet away from her eyes.   Cardiovascular:     Rate and Rhythm: Normal rate and regular rhythm.     Heart sounds: No murmur heard. Pulmonary:     Effort: Pulmonary effort is normal.     Breath sounds: Rales present. No wheezing.     Comments: Decreased air entry to both lungs. Needs O2 via [place] to maintain SatO2>90%. Moist rales bibasilar.  Chest:     Chest wall: No tenderness.  Abdominal:     General: Bowel sounds are normal.     Palpations: Abdomen is soft.     Tenderness: There is no abdominal tenderness.  Musculoskeletal:     Cervical back: Normal range of motion and neck supple.     Right lower leg: Edema present.     Left lower leg: Edema present.     Comments: 2-3 +edema BLE, new  Skin:     General: Skin is warm and dry.  Neurological:     General: No focal deficit present.     Mental Status: She is alert. Mental status is at baseline.     Motor: No weakness.     Coordination: Coordination normal.     Gait: Gait abnormal.     Comments: Oriented to person, place.   Psychiatric:        Mood and Affect: Mood normal.     Comments: Generalized weakness, less interactive.  Labs reviewed: Recent Labs    04/09/21 0000 06/12/21 0000  NA 135* 137  K 4.3 4.4  CL 103 104  CO2 25* 25*  BUN 13 16  CREATININE 1.0 1.2*  CALCIUM 8.7 8.8   Recent Labs    04/09/21 0000 06/12/21 0000  AST 13 16  ALT 8 10  ALKPHOS 80 82  ALBUMIN 3.8 4.0   Recent Labs    04/09/21 0000 06/12/21 0000  WBC 5.2 3.8  NEUTROABS 4,113.00 2,466.00  HGB 11.5* 11.8*  HCT 35* 36  PLT 198 180   Lab Results  Component Value Date   TSH 0.661 03/19/2021   Lab Results  Component Value Date   HGBA1C 6.0 (H) 03/19/2021   Lab Results  Component Value Date   CHOL 192 03/19/2021   HDL 69 03/19/2021   LDLCALC 107 (H) 03/19/2021   LDLDIRECT 114.7 08/18/2011   TRIG 78 03/19/2021   CHOLHDL 2.8 03/19/2021    Significant Diagnostic Results in last 30 days:  No results found.  Assessment/Plan: Edema, peripheral Edema BLE, on Furosemide, 03/12/22 Echo EF 70%  COPD (chronic obstructive pulmonary disease) with chronic bronchitis intermittent chronic mild expiratory wheezes, c/o SOB, uses O2 more than prior,  takes Symbicort, Zyrtec, Mucinex, DuoNeb, prn Albuterol, cough is chronic, on Prednisone low dose. treated with higher dose of Prednisone for symptomatic control.   CVA (cerebral vascular accident) [facility]) Hx of CVA, CT x2 03/2021 showed no acute findings except age indeterminate right corona radiator infarct, small vessel changes. Neurology recommended no further work ups.  Osteoarthritis Chronic lower back, leg pain, on Flexeril qhs, Lyrica   HTN (hypertension) blood pressure is  controlled on Metoprolol. Bun/creat 19/1.07 04/01/22  Dysuria  on  Nitrofurantoin 36m qd for UTI suppression, Dr. OKarsten Ro Estrace vaginal cream for atrophic vaginitis.   Right foot pain Able to bear weight, mild swelling and bruise on the top of the right forefoot, 04/02/22 venous [place] no acute thrombus, will apply Biofreeze qid to the area x 2 weeks.     Family/ staff Communication: plan of care reviewed with the patient and charge nurse.   Labs/tests ordered:  none  Time spend 40 minutes.

## 2022-04-02 NOTE — Assessment & Plan Note (Signed)
blood pressure is controlled on Metoprolol. Bun/creat 20/0.86 02/12/22 

## 2022-04-02 NOTE — Assessment & Plan Note (Signed)
intermittent chronic mild expiratory wheezes, c/o SOB, uses O2 more than prior,  takes Symbicort, Zyrtec, Mucinex, DuoNeb, prn Albuterol, cough is chronic, on Prednisone low dose. treated with higher dose of Prednisone for symptomatic control.

## 2022-04-02 NOTE — Assessment & Plan Note (Signed)
DVT venous US 04/02/22 finding suggested DVT/SVT RLE, starts Eliquis 57m bid x 7 days then 510mbid if HPOA consents.

## 2022-04-02 NOTE — Assessment & Plan Note (Signed)
Chronic lower back, leg pain, on Flexeril qhs, Lyrica

## 2022-04-02 NOTE — Assessment & Plan Note (Signed)
on  Nitrofurantoin 43m qd for UTI suppression, Dr. OKarsten Ro Estrace vaginal cream for atrophic vaginitis.

## 2022-04-02 NOTE — Assessment & Plan Note (Signed)
Edema BLE, on Furosemide, 03/12/22 Echo EF 70%

## 2022-04-02 NOTE — Assessment & Plan Note (Signed)
Hx of CVA, CT x2 03/2021 showed no acute findings except age indeterminate right corona radiator infarct, small vessel changes. Neurology recommended no further work ups.

## 2022-04-05 ENCOUNTER — Encounter: Payer: Self-pay | Admitting: Nurse Practitioner

## 2022-04-05 DIAGNOSIS — M79671 Pain in right foot: Secondary | ICD-10-CM | POA: Insufficient documentation

## 2022-04-05 NOTE — Assessment & Plan Note (Signed)
Able to bear weight, mild swelling and bruise on the top of the right forefoot, 04/02/22 venous US RLE no acute thrombus, will apply Biofreeze qid to the area x 2 weeks.

## 2022-04-14 ENCOUNTER — Other Ambulatory Visit: Payer: Self-pay | Admitting: Adult Health

## 2022-04-14 MED ORDER — TRAMADOL HCL 50 MG PO TABS
25.0000 mg | ORAL_TABLET | Freq: Four times a day (QID) | ORAL | 0 refills | Status: DC | PRN
Start: 2022-04-14 — End: 2022-08-02

## 2022-05-05 ENCOUNTER — Other Ambulatory Visit: Payer: Self-pay | Admitting: Adult Health

## 2022-05-05 DIAGNOSIS — G629 Polyneuropathy, unspecified: Secondary | ICD-10-CM

## 2022-05-05 DIAGNOSIS — M159 Polyosteoarthritis, unspecified: Secondary | ICD-10-CM

## 2022-05-05 MED ORDER — PREGABALIN 75 MG PO CAPS: 75.0000 mg | ORAL_CAPSULE | Freq: Every day | ORAL | 0 refills | Status: DC

## 2022-05-06 DIAGNOSIS — R278 Other lack of coordination: Secondary | ICD-10-CM | POA: Diagnosis not present

## 2022-05-06 DIAGNOSIS — M62521 Muscle wasting and atrophy, not elsewhere classified, right upper arm: Secondary | ICD-10-CM | POA: Diagnosis not present

## 2022-05-07 DIAGNOSIS — R278 Other lack of coordination: Secondary | ICD-10-CM | POA: Diagnosis not present

## 2022-05-07 DIAGNOSIS — M62521 Muscle wasting and atrophy, not elsewhere classified, right upper arm: Secondary | ICD-10-CM | POA: Diagnosis not present

## 2022-05-10 ENCOUNTER — Encounter: Payer: Self-pay | Admitting: Nurse Practitioner

## 2022-05-10 ENCOUNTER — Non-Acute Institutional Stay: Payer: Medicare Other | Admitting: Nurse Practitioner

## 2022-05-10 DIAGNOSIS — I10 Essential (primary) hypertension: Secondary | ICD-10-CM

## 2022-05-10 DIAGNOSIS — M159 Polyosteoarthritis, unspecified: Secondary | ICD-10-CM

## 2022-05-10 DIAGNOSIS — D649 Anemia, unspecified: Secondary | ICD-10-CM | POA: Diagnosis not present

## 2022-05-10 DIAGNOSIS — M797 Fibromyalgia: Secondary | ICD-10-CM

## 2022-05-10 DIAGNOSIS — M62521 Muscle wasting and atrophy, not elsewhere classified, right upper arm: Secondary | ICD-10-CM | POA: Diagnosis not present

## 2022-05-10 DIAGNOSIS — N952 Postmenopausal atrophic vaginitis: Secondary | ICD-10-CM

## 2022-05-10 DIAGNOSIS — J4489 Other specified chronic obstructive pulmonary disease: Secondary | ICD-10-CM

## 2022-05-10 DIAGNOSIS — R278 Other lack of coordination: Secondary | ICD-10-CM | POA: Diagnosis not present

## 2022-05-10 DIAGNOSIS — R609 Edema, unspecified: Secondary | ICD-10-CM | POA: Diagnosis not present

## 2022-05-10 DIAGNOSIS — M19011 Primary osteoarthritis, right shoulder: Secondary | ICD-10-CM | POA: Diagnosis not present

## 2022-05-10 NOTE — Progress Notes (Signed)
Location:   [facility] Room Number: [id number] Place of Service:  ALF (13) Provider: Lennie Odor Monroe Qin NP  Emiel Kielty X, NP  Patient Care Team: Lourie Retz X, NP as PCP - General (Internal Medicine) Kymberley Raz X, NP as Nurse Practitioner (Internal Medicine)  Extended Emergency Contact Information Primary Emergency Contact: Cacciola,Jeff Home Phone: [phone] Relation: Son Secondary Emergency Contact: Culbreth Sr.,Christopher Home Phone: [phone] Relation: Relative  Code Status: DNR Goals of care: Advanced Directive information    03/09/2022   10:01 AM  Advanced Directives  Does Patient Have a Medical Advance Directive? Yes  Type of Advance Directive Living will;Out of facility DNR (pink MOST or yellow form)  Does patient want to make changes to medical advance directive? No - Patient declined  Pre-existing out of facility DNR order (yellow form or pink MOST form) Yellow form placed in chart (order not valid for inpatient use)     Chief Complaint  Patient presents with   Acute Visit    Right shoulder pain    HPI:  Pt is a 89 y.o. female seen today for an acute visit for persisted R shoulder, limited ROM,  pain with ROM especially over head, Tramadol helped, the patient declined Ortho referral, agreed to pain management.    Edema BLE, on Furosemide, 03/12/22 Echo EF 70%              COPD, intermittent chronic mild expiratory wheezes, c/o SOB, uses O2 more than prior,  takes Symbicort, Zyrtec, Mucinex, DuoNeb, prn Albuterol, cough is chronic, on Prednisone low dose. treated with higher dose of Prednisone for symptomatic control.  Hx of CVA, CT x2 03/2021 showed no acute findings except age indeterminate right corona radiator infarct, small vessel changes. Neurology recommended no further work ups.              Macular degeneration, f/u [facility]: OU for Scotoma MD involving tcentral area. OS exudative age related MD             Chronic lower back, leg pain, on Flexeril qhs,  Lyrica              HTN, blood pressure is controlled on Metoprolol. Bun/creat 19/1.07 04/01/22             Urinary symptoms, on  Nitrofurantoin 50mg  qd for UTI suppression, Dr. Karsten Ro Estrace vaginal cream for atrophic vaginitis.              Anemia, Hx of Hgb 6.8 in hospital s/p 2 units of PRBC,  Vit B12, Iron, Hgb 11.1 02/12/22             GERD/chronic gastritis/atrophic gastritis, GI bleed 02/2020, s/p EGD, per biopsy, takes Protonix. F/u GI prn Fibromyalgia takes Lyrica             Tactile hallucinations, off meds. TSH 0.661 03/19/21             Constipation, takes MiraLax 2x/wk. Colace daily.              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     Fibromyalgia    GERD 12/19/2006   Qualifier: Diagnosis of  By: Julien Girt CMA, Marliss Czar  04/13/16 TSH 1.20, Na 141, K 4.2, Bun 15, creat 0.79, wbc 5.5, Hgb 11.5, plt 283    GERD (gastroesophageal reflux disease)    Hammer toe of second toe of left foot 04/08/2016   Left 2nd   Hearing loss    Hemorrhoids    Hypercholesterolemia    Hypertension    Osteoarthritis 12/19/2006   Qualifier: Diagnosis of  By: Julien Girt CMA, Leigh     Osteoporosis    Past Surgical History:  Procedure Laterality Date   BIOPSY  02/17/2020   Procedure: BIOPSY;  Surgeon: Jackquline Denmark, MD;  Location: [facility];  Service: Endoscopy;;   CATARACT EXTRACTION, BILATERAL  2009   COLONOSCOPY  2009, 2006 , 2017   ESOPHAGOGASTRODUODENOSCOPY (EGD) WITH PROPOFOL N/A 02/17/2020   Procedure: ESOPHAGOGASTRODUODENOSCOPY (EGD) WITH PROPOFOL;  Surgeon: Jackquline Denmark, MD;  Location: [facility];  Service: Endoscopy;  Laterality: N/A;   IR ANGIOGRAM SELECTIVE EACH ADDITIONAL VESSEL  02/17/2020   IR ANGIOGRAM SELECTIVE EACH ADDITIONAL VESSEL  02/17/2020   IR ANGIOGRAM SELECTIVE EACH ADDITIONAL VESSEL  02/17/2020   IR ANGIOGRAM SELECTIVE EACH ADDITIONAL VESSEL  02/17/2020   IR ANGIOGRAM SELECTIVE EACH ADDITIONAL VESSEL  02/17/2020   IR ANGIOGRAM VISCERAL SELECTIVE  02/17/2020   IR ANGIOGRAM VISCERAL SELECTIVE  02/17/2020   IR US GUIDE VASC ACCESS RIGHT  02/17/2020   stapes surgery     Dr Thornell Mule   TONSILLECTOMY AND ADENOIDECTOMY     as a child    Allergies  Allergen Reactions   Penicillins Anaphylaxis   Bisphosphonates Other (See Comments)    [name]   Influenza Vaccines     Unknown   Simvastatin     Unknown    Trazodone And Nefazodone     Felt her throat was closing up/kept her awake   Sulfonamide Derivatives Rash and Other (See Comments)    blisters    Allergies as of 05/10/2022       Reactions   Penicillins Anaphylaxis   Bisphosphonates Other (See Comments)   [name]   Influenza Vaccines    Unknown   Simvastatin    Unknown    Trazodone And Nefazodone    Felt her throat was closing up/kept her awake   Sulfonamide Derivatives Rash, Other (See Comments)   blisters        Medication List        Accurate as of May 10, 2022  3:10 PM. If you have any questions, ask your nurse or doctor.          acetaminophen 325 MG tablet Commonly known as: TYLENOL Take 650 mg by mouth every 4 (four) hours as needed for fever.   albuterol 108 (90 Base) MCG/ACT inhaler Commonly known as: VENTOLIN HFA Inhale into the lungs every 6 (six) hours as needed for wheezing or shortness of  breath.   benzonatate 100 MG capsule Commonly known as: TESSALON Take 100 mg  by mouth 2 (two) times daily.   budesonide-formoterol 80-4.5 MCG/ACT inhaler Commonly known as: SYMBICORT Inhale 1 puff into the lungs daily.   calcium carbonate 750 MG chewable tablet Commonly known as: TUMS EX Chew 750 mg by mouth daily.   cholecalciferol 1000 units tablet Commonly known as: VITAMIN D Take 1,000 Units by mouth daily.   cyanocobalamin 1000 MCG tablet Commonly known as: VITAMIN B12 Take 1,000 mcg by mouth daily.   cyclobenzaprine 5 MG tablet Commonly known as: FLEXERIL Take 2.5 mg by mouth at bedtime.   docusate sodium 100 MG capsule Commonly known as: COLACE Take 100 mg by mouth 2 (two) times daily.   ferrous sulfate 325 (65 FE) MG tablet Take 325 mg by mouth. Once A Day on Mon, Wed, Fri   hydrocortisone cream 1 % Apply 1 application topically 2 (two) times daily as needed (wrists).   hydrocortisone valerate cream 0.2 % Commonly known as: WESTCORT Apply 1 application topically 2 (two) times daily as needed (for itching). Cleanse behind ears with gentle cleanser and dry completely.   ipratropium-albuterol 0.5-2.5 (3) MG/3ML Soln Commonly known as: DUONEB Take 3 mLs by nebulization daily as needed.   ipratropium-albuterol 0.5-2.5 (3) MG/3ML Soln Commonly known as: DUONEB Take 3 mLs by nebulization 2 (two) times daily for 5 days.   ketoconazole 2 % cream Commonly known as: NIZORAL Apply 1 application  topically 2 (two) times daily as needed for irritation.   metoprolol tartrate 25 MG tablet Commonly known as: LOPRESSOR Take 12.5 mg by mouth 2 (two) times daily.   nitrofurantoin 50 MG capsule Commonly known as: MACRODANTIN Take 50 mg by mouth at bedtime.   Olopatadine HCl 0.2 % Soln Place 1 drop into both eyes daily.   polyethylene glycol 17 g packet Commonly known as: MIRALAX / GLYCOLAX Take 17 g by mouth 2 (two) times a week. Sunday and Wednesday   predniSONE  5 MG tablet Commonly known as: DELTASONE Take 5 mg by mouth daily.   pregabalin 75 MG capsule Commonly known as: LYRICA Take 1 capsule (75 mg total) by mouth at bedtime.   Propylene Glycol 0.6 % Soln Place 1 drop into both eyes 4 (four) times daily.   traMADol 50 MG tablet Commonly known as: ULTRAM Take 0.5 tablets (25 mg total) by mouth every 6 (six) hours as needed.        Review of Systems  Constitutional:  Negative for appetite change, fatigue and fever.  HENT:  Positive for hearing loss. Negative for congestion and voice change.   Eyes:  Negative for visual disturbance.       C/o blurred vision, ? Color, ? Right eye peripheral vision loss-f/u Ophthalmology  Respiratory:  Positive for cough and shortness of breath. Negative for chest tightness and wheezing.        DOE, chronic hacking cough  Cardiovascular:  Positive for leg swelling. Negative for chest pain.  Gastrointestinal:  Negative for abdominal pain and constipation.  Genitourinary:  Negative for dysuria and urgency.       Chronic, on and off  Musculoskeletal:  Positive for arthralgias, back pain and gait problem. Negative for joint swelling and myalgias.  Skin:  Negative for color change.  Neurological:  Negative for speech difficulty, weakness and light-headedness.  Psychiatric/Behavioral:  Negative for behavioral problems, hallucinations and sleep disturbance.        Chronic going to bed late, sleeping in late. Lethargic upon my examination.     Immunization History  Administered Date(s)  Administered   Moderna SARS-COV2 Booster Vaccination 12/25/2019   Moderna Sars-Covid-2 Vaccination 02/17/2019, 03/17/2019   Pneumococcal Conjugate-13 09/01/2015   Pneumococcal Polysaccharide-23 07/07/1998   Td 08/21/2009   Pertinent  Health Maintenance Due  Topic Date Due   DEXA SCAN  Completed   INFLUENZA VACCINE  Discontinued      03/19/2021    8:00 AM 03/20/2021   12:00 AM 03/20/2021   12:00 PM 02/05/2022    9:16 AM  03/09/2022   10:01 AM  Fall Risk  Falls in the past year?    0 0  Was there an injury with Fall?    0 0  Fall Risk Category Calculator    0 0  Fall Risk Category (Retired)    Low   (RETIRED) Patient Fall Risk Level High fall risk High fall risk High fall risk High fall risk   Patient at Risk for Falls Due to    History of fall(s) No Fall Risks  Fall risk Follow up    Falls evaluation completed Falls evaluation completed   Functional Status Survey:    Vitals:   05/10/22 1450  BP: (!) 116/56  Pulse: 62  Resp: 20  Temp: (!) 97 F (36.1 C)  SpO2: 93%   There is no height or weight on file to calculate BMI. Physical Exam Vitals and nursing note reviewed.  Constitutional:      Appearance: Normal appearance.  HENT:     Head: Normocephalic and atraumatic.     Nose: Nose normal.     Mouth/Throat:     Mouth: Mucous membranes are moist.  Eyes:     Extraocular Movements: Extraocular movements intact.     Conjunctiva/sclera: Conjunctivae normal.     Pupils: Pupils are equal, round, and reactive to light.     Comments: Able to count my fingers 3 feet away from her eyes.   Cardiovascular:     Rate and Rhythm: Normal rate and regular rhythm.     Heart sounds: No murmur heard. Pulmonary:     Effort: Pulmonary effort is normal.     Breath sounds: Rales present. No wheezing.     Comments: Decreased air entry to both lungs. Needs O2 via [place] to maintain SatO2>90%. Moist rales bibasilar.  Chest:     Chest wall: No tenderness.  Abdominal:     General: Bowel sounds are normal.     Palpations: Abdomen is soft.     Tenderness: There is no abdominal tenderness.  Musculoskeletal:        General: Tenderness present.     Cervical back: Normal range of motion and neck supple.     Right lower leg: Edema present.     Left lower leg: Edema present.     Comments: 1+ edema BLE. R shoulder pain with overhead ROM  Skin:    General: Skin is warm and dry.  Neurological:     General: No focal  deficit present.     Mental Status: She is alert. Mental status is at baseline.     Motor: No weakness.     Coordination: Coordination normal.     Gait: Gait abnormal.     Comments: Oriented to person, place.   Psychiatric:        Mood and Affect: Mood normal.        Behavior: Behavior normal.     Labs reviewed: Recent Labs    06/12/21 0000  NA 137  K 4.4  CL 104  CO2 25*  BUN 16  CREATININE 1.2*  CALCIUM 8.8   Recent Labs    06/12/21 0000  AST 16  ALT 10  ALKPHOS 82  ALBUMIN 4.0   Recent Labs    06/12/21 0000  WBC 3.8  NEUTROABS 2,466.00  HGB 11.8*  HCT 36  PLT 180   Lab Results  Component Value Date   TSH 0.661 03/19/2021   Lab Results  Component Value Date   HGBA1C 6.0 (H) 03/19/2021   Lab Results  Component Value Date   CHOL 192 03/19/2021   HDL 69 03/19/2021   LDLCALC 107 (H) 03/19/2021   LDLDIRECT 114.7 08/18/2011   TRIG 78 03/19/2021   CHOLHDL 2.8 03/19/2021    Significant Diagnostic Results in last 30 days:  No results found.  Assessment/Plan: Osteoarthritis persisted R shoulder, limited ROM,  pain with ROM especially over head, Tramadol helped, the patient declined Ortho referral, agreed to pain management.  Obtain X-ray right shoulder 3 views, increase Tramadol 50mg  tid with meals.   Chronic lower back, leg pain, on Flexeril qhs, Lyrica   Edema, peripheral  on Furosemide, 03/12/22 Echo EF 70%  COPD (chronic obstructive pulmonary disease) with chronic bronchitis intermittent chronic mild expiratory wheezes, c/o SOB, uses O2 more than prior,  takes Symbicort, Zyrtec, Mucinex, DuoNeb, prn Albuterol, cough is chronic, on Prednisone low dose. treated with higher dose of Prednisone for symptomatic control.   HTN (hypertension) blood pressure is controlled on Metoprolol. Bun/creat 19/1.07 04/01/22  Atrophic vaginitis on  Nitrofurantoin 50mg  qd for UTI suppression, Dr. Karsten Ro Estrace vaginal cream for atrophic vaginitis.   Chronic  anemia Hx of Hgb 6.8 in hospital s/p 2 units of PRBC,  Vit B12, Iron, Hgb 11.1 02/12/22  Fibromyalgia  takes Lyrica    Family/ staff Communication: plan of care reviewed with the patient and charge nuse.   Labs/tests ordered: X-ray R shoulder 3 views.   Time spend 40 minutes.

## 2022-05-10 NOTE — Assessment & Plan Note (Signed)
intermittent chronic mild expiratory wheezes, c/o SOB, uses O2 more than prior,  takes Symbicort, Zyrtec, Mucinex, DuoNeb, prn Albuterol, cough is chronic, on Prednisone low dose. treated with higher dose of Prednisone for symptomatic control.  

## 2022-05-10 NOTE — Assessment & Plan Note (Signed)
takes Lyrica 

## 2022-05-10 NOTE — Assessment & Plan Note (Signed)
Hx of Hgb 6.8 in hospital s/p 2 units of PRBC,  Vit B12, Iron, Hgb 11.1 02/12/22 

## 2022-05-10 NOTE — Assessment & Plan Note (Signed)
on  Nitrofurantoin 50mg qd for UTI suppression, Dr. Ottelin. Estrace vaginal cream for atrophic vaginitis.  °

## 2022-05-10 NOTE — Assessment & Plan Note (Addendum)
persisted R shoulder, limited ROM,  pain with ROM especially over head, Tramadol helped, the patient declined Ortho referral, agreed to pain management.  Obtain X-ray right shoulder 3 views, increase Tramadol 50mg  tid with meals.   Chronic lower back, leg pain, on Flexeril qhs, Lyrica

## 2022-05-10 NOTE — Assessment & Plan Note (Signed)
blood pressure is controlled on Metoprolol. Bun/creat 20/0.86 02/12/22 

## 2022-05-10 NOTE — Assessment & Plan Note (Signed)
on Furosemide, 03/12/22 Echo EF 70%

## 2022-05-13 DIAGNOSIS — R278 Other lack of coordination: Secondary | ICD-10-CM | POA: Diagnosis not present

## 2022-05-13 DIAGNOSIS — M62521 Muscle wasting and atrophy, not elsewhere classified, right upper arm: Secondary | ICD-10-CM | POA: Diagnosis not present

## 2022-05-18 DIAGNOSIS — M62521 Muscle wasting and atrophy, not elsewhere classified, right upper arm: Secondary | ICD-10-CM | POA: Diagnosis not present

## 2022-05-18 DIAGNOSIS — R278 Other lack of coordination: Secondary | ICD-10-CM | POA: Diagnosis not present

## 2022-05-18 DIAGNOSIS — R262 Difficulty in walking, not elsewhere classified: Secondary | ICD-10-CM | POA: Diagnosis not present

## 2022-05-18 DIAGNOSIS — M6281 Muscle weakness (generalized): Secondary | ICD-10-CM | POA: Diagnosis not present

## 2022-05-19 DIAGNOSIS — R278 Other lack of coordination: Secondary | ICD-10-CM | POA: Diagnosis not present

## 2022-05-19 DIAGNOSIS — R262 Difficulty in walking, not elsewhere classified: Secondary | ICD-10-CM | POA: Diagnosis not present

## 2022-05-19 DIAGNOSIS — M6281 Muscle weakness (generalized): Secondary | ICD-10-CM | POA: Diagnosis not present

## 2022-05-19 DIAGNOSIS — M62521 Muscle wasting and atrophy, not elsewhere classified, right upper arm: Secondary | ICD-10-CM | POA: Diagnosis not present

## 2022-05-21 DIAGNOSIS — M6281 Muscle weakness (generalized): Secondary | ICD-10-CM | POA: Diagnosis not present

## 2022-05-21 DIAGNOSIS — R278 Other lack of coordination: Secondary | ICD-10-CM | POA: Diagnosis not present

## 2022-05-21 DIAGNOSIS — R262 Difficulty in walking, not elsewhere classified: Secondary | ICD-10-CM | POA: Diagnosis not present

## 2022-05-21 DIAGNOSIS — M62521 Muscle wasting and atrophy, not elsewhere classified, right upper arm: Secondary | ICD-10-CM | POA: Diagnosis not present

## 2022-05-24 ENCOUNTER — Encounter: Payer: Self-pay | Admitting: Orthopaedic Surgery

## 2022-05-24 ENCOUNTER — Ambulatory Visit (INDEPENDENT_AMBULATORY_CARE_PROVIDER_SITE_OTHER): Payer: Medicare Other | Admitting: Orthopaedic Surgery

## 2022-05-24 ENCOUNTER — Other Ambulatory Visit: Payer: Self-pay

## 2022-05-24 VITALS — BP 157/77 | Ht 61.0 in | Wt 153.0 lb

## 2022-05-24 DIAGNOSIS — M25511 Pain in right shoulder: Secondary | ICD-10-CM | POA: Diagnosis not present

## 2022-05-24 DIAGNOSIS — M7541 Impingement syndrome of right shoulder: Secondary | ICD-10-CM | POA: Diagnosis not present

## 2022-05-24 DIAGNOSIS — R278 Other lack of coordination: Secondary | ICD-10-CM | POA: Diagnosis not present

## 2022-05-24 DIAGNOSIS — R262 Difficulty in walking, not elsewhere classified: Secondary | ICD-10-CM | POA: Diagnosis not present

## 2022-05-24 DIAGNOSIS — M62521 Muscle wasting and atrophy, not elsewhere classified, right upper arm: Secondary | ICD-10-CM | POA: Diagnosis not present

## 2022-05-24 DIAGNOSIS — M6281 Muscle weakness (generalized): Secondary | ICD-10-CM | POA: Diagnosis not present

## 2022-05-24 MED ORDER — BUPIVACAINE HCL 0.25 % IJ SOLN
4.0000 mL | INTRAMUSCULAR | Status: AC | PRN
  Administered 2022-05-24: 4 mL via INTRA_ARTICULAR

## 2022-05-24 MED ORDER — LIDOCAINE HCL 1 % IJ SOLN
0.5000 mL | INTRAMUSCULAR | Status: AC | PRN
  Administered 2022-05-24: .5 mL

## 2022-05-24 MED ORDER — METHYLPREDNISOLONE ACETATE 40 MG/ML IJ SUSP
40.0000 mg | INTRAMUSCULAR | Status: AC | PRN
  Administered 2022-05-24: 40 mg via INTRA_ARTICULAR

## 2022-05-24 NOTE — Progress Notes (Signed)
Office Visit Note   Patient: Lisa King           Date of Birth: 11/05/1931           MRN: [id number] Visit Date: 05/24/2022              Requested by: Mast, Man X, NP [address] [place],  [place] [address] PCP: Mast, Man X, NP   Assessment & Plan: Visit Diagnoses:  1. Acute pain of right shoulder     Plan: Right subacromial injection performed she will return if she has ongoing problems.  Follow-Up Instructions: No follow-ups on file.   Orders:  Orders Placed This Encounter  Procedures   XR Shoulder Right   No orders of the defined types were placed in this encounter.     Procedures: Large Joint Inj: R subacromial bursa on 05/24/2022 3:33 PM Indications: pain Details: 22 G 1.5 in needle  Arthrogram: No  Medications: 4 mL bupivacaine 0.25 %; 40 mg methylPREDNISolone acetate 40 MG/ML; 0.5 mL lidocaine 1 % Outcome: tolerated well, no immediate complications Procedure, treatment alternatives, risks and benefits explained, specific risks discussed. Consent was given by the patient. Immediately prior to procedure a time out was called to verify the correct patient, procedure, equipment, support staff and site/side marked as required. Patient was prepped and draped in the usual sterile fashion.       Clinical Data: No additional findings.   Subjective: Chief Complaint  Patient presents with   Right Shoulder - Pain    HPI 89 year old female states that prednisone was seen with her son with right shoulder pain x 7 weeks.  Patient is prosthesis in her air cannot get an MRI scan she has painful range of motion although she can gently and slowly get her arm up overhead no numbness or tingling in her fingers.  She has pulmonary problems uses oxygen at night gets breathing treatments during the day and is currently on prednisone to help her lungs.  She is not having any active dyspnea today.  Review of Systems All systems update noncontributory to  HPI.  Objective: Vital Signs: BP (!) 157/77   Ht 5\' 1"  (1.549 m)   Wt 153 lb (69.4 kg)   BMI 28.91 kg/m   Physical Exam Constitutional:      Appearance: She is well-developed.  HENT:     Head: Normocephalic.     Right Ear: External ear normal.     Left Ear: External ear normal. There is no impacted cerumen.  Eyes:     Pupils: Pupils are equal, round, and reactive to light.  Neck:     Thyroid: No thyromegaly.     Trachea: No tracheal deviation.  Cardiovascular:     Rate and Rhythm: Normal rate.  Pulmonary:     Effort: Pulmonary effort is normal.  Abdominal:     Palpations: Abdomen is soft.  Musculoskeletal:     Cervical back: No rigidity.  Skin:    General: Skin is warm and dry.  Neurological:     Mental Status: She is alert and oriented to person, place, and time.  Psychiatric:        Behavior: Behavior normal.     Ortho Exam lipoma above the medial upper condyle which measures 7 to 8 cm x 4 5 she states has been present there all her life.  Positive impingement no distal migration of the biceps.  Acromioclavicular joint crossarm test negative.  Negative drop arm test positive  Neer test negative Hawkins test.  Negative Yergason test.  Specialty Comments:  No specialty comments available.  Imaging: No results found.   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   Chronic gastritis 02/20/2020   Diverticulosis of large intestine with hemorrhage    Hematochezia    GI bleed 02/16/2020   Systolic murmur 02/16/2020   Chronic anemia 10/16/2019   Cough 10/25/2018    Dysuria 09/12/2018   Atrophic vaginitis 06/28/2018   HTN (hypertension) 11/15/2017   Chest wall pain 11/09/2017   Insomnia secondary to anxiety 07/21/2017   Bilateral dry eyes 02/17/2017   UTI (urinary tract infection) 09/17/2016   Delusions of parasitosis 09/15/2016   Hammer toe of second toe of left foot 04/08/2016   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   Arteriovenous fistula 01/13/2016   Lower gastrointestinal bleed 09/14/2013   Personal history of colonic polyps 09/14/2013   Slow transit constipation 09/14/2013   ABLA (acute blood loss anemia) 08/23/2013   Hearing loss 09/05/2008   Anxiety state 07/02/2007   HYPERCHOLESTEROLEMIA 06/19/2007   COPD (chronic obstructive pulmonary disease) with chronic bronchitis 12/19/2006   GERD 12/19/2006   Osteoarthritis 12/19/2006   Fibromyalgia 12/19/2006   Osteoporosis 12/19/2006   Past Medical History:  Diagnosis Date   Acute blood loss anemia 08/23/2013   04/13/16 TSH 1.20, Na 141, K 4.2, Bun 15, creat 0.79, wbc 5.5, Hgb 11.5, plt 283   Anxiety    Anxiety state 07/02/2007   Qualifier: Diagnosis of  By: Kriste Basque MD, Lonzo Cloud    Asthma    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     Fibromyalgia    GERD 12/19/2006   Qualifier: Diagnosis of  By: Renaldo Fiddler CMA, Marliss Czar  04/13/16 TSH 1.20, Na 141, K 4.2, Bun 15, creat 0.79, wbc 5.5, Hgb 11.5, plt 283    GERD (gastroesophageal reflux disease)    Hammer toe of second toe of left foot 04/08/2016   Left 2nd   Hearing loss    Hemorrhoids    Hypercholesterolemia    Hypertension    Osteoarthritis 12/19/2006   Qualifier: Diagnosis of  By: Renaldo Fiddler CMA, Leigh      Osteoporosis     Family History  Problem Relation Age of Onset   Heart disease Father    COPD Father    Hypertension Mother    Arthritis Mother     Past Surgical History:  Procedure Laterality Date   BIOPSY  02/17/2020   Procedure: BIOPSY;  Surgeon: Lynann Bologna, MD;  Location: [facility];  Service: Endoscopy;;   CATARACT EXTRACTION, BILATERAL  2009   COLONOSCOPY  2009, 2006 , 2017   ESOPHAGOGASTRODUODENOSCOPY (EGD) WITH PROPOFOL N/A 02/17/2020   Procedure: ESOPHAGOGASTRODUODENOSCOPY (EGD) WITH PROPOFOL;  Surgeon: Chales Abrahams,  Filbert Berthold, MD;  Location: [facility];  Service: Endoscopy;  Laterality: N/A;   IR ANGIOGRAM SELECTIVE EACH ADDITIONAL VESSEL  02/17/2020   IR ANGIOGRAM SELECTIVE EACH ADDITIONAL VESSEL  02/17/2020   IR ANGIOGRAM SELECTIVE EACH ADDITIONAL VESSEL  02/17/2020   IR ANGIOGRAM SELECTIVE EACH ADDITIONAL VESSEL  02/17/2020   IR ANGIOGRAM SELECTIVE EACH ADDITIONAL VESSEL  02/17/2020   IR ANGIOGRAM VISCERAL SELECTIVE  02/17/2020   IR ANGIOGRAM VISCERAL SELECTIVE  02/17/2020   IR US GUIDE VASC ACCESS RIGHT  02/17/2020   stapes surgery     Dr Dorma Russell   TONSILLECTOMY AND ADENOIDECTOMY     as a child   Social History   Occupational History   Occupation: retired   Occupation: works some weekends at [employer]  Tobacco Use   Smoking status: Former    Types: Cigarettes    Quit date: 02/15/2010    Years since quitting: 12.2   Smokeless tobacco: Never  Vaping Use   Vaping Use: Never used  Substance and Sexual Activity   Alcohol use: Yes    Comment: occasional glass of wine   Drug use: No   Sexual activity: Never

## 2022-05-25 ENCOUNTER — Encounter: Payer: Self-pay | Admitting: Nurse Practitioner

## 2022-05-25 NOTE — Progress Notes (Signed)
This encounter was created in error - please disregard.

## 2022-05-26 DIAGNOSIS — R262 Difficulty in walking, not elsewhere classified: Secondary | ICD-10-CM | POA: Diagnosis not present

## 2022-05-26 DIAGNOSIS — R278 Other lack of coordination: Secondary | ICD-10-CM | POA: Diagnosis not present

## 2022-05-26 DIAGNOSIS — M6281 Muscle weakness (generalized): Secondary | ICD-10-CM | POA: Diagnosis not present

## 2022-05-26 DIAGNOSIS — M62521 Muscle wasting and atrophy, not elsewhere classified, right upper arm: Secondary | ICD-10-CM | POA: Diagnosis not present

## 2022-05-27 DIAGNOSIS — R262 Difficulty in walking, not elsewhere classified: Secondary | ICD-10-CM | POA: Diagnosis not present

## 2022-05-27 DIAGNOSIS — M62521 Muscle wasting and atrophy, not elsewhere classified, right upper arm: Secondary | ICD-10-CM | POA: Diagnosis not present

## 2022-05-27 DIAGNOSIS — M6281 Muscle weakness (generalized): Secondary | ICD-10-CM | POA: Diagnosis not present

## 2022-05-27 DIAGNOSIS — R278 Other lack of coordination: Secondary | ICD-10-CM | POA: Diagnosis not present

## 2022-05-31 DIAGNOSIS — R262 Difficulty in walking, not elsewhere classified: Secondary | ICD-10-CM | POA: Diagnosis not present

## 2022-05-31 DIAGNOSIS — M6281 Muscle weakness (generalized): Secondary | ICD-10-CM | POA: Diagnosis not present

## 2022-05-31 DIAGNOSIS — M62521 Muscle wasting and atrophy, not elsewhere classified, right upper arm: Secondary | ICD-10-CM | POA: Diagnosis not present

## 2022-05-31 DIAGNOSIS — R278 Other lack of coordination: Secondary | ICD-10-CM | POA: Diagnosis not present

## 2022-06-02 DIAGNOSIS — M6281 Muscle weakness (generalized): Secondary | ICD-10-CM | POA: Diagnosis not present

## 2022-06-02 DIAGNOSIS — M62521 Muscle wasting and atrophy, not elsewhere classified, right upper arm: Secondary | ICD-10-CM | POA: Diagnosis not present

## 2022-06-02 DIAGNOSIS — R262 Difficulty in walking, not elsewhere classified: Secondary | ICD-10-CM | POA: Diagnosis not present

## 2022-06-02 DIAGNOSIS — R278 Other lack of coordination: Secondary | ICD-10-CM | POA: Diagnosis not present

## 2022-06-03 DIAGNOSIS — R262 Difficulty in walking, not elsewhere classified: Secondary | ICD-10-CM | POA: Diagnosis not present

## 2022-06-03 DIAGNOSIS — R278 Other lack of coordination: Secondary | ICD-10-CM | POA: Diagnosis not present

## 2022-06-03 DIAGNOSIS — M6281 Muscle weakness (generalized): Secondary | ICD-10-CM | POA: Diagnosis not present

## 2022-06-03 DIAGNOSIS — M62521 Muscle wasting and atrophy, not elsewhere classified, right upper arm: Secondary | ICD-10-CM | POA: Diagnosis not present

## 2022-06-07 DIAGNOSIS — R262 Difficulty in walking, not elsewhere classified: Secondary | ICD-10-CM | POA: Diagnosis not present

## 2022-06-07 DIAGNOSIS — M62521 Muscle wasting and atrophy, not elsewhere classified, right upper arm: Secondary | ICD-10-CM | POA: Diagnosis not present

## 2022-06-07 DIAGNOSIS — M6281 Muscle weakness (generalized): Secondary | ICD-10-CM | POA: Diagnosis not present

## 2022-06-07 DIAGNOSIS — R278 Other lack of coordination: Secondary | ICD-10-CM | POA: Diagnosis not present

## 2022-06-08 DIAGNOSIS — M62521 Muscle wasting and atrophy, not elsewhere classified, right upper arm: Secondary | ICD-10-CM | POA: Diagnosis not present

## 2022-06-08 DIAGNOSIS — R278 Other lack of coordination: Secondary | ICD-10-CM | POA: Diagnosis not present

## 2022-06-08 DIAGNOSIS — M6281 Muscle weakness (generalized): Secondary | ICD-10-CM | POA: Diagnosis not present

## 2022-06-08 DIAGNOSIS — R262 Difficulty in walking, not elsewhere classified: Secondary | ICD-10-CM | POA: Diagnosis not present

## 2022-06-09 DIAGNOSIS — M6281 Muscle weakness (generalized): Secondary | ICD-10-CM | POA: Diagnosis not present

## 2022-06-09 DIAGNOSIS — R262 Difficulty in walking, not elsewhere classified: Secondary | ICD-10-CM | POA: Diagnosis not present

## 2022-06-09 DIAGNOSIS — M62521 Muscle wasting and atrophy, not elsewhere classified, right upper arm: Secondary | ICD-10-CM | POA: Diagnosis not present

## 2022-06-09 DIAGNOSIS — R278 Other lack of coordination: Secondary | ICD-10-CM | POA: Diagnosis not present

## 2022-06-10 DIAGNOSIS — R262 Difficulty in walking, not elsewhere classified: Secondary | ICD-10-CM | POA: Diagnosis not present

## 2022-06-10 DIAGNOSIS — R278 Other lack of coordination: Secondary | ICD-10-CM | POA: Diagnosis not present

## 2022-06-10 DIAGNOSIS — M62521 Muscle wasting and atrophy, not elsewhere classified, right upper arm: Secondary | ICD-10-CM | POA: Diagnosis not present

## 2022-06-10 DIAGNOSIS — M6281 Muscle weakness (generalized): Secondary | ICD-10-CM | POA: Diagnosis not present

## 2022-06-13 DIAGNOSIS — M6281 Muscle weakness (generalized): Secondary | ICD-10-CM | POA: Diagnosis not present

## 2022-06-13 DIAGNOSIS — M62521 Muscle wasting and atrophy, not elsewhere classified, right upper arm: Secondary | ICD-10-CM | POA: Diagnosis not present

## 2022-06-13 DIAGNOSIS — R278 Other lack of coordination: Secondary | ICD-10-CM | POA: Diagnosis not present

## 2022-06-13 DIAGNOSIS — R262 Difficulty in walking, not elsewhere classified: Secondary | ICD-10-CM | POA: Diagnosis not present

## 2022-06-14 ENCOUNTER — Other Ambulatory Visit: Payer: Self-pay | Admitting: Adult Health

## 2022-06-14 DIAGNOSIS — R278 Other lack of coordination: Secondary | ICD-10-CM | POA: Diagnosis not present

## 2022-06-14 DIAGNOSIS — M62521 Muscle wasting and atrophy, not elsewhere classified, right upper arm: Secondary | ICD-10-CM | POA: Diagnosis not present

## 2022-06-14 DIAGNOSIS — M6281 Muscle weakness (generalized): Secondary | ICD-10-CM | POA: Diagnosis not present

## 2022-06-14 DIAGNOSIS — R262 Difficulty in walking, not elsewhere classified: Secondary | ICD-10-CM | POA: Diagnosis not present

## 2022-06-14 MED ORDER — TRAMADOL HCL 50 MG PO TABS: 50.0000 mg | ORAL_TABLET | Freq: Three times a day (TID) | ORAL | 0 refills | Status: DC

## 2022-06-15 DIAGNOSIS — R262 Difficulty in walking, not elsewhere classified: Secondary | ICD-10-CM | POA: Diagnosis not present

## 2022-06-15 DIAGNOSIS — M62521 Muscle wasting and atrophy, not elsewhere classified, right upper arm: Secondary | ICD-10-CM | POA: Diagnosis not present

## 2022-06-15 DIAGNOSIS — M6281 Muscle weakness (generalized): Secondary | ICD-10-CM | POA: Diagnosis not present

## 2022-06-15 DIAGNOSIS — R278 Other lack of coordination: Secondary | ICD-10-CM | POA: Diagnosis not present

## 2022-06-16 DIAGNOSIS — R278 Other lack of coordination: Secondary | ICD-10-CM | POA: Diagnosis not present

## 2022-06-16 DIAGNOSIS — M6281 Muscle weakness (generalized): Secondary | ICD-10-CM | POA: Diagnosis not present

## 2022-06-16 DIAGNOSIS — M62521 Muscle wasting and atrophy, not elsewhere classified, right upper arm: Secondary | ICD-10-CM | POA: Diagnosis not present

## 2022-06-16 DIAGNOSIS — R262 Difficulty in walking, not elsewhere classified: Secondary | ICD-10-CM | POA: Diagnosis not present

## 2022-06-18 ENCOUNTER — Non-Acute Institutional Stay: Payer: Medicare Other | Admitting: Nurse Practitioner

## 2022-06-18 ENCOUNTER — Encounter: Payer: Self-pay | Admitting: Nurse Practitioner

## 2022-06-18 DIAGNOSIS — R262 Difficulty in walking, not elsewhere classified: Secondary | ICD-10-CM | POA: Diagnosis not present

## 2022-06-18 DIAGNOSIS — R600 Localized edema: Secondary | ICD-10-CM

## 2022-06-18 DIAGNOSIS — I10 Essential (primary) hypertension: Secondary | ICD-10-CM | POA: Diagnosis not present

## 2022-06-18 DIAGNOSIS — J4489 Other specified chronic obstructive pulmonary disease: Secondary | ICD-10-CM | POA: Diagnosis not present

## 2022-06-18 DIAGNOSIS — M62521 Muscle wasting and atrophy, not elsewhere classified, right upper arm: Secondary | ICD-10-CM | POA: Diagnosis not present

## 2022-06-18 DIAGNOSIS — M6281 Muscle weakness (generalized): Secondary | ICD-10-CM | POA: Diagnosis not present

## 2022-06-18 DIAGNOSIS — K219 Gastro-esophageal reflux disease without esophagitis: Secondary | ICD-10-CM | POA: Diagnosis not present

## 2022-06-18 DIAGNOSIS — R300 Dysuria: Secondary | ICD-10-CM

## 2022-06-18 DIAGNOSIS — M159 Polyosteoarthritis, unspecified: Secondary | ICD-10-CM

## 2022-06-18 DIAGNOSIS — K5901 Slow transit constipation: Secondary | ICD-10-CM | POA: Diagnosis not present

## 2022-06-18 DIAGNOSIS — M797 Fibromyalgia: Secondary | ICD-10-CM

## 2022-06-18 DIAGNOSIS — R278 Other lack of coordination: Secondary | ICD-10-CM | POA: Diagnosis not present

## 2022-06-18 NOTE — Progress Notes (Unsigned)
Location:  [facility] Room Number: 917-A Place of Service:  ALF (13) Provider:  Laurelyn Terrero Dicie Beam    Patient Care Team: Danyal Whitenack X, NP as PCP - General (Internal Medicine) Jamis Kryder X, NP as Nurse Practitioner (Internal Medicine)  Extended Emergency Contact Information Primary Emergency Contact: Guercio,Jeff Home Phone: [phone] Relation: Son Secondary Emergency Contact: Culbreth Sr.,Christopher Home Phone: [phone] Relation: Relative  Code Status:  DNR Goals of care: Advanced Directive information    06/18/2022    3:40 PM  Advanced Directives  Does Patient Have a Medical Advance Directive? Yes  Type of Advance Directive Out of facility DNR (pink MOST or yellow form);Living will  Does patient want to make changes to medical advance directive? No - Patient declined  Pre-existing out of facility DNR order (yellow form or pink MOST form) Yellow form placed in chart (order not valid for inpatient use)     Chief Complaint  Patient presents with   Acute Visit    Swelling legs    HPI:  Pt is a 89 y.o. female seen today for an acute visit for more swelling in legs. 06/18/22 wt gained about #2Ibs in the past month, more swelling in BLE, DOE and cough are at her baseline.    Edema BLE, on Furosemide, 03/12/22 Echo EF 70%               COPD, intermittent chronic mild expiratory wheezes, c/o SOB, uses O2 more than prior,  takes Symbicort, Zyrtec, Mucinex, DuoNeb, prn Albuterol, cough is chronic, on Prednisone low dose. treated with higher dose of Prednisone for symptomatic control.  Hx of CVA, CT x2 03/2021 showed no acute findings except age indeterminate right corona radiator infarct, small vessel changes. Neurology recommended no further work ups.              Macular degeneration, f/u [facility]: OU for [place] MD involving tcentral area. 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 Bun/creat 19/1.07 04/01/22             Urinary symptoms, on  Nitrofurantoin 50mg  qd for UTI suppression, Dr. Vernie Ammons Estrace vaginal cream for atrophic vaginitis.              Anemia, Hx of Hgb 6.8 in hospital s/p 2 units of PRBC,  Vit B12, Iron, Hgb 11.1 02/12/22             GERD/chronic gastritis/atrophic gastritis, GI bleed 02/2020, s/p EGD, per biopsy, takes Protonix. F/u GI prn Fibromyalgia takes Lyrica             Tactile hallucinations, off meds. TSH 0.661 03/19/21             Constipation, takes MiraLax 2x/wk. Colace daily.              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     Fibromyalgia    GERD 12/19/2006   Qualifier: Diagnosis of  By: Renaldo Fiddler CMA, Marliss Czar  04/13/16 TSH 1.20, Na 141, K 4.2, Bun 15, creat 0.79, wbc 5.5, Hgb 11.5, plt 283    GERD (gastroesophageal reflux disease)    Hammer toe of second toe of left foot 04/08/2016   Left 2nd   Hearing loss    Hemorrhoids    Hypercholesterolemia    Hypertension    Osteoarthritis 12/19/2006   Qualifier: Diagnosis of  By: Renaldo Fiddler CMA, Leigh     Osteoporosis    Past Surgical History:  Procedure Laterality Date   BIOPSY  02/17/2020   Procedure: BIOPSY;  Surgeon: Lynann Bologna, MD;  Location:  [facility];  Service: Endoscopy;;   CATARACT EXTRACTION, BILATERAL  2009   COLONOSCOPY  2009, 2006 , 2017   ESOPHAGOGASTRODUODENOSCOPY (EGD) WITH PROPOFOL N/A 02/17/2020   Procedure: ESOPHAGOGASTRODUODENOSCOPY (EGD) WITH PROPOFOL;  Surgeon: Lynann Bologna, MD;  Location: [facility];  Service: Endoscopy;  Laterality: N/A;   IR ANGIOGRAM SELECTIVE EACH ADDITIONAL VESSEL  02/17/2020   IR ANGIOGRAM SELECTIVE EACH ADDITIONAL VESSEL  02/17/2020   IR ANGIOGRAM SELECTIVE EACH ADDITIONAL VESSEL  02/17/2020   IR ANGIOGRAM SELECTIVE EACH ADDITIONAL VESSEL  02/17/2020   IR ANGIOGRAM SELECTIVE EACH ADDITIONAL VESSEL  02/17/2020   IR ANGIOGRAM VISCERAL SELECTIVE  02/17/2020   IR ANGIOGRAM VISCERAL SELECTIVE  02/17/2020   IR US GUIDE VASC ACCESS RIGHT  02/17/2020   stapes surgery     Dr Dorma Russell   TONSILLECTOMY AND ADENOIDECTOMY     as a child    Allergies  Allergen Reactions   Penicillins Anaphylaxis   Bisphosphonates Other (See Comments)    [name]   Influenza Vaccines     Unknown   Simvastatin     Unknown    Trazodone And Nefazodone     Felt her throat was closing up/kept her awake   Sulfonamide Derivatives Rash and Other (See Comments)    blisters    Outpatient Encounter Medications as of 06/18/2022  Medication Sig   acetaminophen (TYLENOL) 325 MG tablet Take 650 mg by mouth every 4 (four) hours as needed for fever.   albuterol (VENTOLIN HFA) 108 (90 Base) MCG/ACT inhaler Inhale into the lungs every 6 (six) hours as needed for wheezing or shortness of breath.   benzonatate (TESSALON) 100 MG capsule Take 100 mg by mouth 2 (two) times daily.   budesonide-formoterol (SYMBICORT) 80-4.5 MCG/ACT inhaler Inhale 1 puff into the lungs daily.   calcium carbonate (TUMS EX) 750 MG chewable tablet Chew 750 mg by mouth daily.    cholecalciferol (VITAMIN D) 1000 UNITS tablet Take 1,000 Units by mouth daily.   cyclobenzaprine (FLEXERIL) 5 MG tablet Take 2.5 mg by mouth at bedtime.   docusate sodium (COLACE) 100  MG capsule Take 100 mg by mouth 2 (two) times daily.   ferrous sulfate 325 (65 FE) MG tablet Take 325 mg by mouth. Once A Day on Mon, Wed, Fri  furosemide (LASIX) 20 MG tablet Take 20 mg by mouth daily.   hydrocortisone cream 1 % Apply 1 application topically 2 (two) times daily as needed (wrists).   hydrocortisone valerate cream (WESTCORT) 0.2 % Apply 1 application topically 2 (two) times daily as needed (for itching). Cleanse behind ears with gentle cleanser and dry completely.   ipratropium-albuterol (DUONEB) 0.5-2.5 (3) MG/3ML SOLN Take 3 mLs by nebulization daily as needed.   ketoconazole (NIZORAL) 2 % cream Apply 1 application  topically 2 (two) times daily as needed for irritation.   metoprolol tartrate (LOPRESSOR) 25 MG tablet Take 12.5 mg by mouth 2 (two) times daily.   nitrofurantoin (MACRODANTIN) 50 MG capsule Take 50 mg by mouth at bedtime.   Olopatadine HCl 0.2 % SOLN Place 1 drop into both eyes daily.   polyethylene glycol (MIRALAX / GLYCOLAX) 17 g packet Take 17 g by mouth 2 (two) times a week. Sunday and Wednesday   pramoxine (SARNA SENSITIVE) 1 % LOTN Apply to cheeks topically one time a day for rosy burning cheeks   predniSONE (DELTASONE) 5 MG tablet Take 5 mg by mouth daily.   pregabalin (LYRICA) 75 MG capsule Take 1 capsule (75 mg total) by mouth at bedtime.   Propylene Glycol (SYSTANE COMPLETE) 0.6 % SOLN Instill 1 drop in both eyes four times a day for DRY EYE   traMADol (ULTRAM) 50 MG tablet Take 0.5 tablets (25 mg total) by mouth every 6 (six) hours as needed.   traMADol (ULTRAM) 50 MG tablet Take 1 tablet (50 mg total) by mouth 3 (three) times daily.   vitamin B-12 (CYANOCOBALAMIN) 1000 MCG tablet Take 1,000 mcg by mouth daily.   ipratropium-albuterol (DUONEB) 0.5-2.5 (3) MG/3ML SOLN Take 3 mLs by nebulization 2 (two) times daily for 5 days.   [DISCONTINUED] Propylene Glycol 0.6 % SOLN Place 1 drop into both eyes 4 (four) times daily.   No facility-administered  encounter medications on file as of 06/18/2022.    Review of Systems  Constitutional:  Negative for appetite change, fatigue and fever.  HENT:  Positive for hearing loss. Negative for congestion and voice change.   Eyes:  Negative for visual disturbance.       C/o blurred vision, ? Color, ? Right eye peripheral vision loss-f/u Ophthalmology  Respiratory:  Positive for cough and shortness of breath. Negative for chest tightness and wheezing.        DOE, chronic hacking cough  Cardiovascular:  Positive for leg swelling. Negative for chest pain and palpitations.  Gastrointestinal:  Negative for abdominal pain and constipation.  Genitourinary:  Negative for dysuria and urgency.       Chronic, on and off  Musculoskeletal:  Positive for arthralgias, back pain and gait problem. Negative for joint swelling and myalgias.  Skin:  Negative for color change.       RLE bruise  Neurological:  Negative for speech difficulty, weakness and light-headedness.  Psychiatric/Behavioral:  Negative for behavioral problems, hallucinations and sleep disturbance.        Chronic going to bed late, sleeping in late. Lethargic upon my examination.     Immunization History  Administered Date(s) Administered   Moderna SARS-COV2 Booster Vaccination 12/25/2019   Moderna Sars-Covid-2 Vaccination 02/17/2019, 03/17/2019   Pneumococcal Conjugate-13 09/01/2015   Pneumococcal Polysaccharide-23 07/07/1998   Td 08/21/2009   Pertinent  Health Maintenance Due  Topic Date Due   DEXA SCAN  Completed   INFLUENZA VACCINE  Discontinued      03/19/2021  8:00 AM 03/20/2021   12:00 AM 03/20/2021   12:00 PM 02/05/2022    9:16 AM 03/09/2022   10:01 AM  Fall Risk  Falls in the past year?    0 0  Was there an injury with Fall?    0 0  Fall Risk Category Calculator    0 0  Fall Risk Category (Retired)    Low   (RETIRED) Patient Fall Risk Level High fall risk High fall risk High fall risk High fall risk   Patient at Risk for Falls  Due to    History of fall(s) No Fall Risks  Fall risk Follow up    Falls evaluation completed Falls evaluation completed   Functional Status Survey:    Vitals:   06/18/22 1539  BP: 122/68  Pulse: (!) 54  Resp: 17  Temp: 98 F (36.7 C)  SpO2: 94%  Weight: 164 lb 3.2 oz (74.5 kg)  Height: 5\' 1"  (1.549 m)   Body mass index is 31.03 kg/m. Physical Exam Vitals and nursing note reviewed.  Constitutional:      Appearance: Normal appearance.  HENT:     Head: Normocephalic and atraumatic.     Nose: Nose normal.     Mouth/Throat:     Mouth: Mucous membranes are moist.  Eyes:     Extraocular Movements: Extraocular movements intact.     Conjunctiva/sclera: Conjunctivae normal.     Pupils: Pupils are equal, round, and reactive to light.     Comments: Able to count my fingers 3 feet away from her eyes.   Cardiovascular:     Rate and Rhythm: Normal rate and regular rhythm.     Heart sounds: No murmur heard. Pulmonary:     Effort: Pulmonary effort is normal.     Breath sounds: Rales present. No wheezing.     Comments: Decreased air entry to both lungs. Needs O2 via [place] Capo to maintain SatO2>90%. Moist rales bibasilar.  Chest:     Chest wall: No tenderness.  Abdominal:     General: Bowel sounds are normal.     Palpations: Abdomen is soft.     Tenderness: There is no abdominal tenderness.  Musculoskeletal:        General: Tenderness present.     Cervical back: Normal range of motion and neck supple.     Right lower leg: Edema present.     Left lower leg: Edema present.     Comments: 2+ edema BLE. R shoulder pain with overhead ROM  Skin:    General: Skin is warm and dry.     Findings: Bruising present.     Comments: LLE  Neurological:     General: No focal deficit present.     Mental Status: She is alert. Mental status is at baseline.     Motor: No weakness.     Coordination: Coordination normal.     Gait: Gait abnormal.     Comments: Oriented to person, place.   Psychiatric:         Mood and Affect: Mood normal.        Behavior: Behavior normal.     Labs reviewed: Recent Labs    04/02/22 0000  NA 141  K 4.5  CL 104  CO2 29*  BUN 19  CREATININE 1.1  CALCIUM 8.7   No results for input(s): "AST", "ALT", "ALKPHOS", "BILITOT", "PROT", "ALBUMIN" in the last 8760 hours. No results for input(s): "WBC", "NEUTROABS", "HGB", "HCT", "MCV", "PLT" in the last 8760  hours. Lab Results  Component Value Date   TSH 0.661 03/19/2021   Lab Results  Component Value Date   HGBA1C 6.0 (H) 03/19/2021   Lab Results  Component Value Date   CHOL 192 03/19/2021   HDL 69 03/19/2021   LDLCALC 107 (H) 03/19/2021   LDLDIRECT 114.7 08/18/2011   TRIG 78 03/19/2021   CHOLHDL 2.8 03/19/2021    Significant Diagnostic Results in last 30 days:  No results found.  Assessment/Plan Edema, peripheral 03/12/22 Echo EF 70% 06/18/22 wt gained about #2Ibs in the past month, more swelling in BLE, increase Furosemide to 40mg  qd, BMP one week  COPD (chronic obstructive pulmonary disease) with chronic bronchitis  stable, hx of intermittent chronic mild expiratory wheezes, DOE, uses O2 prn,  takes Symbicort, Zyrtec, Mucinex, DuoNeb, prn Albuterol, cough is chronic, treated with higher dose of Prednisone for symptomatic control. 06/18/22 stable, decrease Prednisone to 10mg  qd.    Osteoarthritis Chronic lower back, leg pain,  R shoulder pain, had Ortho eval, on Flexeril qhs, Lyrica   HTN (hypertension) blood pressure is controlled on Metoprolol. Bun/creat 19/1.07 04/01/22  Dysuria on  Nitrofurantoin 50mg  qd for UTI suppression, Dr. Vernie Ammons Estrace vaginal cream for atrophic vaginitis.   GERD GERD/chronic gastritis/atrophic gastritis, GI bleed 02/2020, s/p EGD, per biopsy, takes Protonix. F/u GI prn  Fibromyalgia  takes Lyrica  Slow transit constipation  takes MiraLax 2x/wk. Colace daily.      Family/ staff Communication: plan of care reviewed with the patient and charge  nurse  Labs/tests ordered:  BMP one week  Time spend 40 minutes.

## 2022-06-21 ENCOUNTER — Encounter: Payer: Self-pay | Admitting: Nurse Practitioner

## 2022-06-21 DIAGNOSIS — R278 Other lack of coordination: Secondary | ICD-10-CM | POA: Diagnosis not present

## 2022-06-21 DIAGNOSIS — R262 Difficulty in walking, not elsewhere classified: Secondary | ICD-10-CM | POA: Diagnosis not present

## 2022-06-21 DIAGNOSIS — M6281 Muscle weakness (generalized): Secondary | ICD-10-CM | POA: Diagnosis not present

## 2022-06-21 DIAGNOSIS — W19XXXA Unspecified fall, initial encounter: Secondary | ICD-10-CM | POA: Insufficient documentation

## 2022-06-21 DIAGNOSIS — M62521 Muscle wasting and atrophy, not elsewhere classified, right upper arm: Secondary | ICD-10-CM | POA: Diagnosis not present

## 2022-06-21 NOTE — Assessment & Plan Note (Signed)
Chronic lower back, leg pain,  R shoulder pain, had Ortho eval, on Flexeril qhs, Lyrica

## 2022-06-21 NOTE — Assessment & Plan Note (Signed)
takes Lyrica 

## 2022-06-21 NOTE — Assessment & Plan Note (Signed)
on  Nitrofurantoin 50mg qd for UTI suppression, Dr. Ottelin. Estrace vaginal cream for atrophic vaginitis.  °

## 2022-06-21 NOTE — Assessment & Plan Note (Signed)
stable, hx of intermittent chronic mild expiratory wheezes, DOE, uses O2 prn,  takes Symbicort, Zyrtec, Mucinex, DuoNeb, prn Albuterol, cough is chronic, treated with higher dose of Prednisone for symptomatic control. 06/18/22 stable, decrease Prednisone to 10mg  qd.

## 2022-06-21 NOTE — Assessment & Plan Note (Signed)
03/12/22 Echo EF 70% 06/18/22 wt gained about #2Ibs in the past month, more swelling in BLE, increase Furosemide to 40mg  qd, BMP one week

## 2022-06-21 NOTE — Assessment & Plan Note (Signed)
takes MiraLax 2x/wk. Colace daily.  °

## 2022-06-21 NOTE — Assessment & Plan Note (Signed)
blood pressure is controlled on Metoprolol. Bun/creat 20/0.86 02/12/22 

## 2022-06-21 NOTE — Assessment & Plan Note (Signed)
GERD/chronic gastritis/atrophic gastritis, GI bleed 02/2020, s/p EGD, per biopsy, takes Protonix. F/u GI prn ?

## 2022-06-22 DIAGNOSIS — R278 Other lack of coordination: Secondary | ICD-10-CM | POA: Diagnosis not present

## 2022-06-22 DIAGNOSIS — R262 Difficulty in walking, not elsewhere classified: Secondary | ICD-10-CM | POA: Diagnosis not present

## 2022-06-22 DIAGNOSIS — M6281 Muscle weakness (generalized): Secondary | ICD-10-CM | POA: Diagnosis not present

## 2022-06-22 DIAGNOSIS — M62521 Muscle wasting and atrophy, not elsewhere classified, right upper arm: Secondary | ICD-10-CM | POA: Diagnosis not present

## 2022-06-23 DIAGNOSIS — R278 Other lack of coordination: Secondary | ICD-10-CM | POA: Diagnosis not present

## 2022-06-23 DIAGNOSIS — M6281 Muscle weakness (generalized): Secondary | ICD-10-CM | POA: Diagnosis not present

## 2022-06-23 DIAGNOSIS — M62521 Muscle wasting and atrophy, not elsewhere classified, right upper arm: Secondary | ICD-10-CM | POA: Diagnosis not present

## 2022-06-23 DIAGNOSIS — R262 Difficulty in walking, not elsewhere classified: Secondary | ICD-10-CM | POA: Diagnosis not present

## 2022-06-24 DIAGNOSIS — M6281 Muscle weakness (generalized): Secondary | ICD-10-CM | POA: Diagnosis not present

## 2022-06-24 DIAGNOSIS — R278 Other lack of coordination: Secondary | ICD-10-CM | POA: Diagnosis not present

## 2022-06-24 DIAGNOSIS — R21 Rash and other nonspecific skin eruption: Secondary | ICD-10-CM | POA: Diagnosis not present

## 2022-06-24 DIAGNOSIS — I10 Essential (primary) hypertension: Secondary | ICD-10-CM | POA: Diagnosis not present

## 2022-06-24 DIAGNOSIS — M62521 Muscle wasting and atrophy, not elsewhere classified, right upper arm: Secondary | ICD-10-CM | POA: Diagnosis not present

## 2022-06-24 DIAGNOSIS — R262 Difficulty in walking, not elsewhere classified: Secondary | ICD-10-CM | POA: Diagnosis not present

## 2022-06-25 ENCOUNTER — Encounter: Payer: Self-pay | Admitting: Nurse Practitioner

## 2022-06-25 DIAGNOSIS — N183 Chronic kidney disease, stage 3 unspecified: Secondary | ICD-10-CM | POA: Insufficient documentation

## 2022-06-28 DIAGNOSIS — R278 Other lack of coordination: Secondary | ICD-10-CM | POA: Diagnosis not present

## 2022-06-28 DIAGNOSIS — M6281 Muscle weakness (generalized): Secondary | ICD-10-CM | POA: Diagnosis not present

## 2022-06-28 DIAGNOSIS — R262 Difficulty in walking, not elsewhere classified: Secondary | ICD-10-CM | POA: Diagnosis not present

## 2022-06-28 DIAGNOSIS — M62521 Muscle wasting and atrophy, not elsewhere classified, right upper arm: Secondary | ICD-10-CM | POA: Diagnosis not present

## 2022-06-29 DIAGNOSIS — M62521 Muscle wasting and atrophy, not elsewhere classified, right upper arm: Secondary | ICD-10-CM | POA: Diagnosis not present

## 2022-06-29 DIAGNOSIS — R278 Other lack of coordination: Secondary | ICD-10-CM | POA: Diagnosis not present

## 2022-06-29 DIAGNOSIS — R262 Difficulty in walking, not elsewhere classified: Secondary | ICD-10-CM | POA: Diagnosis not present

## 2022-06-29 DIAGNOSIS — M6281 Muscle weakness (generalized): Secondary | ICD-10-CM | POA: Diagnosis not present

## 2022-06-30 DIAGNOSIS — R278 Other lack of coordination: Secondary | ICD-10-CM | POA: Diagnosis not present

## 2022-06-30 DIAGNOSIS — M62521 Muscle wasting and atrophy, not elsewhere classified, right upper arm: Secondary | ICD-10-CM | POA: Diagnosis not present

## 2022-06-30 DIAGNOSIS — M6281 Muscle weakness (generalized): Secondary | ICD-10-CM | POA: Diagnosis not present

## 2022-06-30 DIAGNOSIS — R262 Difficulty in walking, not elsewhere classified: Secondary | ICD-10-CM | POA: Diagnosis not present

## 2022-07-01 DIAGNOSIS — R262 Difficulty in walking, not elsewhere classified: Secondary | ICD-10-CM | POA: Diagnosis not present

## 2022-07-01 DIAGNOSIS — R278 Other lack of coordination: Secondary | ICD-10-CM | POA: Diagnosis not present

## 2022-07-01 DIAGNOSIS — M62521 Muscle wasting and atrophy, not elsewhere classified, right upper arm: Secondary | ICD-10-CM | POA: Diagnosis not present

## 2022-07-01 DIAGNOSIS — M6281 Muscle weakness (generalized): Secondary | ICD-10-CM | POA: Diagnosis not present

## 2022-07-02 DIAGNOSIS — R278 Other lack of coordination: Secondary | ICD-10-CM | POA: Diagnosis not present

## 2022-07-02 DIAGNOSIS — R262 Difficulty in walking, not elsewhere classified: Secondary | ICD-10-CM | POA: Diagnosis not present

## 2022-07-02 DIAGNOSIS — M6281 Muscle weakness (generalized): Secondary | ICD-10-CM | POA: Diagnosis not present

## 2022-07-02 DIAGNOSIS — M62521 Muscle wasting and atrophy, not elsewhere classified, right upper arm: Secondary | ICD-10-CM | POA: Diagnosis not present

## 2022-07-05 DIAGNOSIS — R262 Difficulty in walking, not elsewhere classified: Secondary | ICD-10-CM | POA: Diagnosis not present

## 2022-07-05 DIAGNOSIS — R278 Other lack of coordination: Secondary | ICD-10-CM | POA: Diagnosis not present

## 2022-07-05 DIAGNOSIS — M62521 Muscle wasting and atrophy, not elsewhere classified, right upper arm: Secondary | ICD-10-CM | POA: Diagnosis not present

## 2022-07-05 DIAGNOSIS — M6281 Muscle weakness (generalized): Secondary | ICD-10-CM | POA: Diagnosis not present

## 2022-07-06 DIAGNOSIS — M6281 Muscle weakness (generalized): Secondary | ICD-10-CM | POA: Diagnosis not present

## 2022-07-06 DIAGNOSIS — R262 Difficulty in walking, not elsewhere classified: Secondary | ICD-10-CM | POA: Diagnosis not present

## 2022-07-06 DIAGNOSIS — M62521 Muscle wasting and atrophy, not elsewhere classified, right upper arm: Secondary | ICD-10-CM | POA: Diagnosis not present

## 2022-07-06 DIAGNOSIS — R278 Other lack of coordination: Secondary | ICD-10-CM | POA: Diagnosis not present

## 2022-07-07 ENCOUNTER — Other Ambulatory Visit: Payer: Self-pay | Admitting: Adult Health

## 2022-07-07 DIAGNOSIS — R262 Difficulty in walking, not elsewhere classified: Secondary | ICD-10-CM | POA: Diagnosis not present

## 2022-07-07 DIAGNOSIS — M62521 Muscle wasting and atrophy, not elsewhere classified, right upper arm: Secondary | ICD-10-CM | POA: Diagnosis not present

## 2022-07-07 DIAGNOSIS — M6281 Muscle weakness (generalized): Secondary | ICD-10-CM | POA: Diagnosis not present

## 2022-07-07 DIAGNOSIS — G629 Polyneuropathy, unspecified: Secondary | ICD-10-CM

## 2022-07-07 DIAGNOSIS — R278 Other lack of coordination: Secondary | ICD-10-CM | POA: Diagnosis not present

## 2022-07-07 DIAGNOSIS — M159 Polyosteoarthritis, unspecified: Secondary | ICD-10-CM

## 2022-07-07 MED ORDER — PREGABALIN 75 MG PO CAPS: 75.0000 mg | ORAL_CAPSULE | Freq: Every day | ORAL | 0 refills | Status: DC

## 2022-07-08 DIAGNOSIS — M62521 Muscle wasting and atrophy, not elsewhere classified, right upper arm: Secondary | ICD-10-CM | POA: Diagnosis not present

## 2022-07-08 DIAGNOSIS — R262 Difficulty in walking, not elsewhere classified: Secondary | ICD-10-CM | POA: Diagnosis not present

## 2022-07-08 DIAGNOSIS — R278 Other lack of coordination: Secondary | ICD-10-CM | POA: Diagnosis not present

## 2022-07-08 DIAGNOSIS — M6281 Muscle weakness (generalized): Secondary | ICD-10-CM | POA: Diagnosis not present

## 2022-07-10 DIAGNOSIS — M62521 Muscle wasting and atrophy, not elsewhere classified, right upper arm: Secondary | ICD-10-CM | POA: Diagnosis not present

## 2022-07-10 DIAGNOSIS — R278 Other lack of coordination: Secondary | ICD-10-CM | POA: Diagnosis not present

## 2022-07-10 DIAGNOSIS — R262 Difficulty in walking, not elsewhere classified: Secondary | ICD-10-CM | POA: Diagnosis not present

## 2022-07-10 DIAGNOSIS — M6281 Muscle weakness (generalized): Secondary | ICD-10-CM | POA: Diagnosis not present

## 2022-07-13 DIAGNOSIS — R278 Other lack of coordination: Secondary | ICD-10-CM | POA: Diagnosis not present

## 2022-07-13 DIAGNOSIS — R262 Difficulty in walking, not elsewhere classified: Secondary | ICD-10-CM | POA: Diagnosis not present

## 2022-07-13 DIAGNOSIS — M62521 Muscle wasting and atrophy, not elsewhere classified, right upper arm: Secondary | ICD-10-CM | POA: Diagnosis not present

## 2022-07-13 DIAGNOSIS — M6281 Muscle weakness (generalized): Secondary | ICD-10-CM | POA: Diagnosis not present

## 2022-07-14 DIAGNOSIS — R278 Other lack of coordination: Secondary | ICD-10-CM | POA: Diagnosis not present

## 2022-07-14 DIAGNOSIS — R262 Difficulty in walking, not elsewhere classified: Secondary | ICD-10-CM | POA: Diagnosis not present

## 2022-07-14 DIAGNOSIS — M6281 Muscle weakness (generalized): Secondary | ICD-10-CM | POA: Diagnosis not present

## 2022-07-14 DIAGNOSIS — M62521 Muscle wasting and atrophy, not elsewhere classified, right upper arm: Secondary | ICD-10-CM | POA: Diagnosis not present

## 2022-07-15 DIAGNOSIS — R262 Difficulty in walking, not elsewhere classified: Secondary | ICD-10-CM | POA: Diagnosis not present

## 2022-07-15 DIAGNOSIS — M6281 Muscle weakness (generalized): Secondary | ICD-10-CM | POA: Diagnosis not present

## 2022-07-15 DIAGNOSIS — R278 Other lack of coordination: Secondary | ICD-10-CM | POA: Diagnosis not present

## 2022-07-15 DIAGNOSIS — M62521 Muscle wasting and atrophy, not elsewhere classified, right upper arm: Secondary | ICD-10-CM | POA: Diagnosis not present

## 2022-07-16 ENCOUNTER — Non-Acute Institutional Stay: Payer: Medicare Other | Admitting: Family Medicine

## 2022-07-16 DIAGNOSIS — M159 Polyosteoarthritis, unspecified: Secondary | ICD-10-CM | POA: Diagnosis not present

## 2022-07-16 DIAGNOSIS — J4489 Other specified chronic obstructive pulmonary disease: Secondary | ICD-10-CM

## 2022-07-16 DIAGNOSIS — R278 Other lack of coordination: Secondary | ICD-10-CM | POA: Diagnosis not present

## 2022-07-16 DIAGNOSIS — R262 Difficulty in walking, not elsewhere classified: Secondary | ICD-10-CM | POA: Diagnosis not present

## 2022-07-16 DIAGNOSIS — D649 Anemia, unspecified: Secondary | ICD-10-CM | POA: Diagnosis not present

## 2022-07-16 DIAGNOSIS — I10 Essential (primary) hypertension: Secondary | ICD-10-CM

## 2022-07-16 DIAGNOSIS — M62521 Muscle wasting and atrophy, not elsewhere classified, right upper arm: Secondary | ICD-10-CM | POA: Diagnosis not present

## 2022-07-16 DIAGNOSIS — M797 Fibromyalgia: Secondary | ICD-10-CM | POA: Diagnosis not present

## 2022-07-16 DIAGNOSIS — M6281 Muscle weakness (generalized): Secondary | ICD-10-CM | POA: Diagnosis not present

## 2022-07-16 NOTE — Progress Notes (Signed)
Location:      Place of Service:     Provider: Jacalyn Lefevre MD  Code Status:  Goals of Care:     06/18/2022    3:40 PM  Advanced Directives  Does Patient Have a Medical Advance Directive? Yes  Type of Advance Directive Out of facility DNR (pink MOST or yellow form);Living will  Does patient want to make changes to medical advance directive? No - Patient declined  Pre-existing out of facility DNR order (yellow form or pink MOST form) Yellow form placed in chart (order not valid for inpatient use)     No chief complaint on file.   HPI: Patient is a 89 y.o. female seen today for medical management of chronic diseases including anxiety, anemia COPD, degenerative joint disease, and hypertension. She seems tearful this morning she complains of her breathing.  She tells me she only gets 1 neb treatment a day and no other inhalers but direct card says otherwise.  It seems she realizes that she has more limitations than previous and she is sad about that.  She is also said having lost a daughter recently she complains that she is not getting enough prednisone.  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     Fibromyalgia    GERD 12/19/2006   Qualifier: Diagnosis of  By: Renaldo Fiddler CMA, Marliss Czar  04/13/16 TSH 1.20, Na 141, K 4.2, Bun 15, creat 0.79, wbc 5.5, Hgb 11.5, plt 283    GERD (gastroesophageal reflux disease)    Hammer toe of second toe of left foot 04/08/2016   Left 2nd   Hearing loss    Hemorrhoids    Hypercholesterolemia    Hypertension    Osteoarthritis 12/19/2006   Qualifier: Diagnosis of  By: Renaldo Fiddler CMA, Leigh     Osteoporosis     Past Surgical History:  Procedure Laterality Date   BIOPSY  02/17/2020   Procedure: BIOPSY;  Surgeon: Lynann Bologna, MD;  Location: [facility];  Service: Endoscopy;;   CATARACT EXTRACTION, BILATERAL  2009   COLONOSCOPY  2009, 2006 , 2017   ESOPHAGOGASTRODUODENOSCOPY (EGD) WITH PROPOFOL N/A 02/17/2020   Procedure: ESOPHAGOGASTRODUODENOSCOPY (EGD) WITH PROPOFOL;  Surgeon: Lynann Bologna, MD;  Location: [facility];  Service: Endoscopy;  Laterality: N/A;   IR ANGIOGRAM SELECTIVE EACH ADDITIONAL VESSEL  02/17/2020   IR ANGIOGRAM SELECTIVE EACH ADDITIONAL VESSEL  02/17/2020   IR ANGIOGRAM SELECTIVE EACH ADDITIONAL VESSEL  02/17/2020   IR ANGIOGRAM SELECTIVE EACH ADDITIONAL VESSEL  02/17/2020   IR ANGIOGRAM SELECTIVE  EACH ADDITIONAL VESSEL  02/17/2020   IR ANGIOGRAM VISCERAL SELECTIVE  02/17/2020   IR ANGIOGRAM VISCERAL SELECTIVE  02/17/2020   IR US GUIDE VASC ACCESS RIGHT  02/17/2020   stapes surgery     Dr Dorma Russell   TONSILLECTOMY AND ADENOIDECTOMY     as a child    Allergies  Allergen Reactions   Penicillins Anaphylaxis   Bisphosphonates Other (See Comments)    [name]   Influenza Vaccines     Unknown   Simvastatin     Unknown    Trazodone And Nefazodone     Felt her throat was closing up/kept her awake   Sulfonamide Derivatives Rash and Other (See Comments)    blisters    Outpatient Encounter Medications as of 07/16/2022  Medication Sig   acetaminophen (TYLENOL) 325 MG tablet Take 650 mg by mouth every 4 (four)  hours as needed for fever.   albuterol (VENTOLIN HFA) 108 (90 Base) MCG/ACT inhaler Inhale into the lungs every 6 (six) hours as needed for wheezing or shortness of breath.   benzonatate (TESSALON) 100 MG capsule Take 100 mg by mouth 2 (two) times daily.   budesonide-formoterol (SYMBICORT) 80-4.5 MCG/ACT inhaler Inhale 1 puff into the lungs daily.   calcium carbonate (TUMS EX) 750 MG chewable tablet Chew 750 mg by mouth daily.    cholecalciferol (VITAMIN D) 1000 UNITS tablet Take 1,000 Units by mouth daily.   cyclobenzaprine (FLEXERIL) 5 MG tablet Take 2.5 mg by mouth at bedtime.   docusate sodium (COLACE) 100 MG capsule Take 100 mg by mouth 2 (two) times daily.   ferrous sulfate 325 (65 FE) MG tablet Take 325 mg by mouth. Once A Day on Mon, Wed, Fri   furosemide (LASIX) 20 MG tablet Take 20 mg by mouth daily.   hydrocortisone cream 1 % Apply 1 application topically 2 (two) times daily as needed (wrists).   hydrocortisone valerate cream (WESTCORT) 0.2 % Apply 1 application topically 2 (two) times daily as needed (for itching). Cleanse behind ears with gentle cleanser and dry completely.   ipratropium-albuterol (DUONEB) 0.5-2.5 (3) MG/3ML SOLN Take 3 mLs by nebulization daily as needed.   ipratropium-albuterol (DUONEB) 0.5-2.5 (3) MG/3ML SOLN Take 3 mLs by nebulization 2 (two) times daily for 5 days.   ketoconazole (NIZORAL) 2 % cream Apply 1 application  topically 2 (two) times daily as needed for irritation.   metoprolol tartrate (LOPRESSOR) 25 MG tablet Take 12.5 mg by mouth 2 (two) times daily.   nitrofurantoin (MACRODANTIN) 50 MG capsule Take 50 mg by mouth at bedtime.   Olopatadine HCl 0.2 % SOLN Place 1 drop into both eyes daily.   polyethylene glycol (MIRALAX / GLYCOLAX) 17 g packet Take 17 g by mouth 2 (two) times a week. Sunday and Wednesday   pramoxine (SARNA SENSITIVE) 1 % LOTN Apply to cheeks topically one time a day for rosy burning cheeks   predniSONE (DELTASONE) 5 MG tablet Take 5  mg by mouth daily.   pregabalin (LYRICA) 75 MG capsule Take 1 capsule (75 mg total) by mouth at bedtime.   Propylene Glycol (SYSTANE COMPLETE) 0.6 % SOLN Instill 1 drop in both eyes four times a day for DRY EYE   traMADol (ULTRAM) 50 MG tablet Take 0.5 tablets (25 mg total) by mouth every 6 (six) hours as needed.   traMADol (ULTRAM) 50 MG tablet Take 1 tablet (50 mg total) by mouth 3 (three) times daily.   vitamin B-12 (CYANOCOBALAMIN) 1000 MCG tablet  Take 1,000 mcg by mouth daily.   No facility-administered encounter medications on file as of 07/16/2022.    Review of Systems:  Review of Systems  Constitutional: Negative.   Respiratory:  Positive for shortness of breath.   Cardiovascular:  Positive for leg swelling.  Genitourinary: Negative.   Neurological: Negative.   Psychiatric/Behavioral:  Positive for hallucinations. The patient is nervous/anxious.   All other systems reviewed and are negative.   Health Maintenance  Topic Date Due   Pneumonia Vaccine 63+ Years old (3 of 3 - PPSV23 or PCV20) 08/31/2016   DTaP/Tdap/Td (2 - Tdap) 08/22/2019   Medicare Annual Wellness (AWV)  03/02/2023   DEXA SCAN  Completed   HPV VACCINES  Aged Out   INFLUENZA VACCINE  Discontinued   COVID-19 Vaccine  Discontinued   Zoster Vaccines- Shingrix  Discontinued    Physical Exam: There were no vitals filed for this visit. There is no height or weight on file to calculate BMI. Physical Exam Vitals and nursing note reviewed.  Constitutional:      Appearance: Normal appearance.  HENT:     Head: Normocephalic.     Mouth/Throat:     Mouth: Mucous membranes are moist.     Pharynx: Oropharynx is clear.  Eyes:     Conjunctiva/sclera: Conjunctivae normal.     Pupils: Pupils are equal, round, and reactive to light.  Cardiovascular:     Pulses: Normal pulses.     Heart sounds: Normal heart sounds.  Pulmonary:     Effort: Pulmonary effort is normal.     Breath sounds: Normal breath sounds. No  wheezing or rhonchi.  Abdominal:     General: Bowel sounds are normal.     Palpations: Abdomen is soft.  Musculoskeletal:        General: No swelling.     Right lower leg: Edema present.     Left lower leg: Edema present.  Neurological:     General: No focal deficit present.     Mental Status: She is alert and oriented to person, place, and time.  Psychiatric:        Mood and Affect: Mood normal.     Labs reviewed: Basic Metabolic Panel: Recent Labs    04/02/22 0000  NA 141  K 4.5  CL 104  CO2 29*  BUN 19  CREATININE 1.1  CALCIUM 8.7   Liver Function Tests: No results for input(s): "AST", "ALT", "ALKPHOS", "BILITOT", "PROT", "ALBUMIN" in the last 8760 hours. No results for input(s): "LIPASE", "AMYLASE" in the last 8760 hours. No results for input(s): "AMMONIA" in the last 8760 hours. CBC: No results for input(s): "WBC", "NEUTROABS", "HGB", "HCT", "MCV", "PLT" in the last 8760 hours. Lipid Panel: No results for input(s): "CHOL", "HDL", "LDLCALC", "TRIG", "CHOLHDL", "LDLDIRECT" in the last 8760 hours. Lab Results  Component Value Date   HGBA1C 6.0 (H) 03/19/2021    Procedures since last visit: No results found.  Assessment/Plan  1. Chronic anemia Last CBC showed hemoglobin close to normal.  Will follow  2. COPD (chronic obstructive pulmonary disease) with chronic bronchitis Receives combination of low-dose prednisone DuoNeb as needed and Symbicort and as needed albuterol via neb  3. Fibromyalgia Tramadol 50 mg 3 times daily as well as Lyrica  4. Primary hypertension Blood pressure controlled with metoprolol furosemide today pressure 142/65  5. Osteoarthritis of multiple joints, unspecified osteoarthritis type See above same pain medication for fibromyalgia basically   Labs/tests ordered:  * No order type specified * Next  appt:  Visit date not found  Bertram Millard. Hyacinth Meeker, MD [facility] [address] [place] [facility] [place], [place] [address] Office  [phone]

## 2022-07-17 DIAGNOSIS — R262 Difficulty in walking, not elsewhere classified: Secondary | ICD-10-CM | POA: Diagnosis not present

## 2022-07-17 DIAGNOSIS — M6281 Muscle weakness (generalized): Secondary | ICD-10-CM | POA: Diagnosis not present

## 2022-07-17 DIAGNOSIS — R278 Other lack of coordination: Secondary | ICD-10-CM | POA: Diagnosis not present

## 2022-07-17 DIAGNOSIS — M62521 Muscle wasting and atrophy, not elsewhere classified, right upper arm: Secondary | ICD-10-CM | POA: Diagnosis not present

## 2022-07-19 DIAGNOSIS — M62521 Muscle wasting and atrophy, not elsewhere classified, right upper arm: Secondary | ICD-10-CM | POA: Diagnosis not present

## 2022-07-19 DIAGNOSIS — R262 Difficulty in walking, not elsewhere classified: Secondary | ICD-10-CM | POA: Diagnosis not present

## 2022-07-19 DIAGNOSIS — R278 Other lack of coordination: Secondary | ICD-10-CM | POA: Diagnosis not present

## 2022-07-19 DIAGNOSIS — M6281 Muscle weakness (generalized): Secondary | ICD-10-CM | POA: Diagnosis not present

## 2022-07-20 DIAGNOSIS — R278 Other lack of coordination: Secondary | ICD-10-CM | POA: Diagnosis not present

## 2022-07-20 DIAGNOSIS — R262 Difficulty in walking, not elsewhere classified: Secondary | ICD-10-CM | POA: Diagnosis not present

## 2022-07-20 DIAGNOSIS — M62521 Muscle wasting and atrophy, not elsewhere classified, right upper arm: Secondary | ICD-10-CM | POA: Diagnosis not present

## 2022-07-20 DIAGNOSIS — M6281 Muscle weakness (generalized): Secondary | ICD-10-CM | POA: Diagnosis not present

## 2022-07-21 DIAGNOSIS — R278 Other lack of coordination: Secondary | ICD-10-CM | POA: Diagnosis not present

## 2022-07-21 DIAGNOSIS — R262 Difficulty in walking, not elsewhere classified: Secondary | ICD-10-CM | POA: Diagnosis not present

## 2022-07-21 DIAGNOSIS — M6281 Muscle weakness (generalized): Secondary | ICD-10-CM | POA: Diagnosis not present

## 2022-07-21 DIAGNOSIS — M62521 Muscle wasting and atrophy, not elsewhere classified, right upper arm: Secondary | ICD-10-CM | POA: Diagnosis not present

## 2022-07-22 DIAGNOSIS — M62521 Muscle wasting and atrophy, not elsewhere classified, right upper arm: Secondary | ICD-10-CM | POA: Diagnosis not present

## 2022-07-22 DIAGNOSIS — M6281 Muscle weakness (generalized): Secondary | ICD-10-CM | POA: Diagnosis not present

## 2022-07-22 DIAGNOSIS — R262 Difficulty in walking, not elsewhere classified: Secondary | ICD-10-CM | POA: Diagnosis not present

## 2022-07-22 DIAGNOSIS — R278 Other lack of coordination: Secondary | ICD-10-CM | POA: Diagnosis not present

## 2022-07-23 ENCOUNTER — Encounter: Payer: Self-pay | Admitting: Nurse Practitioner

## 2022-07-23 ENCOUNTER — Non-Acute Institutional Stay: Payer: Medicare Other | Admitting: Nurse Practitioner

## 2022-07-23 DIAGNOSIS — M62521 Muscle wasting and atrophy, not elsewhere classified, right upper arm: Secondary | ICD-10-CM | POA: Diagnosis not present

## 2022-07-23 DIAGNOSIS — R600 Localized edema: Secondary | ICD-10-CM | POA: Diagnosis not present

## 2022-07-23 DIAGNOSIS — R262 Difficulty in walking, not elsewhere classified: Secondary | ICD-10-CM | POA: Diagnosis not present

## 2022-07-23 DIAGNOSIS — I63411 Cerebral infarction due to embolism of right middle cerebral artery: Secondary | ICD-10-CM | POA: Diagnosis not present

## 2022-07-23 DIAGNOSIS — K219 Gastro-esophageal reflux disease without esophagitis: Secondary | ICD-10-CM | POA: Diagnosis not present

## 2022-07-23 DIAGNOSIS — D62 Acute posthemorrhagic anemia: Secondary | ICD-10-CM

## 2022-07-23 DIAGNOSIS — J4489 Other specified chronic obstructive pulmonary disease: Secondary | ICD-10-CM | POA: Diagnosis not present

## 2022-07-23 DIAGNOSIS — M549 Dorsalgia, unspecified: Secondary | ICD-10-CM | POA: Diagnosis not present

## 2022-07-23 DIAGNOSIS — I10 Essential (primary) hypertension: Secondary | ICD-10-CM

## 2022-07-23 DIAGNOSIS — M797 Fibromyalgia: Secondary | ICD-10-CM

## 2022-07-23 DIAGNOSIS — M6281 Muscle weakness (generalized): Secondary | ICD-10-CM | POA: Diagnosis not present

## 2022-07-23 DIAGNOSIS — R278 Other lack of coordination: Secondary | ICD-10-CM | POA: Diagnosis not present

## 2022-07-23 DIAGNOSIS — G629 Polyneuropathy, unspecified: Secondary | ICD-10-CM

## 2022-07-23 NOTE — Assessment & Plan Note (Signed)
Hx of Hgb 6.8 in hospital s/p 2 units of PRBC,  Vit B12, Iron, Hgb 11.1 02/12/22, repeat CBC/diff.

## 2022-07-23 NOTE — Assessment & Plan Note (Signed)
on Furosemide, 03/12/22 Echo EF 70%, 06/24/22 Bun/creat 23/1.21

## 2022-07-23 NOTE — Assessment & Plan Note (Signed)
No spinous process tenderness palpated, mostly palpated pain is in the muscle next to the spine, no pain reproduced with deep breathing, will X-ray thoracic spine, R+L ribs, CXR, stated Tramadol has little efficacy, will dc Tramadol, have prn Norco, Robaxin available to her.

## 2022-07-23 NOTE — Progress Notes (Signed)
Location:   [facility] Room Number: 917 Place of Service:  ALF (13) Provider: Arna Snipe Griffith Santilli NP  Joelle Roswell X, NP  Patient Care Team: Cherese Lozano X, NP as PCP - General (Internal Medicine) Lakena Sparlin X, NP as Nurse Practitioner (Internal Medicine)  Extended Emergency Contact Information Primary Emergency Contact: Lynde,Jeff Home Phone: [phone] Relation: Son Secondary Emergency Contact: Culbreth Sr.,Christopher Home Phone: [phone] Relation: Relative  Code Status: DNR Goals of care: Advanced Directive information    06/18/2022    3:40 PM  Advanced Directives  Does Patient Have a Medical Advance Directive? Yes  Type of Advance Directive Out of facility DNR (pink MOST or yellow form);Living will  Does patient want to make changes to medical advance directive? No - Patient declined  Pre-existing out of facility DNR order (yellow form or pink MOST form) Yellow form placed in chart (order not valid for inpatient use)     Chief Complaint  Patient presents with   Acute Visit    Mid back pain.     HPI:  Pt is a 89 y.o. female seen today for an acute visit for c/o mid back pain, new onset, no spinous process tenderness palpated, mostly palpated pain is in the muscle next to the spine, no pain reproduced with deep breathing. The patient denied trauma or injury    Edema BLE, on Furosemide, 03/12/22 Echo EF 70%, 06/24/22 Bun/creat 23/1.21                COPD, intermittent chronic mild expiratory wheezes, c/o SOB, uses O2 more than prior,  takes Symbicort, Zyrtec, Mucinex, DuoNeb, prn Albuterol, cough is chronic, on Prednisone low dose. treated with higher dose of Prednisone for symptomatic control.  Hx of CVA, CT x2 03/2021 showed no acute findings except age indeterminate right corona radiator infarct, small vessel changes. Neurology recommended no further work ups.              Macular degeneration, f/u [facility]: OU for [place] MD involving tcentral area. OS exudative age  related MD             Chronic lower back, leg pain,  R shoulder pain, had Ortho eval, takes Lyrica              HTN, blood pressure is controlled on Metoprolol.              Urinary symptoms, on  Nitrofurantoin 50mg  qd for UTI suppression, Dr. Vernie Ammons Estrace vaginal cream for atrophic vaginitis.              Anemia, Hx of Hgb 6.8 in hospital s/p 2 units of PRBC,  Vit B12, Iron, Hgb 11.1 02/12/22             GERD/chronic gastritis/atrophic gastritis, GI bleed 02/2020, s/p EGD, per biopsy, takes Protonix. F/u GI prn Fibromyalgia takes Lyrica             Tactile hallucinations, off meds. TSH 0.661 03/19/21             Constipation, takes MiraLax 2x/wk. Colace daily.              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     Fibromyalgia    GERD 12/19/2006   Qualifier: Diagnosis of  By: Renaldo Fiddler CMA, Marliss Czar  04/13/16 TSH 1.20, Na 141, K 4.2, Bun 15, creat 0.79, wbc 5.5, Hgb 11.5, plt 283    GERD (gastroesophageal reflux disease)    Hammer toe of second toe of left foot 04/08/2016   Left 2nd   Hearing loss    Hemorrhoids    Hypercholesterolemia    Hypertension    Osteoarthritis 12/19/2006   Qualifier: Diagnosis of  By: Renaldo Fiddler CMA, Leigh     Osteoporosis    Past Surgical History:  Procedure Laterality Date   BIOPSY  02/17/2020    Procedure: BIOPSY;  Surgeon: Lynann Bologna, MD;  Location: [facility];  Service: Endoscopy;;   CATARACT EXTRACTION, BILATERAL  2009   COLONOSCOPY  2009, 2006 , 2017   ESOPHAGOGASTRODUODENOSCOPY (EGD) WITH PROPOFOL N/A 02/17/2020   Procedure: ESOPHAGOGASTRODUODENOSCOPY (EGD) WITH PROPOFOL;  Surgeon: Lynann Bologna, MD;  Location: [facility];  Service: Endoscopy;  Laterality: N/A;   IR ANGIOGRAM SELECTIVE EACH ADDITIONAL VESSEL  02/17/2020   IR ANGIOGRAM SELECTIVE EACH ADDITIONAL VESSEL  02/17/2020   IR ANGIOGRAM SELECTIVE EACH ADDITIONAL VESSEL  02/17/2020   IR ANGIOGRAM SELECTIVE EACH ADDITIONAL VESSEL  02/17/2020   IR ANGIOGRAM SELECTIVE EACH ADDITIONAL VESSEL  02/17/2020   IR ANGIOGRAM VISCERAL SELECTIVE  02/17/2020   IR ANGIOGRAM VISCERAL SELECTIVE  02/17/2020   IR US GUIDE VASC ACCESS RIGHT  02/17/2020   stapes surgery     Dr Dorma Russell   TONSILLECTOMY AND ADENOIDECTOMY     as a child    Allergies  Allergen Reactions   Penicillins Anaphylaxis   Bisphosphonates Other (See Comments)    [name]   Influenza Vaccines     Unknown   Simvastatin     Unknown    Trazodone And Nefazodone     Felt her throat was closing up/kept her awake   Sulfonamide Derivatives Rash and Other (See Comments)    blisters    Allergies as of 07/23/2022       Reactions   Penicillins Anaphylaxis   Bisphosphonates Other (See Comments)   [name]   Influenza Vaccines    Unknown   Simvastatin    Unknown    Trazodone And Nefazodone    Felt her throat was closing up/kept her awake   Sulfonamide Derivatives Rash, Other (See Comments)   blisters        Medication List        Accurate as of July 23, 2022  1:08 PM. If you have any questions, ask your nurse or doctor.          acetaminophen 325 MG tablet Commonly known as: TYLENOL Take 650 mg by mouth every 4 (four) hours as needed for fever.   albuterol 108 (90 Base) MCG/ACT inhaler Commonly known as: VENTOLIN HFA Inhale into the lungs every 6  (six) hours as needed for wheezing  or shortness of breath.   benzonatate 100 MG capsule Commonly known as: TESSALON Take 100 mg by mouth 2 (two) times daily.   budesonide-formoterol 80-4.5 MCG/ACT inhaler Commonly known as: SYMBICORT Inhale 1 puff into the lungs daily.   calcium carbonate 750 MG chewable tablet Commonly known as: TUMS EX Chew 750 mg by mouth daily.   cholecalciferol 1000 units tablet Commonly known as: VITAMIN D Take 1,000 Units by mouth daily.   cyanocobalamin 1000 MCG tablet Commonly known as: VITAMIN B12 Take 1,000 mcg by mouth daily.   cyclobenzaprine 5 MG tablet Commonly known as: FLEXERIL Take 2.5 mg by mouth at bedtime.   docusate sodium 100 MG capsule Commonly known as: COLACE Take 100 mg by mouth 2 (two) times daily.   ferrous sulfate 325 (65 FE) MG tablet Take 325 mg by mouth. Once A Day on Mon, Wed, Fri   furosemide 20 MG tablet Commonly known as: LASIX Take 20 mg by mouth daily.   hydrocortisone cream 1 % Apply 1 application topically 2 (two) times daily as needed (wrists).   hydrocortisone valerate cream 0.2 % Commonly known as: WESTCORT Apply 1 application topically 2 (two) times daily as needed (for itching). Cleanse behind ears with gentle cleanser and dry completely.   ipratropium-albuterol 0.5-2.5 (3) MG/3ML Soln Commonly known as: DUONEB Take 3 mLs by nebulization daily as needed.   ipratropium-albuterol 0.5-2.5 (3) MG/3ML Soln Commonly known as: DUONEB Take 3 mLs by nebulization 2 (two) times daily for 5 days.   ketoconazole 2 % cream Commonly known as: NIZORAL Apply 1 application  topically 2 (two) times daily as needed for irritation.   metoprolol tartrate 25 MG tablet Commonly known as: LOPRESSOR Take 12.5 mg by mouth 2 (two) times daily.   nitrofurantoin 50 MG capsule Commonly known as: MACRODANTIN Take 50 mg by mouth at bedtime.   Olopatadine HCl 0.2 % Soln Place 1 drop into both eyes daily.   polyethylene  glycol 17 g packet Commonly known as: MIRALAX / GLYCOLAX Take 17 g by mouth 2 (two) times a week. Sunday and Wednesday   predniSONE 5 MG tablet Commonly known as: DELTASONE Take 5 mg by mouth daily.   pregabalin 75 MG capsule Commonly known as: LYRICA Take 1 capsule (75 mg total) by mouth at bedtime.   Sarna Sensitive 1 % Lotn Generic drug: pramoxine Apply to cheeks topically one time a day for rosy burning cheeks   Systane Complete 0.6 % Soln Generic drug: Propylene Glycol Instill 1 drop in both eyes four times a day for DRY EYE   traMADol 50 MG tablet Commonly known as: ULTRAM Take 0.5 tablets (25 mg total) by mouth every 6 (six) hours as needed.   traMADol 50 MG tablet Commonly known as: ULTRAM Take 1 tablet (50 mg total) by mouth 3 (three) times daily.        Review of Systems  Constitutional:  Negative for appetite change, fatigue and fever.  HENT:  Positive for hearing loss. Negative for congestion and voice change.   Eyes:  Negative for visual disturbance.       C/o blurred vision, ? Color, ? Right eye peripheral vision loss-f/u Ophthalmology  Respiratory:  Positive for cough and shortness of breath. Negative for chest tightness and wheezing.        DOE, chronic hacking cough  Cardiovascular:  Positive for leg swelling. Negative for chest pain and palpitations.  Gastrointestinal:  Negative for abdominal pain and constipation.  Genitourinary:  Negative for  dysuria and urgency.       Chronic, on and off  Musculoskeletal:  Positive for arthralgias, back pain, gait problem and myalgias. Negative for joint swelling.       New mid back pain, mostly in the muscle next to thoracic spine palpated.   Skin:  Negative for color change.       RLE bruise  Neurological:  Negative for speech difficulty, weakness and light-headedness.  Psychiatric/Behavioral:  Negative for behavioral problems, hallucinations and sleep disturbance.        Chronic going to bed late, sleeping in  late. Lethargic upon my examination.     Immunization History  Administered Date(s) Administered   Moderna SARS-COV2 Booster Vaccination 12/25/2019   Moderna Sars-Covid-2 Vaccination 02/17/2019, 03/17/2019   Pneumococcal Conjugate-13 09/01/2015   Pneumococcal Polysaccharide-23 07/07/1998   Td 08/21/2009   Pertinent  Health Maintenance Due  Topic Date Due   DEXA SCAN  Completed   INFLUENZA VACCINE  Discontinued      03/19/2021    8:00 AM 03/20/2021   12:00 AM 03/20/2021   12:00 PM 02/05/2022    9:16 AM 03/09/2022   10:01 AM  Fall Risk  Falls in the past year?    0 0  Was there an injury with Fall?    0 0  Fall Risk Category Calculator    0 0  Fall Risk Category (Retired)    Low   (RETIRED) Patient Fall Risk Level High fall risk High fall risk High fall risk High fall risk   Patient at Risk for Falls Due to    History of fall(s) No Fall Risks  Fall risk Follow up    Falls evaluation completed Falls evaluation completed   Functional Status Survey:    Vitals:   07/23/22 1257  BP: (!) 142/56  Pulse: 68  Resp: 20  Temp: (!) 96.8 F (36 C)  SpO2: 93%  Weight: 166 lb (75.3 kg)   Body mass index is 31.37 kg/m. Physical Exam Vitals and nursing note reviewed.  Constitutional:      Appearance: Normal appearance.  HENT:     Head: Normocephalic and atraumatic.     Nose: Nose normal.     Mouth/Throat:     Mouth: Mucous membranes are moist.  Eyes:     Extraocular Movements: Extraocular movements intact.     Conjunctiva/sclera: Conjunctivae normal.     Pupils: Pupils are equal, round, and reactive to light.     Comments: Able to count my fingers 3 feet away from her eyes.   Cardiovascular:     Rate and Rhythm: Normal rate and regular rhythm.     Heart sounds: No murmur heard. Pulmonary:     Effort: Pulmonary effort is normal.     Breath sounds: Rales present. No wheezing.     Comments: Decreased air entry to both lungs. Needs O2 via [place] to maintain SatO2>90%. Moist rales  bibasilar.  Chest:     Chest wall: No tenderness.  Abdominal:     General: Bowel sounds are normal.     Palpations: Abdomen is soft.     Tenderness: There is no abdominal tenderness.  Musculoskeletal:        General: Tenderness present.     Cervical back: Normal range of motion and neck supple.     Right lower leg: Edema present.     Left lower leg: Edema present.     Comments: 2+ edema BLE. R shoulder pain with overhead ROM. New mid  back pain, mostly in the muscle next to thoracic spine palpated.   Skin:    General: Skin is warm and dry.     Findings: Bruising present.     Comments: LLE  Neurological:     General: No focal deficit present.     Mental Status: She is alert. Mental status is at baseline.     Motor: No weakness.     Coordination: Coordination normal.     Gait: Gait abnormal.     Comments: Oriented to person, place.   Psychiatric:        Mood and Affect: Mood normal.        Behavior: Behavior normal.     Labs reviewed: Recent Labs    04/02/22 0000  NA 141  K 4.5  CL 104  CO2 29*  BUN 19  CREATININE 1.1  CALCIUM 8.7   No results for input(s): "AST", "ALT", "ALKPHOS", "BILITOT", "PROT", "ALBUMIN" in the last 8760 hours. No results for input(s): "WBC", "NEUTROABS", "HGB", "HCT", "MCV", "PLT" in the last 8760 hours. Lab Results  Component Value Date   TSH 0.661 03/19/2021   Lab Results  Component Value Date   HGBA1C 6.0 (H) 03/19/2021   Lab Results  Component Value Date   CHOL 192 03/19/2021   HDL 69 03/19/2021   LDLCALC 107 (H) 03/19/2021   LDLDIRECT 114.7 08/18/2011   TRIG 78 03/19/2021   CHOLHDL 2.8 03/19/2021    Significant Diagnostic Results in last 30 days:  No results found.  Assessment/Plan: Mid back pain No spinous process tenderness palpated, mostly palpated pain is in the muscle next to the spine, no pain reproduced with deep breathing, will X-ray thoracic spine, R+L ribs, CXR, stated Tramadol has little efficacy, will dc  Tramadol, have prn Norco, Robaxin available to her.   Edema, peripheral on Furosemide, 03/12/22 Echo EF 70%, 06/24/22 Bun/creat 23/1.21  COPD (chronic obstructive pulmonary disease) with chronic bronchitis intermittent chronic mild expiratory wheezes, c/o SOB, uses O2 more than prior,  takes Symbicort, Zyrtec, Mucinex, DuoNeb, prn Albuterol, cough is chronic, on Prednisone low dose. treated with higher dose of Prednisone for symptomatic control.   CVA (cerebral vascular accident) [facility]) CT x2 03/2021 showed no acute findings except age indeterminate right corona radiator infarct, small vessel changes. Neurology recommended no further work ups.  Peripheral neuropathy Chronic lower back, leg pain,  R shoulder pain, had Ortho eval, takes Lyrica   HTN (hypertension)  blood pressure is controlled on Metoprolol.   ABLA (acute blood loss anemia)  Hx of Hgb 6.8 in hospital s/p 2 units of PRBC,  Vit B12, Iron, Hgb 11.1 02/12/22, repeat CBC/diff.   GERD  GERD/chronic gastritis/atrophic gastritis, GI bleed 02/2020, s/p EGD, per biopsy, takes Protonix. F/u GI prn  Fibromyalgia Fibromyalgia takes Lyrica    Family/ staff Communication: plan of care reviewed with the patient and charge nurse.   Labs/tests ordered:  X-ray thoracic spine, R+L ribs, CXR  Time spend 40 minutes.

## 2022-07-23 NOTE — Assessment & Plan Note (Signed)
intermittent chronic mild expiratory wheezes, c/o SOB, uses O2 more than prior,  takes Symbicort, Zyrtec, Mucinex, DuoNeb, prn Albuterol, cough is chronic, on Prednisone low dose. treated with higher dose of Prednisone for symptomatic control.  

## 2022-07-23 NOTE — Progress Notes (Unsigned)
Location:  [facility] Room Number: [id number] Place of Service:  ALF (13) Provider:  Daun Rens X, NP  Patient Care Team: Faith Branan X, NP as PCP - General (Internal Medicine) Kalese Ensz X, NP as Nurse Practitioner (Internal Medicine)  Extended Emergency Contact Information Primary Emergency Contact: Mikus,Jeff Home Phone: [phone] Relation: Son Secondary Emergency Contact: Culbreth Sr.,Christopher Home Phone: [phone] Relation: Relative  Code Status:  DNR Goals of care: Advanced Directive information    06/18/2022    3:40 PM  Advanced Directives  Does Patient Have a Medical Advance Directive? Yes  Type of Advance Directive Out of facility DNR (pink MOST or yellow form);Living will  Does patient want to make changes to medical advance directive? No - Patient declined  Pre-existing out of facility DNR order (yellow form or pink MOST form) Yellow form placed in chart (order not valid for inpatient use)     Chief Complaint  Patient presents with   Acute Visit    Patient is being seen for mild back     HPI:  Pt is a 89 y.o. 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     Fibromyalgia    GERD 12/19/2006   Qualifier: Diagnosis of  By: Renaldo Fiddler CMA, Marliss Czar  04/13/16 TSH 1.20, Na 141, K 4.2, Bun 15, creat 0.79, wbc 5.5,  Hgb 11.5, plt 283    GERD (gastroesophageal reflux disease)    Hammer toe of second toe of left foot 04/08/2016   Left 2nd   Hearing loss    Hemorrhoids    Hypercholesterolemia    Hypertension    Osteoarthritis 12/19/2006   Qualifier: Diagnosis of  By: Renaldo Fiddler CMA, Leigh     Osteoporosis    Past Surgical History:  Procedure Laterality Date   BIOPSY  02/17/2020   Procedure: BIOPSY;  Surgeon: Lynann Bologna, MD;  Location: [facility];  Service: Endoscopy;;   CATARACT EXTRACTION, BILATERAL  2009   COLONOSCOPY  2009, 2006 , 2017   ESOPHAGOGASTRODUODENOSCOPY (EGD) WITH PROPOFOL N/A 02/17/2020   Procedure: ESOPHAGOGASTRODUODENOSCOPY (EGD) WITH PROPOFOL;  Surgeon: Lynann Bologna, MD;  Location: [facility];  Service: Endoscopy;  Laterality: N/A;   IR ANGIOGRAM SELECTIVE EACH ADDITIONAL VESSEL  02/17/2020   IR ANGIOGRAM SELECTIVE EACH ADDITIONAL VESSEL  02/17/2020   IR ANGIOGRAM SELECTIVE EACH ADDITIONAL VESSEL  02/17/2020   IR ANGIOGRAM SELECTIVE EACH ADDITIONAL VESSEL  02/17/2020   IR ANGIOGRAM SELECTIVE EACH ADDITIONAL VESSEL  02/17/2020   IR ANGIOGRAM VISCERAL SELECTIVE  02/17/2020   IR ANGIOGRAM VISCERAL SELECTIVE  02/17/2020   IR US GUIDE VASC ACCESS RIGHT  02/17/2020   stapes surgery     Dr Dorma Russell   TONSILLECTOMY AND ADENOIDECTOMY  as a child    Allergies  Allergen Reactions   Penicillins Anaphylaxis   Bisphosphonates Other (See Comments)    [name]   Influenza Vaccines     Unknown   Simvastatin     Unknown    Trazodone And Nefazodone     Felt her throat was closing up/kept her awake   Sulfonamide Derivatives Rash and Other (See Comments)    blisters    Outpatient Encounter Medications as of 07/23/2022  Medication Sig   acetaminophen (TYLENOL) 325 MG tablet Take 650 mg by mouth every 4 (four) hours as needed for fever.   albuterol (VENTOLIN HFA) 108 (90 Base) MCG/ACT inhaler Inhale into the lungs every 6 (six) hours as needed for wheezing or shortness of breath.   benzonatate  (TESSALON) 100 MG capsule Take 100 mg by mouth 2 (two) times daily.   budesonide-formoterol (SYMBICORT) 80-4.5 MCG/ACT inhaler Inhale 1 puff into the lungs daily.   calcium carbonate (TUMS EX) 750 MG chewable tablet Chew 750 mg by mouth daily.    cholecalciferol (VITAMIN D) 1000 UNITS tablet Take 1,000 Units by mouth daily.   cyclobenzaprine (FLEXERIL) 5 MG tablet Take 2.5 mg by mouth at bedtime.   docusate sodium (COLACE) 100 MG capsule Take 100 mg by mouth 2 (two) times daily.   ferrous sulfate 325 (65 FE) MG tablet Take 325 mg by mouth. Once A Day on Mon, Wed, Fri   furosemide (LASIX) 20 MG tablet Take 20 mg by mouth daily.   hydrocortisone cream 1 % Apply 1 application topically 2 (two) times daily as needed (wrists).   hydrocortisone valerate cream (WESTCORT) 0.2 % Apply 1 application topically 2 (two) times daily as needed (for itching). Cleanse behind ears with gentle cleanser and dry completely.   ipratropium-albuterol (DUONEB) 0.5-2.5 (3) MG/3ML SOLN Take 3 mLs by nebulization daily as needed.   ketoconazole (NIZORAL) 2 % cream Apply 1 application  topically 2 (two) times daily as needed for irritation.   metoprolol tartrate (LOPRESSOR) 25 MG tablet Take 12.5 mg by mouth 2 (two) times daily.   nitrofurantoin (MACRODANTIN) 50 MG capsule Take 50 mg by mouth at bedtime.   Olopatadine HCl 0.2 % SOLN Place 1 drop into both eyes daily.   polyethylene glycol (MIRALAX / GLYCOLAX) 17 g packet Take 17 g by mouth 2 (two) times a week. Sunday and Wednesday   pramoxine (SARNA SENSITIVE) 1 % LOTN Apply to cheeks topically one time a day for rosy burning cheeks   predniSONE (DELTASONE) 5 MG tablet Take 5 mg by mouth daily.   pregabalin (LYRICA) 75 MG capsule Take 1 capsule (75 mg total) by mouth at bedtime.   Propylene Glycol (SYSTANE COMPLETE) 0.6 % SOLN Instill 1 drop in both eyes four times a day for DRY EYE   traMADol (ULTRAM) 50 MG tablet Take 0.5 tablets (25 mg total) by mouth every 6 (six) hours  as needed.   traMADol (ULTRAM) 50 MG tablet Take 1 tablet (50 mg total) by mouth 3 (three) times daily.   vitamin B-12 (CYANOCOBALAMIN) 1000 MCG tablet Take 1,000 mcg by mouth daily.   ipratropium-albuterol (DUONEB) 0.5-2.5 (3) MG/3ML SOLN Take 3 mLs by nebulization 2 (two) times daily for 5 days.   No facility-administered encounter medications on file as of 07/23/2022.    Review of Systems  Immunization History  Administered Date(s) Administered   Moderna SARS-COV2 Booster Vaccination 12/25/2019   Moderna Sars-Covid-2 Vaccination 02/17/2019, 03/17/2019   Pneumococcal Conjugate-13 09/01/2015  Pneumococcal Polysaccharide-23 07/07/1998   Td 08/21/2009   Pertinent  Health Maintenance Due  Topic Date Due   DEXA SCAN  Completed   INFLUENZA VACCINE  Discontinued      03/20/2021   12:00 AM 03/20/2021   12:00 PM 02/05/2022    9:16 AM 03/09/2022   10:01 AM 07/23/2022    3:44 PM  Fall Risk  Falls in the past year?   0 0 0  Was there an injury with Fall?   0 0 0  Fall Risk Category Calculator   0 0 0  Fall Risk Category (Retired)   Low    (RETIRED) Patient Fall Risk Level High fall risk High fall risk High fall risk    Patient at Risk for Falls Due to   History of fall(s) No Fall Risks No Fall Risks  Fall risk Follow up   Falls evaluation completed Falls evaluation completed Falls evaluation completed   Functional Status Survey:    Vitals:   07/23/22 1540 07/24/22 1508  BP: (!) 145/56 132/87  Pulse: 68   Resp: (!) 22   Temp: (!) 96.8 F (36 C)   SpO2: 93%   Weight: 166 lb (75.3 kg)   Height: 5\' 1"  (1.549 m)    Body mass index is 31.37 kg/m. Physical Exam  Labs reviewed: Recent Labs    04/02/22 0000  NA 141  K 4.5  CL 104  CO2 29*  BUN 19  CREATININE 1.1  CALCIUM 8.7   No results for input(s): "AST", "ALT", "ALKPHOS", "BILITOT", "PROT", "ALBUMIN" in the last 8760 hours. No results for input(s): "WBC", "NEUTROABS", "HGB", "HCT", "MCV", "PLT" in the last 8760  hours. Lab Results  Component Value Date   TSH 0.661 03/19/2021   Lab Results  Component Value Date   HGBA1C 6.0 (H) 03/19/2021   Lab Results  Component Value Date   CHOL 192 03/19/2021   HDL 69 03/19/2021   LDLCALC 107 (H) 03/19/2021   LDLDIRECT 114.7 08/18/2011   TRIG 78 03/19/2021   CHOLHDL 2.8 03/19/2021    Significant Diagnostic Results in last 30 days:  No results found.  Assessment/Plan There are no diagnoses linked to this encounter.   Family/ staff Communication:   Labs/tests ordered:   This encounter was created in error - please disregard.

## 2022-07-23 NOTE — Assessment & Plan Note (Signed)
Fibromyalgia takes Lyrica  

## 2022-07-23 NOTE — Assessment & Plan Note (Signed)
CT x2 03/2021 showed no acute findings except age indeterminate right corona radiator infarct, small vessel changes. Neurology recommended no further work ups. 

## 2022-07-23 NOTE — Assessment & Plan Note (Signed)
blood pressure is controlled on Metoprolol 

## 2022-07-23 NOTE — Assessment & Plan Note (Signed)
Chronic lower back, leg pain,  R shoulder pain, had Ortho eval, takes Lyrica

## 2022-07-23 NOTE — Assessment & Plan Note (Signed)
GERD/chronic gastritis/atrophic gastritis, GI bleed 02/2020, s/p EGD, per biopsy, takes Protonix. F/u GI prn ?

## 2022-07-26 DIAGNOSIS — M6281 Muscle weakness (generalized): Secondary | ICD-10-CM | POA: Diagnosis not present

## 2022-07-26 DIAGNOSIS — R262 Difficulty in walking, not elsewhere classified: Secondary | ICD-10-CM | POA: Diagnosis not present

## 2022-07-26 DIAGNOSIS — R278 Other lack of coordination: Secondary | ICD-10-CM | POA: Diagnosis not present

## 2022-07-26 DIAGNOSIS — M62521 Muscle wasting and atrophy, not elsewhere classified, right upper arm: Secondary | ICD-10-CM | POA: Diagnosis not present

## 2022-07-27 DIAGNOSIS — D649 Anemia, unspecified: Secondary | ICD-10-CM | POA: Diagnosis not present

## 2022-07-27 DIAGNOSIS — M62521 Muscle wasting and atrophy, not elsewhere classified, right upper arm: Secondary | ICD-10-CM | POA: Diagnosis not present

## 2022-07-27 DIAGNOSIS — M6281 Muscle weakness (generalized): Secondary | ICD-10-CM | POA: Diagnosis not present

## 2022-07-27 DIAGNOSIS — R278 Other lack of coordination: Secondary | ICD-10-CM | POA: Diagnosis not present

## 2022-07-27 DIAGNOSIS — I10 Essential (primary) hypertension: Secondary | ICD-10-CM | POA: Diagnosis not present

## 2022-07-27 DIAGNOSIS — R262 Difficulty in walking, not elsewhere classified: Secondary | ICD-10-CM | POA: Diagnosis not present

## 2022-07-27 LAB — CBC AND DIFFERENTIAL
HCT: 37 (ref 36–46)
Hemoglobin: 12.3 (ref 12.0–16.0)
Platelets: 194 10*3/uL (ref 150–400)
WBC: 8.1

## 2022-07-27 LAB — CBC: RBC: 4.11 (ref 3.87–5.11)

## 2022-07-28 DIAGNOSIS — M6281 Muscle weakness (generalized): Secondary | ICD-10-CM | POA: Diagnosis not present

## 2022-07-28 DIAGNOSIS — R278 Other lack of coordination: Secondary | ICD-10-CM | POA: Diagnosis not present

## 2022-07-28 DIAGNOSIS — R262 Difficulty in walking, not elsewhere classified: Secondary | ICD-10-CM | POA: Diagnosis not present

## 2022-07-28 DIAGNOSIS — M62521 Muscle wasting and atrophy, not elsewhere classified, right upper arm: Secondary | ICD-10-CM | POA: Diagnosis not present

## 2022-07-29 DIAGNOSIS — R262 Difficulty in walking, not elsewhere classified: Secondary | ICD-10-CM | POA: Diagnosis not present

## 2022-07-29 DIAGNOSIS — M6281 Muscle weakness (generalized): Secondary | ICD-10-CM | POA: Diagnosis not present

## 2022-07-29 DIAGNOSIS — R278 Other lack of coordination: Secondary | ICD-10-CM | POA: Diagnosis not present

## 2022-07-29 DIAGNOSIS — M62521 Muscle wasting and atrophy, not elsewhere classified, right upper arm: Secondary | ICD-10-CM | POA: Diagnosis not present

## 2022-07-31 ENCOUNTER — Encounter (HOSPITAL_COMMUNITY): Payer: Self-pay

## 2022-07-31 ENCOUNTER — Emergency Department (HOSPITAL_COMMUNITY): Payer: Medicare Other

## 2022-07-31 ENCOUNTER — Emergency Department (HOSPITAL_COMMUNITY)
Admission: EM | Admit: 2022-07-31 | Discharge: 2022-07-31 | Disposition: A | Discharge status: Home / Self Care | Payer: Medicare Other | Attending: Emergency Medicine | Admitting: Emergency Medicine

## 2022-07-31 DIAGNOSIS — R4182 Altered mental status, unspecified: Secondary | ICD-10-CM

## 2022-07-31 DIAGNOSIS — R0902 Hypoxemia: Secondary | ICD-10-CM | POA: Diagnosis not present

## 2022-07-31 DIAGNOSIS — J441 Chronic obstructive pulmonary disease with (acute) exacerbation: Secondary | ICD-10-CM | POA: Diagnosis not present

## 2022-07-31 DIAGNOSIS — R410 Disorientation, unspecified: Secondary | ICD-10-CM | POA: Diagnosis not present

## 2022-07-31 DIAGNOSIS — R0602 Shortness of breath: Secondary | ICD-10-CM | POA: Diagnosis not present

## 2022-07-31 DIAGNOSIS — Z1152 Encounter for screening for COVID-19: Secondary | ICD-10-CM | POA: Insufficient documentation

## 2022-07-31 DIAGNOSIS — R404 Transient alteration of awareness: Secondary | ICD-10-CM | POA: Diagnosis not present

## 2022-07-31 DIAGNOSIS — I959 Hypotension, unspecified: Secondary | ICD-10-CM | POA: Diagnosis not present

## 2022-07-31 DIAGNOSIS — I499 Cardiac arrhythmia, unspecified: Secondary | ICD-10-CM | POA: Diagnosis not present

## 2022-07-31 LAB — COMPREHENSIVE METABOLIC PANEL
ALT: 19 U/L (ref 0–44)
AST: 20 U/L (ref 15–41)
Albumin: 3.6 g/dL (ref 3.5–5.0)
Alkaline Phosphatase: 78 U/L (ref 38–126)
Anion gap: 10 (ref 5–15)
BUN: 16 mg/dL (ref 8–23)
CO2: 27 mmol/L (ref 22–32)
Calcium: 9.1 mg/dL (ref 8.9–10.3)
Chloride: 100 mmol/L (ref 98–111)
Creatinine, Ser: 1.25 mg/dL — ABNORMAL HIGH (ref 0.44–1.00)
GFR, Estimated: 41 mL/min — ABNORMAL LOW (ref >60)
Glucose, Bld: 112 mg/dL — ABNORMAL HIGH (ref 70–99)
Potassium: 3.6 mmol/L (ref 3.5–5.1)
Sodium: 137 mmol/L (ref 135–145)
Total Bilirubin: 1.2 mg/dL (ref 0.3–1.2)
Total Protein: 6.1 g/dL — ABNORMAL LOW (ref 6.5–8.1)

## 2022-07-31 LAB — BRAIN NATRIURETIC PEPTIDE: B Natriuretic Peptide: 394.9 pg/mL — ABNORMAL HIGH (ref 0.0–100.0)

## 2022-07-31 LAB — CBC
HCT: 41.6 % (ref 36.0–46.0)
Hemoglobin: 13.3 g/dL (ref 12.0–15.0)
MCH: 30.4 pg (ref 26.0–34.0)
MCHC: 32 g/dL (ref 30.0–36.0)
MCV: 95.2 fL (ref 80.0–100.0)
Platelets: 179 10*3/uL (ref 150–400)
RBC: 4.37 MIL/uL (ref 3.87–5.11)
RDW: 14.1 % (ref 11.5–15.5)
WBC: 7 10*3/uL (ref 4.0–10.5)
nRBC: 0 % (ref 0.0–0.2)

## 2022-07-31 LAB — SARS CORONAVIRUS 2 BY RT PCR: SARS Coronavirus 2 by RT PCR: NEGATIVE

## 2022-07-31 MED ORDER — ALBUTEROL SULFATE (2.5 MG/3ML) 0.083% IN NEBU: 5.0000 mg | INHALATION_SOLUTION | Freq: Once | RESPIRATORY_TRACT | Status: DC

## 2022-07-31 MED ORDER — IPRATROPIUM-ALBUTEROL 0.5-2.5 (3) MG/3ML IN SOLN
6.0000 mL | Freq: Once | RESPIRATORY_TRACT | Status: AC
  Administered 2022-07-31: 6 mL via RESPIRATORY_TRACT
  Filled 2022-07-31: qty 3

## 2022-07-31 MED ORDER — METHYLPREDNISOLONE SODIUM SUCC 125 MG IJ SOLR
125.0000 mg | Freq: Once | INTRAMUSCULAR | Status: AC
  Administered 2022-07-31: 125 mg via INTRAVENOUS
  Filled 2022-07-31: qty 2

## 2022-07-31 MED ORDER — PREDNISONE 10 MG PO TABS: 40.0000 mg | ORAL_TABLET | Freq: Every day | ORAL | 0 refills | Status: DC

## 2022-07-31 NOTE — ED Notes (Signed)
Attempted report to [facility], called twice, left voicemail. No call return. Family attempted to call and left voicemail. Family received a call back and approval to transport pt back home. RN did not receive phone call back.

## 2022-07-31 NOTE — ED Provider Notes (Signed)
[place] EMERGENCY DEPARTMENT AT [facility] Provider Note   CSN: [id number] Arrival date & time: 07/31/22  1421     History  Chief Complaint  Patient presents with   Altered Mental Status   Shortness of Breath    Lisa King is a 89 y.o. female.  Pt with hx copd, c/o increased sob, decreased responsiveness.  EMS indicates pt initially not following commands, pulse ox 88% on 2 liter sns (pt uses home o2 2 liters), and was wheezing. EMS increased o2, gave albuterol neb treatments with improvement in breathing and responsiveness. Pt limited historian - level 5 caveat. Notes recent non prod cough. No sore throat. No fever/chills. Denies chest pain or discomfort. No abd pain or nvd. No dysuria or gu c/o. Bilateral lower leg swelling which patient indicates is not unusual for her. Pt unaware of any recent change in meds.  (Patient and paperwork/MOST form indicate pt wishes are for DNR, medical treatment/admission/meds/etc are ok, but no intubation or cpr).  The history is provided by the patient, the EMS personnel and medical records. The history is limited by the condition of the patient.  Altered Mental Status Associated symptoms: no abdominal pain, no fever, no headaches, no rash and no vomiting   Shortness of Breath Associated symptoms: cough and wheezing   Associated symptoms: no abdominal pain, no chest pain, no fever, no headaches, no neck pain, no rash, no sore throat and no vomiting        Home Medications Prior to Admission medications   Medication Sig Start Date End Date Taking? Authorizing Provider  acetaminophen (TYLENOL) 325 MG tablet Take 650 mg by mouth every 4 (four) hours as needed for fever.    [provider]  albuterol (VENTOLIN HFA) 108 (90 Base) MCG/ACT inhaler Inhale into the lungs every 6 (six) hours as needed for wheezing or shortness of breath.    [provider]  benzonatate (TESSALON) 100 MG capsule Take 100 mg by mouth 2  (two) times daily.    [provider]  budesonide-formoterol (SYMBICORT) 80-4.5 MCG/ACT inhaler Inhale 1 puff into the lungs daily.    [provider]  calcium carbonate (TUMS EX) 750 MG chewable tablet Chew 750 mg by mouth daily.     [provider]  cholecalciferol (VITAMIN D) 1000 UNITS tablet Take 1,000 Units by mouth daily.    [provider]  cyclobenzaprine (FLEXERIL) 5 MG tablet Take 2.5 mg by mouth at bedtime.    [provider]  docusate sodium (COLACE) 100 MG capsule Take 100 mg by mouth 2 (two) times daily.    [provider]  ferrous sulfate 325 (65 FE) MG tablet Take 325 mg by mouth. Once A Day on Mon, Wed, Fri    [provider]  furosemide (LASIX) 20 MG tablet Take 20 mg by mouth daily.    [provider]  hydrocortisone cream 1 % Apply 1 application topically 2 (two) times daily as needed (wrists).    [provider]  hydrocortisone valerate cream (WESTCORT) 0.2 % Apply 1 application topically 2 (two) times daily as needed (for itching). Cleanse behind ears with gentle cleanser and dry completely.    [provider]  ipratropium-albuterol (DUONEB) 0.5-2.5 (3) MG/3ML SOLN Take 3 mLs by nebulization daily as needed. 06/17/21   Fargo, Amy E, NP  ipratropium-albuterol (DUONEB) 0.5-2.5 (3) MG/3ML SOLN Take 3 mLs by nebulization 2 (two) times daily for 5 days. 02/05/22 02/10/22  Octavia Heir,  NP  ketoconazole (NIZORAL) 2 % cream Apply 1 application  topically 2 (two) times daily as needed for irritation.    [provider]  metoprolol tartrate (LOPRESSOR) 25 MG tablet Take 12.5 mg by mouth 2 (two) times daily.    [provider]  nitrofurantoin (MACRODANTIN) 50 MG capsule Take 50 mg by mouth at bedtime.    [provider]  Olopatadine HCl 0.2 % SOLN Place 1 drop into both eyes daily.    [provider]  polyethylene glycol (MIRALAX / GLYCOLAX) 17 g packet Take 17 g by  mouth 2 (two) times a week. Sunday and Wednesday    [provider]  pramoxine (SARNA SENSITIVE) 1 % LOTN Apply to cheeks topically one time a day for rosy burning cheeks    [provider]  predniSONE (DELTASONE) 5 MG tablet Take 5 mg by mouth daily.    [provider]  pregabalin (LYRICA) 75 MG capsule Take 1 capsule (75 mg total) by mouth at bedtime. 07/07/22   Medina-Vargas, Monina C, NP  Propylene Glycol (SYSTANE COMPLETE) 0.6 % SOLN Instill 1 drop in both eyes four times a day for DRY EYE    [provider]  traMADol (ULTRAM) 50 MG tablet Take 0.5 tablets (25 mg total) by mouth every 6 (six) hours as needed. 04/14/22   Medina-Vargas, Monina C, NP  traMADol (ULTRAM) 50 MG tablet Take 1 tablet (50 mg total) by mouth 3 (three) times daily. 06/14/22   Medina-Vargas, Monina C, NP  vitamin B-12 (CYANOCOBALAMIN) 1000 MCG tablet Take 1,000 mcg by mouth daily.    [provider]      Allergies    Penicillins, Bisphosphonates, Influenza vaccines, Simvastatin, Trazodone and nefazodone, and Sulfonamide derivatives    Review of Systems   Review of Systems  Constitutional:  Negative for chills and fever.  HENT:  Negative for sore throat.   Eyes:  Negative for redness.  Respiratory:  Positive for cough, shortness of breath and wheezing.   Cardiovascular:  Negative for chest pain.  Gastrointestinal:  Negative for abdominal pain, diarrhea and vomiting.  Genitourinary:  Negative for dysuria and flank pain.  Musculoskeletal:  Negative for back pain and neck pain.  Skin:  Negative for rash.  Neurological:  Negative for headaches.    Physical Exam Updated Vital Signs BP (!) 142/59   Pulse (!) 54   Temp (!) 97.5 F (36.4 C) (Oral)   Resp 16   SpO2 100%  Physical Exam Vitals and nursing note reviewed.  Constitutional:      Appearance: Normal appearance. She is well-developed.  HENT:     Head: Atraumatic.     Nose: Nose normal.     Mouth/Throat:      Mouth: Mucous membranes are moist.  Eyes:     General: No scleral icterus.    Conjunctiva/sclera: Conjunctivae normal.     Pupils: Pupils are equal, round, and reactive to light.  Neck:     Trachea: No tracheal deviation.     Comments: No stiffness or rigidity Cardiovascular:     Rate and Rhythm: Normal rate and regular rhythm.     Pulses: Normal pulses.     Heart sounds: Normal heart sounds. No murmur heard.    No friction rub. No gallop.  Pulmonary:     Effort: Pulmonary effort is normal. No respiratory distress.     Breath sounds: Wheezing present.  Abdominal:     General: Bowel sounds are normal. There is no  distension.     Palpations: Abdomen is soft.     Tenderness: There is no abdominal tenderness. There is no guarding.  Genitourinary:    Comments: No cva tenderness.  Musculoskeletal:        General: No tenderness.     Cervical back: Normal range of motion and neck supple. No rigidity. No muscular tenderness.     Comments: Mild symmetric bilateral lower leg edema.   Skin:    General: Skin is warm and dry.     Findings: No rash.  Neurological:     Mental Status: She is alert.     Comments: Alert, speech normal. Motor/sens grossly intact bil.   Psychiatric:        Mood and Affect: Mood normal.     ED Results / Procedures / Treatments   Labs (all labs ordered are listed, but only abnormal results are displayed) Results for orders placed or performed in visit on 06/18/22  Basic metabolic panel  Result Value Ref Range   Glucose 84    BUN 19 4 - 21   CO2 29 (A) 13 - 22   Creatinine 1.1 0.5 - 1.1   Potassium 4.5 3.5 - 5.1 mEq/L   Sodium 141 137 - 147   Chloride 104 99 - 108  Comprehensive metabolic panel  Result Value Ref Range   eGFR 50    Calcium 8.7 8.7 - 10.7      EKG EKG Interpretation  Date/Time:  Saturday July 31 2022 14:30:13 EDT Ventricular Rate:  65 PR Interval:  185 QRS Duration: 95 QT Interval:  426 QTC Calculation: 443 R  Axis:   233 Text Interpretation: Sinus rhythm Atrial premature complexes Consider left ventricular hypertrophy Non-specific ST-t changes Confirmed by Cathren Laine ([id number]) on 07/31/2022 2:43:22 PM  Radiology No results found.  Procedures Procedures    Medications Ordered in ED Medications  methylPREDNISolone sodium succinate (SOLU-MEDROL) 125 mg/2 mL injection 125 mg (has no administration in time range)  ipratropium-albuterol (DUONEB) 0.5-2.5 (3) MG/3ML nebulizer solution 6 mL (has no administration in time range)    ED Course/ Medical Decision Making/ A&P                             Medical Decision Making Problems Addressed: Acute alteration in mental status: acute illness or injury with systemic symptoms that poses a threat to life or bodily functions COPD exacerbation (HCC): acute illness or injury with systemic symptoms that poses a threat to life or bodily functions  Amount and/or Complexity of Data Reviewed Independent Historian: EMS    Details: hx External Data Reviewed: notes. Labs: ordered. Decision-making details documented in ED Course. Radiology: ordered and independent interpretation performed. Decision-making details documented in ED Course. ECG/medicine tests: ordered and independent interpretation performed. Decision-making details documented in ED Course.  Risk Prescription drug management. Decision regarding hospitalization.   Iv ns. Continuous pulse ox and cardiac monitoring. Labs ordered/sent. Imaging ordered.   Differential diagnosis includes copd exacerbation, pna, viral syndrome/covid, etc. Dispo decision including potential need for admission considered - will get labs and imaging and reassess.   Reviewed nursing notes and prior charts for additional history. External reports reviewed. Additional history from: EMS.   Cardiac monitor: sinus rhythm, rate 60.  Albuterol tx, solumedrol iv.   Labs reviewed/interpreted by me - pnd.  Xrays  reviewed/interpreted by me - pnd.   1530 signed out to Dr Fors Rea that patient with earlier altered ms,  copd exacerbation/wheezing, labs and cxr pending, to check results of studies, recheck pt, and dispo appropriately.            Final Clinical Impression(s) / ED Diagnoses Final diagnoses:  COPD exacerbation (HCC)  Acute alteration in mental status    Rx / DC Orders ED Discharge Orders     None         Cathren Laine, MD 07/31/22 [phone]

## 2022-07-31 NOTE — ED Triage Notes (Signed)
Pt BIB [facility] from [facility]. Facility called out for pt unresponsive. Upon EMS arrival pt was not following commands, responsive to painful stimuli, 88% on 2L. Pt typically wears 2L at baseline. Pt placed on nonrebreather sats came up to 100%. 10 albuterol and 1 atrovent PTA. Pt currently A/O x4

## 2022-07-31 NOTE — ED Provider Notes (Signed)
  Physical Exam  BP (!) 104/57   Pulse (!) 50   Temp (!) 97.5 F (36.4 C) (Oral)   Resp (!) 22   SpO2 99%   Physical Exam Vitals and nursing note reviewed.  Constitutional:      General: She is not in acute distress.    Appearance: She is well-developed.  HENT:     Head: Normocephalic and atraumatic.  Eyes:     Conjunctiva/sclera: Conjunctivae normal.  Cardiovascular:     Rate and Rhythm: Normal rate and regular rhythm.     Heart sounds: No murmur heard. Pulmonary:     Effort: Pulmonary effort is normal. No respiratory distress.     Breath sounds: Wheezing present.  Abdominal:     Palpations: Abdomen is soft.     Tenderness: There is no abdominal tenderness.  Musculoskeletal:        General: No swelling.     Cervical back: Neck supple.  Skin:    General: Skin is warm and dry.     Capillary Refill: Capillary refill takes less than 2 seconds.  Neurological:     Mental Status: She is alert.  Psychiatric:        Mood and Affect: Mood normal.     Procedures  .Critical Care  Performed by: Glendora Score, MD Authorized by: Glendora Score, MD   Critical care provider statement:    Critical care time (minutes):  30   Critical care was necessary to treat or prevent imminent or life-threatening deterioration of the following conditions:  Respiratory failure   Critical care was time spent personally by me on the following activities:  Development of treatment plan with patient or surrogate, discussions with consultants, evaluation of patient's response to treatment, examination of patient, ordering and review of laboratory studies, ordering and review of radiographic studies, ordering and performing treatments and interventions, pulse oximetry, re-evaluation of patient's condition and review of old charts   ED Course / MDM    Medical Decision Making Amount and/or Complexity of Data Reviewed Labs: ordered. Radiology: ordered.  Risk Prescription drug  management.   Patient received in handoff.  Transient alteration of awareness with COPD exacerbation after keeping her oxygen off overnight.  Plan is for reevaluation after laboratory evaluation, chest x-ray and 2 DuoNeb's.  Laboratory evaluation largely unremarkable outside of a mild elevation in BNP to 394.9.  Chest x-ray with no overt pulmonary edema.  Patient significant proved after 2 DuoNebs and methylprednisolone.  She will be discharged back to her facility on 4 additional days of steroids and was able to be successfully transitioned off oxygen.       Glendora Score, MD 07/31/22 [phone]

## 2022-08-02 ENCOUNTER — Encounter: Payer: Self-pay | Admitting: Orthopedic Surgery

## 2022-08-02 ENCOUNTER — Non-Acute Institutional Stay: Payer: Medicare Other | Admitting: Orthopedic Surgery

## 2022-08-02 DIAGNOSIS — R262 Difficulty in walking, not elsewhere classified: Secondary | ICD-10-CM | POA: Diagnosis not present

## 2022-08-02 DIAGNOSIS — R600 Localized edema: Secondary | ICD-10-CM | POA: Diagnosis not present

## 2022-08-02 DIAGNOSIS — R278 Other lack of coordination: Secondary | ICD-10-CM | POA: Diagnosis not present

## 2022-08-02 DIAGNOSIS — M6281 Muscle weakness (generalized): Secondary | ICD-10-CM | POA: Diagnosis not present

## 2022-08-02 DIAGNOSIS — R001 Bradycardia, unspecified: Secondary | ICD-10-CM | POA: Diagnosis not present

## 2022-08-02 DIAGNOSIS — J441 Chronic obstructive pulmonary disease with (acute) exacerbation: Secondary | ICD-10-CM | POA: Diagnosis not present

## 2022-08-02 DIAGNOSIS — R400 Somnolence: Secondary | ICD-10-CM

## 2022-08-02 DIAGNOSIS — M62521 Muscle wasting and atrophy, not elsewhere classified, right upper arm: Secondary | ICD-10-CM | POA: Diagnosis not present

## 2022-08-02 NOTE — Progress Notes (Unsigned)
Location:  [facility] Room Number: 917-A Place of Service:  ALF [phone]) Provider: Hazle Nordmann, NP  Code Status: DNR Goals of Care:     08/02/2022   10:13 AM  Advanced Directives  Does Patient Have a Medical Advance Directive? Yes  Type of Advance Directive Living will;Out of facility DNR (pink MOST or yellow form)  Does patient want to make changes to medical advance directive? No - Patient declined     Chief Complaint  Patient presents with   Hospitalization Follow-up    ED follow up.    HPI: Patient is a 89 y.o. 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     Fibromyalgia    GERD 12/19/2006   Qualifier: Diagnosis of  By: Renaldo Fiddler CMA, Marliss Czar  04/13/16 TSH 1.20, Na 141, K 4.2, Bun 15, creat 0.79, wbc 5.5, Hgb 11.5, plt 283    GERD (gastroesophageal reflux disease)    Hammer toe of second toe of left foot 04/08/2016   Left 2nd   Hearing loss    Hemorrhoids    Hypercholesterolemia    Hypertension    Osteoarthritis 12/19/2006   Qualifier: Diagnosis of  By: Renaldo Fiddler CMA, Leigh     Osteoporosis     Past Surgical History:  Procedure Laterality Date   BIOPSY  02/17/2020   Procedure: BIOPSY;  Surgeon: Lynann Bologna, MD;  Location: [facility];  Service: Endoscopy;;   CATARACT EXTRACTION, BILATERAL  2009    COLONOSCOPY  2009, 2006 , 2017   ESOPHAGOGASTRODUODENOSCOPY (EGD) WITH PROPOFOL N/A 02/17/2020   Procedure: ESOPHAGOGASTRODUODENOSCOPY (EGD) WITH PROPOFOL;  Surgeon: Lynann Bologna, MD;  Location: [facility];  Service: Endoscopy;  Laterality: N/A;   IR ANGIOGRAM SELECTIVE EACH ADDITIONAL VESSEL  02/17/2020   IR ANGIOGRAM SELECTIVE EACH ADDITIONAL VESSEL  02/17/2020   IR ANGIOGRAM SELECTIVE EACH ADDITIONAL VESSEL  02/17/2020   IR ANGIOGRAM SELECTIVE EACH ADDITIONAL VESSEL  02/17/2020   IR ANGIOGRAM SELECTIVE EACH ADDITIONAL VESSEL  02/17/2020   IR ANGIOGRAM VISCERAL SELECTIVE  02/17/2020   IR ANGIOGRAM VISCERAL SELECTIVE  02/17/2020   IR US GUIDE VASC ACCESS RIGHT  02/17/2020   stapes surgery     Dr Dorma Russell   TONSILLECTOMY AND ADENOIDECTOMY     as a child    Allergies  Allergen Reactions   Penicillins Anaphylaxis   Bisphosphonates Other (See Comments)    [name]   Influenza Vaccines     Unknown   Simvastatin     Unknown    Trazodone And Nefazodone     Felt her throat was closing up/kept her awake   Sulfonamide Derivatives Rash and Other (See Comments)    blisters    Outpatient Encounter  Medications as of 08/02/2022  Medication Sig   acetaminophen (TYLENOL) 325 MG tablet Take 650 mg by mouth every 4 (four) hours as needed for fever.   albuterol (VENTOLIN HFA) 108 (90 Base) MCG/ACT inhaler Inhale into the lungs every 6 (six) hours as needed for wheezing or shortness of breath.   benzonatate (TESSALON) 100 MG capsule Take 100 mg by mouth 2 (two) times daily.   budesonide-formoterol (SYMBICORT) 80-4.5 MCG/ACT inhaler Inhale 1 puff into the lungs daily.   calcium carbonate (TUMS EX) 750 MG chewable tablet Chew 750 mg by mouth daily.    cholecalciferol (VITAMIN D) 1000 UNITS tablet Take 1,000 Units by mouth daily.   cyclobenzaprine (FLEXERIL) 5 MG tablet Take 2.5 mg by mouth at bedtime.   diclofenac Sodium (VOLTAREN) 1 % GEL Apply 2 g topically 3 (three) times daily as needed. Apply to left  wrist, elbow for pain.   docusate sodium (COLACE) 100 MG capsule Take 100 mg by mouth 2 (two) times daily.   ferrous sulfate 325 (65 FE) MG tablet Take 325 mg by mouth. Once A Day on Mon, Wed, Fri   furosemide (LASIX) 20 MG tablet Take 20 mg by mouth daily.   HYDROcodone-acetaminophen (NORCO/VICODIN) 5-325 MG tablet Take 1 tablet by mouth every 6 (six) hours as needed for moderate pain.   hydrocortisone cream 1 % Apply 1 application topically 2 (two) times daily as needed (wrists).   hydrocortisone valerate cream (WESTCORT) 0.2 % Apply 1 application topically 2 (two) times daily as needed (for itching). Cleanse behind ears with gentle cleanser and dry completely.   ipratropium-albuterol (DUONEB) 0.5-2.5 (3) MG/3ML SOLN Take 3 mLs by nebulization daily.   IPRATROPIUM-ALBUTEROL IN Inhale 1 vial into the lungs daily.   ketoconazole (NIZORAL) 2 % cream Apply 1 application  topically 2 (two) times daily as needed for irritation.   methocarbamol (ROBAXIN) 500 MG tablet Take 500 mg by mouth every 6 (six) hours as needed for muscle spasms.   metoprolol tartrate (LOPRESSOR) 25 MG tablet Take 12.5 mg by mouth 2 (two) times daily.   nitrofurantoin (MACRODANTIN) 50 MG capsule Take 50 mg by mouth at bedtime.   Olopatadine HCl 0.2 % SOLN Place 1 drop into both eyes daily.   polyethylene glycol (MIRALAX / GLYCOLAX) 17 g packet Take 17 g by mouth 2 (two) times a week. Sunday and Wednesday   potassium chloride (MICRO-K) 10 MEQ CR capsule Take 10 mEq by mouth daily.   pramoxine (SARNA SENSITIVE) 1 % LOTN Apply to cheeks topically one time a day for rosy burning cheeks   predniSONE (DELTASONE) 10 MG tablet Take 10 mg by mouth daily.   pregabalin (LYRICA) 75 MG capsule Take 1 capsule (75 mg total) by mouth at bedtime.   Propylene Glycol (SYSTANE COMPLETE) 0.6 % SOLN Instill 1 drop in both eyes four times a day for DRY EYE   vitamin B-12 (CYANOCOBALAMIN) 1000 MCG tablet Take 1,000 mcg by mouth daily.    [DISCONTINUED] ipratropium-albuterol (DUONEB) 0.5-2.5 (3) MG/3ML SOLN Take 3 mLs by nebulization daily as needed.   [DISCONTINUED] ipratropium-albuterol (DUONEB) 0.5-2.5 (3) MG/3ML SOLN Take 3 mLs by nebulization 2 (two) times daily for 5 days.   [DISCONTINUED] predniSONE (DELTASONE) 10 MG tablet Take 4 tablets (40 mg total) by mouth daily for 4 days.   [DISCONTINUED] predniSONE (DELTASONE) 5 MG tablet Take 5 mg by mouth daily.   [DISCONTINUED] traMADol (ULTRAM) 50 MG tablet Take 0.5 tablets (25 mg total) by mouth every 6 (six) hours  as needed.   [DISCONTINUED] traMADol (ULTRAM) 50 MG tablet Take 1 tablet (50 mg total) by mouth 3 (three) times daily.   No facility-administered encounter medications on file as of 08/02/2022.    Review of Systems:  Review of Systems  Health Maintenance  Topic Date Due   Pneumonia Vaccine 36+ Years old (3 of 3 - PPSV23 or PCV20) 08/31/2016   DTaP/Tdap/Td (2 - Tdap) 08/22/2019   Medicare Annual Wellness (AWV)  03/02/2023   DEXA SCAN  Completed   HPV VACCINES  Aged Out   INFLUENZA VACCINE  Discontinued   COVID-19 Vaccine  Discontinued   Zoster Vaccines- Shingrix  Discontinued    Physical Exam: Vitals:   08/02/22 0957  BP: 131/75  Pulse: (!) 52  Resp: 20  Temp: 97.7 F (36.5 C)  SpO2: 98%  Weight: 166 lb (75.3 kg)  Height: 5\' 1"  (1.549 m)   Body mass index is 31.37 kg/m. Physical Exam  Labs reviewed: Basic Metabolic Panel: Recent Labs    04/02/22 0000 07/31/22 1448  NA 141 137  K 4.5 3.6  CL 104 100  CO2 29* 27  GLUCOSE  --  112*  BUN 19 16  CREATININE 1.1 1.25*  CALCIUM 8.7 9.1   Liver Function Tests: Recent Labs    07/31/22 1448  AST 20  ALT 19  ALKPHOS 78  BILITOT 1.2  PROT 6.1*  ALBUMIN 3.6   No results for input(s): "LIPASE", "AMYLASE" in the last 8760 hours. No results for input(s): "AMMONIA" in the last 8760 hours. CBC: Recent Labs    07/31/22 1448  WBC 7.0  HGB 13.3  HCT 41.6  MCV 95.2  PLT 179   Lipid  Panel: No results for input(s): "CHOL", "HDL", "LDLCALC", "TRIG", "CHOLHDL", "LDLDIRECT" in the last 8760 hours. Lab Results  Component Value Date   HGBA1C 6.0 (H) 03/19/2021    Procedures since last visit: DG Chest Port 1 View  Result Date: 07/31/2022 CLINICAL DATA:  Shortness of breath. EXAM: PORTABLE CHEST 1 VIEW COMPARISON:  02/13/2016 FINDINGS: The heart size and mediastinal contours are within normal limits. Aortic atherosclerotic calcification incidentally noted. Both lungs are clear. The visualized skeletal structures are unremarkable. IMPRESSION: No active disease. Electronically Signed   By: Danae Orleans M.D.   On: 07/31/2022 15:42    Assessment/Plan There are no diagnoses linked to this encounter.   Labs/tests ordered:  * No order type specified * Next appt:  Visit date not found

## 2022-08-03 DIAGNOSIS — R278 Other lack of coordination: Secondary | ICD-10-CM | POA: Diagnosis not present

## 2022-08-03 DIAGNOSIS — M62521 Muscle wasting and atrophy, not elsewhere classified, right upper arm: Secondary | ICD-10-CM | POA: Diagnosis not present

## 2022-08-03 DIAGNOSIS — M6281 Muscle weakness (generalized): Secondary | ICD-10-CM | POA: Diagnosis not present

## 2022-08-03 DIAGNOSIS — R262 Difficulty in walking, not elsewhere classified: Secondary | ICD-10-CM | POA: Diagnosis not present

## 2022-08-04 DIAGNOSIS — M6281 Muscle weakness (generalized): Secondary | ICD-10-CM | POA: Diagnosis not present

## 2022-08-04 DIAGNOSIS — M62521 Muscle wasting and atrophy, not elsewhere classified, right upper arm: Secondary | ICD-10-CM | POA: Diagnosis not present

## 2022-08-04 DIAGNOSIS — R262 Difficulty in walking, not elsewhere classified: Secondary | ICD-10-CM | POA: Diagnosis not present

## 2022-08-04 DIAGNOSIS — R278 Other lack of coordination: Secondary | ICD-10-CM | POA: Diagnosis not present

## 2022-08-05 DIAGNOSIS — M6281 Muscle weakness (generalized): Secondary | ICD-10-CM | POA: Diagnosis not present

## 2022-08-05 DIAGNOSIS — M62521 Muscle wasting and atrophy, not elsewhere classified, right upper arm: Secondary | ICD-10-CM | POA: Diagnosis not present

## 2022-08-05 DIAGNOSIS — R278 Other lack of coordination: Secondary | ICD-10-CM | POA: Diagnosis not present

## 2022-08-05 DIAGNOSIS — R262 Difficulty in walking, not elsewhere classified: Secondary | ICD-10-CM | POA: Diagnosis not present

## 2022-08-06 ENCOUNTER — Non-Acute Institutional Stay: Payer: Medicare Other | Admitting: Nurse Practitioner

## 2022-08-06 ENCOUNTER — Encounter: Payer: Self-pay | Admitting: Nurse Practitioner

## 2022-08-06 DIAGNOSIS — N1831 Chronic kidney disease, stage 3a: Secondary | ICD-10-CM | POA: Diagnosis not present

## 2022-08-06 DIAGNOSIS — M159 Polyosteoarthritis, unspecified: Secondary | ICD-10-CM

## 2022-08-06 DIAGNOSIS — D649 Anemia, unspecified: Secondary | ICD-10-CM | POA: Diagnosis not present

## 2022-08-06 DIAGNOSIS — R600 Localized edema: Secondary | ICD-10-CM | POA: Diagnosis not present

## 2022-08-06 DIAGNOSIS — K219 Gastro-esophageal reflux disease without esophagitis: Secondary | ICD-10-CM

## 2022-08-06 DIAGNOSIS — R5381 Other malaise: Secondary | ICD-10-CM | POA: Diagnosis not present

## 2022-08-06 DIAGNOSIS — G3184 Mild cognitive impairment, so stated: Secondary | ICD-10-CM

## 2022-08-06 DIAGNOSIS — J4489 Other specified chronic obstructive pulmonary disease: Secondary | ICD-10-CM | POA: Diagnosis not present

## 2022-08-06 DIAGNOSIS — R300 Dysuria: Secondary | ICD-10-CM | POA: Diagnosis not present

## 2022-08-06 DIAGNOSIS — R7303 Prediabetes: Secondary | ICD-10-CM | POA: Diagnosis not present

## 2022-08-06 DIAGNOSIS — K5901 Slow transit constipation: Secondary | ICD-10-CM

## 2022-08-06 DIAGNOSIS — M797 Fibromyalgia: Secondary | ICD-10-CM | POA: Diagnosis not present

## 2022-08-06 DIAGNOSIS — I10 Essential (primary) hypertension: Secondary | ICD-10-CM

## 2022-08-06 NOTE — Assessment & Plan Note (Signed)
blood pressure is low, reduce Metoprolol to 12.5mg  every day/bid

## 2022-08-06 NOTE — Assessment & Plan Note (Signed)
on  Nitrofurantoin 50mg  qd for UTI suppression, Dr. Vernie Ammons, Estrace vaginal cream for atrophic vaginitis.

## 2022-08-06 NOTE — Assessment & Plan Note (Signed)
Chronic lower back, leg pain,  R shoulder pain, had Ortho eval, takes Lyrica  

## 2022-08-06 NOTE — Assessment & Plan Note (Signed)
Hgb a1c 6.0 03/19/21, update Hgb A1c, TSH

## 2022-08-06 NOTE — Assessment & Plan Note (Signed)
Moderate edema BLE, will obtain echocardiogram, increase Furosemide 40mg  bid, f/u BMP one week.

## 2022-08-06 NOTE — Assessment & Plan Note (Signed)
Multiple factorials, obtain MMSE, ADL functional test

## 2022-08-06 NOTE — Assessment & Plan Note (Signed)
GERD/chronic gastritis/atrophic gastritis, GI bleed 02/2020, s/p EGD, per biopsy, takes Protonix. F/u GI prn ?

## 2022-08-06 NOTE — Progress Notes (Unsigned)
Location:   [facility] Room Number: AL/917/A Place of Service:  ALF (13) Provider: Arna Snipe Mckinley Adelstein NP  Starlynn Klinkner X, NP  Patient Care Team: Deangelo Berns X, NP as PCP - General (Internal Medicine) Jalani Cullifer X, NP as Nurse Practitioner (Internal Medicine)  Extended Emergency Contact Information Primary Emergency Contact: Silguero,Jeff Home Phone: [phone] Relation: Son Secondary Emergency Contact: Culbreth Sr.,Christopher Home Phone: [phone] Relation: Relative  Code Status: DNR Goals of care: Advanced Directive information    08/02/2022   10:13 AM  Advanced Directives  Does Patient Have a Medical Advance Directive? Yes  Type of Advance Directive Living will;Out of facility DNR (pink MOST or yellow form)  Does patient want to make changes to medical advance directive? No - Patient declined     Chief Complaint  Patient presents with   Acute Visit    Patient is here for medication review and follow up after hospital stay  BP and pulse need 2nd reading for care gap metric    Quality Metric Gaps    Patient is also die for her Tdap vaccine    HPI:  Pt is a 89 y.o. female seen today for an acute visit for O2 desaturation, swelling legs, left 2nd bruise- no pain or redness or reduced ROM  COPD, intermittent chronic mild expiratory wheezes, c/o SOB, uses O2 more than prior,  takes Symbicort, Zyrtec, Mucinex, DuoNeb, prn Albuterol, cough is chronic, on Prednisone low dose. treated with higher dose of Prednisone for symptomatic control.  Hx of CVA, CT x2 03/2021 showed no acute findings except age indeterminate right corona radiator infarct, small vessel changes. Neurology recommended no further work ups.              Macular degeneration, f/u [facility]: OU for [place] MD involving tcentral area. 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              Urinary  symptoms, on  Nitrofurantoin 50mg  qd for UTI suppression, Dr. Vernie Ammons Estrace vaginal cream for atrophic vaginitis.              Anemia, Hx of Hgb 6.8 in hospital s/p 2 units of PRBC,  Vit B12, Iron, Hgb 13.3 07/31/22  CKD Bun/creat 16/1.25 07/31/22             GERD/chronic gastritis/atrophic gastritis, GI bleed 02/2020, s/p EGD, per biopsy, takes Protonix. F/u GI prn Fibromyalgia takes Lyrica             Tactile hallucinations, off meds. TSH 0.661 03/19/21             Constipation, takes MiraLax 2x/wk. Colace daily.              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     Fibromyalgia    GERD 12/19/2006   Qualifier: Diagnosis of  By: Renaldo Fiddler CMA, Marliss Czar  04/13/16 TSH 1.20, Na 141, K 4.2, Bun 15, creat 0.79, wbc 5.5, Hgb 11.5, plt 283    GERD (gastroesophageal reflux disease)    Hammer toe of second toe of left foot 04/08/2016   Left 2nd   Hearing loss    Hemorrhoids    Hypercholesterolemia    Hypertension    Osteoarthritis 12/19/2006   Qualifier: Diagnosis of  By: Renaldo Fiddler CMA, Leigh     Osteoporosis    Past Surgical History:  Procedure Laterality Date   BIOPSY  02/17/2020   Procedure: BIOPSY;  Surgeon: Lynann Bologna, MD;  Location: [facility];  Service: Endoscopy;;   CATARACT EXTRACTION, BILATERAL  2009   COLONOSCOPY  2009,  2006 , 2017   ESOPHAGOGASTRODUODENOSCOPY (EGD) WITH PROPOFOL N/A 02/17/2020   Procedure: ESOPHAGOGASTRODUODENOSCOPY (EGD) WITH PROPOFOL;  Surgeon: Lynann Bologna, MD;  Location: [facility];  Service: Endoscopy;  Laterality: N/A;   IR ANGIOGRAM SELECTIVE EACH ADDITIONAL VESSEL  02/17/2020   IR ANGIOGRAM SELECTIVE EACH ADDITIONAL VESSEL  02/17/2020   IR ANGIOGRAM SELECTIVE EACH ADDITIONAL VESSEL  02/17/2020   IR ANGIOGRAM SELECTIVE EACH ADDITIONAL VESSEL  02/17/2020   IR ANGIOGRAM SELECTIVE EACH ADDITIONAL VESSEL  02/17/2020   IR ANGIOGRAM VISCERAL SELECTIVE  02/17/2020   IR ANGIOGRAM VISCERAL SELECTIVE  02/17/2020   IR US GUIDE VASC ACCESS RIGHT  02/17/2020   stapes surgery     Dr Dorma Russell   TONSILLECTOMY AND ADENOIDECTOMY     as a child    Allergies  Allergen Reactions   Penicillins Anaphylaxis   Bisphosphonates Other (See Comments)    [name]   Influenza Vaccines     Unknown   Simvastatin     Unknown    Trazodone And Nefazodone     Felt her throat was closing up/kept her awake   Sulfonamide Derivatives Rash and Other (See Comments)    blisters    Allergies as of 08/06/2022       Reactions   Penicillins Anaphylaxis   Bisphosphonates Other (See Comments)   [name]   Influenza Vaccines    Unknown   Simvastatin    Unknown    Trazodone And Nefazodone    Felt her throat was closing up/kept her awake   Sulfonamide Derivatives Rash, Other (See Comments)   blisters        Medication List        Accurate as of August 06, 2022 11:59 PM. If you have any questions, ask your nurse or doctor.          acetaminophen 325 MG tablet Commonly known as: TYLENOL Take 650 mg by mouth every 4 (four) hours as needed for fever.   albuterol 108 (90 Base) MCG/ACT inhaler Commonly known as: VENTOLIN HFA Inhale into the lungs every 6 (six) hours as needed for wheezing or shortness of breath.   benzonatate 100 MG capsule Commonly known as: TESSALON Take 100 mg by mouth 2 (two) times  daily.   budesonide-formoterol 80-4.5 MCG/ACT inhaler Commonly known as: SYMBICORT Inhale  1 puff into the lungs daily.   calcium carbonate 750 MG chewable tablet Commonly known as: TUMS EX Chew 750 mg by mouth daily.   cholecalciferol 1000 units tablet Commonly known as: VITAMIN D Take 1,000 Units by mouth daily.   cyanocobalamin 1000 MCG tablet Commonly known as: VITAMIN B12 Take 1,000 mcg by mouth daily.   cyclobenzaprine 5 MG tablet Commonly known as: FLEXERIL Take 2.5 mg by mouth at bedtime.   docusate sodium 100 MG capsule Commonly known as: COLACE Take 100 mg by mouth 2 (two) times daily.   ferrous sulfate 325 (65 FE) MG tablet Take 325 mg by mouth. Once A Day on Mon, Wed, Fri   furosemide 20 MG tablet Commonly known as: LASIX Take 20 mg by mouth daily.   HYDROcodone-acetaminophen 5-325 MG tablet Commonly known as: NORCO/VICODIN Take 1 tablet by mouth every 6 (six) hours as needed for moderate pain.   hydrocortisone cream 1 % Apply 1 application topically 2 (two) times daily as needed (wrists).   hydrocortisone valerate cream 0.2 % Commonly known as: WESTCORT Apply 1 application topically 2 (two) times daily as needed (for itching). Cleanse behind ears with gentle cleanser and dry completely.   ipratropium-albuterol 0.5-2.5 (3) MG/3ML Soln Commonly known as: DUONEB Take 3 mLs by nebulization daily.   IPRATROPIUM-ALBUTEROL IN Inhale 1 vial into the lungs daily.   ketoconazole 2 % cream Commonly known as: NIZORAL Apply 1 application  topically 2 (two) times daily as needed for irritation.   methocarbamol 500 MG tablet Commonly known as: ROBAXIN Take 500 mg by mouth every 6 (six) hours as needed for muscle spasms.   metoprolol tartrate 25 MG tablet Commonly known as: LOPRESSOR Take 12.5 mg by mouth 2 (two) times daily.   nitrofurantoin 50 MG capsule Commonly known as: MACRODANTIN Take 50 mg by mouth at bedtime.   Olopatadine HCl 0.2 % Soln Place  1 drop into both eyes daily.   polyethylene glycol 17 g packet Commonly known as: MIRALAX / GLYCOLAX Take 17 g by mouth 2 (two) times a week. Sunday and Wednesday   potassium chloride 10 MEQ CR capsule Commonly known as: MICRO-K Take 10 mEq by mouth daily.   predniSONE 10 MG tablet Commonly known as: DELTASONE Take 10 mg by mouth daily.   pregabalin 75 MG capsule Commonly known as: LYRICA Take 1 capsule (75 mg total) by mouth at bedtime.   Sarna Sensitive 1 % Lotn Generic drug: pramoxine Apply to cheeks topically one time a day for rosy burning cheeks   Systane Complete 0.6 % Soln Generic drug: Propylene Glycol Instill 1 drop in both eyes four times a day for DRY EYE   Voltaren 1 % Gel Generic drug: diclofenac Sodium Apply 2 g topically 3 (three) times daily as needed. Apply to left wrist, elbow for pain.        Review of Systems  Constitutional:  Negative for appetite change, fatigue and fever.  HENT:  Positive for hearing loss. Negative for congestion and voice change.   Eyes:  Negative for visual disturbance.       C/o blurred vision, ? Color, ? Right eye peripheral vision loss-f/u Ophthalmology  Respiratory:  Positive for cough and shortness of breath. Negative for chest tightness and wheezing.        DOE, chronic hacking cough  Cardiovascular:  Positive for leg swelling. Negative for chest pain and palpitations.  Gastrointestinal:  Negative for abdominal pain and constipation.  Genitourinary:  Negative for dysuria  and urgency.       Chronic, on and off  Musculoskeletal:  Positive for arthralgias, back pain, gait problem and myalgias. Negative for joint swelling.       New mid back pain, mostly in the muscle next to thoracic spine palpated.   Skin:  Negative for color change.       Top of the left 2nd toe bruise.   Neurological:  Negative for speech difficulty, weakness and light-headedness.  Psychiatric/Behavioral:  Negative for behavioral problems,  hallucinations and sleep disturbance.        Chronic going to bed late, sleeping in late. Lethargic upon my examination.     Immunization History  Administered Date(s) Administered   Moderna SARS-COV2 Booster Vaccination 12/25/2019   Moderna Sars-Covid-2 Vaccination 02/17/2019, 03/17/2019   Pneumococcal Conjugate-13 09/01/2015   Pneumococcal Polysaccharide-23 07/07/1998   Td 08/21/2009   Pertinent  Health Maintenance Due  Topic Date Due   DEXA SCAN  Completed   INFLUENZA VACCINE  Discontinued      03/20/2021   12:00 AM 03/20/2021   12:00 PM 02/05/2022    9:16 AM 03/09/2022   10:01 AM 07/23/2022    3:44 PM  Fall Risk  Falls in the past year?   0 0 0  Was there an injury with Fall?   0 0 0  Fall Risk Category Calculator   0 0 0  Fall Risk Category (Retired)   Low    (RETIRED) Patient Fall Risk Level High fall risk High fall risk High fall risk    Patient at Risk for Falls Due to   History of fall(s) No Fall Risks No Fall Risks  Fall risk Follow up   Falls evaluation completed Falls evaluation completed Falls evaluation completed   Functional Status Survey:    Vitals:   08/06/22 1446 08/10/22 1437  BP: (!) 122/48 (!) 118/54  Pulse: (!) 48   Resp: 18   Temp: (!) 97 F (36.1 C)   SpO2: 95%   Weight: 166 lb (75.3 kg)   Height: 5\' 1"  (1.549 m)    Body mass index is 31.37 kg/m. Physical Exam Vitals and nursing note reviewed.  Constitutional:      Appearance: Normal appearance.  HENT:     Head: Normocephalic and atraumatic.     Nose: Nose normal.     Mouth/Throat:     Mouth: Mucous membranes are moist.  Eyes:     Extraocular Movements: Extraocular movements intact.     Conjunctiva/sclera: Conjunctivae normal.     Pupils: Pupils are equal, round, and reactive to light.     Comments: Able to count my fingers 3 feet away from her eyes.   Cardiovascular:     Rate and Rhythm: Normal rate and regular rhythm.     Heart sounds: No murmur heard. Pulmonary:     Effort:  Pulmonary effort is normal.     Breath sounds: Rales present. No wheezing.     Comments: Decreased air entry to both lungs. Needs O2 via [place] to maintain SatO2>90%. Moist rales bibasilar.  Chest:     Chest wall: No tenderness.  Abdominal:     General: Bowel sounds are normal.     Palpations: Abdomen is soft.     Tenderness: There is no abdominal tenderness.  Musculoskeletal:        General: Tenderness present.     Cervical back: Normal range of motion and neck supple.     Right lower leg: Edema present.  Left lower leg: Edema present.     Comments: 2+ edema BLE. R shoulder pain with overhead ROM. Better with mid back pain  Skin:    General: Skin is warm and dry.     Findings: Bruising present.     Comments: Top of the left 2nd toe bruise, no pain, redness, warmth, or decreased ROM  Neurological:     General: No focal deficit present.     Mental Status: She is alert. Mental status is at baseline.     Motor: No weakness.     Coordination: Coordination normal.     Gait: Gait abnormal.     Comments: Oriented to person, place.   Psychiatric:        Mood and Affect: Mood normal.        Behavior: Behavior normal.     Labs reviewed: Recent Labs    04/02/22 0000 07/31/22 1448  NA 141 137  K 4.5 3.6  CL 104 100  CO2 29* 27  GLUCOSE  --  112*  BUN 19 16  CREATININE 1.1 1.25*  CALCIUM 8.7 9.1   Recent Labs    07/31/22 1448  AST 20  ALT 19  ALKPHOS 78  BILITOT 1.2  PROT 6.1*  ALBUMIN 3.6   Recent Labs    07/27/22 0000 07/31/22 1448  WBC 8.1 7.0  HGB 12.3 13.3  HCT 37 41.6  MCV  --  95.2  PLT 194 179   Lab Results  Component Value Date   TSH 0.661 03/19/2021   Lab Results  Component Value Date   HGBA1C 6.0 (H) 03/19/2021   Lab Results  Component Value Date   CHOL 192 03/19/2021   HDL 69 03/19/2021   LDLCALC 107 (H) 03/19/2021   LDLDIRECT 114.7 08/18/2011   TRIG 78 03/19/2021   CHOLHDL 2.8 03/19/2021    Significant Diagnostic Results in last 30  days:  DG Chest Port 1 View  Result Date: 07/31/2022 CLINICAL DATA:  Shortness of breath. EXAM: PORTABLE CHEST 1 VIEW COMPARISON:  02/13/2016 FINDINGS: The heart size and mediastinal contours are within normal limits. Aortic atherosclerotic calcification incidentally noted. Both lungs are clear. The visualized skeletal structures are unremarkable. IMPRESSION: No active disease. Electronically Signed   By: Danae Orleans M.D.   On: 07/31/2022 15:42    Assessment/Plan: COPD (chronic obstructive pulmonary disease) with chronic bronchitis O2 desaturation, swelling legs, left 2nd bruise- no pain or redness or reduced ROM  COPD, intermittent chronic mild expiratory wheezes, c/o SOB, uses O2 more than prior,  takes Symbicort, Zyrtec, Mucinex, DuoNeb, prn Albuterol, cough is chronic, on Prednisone low dose. treated with higher dose of Prednisone for symptomatic control.   Pulmonology consultation, O2 2lpm via [place] to maintain SatO2>90%, wean off O2 if possible(leave O2 off, check sat O2 q 30 minutes for 3 hours, if O2 sat maintains >90%, then prn)  06/10/22 Hospice declined admission to service.     Edema, peripheral Moderate edema BLE, will obtain echocardiogram, increase Furosemide 40mg  bid, f/u BMP one week.   Osteoarthritis Chronic lower back, leg pain,  R shoulder pain, had Ortho eval, takes Lyrica   HTN (hypertension) blood pressure is low, reduce Metoprolol to 12.5mg  every day/bid  Dysuria on  Nitrofurantoin 50mg  qd for UTI suppression, Dr. Vernie Ammons, Estrace vaginal cream for atrophic vaginitis.   Chronic anemia Hx of Hgb 6.8 in hospital s/p 2 units of PRBC,  Vit B12, Iron, Hgb 13.3 07/31/22  CKD (chronic kidney disease) stage 3, GFR 30-59  ml/min (HCC) Bun/creat 16/1.25 07/31/22  GERD   GERD/chronic gastritis/atrophic gastritis, GI bleed 02/2020, s/p EGD, per biopsy, takes Protonix. F/u GI prn  Fibromyalgia takes Lyrica  Slow transit constipation takes MiraLax 2x/wk. Colace daily.    Prediabetes Hgb a1c 6.0 03/19/21, update Hgb A1c, TSH  Debility Multiple factorials, obtain MMSE, ADL functional test  Mild cognitive impairment 08/06/22 MMSE 30/30    Family/ staff Communication: plan of care reviewed with the patient, the patient's son, son in law, and Press photographer.   Labs/tests ordered:  Echocardiogram, BMP, BNP, TSH, Hgb A1c one week.   Time spend 40 minutes.

## 2022-08-06 NOTE — Assessment & Plan Note (Signed)
takes MiraLax 2x/wk. Colace daily.  °

## 2022-08-06 NOTE — Assessment & Plan Note (Signed)
Hx of Hgb 6.8 in hospital s/p 2 units of PRBC,  Vit B12, Iron, Hgb 13.3 07/31/22

## 2022-08-06 NOTE — Assessment & Plan Note (Addendum)
O2 desaturation, swelling legs, left 2nd bruise- no pain or redness or reduced ROM  COPD, intermittent chronic mild expiratory wheezes, c/o SOB, uses O2 more than prior,  takes Symbicort, Zyrtec, Mucinex, DuoNeb, prn Albuterol, cough is chronic, on Prednisone low dose. treated with higher dose of Prednisone for symptomatic control.   Pulmonology consultation, O2 2lpm via [place] to maintain SatO2>90%, wean off O2 if possible(leave O2 off, check sat O2 q 30 minutes for 3 hours, if O2 sat maintains >90%, then prn)  06/10/22 Hospice declined admission to service.

## 2022-08-06 NOTE — Assessment & Plan Note (Signed)
takes Lyrica 

## 2022-08-06 NOTE — Assessment & Plan Note (Signed)
Bun/creat 16/1.25 07/31/22

## 2022-08-09 NOTE — Assessment & Plan Note (Signed)
08/06/22 MMSE 30/30

## 2022-08-10 ENCOUNTER — Encounter: Payer: Self-pay | Admitting: Nurse Practitioner

## 2022-08-10 DIAGNOSIS — R262 Difficulty in walking, not elsewhere classified: Secondary | ICD-10-CM | POA: Diagnosis not present

## 2022-08-10 DIAGNOSIS — R278 Other lack of coordination: Secondary | ICD-10-CM | POA: Diagnosis not present

## 2022-08-10 DIAGNOSIS — M62521 Muscle wasting and atrophy, not elsewhere classified, right upper arm: Secondary | ICD-10-CM | POA: Diagnosis not present

## 2022-08-10 DIAGNOSIS — M6281 Muscle weakness (generalized): Secondary | ICD-10-CM | POA: Diagnosis not present

## 2022-08-12 DIAGNOSIS — I5020 Unspecified systolic (congestive) heart failure: Secondary | ICD-10-CM | POA: Diagnosis not present

## 2022-08-12 DIAGNOSIS — D649 Anemia, unspecified: Secondary | ICD-10-CM | POA: Diagnosis not present

## 2022-08-12 DIAGNOSIS — I82403 Acute embolism and thrombosis of unspecified deep veins of lower extremity, bilateral: Secondary | ICD-10-CM | POA: Diagnosis not present

## 2022-08-12 DIAGNOSIS — N1831 Chronic kidney disease, stage 3a: Secondary | ICD-10-CM | POA: Diagnosis not present

## 2022-08-12 DIAGNOSIS — I10 Essential (primary) hypertension: Secondary | ICD-10-CM | POA: Diagnosis not present

## 2022-08-12 DIAGNOSIS — R1013 Epigastric pain: Secondary | ICD-10-CM | POA: Diagnosis not present

## 2022-08-12 DIAGNOSIS — J9621 Acute and chronic respiratory failure with hypoxia: Secondary | ICD-10-CM | POA: Diagnosis not present

## 2022-08-12 DIAGNOSIS — Z515 Encounter for palliative care: Secondary | ICD-10-CM | POA: Diagnosis not present

## 2022-08-12 DIAGNOSIS — K219 Gastro-esophageal reflux disease without esophagitis: Secondary | ICD-10-CM | POA: Diagnosis not present

## 2022-08-13 ENCOUNTER — Non-Acute Institutional Stay (SKILLED_NURSING_FACILITY): Payer: Medicare Other | Admitting: Nurse Practitioner

## 2022-08-13 ENCOUNTER — Encounter: Payer: Self-pay | Admitting: Nurse Practitioner

## 2022-08-13 DIAGNOSIS — M159 Polyosteoarthritis, unspecified: Secondary | ICD-10-CM | POA: Diagnosis not present

## 2022-08-13 DIAGNOSIS — K219 Gastro-esophageal reflux disease without esophagitis: Secondary | ICD-10-CM

## 2022-08-13 DIAGNOSIS — N390 Urinary tract infection, site not specified: Secondary | ICD-10-CM

## 2022-08-13 DIAGNOSIS — N1831 Chronic kidney disease, stage 3a: Secondary | ICD-10-CM | POA: Diagnosis not present

## 2022-08-13 DIAGNOSIS — M549 Dorsalgia, unspecified: Secondary | ICD-10-CM | POA: Diagnosis not present

## 2022-08-13 DIAGNOSIS — I10 Essential (primary) hypertension: Secondary | ICD-10-CM | POA: Diagnosis not present

## 2022-08-13 DIAGNOSIS — K5901 Slow transit constipation: Secondary | ICD-10-CM

## 2022-08-13 DIAGNOSIS — I509 Heart failure, unspecified: Secondary | ICD-10-CM | POA: Diagnosis not present

## 2022-08-13 DIAGNOSIS — I63411 Cerebral infarction due to embolism of right middle cerebral artery: Secondary | ICD-10-CM

## 2022-08-13 DIAGNOSIS — D62 Acute posthemorrhagic anemia: Secondary | ICD-10-CM

## 2022-08-13 DIAGNOSIS — J4489 Other specified chronic obstructive pulmonary disease: Secondary | ICD-10-CM | POA: Diagnosis not present

## 2022-08-13 HISTORY — DX: Heart failure, unspecified: I50.9

## 2022-08-13 LAB — COMPREHENSIVE METABOLIC PANEL
Calcium: 8.8 (ref 8.7–10.7)
eGFR: 43

## 2022-08-13 LAB — BASIC METABOLIC PANEL
BUN: 22 — AB (ref 4–21)
CO2: 29 — AB (ref 13–22)
Chloride: 101 (ref 99–108)
Creatinine: 1.2 — AB (ref 0.5–1.1)
Glucose: 89
Potassium: 3.1 mEq/L — AB (ref 3.5–5.1)
Sodium: 142 (ref 137–147)

## 2022-08-13 LAB — TSH: TSH: 1.03 (ref 0.41–5.90)

## 2022-08-13 LAB — HEMOGLOBIN A1C: Hemoglobin A1C: 6.6

## 2022-08-13 NOTE — Assessment & Plan Note (Signed)
GERD/chronic gastritis/atrophic gastritis, GI bleed 02/2020, s/p EGD, per biopsy, takes Protonix. F/u GI prn ?

## 2022-08-13 NOTE — Assessment & Plan Note (Signed)
intermittent chronic mild expiratory wheezes, c/o SOB, uses O2 more than prior,  takes Symbicort, Zyrtec, Mucinex, DuoNeb, prn Albuterol, cough is chronic, on Prednisone low dose. treated with higher dose of Prednisone for symptomatic control.  

## 2022-08-13 NOTE — Assessment & Plan Note (Signed)
Hx of CVA, CT x2 03/2021 showed no acute findings except age indeterminate right corona radiator infarct, small vessel changes. Neurology recommended no further work ups 

## 2022-08-13 NOTE — Assessment & Plan Note (Signed)
Bun/creat 22/1.22 08/12/22

## 2022-08-13 NOTE — Assessment & Plan Note (Signed)
Hx of Hgb 6.8 in hospital s/p 2 units of PRBC,  Vit B12, Iron, Hgb 13.3 07/31/22 

## 2022-08-13 NOTE — Progress Notes (Signed)
Location:   S[facility] Room Number: NO/60/A Place of Service:  SNF (31) Provider: Arna Snipe Jaylynn Mcaleer NP  Trianna Lupien X, NP  Patient Care Team: Brailon Don X, NP as PCP - General (Internal Medicine) Randi Poullard X, NP as Nurse Practitioner (Internal Medicine)  Extended Emergency Contact Information Primary Emergency Contact: Smead,Jeff Home Phone: [phone] Relation: Son Secondary Emergency Contact: Culbreth Sr.,Christopher Home Phone: [phone] Relation: Relative  Code Status: DNR Goals of care: Advanced Directive information    08/02/2022   10:13 AM  Advanced Directives  Does Patient Have a Medical Advance Directive? Yes  Type of Advance Directive Living will;Out of facility DNR (pink MOST or yellow form)  Does patient want to make changes to medical advance directive? No - Patient declined     Chief Complaint  Patient presents with  . Acute Visit    Patient is being seen for low potassium    HPI:  Pt is a 89 y.o. female seen today for an acute visit for K 3.1 08/12/22  CHF clinically presumed. Pending echocardiogram, elevated BNP in 300s, already on Furosemide 40mg  bid, will increase Kcl to po bid 2/2 to serum K 3.1 08/12/22, repeat BMP one week.             COPD, intermittent chronic mild expiratory wheezes, c/o SOB, uses O2 more than prior,  takes Symbicort, Zyrtec, Mucinex, DuoNeb, prn Albuterol, cough is chronic, on Prednisone low dose. treated with higher dose of Prednisone for symptomatic control.  Hx of CVA, CT x2 03/2021 showed no acute findings except age indeterminate right corona radiator infarct, small vessel changes. Neurology recommended no further work ups.              Macular degeneration, f/u [facility]: OU for [place] MD involving tcentral area. 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              Urinary symptoms, on   Nitrofurantoin 50mg  qd for UTI suppression, Dr. Vernie Ammons Estrace vaginal cream for atrophic vaginitis.              Anemia, Hx of Hgb 6.8 in hospital s/p 2 units of PRBC,  Vit B12, Iron, Hgb 13.3 07/31/22             CKD Bun/creat 22/1.22 08/12/22             GERD/chronic gastritis/atrophic gastritis, GI bleed 02/2020, s/p EGD, per biopsy, takes Protonix. F/u GI prn Fibromyalgia takes Lyrica             Tactile hallucinations, off meds. TSH 0.661 03/19/21             Constipation, takes MiraLax 2x/wk. Colace daily.              Prediabetes, Hgb a1c 6.6 08/12/22             Hypokalemia, K 3.1 08/12/22, increased K bid       Past Medical History:  Diagnosis Date  . Acute blood loss anemia 08/23/2013   04/13/16 TSH 1.20, Na 141, K 4.2, Bun 15, creat 0.79, wbc 5.5, Hgb 11.5, plt 283  . Anxiety   . Anxiety state 07/02/2007   Qualifier: Diagnosis of  By: Kriste Basque MD, Lorin Picket  M   . Asthma   . 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 Cigarette smoker   . Colon polyps 2009   TWO TUBULAR ADENOMAS AND HYPERPLASTIC POLYPS  . Congestive heart failure (CHF) (HCC) 08/13/2022  . Constipation 09/14/2013  . COPD (chronic obstructive pulmonary disease) (HCC)   . Diverticulosis of colon   . DJD (degenerative joint disease)   . Dog bite of limb 07/14/2010   Dog bite 07/10/10 - dogs shots utd Last td was 08/2009  Localized tx only.    . Fibromyalgia   . GERD 12/19/2006   Qualifier: Diagnosis of  By: Renaldo Fiddler CMA, Marliss Czar  04/13/16 TSH 1.20, Na 141, K 4.2, Bun 15, creat 0.79, wbc 5.5, Hgb 11.5, plt 283   . GERD (gastroesophageal reflux disease)   . Hammer toe of second toe of left foot 04/08/2016   Left 2nd  . Hearing loss   . Hemorrhoids   . Hypercholesterolemia   . Hypertension   . Osteoarthritis 12/19/2006   Qualifier: Diagnosis of  By: Renaldo Fiddler CMA, Marliss Czar    . Osteoporosis    Past Surgical History:  Procedure Laterality Date  . BIOPSY  02/17/2020   Procedure: BIOPSY;  Surgeon: Lynann Bologna, MD;   Location: [facility];  Service: Endoscopy;;  . CATARACT EXTRACTION, BILATERAL  2009  . COLONOSCOPY  2009, 2006 , 2017  . ESOPHAGOGASTRODUODENOSCOPY (EGD) WITH PROPOFOL N/A 02/17/2020   Procedure: ESOPHAGOGASTRODUODENOSCOPY (EGD) WITH PROPOFOL;  Surgeon: Lynann Bologna, MD;  Location: [facility];  Service: Endoscopy;  Laterality: N/A;  . IR ANGIOGRAM SELECTIVE EACH ADDITIONAL VESSEL  02/17/2020  . IR ANGIOGRAM SELECTIVE EACH ADDITIONAL VESSEL  02/17/2020  . IR ANGIOGRAM SELECTIVE EACH ADDITIONAL VESSEL  02/17/2020  . IR ANGIOGRAM SELECTIVE EACH ADDITIONAL VESSEL  02/17/2020  . IR ANGIOGRAM SELECTIVE EACH ADDITIONAL VESSEL  02/17/2020  . IR ANGIOGRAM VISCERAL SELECTIVE  02/17/2020  . IR ANGIOGRAM VISCERAL SELECTIVE  02/17/2020  . IR US GUIDE VASC ACCESS RIGHT  02/17/2020  . stapes surgery     Dr Dorma Russell  . TONSILLECTOMY AND ADENOIDECTOMY     as a child    Allergies  Allergen Reactions  . Penicillins Anaphylaxis  . Bisphosphonates Other (See Comments)    [name]  . Influenza Vaccines     Unknown  . Simvastatin     Unknown   . Trazodone And Nefazodone     Felt her throat was closing up/kept her awake  . Sulfonamide Derivatives Rash and Other (See Comments)    blisters    Allergies as of 08/13/2022       Reactions   Penicillins Anaphylaxis   Bisphosphonates Other (See Comments)   [name]   Influenza Vaccines    Unknown   Simvastatin    Unknown    Trazodone And Nefazodone    Felt her throat was closing up/kept her awake   Sulfonamide Derivatives Rash, Other (See Comments)   blisters        Medication List        Accurate as of August 13, 2022  3:19 PM. If you have any questions, ask your nurse or doctor.          acetaminophen 325 MG tablet Commonly known as: TYLENOL Take 650 mg by mouth every 4 (four) hours as needed for fever.   albuterol 108 (90 Base) MCG/ACT inhaler Commonly known as: VENTOLIN HFA Inhale into the lungs every 6 (six) hours as needed for  wheezing or shortness of breath.  benzonatate 100 MG capsule Commonly known as: TESSALON Take 100 mg by mouth 2 (two) times daily.   budesonide-formoterol 80-4.5 MCG/ACT inhaler Commonly known as: SYMBICORT Inhale 1 puff into the lungs daily.   calcium carbonate 750 MG chewable tablet Commonly known as: TUMS EX Chew 750 mg by mouth daily.   cholecalciferol 1000 units tablet Commonly known as: VITAMIN D Take 1,000 Units by mouth daily.   cyanocobalamin 1000 MCG tablet Commonly known as: VITAMIN B12 Take 1,000 mcg by mouth daily.   cyclobenzaprine 5 MG tablet Commonly known as: FLEXERIL Take 2.5 mg by mouth at bedtime.   docusate sodium 100 MG capsule Commonly known as: COLACE Take 100 mg by mouth 2 (two) times daily.   ferrous sulfate 325 (65 FE) MG tablet Take 325 mg by mouth. Once A Day on Mon, Wed, Fri   furosemide 20 MG tablet Commonly known as: LASIX Take 20 mg by mouth daily.   HYDROcodone-acetaminophen 5-325 MG tablet Commonly known as: NORCO/VICODIN Take 1 tablet by mouth every 6 (six) hours as needed for moderate pain.   hydrocortisone cream 1 % Apply 1 application topically 2 (two) times daily as needed (wrists).   hydrocortisone valerate cream 0.2 % Commonly known as: WESTCORT Apply 1 application topically 2 (two) times daily as needed (for itching). Cleanse behind ears with gentle cleanser and dry completely.   ipratropium-albuterol 0.5-2.5 (3) MG/3ML Soln Commonly known as: DUONEB Take 3 mLs by nebulization daily.   IPRATROPIUM-ALBUTEROL IN Inhale 1 vial into the lungs daily.   ketoconazole 2 % cream Commonly known as: NIZORAL Apply 1 application  topically 2 (two) times daily as needed for irritation.   methocarbamol 500 MG tablet Commonly known as: ROBAXIN Take 500 mg by mouth every 6 (six) hours as needed for muscle spasms.   metoprolol tartrate 25 MG tablet Commonly known as: LOPRESSOR Take 12.5 mg by mouth 2 (two) times daily.    nitrofurantoin 50 MG capsule Commonly known as: MACRODANTIN Take 50 mg by mouth at bedtime.   Olopatadine HCl 0.2 % Soln Place 1 drop into both eyes daily.   polyethylene glycol 17 g packet Commonly known as: MIRALAX / GLYCOLAX Take 17 g by mouth 2 (two) times a week. Sunday and Wednesday   potassium chloride 10 MEQ CR capsule Commonly known as: MICRO-K Take 10 mEq by mouth daily.   predniSONE 10 MG tablet Commonly known as: DELTASONE Take 10 mg by mouth daily.   pregabalin 75 MG capsule Commonly known as: LYRICA Take 1 capsule (75 mg total) by mouth at bedtime.   Sarna Sensitive 1 % Lotn Generic drug: pramoxine Apply to cheeks topically one time a day for rosy burning cheeks   Systane Complete 0.6 % Soln Generic drug: Propylene Glycol Instill 1 drop in both eyes four times a day for DRY EYE   Voltaren 1 % Gel Generic drug: diclofenac Sodium Apply 2 g topically 3 (three) times daily as needed. Apply to left wrist, elbow for pain.        Review of Systems  Constitutional:  Negative for appetite change, fatigue and fever.  HENT:  Positive for hearing loss. Negative for congestion and voice change.   Eyes:  Negative for visual disturbance.       C/o blurred vision, ? Color, ? Right eye peripheral vision loss-f/u Ophthalmology  Respiratory:  Positive for cough and shortness of breath. Negative for chest tightness and wheezing.        DOE, chronic hacking cough  Cardiovascular:  Positive for leg swelling. Negative for chest pain and palpitations.  Gastrointestinal:  Negative for abdominal pain and constipation.  Genitourinary:  Negative for dysuria and urgency.       Chronic, on and off  Musculoskeletal:  Positive for arthralgias, back pain, gait problem and myalgias. Negative for joint swelling.       New mid back pain, mostly in the muscle next to thoracic spine palpated.   Skin:  Negative for color change.       Top of the left 2nd toe bruise.   Neurological:   Negative for speech difficulty, weakness and light-headedness.  Psychiatric/Behavioral:  Negative for behavioral problems, hallucinations and sleep disturbance.        Chronic going to bed late, sleeping in late. Lethargic upon my examination.     Immunization History  Administered Date(s) Administered  . Moderna SARS-COV2 Booster Vaccination 12/25/2019  . Moderna Sars-Covid-2 Vaccination 02/17/2019, 03/17/2019  . Pneumococcal Conjugate-13 09/01/2015  . Pneumococcal Polysaccharide-23 07/07/1998  . Td 08/21/2009   Pertinent  Health Maintenance Due  Topic Date Due  . DEXA SCAN  Completed  . INFLUENZA VACCINE  Discontinued      03/20/2021   12:00 AM 03/20/2021   12:00 PM 02/05/2022    9:16 AM 03/09/2022   10:01 AM 07/23/2022    3:44 PM  Fall Risk  Falls in the past year?   0 0 0  Was there an injury with Fall?   0 0 0  Fall Risk Category Calculator   0 0 0  Fall Risk Category (Retired)   Low    (RETIRED) Patient Fall Risk Level High fall risk High fall risk High fall risk    Patient at Risk for Falls Due to   History of fall(s) No Fall Risks No Fall Risks  Fall risk Follow up   Falls evaluation completed Falls evaluation completed Falls evaluation completed   Functional Status Survey:    Vitals:   08/13/22 1440  BP: 115/62  Pulse: (!) 59  Resp: 18  Temp: (!) 96.2 F (35.7 C)  SpO2: 94%  Weight: 166 lb (75.3 kg)  Height: 5' (1.524 m)   Body mass index is 32.42 kg/m. Physical Exam Vitals and nursing note reviewed.  Constitutional:      Appearance: Normal appearance.  HENT:     Head: Normocephalic and atraumatic.     Nose: Nose normal.     Mouth/Throat:     Mouth: Mucous membranes are moist.  Eyes:     Extraocular Movements: Extraocular movements intact.     Conjunctiva/sclera: Conjunctivae normal.     Pupils: Pupils are equal, round, and reactive to light.     Comments: Able to count my fingers 3 feet away from her eyes.   Cardiovascular:     Rate and Rhythm:  Normal rate and regular rhythm.     Heart sounds: No murmur heard. Pulmonary:     Effort: Pulmonary effort is normal.     Breath sounds: Rales present. No wheezing.     Comments: Decreased air entry to both lungs. Needs O2 via [place] to maintain SatO2>90%. Moist rales bibasilar.  Chest:     Chest wall: No tenderness.  Abdominal:     General: Bowel sounds are normal.     Palpations: Abdomen is soft.     Tenderness: There is no abdominal tenderness.  Musculoskeletal:        General: Tenderness present.     Cervical back: Normal range  of motion and neck supple.     Right lower leg: Edema present.     Left lower leg: Edema present.     Comments: 2+ edema BLE. R shoulder pain with overhead ROM. Better with mid back pain  Skin:    General: Skin is warm and dry.     Findings: Bruising present.     Comments: Top of the left 2nd toe bruise, no pain, redness, warmth, or decreased ROM  Neurological:     General: No focal deficit present.     Mental Status: She is alert. Mental status is at baseline.     Motor: No weakness.     Coordination: Coordination normal.     Gait: Gait abnormal.     Comments: Oriented to person, place.   Psychiatric:        Mood and Affect: Mood normal.        Behavior: Behavior normal.    Labs reviewed: Recent Labs    04/02/22 0000 07/31/22 1448  NA 141 137  K 4.5 3.6  CL 104 100  CO2 29* 27  GLUCOSE  --  112*  BUN 19 16  CREATININE 1.1 1.25*  CALCIUM 8.7 9.1   Recent Labs    07/31/22 1448  AST 20  ALT 19  ALKPHOS 78  BILITOT 1.2  PROT 6.1*  ALBUMIN 3.6   Recent Labs    07/27/22 0000 07/31/22 1448  WBC 8.1 7.0  HGB 12.3 13.3  HCT 37 41.6  MCV  --  95.2  PLT 194 179   Lab Results  Component Value Date   TSH 0.661 03/19/2021   Lab Results  Component Value Date   HGBA1C 6.0 (H) 03/19/2021   Lab Results  Component Value Date   CHOL 192 03/19/2021   HDL 69 03/19/2021   LDLCALC 107 (H) 03/19/2021   LDLDIRECT 114.7 08/18/2011   TRIG  78 03/19/2021   CHOLHDL 2.8 03/19/2021    Significant Diagnostic Results in last 30 days:  DG Chest Port 1 View  Result Date: 07/31/2022 CLINICAL DATA:  Shortness of breath. EXAM: PORTABLE CHEST 1 VIEW COMPARISON:  02/13/2016 FINDINGS: The heart size and mediastinal contours are within normal limits. Aortic atherosclerotic calcification incidentally noted. Both lungs are clear. The visualized skeletal structures are unremarkable. IMPRESSION: No active disease. Electronically Signed   By: Danae Orleans M.D.   On: 07/31/2022 15:42    Assessment/Plan: Congestive heart failure (CHF) (HCC) Pending echocardiogram, elevated BNP in 300s, already on Furosemide 40mg  bid, will increase Kcl to po bid 2/2 to serum K 3.1 08/12/22, repeat BMP one week.   COPD (chronic obstructive pulmonary disease) with chronic bronchitis intermittent chronic mild expiratory wheezes, c/o SOB, uses O2 more than prior,  takes Symbicort, Zyrtec, Mucinex, DuoNeb, prn Albuterol, cough is chronic, on Prednisone low dose. treated with higher dose of Prednisone for symptomatic control.   CVA (cerebral vascular accident) [facility]) Hx of CVA, CT x2 03/2021 showed no acute findings except age indeterminate right corona radiator infarct, small vessel changes. Neurology recommended no further work ups.  Osteoarthritis  Chronic lower back, leg pain,  R shoulder pain, had Ortho eval, takes Ly  HTN (hypertension) Intermittent elevated Sbp,  taking  Metoprolol.   UTI (urinary tract infection)  on  Nitrofurantoin 50mg  qd for UTI suppression, Dr. Vernie Ammons. Estrace vaginal cream for atrophic vaginitis.   ABLA (acute blood loss anemia) Hx of Hgb 6.8 in hospital s/p 2 units of PRBC,  Vit B12, Iron,  Hgb 13.3 07/31/22  CKD (chronic kidney disease) stage 3, GFR 30-59 ml/min (HCC) Bun/creat 22/1.22 08/12/22  GERD  GERD/chronic gastritis/atrophic gastritis, GI bleed 02/2020, s/p EGD, per biopsy, takes Protonix. F/u GI prn  Mid back  pain takes Lyrica  Slow transit constipation takes MiraLax 2x/wk. Colace daily.     Family/ staff Communication: plan of care reviewed with the patient and charge nurse.   Labs/tests ordered:  BMP one week  Time spend 35 minutes.

## 2022-08-13 NOTE — Assessment & Plan Note (Signed)
on  Nitrofurantoin 50mg qd for UTI suppression, Dr. Ottelin. Estrace vaginal cream for atrophic vaginitis.  °

## 2022-08-13 NOTE — Assessment & Plan Note (Signed)
takes MiraLax 2x/wk. Colace daily.  °

## 2022-08-13 NOTE — Assessment & Plan Note (Signed)
Intermittent elevated Sbp,  taking  Metoprolol.

## 2022-08-13 NOTE — Assessment & Plan Note (Signed)
Chronic lower back, leg pain,  R shoulder pain, had Ortho eval, takes Ly

## 2022-08-13 NOTE — Assessment & Plan Note (Signed)
takes Lyrica 

## 2022-08-13 NOTE — Assessment & Plan Note (Signed)
Pending echocardiogram, elevated BNP in 300s, already on Furosemide 40mg  bid, will increase Kcl to po bid 2/2 to serum K 3.1 08/12/22, repeat BMP one week.

## 2022-08-16 ENCOUNTER — Encounter: Payer: Self-pay | Admitting: Nurse Practitioner

## 2022-08-17 ENCOUNTER — Non-Acute Institutional Stay (SKILLED_NURSING_FACILITY): Payer: Medicare Other | Admitting: Family Medicine

## 2022-08-17 DIAGNOSIS — F5105 Insomnia due to other mental disorder: Secondary | ICD-10-CM

## 2022-08-17 DIAGNOSIS — I509 Heart failure, unspecified: Secondary | ICD-10-CM | POA: Diagnosis not present

## 2022-08-17 DIAGNOSIS — D62 Acute posthemorrhagic anemia: Secondary | ICD-10-CM | POA: Diagnosis not present

## 2022-08-17 DIAGNOSIS — M797 Fibromyalgia: Secondary | ICD-10-CM | POA: Diagnosis not present

## 2022-08-17 DIAGNOSIS — F419 Anxiety disorder, unspecified: Secondary | ICD-10-CM | POA: Diagnosis not present

## 2022-08-17 DIAGNOSIS — J449 Chronic obstructive pulmonary disease, unspecified: Secondary | ICD-10-CM | POA: Diagnosis not present

## 2022-08-17 DIAGNOSIS — J4489 Other specified chronic obstructive pulmonary disease: Secondary | ICD-10-CM

## 2022-08-17 DIAGNOSIS — I10 Essential (primary) hypertension: Secondary | ICD-10-CM

## 2022-08-17 NOTE — Progress Notes (Addendum)
Provider:  Jacalyn Lefevre, MD Location:      Place of Service:     PCP: Mast, Man X, NP Patient Care Team: Mast, Man X, NP as PCP - General (Internal Medicine) Mast, Man X, NP as Nurse Practitioner (Internal Medicine)  Extended Emergency Contact Information Primary Emergency Contact: Mazor,Jeff Home Phone: [phone] Relation: Son Secondary Emergency Contact: Culbreth Sr.,Christopher Home Phone: [phone] Relation: Relative  Code Status:  Goals of Care: Advanced Directive information    08/02/2022   10:13 AM  Advanced Directives  Does Patient Have a Medical Advance Directive? Yes  Type of Advance Directive Living will;Out of facility DNR (pink MOST or yellow form)  Does patient want to make changes to medical advance directive? No - Patient declined      No chief complaint on file.   HPI: Patient is a 89 y.o. female seen today for admission to [facility] from assisted living in same facility.  Moved here was precipitated by Myriam Jacobson needing more assistance with ADLs, including dressing.  She has a history of COPD, congestive heart failure, depression, osteoarthritis, hypertension, anemia, GERD, stage IIIa chronic kidney disease. Patient seems glad to be in this different environment but at the same time as sad and tearful for several reasons.  First loss of her daughter about 1 year ago and also the loss of her functional abilities.  At my last visit with her we talked about using an antidepressant and in reviewing her old record she has been on imipramine and trazodone.  Even though she said 1 of these medicines worked record indicates there were problems with the medicines.  She expresses a desire to not continue on.  She is very opinionated about her medicines and stubborn at times.  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     Fibromyalgia    GERD 12/19/2006   Qualifier: Diagnosis of  By: Renaldo Fiddler CMA, Marliss Czar  04/13/16 TSH 1.20, Na 141, K 4.2, Bun 15, creat 0.79, wbc 5.5, Hgb 11.5, plt 283    GERD (gastroesophageal reflux disease)    Hammer toe of second toe of left foot 04/08/2016   Left 2nd   Hearing loss    Hemorrhoids    Hypercholesterolemia    Hypertension    Osteoarthritis 12/19/2006   Qualifier: Diagnosis of  By: Renaldo Fiddler CMA, Leigh     Osteoporosis    Past Surgical History:  Procedure Laterality Date   BIOPSY  02/17/2020   Procedure: BIOPSY;  Surgeon: Lynann Bologna, MD;  Location: [facility];  Service: Endoscopy;;   CATARACT EXTRACTION, BILATERAL  2009   COLONOSCOPY  2009, 2006 , 2017   ESOPHAGOGASTRODUODENOSCOPY (EGD) WITH PROPOFOL N/A 02/17/2020   Procedure: ESOPHAGOGASTRODUODENOSCOPY (EGD) WITH  PROPOFOL;  Surgeon: Lynann Bologna, MD;  Location: [facility];  Service: Endoscopy;  Laterality: N/A;   IR ANGIOGRAM SELECTIVE EACH ADDITIONAL VESSEL  02/17/2020   IR ANGIOGRAM SELECTIVE EACH ADDITIONAL VESSEL  02/17/2020   IR ANGIOGRAM SELECTIVE EACH ADDITIONAL VESSEL  02/17/2020   IR ANGIOGRAM SELECTIVE EACH ADDITIONAL VESSEL  02/17/2020   IR ANGIOGRAM SELECTIVE EACH ADDITIONAL VESSEL  02/17/2020   IR ANGIOGRAM VISCERAL SELECTIVE  02/17/2020   IR ANGIOGRAM VISCERAL SELECTIVE  02/17/2020   IR US GUIDE VASC ACCESS RIGHT  02/17/2020   stapes surgery     Dr Dorma Russell   TONSILLECTOMY AND ADENOIDECTOMY     as a  child    reports that she quit smoking about 12 years ago. Her smoking use included cigarettes. She has never used smokeless tobacco. She reports current alcohol use. She reports that she does not use drugs. Social History   Socioeconomic History   Marital status: Widowed    Spouse name: Not on file   Number of children: Not on file   Years of education: Not on file   Highest education level: Not on file  Occupational History   Occupation: retired   Occupation: works some weekends at [employer]  Tobacco Use   Smoking status: Former    Types: Cigarettes    Quit date: 02/15/2010    Years since quitting: 12.5   Smokeless tobacco: Never  Vaping Use   Vaping Use: Never used  Substance and Sexual Activity   Alcohol use: Yes    Comment: occasional glass of wine   Drug use: No   Sexual activity: Never  Other Topics Concern   Not on file  Social History Narrative   Pt works some weekends at [employer]   Caffeine use - daily coffee in the mornings   Patient gets regular exercise > tries to walk daily for exercise   Moved to [facility] 03/11/16   Widowed   Former Smoker-stopped 2012   Alcohol occasionally glass of wine   Living Will   Social Determinants of Health   Financial Resource Strain: Low Risk  (11/08/2017)   Overall Financial Resource Strain (CARDIA)    Difficulty of Paying Living Expenses: Not hard at all  Food Insecurity: No Food Insecurity (11/08/2017)   Hunger Vital Sign    Worried About Running Out of Food in the Last Year: Never true    Ran Out of Food in the Last Year: Never true  Transportation Needs: No Transportation Needs (11/08/2017)   PRAPARE - Administrator, Civil Service (Medical): No    Lack of Transportation (Non-Medical): No  Physical Activity: Sufficiently Active (11/08/2017)   Exercise Vital Sign    Days of Exercise per Week: 5 days    Minutes of Exercise per Session: 30 min  Stress: No Stress Concern Present  (11/08/2017)   [employer] of Occupational Health - Occupational Stress Questionnaire    Feeling of Stress : Not at all  Social Connections: Moderately Isolated (11/08/2017)   Social Connection and Isolation Panel [NHANES]    Frequency of Communication with Friends and Family: More than three times a week    Frequency of Social Gatherings with Friends and Family: Twice a week    Attends Religious Services: Never    Database administrator or Organizations: No    Attends Banker Meetings: Never    Marital Status: Widowed  Intimate Partner Violence: Not At Risk (11/08/2017)  Humiliation, Afraid, Rape, and Kick questionnaire    Fear of Current or Ex-Partner: No    Emotionally Abused: No    Physically Abused: No    Sexually Abused: No    Functional Status Survey:    Family History  Problem Relation Age of Onset   Heart disease Father    COPD Father    Hypertension Mother    Arthritis Mother     Health Maintenance  Topic Date Due   Pneumonia Vaccine 67+ Years old (3 of 3 - PPSV23 or PCV20) 08/31/2016   DTaP/Tdap/Td (2 - Tdap) 08/22/2019   Medicare Annual Wellness (AWV)  03/02/2023   DEXA SCAN  Completed   HPV VACCINES  Aged Out   INFLUENZA VACCINE  Discontinued   COVID-19 Vaccine  Discontinued   Zoster Vaccines- Shingrix  Discontinued    Allergies  Allergen Reactions   Penicillins Anaphylaxis   Bisphosphonates Other (See Comments)    [name]   Influenza Vaccines     Unknown   Simvastatin     Unknown    Trazodone And Nefazodone     Felt her throat was closing up/kept her awake   Sulfonamide Derivatives Rash and Other (See Comments)    blisters    Outpatient Encounter Medications as of 08/17/2022  Medication Sig   acetaminophen (TYLENOL) 325 MG tablet Take 650 mg by mouth every 4 (four) hours as needed for fever.   albuterol (VENTOLIN HFA) 108 (90 Base) MCG/ACT inhaler Inhale into the lungs every 6 (six) hours as needed for wheezing or  shortness of breath.   benzonatate (TESSALON) 100 MG capsule Take 100 mg by mouth 2 (two) times daily.   budesonide-formoterol (SYMBICORT) 80-4.5 MCG/ACT inhaler Inhale 1 puff into the lungs daily.   calcium carbonate (TUMS EX) 750 MG chewable tablet Chew 750 mg by mouth daily.    cholecalciferol (VITAMIN D) 1000 UNITS tablet Take 1,000 Units by mouth daily.   cyclobenzaprine (FLEXERIL) 5 MG tablet Take 2.5 mg by mouth at bedtime.   diclofenac Sodium (VOLTAREN) 1 % GEL Apply 2 g topically 3 (three) times daily as needed. Apply to left wrist, elbow for pain.   docusate sodium (COLACE) 100 MG capsule Take 100 mg by mouth 2 (two) times daily.   ferrous sulfate 325 (65 FE) MG tablet Take 325 mg by mouth. Once A Day on Mon, Wed, Fri   furosemide (LASIX) 20 MG tablet Take 20 mg by mouth daily.   HYDROcodone-acetaminophen (NORCO/VICODIN) 5-325 MG tablet Take 1 tablet by mouth every 6 (six) hours as needed for moderate pain.   hydrocortisone cream 1 % Apply 1 application topically 2 (two) times daily as needed (wrists).   hydrocortisone valerate cream (WESTCORT) 0.2 % Apply 1 application topically 2 (two) times daily as needed (for itching). Cleanse behind ears with gentle cleanser and dry completely.   ipratropium-albuterol (DUONEB) 0.5-2.5 (3) MG/3ML SOLN Take 3 mLs by nebulization daily.   IPRATROPIUM-ALBUTEROL IN Inhale 1 vial into the lungs daily.   ketoconazole (NIZORAL) 2 % cream Apply 1 application  topically 2 (two) times daily as needed for irritation.   methocarbamol (ROBAXIN) 500 MG tablet Take 500 mg by mouth every 6 (six) hours as needed for muscle spasms.   metoprolol tartrate (LOPRESSOR) 25 MG tablet Take 12.5 mg by mouth 2 (two) times daily.   nitrofurantoin (MACRODANTIN) 50 MG capsule Take 50 mg by mouth at bedtime.   Olopatadine HCl 0.2 % SOLN Place 1 drop into both eyes  daily.   polyethylene glycol (MIRALAX / GLYCOLAX) 17 g packet Take 17 g by mouth 2 (two) times a week. Sunday and  Wednesday   potassium chloride (MICRO-K) 10 MEQ CR capsule Take 10 mEq by mouth daily.   pramoxine (SARNA SENSITIVE) 1 % LOTN Apply to cheeks topically one time a day for rosy burning cheeks   predniSONE (DELTASONE) 10 MG tablet Take 10 mg by mouth daily.   pregabalin (LYRICA) 75 MG capsule Take 1 capsule (75 mg total) by mouth at bedtime.   Propylene Glycol (SYSTANE COMPLETE) 0.6 % SOLN Instill 1 drop in both eyes four times a day for DRY EYE   vitamin B-12 (CYANOCOBALAMIN) 1000 MCG tablet Take 1,000 mcg by mouth daily.   No facility-administered encounter medications on file as of 08/17/2022.    Review of Systems  Constitutional:  Positive for fatigue.  HENT: Negative.    Respiratory:  Positive for shortness of breath and wheezing.   Cardiovascular:  Positive for chest pain.  Genitourinary: Negative.   Psychiatric/Behavioral:  Positive for sleep disturbance. The patient is nervous/anxious.   All other systems reviewed and are negative.   There were no vitals filed for this visit. There is no height or weight on file to calculate BMI. Physical Exam Vitals and nursing note reviewed.  Constitutional:      Appearance: Normal appearance.  HENT:     Head: Normocephalic.     Mouth/Throat:     Mouth: Mucous membranes are moist.     Pharynx: Oropharynx is clear.  Eyes:     Extraocular Movements: Extraocular movements intact.     Pupils: Pupils are equal, round, and reactive to light.  Cardiovascular:     Rate and Rhythm: Normal rate and regular rhythm.  Pulmonary:     Effort: Pulmonary effort is normal.     Breath sounds: Normal breath sounds.     Comments: Patient on oxygen chronically Abdominal:     General: Bowel sounds are normal.     Palpations: Abdomen is soft.  Musculoskeletal:     Cervical back: Normal range of motion.     Left lower leg: No edema.  Neurological:     General: No focal deficit present.     Mental Status: She is alert and oriented to person, place, and  time.  Psychiatric:        Mood and Affect: Mood normal.        Behavior: Behavior normal.     Labs reviewed: Basic Metabolic Panel: Recent Labs    04/02/22 0000 07/31/22 1448  NA 141 137  K 4.5 3.6  CL 104 100  CO2 29* 27  GLUCOSE  --  112*  BUN 19 16  CREATININE 1.1 1.25*  CALCIUM 8.7 9.1   Liver Function Tests: Recent Labs    07/31/22 1448  AST 20  ALT 19  ALKPHOS 78  BILITOT 1.2  PROT 6.1*  ALBUMIN 3.6   No results for input(s): "LIPASE", "AMYLASE" in the last 8760 hours. No results for input(s): "AMMONIA" in the last 8760 hours. CBC: Recent Labs    07/27/22 0000 07/31/22 1448  WBC 8.1 7.0  HGB 12.3 13.3  HCT 37 41.6  MCV  --  95.2  PLT 194 179   Cardiac Enzymes: No results for input(s): "CKTOTAL", "CKMB", "CKMBINDEX", "TROPONINI" in the last 8760 hours. BNP: Invalid input(s): "POCBNP" Lab Results  Component Value Date   HGBA1C 6.0 (H) 03/19/2021   Lab Results  Component Value Date  TSH 0.661 03/19/2021   Lab Results  Component Value Date   VITAMINB12 1,441 (H) 03/19/2021   No results found for: "FOLATE" Lab Results  Component Value Date   IRON 59 01/10/2019   TIBC 281 01/10/2019   FERRITIN 46 01/10/2019    Imaging and Procedures obtained prior to SNF admission: DG Chest Port 1 View  Result Date: 07/31/2022 CLINICAL DATA:  Shortness of breath. EXAM: PORTABLE CHEST 1 VIEW COMPARISON:  02/13/2016 FINDINGS: The heart size and mediastinal contours are within normal limits. Aortic atherosclerotic calcification incidentally noted. Both lungs are clear. The visualized skeletal structures are unremarkable. IMPRESSION: No active disease. Electronically Signed   By: Danae Orleans M.D.   On: 07/31/2022 15:42    Assessment/Plan 1. ABLA (acute blood loss anemia) Most recent hemoglobin was 13.5.  Will follow anemia  2. Congestive heart failure, unspecified HF chronicity, unspecified heart failure type (HCC) Had echocardiogram yesterday which was  read as normal but ejection fraction not provided  3. COPD (chronic obstructive pulmonary disease) with chronic bronchitis Has inhaler and neb treatments as well as chronic O2.  At her request we will increase neb treatment twice daily  4. Fibromyalgia Continue with prednisone 10 mg  5. Insomnia secondary to anxiety Will begin Lexapro low-dose in hopes of helping her depression as well as insomnia   6. Primary hypertension Blood pressure has been a little low recently.  Plan to hold metoprolol and monitor pressure.   Family/ staff Communication:   Labs/tests ordered:  Bertram Millard. Hyacinth Meeker, MD [facility] [address] [place], [place] [address] Office [phone]

## 2022-08-19 DIAGNOSIS — J449 Chronic obstructive pulmonary disease, unspecified: Secondary | ICD-10-CM | POA: Diagnosis not present

## 2022-08-23 DIAGNOSIS — J449 Chronic obstructive pulmonary disease, unspecified: Secondary | ICD-10-CM | POA: Diagnosis not present

## 2022-08-24 DIAGNOSIS — D649 Anemia, unspecified: Secondary | ICD-10-CM | POA: Diagnosis not present

## 2022-08-24 DIAGNOSIS — I10 Essential (primary) hypertension: Secondary | ICD-10-CM | POA: Diagnosis not present

## 2022-08-24 DIAGNOSIS — J449 Chronic obstructive pulmonary disease, unspecified: Secondary | ICD-10-CM | POA: Diagnosis not present

## 2022-08-24 LAB — COMPREHENSIVE METABOLIC PANEL
Calcium: 8.8 (ref 8.7–10.7)
eGFR: 48

## 2022-08-24 LAB — BASIC METABOLIC PANEL
BUN: 18 (ref 4–21)
CO2: 31 — AB (ref 13–22)
Chloride: 103 (ref 99–108)
Creatinine: 1.1 (ref 0.5–1.1)
Glucose: 90
Potassium: 4.1 mEq/L (ref 3.5–5.1)
Sodium: 141 (ref 137–147)

## 2022-08-25 DIAGNOSIS — J449 Chronic obstructive pulmonary disease, unspecified: Secondary | ICD-10-CM | POA: Diagnosis not present

## 2022-08-26 ENCOUNTER — Non-Acute Institutional Stay (SKILLED_NURSING_FACILITY): Payer: Medicare Other | Admitting: Nurse Practitioner

## 2022-08-26 ENCOUNTER — Encounter: Payer: Self-pay | Admitting: Nurse Practitioner

## 2022-08-26 DIAGNOSIS — I509 Heart failure, unspecified: Secondary | ICD-10-CM

## 2022-08-26 DIAGNOSIS — J4489 Other specified chronic obstructive pulmonary disease: Secondary | ICD-10-CM

## 2022-08-26 DIAGNOSIS — K219 Gastro-esophageal reflux disease without esophagitis: Secondary | ICD-10-CM | POA: Diagnosis not present

## 2022-08-26 DIAGNOSIS — M797 Fibromyalgia: Secondary | ICD-10-CM

## 2022-08-26 DIAGNOSIS — D62 Acute posthemorrhagic anemia: Secondary | ICD-10-CM

## 2022-08-26 DIAGNOSIS — M159 Polyosteoarthritis, unspecified: Secondary | ICD-10-CM

## 2022-08-26 DIAGNOSIS — N1831 Chronic kidney disease, stage 3a: Secondary | ICD-10-CM | POA: Diagnosis not present

## 2022-08-26 DIAGNOSIS — I63411 Cerebral infarction due to embolism of right middle cerebral artery: Secondary | ICD-10-CM | POA: Diagnosis not present

## 2022-08-26 DIAGNOSIS — I10 Essential (primary) hypertension: Secondary | ICD-10-CM

## 2022-08-26 DIAGNOSIS — F419 Anxiety disorder, unspecified: Secondary | ICD-10-CM | POA: Diagnosis not present

## 2022-08-26 DIAGNOSIS — N952 Postmenopausal atrophic vaginitis: Secondary | ICD-10-CM | POA: Diagnosis not present

## 2022-08-26 DIAGNOSIS — F5105 Insomnia due to other mental disorder: Secondary | ICD-10-CM

## 2022-08-26 DIAGNOSIS — K5901 Slow transit constipation: Secondary | ICD-10-CM

## 2022-08-26 DIAGNOSIS — H353130 Nonexudative age-related macular degeneration, bilateral, stage unspecified: Secondary | ICD-10-CM

## 2022-08-26 DIAGNOSIS — J449 Chronic obstructive pulmonary disease, unspecified: Secondary | ICD-10-CM | POA: Diagnosis not present

## 2022-08-26 NOTE — Progress Notes (Signed)
Location:  [facility] Room Number: NO/60/A Place of Service:  SNF (31) Provider:  Wendy Mikles X, NP  Patient Care Team: Genesis Novosad X, NP as PCP - General (Internal Medicine) Jace Dowe X, NP as Nurse Practitioner (Internal Medicine)  Extended Emergency Contact Information Primary Emergency Contact: Gurevich,Jeff Home Phone: [phone] Relation: Son Secondary Emergency Contact: Culbreth Sr.,Christopher Home Phone: [phone] Relation: Relative  Code Status:  DNR Goals of care: Advanced Directive information    08/26/2022    8:36 AM  Advanced Directives  Does Patient Have a Medical Advance Directive? Yes  Type of Advance Directive Living will;Out of facility DNR (pink MOST or yellow form)  Does patient want to make changes to medical advance directive? No - Patient declined     Chief Complaint  Patient presents with   Acute Visit    Insomina    HPI:  Pt is a 89 y.o. female seen today for an acute visit for insomnia, not new issue, refused hypnotics in the past, now the patient desires sleep aid.             CHF clinically presumed. 08/16/22 Echo mild aortic stenosis, normal left ventricular systolic function, no EF.  BNP in 364 08/12/22, already on Furosemide/Kcl             COPD, intermittent chronic mild expiratory wheezes, c/o SOB, uses O2 more than prior,  takes Symbicort, Zyrtec, Mucinex, DuoNeb, prn Albuterol, cough is chronic, on Prednisone low dose. treated with higher dose of Prednisone for symptomatic control.  Hx of CVA, CT x2 03/2021 showed no acute findings except age indeterminate right corona radiator infarct, small vessel changes. Neurology recommended no further work ups.              Macular degeneration, f/u [facility]: OU for Scotoma MD involving tcentral area. 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              Urinary  symptoms, on  Nitrofurantoin 50mg  qd for UTI suppression, Dr. Vernie Ammons Estrace vaginal cream for atrophic vaginitis.              Anemia, Hx of Hgb 6.8 in hospital s/p 2 units of PRBC,  Vit B12, Iron, Hgb 13.3 07/31/22             CKD Bun/creat 18/1.1 08/24/22             GERD/chronic gastritis/atrophic gastritis, GI bleed 02/2020, s/p EGD, per biopsy, takes Protonix. F/u GI prn Fibromyalgia takes Lyrica             Tactile hallucinations, off meds. TSH 0.661 03/19/21             Constipation, takes MiraLax 2x/wk. Colace daily.              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     Fibromyalgia    GERD 12/19/2006   Qualifier: Diagnosis of  By: Renaldo Fiddler CMA, Marliss Czar  04/13/16 TSH 1.20, Na 141, K 4.2, Bun 15, creat 0.79, wbc 5.5, Hgb 11.5, plt 283    GERD (gastroesophageal reflux disease)    Hammer toe of second toe of left foot 04/08/2016   Left 2nd   Hearing loss    Hemorrhoids    Hypercholesterolemia    Hypertension    Osteoarthritis 12/19/2006   Qualifier: Diagnosis of  By: Renaldo Fiddler CMA, Leigh     Osteoporosis    Past Surgical History:  Procedure Laterality Date   BIOPSY  02/17/2020   Procedure: BIOPSY;  Surgeon: Lynann Bologna, MD;  Location: [facility];   Service: Endoscopy;;   CATARACT EXTRACTION, BILATERAL  2009   COLONOSCOPY  2009, 2006 , 2017   ESOPHAGOGASTRODUODENOSCOPY (EGD) WITH PROPOFOL N/A 02/17/2020   Procedure: ESOPHAGOGASTRODUODENOSCOPY (EGD) WITH PROPOFOL;  Surgeon: Lynann Bologna, MD;  Location: [facility];  Service: Endoscopy;  Laterality: N/A;   IR ANGIOGRAM SELECTIVE EACH ADDITIONAL VESSEL  02/17/2020   IR ANGIOGRAM SELECTIVE EACH ADDITIONAL VESSEL  02/17/2020   IR ANGIOGRAM SELECTIVE EACH ADDITIONAL VESSEL  02/17/2020   IR ANGIOGRAM SELECTIVE EACH ADDITIONAL VESSEL  02/17/2020   IR ANGIOGRAM SELECTIVE EACH ADDITIONAL VESSEL  02/17/2020   IR ANGIOGRAM VISCERAL SELECTIVE  02/17/2020   IR ANGIOGRAM VISCERAL SELECTIVE  02/17/2020   IR US GUIDE VASC ACCESS RIGHT  02/17/2020   stapes surgery     Dr Dorma Russell   TONSILLECTOMY AND ADENOIDECTOMY     as a child    Allergies  Allergen Reactions   Penicillins Anaphylaxis   Bisphosphonates Other (See Comments)    [name]   Influenza Vaccines     Unknown   Simvastatin     Unknown    Trazodone And Nefazodone     Felt her throat was closing up/kept her awake   Sulfonamide Derivatives Rash and Other (See Comments)    blisters    Outpatient Encounter Medications as of 08/26/2022  Medication Sig   acetaminophen (TYLENOL) 325 MG tablet Take 650 mg by mouth every 4 (four) hours as needed for fever.   albuterol (VENTOLIN HFA) 108 (90 Base) MCG/ACT inhaler Inhale into the lungs every 6 (six) hours as needed for wheezing or shortness of breath.   benzonatate (TESSALON) 100 MG capsule Take 100 mg by mouth 2 (two) times daily.   budesonide-formoterol (SYMBICORT) 80-4.5 MCG/ACT inhaler Inhale 1 puff into the lungs daily.   calcium carbonate (TUMS EX) 750 MG chewable tablet Chew 750 mg by mouth daily.    cholecalciferol (VITAMIN D) 1000 UNITS tablet Take 1,000 Units by mouth daily.   cyclobenzaprine (FLEXERIL) 5 MG tablet Take 2.5 mg by mouth at bedtime.   diclofenac Sodium (VOLTAREN) 1 % GEL Apply  2 g topically 3 (three) times daily as needed. Apply to left  wrist, elbow for pain.   docusate sodium (COLACE) 100 MG capsule Take 100 mg by mouth 2 (two) times daily.   ferrous sulfate 325 (65 FE) MG tablet Take 325 mg by mouth. Once A Day on Mon, Wed, Fri   furosemide (LASIX) 20 MG tablet Take 20 mg by mouth daily.   HYDROcodone-acetaminophen (NORCO/VICODIN) 5-325 MG tablet Take 1 tablet by mouth every 6 (six) hours as needed for moderate pain.   hydrocortisone cream 1 % Apply 1 application topically 2 (two) times daily as needed (wrists).   hydrocortisone valerate cream (WESTCORT) 0.2 % Apply 1 application topically 2 (two) times daily as needed (for itching). Cleanse behind ears with gentle cleanser and dry completely.   ipratropium-albuterol (DUONEB) 0.5-2.5 (3) MG/3ML SOLN Take 3 mLs by nebulization daily.   IPRATROPIUM-ALBUTEROL IN Inhale 1 vial into the lungs daily.   ketoconazole (NIZORAL) 2 % cream Apply 1 application  topically 2 (two) times daily as needed for irritation.   methocarbamol (ROBAXIN) 500 MG tablet Take 500 mg by mouth every 6 (six) hours as needed for muscle spasms.   nitrofurantoin (MACRODANTIN) 50 MG capsule Take 50 mg by mouth at bedtime.   Olopatadine HCl 0.2 % SOLN Place 1 drop into both eyes daily.   polyethylene glycol (MIRALAX / GLYCOLAX) 17 g packet Take 17 g by mouth 2 (two) times a week. Sunday and Wednesday   potassium chloride (MICRO-K) 10 MEQ CR capsule Take 10 mEq by mouth daily.   pramoxine (SARNA SENSITIVE) 1 % LOTN Apply to cheeks topically one time a day for rosy burning cheeks   predniSONE (DELTASONE) 10 MG tablet Take 10 mg by mouth daily.   pregabalin (LYRICA) 75 MG capsule Take 1 capsule (75 mg total) by mouth at bedtime.   Propylene Glycol (SYSTANE COMPLETE) 0.6 % SOLN Instill 1 drop in both eyes four times a day for DRY EYE   vitamin B-12 (CYANOCOBALAMIN) 1000 MCG tablet Take 1,000 mcg by mouth daily.   zolpidem (AMBIEN) 5 MG tablet Take 5 mg by  mouth at bedtime.   [DISCONTINUED] metoprolol tartrate (LOPRESSOR) 25 MG tablet Take 12.5 mg by mouth 2 (two) times daily.   No facility-administered encounter medications on file as of 08/26/2022.    Review of Systems  Constitutional:  Negative for appetite change, fatigue and fever.  HENT:  Positive for hearing loss. Negative for congestion and voice change.   Eyes:  Negative for visual disturbance.       C/o blurred vision, ? Color, ? Right eye peripheral vision loss-f/u Ophthalmology  Respiratory:  Positive for cough and shortness of breath. Negative for chest tightness and wheezing.        DOE, chronic hacking cough  Cardiovascular:  Positive for leg swelling. Negative for chest pain and palpitations.  Gastrointestinal:  Negative for abdominal pain and constipation.  Genitourinary:  Negative for dysuria and urgency.       Chronic, on and off  Musculoskeletal:  Positive for arthralgias, back pain, gait problem and myalgias. Negative for joint swelling.       New mid back pain, mostly in the muscle next to thoracic spine palpated.   Skin:  Negative for color change.       Top of the left 2nd toe bruise.   Neurological:  Negative for speech difficulty, weakness and light-headedness.  Psychiatric/Behavioral:  Positive for sleep disturbance. Negative for behavioral problems and hallucinations.        Chronic going to bed late, sleeping in  late.     Immunization History  Administered Date(s) Administered   Moderna SARS-COV2 Booster Vaccination 12/25/2019   Moderna Sars-Covid-2 Vaccination 02/17/2019, 03/17/2019   Pneumococcal Conjugate-13 09/01/2015   Pneumococcal Polysaccharide-23 07/07/1998   Td 08/21/2009   Pertinent  Health Maintenance Due  Topic Date Due   DEXA SCAN  Completed   INFLUENZA VACCINE  Discontinued      03/20/2021   12:00 AM 03/20/2021   12:00 PM 02/05/2022    9:16 AM 03/09/2022   10:01 AM 07/23/2022    3:44 PM  Fall Risk  Falls in the past year?   0 0 0  Was  there an injury with Fall?   0 0 0  Fall Risk Category Calculator   0 0 0  Fall Risk Category (Retired)   Low    (RETIRED) Patient Fall Risk Level High fall risk High fall risk High fall risk    Patient at Risk for Falls Due to   History of fall(s) No Fall Risks No Fall Risks  Fall risk Follow up   Falls evaluation completed Falls evaluation completed Falls evaluation completed   Functional Status Survey:    Vitals:   08/26/22 0825  BP: (!) 136/57  Pulse: 78  Resp: 18  Temp: (!) 97.1 F (36.2 C)  SpO2: 97%  Weight: 161 lb 8 oz (73.3 kg)  Height: 5' (1.524 m)   Body mass index is 31.54 kg/m. Physical Exam Vitals and nursing note reviewed.  Constitutional:      Appearance: Normal appearance.  HENT:     Head: Normocephalic and atraumatic.     Nose: Nose normal.     Mouth/Throat:     Mouth: Mucous membranes are moist.  Eyes:     Extraocular Movements: Extraocular movements intact.     Conjunctiva/sclera: Conjunctivae normal.     Pupils: Pupils are equal, round, and reactive to light.     Comments: Able to count my fingers 3 feet away from her eyes.   Cardiovascular:     Rate and Rhythm: Normal rate and regular rhythm.     Heart sounds: No murmur heard. Pulmonary:     Effort: Pulmonary effort is normal.     Breath sounds: Rales present. No wheezing.     Comments: Decreased air entry to both lungs. Needs O2 via [place] to maintain SatO2>90%. Moist rales bibasilar.  Chest:     Chest wall: No tenderness.  Abdominal:     General: Bowel sounds are normal.     Palpations: Abdomen is soft.     Tenderness: There is no abdominal tenderness.  Musculoskeletal:        General: Tenderness present.     Cervical back: Normal range of motion and neck supple.     Right lower leg: Edema present.     Left lower leg: Edema present.     Comments: 2+ edema BLE. R shoulder pain with overhead ROM. Better with mid back pain  Skin:    General: Skin is warm and dry.     Findings: Bruising  present.     Comments: Top of the left 2nd toe bruise, no pain, redness, warmth, or decreased ROM  Neurological:     General: No focal deficit present.     Mental Status: She is alert. Mental status is at baseline.     Motor: No weakness.     Coordination: Coordination normal.     Gait: Gait abnormal.     Comments: Oriented to person,  place.   Psychiatric:        Mood and Affect: Mood normal.        Behavior: Behavior normal.     Labs reviewed: Recent Labs    04/02/22 0000 07/31/22 1448  NA 141 137  K 4.5 3.6  CL 104 100  CO2 29* 27  GLUCOSE  --  112*  BUN 19 16  CREATININE 1.1 1.25*  CALCIUM 8.7 9.1   Recent Labs    07/31/22 1448  AST 20  ALT 19  ALKPHOS 78  BILITOT 1.2  PROT 6.1*  ALBUMIN 3.6   Recent Labs    07/27/22 0000 07/31/22 1448  WBC 8.1 7.0  HGB 12.3 13.3  HCT 37 41.6  MCV  --  95.2  PLT 194 179   Lab Results  Component Value Date   TSH 0.661 03/19/2021   Lab Results  Component Value Date   HGBA1C 6.0 (H) 03/19/2021   Lab Results  Component Value Date   CHOL 192 03/19/2021   HDL 69 03/19/2021   LDLCALC 107 (H) 03/19/2021   LDLDIRECT 114.7 08/18/2011   TRIG 78 03/19/2021   CHOLHDL 2.8 03/19/2021    Significant Diagnostic Results in last 30 days:  DG Chest Port 1 View  Result Date: 07/31/2022 CLINICAL DATA:  Shortness of breath. EXAM: PORTABLE CHEST 1 VIEW COMPARISON:  02/13/2016 FINDINGS: The heart size and mediastinal contours are within normal limits. Aortic atherosclerotic calcification incidentally noted. Both lungs are clear. The visualized skeletal structures are unremarkable. IMPRESSION: No active disease. Electronically Signed   By: Danae Orleans M.D.   On: 07/31/2022 15:42    Assessment/Plan Insomnia secondary to anxiety Will try Ambien 5mg  hs, observe.   Congestive heart failure (CHF) (HCC)  clinically presumed. 08/16/22 Echo mild aortic stenosis, normal left ventricular systolic function, no EF.  BNP in 300s, already on  Furosemide/Kcl  COPD (chronic obstructive pulmonary disease) with chronic bronchitis  intermittent chronic mild expiratory wheezes, c/o SOB, uses O2 more than prior,  takes Symbicort, Zyrtec, Mucinex, DuoNeb, prn Albuterol, cough is chronic, on Prednisone low dose. treated with higher dose of Prednisone for symptomatic control.   CVA (cerebral vascular accident) [facility]) Hx of CVA, CT x2 03/2021 showed no acute findings except age indeterminate right corona radiator infarct, small vessel changes. Neurology recommended no further work ups.  Macular degeneration  Macular degeneration, f/u [facility]: OU for Scotoma MD involving tcentral area. 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   Osteoarthritis  Chronic lower back, leg pain,  R shoulder pain, had Ortho eval, takes Lyrica   HTN (hypertension) Blood pressure is controlled,  taking  Metoprolol.   Atrophic vaginitis With urinary symptoms and hx of UTI,  on  Nitrofurantoin 50mg  qd for UTI suppression, Dr. Vernie Ammons Estrace vaginal cream for atrophic vaginitis.   ABLA (acute blood loss anemia) Hx of Hgb 6.8 in hospital s/p 2 units of PRBC,  Vit B12, Iron, Hgb 13.3 07/31/22  CKD (chronic kidney disease) stage 3, GFR 30-59 ml/min (HCC) Bun/creat 18/1.1 08/24/22  GERD   GERD/chronic gastritis/atrophic gastritis, GI bleed 02/2020, s/p EGD, per biopsy, takes Protonix. F/u GI prn     Family/ staff Communication: plan of care reviewed with the patient and charge nurse.   Labs/tests ordered:  none  Time spend 35 minutes.

## 2022-08-26 NOTE — Assessment & Plan Note (Signed)
takes MiraLax 2x/wk. Colace daily.  °

## 2022-08-26 NOTE — Assessment & Plan Note (Signed)
GERD/chronic gastritis/atrophic gastritis, GI bleed 02/2020, s/p EGD, per biopsy, takes Protonix. F/u GI prn ?

## 2022-08-26 NOTE — Assessment & Plan Note (Signed)
With urinary symptoms and hx of UTI,  on  Nitrofurantoin 50mg  qd for UTI suppression, Dr. Vernie Ammons Estrace vaginal cream for atrophic vaginitis.

## 2022-08-26 NOTE — Assessment & Plan Note (Signed)
Hx of CVA, CT x2 03/2021 showed no acute findings except age indeterminate right corona radiator infarct, small vessel changes. Neurology recommended no further work ups 

## 2022-08-26 NOTE — Assessment & Plan Note (Signed)
Macular degeneration, f/u [facility]: OU for Scotoma MD involving tcentral area. OS exudative age related MD 

## 2022-08-26 NOTE — Assessment & Plan Note (Signed)
intermittent chronic mild expiratory wheezes, c/o SOB, uses O2 more than prior,  takes Symbicort, Zyrtec, Mucinex, DuoNeb, prn Albuterol, cough is chronic, on Prednisone low dose. treated with higher dose of Prednisone for symptomatic control.  

## 2022-08-26 NOTE — Assessment & Plan Note (Signed)
Takes Lyrica, stable.

## 2022-08-26 NOTE — Assessment & Plan Note (Signed)
Chronic lower back, leg pain,  R shoulder pain, had Ortho eval, takes Lyrica  

## 2022-08-26 NOTE — Assessment & Plan Note (Signed)
Will try Ambien 5mg  hs, observe.

## 2022-08-26 NOTE — Assessment & Plan Note (Signed)
Hx of Hgb 6.8 in hospital s/p 2 units of PRBC,  Vit B12, Iron, Hgb 13.3 07/31/22 

## 2022-08-26 NOTE — Assessment & Plan Note (Signed)
Bun/creat 18/1.1 08/24/22

## 2022-08-26 NOTE — Assessment & Plan Note (Signed)
clinically presumed. 08/16/22 Echo mild aortic stenosis, normal left ventricular systolic function, no EF.  BNP in 300s, already on Furosemide/Kcl

## 2022-08-26 NOTE — Assessment & Plan Note (Signed)
Blood pressure is controlled, taking Metoprolol.  

## 2022-08-27 DIAGNOSIS — J449 Chronic obstructive pulmonary disease, unspecified: Secondary | ICD-10-CM | POA: Diagnosis not present

## 2022-08-28 DIAGNOSIS — J449 Chronic obstructive pulmonary disease, unspecified: Secondary | ICD-10-CM | POA: Diagnosis not present

## 2022-08-31 DIAGNOSIS — K219 Gastro-esophageal reflux disease without esophagitis: Secondary | ICD-10-CM | POA: Diagnosis not present

## 2022-08-31 DIAGNOSIS — F4381 Prolonged grief disorder: Secondary | ICD-10-CM | POA: Diagnosis not present

## 2022-08-31 DIAGNOSIS — R1013 Epigastric pain: Secondary | ICD-10-CM | POA: Diagnosis not present

## 2022-08-31 DIAGNOSIS — Z515 Encounter for palliative care: Secondary | ICD-10-CM | POA: Diagnosis not present

## 2022-08-31 DIAGNOSIS — R2243 Localized swelling, mass and lump, lower limb, bilateral: Secondary | ICD-10-CM | POA: Diagnosis not present

## 2022-08-31 DIAGNOSIS — I82403 Acute embolism and thrombosis of unspecified deep veins of lower extremity, bilateral: Secondary | ICD-10-CM | POA: Diagnosis not present

## 2022-08-31 DIAGNOSIS — J9621 Acute and chronic respiratory failure with hypoxia: Secondary | ICD-10-CM | POA: Diagnosis not present

## 2022-08-31 DIAGNOSIS — R296 Repeated falls: Secondary | ICD-10-CM | POA: Diagnosis not present

## 2022-08-31 DIAGNOSIS — J449 Chronic obstructive pulmonary disease, unspecified: Secondary | ICD-10-CM | POA: Diagnosis not present

## 2022-09-01 DIAGNOSIS — J449 Chronic obstructive pulmonary disease, unspecified: Secondary | ICD-10-CM | POA: Diagnosis not present

## 2022-09-03 DIAGNOSIS — J449 Chronic obstructive pulmonary disease, unspecified: Secondary | ICD-10-CM | POA: Diagnosis not present

## 2022-09-06 ENCOUNTER — Other Ambulatory Visit: Payer: Self-pay | Admitting: Orthopedic Surgery

## 2022-09-06 DIAGNOSIS — M159 Polyosteoarthritis, unspecified: Secondary | ICD-10-CM

## 2022-09-06 DIAGNOSIS — G629 Polyneuropathy, unspecified: Secondary | ICD-10-CM

## 2022-09-06 DIAGNOSIS — J449 Chronic obstructive pulmonary disease, unspecified: Secondary | ICD-10-CM | POA: Diagnosis not present

## 2022-09-06 MED ORDER — PREGABALIN 75 MG PO CAPS: 75.0000 mg | ORAL_CAPSULE | Freq: Every day | ORAL | 0 refills | Status: DC

## 2022-09-07 DIAGNOSIS — J449 Chronic obstructive pulmonary disease, unspecified: Secondary | ICD-10-CM | POA: Diagnosis not present

## 2022-09-09 DIAGNOSIS — J449 Chronic obstructive pulmonary disease, unspecified: Secondary | ICD-10-CM | POA: Diagnosis not present

## 2022-09-10 DIAGNOSIS — J449 Chronic obstructive pulmonary disease, unspecified: Secondary | ICD-10-CM | POA: Diagnosis not present

## 2022-09-13 DIAGNOSIS — J449 Chronic obstructive pulmonary disease, unspecified: Secondary | ICD-10-CM | POA: Diagnosis not present

## 2022-09-14 ENCOUNTER — Encounter: Payer: Self-pay | Admitting: Nurse Practitioner

## 2022-09-14 ENCOUNTER — Non-Acute Institutional Stay (SKILLED_NURSING_FACILITY): Payer: Medicare Other | Admitting: Nurse Practitioner

## 2022-09-14 DIAGNOSIS — J4489 Other specified chronic obstructive pulmonary disease: Secondary | ICD-10-CM

## 2022-09-14 DIAGNOSIS — D649 Anemia, unspecified: Secondary | ICD-10-CM | POA: Diagnosis not present

## 2022-09-14 DIAGNOSIS — M159 Polyosteoarthritis, unspecified: Secondary | ICD-10-CM

## 2022-09-14 DIAGNOSIS — K5901 Slow transit constipation: Secondary | ICD-10-CM | POA: Diagnosis not present

## 2022-09-14 DIAGNOSIS — E876 Hypokalemia: Secondary | ICD-10-CM | POA: Diagnosis not present

## 2022-09-14 DIAGNOSIS — N1831 Chronic kidney disease, stage 3a: Secondary | ICD-10-CM | POA: Diagnosis not present

## 2022-09-14 DIAGNOSIS — K219 Gastro-esophageal reflux disease without esophagitis: Secondary | ICD-10-CM | POA: Diagnosis not present

## 2022-09-14 DIAGNOSIS — M797 Fibromyalgia: Secondary | ICD-10-CM

## 2022-09-14 DIAGNOSIS — I509 Heart failure, unspecified: Secondary | ICD-10-CM

## 2022-09-14 DIAGNOSIS — I10 Essential (primary) hypertension: Secondary | ICD-10-CM | POA: Diagnosis not present

## 2022-09-14 DIAGNOSIS — N952 Postmenopausal atrophic vaginitis: Secondary | ICD-10-CM

## 2022-09-14 DIAGNOSIS — R442 Other hallucinations: Secondary | ICD-10-CM

## 2022-09-14 DIAGNOSIS — R7303 Prediabetes: Secondary | ICD-10-CM | POA: Diagnosis not present

## 2022-09-14 DIAGNOSIS — I63411 Cerebral infarction due to embolism of right middle cerebral artery: Secondary | ICD-10-CM

## 2022-09-14 DIAGNOSIS — J449 Chronic obstructive pulmonary disease, unspecified: Secondary | ICD-10-CM | POA: Diagnosis not present

## 2022-09-14 NOTE — Assessment & Plan Note (Signed)
takes Lyrica 

## 2022-09-14 NOTE — Assessment & Plan Note (Signed)
intermittent chronic mild expiratory wheezes, c/o SOB, uses O2 more than prior,  takes Symbicort, Zyrtec, Mucinex, DuoNeb, prn Albuterol, cough is chronic, on Prednisone low dose. treated with higher dose of Prednisone for symptomatic control.  

## 2022-09-14 NOTE — Assessment & Plan Note (Signed)
No long c/o

## 2022-09-14 NOTE — Assessment & Plan Note (Signed)
Weight stable, clinically presumed. 08/16/22 Echo mild aortic stenosis, normal left ventricular systolic function, no EF.  BNP in 364 08/12/22, already on Furosemide/Kcl

## 2022-09-14 NOTE — Progress Notes (Signed)
Location:  [facility] Room Number: NO/60/A Place of Service:  SNF (31) Provider:  Bridgid Printz X, NP  Patient Care Team: Ardyn Forge X, NP as PCP - General (Internal Medicine) Shadrick Senne X, NP as Nurse Practitioner (Internal Medicine)  Extended Emergency Contact Information Primary Emergency Contact: Trimmer,Jeff Home Phone: [phone] Relation: Son Secondary Emergency Contact: Culbreth Sr.,Christopher Home Phone: [phone] Relation: Relative  Code Status:  DNR Goals of care: Advanced Directive information    09/17/2022   11:00 AM  Advanced Directives  Does Patient Have a Medical Advance Directive? Yes  Type of Advance Directive Out of facility DNR (pink MOST or yellow form);Living will  Does patient want to make changes to medical advance directive? No - Patient declined     Chief Complaint  Patient presents with  . Medical Management of Chronic Issues    Routine Visit  . Quality Metric Gaps    Needs to discuss Pneumonia and Tdap vaccine.    HPI:  Pt is a 89 y.o. female seen today for medical management of chronic diseases.      CHF clinically presumed. 08/16/22 Echo mild aortic stenosis, normal left ventricular systolic function, no EF.  BNP in 364 08/12/22, already on Furosemide/Kcl             COPD, intermittent chronic mild expiratory wheezes, c/o SOB, uses O2 more than prior,  takes Symbicort, Zyrtec, Mucinex, DuoNeb, prn Albuterol, cough is chronic, on Prednisone low dose. treated with higher dose of Prednisone for symptomatic control.  Hx of CVA, CT x2 03/2021 showed no acute findings except age indeterminate right corona radiator infarct, small vessel changes. Neurology recommended no further work ups.              Macular degeneration, f/u [facility]: OU for [place] MD involving central area. 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              HTN,  off  Metoprolol.              Urinary symptoms, on  Nitrofurantoin 50mg  qd for UTI suppression, Dr. Vernie Ammons Estrace vaginal cream for atrophic vaginitis.              Anemia, Hx of Hgb 6.8 in hospital s/p 2 units of PRBC,  Vit B12, Iron, Hgb 13.3 07/31/22             CKD Bun/creat 18/1.1 08/24/22             GERD/chronic gastritis/atrophic gastritis, GI bleed 02/2020, s/p EGD, per biopsy, takes Protonix. F/u GI prn Fibromyalgia takes Lyrica             Tactile hallucinations, off meds. TSH 0.661 03/19/21             Constipation, takes MiraLax 2x/wk. Colace daily.              Prediabetes, Hgb a1c 6.6 08/12/22             Hypokalemia, K 4.1 08/24/22  Past Medical History:  Diagnosis Date  . Acute blood loss anemia 08/23/2013   04/13/16 TSH 1.20, Na 141, K 4.2, Bun 15, creat 0.79, wbc 5.5, Hgb 11.5, plt 283  . Anxiety   . Anxiety state 07/02/2007   Qualifier: Diagnosis of  By: Kriste Basque MD, Lonzo Cloud   .  Asthma   . 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 Cigarette smoker   . Colon polyps 2009   TWO TUBULAR ADENOMAS AND HYPERPLASTIC POLYPS  . Congestive heart failure (CHF) (HCC) 08/13/2022  . Constipation 09/14/2013  . COPD (chronic obstructive pulmonary disease) (HCC)   . Diverticulosis of colon   . DJD (degenerative joint disease)   . Dog bite of limb 07/14/2010   Dog bite 07/10/10 - dogs shots utd Last td was 08/2009  Localized tx only.    . Fibromyalgia   . GERD 12/19/2006   Qualifier: Diagnosis of  By: Renaldo Fiddler CMA, Marliss Czar  04/13/16 TSH 1.20, Na 141, K 4.2, Bun 15, creat 0.79, wbc 5.5, Hgb 11.5, plt 283   . GERD (gastroesophageal reflux disease)   . Hammer toe of second toe of left foot 04/08/2016   Left 2nd  . Hearing loss   . Hemorrhoids   . Hypercholesterolemia   . Hypertension   . Osteoarthritis 12/19/2006   Qualifier: Diagnosis of  By: Renaldo Fiddler CMA, Marliss Czar    . Osteoporosis    Past Surgical History:  Procedure Laterality Date  . BIOPSY  02/17/2020   Procedure: BIOPSY;  Surgeon: Lynann Bologna, MD;  Location: [facility];  Service: Endoscopy;;  . CATARACT EXTRACTION, BILATERAL  2009  . COLONOSCOPY  2009, 2006 , 2017  . ESOPHAGOGASTRODUODENOSCOPY (EGD) WITH PROPOFOL N/A 02/17/2020   Procedure: ESOPHAGOGASTRODUODENOSCOPY (EGD) WITH PROPOFOL;  Surgeon: Lynann Bologna, MD;  Location: [facility];  Service: Endoscopy;  Laterality: N/A;  . IR ANGIOGRAM SELECTIVE EACH ADDITIONAL VESSEL  02/17/2020  . IR ANGIOGRAM SELECTIVE EACH ADDITIONAL VESSEL  02/17/2020  . IR ANGIOGRAM SELECTIVE EACH ADDITIONAL VESSEL  02/17/2020  . IR ANGIOGRAM SELECTIVE EACH ADDITIONAL VESSEL  02/17/2020  . IR ANGIOGRAM SELECTIVE EACH ADDITIONAL VESSEL  02/17/2020  . IR ANGIOGRAM VISCERAL SELECTIVE  02/17/2020  . IR ANGIOGRAM VISCERAL SELECTIVE  02/17/2020  . IR US GUIDE VASC ACCESS RIGHT  02/17/2020  . stapes surgery     Dr Dorma Russell  . TONSILLECTOMY AND ADENOIDECTOMY     as a child    Allergies  Allergen Reactions  . Penicillins Anaphylaxis  . Bisphosphonates Other (See Comments)    [name]  . Influenza Vaccines     Unknown  . Simvastatin     Unknown   . Trazodone And Nefazodone     Felt her throat was closing up/kept her awake  . Sulfonamide Derivatives Rash and Other (See Comments)    blisters    Outpatient Encounter Medications as of 09/14/2022  Medication Sig  . acetaminophen (TYLENOL) 325 MG tablet Take 650 mg by mouth every 4 (four) hours as needed for fever.  Marland Kitchen albuterol (VENTOLIN HFA) 108 (90 Base) MCG/ACT inhaler Inhale into the lungs every 6 (six) hours as needed for wheezing or shortness of breath.  . benzonatate (TESSALON) 100 MG capsule Take 100 mg by mouth 2 (two) times daily.  . budesonide-formoterol (SYMBICORT) 80-4.5 MCG/ACT inhaler Inhale 1 puff into the lungs daily.  . calcium carbonate (TUMS EX) 750 MG chewable tablet Chew 750 mg by mouth daily.   . cholecalciferol (VITAMIN D) 1000 UNITS tablet Take 1,000 Units by mouth daily.  . cyclobenzaprine (FLEXERIL) 5 MG tablet Take 2.5 mg by  mouth at bedtime.  . diclofenac Sodium (VOLTAREN) 1 % GEL Apply 2 g topically 3 (three) times daily as needed. Apply to left wrist, elbow for pain.  Marland Kitchen docusate sodium (COLACE) 100 MG capsule Take 100 mg by mouth  2 (two) times daily.  Marland Kitchen escitalopram (LEXAPRO) 5 MG tablet Take 5 mg by mouth daily.  . ferrous sulfate 325 (65 FE) MG tablet Take 325 mg by mouth. Once A Day on Mon, Wed, Fri  . furosemide (LASIX) 20 MG tablet Take 40 mg by mouth daily.  Marland Kitchen HYDROcodone-acetaminophen (NORCO/VICODIN) 5-325 MG tablet Take 1 tablet by mouth every 6 (six) hours as needed for moderate pain.  . hydrocortisone cream 1 % Apply 1 application topically 2 (two) times daily as needed (wrists).  . hydrocortisone valerate cream (WESTCORT) 0.2 % Apply 1 application topically 2 (two) times daily as needed (for itching). Cleanse behind ears with gentle cleanser and dry completely.  Marland Kitchen ipratropium-albuterol (DUONEB) 0.5-2.5 (3) MG/3ML SOLN Take 3 mLs by nebulization daily.  Maximino [place] IN Inhale 1 vial into the lungs daily.  Marland Kitchen ketoconazole (NIZORAL) 2 % cream Apply 1 application  topically 2 (two) times daily as needed for irritation.  . methocarbamol (ROBAXIN) 500 MG tablet Take 500 mg by mouth every 6 (six) hours as needed for muscle spasms.  . nitrofurantoin (MACRODANTIN) 50 MG capsule Take 50 mg by mouth at bedtime.  . Olopatadine HCl 0.2 % SOLN Place 1 drop into both eyes daily.  . polyethylene glycol (MIRALAX / GLYCOLAX) 17 g packet Take 17 g by mouth 2 (two) times a week. Sunday and Wednesday  . potassium chloride (MICRO-K) 10 MEQ CR capsule Take 20 mEq by mouth 2 (two) times daily.  . pramoxine (SARNA SENSITIVE) 1 % LOTN Apply to cheeks topically one time a day for rosy burning cheeks  . predniSONE (DELTASONE) 10 MG tablet Take 5 mg by mouth daily.  . pregabalin (LYRICA) 75 MG capsule Take 1 capsule (75 mg total) by mouth at bedtime.  Marland Kitchen Propylene Glycol (SYSTANE COMPLETE) 0.6 % SOLN Instill 1 drop in both  eyes four times a day for DRY EYE  . vitamin B-12 (CYANOCOBALAMIN) 1000 MCG tablet Take 1,000 mcg by mouth daily.  Marland Kitchen zolpidem (AMBIEN) 5 MG tablet Take 5 mg by mouth at bedtime.   No facility-administered encounter medications on file as of 09/14/2022.    Review of Systems  Constitutional:  Negative for appetite change, fatigue, fever and unexpected weight change.  HENT:  Positive for hearing loss. Negative for congestion and voice change.   Eyes:  Negative for visual disturbance.       C/o blurred vision, ? Color, ? Right eye peripheral vision loss-f/u Ophthalmology  Respiratory:  Positive for cough and shortness of breath. Negative for chest tightness and wheezing.        DOE, chronic hacking cough  Cardiovascular:  Positive for leg swelling. Negative for chest pain and palpitations.  Gastrointestinal:  Negative for abdominal pain and constipation.  Genitourinary:  Negative for dysuria and urgency.       Chronic, on and off  Musculoskeletal:  Positive for arthralgias, back pain, gait problem and myalgias. Negative for joint swelling.       New mid back pain, mostly in the muscle next to thoracic spine palpated.   Skin:  Negative for color change.  Neurological:  Negative for speech difficulty, weakness and light-headedness.  Psychiatric/Behavioral:  Positive for sleep disturbance. Negative for behavioral problems and hallucinations.        Chronic going to bed late, sleeping in late.     Immunization History  Administered Date(s) Administered  . Moderna SARS-COV2 Booster Vaccination 12/25/2019  . Moderna Sars-Covid-2 Vaccination 02/17/2019, 03/17/2019  . Pneumococcal Conjugate-13 09/01/2015  .  Pneumococcal Polysaccharide-23 07/07/1998  . Td 08/21/2009   Pertinent  Health Maintenance Due  Topic Date Due  . DEXA SCAN  Completed  . INFLUENZA VACCINE  Discontinued      03/20/2021   12:00 AM 03/20/2021   12:00 PM 02/05/2022    9:16 AM 03/09/2022   10:01 AM 07/23/2022    3:44 PM   Fall Risk  Falls in the past year?   0 0 0  Was there an injury with Fall?   0 0 0  Fall Risk Category Calculator   0 0 0  Fall Risk Category (Retired)   Low    (RETIRED) Patient Fall Risk Level High fall risk High fall risk High fall risk    Patient at Risk for Falls Due to   History of fall(s) No Fall Risks No Fall Risks  Fall risk Follow up   Falls evaluation completed Falls evaluation completed Falls evaluation completed   Functional Status Survey:    Vitals:   09/14/22 1115  BP: (!) 137/51  Pulse: (!) 55  Resp: 18  Temp: (!) 97.1 F (36.2 C)  SpO2: 94%  Weight: 166 lb 3.2 oz (75.4 kg)  Height: 5' (1.524 m)   Body mass index is 32.46 kg/m. Physical Exam Vitals and nursing note reviewed.  Constitutional:      Appearance: Normal appearance.  HENT:     Head: Normocephalic and atraumatic.     Nose: Nose normal.     Mouth/Throat:     Mouth: Mucous membranes are moist.  Eyes:     Extraocular Movements: Extraocular movements intact.     Conjunctiva/sclera: Conjunctivae normal.     Pupils: Pupils are equal, round, and reactive to light.     Comments: Able to count my fingers 3 feet away from her eyes.   Cardiovascular:     Rate and Rhythm: Normal rate and regular rhythm.     Heart sounds: No murmur heard. Pulmonary:     Effort: Pulmonary effort is normal.     Breath sounds: Rales present. No wheezing.     Comments: Decreased air entry to both lungs. Needs O2 via [place] to maintain SatO2>90%. Moist rales bibasilar.  Chest:     Chest wall: No tenderness.  Abdominal:     General: Bowel sounds are normal.     Palpations: Abdomen is soft.     Tenderness: There is no abdominal tenderness.  Musculoskeletal:        General: Tenderness present.     Cervical back: Normal range of motion and neck supple.     Right lower leg: Edema present.     Left lower leg: Edema present.     Comments: 2+ edema BLE. R shoulder pain with overhead ROM. Better with mid back pain  Skin:     General: Skin is warm and dry.     Findings: No bruising.  Neurological:     General: No focal deficit present.     Mental Status: She is alert. Mental status is at baseline.     Motor: No weakness.     Coordination: Coordination normal.     Gait: Gait abnormal.     Comments: Oriented to person, place.   Psychiatric:        Mood and Affect: Mood normal.        Behavior: Behavior normal.    Labs reviewed: Recent Labs    07/31/22 1448 08/13/22 0000 08/24/22 0000  NA 137 142 141  K 3.6  3.1* 4.1  CL 100 101 103  CO2 27 29* 31*  GLUCOSE 112*  --   --   BUN 16 22* 18  CREATININE 1.25* 1.2* 1.1  CALCIUM 9.1 8.8 8.8   Recent Labs    07/31/22 1448  AST 20  ALT 19  ALKPHOS 78  BILITOT 1.2  PROT 6.1*  ALBUMIN 3.6   Recent Labs    07/27/22 0000 07/31/22 1448  WBC 8.1 7.0  HGB 12.3 13.3  HCT 37 41.6  MCV  --  95.2  PLT 194 179   Lab Results  Component Value Date   TSH 1.03 08/13/2022   Lab Results  Component Value Date   HGBA1C 6.6 08/13/2022   Lab Results  Component Value Date   CHOL 192 03/19/2021   HDL 69 03/19/2021   LDLCALC 107 (H) 03/19/2021   LDLDIRECT 114.7 08/18/2011   TRIG 78 03/19/2021   CHOLHDL 2.8 03/19/2021    Significant Diagnostic Results in last 30 days:  No results found.  Assessment/Plan HTN (hypertension) Low Dbp, off Metoprolol, only takes Furosemide.   Atrophic vaginitis With history of UTI< on  Nitrofurantoin 50mg  qd for UTI suppression, Dr. Vernie Ammons. Estrace vaginal cream for atrophic vaginitis.   Chronic anemia Hx of Hgb 6.8 in hospital s/p 2 units of PRBC,  Vit B12, Iron, Hgb 13.3 07/31/22  CKD (chronic kidney disease) stage 3, GFR 30-59 ml/min (HCC) Bun/creat 18/1.1 08/24/22  GERD  GERD/chronic gastritis/atrophic gastritis, GI bleed 02/2020, s/p EGD, per biopsy, takes Protonix. F/u GI prn  Fibromyalgia takes Lyrica  Tactile hallucination No long c/o  Slow transit constipation Stable, takes MiraLax 2x/wk. Colace  daily.   Prediabetes Hgb a1c 6.6 08/12/22  Hypokalemia K 4.1 08/24/22   Osteoarthritis Chronic lower back, leg pain,  R shoulder pain, had Ortho eval, takes Lyrica, Methocarbamol.   Congestive heart failure (CHF) (HCC) Weight stable, clinically presumed. 08/16/22 Echo mild aortic stenosis, normal left ventricular systolic function, no EF.  BNP in 364 08/12/22, already on Furosemide/Kcl  COPD (chronic obstructive pulmonary disease) with chronic bronchitis  intermittent chronic mild expiratory wheezes, c/o SOB, uses O2 more than prior,  takes Symbicort, Zyrtec, Mucinex, DuoNeb, prn Albuterol, cough is chronic, on Prednisone low dose. treated with higher dose of Prednisone for symptomatic control.   CVA (cerebral vascular accident) [facility])  CT x2 03/2021 showed no acute findings except age indeterminate right corona radiator infarct, small vessel changes. Neurology recommended no further work ups.     Family/ staff Communication: plan of care reviewed with the patient and charge nurse.   Labs/tests ordered:  none  Time spend 35 minutes

## 2022-09-14 NOTE — Assessment & Plan Note (Signed)
Chronic lower back, leg pain,  R shoulder pain, had Ortho eval, takes Lyrica, Methocarbamol.

## 2022-09-14 NOTE — Assessment & Plan Note (Signed)
Stable,  takes MiraLax 2x/wk. Colace daily.

## 2022-09-14 NOTE — Assessment & Plan Note (Signed)
CT x2 03/2021 showed no acute findings except age indeterminate right corona radiator infarct, small vessel changes. Neurology recommended no further work ups.

## 2022-09-14 NOTE — Assessment & Plan Note (Signed)
Low Dbp, off Metoprolol, only takes Furosemide.

## 2022-09-14 NOTE — Assessment & Plan Note (Signed)
Hgb a1c 6.6 08/12/22

## 2022-09-14 NOTE — Assessment & Plan Note (Signed)
Hx of Hgb 6.8 in hospital s/p 2 units of PRBC,  Vit B12, Iron, Hgb 13.3 07/31/22

## 2022-09-14 NOTE — Assessment & Plan Note (Signed)
With history of UTI< on  Nitrofurantoin 50mg  qd for UTI suppression, Dr. Vernie Ammons. Estrace vaginal cream for atrophic vaginitis.

## 2022-09-14 NOTE — Assessment & Plan Note (Signed)
Bun/creat 18/1.1 08/24/22

## 2022-09-14 NOTE — Assessment & Plan Note (Signed)
K 4.1 08/24/22

## 2022-09-14 NOTE — Assessment & Plan Note (Signed)
GERD/chronic gastritis/atrophic gastritis, GI bleed 02/2020, s/p EGD, per biopsy, takes Protonix. F/u GI prn 

## 2022-09-15 DIAGNOSIS — J449 Chronic obstructive pulmonary disease, unspecified: Secondary | ICD-10-CM | POA: Diagnosis not present

## 2022-09-16 DIAGNOSIS — R2689 Other abnormalities of gait and mobility: Secondary | ICD-10-CM | POA: Diagnosis not present

## 2022-09-16 DIAGNOSIS — M6281 Muscle weakness (generalized): Secondary | ICD-10-CM | POA: Diagnosis not present

## 2022-09-16 DIAGNOSIS — M5459 Other low back pain: Secondary | ICD-10-CM | POA: Diagnosis not present

## 2022-09-17 ENCOUNTER — Non-Acute Institutional Stay: Payer: Self-pay | Admitting: Nurse Practitioner

## 2022-09-17 ENCOUNTER — Encounter: Payer: Self-pay | Admitting: Nurse Practitioner

## 2022-09-17 DIAGNOSIS — M797 Fibromyalgia: Secondary | ICD-10-CM

## 2022-09-17 DIAGNOSIS — D649 Anemia, unspecified: Secondary | ICD-10-CM

## 2022-09-17 DIAGNOSIS — I509 Heart failure, unspecified: Secondary | ICD-10-CM | POA: Diagnosis not present

## 2022-09-17 DIAGNOSIS — N1831 Chronic kidney disease, stage 3a: Secondary | ICD-10-CM | POA: Diagnosis not present

## 2022-09-17 DIAGNOSIS — J4489 Other specified chronic obstructive pulmonary disease: Secondary | ICD-10-CM | POA: Diagnosis not present

## 2022-09-17 DIAGNOSIS — I63411 Cerebral infarction due to embolism of right middle cerebral artery: Secondary | ICD-10-CM | POA: Diagnosis not present

## 2022-09-17 NOTE — Assessment & Plan Note (Signed)
CT x2 03/2021 showed no acute findings except age indeterminate right corona radiator infarct, small vessel changes. Neurology recommended no further work ups.

## 2022-09-17 NOTE — Assessment & Plan Note (Signed)
clinically presumed. 08/16/22 Echo mild aortic stenosis, normal left ventricular systolic function, no EF.  BNP in 364 08/12/22, already on Furosemide/Kcl

## 2022-09-17 NOTE — Assessment & Plan Note (Signed)
Hx of Hgb 6.8 in hospital s/p 2 units of PRBC,  Vit B12, Iron, Hgb 13.3 07/31/22

## 2022-09-17 NOTE — Assessment & Plan Note (Signed)
takes Lyrica 

## 2022-09-17 NOTE — Assessment & Plan Note (Signed)
Bun/creat 18/1.1 08/24/22

## 2022-09-17 NOTE — Progress Notes (Unsigned)
Location:  [facility] Room Number: [phone] Place of Service:  SNF (31) Provider:  Westly Hinnant X, NP  Patient Care Team: Maddyson Keil X, NP as PCP - General (Internal Medicine) Meisha Salone X, NP as Nurse Practitioner (Internal Medicine)  Extended Emergency Contact Information Primary Emergency Contact: Kinter,Jeff Home Phone: [phone] Relation: Son Secondary Emergency Contact: Culbreth Sr.,Christopher Home Phone: [phone] Relation: Relative  Code Status:  DNR Goals of care: Advanced Directive information    09/17/2022   11:00 AM  Advanced Directives  Does Patient Have a Medical Advance Directive? Yes  Type of Advance Directive Out of facility DNR (pink MOST or yellow form);Living will  Does patient want to make changes to medical advance directive? No - Patient declined     Chief Complaint  Patient presents with  . Acute Visit    Congestive Cough    HPI:  Pt is a 89 y.o. female seen today for an acute visit for Congestive Cough, no noted phlegm, denied chest pain, she is afebrile, negative COVID   CHF clinically presumed. 08/16/22 Echo mild aortic stenosis, normal left ventricular systolic function, no EF.  BNP in 364 08/12/22, already on Furosemide/Kcl             COPD, intermittent chronic mild expiratory wheezes, c/o SOB, uses O2 more than prior,  takes Symbicort, Zyrtec, Mucinex, DuoNeb, prn Albuterol, cough is chronic, on Prednisone low dose. treated with higher dose of Prednisone for symptomatic control.  Hx of CVA, CT x2 03/2021 showed no acute findings except age indeterminate right corona radiator infarct, small vessel changes. Neurology recommended no further work ups.              Macular degeneration, f/u [facility]: OU for [place] MD involving central area. 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              HTN, off  Metoprolol.               Urinary symptoms, on  Nitrofurantoin 50mg  qd for UTI suppression, Dr. Vernie Ammons Estrace vaginal cream for atrophic vaginitis.              Anemia, Hx of Hgb 6.8 in hospital s/p 2 units of PRBC,  Vit B12, Iron, Hgb 13.3 07/31/22             CKD Bun/creat 18/1.1 08/24/22             GERD/chronic gastritis/atrophic gastritis, GI bleed 02/2020, s/p EGD, per biopsy, takes Protonix. F/u GI prn Fibromyalgia takes Lyrica             Tactile hallucinations, off meds. TSH 0.661 03/19/21             Constipation, takes MiraLax 2x/wk. Colace daily.              Prediabetes, Hgb a1c 6.6 08/12/22             Hypokalemia, K 4.1 08/24/22   Past Medical History:  Diagnosis Date  . Acute blood loss anemia 08/23/2013   04/13/16 TSH 1.20, Na 141, K 4.2, Bun 15, creat 0.79, wbc 5.5, Hgb 11.5, plt 283  . Anxiety   . Anxiety state 07/02/2007   Qualifier: Diagnosis of  By: Kriste Basque MD, Lonzo Cloud   . Asthma   . 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 Cigarette smoker   . Colon polyps 2009   TWO TUBULAR ADENOMAS AND HYPERPLASTIC POLYPS  . Congestive heart failure (CHF) (HCC) 08/13/2022  . Constipation 09/14/2013  . COPD (chronic obstructive pulmonary disease) (HCC)   . Diverticulosis of colon   . DJD (degenerative joint disease)   . Dog bite of limb 07/14/2010   Dog bite 07/10/10 - dogs shots utd Last td was 08/2009  Localized tx only.    . Fibromyalgia   . GERD 12/19/2006   Qualifier: Diagnosis of  By: Renaldo Fiddler CMA, Marliss Czar  04/13/16 TSH 1.20, Na 141, K 4.2, Bun 15, creat 0.79, wbc 5.5, Hgb 11.5, plt 283   . GERD (gastroesophageal reflux disease)   . Hammer toe of second toe of left foot 04/08/2016   Left 2nd  . Hearing loss   . Hemorrhoids   . Hypercholesterolemia   . Hypertension   . Osteoarthritis 12/19/2006   Qualifier: Diagnosis of  By: Renaldo Fiddler CMA, Marliss Czar    . Osteoporosis    Past Surgical History:  Procedure Laterality Date  . BIOPSY  02/17/2020   Procedure: BIOPSY;  Surgeon: Lynann Bologna, MD;  Location: [facility]  ENDOSCOPY;  Service: Endoscopy;;  . CATARACT EXTRACTION, BILATERAL  2009  . COLONOSCOPY  2009, 2006 , 2017  . ESOPHAGOGASTRODUODENOSCOPY (EGD) WITH PROPOFOL N/A 02/17/2020   Procedure: ESOPHAGOGASTRODUODENOSCOPY (EGD) WITH PROPOFOL;  Surgeon: Lynann Bologna, MD;  Location: [facility] ENDOSCOPY;  Service: Endoscopy;  Laterality: N/A;  . IR ANGIOGRAM SELECTIVE EACH ADDITIONAL VESSEL  02/17/2020  . IR ANGIOGRAM SELECTIVE EACH ADDITIONAL VESSEL  02/17/2020  . IR ANGIOGRAM SELECTIVE EACH ADDITIONAL VESSEL  02/17/2020  . IR ANGIOGRAM SELECTIVE EACH ADDITIONAL VESSEL  02/17/2020  . IR ANGIOGRAM SELECTIVE EACH ADDITIONAL VESSEL  02/17/2020  . IR ANGIOGRAM VISCERAL SELECTIVE  02/17/2020  . IR ANGIOGRAM VISCERAL SELECTIVE  02/17/2020  . IR US GUIDE VASC ACCESS RIGHT  02/17/2020  . stapes surgery     Dr Dorma Russell  . TONSILLECTOMY AND ADENOIDECTOMY     as a child    Allergies  Allergen Reactions  . Penicillins Anaphylaxis  . Bisphosphonates Other (See Comments)    [name]  . Influenza Vaccines     Unknown  . Simvastatin     Unknown   . Trazodone And Nefazodone     Felt her throat was closing up/kept her awake  . Sulfonamide Derivatives Rash and Other (See Comments)    blisters    Outpatient Encounter Medications as of 09/17/2022  Medication Sig  . acetaminophen (TYLENOL) 325 MG tablet Take 650 mg by mouth every 4 (four) hours as needed for fever.  Marland Kitchen albuterol (VENTOLIN HFA) 108 (90 Base) MCG/ACT inhaler Inhale into the lungs every 6 (six) hours as needed for wheezing or shortness of breath.  . benzonatate (TESSALON) 100 MG capsule Take 100 mg by mouth 2 (two) times daily.  . budesonide-formoterol (SYMBICORT) 80-4.5 MCG/ACT inhaler Inhale 1 puff into the lungs daily.  . calcium carbonate (TUMS EX) 750 MG chewable tablet Chew 750 mg by mouth daily.   . cholecalciferol (VITAMIN D) 1000 UNITS tablet Take 1,000 Units by mouth daily.  . cyclobenzaprine (FLEXERIL) 5 MG tablet Take 2.5 mg by mouth at bedtime.  .  diclofenac Sodium (VOLTAREN) 1 % GEL Apply 2 g topically 3 (three) times daily as needed. Apply to left wrist, elbow for pain.  Marland Kitchen docusate sodium (COLACE) 100 MG capsule Take 100 mg by mouth 2 (two) times daily.  Marland Kitchen escitalopram (  LEXAPRO) 5 MG tablet Take 5 mg by mouth daily.  . ferrous sulfate 325 (65 FE) MG tablet Take 325 mg by mouth. Once A Day on Mon, Wed, Fri  . furosemide (LASIX) 20 MG tablet Take 40 mg by mouth daily.  Marland Kitchen HYDROcodone-acetaminophen (NORCO/VICODIN) 5-325 MG tablet Take 1 tablet by mouth every 6 (six) hours as needed for moderate pain.  . hydrocortisone cream 1 % Apply 1 application topically 2 (two) times daily as needed (wrists).  . hydrocortisone valerate cream (WESTCORT) 0.2 % Apply 1 application topically 2 (two) times daily as needed (for itching). Cleanse behind ears with gentle cleanser and dry completely.  Marland Kitchen ipratropium-albuterol (DUONEB) 0.5-2.5 (3) MG/3ML SOLN Take 3 mLs by nebulization daily.  Maximino [place] IN Inhale 1 vial into the lungs daily.  Marland Kitchen ketoconazole (NIZORAL) 2 % cream Apply 1 application  topically 2 (two) times daily as needed for irritation.  . methocarbamol (ROBAXIN) 500 MG tablet Take 500 mg by mouth every 6 (six) hours as needed for muscle spasms.  . nitrofurantoin (MACRODANTIN) 50 MG capsule Take 50 mg by mouth at bedtime.  . Olopatadine HCl 0.2 % SOLN Place 1 drop into both eyes daily.  . polyethylene glycol (MIRALAX / GLYCOLAX) 17 g packet Take 17 g by mouth 2 (two) times a week. Sunday and Wednesday  . potassium chloride (MICRO-K) 10 MEQ CR capsule Take 20 mEq by mouth 2 (two) times daily.  . pramoxine (SARNA SENSITIVE) 1 % LOTN Apply to cheeks topically one time a day for rosy burning cheeks  . predniSONE (DELTASONE) 10 MG tablet Take 5 mg by mouth daily.  . pregabalin (LYRICA) 75 MG capsule Take 1 capsule (75 mg total) by mouth at bedtime.  Marland Kitchen Propylene Glycol (SYSTANE COMPLETE) 0.6 % SOLN Instill 1 drop in both eyes four times a day  for DRY EYE  . vitamin B-12 (CYANOCOBALAMIN) 1000 MCG tablet Take 1,000 mcg by mouth daily.  Marland Kitchen zolpidem (AMBIEN) 5 MG tablet Take 5 mg by mouth at bedtime.   No facility-administered encounter medications on file as of 09/17/2022.    Review of Systems  Constitutional:  Negative for appetite change, fatigue, fever and unexpected weight change.  HENT:  Positive for hearing loss. Negative for congestion and voice change.   Eyes:  Negative for visual disturbance.       C/o blurred vision, ? Color, ? Right eye peripheral vision loss-f/u Ophthalmology  Respiratory:  Positive for cough and shortness of breath. Negative for chest tightness and wheezing.        DOE, chronic hacking cough  Cardiovascular:  Positive for leg swelling. Negative for chest pain and palpitations.  Gastrointestinal:  Negative for abdominal pain and constipation.  Genitourinary:  Negative for dysuria and urgency.       Chronic, on and off  Musculoskeletal:  Positive for arthralgias, back pain, gait problem and myalgias. Negative for joint swelling.       New mid back pain, mostly in the muscle next to thoracic spine palpated.   Skin:  Negative for color change.  Neurological:  Negative for speech difficulty, weakness and light-headedness.  Psychiatric/Behavioral:  Positive for sleep disturbance. Negative for behavioral problems and hallucinations.        Chronic going to bed late, sleeping in late.     Immunization History  Administered Date(s) Administered  . Moderna SARS-COV2 Booster Vaccination 12/25/2019  . Moderna Sars-Covid-2 Vaccination 02/17/2019, 03/17/2019  . Pneumococcal Conjugate-13 09/01/2015  . Pneumococcal Polysaccharide-23 07/07/1998  .  Td 08/21/2009   Pertinent  Health Maintenance Due  Topic Date Due  . DEXA SCAN  Completed  . INFLUENZA VACCINE  Discontinued      03/20/2021   12:00 AM 03/20/2021   12:00 PM 02/05/2022    9:16 AM 03/09/2022   10:01 AM 07/23/2022    3:44 PM  Fall Risk  Falls in the  past year?   0 0 0  Was there an injury with Fall?   0 0 0  Fall Risk Category Calculator   0 0 0  Fall Risk Category (Retired)   Low    (RETIRED) Patient Fall Risk Level High fall risk High fall risk High fall risk    Patient at Risk for Falls Due to   History of fall(s) No Fall Risks No Fall Risks  Fall risk Follow up   Falls evaluation completed Falls evaluation completed Falls evaluation completed   Functional Status Survey:    Vitals:   09/17/22 1053  BP: 132/66  Pulse: 88  Resp: 18  Temp: (!) 97.3 F (36.3 C)  SpO2: 95%  Weight: 166 lb 6.4 oz (75.5 kg)  Height: 5' (1.524 m)   Body mass index is 32.5 kg/m. Physical Exam Vitals and nursing note reviewed.  Constitutional:      Appearance: Normal appearance.  HENT:     Head: Normocephalic and atraumatic.     Nose: Nose normal.     Mouth/Throat:     Mouth: Mucous membranes are moist.  Eyes:     Extraocular Movements: Extraocular movements intact.     Conjunctiva/sclera: Conjunctivae normal.     Pupils: Pupils are equal, round, and reactive to light.     Comments: Able to count my fingers 3 feet away from her eyes.   Cardiovascular:     Rate and Rhythm: Normal rate and regular rhythm.     Heart sounds: No murmur heard. Pulmonary:     Effort: Pulmonary effort is normal.     Breath sounds: Rhonchi and rales present. No wheezing.     Comments: Decreased air entry to both lungs. Needs O2 via [place] to maintain SatO2>90%. Moist rales bibasilar. Coarse rhonchi posterior mid to lower lungs.  Chest:     Chest wall: No tenderness.  Abdominal:     General: Bowel sounds are normal.     Palpations: Abdomen is soft.     Tenderness: There is no abdominal tenderness.  Musculoskeletal:        General: Tenderness present.     Cervical back: Normal range of motion and neck supple.     Right lower leg: Edema present.     Left lower leg: Edema present.     Comments: 1+ edema BLE. R shoulder pain with overhead ROM. Better with mid back  pain  Skin:    General: Skin is warm and dry.     Findings: No bruising.  Neurological:     General: No focal deficit present.     Mental Status: She is alert. Mental status is at baseline.     Motor: No weakness.     Coordination: Coordination normal.     Gait: Gait abnormal.     Comments: Oriented to person, place.   Psychiatric:        Mood and Affect: Mood normal.        Behavior: Behavior normal.    Labs reviewed: Recent Labs    07/31/22 1448 08/13/22 0000 08/24/22 0000  NA 137 142 141  K 3.6 3.1* 4.1  CL 100 101 103  CO2 27 29* 31*  GLUCOSE 112*  --   --   BUN 16 22* 18  CREATININE 1.25* 1.2* 1.1  CALCIUM 9.1 8.8 8.8   Recent Labs    07/31/22 1448  AST 20  ALT 19  ALKPHOS 78  BILITOT 1.2  PROT 6.1*  ALBUMIN 3.6   Recent Labs    07/27/22 0000 07/31/22 1448  WBC 8.1 7.0  HGB 12.3 13.3  HCT 37 41.6  MCV  --  95.2  PLT 194 179   Lab Results  Component Value Date   TSH 1.03 08/13/2022   Lab Results  Component Value Date   HGBA1C 6.6 08/13/2022   Lab Results  Component Value Date   CHOL 192 03/19/2021   HDL 69 03/19/2021   LDLCALC 107 (H) 03/19/2021   LDLDIRECT 114.7 08/18/2011   TRIG 78 03/19/2021   CHOLHDL 2.8 03/19/2021    Significant Diagnostic Results in last 30 days:  No results found.  Assessment/Plan COPD (chronic obstructive pulmonary disease) with chronic bronchitis intermittent chronic mild expiratory wheezes, c/o SOB, uses O2 more than prior,  takes Symbicort, Zyrtec, Mucinex, DuoNeb, prn Albuterol, cough is chronic, on Prednisone low dose. treated with higher dose of Prednisone for symptomatic control.  no noted phlegm, denied chest pain, she is afebrile, negative COVID CXR ap/lateral to evaluate further, adding Doxycycline 100mg  bid x 7 days, Tessalon 100mg  tid x 5 day  Congestive heart failure (CHF) (HCC) clinically presumed. 08/16/22 Echo mild aortic stenosis, normal left ventricular systolic function, no EF.  BNP in 364  08/12/22, already on Furosemide/Kcl  CVA (cerebral vascular accident) [facility]) CT x2 03/2021 showed no acute findings except age indeterminate right corona radiator infarct, small vessel changes. Neurology recommended no further work ups.  Chronic anemia Hx of Hgb 6.8 in hospital s/p 2 units of PRBC,  Vit B12, Iron, Hgb 13.3 07/31/22  CKD (chronic kidney disease) stage 3, GFR 30-59 ml/min (HCC) Bun/creat 18/1.1 08/24/22  Fibromyalgia  takes Lyrica     Family/ staff Communication: plan of care reviewed with the patient and charge nurse  Labs/tests ordered:  CXR ap/lateral  Time spend 35 minutes.

## 2022-09-17 NOTE — Assessment & Plan Note (Signed)
intermittent chronic mild expiratory wheezes, c/o SOB, uses O2 more than prior,  takes Symbicort, Zyrtec, Mucinex, DuoNeb, prn Albuterol, cough is chronic, on Prednisone low dose. treated with higher dose of Prednisone for symptomatic control.  no noted phlegm, denied chest pain, she is afebrile, negative COVID CXR ap/lateral to evaluate further, adding Doxycycline 100mg  bid x 7 days, Tessalon 100mg  tid x 5 day

## 2022-09-19 DIAGNOSIS — R2689 Other abnormalities of gait and mobility: Secondary | ICD-10-CM | POA: Diagnosis not present

## 2022-09-19 DIAGNOSIS — M5459 Other low back pain: Secondary | ICD-10-CM | POA: Diagnosis not present

## 2022-09-19 DIAGNOSIS — M6281 Muscle weakness (generalized): Secondary | ICD-10-CM | POA: Diagnosis not present

## 2022-09-20 ENCOUNTER — Encounter: Payer: Self-pay | Admitting: Orthopedic Surgery

## 2022-09-20 ENCOUNTER — Non-Acute Institutional Stay (SKILLED_NURSING_FACILITY): Payer: Medicare Other | Admitting: Orthopedic Surgery

## 2022-09-20 DIAGNOSIS — R2689 Other abnormalities of gait and mobility: Secondary | ICD-10-CM | POA: Diagnosis not present

## 2022-09-20 DIAGNOSIS — I509 Heart failure, unspecified: Secondary | ICD-10-CM

## 2022-09-20 DIAGNOSIS — M5459 Other low back pain: Secondary | ICD-10-CM | POA: Diagnosis not present

## 2022-09-20 DIAGNOSIS — M6281 Muscle weakness (generalized): Secondary | ICD-10-CM | POA: Diagnosis not present

## 2022-09-20 MED ORDER — FUROSEMIDE 40 MG PO TABS
40.0000 mg | ORAL_TABLET | Freq: Every day | ORAL | Status: DC
Start: 2022-09-20 — End: 2023-11-09

## 2022-09-20 NOTE — Progress Notes (Signed)
Location:   [facility]  Nursing Home Room Number: 60-A Place of Service:  SNF (31) Provider:  Hazle Nordmann, NP  PCP: Mast, Man X, NP  Patient Care Team: Mast, Man X, NP as PCP - General (Internal Medicine) Mast, Man X, NP as Nurse Practitioner (Internal Medicine)  Extended Emergency Contact Information Primary Emergency Contact: Miyamoto,Jeff Home Phone: [phone] Relation: Son Secondary Emergency Contact: Culbreth Sr.,Christopher Home Phone: [phone] Relation: Relative  Code Status: DNR  Goals of care: Advanced Directive information    09/20/2022    3:06 PM  Advanced Directives  Does Patient Have a Medical Advance Directive? Yes  Type of Advance Directive Living will;Out of facility DNR (pink MOST or yellow form)  Does patient want to make changes to medical advance directive? No - Patient declined     Chief Complaint  Patient presents with   Acute Visit    Shortness of breath     HPI:  Lisa King is a 89 y.o. female seen today for an acute visit due to ongoing dry cough and shortness of breath.   H/o CHF, COPD (exacerbation with hospitalization 07/31/2022) and pneumonia. 08/02 she was prescribed doxycycline and tessalon pearls due to acute dry cough and sob. CXR noted mild CHF, no effusion or infiltrates. Today, nursing reports ongoing cough and shortness of breath. She remains on 2 liters oxygen, sats> 90%. She receives furosemide 40 mg daily. BNP 394.9 07/31/2022. Echo LVEF 75% 03/2021. Afebrile. Vitals stable.   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     Fibromyalgia    GERD 12/19/2006   Qualifier: Diagnosis of  By: Renaldo Fiddler CMA, Marliss Czar  04/13/16 TSH 1.20, Na 141, K 4.2, Bun 15, creat 0.79, wbc 5.5, Hgb 11.5, plt 283    GERD (gastroesophageal reflux disease)    Hammer toe of second toe of left foot 04/08/2016   Left 2nd   Hearing loss    Hemorrhoids    Hypercholesterolemia    Hypertension    Osteoarthritis 12/19/2006   Qualifier: Diagnosis of  By: Renaldo Fiddler CMA, Leigh     Osteoporosis    Past Surgical History:  Procedure Laterality Date   BIOPSY  02/17/2020   Procedure: BIOPSY;  Surgeon: Lynann Bologna, MD;  Location: [facility];  Service: Endoscopy;;   CATARACT EXTRACTION, BILATERAL  2009   COLONOSCOPY  2009, 2006 , 2017   ESOPHAGOGASTRODUODENOSCOPY (EGD) WITH PROPOFOL N/A 02/17/2020   Procedure: ESOPHAGOGASTRODUODENOSCOPY (EGD) WITH PROPOFOL;  Surgeon: Lynann Bologna, MD;  Location: [facility];  Service: Endoscopy;  Laterality: N/A;   IR ANGIOGRAM SELECTIVE EACH ADDITIONAL VESSEL  02/17/2020   IR ANGIOGRAM SELECTIVE EACH  ADDITIONAL VESSEL  02/17/2020   IR ANGIOGRAM SELECTIVE EACH ADDITIONAL VESSEL  02/17/2020   IR ANGIOGRAM SELECTIVE EACH ADDITIONAL VESSEL  02/17/2020   IR ANGIOGRAM SELECTIVE EACH ADDITIONAL VESSEL  02/17/2020   IR ANGIOGRAM VISCERAL SELECTIVE  02/17/2020   IR ANGIOGRAM VISCERAL SELECTIVE  02/17/2020   IR US GUIDE VASC ACCESS RIGHT  02/17/2020   stapes surgery     Dr Dorma Russell   TONSILLECTOMY AND ADENOIDECTOMY     as a child    Allergies  Allergen Reactions   Penicillins Anaphylaxis   Bisphosphonates Other (See Comments)    Lisa King states INTOL   Influenza Vaccines     Unknown   Simvastatin     Unknown    Trazodone And  Nefazodone     Felt her throat was closing up/kept her awake   Sulfonamide Derivatives Rash and Other (See Comments)    blisters    Allergies as of 09/20/2022       Reactions   Penicillins Anaphylaxis   Bisphosphonates Other (See Comments)   Lisa King states INTOL   Influenza Vaccines    Unknown   Simvastatin    Unknown    Trazodone And Nefazodone    Felt her throat was closing up/kept her awake   Sulfonamide Derivatives Rash, Other (See Comments)   blisters        Medication List        Accurate as of September 20, 2022  3:14 PM. If you have any questions, ask your nurse or doctor.          acetaminophen 325 MG tablet Commonly known as: TYLENOL Take 650 mg by mouth every 4 (four) hours as needed for fever.   albuterol 108 (90 Base) MCG/ACT inhaler Commonly known as: VENTOLIN HFA Inhale into the lungs every 6 (six) hours as needed for wheezing or shortness of breath.   Ambien 5 MG tablet Generic drug: zolpidem Take 5 mg by mouth at bedtime.   benzonatate 100 MG capsule Commonly known as: TESSALON Take 100 mg by mouth 2 (two) times daily.   benzonatate 100 MG capsule Commonly known as: TESSALON Take 100 mg by mouth 3 (three) times daily.   budesonide-formoterol 80-4.5 MCG/ACT inhaler Commonly known as: SYMBICORT Inhale 1 puff into the lungs daily.   calcium carbonate 750 MG chewable tablet Commonly known as: TUMS EX Chew 750 mg by mouth daily.   cholecalciferol 1000 units tablet Commonly known as: VITAMIN D Take 1,000 Units by mouth daily.   cyanocobalamin 1000 MCG tablet Commonly known as: VITAMIN B12 Take 1,000 mcg by mouth daily.   cyclobenzaprine 5 MG tablet Commonly known as: FLEXERIL Take 2.5 mg by mouth as needed.   docusate sodium 100 MG capsule Commonly known as: COLACE Take 100 mg by mouth 2 (two) times daily.   DOXYCYCLINE HYCLATE PO Take 100 mg by mouth in the morning and at bedtime.   escitalopram 5 MG tablet Commonly known as:  LEXAPRO Take 5 mg by mouth daily.   ferrous sulfate 325 (65 FE) MG tablet Take 325 mg by mouth. Once A Day on Mon, Wed, Fri   furosemide 40 MG tablet Commonly known as: LASIX Take 40 mg by mouth daily. What changed: Another medication with the same name was removed. Continue taking this medication, and follow the directions you see here. Changed by: Octavia Heir   HYDROcodone-acetaminophen 5-325 MG tablet Commonly known as: NORCO/VICODIN Take 1 tablet by mouth every 6 (six) hours as needed for moderate  pain.   hydrocortisone cream 1 % Apply 1 application topically 2 (two) times daily as needed (wrists).   hydrocortisone valerate cream 0.2 % Commonly known as: WESTCORT Apply 1 application topically 2 (two) times daily as needed (for itching). Cleanse behind ears with gentle cleanser and dry completely.   ipratropium-albuterol 0.5-2.5 (3) MG/3ML Soln Commonly known as: DUONEB Take 3 mLs by nebulization daily.   IPRATROPIUM-ALBUTEROL IN Inhale 1 vial into the lungs daily.   ketoconazole 2 % cream Commonly known as: NIZORAL Apply 1 application  topically 2 (two) times daily as needed for irritation.   methocarbamol 500 MG tablet Commonly known as: ROBAXIN Take 500 mg by mouth every 6 (six) hours as needed for muscle spasms.   nitrofurantoin 50 MG capsule Commonly known as: MACRODANTIN Take 50 mg by mouth at bedtime.   Olopatadine HCl 0.2 % Soln Place 1 drop into both eyes daily.   polyethylene glycol 17 g packet Commonly known as: MIRALAX / GLYCOLAX Take 17 g by mouth 2 (two) times a week. Sunday and Wednesday   potassium chloride 20 MEQ packet Commonly known as: KLOR-CON Take 20 mEq by mouth 2 (two) times daily. What changed: Another medication with the same name was removed. Continue taking this medication, and follow the directions you see here. Changed by: Octavia Heir   predniSONE 5 MG tablet Commonly known as: DELTASONE Take 5 mg by mouth daily with  breakfast. What changed: Another medication with the same name was removed. Continue taking this medication, and follow the directions you see here. Changed by: Octavia Heir   pregabalin 75 MG capsule Commonly known as: LYRICA Take 1 capsule (75 mg total) by mouth at bedtime.   Sarna Sensitive 1 % Lotn Generic drug: pramoxine Apply to cheeks topically one time a day for rosy burning cheeks   Systane Complete 0.6 % Soln Generic drug: Propylene Glycol Instill 1 drop in both eyes four times a day for DRY EYE   Voltaren 1 % Gel Generic drug: diclofenac Sodium Apply 2 g topically every 8 (eight) hours as needed.        Review of Systems  Constitutional:  Positive for activity change. Negative for appetite change and fever.  HENT:  Negative for congestion and sore throat.   Respiratory:  Positive for cough and shortness of breath. Negative for wheezing.   Cardiovascular:  Positive for leg swelling. Negative for chest pain.  Gastrointestinal:  Negative for abdominal distention and abdominal pain.  Genitourinary:  Negative for dysuria.  Musculoskeletal:  Positive for gait problem.  Neurological:  Positive for weakness. Negative for dizziness and light-headedness.  Psychiatric/Behavioral:  Negative for dysphoric mood. The patient is not nervous/anxious.     Immunization History  Administered Date(s) Administered   Moderna SARS-COV2 Booster Vaccination 12/25/2019   Moderna Sars-Covid-2 Vaccination 02/17/2019, 03/17/2019   Pneumococcal Conjugate-13 09/01/2015   Pneumococcal Polysaccharide-23 07/07/1998   Td 08/21/2009   Pertinent  Health Maintenance Due  Topic Date Due   DEXA SCAN  Completed   INFLUENZA VACCINE  Discontinued      03/20/2021   12:00 AM 03/20/2021   12:00 PM 02/05/2022    9:16 AM 03/09/2022   10:01 AM 07/23/2022    3:44 PM  Fall Risk  Falls in the past year?   0 0 0  Was there an injury with Fall?   0 0 0  Fall Risk Category Calculator   0 0 0  Fall Risk  Category (Retired)  Low    (RETIRED) Patient Fall Risk Level High fall risk High fall risk High fall risk    Patient at Risk for Falls Due to   History of fall(s) No Fall Risks No Fall Risks  Fall risk Follow up   Falls evaluation completed Falls evaluation completed Falls evaluation completed   Functional Status Survey:    Vitals:   09/20/22 1452  BP: 132/66  Pulse: 88  Resp: 18  Temp: (!) 97.3 F (36.3 C)  SpO2: 93%  Weight: 175 lb 1.6 oz (79.4 kg)  Height: 5' (1.524 m)   Body mass index is 34.2 kg/m. Physical Exam Vitals reviewed.  Constitutional:      General: She is not in acute distress. HENT:     Head: Normocephalic.     Nose: Nose normal.     Mouth/Throat:     Mouth: Mucous membranes are moist.  Eyes:     General:        Right eye: No discharge.        Left eye: No discharge.  Cardiovascular:     Rate and Rhythm: Normal rate and regular rhythm.     Pulses: Normal pulses.     Heart sounds: Normal heart sounds.  Pulmonary:     Effort: Pulmonary effort is normal. No respiratory distress.     Breath sounds: Examination of the right-upper field reveals rales. Examination of the left-upper field reveals rales. Examination of the right-middle field reveals rales. Examination of the left-middle field reveals rales. Rales present. No wheezing.     Comments: 2 liters  Abdominal:     General: Bowel sounds are normal.     Palpations: Abdomen is soft.  Musculoskeletal:     Cervical back: Neck supple.     Right lower leg: Edema present.     Left lower leg: Edema present.     Comments: BLE 1+ pitting  Lymphadenopathy:     Cervical: No cervical adenopathy.  Skin:    General: Skin is warm.     Capillary Refill: Capillary refill takes less than 2 seconds.  Neurological:     General: No focal deficit present.     Mental Status: She is alert. Mental status is at baseline.     Motor: Weakness present.     Gait: Gait abnormal.  Psychiatric:        Mood and Affect: Mood  normal.     Labs reviewed: Recent Labs    07/31/22 1448 08/13/22 0000 08/24/22 0000  NA 137 142 141  K 3.6 3.1* 4.1  CL 100 101 103  CO2 27 29* 31*  GLUCOSE 112*  --   --   BUN 16 22* 18  CREATININE 1.25* 1.2* 1.1  CALCIUM 9.1 8.8 8.8   Recent Labs    07/31/22 1448  AST 20  ALT 19  ALKPHOS 78  BILITOT 1.2  PROT 6.1*  ALBUMIN 3.6   Recent Labs    07/27/22 0000 07/31/22 1448  WBC 8.1 7.0  HGB 12.3 13.3  HCT 37 41.6  MCV  --  95.2  PLT 194 179   Lab Results  Component Value Date   TSH 1.03 08/13/2022   Lab Results  Component Value Date   HGBA1C 6.6 08/13/2022   Lab Results  Component Value Date   CHOL 192 03/19/2021   HDL 69 03/19/2021   LDLCALC 107 (H) 03/19/2021   LDLDIRECT 114.7 08/18/2011   TRIG 78 03/19/2021   CHOLHDL 2.8 03/19/2021  Significant Diagnostic Results in last 30 days:  No results found.  Assessment/Plan 1. Acute on chronic congestive heart failure, unspecified heart failure type (HCC) - 08/02 acute dry cough and sob - CXR revealed mild CHF, no effusion or infiltrates - BNP 394 07/31/2022 - rales noted to middle/upper lobes - ? Weight gain 08/02 175 lbs> 166 lbs 07/17 - give extra furosemide 40 mg x 2 days - BMP and BNP 08/06 - furosemide (LASIX) 40 MG tablet; Take 1 tablet (40 mg total) by mouth daily for 2 days.    Family/ staff Communication: plan discussed with patient and nurse  Labs/tests ordered:  BMP and BNP 08/06

## 2022-09-21 DIAGNOSIS — R2689 Other abnormalities of gait and mobility: Secondary | ICD-10-CM | POA: Diagnosis not present

## 2022-09-21 DIAGNOSIS — I5020 Unspecified systolic (congestive) heart failure: Secondary | ICD-10-CM | POA: Diagnosis not present

## 2022-09-21 DIAGNOSIS — M6281 Muscle weakness (generalized): Secondary | ICD-10-CM | POA: Diagnosis not present

## 2022-09-21 DIAGNOSIS — M5459 Other low back pain: Secondary | ICD-10-CM | POA: Diagnosis not present

## 2022-09-21 DIAGNOSIS — I10 Essential (primary) hypertension: Secondary | ICD-10-CM | POA: Diagnosis not present

## 2022-09-22 DIAGNOSIS — M6281 Muscle weakness (generalized): Secondary | ICD-10-CM | POA: Diagnosis not present

## 2022-09-22 DIAGNOSIS — M5459 Other low back pain: Secondary | ICD-10-CM | POA: Diagnosis not present

## 2022-09-22 DIAGNOSIS — R2689 Other abnormalities of gait and mobility: Secondary | ICD-10-CM | POA: Diagnosis not present

## 2022-09-23 DIAGNOSIS — R2689 Other abnormalities of gait and mobility: Secondary | ICD-10-CM | POA: Diagnosis not present

## 2022-09-23 DIAGNOSIS — M6281 Muscle weakness (generalized): Secondary | ICD-10-CM | POA: Diagnosis not present

## 2022-09-23 DIAGNOSIS — M5459 Other low back pain: Secondary | ICD-10-CM | POA: Diagnosis not present

## 2022-09-24 ENCOUNTER — Encounter: Payer: Self-pay | Admitting: Nurse Practitioner

## 2022-09-24 ENCOUNTER — Encounter: Payer: Self-pay | Admitting: Family Medicine

## 2022-09-24 ENCOUNTER — Non-Acute Institutional Stay (SKILLED_NURSING_FACILITY): Payer: Medicare Other | Admitting: Nurse Practitioner

## 2022-09-24 DIAGNOSIS — J4489 Other specified chronic obstructive pulmonary disease: Secondary | ICD-10-CM

## 2022-09-24 DIAGNOSIS — N952 Postmenopausal atrophic vaginitis: Secondary | ICD-10-CM | POA: Diagnosis not present

## 2022-09-24 DIAGNOSIS — R5381 Other malaise: Secondary | ICD-10-CM

## 2022-09-24 DIAGNOSIS — D62 Acute posthemorrhagic anemia: Secondary | ICD-10-CM | POA: Diagnosis not present

## 2022-09-24 DIAGNOSIS — I509 Heart failure, unspecified: Secondary | ICD-10-CM | POA: Diagnosis not present

## 2022-09-24 DIAGNOSIS — I63411 Cerebral infarction due to embolism of right middle cerebral artery: Secondary | ICD-10-CM | POA: Diagnosis not present

## 2022-09-24 DIAGNOSIS — K219 Gastro-esophageal reflux disease without esophagitis: Secondary | ICD-10-CM

## 2022-09-24 DIAGNOSIS — K5901 Slow transit constipation: Secondary | ICD-10-CM

## 2022-09-24 DIAGNOSIS — M159 Polyosteoarthritis, unspecified: Secondary | ICD-10-CM | POA: Diagnosis not present

## 2022-09-24 DIAGNOSIS — N1831 Chronic kidney disease, stage 3a: Secondary | ICD-10-CM

## 2022-09-24 NOTE — Assessment & Plan Note (Signed)
 Chronic lower back, leg pain,  R shoulder pain, had Ortho eval, takes Lyrica, Methocarbamol.

## 2022-09-24 NOTE — Assessment & Plan Note (Signed)
CT x2 03/2021 showed no acute findings except age indeterminate right corona radiator infarct, small vessel changes. Neurology recommended no further work ups.

## 2022-09-24 NOTE — Assessment & Plan Note (Signed)
GERD/chronic gastritis/atrophic gastritis, GI bleed 02/2020, s/p EGD, per biopsy, takes Protonix. F/u GI prn 

## 2022-09-24 NOTE — Assessment & Plan Note (Signed)
Bun/creat 22/1.00 09/21/22

## 2022-09-24 NOTE — Assessment & Plan Note (Signed)
clinically presumed. 08/16/22 Echo mild aortic stenosis, normal left ventricular systolic function, no EF.  BNP in 364 08/12/22>>248 09/21/22,  already on Furosemide/Kcl, extra dose of Furosemide 40mg  8/5 and 8/6, ? no weight change, cough, rales posterior mid to lower lungs, O2 desaturation, tachypnea Will adding extra dose Furosemide 40mg /Kcl at 2pm x 3 days

## 2022-09-24 NOTE — Assessment & Plan Note (Signed)
Stable,  takes MiraLax 2x/wk. Colace daily.

## 2022-09-24 NOTE — Assessment & Plan Note (Signed)
intermittent chronic mild expiratory wheezes, c/o SOB, uses O2 more than prior,  takes Symbicort, Zyrtec, Mucinex, DuoNeb, prn Albuterol, cough is chronic, on Prednisone low dose. treated with higher dose of Prednisone for symptomatic control. completed 7 day course of Doxy today.

## 2022-09-24 NOTE — Progress Notes (Unsigned)
Location:  [facility] Room Number: 60-A Place of Service:  SNF (31) Provider:  Rc Amison X, NP  Patient Care Team: Cinnamon Morency X, NP as PCP - General (Internal Medicine) Bode Pieper X, NP as Nurse Practitioner (Internal Medicine)  Extended Emergency Contact Information Primary Emergency Contact: Shiplett,Jeff Home Phone: [phone] Relation: Son Secondary Emergency Contact: Culbreth Sr.,Christopher Home Phone: [phone] Relation: Relative  Code Status:  DNR Goals of care: Advanced Directive information    09/24/2022    8:46 AM  Advanced Directives  Does Patient Have a Medical Advance Directive? Yes  Type of Advance Directive Living will;Out of facility DNR (pink MOST or yellow form)  Does patient want to make changes to medical advance directive? No - Patient declined     Chief Complaint  Patient presents with   Acute Visit    O2 desaturation    HPI:  Pt is a 89 y.o. female seen today for an acute visit for deconditioning.      CHF clinically presumed. 08/16/22 Echo mild aortic stenosis, normal left ventricular systolic function, no EF.  BNP in 364 08/12/22>>248 09/21/22,  already on Furosemide/Kcl, extra dose of Furosemide 40mg  8/5 and 8/6, ? no weight change, cough, rales posterior mid to lower lungs, O2 desaturation, tachypnea.              COPD, intermittent chronic mild expiratory wheezes, c/o SOB, uses O2 more than prior,  takes Symbicort, Zyrtec, Mucinex, DuoNeb, prn Albuterol, cough is chronic, on Prednisone low dose. treated with higher dose of Prednisone for symptomatic control. completed 7 day course of Doxy today.  Hx of CVA, CT x2 03/2021 showed no acute findings except age indeterminate right corona radiator infarct, small vessel changes. Neurology recommended no further work ups.              Macular degeneration, f/u [facility]: OU for [place] MD involving central area. OS exudative age related MD             Chronic lower back, leg pain,   R shoulder pain, had Ortho eval, takes Lyrica, Methocarbamol.              HTN, off  Metoprolol.              Urinary symptoms, on  Nitrofurantoin 50mg  qd for UTI suppression, Dr. Vernie Ammons Hx of Estrace vaginal cream for atrophic vaginitis.              Anemia, Hx of Hgb 6.8 in hospital s/p 2 units of PRBC,  Vit B12, Iron, Hgb 13.3 07/31/22             CKD Bun/creat 22/1.00 09/21/22             GERD/chronic gastritis/atrophic gastritis, GI bleed 02/2020, s/p EGD, per biopsy, takes Protonix. F/u GI prn Fibromyalgia takes Lyrica             Tactile hallucinations, off meds. TSH 0.661 03/19/21             Constipation, takes MiraLax 2x/wk. Colace daily.              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     Fibromyalgia    GERD 12/19/2006   Qualifier: Diagnosis of  By: Renaldo Fiddler CMA, Marliss Czar  04/13/16 TSH 1.20, Na 141, K 4.2, Bun 15, creat 0.79, wbc 5.5, Hgb 11.5, plt 283    GERD (gastroesophageal reflux disease)    Hammer toe of second toe of left foot 04/08/2016   Left 2nd   Hearing loss    Hemorrhoids    Hypercholesterolemia    Hypertension    Osteoarthritis 12/19/2006   Qualifier: Diagnosis of  By: Renaldo Fiddler CMA, Leigh     Osteoporosis    Past Surgical History:  Procedure  Laterality Date   BIOPSY  02/17/2020   Procedure: BIOPSY;  Surgeon: Lynann Bologna, MD;  Location: [facility];  Service: Endoscopy;;   CATARACT EXTRACTION, BILATERAL  2009   COLONOSCOPY  2009, 2006 , 2017   ESOPHAGOGASTRODUODENOSCOPY (EGD) WITH PROPOFOL N/A 02/17/2020   Procedure: ESOPHAGOGASTRODUODENOSCOPY (EGD) WITH PROPOFOL;  Surgeon: Lynann Bologna, MD;  Location: [facility];  Service: Endoscopy;  Laterality: N/A;   IR ANGIOGRAM SELECTIVE EACH ADDITIONAL VESSEL  02/17/2020   IR ANGIOGRAM SELECTIVE EACH ADDITIONAL VESSEL  02/17/2020   IR ANGIOGRAM SELECTIVE EACH ADDITIONAL VESSEL  02/17/2020   IR ANGIOGRAM SELECTIVE EACH ADDITIONAL VESSEL  02/17/2020   IR ANGIOGRAM SELECTIVE EACH ADDITIONAL VESSEL  02/17/2020   IR ANGIOGRAM VISCERAL SELECTIVE  02/17/2020   IR ANGIOGRAM VISCERAL SELECTIVE  02/17/2020   IR US GUIDE VASC ACCESS RIGHT  02/17/2020   stapes surgery     Dr Dorma Russell   TONSILLECTOMY AND ADENOIDECTOMY     as a child    Allergies  Allergen Reactions   Penicillins Anaphylaxis   Bisphosphonates Other (See Comments)    [name]   Influenza Vaccines     Unknown   Simvastatin     Unknown    Trazodone And Nefazodone     Felt her throat was closing up/kept her awake   Sulfonamide Derivatives Rash and Other (See Comments)    blisters    Outpatient Encounter Medications as of 09/24/2022  Medication Sig   acetaminophen (TYLENOL) 325 MG tablet Take 650 mg by mouth every 4 (four) hours as needed for fever.   albuterol (VENTOLIN HFA) 108 (90 Base) MCG/ACT inhaler Inhale into the lungs every 6 (six) hours as needed for wheezing or shortness of breath.   benzonatate (TESSALON) 100 MG capsule Take 100 mg by mouth 2 (two) times daily.   benzonatate (TESSALON) 100 MG capsule Take 100 mg by mouth 3 (three) times daily.   budesonide-formoterol (SYMBICORT) 80-4.5 MCG/ACT inhaler Inhale 1 puff into the lungs daily.   calcium carbonate (TUMS EX) 750 MG chewable tablet Chew 750 mg by mouth daily.     cholecalciferol (VITAMIN D) 1000 UNITS tablet Take 1,000 Units by mouth daily.   cyclobenzaprine (FLEXERIL) 5 MG tablet Take 2.5 mg by mouth as needed.   diclofenac Sodium (VOLTAREN)  1 % GEL Apply 2 g topically every 8 (eight) hours as needed.   docusate sodium (COLACE) 100 MG capsule Take 100 mg by mouth 2 (two) times daily.   DOXYCYCLINE HYCLATE PO Take 100 mg by mouth in the morning and at bedtime.   escitalopram (LEXAPRO) 5 MG tablet Take 5 mg by mouth daily.   ferrous sulfate 325 (65 FE) MG tablet Take 325 mg by mouth. Once A Day on Mon, Wed, Fri   furosemide (LASIX) 40 MG tablet Take 40 mg by mouth daily.   HYDROcodone-acetaminophen (NORCO/VICODIN) 5-325 MG tablet Take 1 tablet by mouth every 6 (six) hours as needed for moderate pain.   hydrocortisone cream 1 % Apply 1 application topically 2 (two) times daily as needed (wrists).   hydrocortisone valerate cream (WESTCORT) 0.2 % Apply 1 application topically 2 (two) times daily as needed (for itching). Cleanse behind ears with gentle cleanser and dry completely.   ipratropium-albuterol (DUONEB) 0.5-2.5 (3) MG/3ML SOLN Take 3 mLs by nebulization daily.   IPRATROPIUM-ALBUTEROL IN Inhale 1 vial into the lungs daily.   ketoconazole (NIZORAL) 2 % cream Apply 1 application  topically 2 (two) times daily as needed for irritation.   methocarbamol (ROBAXIN) 500 MG tablet Take 500 mg by mouth every 6 (six) hours as needed for muscle spasms.   nitrofurantoin (MACRODANTIN) 50 MG capsule Take 50 mg by mouth at bedtime.   Olopatadine HCl 0.2 % SOLN Place 1 drop into both eyes daily.   polyethylene glycol (MIRALAX / GLYCOLAX) 17 g packet Take 17 g by mouth 2 (two) times a week. Sunday and Wednesday   potassium chloride (KLOR-CON) 20 MEQ packet Take 20 mEq by mouth 2 (two) times daily.   pramoxine (SARNA SENSITIVE) 1 % LOTN Apply to cheeks topically one time a day for rosy burning cheeks   predniSONE (DELTASONE) 5 MG tablet Take 5 mg by mouth daily with  breakfast.   pregabalin (LYRICA) 75 MG capsule Take 1 capsule (75 mg total) by mouth at bedtime.   Propylene Glycol (SYSTANE COMPLETE) 0.6 % SOLN Instill 1 drop in both eyes four times a day for DRY EYE   vitamin B-12 (CYANOCOBALAMIN) 1000 MCG tablet Take 1,000 mcg by mouth daily.   zolpidem (AMBIEN) 5 MG tablet Take 5 mg by mouth at bedtime.   furosemide (LASIX) 40 MG tablet Take 1 tablet (40 mg total) by mouth daily for 2 days.   No facility-administered encounter medications on file as of 09/24/2022.    Review of Systems  Constitutional:  Positive for appetite change and fatigue. Negative for fever.  HENT:  Positive for hearing loss. Negative for congestion and voice change.   Eyes:  Negative for visual disturbance.       C/o blurred vision, ? Color, ? Right eye peripheral vision loss-f/u Ophthalmology  Respiratory:  Positive for cough and shortness of breath. Negative for chest tightness and wheezing.        DOE, chronic hacking cough  Cardiovascular:  Positive for leg swelling. Negative for chest pain and palpitations.  Gastrointestinal:  Negative for abdominal pain and constipation.  Genitourinary:  Negative for dysuria and urgency.       Chronic, on and off  Musculoskeletal:  Positive for arthralgias, back pain, gait problem and myalgias. Negative for joint swelling.       New mid back pain, mostly in the muscle next to thoracic spine palpated.   Skin:  Negative for color change.  Neurological:  Negative for speech  difficulty, weakness and light-headedness.  Psychiatric/Behavioral:  Positive for sleep disturbance. Negative for behavioral problems and hallucinations.        Chronic going to bed late, sleeping in late.     Immunization History  Administered Date(s) Administered   Moderna SARS-COV2 Booster Vaccination 12/25/2019   Moderna Sars-Covid-2 Vaccination 02/17/2019, 03/17/2019   Pneumococcal Conjugate-13 09/01/2015   Pneumococcal Polysaccharide-23 07/07/1998   Td  08/21/2009   Pertinent  Health Maintenance Due  Topic Date Due   DEXA SCAN  Completed   INFLUENZA VACCINE  Discontinued      03/20/2021   12:00 AM 03/20/2021   12:00 PM 02/05/2022    9:16 AM 03/09/2022   10:01 AM 07/23/2022    3:44 PM  Fall Risk  Falls in the past year?   0 0 0  Was there an injury with Fall?   0 0 0  Fall Risk Category Calculator   0 0 0  Fall Risk Category (Retired)   Low    (RETIRED) Patient Fall Risk Level High fall risk High fall risk High fall risk    Patient at Risk for Falls Due to   History of fall(s) No Fall Risks No Fall Risks  Fall risk Follow up   Falls evaluation completed Falls evaluation completed Falls evaluation completed   Functional Status Survey:    Vitals:   09/24/22 0833  BP: 133/69  Pulse: 84  Resp: 18  Temp: (!) 97.3 F (36.3 C)  SpO2: 95%  Weight: 175 lb 1.6 oz (79.4 kg)  Height: 5' (1.524 m)   Body mass index is 34.2 kg/m. Physical Exam Vitals and nursing note reviewed.  Constitutional:      Comments: Fatigue, sleepy  HENT:     Head: Normocephalic and atraumatic.     Nose: Nose normal.     Mouth/Throat:     Mouth: Mucous membranes are moist.  Eyes:     Extraocular Movements: Extraocular movements intact.     Conjunctiva/sclera: Conjunctivae normal.     Pupils: Pupils are equal, round, and reactive to light.     Comments: Able to count my fingers 3 feet away from her eyes.   Cardiovascular:     Rate and Rhythm: Normal rate and regular rhythm.     Heart sounds: No murmur heard. Pulmonary:     Effort: Pulmonary effort is normal.     Breath sounds: Rhonchi and rales present. No wheezing.     Comments: Decreased air entry to both lungs. Needs O2 via [place] to maintain SatO2>90%. Moist rales bibasilar. Coarse rhonchi posterior mid to lower lungs.  Chest:     Chest wall: No tenderness.  Abdominal:     General: Bowel sounds are normal.     Palpations: Abdomen is soft.     Tenderness: There is no abdominal tenderness.   Musculoskeletal:        General: Tenderness present.     Cervical back: Normal range of motion and neck supple.     Right lower leg: Edema present.     Left lower leg: Edema present.     Comments: mild edema BLE. R shoulder pain with overhead ROM. Better with mid back pain  Skin:    General: Skin is warm and dry.     Findings: No bruising.  Neurological:     General: No focal deficit present.     Mental Status: She is alert. Mental status is at baseline.     Motor: No weakness.  Coordination: Coordination normal.     Gait: Gait abnormal.     Comments: Oriented to person, place.   Psychiatric:        Mood and Affect: Mood normal.        Behavior: Behavior normal.     Labs reviewed: Recent Labs    07/31/22 1448 08/13/22 0000 08/24/22 0000  NA 137 142 141  K 3.6 3.1* 4.1  CL 100 101 103  CO2 27 29* 31*  GLUCOSE 112*  --   --   BUN 16 22* 18  CREATININE 1.25* 1.2* 1.1  CALCIUM 9.1 8.8 8.8   Recent Labs    07/31/22 1448  AST 20  ALT 19  ALKPHOS 78  BILITOT 1.2  PROT 6.1*  ALBUMIN 3.6   Recent Labs    07/27/22 0000 07/31/22 1448  WBC 8.1 7.0  HGB 12.3 13.3  HCT 37 41.6  MCV  --  95.2  PLT 194 179   Lab Results  Component Value Date   TSH 1.03 08/13/2022   Lab Results  Component Value Date   HGBA1C 6.6 08/13/2022   Lab Results  Component Value Date   CHOL 192 03/19/2021   HDL 69 03/19/2021   LDLCALC 107 (H) 03/19/2021   LDLDIRECT 114.7 08/18/2011   TRIG 78 03/19/2021   CHOLHDL 2.8 03/19/2021    Significant Diagnostic Results in last 30 days:  No results found.  Assessment/Plan Congestive heart failure (CHF) (HCC) clinically presumed. 08/16/22 Echo mild aortic stenosis, normal left ventricular systolic function, no EF.  BNP in 364 08/12/22>>248 09/21/22,  already on Furosemide/Kcl, extra dose of Furosemide 40mg  8/5 and 8/6, ? no weight change, cough, rales posterior mid to lower lungs, O2 desaturation, tachypnea Will adding extra dose  Furosemide 40mg /Kcl at 2pm x 3 days  COPD (chronic obstructive pulmonary disease) with chronic bronchitis  intermittent chronic mild expiratory wheezes, c/o SOB, uses O2 more than prior,  takes Symbicort, Zyrtec, Mucinex, DuoNeb, prn Albuterol, cough is chronic, on Prednisone low dose. treated with higher dose of Prednisone for symptomatic control. completed 7 day course of Doxy today.   Physical deconditioning Supportive care, Hospice referral, prn Lorazepam for comfort care.   CVA (cerebral vascular accident) [facility]) CT x2 03/2021 showed no acute findings except age indeterminate right corona radiator infarct, small vessel changes. Neurology recommended no further work ups.  Osteoarthritis  Chronic lower back, leg pain,  R shoulder pain, had Ortho eval, takes Lyrica, Methocarbamol.   Atrophic vaginitis Urinary symptoms, on  Nitrofurantoin 50mg  qd for UTI suppression, Dr. Vernie Ammons, hx of Estrace vaginal cream for atrophic vaginitis.   ABLA (acute blood loss anemia)  Hx of Hgb 6.8 in hospital s/p 2 units of PRBC,  Vit B12, Iron, Hgb 13.3 07/31/22  CKD (chronic kidney disease) stage 3, GFR 30-59 ml/min (HCC) Bun/creat 22/1.00 09/21/22  GERD  GERD/chronic gastritis/atrophic gastritis, GI bleed 02/2020, s/p EGD, per biopsy, takes Protonix. F/u GI prn  Slow transit constipation Stable, takes MiraLax 2x/wk. Colace daily.      Family/ staff Communication: plan of care reviewed with the patient and charge nurse.   Labs/tests ordered:  none  Time spend 35 minutes.

## 2022-09-24 NOTE — Assessment & Plan Note (Signed)
Supportive care, Hospice referral, prn Lorazepam for comfort care.

## 2022-09-24 NOTE — Assessment & Plan Note (Signed)
Hx of Hgb 6.8 in hospital s/p 2 units of PRBC,  Vit B12, Iron, Hgb 13.3 07/31/22

## 2022-09-24 NOTE — Assessment & Plan Note (Addendum)
Urinary symptoms, on  Nitrofurantoin 50mg  qd for UTI suppression, Dr. Vernie Ammons, hx of Estrace vaginal cream for atrophic vaginitis.

## 2022-09-26 DIAGNOSIS — M6281 Muscle weakness (generalized): Secondary | ICD-10-CM | POA: Diagnosis not present

## 2022-09-26 DIAGNOSIS — R2689 Other abnormalities of gait and mobility: Secondary | ICD-10-CM | POA: Diagnosis not present

## 2022-09-26 DIAGNOSIS — G629 Polyneuropathy, unspecified: Secondary | ICD-10-CM | POA: Diagnosis not present

## 2022-09-26 DIAGNOSIS — F0390 Unspecified dementia without behavioral disturbance: Secondary | ICD-10-CM | POA: Diagnosis not present

## 2022-09-26 DIAGNOSIS — N1831 Chronic kidney disease, stage 3a: Secondary | ICD-10-CM | POA: Diagnosis not present

## 2022-09-26 DIAGNOSIS — H3530 Unspecified macular degeneration: Secondary | ICD-10-CM | POA: Diagnosis not present

## 2022-09-26 DIAGNOSIS — J449 Chronic obstructive pulmonary disease, unspecified: Secondary | ICD-10-CM | POA: Diagnosis not present

## 2022-09-26 DIAGNOSIS — I130 Hypertensive heart and chronic kidney disease with heart failure and stage 1 through stage 4 chronic kidney disease, or unspecified chronic kidney disease: Secondary | ICD-10-CM | POA: Diagnosis not present

## 2022-09-26 DIAGNOSIS — I69318 Other symptoms and signs involving cognitive functions following cerebral infarction: Secondary | ICD-10-CM | POA: Diagnosis not present

## 2022-09-26 DIAGNOSIS — K219 Gastro-esophageal reflux disease without esophagitis: Secondary | ICD-10-CM | POA: Diagnosis not present

## 2022-09-26 DIAGNOSIS — M5459 Other low back pain: Secondary | ICD-10-CM | POA: Diagnosis not present

## 2022-09-26 DIAGNOSIS — I509 Heart failure, unspecified: Secondary | ICD-10-CM | POA: Diagnosis not present

## 2022-09-27 ENCOUNTER — Encounter: Payer: Self-pay | Admitting: Family Medicine

## 2022-09-27 DIAGNOSIS — I509 Heart failure, unspecified: Secondary | ICD-10-CM | POA: Diagnosis not present

## 2022-09-27 DIAGNOSIS — N1831 Chronic kidney disease, stage 3a: Secondary | ICD-10-CM | POA: Diagnosis not present

## 2022-09-27 DIAGNOSIS — F0390 Unspecified dementia without behavioral disturbance: Secondary | ICD-10-CM | POA: Diagnosis not present

## 2022-09-27 DIAGNOSIS — I69318 Other symptoms and signs involving cognitive functions following cerebral infarction: Secondary | ICD-10-CM | POA: Diagnosis not present

## 2022-09-27 DIAGNOSIS — J449 Chronic obstructive pulmonary disease, unspecified: Secondary | ICD-10-CM | POA: Diagnosis not present

## 2022-09-27 DIAGNOSIS — I130 Hypertensive heart and chronic kidney disease with heart failure and stage 1 through stage 4 chronic kidney disease, or unspecified chronic kidney disease: Secondary | ICD-10-CM | POA: Diagnosis not present

## 2022-09-28 DIAGNOSIS — J449 Chronic obstructive pulmonary disease, unspecified: Secondary | ICD-10-CM | POA: Diagnosis not present

## 2022-09-28 DIAGNOSIS — I69318 Other symptoms and signs involving cognitive functions following cerebral infarction: Secondary | ICD-10-CM | POA: Diagnosis not present

## 2022-09-28 DIAGNOSIS — I509 Heart failure, unspecified: Secondary | ICD-10-CM | POA: Diagnosis not present

## 2022-09-28 DIAGNOSIS — F0390 Unspecified dementia without behavioral disturbance: Secondary | ICD-10-CM | POA: Diagnosis not present

## 2022-09-28 DIAGNOSIS — N1831 Chronic kidney disease, stage 3a: Secondary | ICD-10-CM | POA: Diagnosis not present

## 2022-09-28 DIAGNOSIS — I130 Hypertensive heart and chronic kidney disease with heart failure and stage 1 through stage 4 chronic kidney disease, or unspecified chronic kidney disease: Secondary | ICD-10-CM | POA: Diagnosis not present

## 2022-09-30 DIAGNOSIS — I69318 Other symptoms and signs involving cognitive functions following cerebral infarction: Secondary | ICD-10-CM | POA: Diagnosis not present

## 2022-09-30 DIAGNOSIS — I509 Heart failure, unspecified: Secondary | ICD-10-CM | POA: Diagnosis not present

## 2022-09-30 DIAGNOSIS — F0390 Unspecified dementia without behavioral disturbance: Secondary | ICD-10-CM | POA: Diagnosis not present

## 2022-09-30 DIAGNOSIS — N1831 Chronic kidney disease, stage 3a: Secondary | ICD-10-CM | POA: Diagnosis not present

## 2022-09-30 DIAGNOSIS — I130 Hypertensive heart and chronic kidney disease with heart failure and stage 1 through stage 4 chronic kidney disease, or unspecified chronic kidney disease: Secondary | ICD-10-CM | POA: Diagnosis not present

## 2022-09-30 DIAGNOSIS — J449 Chronic obstructive pulmonary disease, unspecified: Secondary | ICD-10-CM | POA: Diagnosis not present

## 2022-09-30 NOTE — Progress Notes (Deleted)
Lisa King, female    DOB: 1931/08/23   MRN: [id number]   Brief patient profile:  65 yowf   *** referred to pulmonary clinic 10/01/2022 by *** for ***        History of Present Illness  10/01/2022  Pulmonary/ 1st office eval/[name]  No chief complaint on file.    Dyspnea:  *** Cough: *** Sleep: *** SABA use:     Outpatient Medications Prior to Visit  Medication Sig Dispense Refill   acetaminophen (TYLENOL) 325 MG tablet Take 650 mg by mouth every 4 (four) hours as needed for fever.     albuterol (VENTOLIN HFA) 108 (90 Base) MCG/ACT inhaler Inhale into the lungs every 6 (six) hours as needed for wheezing or shortness of breath.     benzonatate (TESSALON) 100 MG capsule Take 100 mg by mouth 2 (two) times daily.     benzonatate (TESSALON) 100 MG capsule Take 100 mg by mouth 3 (three) times daily.     budesonide-formoterol (SYMBICORT) 80-4.5 MCG/ACT inhaler Inhale 1 puff into the lungs daily.     calcium carbonate (TUMS EX) 750 MG chewable tablet Chew 750 mg by mouth daily.      cholecalciferol (VITAMIN D) 1000 UNITS tablet Take 1,000 Units by mouth daily.     cyclobenzaprine (FLEXERIL) 5 MG tablet Take 2.5 mg by mouth as needed.     diclofenac Sodium (VOLTAREN) 1 % GEL Apply 2 g topically every 8 (eight) hours as needed.     docusate sodium (COLACE) 100 MG capsule Take 100 mg by mouth 2 (two) times daily.     DOXYCYCLINE HYCLATE PO Take 100 mg by mouth in the morning and at bedtime.     escitalopram (LEXAPRO) 5 MG tablet Take 5 mg by mouth daily.     ferrous sulfate 325 (65 FE) MG tablet Take 325 mg by mouth. Once A Day on Mon, Wed, Fri     furosemide (LASIX) 40 MG tablet Take 40 mg by mouth daily.     furosemide (LASIX) 40 MG tablet Take 1 tablet (40 mg total) by mouth daily for 2 days.     HYDROcodone-acetaminophen (NORCO/VICODIN) 5-325 MG tablet Take 1 tablet by mouth every 6 (six) hours as needed for moderate pain.     hydrocortisone cream 1 % Apply 1 application topically 2  (two) times daily as needed (wrists).     hydrocortisone valerate cream (WESTCORT) 0.2 % Apply 1 application topically 2 (two) times daily as needed (for itching). Cleanse behind ears with gentle cleanser and dry completely.     ipratropium-albuterol (DUONEB) 0.5-2.5 (3) MG/3ML SOLN Take 3 mLs by nebulization daily.     IPRATROPIUM-ALBUTEROL IN Inhale 1 vial into the lungs daily.     ketoconazole (NIZORAL) 2 % cream Apply 1 application  topically 2 (two) times daily as needed for irritation.     methocarbamol (ROBAXIN) 500 MG tablet Take 500 mg by mouth every 6 (six) hours as needed for muscle spasms.     nitrofurantoin (MACRODANTIN) 50 MG capsule Take 50 mg by mouth at bedtime.     Olopatadine HCl 0.2 % SOLN Place 1 drop into both eyes daily.     polyethylene glycol (MIRALAX / GLYCOLAX) 17 g packet Take 17 g by mouth 2 (two) times a week. Sunday and Wednesday     potassium chloride (KLOR-CON) 20 MEQ packet Take 20 mEq by mouth 2 (two) times daily.     pramoxine (SARNA SENSITIVE) 1 % LOTN  Apply to cheeks topically one time a day for rosy burning cheeks     predniSONE (DELTASONE) 5 MG tablet Take 5 mg by mouth daily with breakfast.     pregabalin (LYRICA) 75 MG capsule Take 1 capsule (75 mg total) by mouth at bedtime. 30 capsule 0   Propylene Glycol (SYSTANE COMPLETE) 0.6 % SOLN Instill 1 drop in both eyes four times a day for DRY EYE     vitamin B-12 (CYANOCOBALAMIN) 1000 MCG tablet Take 1,000 mcg by mouth daily.     zolpidem (AMBIEN) 5 MG tablet Take 5 mg by mouth at bedtime.     No facility-administered medications prior to visit.    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     Fibromyalgia    GERD 12/19/2006   Qualifier: Diagnosis of  By: Renaldo Fiddler CMA, Marliss Czar  04/13/16 TSH 1.20, Na 141, K 4.2, Bun 15, creat 0.79, wbc 5.5, Hgb 11.5, plt 283    GERD (gastroesophageal reflux disease)    Hammer toe of second toe of left foot 04/08/2016   Left 2nd   Hearing loss    Hemorrhoids    Hypercholesterolemia    Hypertension    Osteoarthritis 12/19/2006   Qualifier: Diagnosis of  By: Renaldo Fiddler CMA, Leigh     Osteoporosis       Objective:     There were no vitals taken for this visit.         Assessment   No problem-specific Assessment & Plan notes found for this encounter.     Sandrea Hughs, MD 09/30/2022

## 2022-10-01 ENCOUNTER — Institutional Professional Consult (permissible substitution): Payer: Medicare Other | Admitting: Internal Medicine

## 2022-10-01 DIAGNOSIS — I509 Heart failure, unspecified: Secondary | ICD-10-CM | POA: Diagnosis not present

## 2022-10-01 DIAGNOSIS — N1831 Chronic kidney disease, stage 3a: Secondary | ICD-10-CM | POA: Diagnosis not present

## 2022-10-01 DIAGNOSIS — F0390 Unspecified dementia without behavioral disturbance: Secondary | ICD-10-CM | POA: Diagnosis not present

## 2022-10-01 DIAGNOSIS — I69318 Other symptoms and signs involving cognitive functions following cerebral infarction: Secondary | ICD-10-CM | POA: Diagnosis not present

## 2022-10-01 DIAGNOSIS — I130 Hypertensive heart and chronic kidney disease with heart failure and stage 1 through stage 4 chronic kidney disease, or unspecified chronic kidney disease: Secondary | ICD-10-CM | POA: Diagnosis not present

## 2022-10-01 DIAGNOSIS — J449 Chronic obstructive pulmonary disease, unspecified: Secondary | ICD-10-CM | POA: Diagnosis not present

## 2022-10-04 DIAGNOSIS — I130 Hypertensive heart and chronic kidney disease with heart failure and stage 1 through stage 4 chronic kidney disease, or unspecified chronic kidney disease: Secondary | ICD-10-CM | POA: Diagnosis not present

## 2022-10-04 DIAGNOSIS — N1831 Chronic kidney disease, stage 3a: Secondary | ICD-10-CM | POA: Diagnosis not present

## 2022-10-04 DIAGNOSIS — I509 Heart failure, unspecified: Secondary | ICD-10-CM | POA: Diagnosis not present

## 2022-10-04 DIAGNOSIS — J449 Chronic obstructive pulmonary disease, unspecified: Secondary | ICD-10-CM | POA: Diagnosis not present

## 2022-10-04 DIAGNOSIS — I69318 Other symptoms and signs involving cognitive functions following cerebral infarction: Secondary | ICD-10-CM | POA: Diagnosis not present

## 2022-10-04 DIAGNOSIS — F0390 Unspecified dementia without behavioral disturbance: Secondary | ICD-10-CM | POA: Diagnosis not present

## 2022-10-05 DIAGNOSIS — I509 Heart failure, unspecified: Secondary | ICD-10-CM | POA: Diagnosis not present

## 2022-10-05 DIAGNOSIS — J449 Chronic obstructive pulmonary disease, unspecified: Secondary | ICD-10-CM | POA: Diagnosis not present

## 2022-10-05 DIAGNOSIS — I69318 Other symptoms and signs involving cognitive functions following cerebral infarction: Secondary | ICD-10-CM | POA: Diagnosis not present

## 2022-10-05 DIAGNOSIS — F0390 Unspecified dementia without behavioral disturbance: Secondary | ICD-10-CM | POA: Diagnosis not present

## 2022-10-05 DIAGNOSIS — I130 Hypertensive heart and chronic kidney disease with heart failure and stage 1 through stage 4 chronic kidney disease, or unspecified chronic kidney disease: Secondary | ICD-10-CM | POA: Diagnosis not present

## 2022-10-05 DIAGNOSIS — N1831 Chronic kidney disease, stage 3a: Secondary | ICD-10-CM | POA: Diagnosis not present

## 2022-10-08 DIAGNOSIS — I509 Heart failure, unspecified: Secondary | ICD-10-CM | POA: Diagnosis not present

## 2022-10-08 DIAGNOSIS — N1831 Chronic kidney disease, stage 3a: Secondary | ICD-10-CM | POA: Diagnosis not present

## 2022-10-08 DIAGNOSIS — F0390 Unspecified dementia without behavioral disturbance: Secondary | ICD-10-CM | POA: Diagnosis not present

## 2022-10-08 DIAGNOSIS — I69318 Other symptoms and signs involving cognitive functions following cerebral infarction: Secondary | ICD-10-CM | POA: Diagnosis not present

## 2022-10-08 DIAGNOSIS — I130 Hypertensive heart and chronic kidney disease with heart failure and stage 1 through stage 4 chronic kidney disease, or unspecified chronic kidney disease: Secondary | ICD-10-CM | POA: Diagnosis not present

## 2022-10-08 DIAGNOSIS — J449 Chronic obstructive pulmonary disease, unspecified: Secondary | ICD-10-CM | POA: Diagnosis not present

## 2022-10-12 DIAGNOSIS — I69318 Other symptoms and signs involving cognitive functions following cerebral infarction: Secondary | ICD-10-CM | POA: Diagnosis not present

## 2022-10-12 DIAGNOSIS — J449 Chronic obstructive pulmonary disease, unspecified: Secondary | ICD-10-CM | POA: Diagnosis not present

## 2022-10-12 DIAGNOSIS — N1831 Chronic kidney disease, stage 3a: Secondary | ICD-10-CM | POA: Diagnosis not present

## 2022-10-12 DIAGNOSIS — I509 Heart failure, unspecified: Secondary | ICD-10-CM | POA: Diagnosis not present

## 2022-10-12 DIAGNOSIS — F0390 Unspecified dementia without behavioral disturbance: Secondary | ICD-10-CM | POA: Diagnosis not present

## 2022-10-12 DIAGNOSIS — I130 Hypertensive heart and chronic kidney disease with heart failure and stage 1 through stage 4 chronic kidney disease, or unspecified chronic kidney disease: Secondary | ICD-10-CM | POA: Diagnosis not present

## 2022-10-13 DIAGNOSIS — F0390 Unspecified dementia without behavioral disturbance: Secondary | ICD-10-CM | POA: Diagnosis not present

## 2022-10-13 DIAGNOSIS — J449 Chronic obstructive pulmonary disease, unspecified: Secondary | ICD-10-CM | POA: Diagnosis not present

## 2022-10-13 DIAGNOSIS — I69318 Other symptoms and signs involving cognitive functions following cerebral infarction: Secondary | ICD-10-CM | POA: Diagnosis not present

## 2022-10-13 DIAGNOSIS — I130 Hypertensive heart and chronic kidney disease with heart failure and stage 1 through stage 4 chronic kidney disease, or unspecified chronic kidney disease: Secondary | ICD-10-CM | POA: Diagnosis not present

## 2022-10-13 DIAGNOSIS — N1831 Chronic kidney disease, stage 3a: Secondary | ICD-10-CM | POA: Diagnosis not present

## 2022-10-13 DIAGNOSIS — I509 Heart failure, unspecified: Secondary | ICD-10-CM | POA: Diagnosis not present

## 2022-10-15 DIAGNOSIS — I69318 Other symptoms and signs involving cognitive functions following cerebral infarction: Secondary | ICD-10-CM | POA: Diagnosis not present

## 2022-10-15 DIAGNOSIS — I130 Hypertensive heart and chronic kidney disease with heart failure and stage 1 through stage 4 chronic kidney disease, or unspecified chronic kidney disease: Secondary | ICD-10-CM | POA: Diagnosis not present

## 2022-10-15 DIAGNOSIS — J449 Chronic obstructive pulmonary disease, unspecified: Secondary | ICD-10-CM | POA: Diagnosis not present

## 2022-10-15 DIAGNOSIS — I509 Heart failure, unspecified: Secondary | ICD-10-CM | POA: Diagnosis not present

## 2022-10-15 DIAGNOSIS — N1831 Chronic kidney disease, stage 3a: Secondary | ICD-10-CM | POA: Diagnosis not present

## 2022-10-15 DIAGNOSIS — F0390 Unspecified dementia without behavioral disturbance: Secondary | ICD-10-CM | POA: Diagnosis not present

## 2022-10-17 DIAGNOSIS — J42 Unspecified chronic bronchitis: Secondary | ICD-10-CM | POA: Diagnosis not present

## 2022-10-17 DIAGNOSIS — N1831 Chronic kidney disease, stage 3a: Secondary | ICD-10-CM | POA: Diagnosis not present

## 2022-10-17 DIAGNOSIS — F32A Depression, unspecified: Secondary | ICD-10-CM | POA: Diagnosis not present

## 2022-10-17 DIAGNOSIS — J309 Allergic rhinitis, unspecified: Secondary | ICD-10-CM | POA: Diagnosis not present

## 2022-10-17 DIAGNOSIS — I5020 Unspecified systolic (congestive) heart failure: Secondary | ICD-10-CM | POA: Diagnosis not present

## 2022-10-17 DIAGNOSIS — H900 Conductive hearing loss, bilateral: Secondary | ICD-10-CM | POA: Diagnosis not present

## 2022-10-17 DIAGNOSIS — J449 Chronic obstructive pulmonary disease, unspecified: Secondary | ICD-10-CM | POA: Diagnosis not present

## 2022-10-17 DIAGNOSIS — R12 Heartburn: Secondary | ICD-10-CM | POA: Diagnosis not present

## 2022-10-17 DIAGNOSIS — F02A4 Dementia in other diseases classified elsewhere, mild, with anxiety: Secondary | ICD-10-CM | POA: Diagnosis not present

## 2022-10-17 DIAGNOSIS — G4700 Insomnia, unspecified: Secondary | ICD-10-CM | POA: Diagnosis not present

## 2022-10-17 DIAGNOSIS — D649 Anemia, unspecified: Secondary | ICD-10-CM | POA: Diagnosis not present

## 2022-10-17 DIAGNOSIS — K219 Gastro-esophageal reflux disease without esophagitis: Secondary | ICD-10-CM | POA: Diagnosis not present

## 2022-10-18 DIAGNOSIS — J309 Allergic rhinitis, unspecified: Secondary | ICD-10-CM | POA: Diagnosis not present

## 2022-10-18 DIAGNOSIS — I5020 Unspecified systolic (congestive) heart failure: Secondary | ICD-10-CM | POA: Diagnosis not present

## 2022-10-18 DIAGNOSIS — N1831 Chronic kidney disease, stage 3a: Secondary | ICD-10-CM | POA: Diagnosis not present

## 2022-10-18 DIAGNOSIS — J449 Chronic obstructive pulmonary disease, unspecified: Secondary | ICD-10-CM | POA: Diagnosis not present

## 2022-10-18 DIAGNOSIS — F02A4 Dementia in other diseases classified elsewhere, mild, with anxiety: Secondary | ICD-10-CM | POA: Diagnosis not present

## 2022-10-18 DIAGNOSIS — H900 Conductive hearing loss, bilateral: Secondary | ICD-10-CM | POA: Diagnosis not present

## 2022-10-19 DIAGNOSIS — J449 Chronic obstructive pulmonary disease, unspecified: Secondary | ICD-10-CM | POA: Diagnosis not present

## 2022-10-19 DIAGNOSIS — H900 Conductive hearing loss, bilateral: Secondary | ICD-10-CM | POA: Diagnosis not present

## 2022-10-19 DIAGNOSIS — I5020 Unspecified systolic (congestive) heart failure: Secondary | ICD-10-CM | POA: Diagnosis not present

## 2022-10-19 DIAGNOSIS — F02A4 Dementia in other diseases classified elsewhere, mild, with anxiety: Secondary | ICD-10-CM | POA: Diagnosis not present

## 2022-10-19 DIAGNOSIS — J309 Allergic rhinitis, unspecified: Secondary | ICD-10-CM | POA: Diagnosis not present

## 2022-10-19 DIAGNOSIS — N1831 Chronic kidney disease, stage 3a: Secondary | ICD-10-CM | POA: Diagnosis not present

## 2022-10-20 DIAGNOSIS — H900 Conductive hearing loss, bilateral: Secondary | ICD-10-CM | POA: Diagnosis not present

## 2022-10-20 DIAGNOSIS — F02A4 Dementia in other diseases classified elsewhere, mild, with anxiety: Secondary | ICD-10-CM | POA: Diagnosis not present

## 2022-10-20 DIAGNOSIS — I5020 Unspecified systolic (congestive) heart failure: Secondary | ICD-10-CM | POA: Diagnosis not present

## 2022-10-20 DIAGNOSIS — N1831 Chronic kidney disease, stage 3a: Secondary | ICD-10-CM | POA: Diagnosis not present

## 2022-10-20 DIAGNOSIS — J449 Chronic obstructive pulmonary disease, unspecified: Secondary | ICD-10-CM | POA: Diagnosis not present

## 2022-10-20 DIAGNOSIS — J309 Allergic rhinitis, unspecified: Secondary | ICD-10-CM | POA: Diagnosis not present

## 2022-10-21 DIAGNOSIS — H900 Conductive hearing loss, bilateral: Secondary | ICD-10-CM | POA: Diagnosis not present

## 2022-10-21 DIAGNOSIS — J449 Chronic obstructive pulmonary disease, unspecified: Secondary | ICD-10-CM | POA: Diagnosis not present

## 2022-10-21 DIAGNOSIS — N1831 Chronic kidney disease, stage 3a: Secondary | ICD-10-CM | POA: Diagnosis not present

## 2022-10-21 DIAGNOSIS — I5020 Unspecified systolic (congestive) heart failure: Secondary | ICD-10-CM | POA: Diagnosis not present

## 2022-10-21 DIAGNOSIS — F02A4 Dementia in other diseases classified elsewhere, mild, with anxiety: Secondary | ICD-10-CM | POA: Diagnosis not present

## 2022-10-21 DIAGNOSIS — J309 Allergic rhinitis, unspecified: Secondary | ICD-10-CM | POA: Diagnosis not present

## 2022-10-22 DIAGNOSIS — N1831 Chronic kidney disease, stage 3a: Secondary | ICD-10-CM | POA: Diagnosis not present

## 2022-10-22 DIAGNOSIS — J309 Allergic rhinitis, unspecified: Secondary | ICD-10-CM | POA: Diagnosis not present

## 2022-10-22 DIAGNOSIS — I5020 Unspecified systolic (congestive) heart failure: Secondary | ICD-10-CM | POA: Diagnosis not present

## 2022-10-22 DIAGNOSIS — J449 Chronic obstructive pulmonary disease, unspecified: Secondary | ICD-10-CM | POA: Diagnosis not present

## 2022-10-22 DIAGNOSIS — H900 Conductive hearing loss, bilateral: Secondary | ICD-10-CM | POA: Diagnosis not present

## 2022-10-22 DIAGNOSIS — F02A4 Dementia in other diseases classified elsewhere, mild, with anxiety: Secondary | ICD-10-CM | POA: Diagnosis not present

## 2022-10-23 DIAGNOSIS — J449 Chronic obstructive pulmonary disease, unspecified: Secondary | ICD-10-CM | POA: Diagnosis not present

## 2022-10-23 DIAGNOSIS — N1831 Chronic kidney disease, stage 3a: Secondary | ICD-10-CM | POA: Diagnosis not present

## 2022-10-23 DIAGNOSIS — J309 Allergic rhinitis, unspecified: Secondary | ICD-10-CM | POA: Diagnosis not present

## 2022-10-23 DIAGNOSIS — H900 Conductive hearing loss, bilateral: Secondary | ICD-10-CM | POA: Diagnosis not present

## 2022-10-23 DIAGNOSIS — I5020 Unspecified systolic (congestive) heart failure: Secondary | ICD-10-CM | POA: Diagnosis not present

## 2022-10-23 DIAGNOSIS — F02A4 Dementia in other diseases classified elsewhere, mild, with anxiety: Secondary | ICD-10-CM | POA: Diagnosis not present

## 2022-10-24 DIAGNOSIS — J309 Allergic rhinitis, unspecified: Secondary | ICD-10-CM | POA: Diagnosis not present

## 2022-10-24 DIAGNOSIS — J449 Chronic obstructive pulmonary disease, unspecified: Secondary | ICD-10-CM | POA: Diagnosis not present

## 2022-10-24 DIAGNOSIS — N1831 Chronic kidney disease, stage 3a: Secondary | ICD-10-CM | POA: Diagnosis not present

## 2022-10-24 DIAGNOSIS — F02A4 Dementia in other diseases classified elsewhere, mild, with anxiety: Secondary | ICD-10-CM | POA: Diagnosis not present

## 2022-10-24 DIAGNOSIS — H900 Conductive hearing loss, bilateral: Secondary | ICD-10-CM | POA: Diagnosis not present

## 2022-10-24 DIAGNOSIS — I5020 Unspecified systolic (congestive) heart failure: Secondary | ICD-10-CM | POA: Diagnosis not present

## 2022-10-25 DIAGNOSIS — F02A4 Dementia in other diseases classified elsewhere, mild, with anxiety: Secondary | ICD-10-CM | POA: Diagnosis not present

## 2022-10-25 DIAGNOSIS — J309 Allergic rhinitis, unspecified: Secondary | ICD-10-CM | POA: Diagnosis not present

## 2022-10-25 DIAGNOSIS — N1831 Chronic kidney disease, stage 3a: Secondary | ICD-10-CM | POA: Diagnosis not present

## 2022-10-25 DIAGNOSIS — J449 Chronic obstructive pulmonary disease, unspecified: Secondary | ICD-10-CM | POA: Diagnosis not present

## 2022-10-25 DIAGNOSIS — H900 Conductive hearing loss, bilateral: Secondary | ICD-10-CM | POA: Diagnosis not present

## 2022-10-25 DIAGNOSIS — I5020 Unspecified systolic (congestive) heart failure: Secondary | ICD-10-CM | POA: Diagnosis not present

## 2022-10-26 DIAGNOSIS — N1831 Chronic kidney disease, stage 3a: Secondary | ICD-10-CM | POA: Diagnosis not present

## 2022-10-26 DIAGNOSIS — J309 Allergic rhinitis, unspecified: Secondary | ICD-10-CM | POA: Diagnosis not present

## 2022-10-26 DIAGNOSIS — F02A4 Dementia in other diseases classified elsewhere, mild, with anxiety: Secondary | ICD-10-CM | POA: Diagnosis not present

## 2022-10-26 DIAGNOSIS — J449 Chronic obstructive pulmonary disease, unspecified: Secondary | ICD-10-CM | POA: Diagnosis not present

## 2022-10-26 DIAGNOSIS — I5020 Unspecified systolic (congestive) heart failure: Secondary | ICD-10-CM | POA: Diagnosis not present

## 2022-10-26 DIAGNOSIS — H900 Conductive hearing loss, bilateral: Secondary | ICD-10-CM | POA: Diagnosis not present

## 2022-10-27 DIAGNOSIS — J449 Chronic obstructive pulmonary disease, unspecified: Secondary | ICD-10-CM | POA: Diagnosis not present

## 2022-10-27 DIAGNOSIS — F02A4 Dementia in other diseases classified elsewhere, mild, with anxiety: Secondary | ICD-10-CM | POA: Diagnosis not present

## 2022-10-27 DIAGNOSIS — N1831 Chronic kidney disease, stage 3a: Secondary | ICD-10-CM | POA: Diagnosis not present

## 2022-10-27 DIAGNOSIS — J309 Allergic rhinitis, unspecified: Secondary | ICD-10-CM | POA: Diagnosis not present

## 2022-10-27 DIAGNOSIS — H900 Conductive hearing loss, bilateral: Secondary | ICD-10-CM | POA: Diagnosis not present

## 2022-10-27 DIAGNOSIS — I5020 Unspecified systolic (congestive) heart failure: Secondary | ICD-10-CM | POA: Diagnosis not present

## 2022-10-28 DIAGNOSIS — F02A4 Dementia in other diseases classified elsewhere, mild, with anxiety: Secondary | ICD-10-CM | POA: Diagnosis not present

## 2022-10-28 DIAGNOSIS — I5020 Unspecified systolic (congestive) heart failure: Secondary | ICD-10-CM | POA: Diagnosis not present

## 2022-10-28 DIAGNOSIS — H900 Conductive hearing loss, bilateral: Secondary | ICD-10-CM | POA: Diagnosis not present

## 2022-10-28 DIAGNOSIS — N1831 Chronic kidney disease, stage 3a: Secondary | ICD-10-CM | POA: Diagnosis not present

## 2022-10-28 DIAGNOSIS — J309 Allergic rhinitis, unspecified: Secondary | ICD-10-CM | POA: Diagnosis not present

## 2022-10-28 DIAGNOSIS — J449 Chronic obstructive pulmonary disease, unspecified: Secondary | ICD-10-CM | POA: Diagnosis not present

## 2022-10-29 DIAGNOSIS — I5020 Unspecified systolic (congestive) heart failure: Secondary | ICD-10-CM | POA: Diagnosis not present

## 2022-10-29 DIAGNOSIS — J449 Chronic obstructive pulmonary disease, unspecified: Secondary | ICD-10-CM | POA: Diagnosis not present

## 2022-10-29 DIAGNOSIS — F02A4 Dementia in other diseases classified elsewhere, mild, with anxiety: Secondary | ICD-10-CM | POA: Diagnosis not present

## 2022-10-29 DIAGNOSIS — H900 Conductive hearing loss, bilateral: Secondary | ICD-10-CM | POA: Diagnosis not present

## 2022-10-29 DIAGNOSIS — N1831 Chronic kidney disease, stage 3a: Secondary | ICD-10-CM | POA: Diagnosis not present

## 2022-10-29 DIAGNOSIS — J309 Allergic rhinitis, unspecified: Secondary | ICD-10-CM | POA: Diagnosis not present

## 2022-10-30 DIAGNOSIS — N1831 Chronic kidney disease, stage 3a: Secondary | ICD-10-CM | POA: Diagnosis not present

## 2022-10-30 DIAGNOSIS — I5020 Unspecified systolic (congestive) heart failure: Secondary | ICD-10-CM | POA: Diagnosis not present

## 2022-10-30 DIAGNOSIS — J309 Allergic rhinitis, unspecified: Secondary | ICD-10-CM | POA: Diagnosis not present

## 2022-10-30 DIAGNOSIS — H900 Conductive hearing loss, bilateral: Secondary | ICD-10-CM | POA: Diagnosis not present

## 2022-10-30 DIAGNOSIS — F02A4 Dementia in other diseases classified elsewhere, mild, with anxiety: Secondary | ICD-10-CM | POA: Diagnosis not present

## 2022-10-30 DIAGNOSIS — J449 Chronic obstructive pulmonary disease, unspecified: Secondary | ICD-10-CM | POA: Diagnosis not present

## 2022-10-31 DIAGNOSIS — I5020 Unspecified systolic (congestive) heart failure: Secondary | ICD-10-CM | POA: Diagnosis not present

## 2022-10-31 DIAGNOSIS — F02A4 Dementia in other diseases classified elsewhere, mild, with anxiety: Secondary | ICD-10-CM | POA: Diagnosis not present

## 2022-10-31 DIAGNOSIS — N1831 Chronic kidney disease, stage 3a: Secondary | ICD-10-CM | POA: Diagnosis not present

## 2022-10-31 DIAGNOSIS — J449 Chronic obstructive pulmonary disease, unspecified: Secondary | ICD-10-CM | POA: Diagnosis not present

## 2022-10-31 DIAGNOSIS — H900 Conductive hearing loss, bilateral: Secondary | ICD-10-CM | POA: Diagnosis not present

## 2022-10-31 DIAGNOSIS — J309 Allergic rhinitis, unspecified: Secondary | ICD-10-CM | POA: Diagnosis not present

## 2022-11-01 ENCOUNTER — Non-Acute Institutional Stay (SKILLED_NURSING_FACILITY): Payer: Medicare Other | Admitting: Nurse Practitioner

## 2022-11-01 ENCOUNTER — Encounter: Payer: Self-pay | Admitting: Nurse Practitioner

## 2022-11-01 DIAGNOSIS — I63411 Cerebral infarction due to embolism of right middle cerebral artery: Secondary | ICD-10-CM

## 2022-11-01 DIAGNOSIS — M797 Fibromyalgia: Secondary | ICD-10-CM | POA: Diagnosis not present

## 2022-11-01 DIAGNOSIS — F5105 Insomnia due to other mental disorder: Secondary | ICD-10-CM

## 2022-11-01 DIAGNOSIS — K5901 Slow transit constipation: Secondary | ICD-10-CM

## 2022-11-01 DIAGNOSIS — J449 Chronic obstructive pulmonary disease, unspecified: Secondary | ICD-10-CM | POA: Diagnosis not present

## 2022-11-01 DIAGNOSIS — I10 Essential (primary) hypertension: Secondary | ICD-10-CM

## 2022-11-01 DIAGNOSIS — K219 Gastro-esophageal reflux disease without esophagitis: Secondary | ICD-10-CM | POA: Diagnosis not present

## 2022-11-01 DIAGNOSIS — I509 Heart failure, unspecified: Secondary | ICD-10-CM | POA: Diagnosis not present

## 2022-11-01 DIAGNOSIS — J4489 Other specified chronic obstructive pulmonary disease: Secondary | ICD-10-CM | POA: Diagnosis not present

## 2022-11-01 DIAGNOSIS — N952 Postmenopausal atrophic vaginitis: Secondary | ICD-10-CM

## 2022-11-01 DIAGNOSIS — F419 Anxiety disorder, unspecified: Secondary | ICD-10-CM | POA: Diagnosis not present

## 2022-11-01 DIAGNOSIS — F02A4 Dementia in other diseases classified elsewhere, mild, with anxiety: Secondary | ICD-10-CM | POA: Diagnosis not present

## 2022-11-01 DIAGNOSIS — D649 Anemia, unspecified: Secondary | ICD-10-CM

## 2022-11-01 DIAGNOSIS — J309 Allergic rhinitis, unspecified: Secondary | ICD-10-CM | POA: Diagnosis not present

## 2022-11-01 DIAGNOSIS — H900 Conductive hearing loss, bilateral: Secondary | ICD-10-CM | POA: Diagnosis not present

## 2022-11-01 DIAGNOSIS — M159 Polyosteoarthritis, unspecified: Secondary | ICD-10-CM

## 2022-11-01 DIAGNOSIS — N1831 Chronic kidney disease, stage 3a: Secondary | ICD-10-CM | POA: Diagnosis not present

## 2022-11-01 DIAGNOSIS — I5020 Unspecified systolic (congestive) heart failure: Secondary | ICD-10-CM | POA: Diagnosis not present

## 2022-11-01 NOTE — Assessment & Plan Note (Signed)
takes Norco, off Lyrica

## 2022-11-01 NOTE — Assessment & Plan Note (Signed)
CT x2 03/2021 showed no acute findings except age indeterminate right corona radiator infarct, small vessel changes. Neurology recommended no further work ups.

## 2022-11-01 NOTE — Assessment & Plan Note (Signed)
Runs low, only takes Furosemide.

## 2022-11-01 NOTE — Assessment & Plan Note (Signed)
Chronic urinary symptoms, on  Nitrofurantoin 50mg  qd for UTI suppression, Dr. Vernie Ammons. Hx of Estrace vaginal cream for atrophic vaginitis.

## 2022-11-01 NOTE — Assessment & Plan Note (Signed)
Hx of Hgb 6.8 in hospital s/p 2 units of PRBC, pharm dc Vit B12, on Iron, Hgb 13.3 07/31/22

## 2022-11-01 NOTE — Assessment & Plan Note (Signed)
intermittent chronic mild expiratory wheezes, c/o SOB, uses O2 more than prior,  takes Benzonatate, DuoNeb, prn Albuterol/Budsonide, cough is chronic, on Prednisone low dose. treated with higher dose of Prednisone for symptomatic control.

## 2022-11-01 NOTE — Assessment & Plan Note (Signed)
clinically presumed. 08/16/22 Echo mild aortic stenosis, normal left ventricular systolic function, no EF.  BNP in 364 08/12/22>>248 09/21/22,  already on Furosemide/Kcl, extra dose of Furosemide 40mg  8/5 and 8/6, ? no weight change, cough, rales posterior mid to lower lungs, O2 desaturation, tachypnea.  Will add Furosemide 20mg  q2pm, BMP one week

## 2022-11-01 NOTE — Assessment & Plan Note (Signed)
GERD/chronic gastritis/atrophic gastritis, GI bleed 02/2020, s/p EGD, per biopsy, takes Protonix. F/u GI prn

## 2022-11-01 NOTE — Assessment & Plan Note (Signed)
Chronic lower back, leg pain,  R shoulder pain, had Ortho eval, takes Lyrica, Tylenol, Norco

## 2022-11-01 NOTE — Assessment & Plan Note (Signed)
Bun/creat 22/1.00 09/21/22

## 2022-11-01 NOTE — Assessment & Plan Note (Signed)
Takes Ambien.

## 2022-11-01 NOTE — Assessment & Plan Note (Signed)
takes MiraLax,  Colace

## 2022-11-01 NOTE — Progress Notes (Unsigned)
Location:   SNF [facility] Room Number: 74 Place of Service:  SNF (31) Provider: Arna Snipe Neleh Muldoon NP  Athenia Rys X, NP  Patient Care Team: Randell Detter X, NP as PCP - General (Internal Medicine) Renatha Rosen X, NP as Nurse Practitioner (Internal Medicine)  Extended Emergency Contact Information Primary Emergency Contact: Lucero,Jeff Home Phone: [phone] Relation: Son Secondary Emergency Contact: Culbreth Sr.,Christopher Home Phone: [phone] Relation: Relative  Code Status:  DNR Goals of care: Advanced Directive information    09/24/2022    8:46 AM  Advanced Directives  Does Patient Have a Medical Advance Directive? Yes  Type of Advance Directive Living will;Out of facility DNR (pink MOST or yellow form)  Does patient want to make changes to medical advance directive? No - Patient declined     Chief Complaint  Patient presents with   Medical Management of Chronic Issues    HPI:  Pt is a 89 y.o. female seen today for medical management of chronic diseases.    CHF clinically presumed. 08/16/22 Echo mild aortic stenosis, normal left ventricular systolic function, no EF.  BNP in 364 08/12/22>>248 09/21/22,  already on Furosemide/Kcl, extra dose of Furosemide 40mg  8/5 and 8/6, ? no weight change, cough, rales posterior mid to lower lungs, O2 desaturation, tachypnea.              COPD, intermittent chronic mild expiratory wheezes, c/o SOB, uses O2 more than prior,  takes Benzonatate, DuoNeb, prn Albuterol/Budsonide, cough is chronic, on Prednisone low dose. treated with higher dose of Prednisone for symptomatic control.  Hx of CVA, CT x2 03/2021 showed no acute findings except age indeterminate right corona radiator infarct, small vessel changes. Neurology recommended no further work ups.              Macular degeneration, f/u [facility]: OU for [place] MD involving central area. OS exudative age related MD             Chronic lower back, leg pain,  R shoulder pain, had Ortho eval,  takes Lyrica, Tylenol, Norco.              HTN, off  Metoprolol.              Urinary symptoms, on  Nitrofurantoin 50mg  qd for UTI suppression, Dr. Vernie Ammons Hx of Estrace vaginal cream for atrophic vaginitis.              Anemia, Hx of Hgb 6.8 in hospital s/p 2 units of PRBC, pharm dc Vit B12, on Iron, Hgb 13.3 07/31/22             CKD Bun/creat 22/1.00 09/21/22             GERD/chronic gastritis/atrophic gastritis, GI bleed 02/2020, s/p EGD, per biopsy, takes Protonix. F/u GI prn Fibromyalgia takes Norco, off Lyrica             Tactile hallucinations, off meds. TSH 0.661 03/19/21             Constipation, takes MiraLax,  Colace               Prediabetes, Hgb a1c 6.6 08/12/22             Hypokalemia, K 3.9 09/21/22  Insomnia, takes Ambien.           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     Fibromyalgia    GERD 12/19/2006   Qualifier: Diagnosis of  By: Renaldo Fiddler CMA, Marliss Czar  04/13/16 TSH 1.20, Na 141, K 4.2, Bun 15, creat 0.79, wbc 5.5, Hgb 11.5, plt 283    GERD (gastroesophageal reflux disease)    Hammer toe of second toe of left foot 04/08/2016   Left 2nd   Hearing loss    Hemorrhoids    Hypercholesterolemia    Hypertension    Osteoarthritis 12/19/2006   Qualifier: Diagnosis of  By: Renaldo Fiddler CMA, Leigh     Osteoporosis    Past Surgical History:   Procedure Laterality Date   BIOPSY  02/17/2020   Procedure: BIOPSY;  Surgeon: Lynann Bologna, MD;  Location: [facility];  Service: Endoscopy;;   CATARACT EXTRACTION, BILATERAL  2009   COLONOSCOPY  2009, 2006 , 2017   ESOPHAGOGASTRODUODENOSCOPY (EGD) WITH PROPOFOL N/A 02/17/2020   Procedure: ESOPHAGOGASTRODUODENOSCOPY (EGD) WITH PROPOFOL;  Surgeon: Lynann Bologna, MD;  Location: [facility];  Service: Endoscopy;  Laterality: N/A;   IR ANGIOGRAM SELECTIVE EACH ADDITIONAL VESSEL  02/17/2020   IR ANGIOGRAM SELECTIVE EACH ADDITIONAL VESSEL  02/17/2020   IR ANGIOGRAM SELECTIVE EACH ADDITIONAL VESSEL  02/17/2020   IR ANGIOGRAM SELECTIVE EACH ADDITIONAL VESSEL  02/17/2020   IR ANGIOGRAM SELECTIVE EACH ADDITIONAL VESSEL  02/17/2020   IR ANGIOGRAM VISCERAL SELECTIVE  02/17/2020   IR ANGIOGRAM VISCERAL SELECTIVE  02/17/2020   IR US GUIDE VASC ACCESS RIGHT  02/17/2020   stapes surgery     Dr Dorma Russell   TONSILLECTOMY AND ADENOIDECTOMY     as a child    Allergies  Allergen Reactions   Penicillins Anaphylaxis   Bisphosphonates Other (See Comments)    [name]   Influenza Vaccines     Unknown   Simvastatin     Unknown    Trazodone And Nefazodone     Felt her throat was closing up/kept her awake   Sulfonamide Derivatives Rash and Other (See Comments)    blisters    Allergies as of 11/01/2022       Reactions   Penicillins Anaphylaxis   Bisphosphonates Other (See Comments)   [name]   Influenza Vaccines    Unknown   Simvastatin    Unknown    Trazodone And Nefazodone    Felt her throat was closing up/kept her awake   Sulfonamide Derivatives Rash, Other (See Comments)   blisters        Medication List        Accurate as of November 01, 2022 11:59 PM. If you have any questions, ask your nurse or doctor.          acetaminophen 325 MG tablet Commonly known as: TYLENOL Take 650 mg by mouth every 4 (four) hours as needed for fever.   albuterol 108 (90 Base) MCG/ACT  inhaler Commonly  known as: VENTOLIN HFA Inhale into the lungs every 6 (six) hours as needed for wheezing or shortness of breath.   Ambien 5 MG tablet Generic drug: zolpidem Take 5 mg by mouth at bedtime.   benzonatate 100 MG capsule Commonly known as: TESSALON Take 100 mg by mouth 2 (two) times daily.   benzonatate 100 MG capsule Commonly known as: TESSALON Take 100 mg by mouth 3 (three) times daily.   budesonide-formoterol 80-4.5 MCG/ACT inhaler Commonly known as: SYMBICORT Inhale 1 puff into the lungs daily.   calcium carbonate 750 MG chewable tablet Commonly known as: TUMS EX Chew 750 mg by mouth daily.   cholecalciferol 1000 units tablet Commonly known as: VITAMIN D Take 1,000 Units by mouth daily.   cyanocobalamin 1000 MCG tablet Commonly known as: VITAMIN B12 Take 1,000 mcg by mouth daily.   cyclobenzaprine 5 MG tablet Commonly known as: FLEXERIL Take 2.5 mg by mouth as needed.   docusate sodium 100 MG capsule Commonly known as: COLACE Take 100 mg by mouth 2 (two) times daily.   DOXYCYCLINE HYCLATE PO Take 100 mg by mouth in the morning and at bedtime.   escitalopram 5 MG tablet Commonly known as: LEXAPRO Take 5 mg by mouth daily.   ferrous sulfate 325 (65 FE) MG tablet Take 325 mg by mouth. Once A Day on Mon, Wed, Fri   furosemide 40 MG tablet Commonly known as: LASIX Take 40 mg by mouth daily.   furosemide 40 MG tablet Commonly known as: LASIX Take 1 tablet (40 mg total) by mouth daily for 2 days.   HYDROcodone-acetaminophen 5-325 MG tablet Commonly known as: NORCO/VICODIN Take 1 tablet by mouth every 6 (six) hours as needed for moderate pain.   hydrocortisone cream 1 % Apply 1 application topically 2 (two) times daily as needed (wrists).   hydrocortisone valerate cream 0.2 % Commonly known as: WESTCORT Apply 1 application topically 2 (two) times daily as needed (for itching). Cleanse behind ears with gentle cleanser and dry completely.    ipratropium-albuterol 0.5-2.5 (3) MG/3ML Soln Commonly known as: DUONEB Take 3 mLs by nebulization daily.   IPRATROPIUM-ALBUTEROL IN Inhale 1 vial into the lungs daily.   ketoconazole 2 % cream Commonly known as: NIZORAL Apply 1 application  topically 2 (two) times daily as needed for irritation.   methocarbamol 500 MG tablet Commonly known as: ROBAXIN Take 500 mg by mouth every 6 (six) hours as needed for muscle spasms.   nitrofurantoin 50 MG capsule Commonly known as: MACRODANTIN Take 50 mg by mouth at bedtime.   Olopatadine HCl 0.2 % Soln Place 1 drop into both eyes daily.   polyethylene glycol 17 g packet Commonly known as: MIRALAX / GLYCOLAX Take 17 g by mouth 2 (two) times a week. Sunday and Wednesday   potassium chloride 20 MEQ packet Commonly known as: KLOR-CON Take 20 mEq by mouth 2 (two) times daily.   predniSONE 5 MG tablet Commonly known as: DELTASONE Take 5 mg by mouth daily with breakfast.   pregabalin 75 MG capsule Commonly known as: LYRICA Take 1 capsule (75 mg total) by mouth at bedtime.   Sarna Sensitive 1 % Lotn Generic drug: pramoxine Apply to cheeks topically one time a day for rosy burning cheeks   Systane Complete 0.6 % Soln Generic drug: Propylene Glycol Instill 1 drop in both eyes four times a day for DRY EYE   Voltaren 1 % Gel Generic drug: diclofenac Sodium Apply 2 g topically every 8 (eight) hours  as needed.        Review of Systems  Constitutional:  Positive for fatigue. Negative for appetite change and fever.  HENT:  Positive for hearing loss. Negative for congestion and voice change.   Eyes:  Negative for visual disturbance.       C/o blurred vision, ? Color, ? Right eye peripheral vision loss-f/u Ophthalmology  Respiratory:  Positive for cough and shortness of breath. Negative for chest tightness and wheezing.        DOE, chronic hacking cough  Cardiovascular:  Positive for leg swelling. Negative for chest pain and  palpitations.  Gastrointestinal:  Negative for abdominal pain and constipation.  Genitourinary:  Negative for dysuria and urgency.       Chronic, on and off  Musculoskeletal:  Positive for arthralgias, back pain, gait problem and myalgias. Negative for joint swelling.       New mid back pain, mostly in the muscle next to thoracic spine palpated.   Skin:  Negative for color change.  Neurological:  Negative for speech difficulty, weakness and light-headedness.  Psychiatric/Behavioral:  Positive for sleep disturbance. Negative for behavioral problems and hallucinations.        Chronic going to bed late, sleeping in late.     Immunization History  Administered Date(s) Administered   Moderna SARS-COV2 Booster Vaccination 12/25/2019   Moderna Sars-Covid-2 Vaccination 02/17/2019, 03/17/2019   Pneumococcal Conjugate-13 09/01/2015   Pneumococcal Polysaccharide-23 07/07/1998   Td 08/21/2009   Pertinent  Health Maintenance Due  Topic Date Due   DEXA SCAN  Completed   INFLUENZA VACCINE  Discontinued      03/20/2021   12:00 AM 03/20/2021   12:00 PM 02/05/2022    9:16 AM 03/09/2022   10:01 AM 07/23/2022    3:44 PM  Fall Risk  Falls in the past year?   0 0 0  Was there an injury with Fall?   0 0 0  Fall Risk Category Calculator   0 0 0  Fall Risk Category (Retired)   Low    (RETIRED) Patient Fall Risk Level High fall risk High fall risk High fall risk    Patient at Risk for Falls Due to   History of fall(s) No Fall Risks No Fall Risks  Fall risk Follow up   Falls evaluation completed Falls evaluation completed Falls evaluation completed   Functional Status Survey:    Vitals:   11/01/22 1026  BP: 94/77  Pulse: (!) 58  Resp: 16  Temp: (!) 97.1 F (36.2 C)  SpO2: 94%  Weight: 164 lb 3.2 oz (74.5 kg)   Body mass index is 32.07 kg/m. Physical Exam Vitals and nursing note reviewed.  Constitutional:      Comments: Fatigue, sleepy  HENT:     Head: Normocephalic and atraumatic.      Nose: Nose normal.     Mouth/Throat:     Mouth: Mucous membranes are moist.  Eyes:     Extraocular Movements: Extraocular movements intact.     Conjunctiva/sclera: Conjunctivae normal.     Pupils: Pupils are equal, round, and reactive to light.     Comments: Able to count my fingers 3 feet away from her eyes.   Cardiovascular:     Rate and Rhythm: Normal rate and regular rhythm.     Heart sounds: No murmur heard. Pulmonary:     Effort: Pulmonary effort is normal.     Breath sounds: Rales present. No wheezing or rhonchi.     Comments: Decreased  air entry to both lungs. Needs O2 via [place] to maintain SatO2>90%. Moist rales bibasilar.  Chest:     Chest wall: No tenderness.  Abdominal:     General: Bowel sounds are normal.     Palpations: Abdomen is soft.     Tenderness: There is no abdominal tenderness.  Musculoskeletal:        General: Tenderness present.     Cervical back: Normal range of motion and neck supple.     Right lower leg: Edema present.     Left lower leg: Edema present.     Comments: mild edema BLE. R shoulder pain with overhead ROM. Better with mid back pain  Skin:    General: Skin is warm and dry.     Findings: No bruising.  Neurological:     General: No focal deficit present.     Mental Status: She is alert. Mental status is at baseline.     Motor: No weakness.     Coordination: Coordination normal.     Gait: Gait abnormal.     Comments: Oriented to person, place.   Psychiatric:        Mood and Affect: Mood normal.        Behavior: Behavior normal.     Labs reviewed: Recent Labs    07/31/22 1448 08/13/22 0000 08/24/22 0000  NA 137 142 141  K 3.6 3.1* 4.1  CL 100 101 103  CO2 27 29* 31*  GLUCOSE 112*  --   --   BUN 16 22* 18  CREATININE 1.25* 1.2* 1.1  CALCIUM 9.1 8.8 8.8   Recent Labs    07/31/22 1448  AST 20  ALT 19  ALKPHOS 78  BILITOT 1.2  PROT 6.1*  ALBUMIN 3.6   Recent Labs    07/27/22 0000 07/31/22 1448  WBC 8.1 7.0  HGB 12.3  13.3  HCT 37 41.6  MCV  --  95.2  PLT 194 179   Lab Results  Component Value Date   TSH 1.03 08/13/2022   Lab Results  Component Value Date   HGBA1C 6.6 08/13/2022   Lab Results  Component Value Date   CHOL 192 03/19/2021   HDL 69 03/19/2021   LDLCALC 107 (H) 03/19/2021   LDLDIRECT 114.7 08/18/2011   TRIG 78 03/19/2021   CHOLHDL 2.8 03/19/2021    Significant Diagnostic Results in last 30 days:  No results found.  Assessment/Plan  Congestive heart failure (CHF) (HCC)  clinically presumed. 08/16/22 Echo mild aortic stenosis, normal left ventricular systolic function, no EF.  BNP in 364 08/12/22>>248 09/21/22,  already on Furosemide/Kcl, extra dose of Furosemide 40mg  8/5 and 8/6, ? no weight change, cough, rales posterior mid to lower lungs, O2 desaturation, tachypnea.  Will add Furosemide 20mg  q2pm, BMP one week  COPD (chronic obstructive pulmonary disease) with chronic bronchitis  intermittent chronic mild expiratory wheezes, c/o SOB, uses O2 more than prior,  takes Benzonatate, DuoNeb, prn Albuterol/Budsonide, cough is chronic, on Prednisone low dose. treated with higher dose of Prednisone for symptomatic control.   CVA (cerebral vascular accident) [facility])  CT x2 03/2021 showed no acute findings except age indeterminate right corona radiator infarct, small vessel changes. Neurology recommended no further work ups.  HTN (hypertension) Runs low, only takes Furosemide.   Osteoarthritis  Chronic lower back, leg pain,  R shoulder pain, had Ortho eval, takes Lyrica, Tylenol, Norco  Atrophic vaginitis Chronic urinary symptoms, on  Nitrofurantoin 50mg  qd for UTI suppression, Dr. Vernie Ammons. Hx of  Estrace vaginal cream for atrophic vaginitis.   Chronic anemia Hx of Hgb 6.8 in hospital s/p 2 units of PRBC, pharm dc Vit B12, on Iron, Hgb 13.3 07/31/22  CKD (chronic kidney disease) stage 3, GFR 30-59 ml/min (HCC) Bun/creat 22/1.00 09/21/22  GERD GERD/chronic gastritis/atrophic gastritis,  GI bleed 02/2020, s/p EGD, per biopsy, takes Protonix. F/u GI prn  Fibromyalgia  takes Norco, off Lyrica  Slow transit constipation  takes MiraLax,  Colace    Insomnia secondary to anxiety Takes Ambien.    Family/ staff Communication: plan of care reviewed with the patient and charge nurse.   Labs/tests ordered:  BMP  Time spend 35 minutes.

## 2022-11-02 ENCOUNTER — Encounter: Payer: Self-pay | Admitting: Internal Medicine

## 2022-11-02 DIAGNOSIS — J309 Allergic rhinitis, unspecified: Secondary | ICD-10-CM | POA: Diagnosis not present

## 2022-11-02 DIAGNOSIS — J449 Chronic obstructive pulmonary disease, unspecified: Secondary | ICD-10-CM | POA: Diagnosis not present

## 2022-11-02 DIAGNOSIS — F02A4 Dementia in other diseases classified elsewhere, mild, with anxiety: Secondary | ICD-10-CM | POA: Diagnosis not present

## 2022-11-02 DIAGNOSIS — I5020 Unspecified systolic (congestive) heart failure: Secondary | ICD-10-CM | POA: Diagnosis not present

## 2022-11-02 DIAGNOSIS — H900 Conductive hearing loss, bilateral: Secondary | ICD-10-CM | POA: Diagnosis not present

## 2022-11-02 DIAGNOSIS — N1831 Chronic kidney disease, stage 3a: Secondary | ICD-10-CM | POA: Diagnosis not present

## 2022-11-03 DIAGNOSIS — J309 Allergic rhinitis, unspecified: Secondary | ICD-10-CM | POA: Diagnosis not present

## 2022-11-03 DIAGNOSIS — F02A4 Dementia in other diseases classified elsewhere, mild, with anxiety: Secondary | ICD-10-CM | POA: Diagnosis not present

## 2022-11-03 DIAGNOSIS — H900 Conductive hearing loss, bilateral: Secondary | ICD-10-CM | POA: Diagnosis not present

## 2022-11-03 DIAGNOSIS — I5020 Unspecified systolic (congestive) heart failure: Secondary | ICD-10-CM | POA: Diagnosis not present

## 2022-11-03 DIAGNOSIS — N1831 Chronic kidney disease, stage 3a: Secondary | ICD-10-CM | POA: Diagnosis not present

## 2022-11-03 DIAGNOSIS — J449 Chronic obstructive pulmonary disease, unspecified: Secondary | ICD-10-CM | POA: Diagnosis not present

## 2022-11-04 DIAGNOSIS — J309 Allergic rhinitis, unspecified: Secondary | ICD-10-CM | POA: Diagnosis not present

## 2022-11-04 DIAGNOSIS — H900 Conductive hearing loss, bilateral: Secondary | ICD-10-CM | POA: Diagnosis not present

## 2022-11-04 DIAGNOSIS — N1831 Chronic kidney disease, stage 3a: Secondary | ICD-10-CM | POA: Diagnosis not present

## 2022-11-04 DIAGNOSIS — J449 Chronic obstructive pulmonary disease, unspecified: Secondary | ICD-10-CM | POA: Diagnosis not present

## 2022-11-04 DIAGNOSIS — F02A4 Dementia in other diseases classified elsewhere, mild, with anxiety: Secondary | ICD-10-CM | POA: Diagnosis not present

## 2022-11-04 DIAGNOSIS — I5020 Unspecified systolic (congestive) heart failure: Secondary | ICD-10-CM | POA: Diagnosis not present

## 2022-11-05 DIAGNOSIS — J449 Chronic obstructive pulmonary disease, unspecified: Secondary | ICD-10-CM | POA: Diagnosis not present

## 2022-11-05 DIAGNOSIS — N1831 Chronic kidney disease, stage 3a: Secondary | ICD-10-CM | POA: Diagnosis not present

## 2022-11-05 DIAGNOSIS — I5020 Unspecified systolic (congestive) heart failure: Secondary | ICD-10-CM | POA: Diagnosis not present

## 2022-11-05 DIAGNOSIS — H900 Conductive hearing loss, bilateral: Secondary | ICD-10-CM | POA: Diagnosis not present

## 2022-11-05 DIAGNOSIS — F02A4 Dementia in other diseases classified elsewhere, mild, with anxiety: Secondary | ICD-10-CM | POA: Diagnosis not present

## 2022-11-05 DIAGNOSIS — J309 Allergic rhinitis, unspecified: Secondary | ICD-10-CM | POA: Diagnosis not present

## 2022-11-06 DIAGNOSIS — F02A4 Dementia in other diseases classified elsewhere, mild, with anxiety: Secondary | ICD-10-CM | POA: Diagnosis not present

## 2022-11-06 DIAGNOSIS — J449 Chronic obstructive pulmonary disease, unspecified: Secondary | ICD-10-CM | POA: Diagnosis not present

## 2022-11-06 DIAGNOSIS — N1831 Chronic kidney disease, stage 3a: Secondary | ICD-10-CM | POA: Diagnosis not present

## 2022-11-06 DIAGNOSIS — H900 Conductive hearing loss, bilateral: Secondary | ICD-10-CM | POA: Diagnosis not present

## 2022-11-06 DIAGNOSIS — I5020 Unspecified systolic (congestive) heart failure: Secondary | ICD-10-CM | POA: Diagnosis not present

## 2022-11-06 DIAGNOSIS — J309 Allergic rhinitis, unspecified: Secondary | ICD-10-CM | POA: Diagnosis not present

## 2022-11-07 DIAGNOSIS — J309 Allergic rhinitis, unspecified: Secondary | ICD-10-CM | POA: Diagnosis not present

## 2022-11-07 DIAGNOSIS — J449 Chronic obstructive pulmonary disease, unspecified: Secondary | ICD-10-CM | POA: Diagnosis not present

## 2022-11-07 DIAGNOSIS — N1831 Chronic kidney disease, stage 3a: Secondary | ICD-10-CM | POA: Diagnosis not present

## 2022-11-07 DIAGNOSIS — I5020 Unspecified systolic (congestive) heart failure: Secondary | ICD-10-CM | POA: Diagnosis not present

## 2022-11-07 DIAGNOSIS — F02A4 Dementia in other diseases classified elsewhere, mild, with anxiety: Secondary | ICD-10-CM | POA: Diagnosis not present

## 2022-11-07 DIAGNOSIS — H900 Conductive hearing loss, bilateral: Secondary | ICD-10-CM | POA: Diagnosis not present

## 2022-11-08 DIAGNOSIS — I5020 Unspecified systolic (congestive) heart failure: Secondary | ICD-10-CM | POA: Diagnosis not present

## 2022-11-08 DIAGNOSIS — H900 Conductive hearing loss, bilateral: Secondary | ICD-10-CM | POA: Diagnosis not present

## 2022-11-08 DIAGNOSIS — J309 Allergic rhinitis, unspecified: Secondary | ICD-10-CM | POA: Diagnosis not present

## 2022-11-08 DIAGNOSIS — F02A4 Dementia in other diseases classified elsewhere, mild, with anxiety: Secondary | ICD-10-CM | POA: Diagnosis not present

## 2022-11-08 DIAGNOSIS — N1831 Chronic kidney disease, stage 3a: Secondary | ICD-10-CM | POA: Diagnosis not present

## 2022-11-08 DIAGNOSIS — J449 Chronic obstructive pulmonary disease, unspecified: Secondary | ICD-10-CM | POA: Diagnosis not present

## 2022-11-09 DIAGNOSIS — I10 Essential (primary) hypertension: Secondary | ICD-10-CM | POA: Diagnosis not present

## 2022-11-09 DIAGNOSIS — F02A4 Dementia in other diseases classified elsewhere, mild, with anxiety: Secondary | ICD-10-CM | POA: Diagnosis not present

## 2022-11-09 DIAGNOSIS — N1831 Chronic kidney disease, stage 3a: Secondary | ICD-10-CM | POA: Diagnosis not present

## 2022-11-09 DIAGNOSIS — J449 Chronic obstructive pulmonary disease, unspecified: Secondary | ICD-10-CM | POA: Diagnosis not present

## 2022-11-09 DIAGNOSIS — J309 Allergic rhinitis, unspecified: Secondary | ICD-10-CM | POA: Diagnosis not present

## 2022-11-09 DIAGNOSIS — I5020 Unspecified systolic (congestive) heart failure: Secondary | ICD-10-CM | POA: Diagnosis not present

## 2022-11-09 DIAGNOSIS — H900 Conductive hearing loss, bilateral: Secondary | ICD-10-CM | POA: Diagnosis not present

## 2022-11-09 LAB — BASIC METABOLIC PANEL
BUN: 20 (ref 4–21)
CO2: 32 — AB (ref 13–22)
Chloride: 98 — AB (ref 99–108)
Creatinine: 1.1 (ref 0.5–1.1)
Glucose: 83
Potassium: 3.7 meq/L (ref 3.5–5.1)
Sodium: 139 (ref 137–147)

## 2022-11-09 LAB — COMPREHENSIVE METABOLIC PANEL
Calcium: 9 (ref 8.7–10.7)
eGFR: 50

## 2022-11-10 DIAGNOSIS — H900 Conductive hearing loss, bilateral: Secondary | ICD-10-CM | POA: Diagnosis not present

## 2022-11-10 DIAGNOSIS — I5020 Unspecified systolic (congestive) heart failure: Secondary | ICD-10-CM | POA: Diagnosis not present

## 2022-11-10 DIAGNOSIS — F02A4 Dementia in other diseases classified elsewhere, mild, with anxiety: Secondary | ICD-10-CM | POA: Diagnosis not present

## 2022-11-10 DIAGNOSIS — N1831 Chronic kidney disease, stage 3a: Secondary | ICD-10-CM | POA: Diagnosis not present

## 2022-11-10 DIAGNOSIS — J449 Chronic obstructive pulmonary disease, unspecified: Secondary | ICD-10-CM | POA: Diagnosis not present

## 2022-11-10 DIAGNOSIS — J309 Allergic rhinitis, unspecified: Secondary | ICD-10-CM | POA: Diagnosis not present

## 2022-11-11 DIAGNOSIS — H900 Conductive hearing loss, bilateral: Secondary | ICD-10-CM | POA: Diagnosis not present

## 2022-11-11 DIAGNOSIS — F02A4 Dementia in other diseases classified elsewhere, mild, with anxiety: Secondary | ICD-10-CM | POA: Diagnosis not present

## 2022-11-11 DIAGNOSIS — J309 Allergic rhinitis, unspecified: Secondary | ICD-10-CM | POA: Diagnosis not present

## 2022-11-11 DIAGNOSIS — I5020 Unspecified systolic (congestive) heart failure: Secondary | ICD-10-CM | POA: Diagnosis not present

## 2022-11-11 DIAGNOSIS — N1831 Chronic kidney disease, stage 3a: Secondary | ICD-10-CM | POA: Diagnosis not present

## 2022-11-11 DIAGNOSIS — J449 Chronic obstructive pulmonary disease, unspecified: Secondary | ICD-10-CM | POA: Diagnosis not present

## 2022-11-12 DIAGNOSIS — J309 Allergic rhinitis, unspecified: Secondary | ICD-10-CM | POA: Diagnosis not present

## 2022-11-12 DIAGNOSIS — I5020 Unspecified systolic (congestive) heart failure: Secondary | ICD-10-CM | POA: Diagnosis not present

## 2022-11-12 DIAGNOSIS — N1831 Chronic kidney disease, stage 3a: Secondary | ICD-10-CM | POA: Diagnosis not present

## 2022-11-12 DIAGNOSIS — J449 Chronic obstructive pulmonary disease, unspecified: Secondary | ICD-10-CM | POA: Diagnosis not present

## 2022-11-12 DIAGNOSIS — H900 Conductive hearing loss, bilateral: Secondary | ICD-10-CM | POA: Diagnosis not present

## 2022-11-12 DIAGNOSIS — F02A4 Dementia in other diseases classified elsewhere, mild, with anxiety: Secondary | ICD-10-CM | POA: Diagnosis not present

## 2022-11-13 DIAGNOSIS — J309 Allergic rhinitis, unspecified: Secondary | ICD-10-CM | POA: Diagnosis not present

## 2022-11-13 DIAGNOSIS — F02A4 Dementia in other diseases classified elsewhere, mild, with anxiety: Secondary | ICD-10-CM | POA: Diagnosis not present

## 2022-11-13 DIAGNOSIS — I5020 Unspecified systolic (congestive) heart failure: Secondary | ICD-10-CM | POA: Diagnosis not present

## 2022-11-13 DIAGNOSIS — J449 Chronic obstructive pulmonary disease, unspecified: Secondary | ICD-10-CM | POA: Diagnosis not present

## 2022-11-13 DIAGNOSIS — N1831 Chronic kidney disease, stage 3a: Secondary | ICD-10-CM | POA: Diagnosis not present

## 2022-11-13 DIAGNOSIS — H900 Conductive hearing loss, bilateral: Secondary | ICD-10-CM | POA: Diagnosis not present

## 2022-11-14 DIAGNOSIS — I5020 Unspecified systolic (congestive) heart failure: Secondary | ICD-10-CM | POA: Diagnosis not present

## 2022-11-14 DIAGNOSIS — J309 Allergic rhinitis, unspecified: Secondary | ICD-10-CM | POA: Diagnosis not present

## 2022-11-14 DIAGNOSIS — F02A4 Dementia in other diseases classified elsewhere, mild, with anxiety: Secondary | ICD-10-CM | POA: Diagnosis not present

## 2022-11-14 DIAGNOSIS — H900 Conductive hearing loss, bilateral: Secondary | ICD-10-CM | POA: Diagnosis not present

## 2022-11-14 DIAGNOSIS — J449 Chronic obstructive pulmonary disease, unspecified: Secondary | ICD-10-CM | POA: Diagnosis not present

## 2022-11-14 DIAGNOSIS — N1831 Chronic kidney disease, stage 3a: Secondary | ICD-10-CM | POA: Diagnosis not present

## 2022-11-15 DIAGNOSIS — H900 Conductive hearing loss, bilateral: Secondary | ICD-10-CM | POA: Diagnosis not present

## 2022-11-15 DIAGNOSIS — J309 Allergic rhinitis, unspecified: Secondary | ICD-10-CM | POA: Diagnosis not present

## 2022-11-15 DIAGNOSIS — J449 Chronic obstructive pulmonary disease, unspecified: Secondary | ICD-10-CM | POA: Diagnosis not present

## 2022-11-15 DIAGNOSIS — I5020 Unspecified systolic (congestive) heart failure: Secondary | ICD-10-CM | POA: Diagnosis not present

## 2022-11-15 DIAGNOSIS — N1831 Chronic kidney disease, stage 3a: Secondary | ICD-10-CM | POA: Diagnosis not present

## 2022-11-15 DIAGNOSIS — F02A4 Dementia in other diseases classified elsewhere, mild, with anxiety: Secondary | ICD-10-CM | POA: Diagnosis not present

## 2022-11-16 DIAGNOSIS — K219 Gastro-esophageal reflux disease without esophagitis: Secondary | ICD-10-CM | POA: Diagnosis not present

## 2022-11-16 DIAGNOSIS — J449 Chronic obstructive pulmonary disease, unspecified: Secondary | ICD-10-CM | POA: Diagnosis not present

## 2022-11-16 DIAGNOSIS — D649 Anemia, unspecified: Secondary | ICD-10-CM | POA: Diagnosis not present

## 2022-11-16 DIAGNOSIS — N1831 Chronic kidney disease, stage 3a: Secondary | ICD-10-CM | POA: Diagnosis not present

## 2022-11-16 DIAGNOSIS — H900 Conductive hearing loss, bilateral: Secondary | ICD-10-CM | POA: Diagnosis not present

## 2022-11-16 DIAGNOSIS — I5020 Unspecified systolic (congestive) heart failure: Secondary | ICD-10-CM | POA: Diagnosis not present

## 2022-11-16 DIAGNOSIS — F02A4 Dementia in other diseases classified elsewhere, mild, with anxiety: Secondary | ICD-10-CM | POA: Diagnosis not present

## 2022-11-16 DIAGNOSIS — G4700 Insomnia, unspecified: Secondary | ICD-10-CM | POA: Diagnosis not present

## 2022-11-16 DIAGNOSIS — J42 Unspecified chronic bronchitis: Secondary | ICD-10-CM | POA: Diagnosis not present

## 2022-11-16 DIAGNOSIS — J309 Allergic rhinitis, unspecified: Secondary | ICD-10-CM | POA: Diagnosis not present

## 2022-11-16 DIAGNOSIS — R12 Heartburn: Secondary | ICD-10-CM | POA: Diagnosis not present

## 2022-11-16 DIAGNOSIS — F32A Depression, unspecified: Secondary | ICD-10-CM | POA: Diagnosis not present

## 2022-11-17 DIAGNOSIS — J309 Allergic rhinitis, unspecified: Secondary | ICD-10-CM | POA: Diagnosis not present

## 2022-11-17 DIAGNOSIS — J449 Chronic obstructive pulmonary disease, unspecified: Secondary | ICD-10-CM | POA: Diagnosis not present

## 2022-11-17 DIAGNOSIS — H900 Conductive hearing loss, bilateral: Secondary | ICD-10-CM | POA: Diagnosis not present

## 2022-11-17 DIAGNOSIS — F02A4 Dementia in other diseases classified elsewhere, mild, with anxiety: Secondary | ICD-10-CM | POA: Diagnosis not present

## 2022-11-17 DIAGNOSIS — N1831 Chronic kidney disease, stage 3a: Secondary | ICD-10-CM | POA: Diagnosis not present

## 2022-11-17 DIAGNOSIS — I5020 Unspecified systolic (congestive) heart failure: Secondary | ICD-10-CM | POA: Diagnosis not present

## 2022-11-18 DIAGNOSIS — I5020 Unspecified systolic (congestive) heart failure: Secondary | ICD-10-CM | POA: Diagnosis not present

## 2022-11-18 DIAGNOSIS — N1831 Chronic kidney disease, stage 3a: Secondary | ICD-10-CM | POA: Diagnosis not present

## 2022-11-18 DIAGNOSIS — H900 Conductive hearing loss, bilateral: Secondary | ICD-10-CM | POA: Diagnosis not present

## 2022-11-18 DIAGNOSIS — J449 Chronic obstructive pulmonary disease, unspecified: Secondary | ICD-10-CM | POA: Diagnosis not present

## 2022-11-18 DIAGNOSIS — F02A4 Dementia in other diseases classified elsewhere, mild, with anxiety: Secondary | ICD-10-CM | POA: Diagnosis not present

## 2022-11-18 DIAGNOSIS — J309 Allergic rhinitis, unspecified: Secondary | ICD-10-CM | POA: Diagnosis not present

## 2022-11-19 DIAGNOSIS — N1831 Chronic kidney disease, stage 3a: Secondary | ICD-10-CM | POA: Diagnosis not present

## 2022-11-19 DIAGNOSIS — F02A4 Dementia in other diseases classified elsewhere, mild, with anxiety: Secondary | ICD-10-CM | POA: Diagnosis not present

## 2022-11-19 DIAGNOSIS — H900 Conductive hearing loss, bilateral: Secondary | ICD-10-CM | POA: Diagnosis not present

## 2022-11-19 DIAGNOSIS — I5020 Unspecified systolic (congestive) heart failure: Secondary | ICD-10-CM | POA: Diagnosis not present

## 2022-11-19 DIAGNOSIS — J449 Chronic obstructive pulmonary disease, unspecified: Secondary | ICD-10-CM | POA: Diagnosis not present

## 2022-11-19 DIAGNOSIS — J309 Allergic rhinitis, unspecified: Secondary | ICD-10-CM | POA: Diagnosis not present

## 2022-11-20 DIAGNOSIS — N1831 Chronic kidney disease, stage 3a: Secondary | ICD-10-CM | POA: Diagnosis not present

## 2022-11-20 DIAGNOSIS — H900 Conductive hearing loss, bilateral: Secondary | ICD-10-CM | POA: Diagnosis not present

## 2022-11-20 DIAGNOSIS — F02A4 Dementia in other diseases classified elsewhere, mild, with anxiety: Secondary | ICD-10-CM | POA: Diagnosis not present

## 2022-11-20 DIAGNOSIS — I5020 Unspecified systolic (congestive) heart failure: Secondary | ICD-10-CM | POA: Diagnosis not present

## 2022-11-20 DIAGNOSIS — J309 Allergic rhinitis, unspecified: Secondary | ICD-10-CM | POA: Diagnosis not present

## 2022-11-20 DIAGNOSIS — J449 Chronic obstructive pulmonary disease, unspecified: Secondary | ICD-10-CM | POA: Diagnosis not present

## 2022-11-21 DIAGNOSIS — F02A4 Dementia in other diseases classified elsewhere, mild, with anxiety: Secondary | ICD-10-CM | POA: Diagnosis not present

## 2022-11-21 DIAGNOSIS — N1831 Chronic kidney disease, stage 3a: Secondary | ICD-10-CM | POA: Diagnosis not present

## 2022-11-21 DIAGNOSIS — H900 Conductive hearing loss, bilateral: Secondary | ICD-10-CM | POA: Diagnosis not present

## 2022-11-21 DIAGNOSIS — J309 Allergic rhinitis, unspecified: Secondary | ICD-10-CM | POA: Diagnosis not present

## 2022-11-21 DIAGNOSIS — J449 Chronic obstructive pulmonary disease, unspecified: Secondary | ICD-10-CM | POA: Diagnosis not present

## 2022-11-21 DIAGNOSIS — I5020 Unspecified systolic (congestive) heart failure: Secondary | ICD-10-CM | POA: Diagnosis not present

## 2022-11-22 DIAGNOSIS — H900 Conductive hearing loss, bilateral: Secondary | ICD-10-CM | POA: Diagnosis not present

## 2022-11-22 DIAGNOSIS — F02A4 Dementia in other diseases classified elsewhere, mild, with anxiety: Secondary | ICD-10-CM | POA: Diagnosis not present

## 2022-11-22 DIAGNOSIS — J309 Allergic rhinitis, unspecified: Secondary | ICD-10-CM | POA: Diagnosis not present

## 2022-11-22 DIAGNOSIS — I5020 Unspecified systolic (congestive) heart failure: Secondary | ICD-10-CM | POA: Diagnosis not present

## 2022-11-22 DIAGNOSIS — N1831 Chronic kidney disease, stage 3a: Secondary | ICD-10-CM | POA: Diagnosis not present

## 2022-11-22 DIAGNOSIS — J449 Chronic obstructive pulmonary disease, unspecified: Secondary | ICD-10-CM | POA: Diagnosis not present

## 2022-11-23 ENCOUNTER — Other Ambulatory Visit: Payer: Self-pay | Admitting: Adult Health

## 2022-11-23 DIAGNOSIS — F02A4 Dementia in other diseases classified elsewhere, mild, with anxiety: Secondary | ICD-10-CM | POA: Diagnosis not present

## 2022-11-23 DIAGNOSIS — N1831 Chronic kidney disease, stage 3a: Secondary | ICD-10-CM | POA: Diagnosis not present

## 2022-11-23 DIAGNOSIS — H900 Conductive hearing loss, bilateral: Secondary | ICD-10-CM | POA: Diagnosis not present

## 2022-11-23 DIAGNOSIS — J449 Chronic obstructive pulmonary disease, unspecified: Secondary | ICD-10-CM | POA: Diagnosis not present

## 2022-11-23 DIAGNOSIS — I5020 Unspecified systolic (congestive) heart failure: Secondary | ICD-10-CM | POA: Diagnosis not present

## 2022-11-23 DIAGNOSIS — J309 Allergic rhinitis, unspecified: Secondary | ICD-10-CM | POA: Diagnosis not present

## 2022-11-23 MED ORDER — ZOLPIDEM TARTRATE 5 MG PO TABS: 5.0000 mg | ORAL_TABLET | Freq: Every day | ORAL | 0 refills | Status: DC

## 2022-11-24 DIAGNOSIS — N1831 Chronic kidney disease, stage 3a: Secondary | ICD-10-CM | POA: Diagnosis not present

## 2022-11-24 DIAGNOSIS — J309 Allergic rhinitis, unspecified: Secondary | ICD-10-CM | POA: Diagnosis not present

## 2022-11-24 DIAGNOSIS — H900 Conductive hearing loss, bilateral: Secondary | ICD-10-CM | POA: Diagnosis not present

## 2022-11-24 DIAGNOSIS — I5020 Unspecified systolic (congestive) heart failure: Secondary | ICD-10-CM | POA: Diagnosis not present

## 2022-11-24 DIAGNOSIS — J449 Chronic obstructive pulmonary disease, unspecified: Secondary | ICD-10-CM | POA: Diagnosis not present

## 2022-11-24 DIAGNOSIS — F02A4 Dementia in other diseases classified elsewhere, mild, with anxiety: Secondary | ICD-10-CM | POA: Diagnosis not present

## 2022-11-25 ENCOUNTER — Encounter: Payer: Self-pay | Admitting: Sports Medicine

## 2022-11-25 ENCOUNTER — Non-Acute Institutional Stay (SKILLED_NURSING_FACILITY): Payer: Medicare Other | Admitting: Sports Medicine

## 2022-11-25 DIAGNOSIS — R600 Localized edema: Secondary | ICD-10-CM | POA: Diagnosis not present

## 2022-11-25 DIAGNOSIS — R059 Cough, unspecified: Secondary | ICD-10-CM

## 2022-11-25 DIAGNOSIS — F02A4 Dementia in other diseases classified elsewhere, mild, with anxiety: Secondary | ICD-10-CM | POA: Diagnosis not present

## 2022-11-25 DIAGNOSIS — F321 Major depressive disorder, single episode, moderate: Secondary | ICD-10-CM

## 2022-11-25 DIAGNOSIS — J449 Chronic obstructive pulmonary disease, unspecified: Secondary | ICD-10-CM | POA: Diagnosis not present

## 2022-11-25 DIAGNOSIS — J309 Allergic rhinitis, unspecified: Secondary | ICD-10-CM | POA: Diagnosis not present

## 2022-11-25 DIAGNOSIS — F5101 Primary insomnia: Secondary | ICD-10-CM | POA: Diagnosis not present

## 2022-11-25 DIAGNOSIS — H900 Conductive hearing loss, bilateral: Secondary | ICD-10-CM | POA: Diagnosis not present

## 2022-11-25 DIAGNOSIS — N1831 Chronic kidney disease, stage 3a: Secondary | ICD-10-CM | POA: Diagnosis not present

## 2022-11-25 DIAGNOSIS — I5020 Unspecified systolic (congestive) heart failure: Secondary | ICD-10-CM | POA: Diagnosis not present

## 2022-11-25 NOTE — Progress Notes (Signed)
Location:  [facility] Room Number: 60-A Place of Service:  SNF (31) Provider:  Venita Sheffield MD  Mast, Man X, NP  Patient Care Team: Mast, Man X, NP as PCP - General (Internal Medicine) Mast, Man X, NP as Nurse Practitioner (Internal Medicine)  Extended Emergency Contact Information Primary Emergency Contact: Hallett,Jeff Home Phone: [phone] Relation: Son Secondary Emergency Contact: Culbreth Sr.,Christopher Home Phone: [phone] Relation: Relative  Code Status:  DNR Goals of care: Advanced Directive information    11/25/2022   10:33 AM  Advanced Directives  Does Patient Have a Medical Advance Directive? Yes  Type of Advance Directive Living will;Out of facility DNR (pink MOST or yellow form)  Does patient want to make changes to medical advance directive? No - Patient declined  Pre-existing out of facility DNR order (yellow form or pink MOST form) Yellow form placed in chart (order not valid for inpatient use)     Chief Complaint  Patient presents with   Routine    HPI:  Pt is a 89 y.o. female seen today for medical management of chronic diseases.   Pt seen and examined in her room She is sitting comfortably in her recliner chair. She is on supplemental o2 24x 7 . C/o intermittent dry cough. Denies post nasal drip. Has exertional SOB but no recent change. She has chronic lower extremity swelling in her left leg , no new change as per nursing staff.  Pt ambulates with a walker in her room and depends on wheelchair when needs to go to dining hall. She needs help with transferring from bed to chair.  No recent falls.  Reports good appetite  C/o feeling depressed, she likes to participate in some activities. She likes to make bracelets.  She is on lexapro 5 mg and ativan as needed, she took only 1 dose within the last 1 month. Seems to be pleasant and converse well. Tells me that her son lives in [place] and his family tries to  visit her over the weekend and she enjoys eating meals with her daughter in [employer]. Pt reports urinary incontinence and wears diapers. 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     Fibromyalgia    GERD 12/19/2006   Qualifier: Diagnosis of  By: Renaldo Fiddler CMA, Marliss Czar  04/13/16 TSH 1.20, Na 141, K 4.2, Bun 15, creat 0.79, wbc 5.5, Hgb 11.5, plt 283    GERD (gastroesophageal reflux disease)    Hammer toe of second toe of left foot 04/08/2016   Left 2nd   Hearing loss    Hemorrhoids    Hypercholesterolemia    Hypertension  Osteoarthritis 12/19/2006   Qualifier: Diagnosis of  By: Renaldo Fiddler CMA, Leigh     Osteoporosis    Past Surgical History:  Procedure Laterality Date   BIOPSY  02/17/2020   Procedure: BIOPSY;  Surgeon: Lynann Bologna, MD;  Location: [facility];  Service: Endoscopy;;   CATARACT EXTRACTION, BILATERAL  2009   COLONOSCOPY  2009, 2006 , 2017   ESOPHAGOGASTRODUODENOSCOPY (EGD) WITH PROPOFOL N/A 02/17/2020   Procedure: ESOPHAGOGASTRODUODENOSCOPY (EGD) WITH PROPOFOL;  Surgeon: Lynann Bologna, MD;  Location: [facility];  Service: Endoscopy;  Laterality: N/A;   IR ANGIOGRAM SELECTIVE EACH ADDITIONAL VESSEL  02/17/2020   IR ANGIOGRAM SELECTIVE EACH ADDITIONAL  VESSEL  02/17/2020   IR ANGIOGRAM SELECTIVE EACH ADDITIONAL VESSEL  02/17/2020   IR ANGIOGRAM SELECTIVE EACH ADDITIONAL VESSEL  02/17/2020   IR ANGIOGRAM SELECTIVE EACH ADDITIONAL VESSEL  02/17/2020   IR ANGIOGRAM VISCERAL SELECTIVE  02/17/2020   IR ANGIOGRAM VISCERAL SELECTIVE  02/17/2020   IR US GUIDE VASC ACCESS RIGHT  02/17/2020   stapes surgery     Dr Dorma Russell   TONSILLECTOMY AND ADENOIDECTOMY     as a child    Allergies  Allergen Reactions   Penicillins Anaphylaxis   Bisphosphonates Other (See Comments)    [name]   Influenza Vaccines     Unknown   Simvastatin     Unknown    Trazodone And Nefazodone     Felt her throat was closing up/kept her awake   Sulfonamide Derivatives Rash and Other (See Comments)    blisters    Outpatient Encounter Medications as of 11/25/2022  Medication Sig   acetaminophen (TYLENOL) 325 MG tablet Take 650 mg by mouth every 4 (four) hours as needed for fever.  Give 650 mg by mouth at bedtime   Albuterol-Budesonide 90-80 MCG/ACT AERO Inhale 2 puffs into the lungs every 6 (six) hours as needed (shortness of breath/ wheezing).   benzonatate (TESSALON) 100 MG capsule Take 100 mg by mouth 2 (two) times daily.   budesonide-formoterol (SYMBICORT) 80-4.5 MCG/ACT inhaler Inhale 1 puff into the lungs daily.   calcium carbonate (ANTACID) 750 MG chewable tablet Chew 1 tablet by mouth daily.   cholecalciferol (VITAMIN D) 1000 UNITS tablet Take 1,000 Units by mouth daily.   diclofenac Sodium (VOLTAREN) 1 % GEL Apply 2 g topically every 8 (eight) hours as needed.   docusate sodium (COLACE) 100 MG capsule Take 100 mg by mouth 2 (two) times daily.   escitalopram (LEXAPRO) 5 MG tablet Take 5 mg by mouth daily.   ferrous sulfate 325 (65 FE) MG tablet Take 325 mg by mouth. Once A Day on Mon, Wed, Fri   furosemide (LASIX) 20 MG tablet Take 20 mg by mouth daily.   HYDROcodone-acetaminophen (NORCO/VICODIN) 5-325 MG tablet Take 1 tablet by mouth every 6 (six) hours as needed  for moderate pain.   hydrocortisone cream 1 % Apply 1 application topically 2 (two) times daily as needed (wrists).   hydrocortisone valerate cream (WESTCORT) 0.2 % Apply 1 application topically 2 (two) times daily as needed (for itching). Cleanse behind ears with gentle cleanser and dry completely.   ipratropium-albuterol (DUONEB) 0.5-2.5 (3) MG/3ML SOLN Take 3 mLs by nebulization in the morning and at bedtime. 1 vial inhale orally every 24 hours as needed for Wheezing   ketoconazole (NIZORAL) 2 % cream Apply 1 application  topically 2 (two) times daily as needed for irritation.   LORazepam (ATIVAN) 0.5 MG tablet Take 0.5 mg by mouth every 4 (four) hours  as needed for anxiety.   nitrofurantoin (MACRODANTIN) 50 MG capsule Take 50 mg by mouth at bedtime.   Olopatadine HCl 0.2 % SOLN Place 1 drop into both eyes daily.   polyethylene glycol (MIRALAX / GLYCOLAX) 17 g packet Take 17 g by mouth 2 (two) times a week. Sunday and Wednesday   potassium chloride (KLOR-CON) 20 MEQ packet Take 20 mEq by mouth 2 (two) times daily.   pramoxine (SARNA SENSITIVE) 1 % LOTN Apply to cheeks topically one time a day for rosy burning cheeks   predniSONE (DELTASONE) 5 MG tablet Take 5 mg by mouth daily with breakfast.   pregabalin (LYRICA) 75 MG capsule Take 1 capsule (75 mg total) by mouth at bedtime.   Propylene Glycol (SYSTANE COMPLETE) 0.6 % SOLN Instill 1 drop in both eyes four times a day for DRY EYE   vitamin B-12 (CYANOCOBALAMIN) 1000 MCG tablet Take 1,000 mcg by mouth daily.   zolpidem (AMBIEN) 5 MG tablet Take 1 tablet (5 mg total) by mouth at bedtime.   albuterol (VENTOLIN HFA) 108 (90 Base) MCG/ACT inhaler Inhale into the lungs every 6 (six) hours as needed for wheezing or shortness of breath.   furosemide (LASIX) 40 MG tablet Take 1 tablet (40 mg total) by mouth daily for 2 days.   ipratropium-albuterol (DUONEB) 0.5-2.5 (3) MG/3ML SOLN Take 3 mLs by nebulization daily.   methocarbamol (ROBAXIN) 500 MG  tablet Take 500 mg by mouth every 6 (six) hours as needed for muscle spasms. (Patient not taking: Reported on 11/25/2022)   [DISCONTINUED] benzonatate (TESSALON) 100 MG capsule Take 100 mg by mouth 3 (three) times daily.   [DISCONTINUED] calcium carbonate (TUMS EX) 750 MG chewable tablet Chew 750 mg by mouth daily.    [DISCONTINUED] cyclobenzaprine (FLEXERIL) 5 MG tablet Take 2.5 mg by mouth as needed.   [DISCONTINUED] DOXYCYCLINE HYCLATE PO Take 100 mg by mouth in the morning and at bedtime.   [DISCONTINUED] furosemide (LASIX) 40 MG tablet Take 40 mg by mouth daily.   [DISCONTINUED] IPRATROPIUM-ALBUTEROL IN Inhale 1 vial into the lungs daily.   No facility-administered encounter medications on file as of 11/25/2022.    Review of Systems  Constitutional:  Negative for chills and fever.  Respiratory:  Positive for cough. Negative for shortness of breath and wheezing.   Cardiovascular:  Positive for leg swelling. Negative for chest pain and palpitations.  Gastrointestinal:  Negative for abdominal distention, abdominal pain, blood in stool, constipation, diarrhea, nausea and vomiting.  Genitourinary:  Negative for dysuria, frequency and urgency.  Neurological:  Negative for dizziness, weakness and numbness.  Psychiatric/Behavioral:         Depression +    Immunization History  Administered Date(s) Administered   Moderna SARS-COV2 Booster Vaccination 12/25/2019   Moderna Sars-Covid-2 Vaccination 02/17/2019, 03/17/2019   Pneumococcal Conjugate-13 09/01/2015   Pneumococcal Polysaccharide-23 07/07/1998   Td 08/21/2009   Pertinent  Health Maintenance Due  Topic Date Due   DEXA SCAN  Completed   INFLUENZA VACCINE  Discontinued      03/20/2021   12:00 AM 03/20/2021   12:00 PM 02/05/2022    9:16 AM 03/09/2022   10:01 AM 07/23/2022    3:44 PM  Fall Risk  Falls in the past year?   0 0 0  Was there an injury with Fall?   0 0 0  Fall Risk Category Calculator   0 0 0  Fall Risk Category  (Retired)   Low    (RETIRED) Patient Fall Risk  Level High fall risk High fall risk High fall risk    Patient at Risk for Falls Due to   History of fall(s) No Fall Risks No Fall Risks  Fall risk Follow up   Falls evaluation completed Falls evaluation completed Falls evaluation completed   Functional Status Survey:    Vitals:   11/25/22 1030  BP: (!) 105/57  Pulse: 72  Resp: 16  Temp: (!) 97.1 F (36.2 C)  SpO2: 95%  Weight: 159 lb 6.4 oz (72.3 kg)  Height: 5' (1.524 m)   Body mass index is 31.13 kg/m. Physical Exam Constitutional:      Appearance: Normal appearance.  HENT:     Head: Normocephalic and atraumatic.  Cardiovascular:     Rate and Rhythm: Normal rate and regular rhythm.     Heart sounds: No murmur heard. Pulmonary:     Effort: Pulmonary effort is normal. No respiratory distress.     Breath sounds: Normal breath sounds. No wheezing.     Comments: Coarse breath sounds  Abdominal:     General: Bowel sounds are normal. There is no distension.     Tenderness: There is no abdominal tenderness. There is no guarding or rebound.  Musculoskeletal:        General: Swelling (left lower extremity swelling, dorsal puffiness, no erythema, no weeping.) present. No tenderness.  Skin:    General: Skin is dry.  Neurological:     Mental Status: She is alert. Mental status is at baseline.     Sensory: No sensory deficit.     Motor: No weakness.     Labs reviewed: Recent Labs    07/31/22 1448 08/13/22 0000 08/24/22 0000  NA 137 142 141  K 3.6 3.1* 4.1  CL 100 101 103  CO2 27 29* 31*  GLUCOSE 112*  --   --   BUN 16 22* 18  CREATININE 1.25* 1.2* 1.1  CALCIUM 9.1 8.8 8.8   Recent Labs    07/31/22 1448  AST 20  ALT 19  ALKPHOS 78  BILITOT 1.2  PROT 6.1*  ALBUMIN 3.6   Recent Labs    07/27/22 0000 07/31/22 1448  WBC 8.1 7.0  HGB 12.3 13.3  HCT 37 41.6  MCV  --  95.2  PLT 194 179   Lab Results  Component Value Date   TSH 1.03 08/13/2022   Lab  Results  Component Value Date   HGBA1C 6.6 08/13/2022   Lab Results  Component Value Date   CHOL 192 03/19/2021   HDL 69 03/19/2021   LDLCALC 107 (H) 03/19/2021   LDLDIRECT 114.7 08/18/2011   TRIG 78 03/19/2021   CHOLHDL 2.8 03/19/2021    Significant Diagnostic Results in last 30 days:  No results found.  Assessment/Plan   Cough  C/o intermittent dry cough  Lungs- coarse breath sounds  Will start robitussin q6 prn  Will get chest x ray    Lower extremity swelling  Chronic As per nursing staff no recent change Pt stated that she does not like to use compression stockings Instructed patient to keep feet elevated  Cont with lasix  Cont with potassium supplements Limit salt intake  COPD  Minimal wheezing  No fevers Will cont with  supplemental o2 Cont with budesonide - albuterol   Insomnia Discussed regarding sleep hygiene Limit day time naps Cont with ambien   GAD  Will increase lexapro to 20 mg  Pt hasn't required to use ativan, will stop ativan if she does  not use it .   Family/ staff Communication: care plan discussed with the nursing staff   Labs/tests ordered:  chest x ray  I spent greater than  30 minutes for the care of this patient in face to face time, chart review, clinical documentation, patient education.

## 2022-11-26 DIAGNOSIS — N1831 Chronic kidney disease, stage 3a: Secondary | ICD-10-CM | POA: Diagnosis not present

## 2022-11-26 DIAGNOSIS — J449 Chronic obstructive pulmonary disease, unspecified: Secondary | ICD-10-CM | POA: Diagnosis not present

## 2022-11-26 DIAGNOSIS — F02A4 Dementia in other diseases classified elsewhere, mild, with anxiety: Secondary | ICD-10-CM | POA: Diagnosis not present

## 2022-11-26 DIAGNOSIS — J309 Allergic rhinitis, unspecified: Secondary | ICD-10-CM | POA: Diagnosis not present

## 2022-11-26 DIAGNOSIS — H900 Conductive hearing loss, bilateral: Secondary | ICD-10-CM | POA: Diagnosis not present

## 2022-11-26 DIAGNOSIS — I5020 Unspecified systolic (congestive) heart failure: Secondary | ICD-10-CM | POA: Diagnosis not present

## 2022-11-27 DIAGNOSIS — F02A4 Dementia in other diseases classified elsewhere, mild, with anxiety: Secondary | ICD-10-CM | POA: Diagnosis not present

## 2022-11-27 DIAGNOSIS — H900 Conductive hearing loss, bilateral: Secondary | ICD-10-CM | POA: Diagnosis not present

## 2022-11-27 DIAGNOSIS — J449 Chronic obstructive pulmonary disease, unspecified: Secondary | ICD-10-CM | POA: Diagnosis not present

## 2022-11-27 DIAGNOSIS — I5020 Unspecified systolic (congestive) heart failure: Secondary | ICD-10-CM | POA: Diagnosis not present

## 2022-11-27 DIAGNOSIS — J309 Allergic rhinitis, unspecified: Secondary | ICD-10-CM | POA: Diagnosis not present

## 2022-11-27 DIAGNOSIS — N1831 Chronic kidney disease, stage 3a: Secondary | ICD-10-CM | POA: Diagnosis not present

## 2022-11-28 DIAGNOSIS — H900 Conductive hearing loss, bilateral: Secondary | ICD-10-CM | POA: Diagnosis not present

## 2022-11-28 DIAGNOSIS — J309 Allergic rhinitis, unspecified: Secondary | ICD-10-CM | POA: Diagnosis not present

## 2022-11-28 DIAGNOSIS — I5020 Unspecified systolic (congestive) heart failure: Secondary | ICD-10-CM | POA: Diagnosis not present

## 2022-11-28 DIAGNOSIS — N1831 Chronic kidney disease, stage 3a: Secondary | ICD-10-CM | POA: Diagnosis not present

## 2022-11-28 DIAGNOSIS — J449 Chronic obstructive pulmonary disease, unspecified: Secondary | ICD-10-CM | POA: Diagnosis not present

## 2022-11-28 DIAGNOSIS — F02A4 Dementia in other diseases classified elsewhere, mild, with anxiety: Secondary | ICD-10-CM | POA: Diagnosis not present

## 2022-11-29 ENCOUNTER — Encounter: Payer: Self-pay | Admitting: Sports Medicine

## 2022-11-29 DIAGNOSIS — J309 Allergic rhinitis, unspecified: Secondary | ICD-10-CM | POA: Diagnosis not present

## 2022-11-29 DIAGNOSIS — F02A4 Dementia in other diseases classified elsewhere, mild, with anxiety: Secondary | ICD-10-CM | POA: Diagnosis not present

## 2022-11-29 DIAGNOSIS — H900 Conductive hearing loss, bilateral: Secondary | ICD-10-CM | POA: Diagnosis not present

## 2022-11-29 DIAGNOSIS — N1831 Chronic kidney disease, stage 3a: Secondary | ICD-10-CM | POA: Diagnosis not present

## 2022-11-29 DIAGNOSIS — I5020 Unspecified systolic (congestive) heart failure: Secondary | ICD-10-CM | POA: Diagnosis not present

## 2022-11-29 DIAGNOSIS — J449 Chronic obstructive pulmonary disease, unspecified: Secondary | ICD-10-CM | POA: Diagnosis not present

## 2022-11-30 DIAGNOSIS — J449 Chronic obstructive pulmonary disease, unspecified: Secondary | ICD-10-CM | POA: Diagnosis not present

## 2022-11-30 DIAGNOSIS — F02A4 Dementia in other diseases classified elsewhere, mild, with anxiety: Secondary | ICD-10-CM | POA: Diagnosis not present

## 2022-11-30 DIAGNOSIS — N1831 Chronic kidney disease, stage 3a: Secondary | ICD-10-CM | POA: Diagnosis not present

## 2022-11-30 DIAGNOSIS — I5020 Unspecified systolic (congestive) heart failure: Secondary | ICD-10-CM | POA: Diagnosis not present

## 2022-11-30 DIAGNOSIS — H900 Conductive hearing loss, bilateral: Secondary | ICD-10-CM | POA: Diagnosis not present

## 2022-11-30 DIAGNOSIS — J309 Allergic rhinitis, unspecified: Secondary | ICD-10-CM | POA: Diagnosis not present

## 2022-12-01 DIAGNOSIS — F02A4 Dementia in other diseases classified elsewhere, mild, with anxiety: Secondary | ICD-10-CM | POA: Diagnosis not present

## 2022-12-01 DIAGNOSIS — H900 Conductive hearing loss, bilateral: Secondary | ICD-10-CM | POA: Diagnosis not present

## 2022-12-01 DIAGNOSIS — J449 Chronic obstructive pulmonary disease, unspecified: Secondary | ICD-10-CM | POA: Diagnosis not present

## 2022-12-01 DIAGNOSIS — N1831 Chronic kidney disease, stage 3a: Secondary | ICD-10-CM | POA: Diagnosis not present

## 2022-12-01 DIAGNOSIS — J309 Allergic rhinitis, unspecified: Secondary | ICD-10-CM | POA: Diagnosis not present

## 2022-12-01 DIAGNOSIS — I5020 Unspecified systolic (congestive) heart failure: Secondary | ICD-10-CM | POA: Diagnosis not present

## 2022-12-02 DIAGNOSIS — F02A4 Dementia in other diseases classified elsewhere, mild, with anxiety: Secondary | ICD-10-CM | POA: Diagnosis not present

## 2022-12-02 DIAGNOSIS — N1831 Chronic kidney disease, stage 3a: Secondary | ICD-10-CM | POA: Diagnosis not present

## 2022-12-02 DIAGNOSIS — H900 Conductive hearing loss, bilateral: Secondary | ICD-10-CM | POA: Diagnosis not present

## 2022-12-02 DIAGNOSIS — I5020 Unspecified systolic (congestive) heart failure: Secondary | ICD-10-CM | POA: Diagnosis not present

## 2022-12-02 DIAGNOSIS — J309 Allergic rhinitis, unspecified: Secondary | ICD-10-CM | POA: Diagnosis not present

## 2022-12-02 DIAGNOSIS — J449 Chronic obstructive pulmonary disease, unspecified: Secondary | ICD-10-CM | POA: Diagnosis not present

## 2022-12-03 DIAGNOSIS — N1831 Chronic kidney disease, stage 3a: Secondary | ICD-10-CM | POA: Diagnosis not present

## 2022-12-03 DIAGNOSIS — J449 Chronic obstructive pulmonary disease, unspecified: Secondary | ICD-10-CM | POA: Diagnosis not present

## 2022-12-03 DIAGNOSIS — J309 Allergic rhinitis, unspecified: Secondary | ICD-10-CM | POA: Diagnosis not present

## 2022-12-03 DIAGNOSIS — I5020 Unspecified systolic (congestive) heart failure: Secondary | ICD-10-CM | POA: Diagnosis not present

## 2022-12-03 DIAGNOSIS — H900 Conductive hearing loss, bilateral: Secondary | ICD-10-CM | POA: Diagnosis not present

## 2022-12-03 DIAGNOSIS — F02A4 Dementia in other diseases classified elsewhere, mild, with anxiety: Secondary | ICD-10-CM | POA: Diagnosis not present

## 2022-12-04 DIAGNOSIS — N1831 Chronic kidney disease, stage 3a: Secondary | ICD-10-CM | POA: Diagnosis not present

## 2022-12-04 DIAGNOSIS — J309 Allergic rhinitis, unspecified: Secondary | ICD-10-CM | POA: Diagnosis not present

## 2022-12-04 DIAGNOSIS — I5020 Unspecified systolic (congestive) heart failure: Secondary | ICD-10-CM | POA: Diagnosis not present

## 2022-12-04 DIAGNOSIS — H900 Conductive hearing loss, bilateral: Secondary | ICD-10-CM | POA: Diagnosis not present

## 2022-12-04 DIAGNOSIS — F02A4 Dementia in other diseases classified elsewhere, mild, with anxiety: Secondary | ICD-10-CM | POA: Diagnosis not present

## 2022-12-04 DIAGNOSIS — J449 Chronic obstructive pulmonary disease, unspecified: Secondary | ICD-10-CM | POA: Diagnosis not present

## 2022-12-05 DIAGNOSIS — N1831 Chronic kidney disease, stage 3a: Secondary | ICD-10-CM | POA: Diagnosis not present

## 2022-12-05 DIAGNOSIS — J449 Chronic obstructive pulmonary disease, unspecified: Secondary | ICD-10-CM | POA: Diagnosis not present

## 2022-12-05 DIAGNOSIS — J309 Allergic rhinitis, unspecified: Secondary | ICD-10-CM | POA: Diagnosis not present

## 2022-12-05 DIAGNOSIS — H900 Conductive hearing loss, bilateral: Secondary | ICD-10-CM | POA: Diagnosis not present

## 2022-12-05 DIAGNOSIS — F02A4 Dementia in other diseases classified elsewhere, mild, with anxiety: Secondary | ICD-10-CM | POA: Diagnosis not present

## 2022-12-05 DIAGNOSIS — I5020 Unspecified systolic (congestive) heart failure: Secondary | ICD-10-CM | POA: Diagnosis not present

## 2022-12-06 DIAGNOSIS — N1831 Chronic kidney disease, stage 3a: Secondary | ICD-10-CM | POA: Diagnosis not present

## 2022-12-06 DIAGNOSIS — J449 Chronic obstructive pulmonary disease, unspecified: Secondary | ICD-10-CM | POA: Diagnosis not present

## 2022-12-06 DIAGNOSIS — I5020 Unspecified systolic (congestive) heart failure: Secondary | ICD-10-CM | POA: Diagnosis not present

## 2022-12-06 DIAGNOSIS — H900 Conductive hearing loss, bilateral: Secondary | ICD-10-CM | POA: Diagnosis not present

## 2022-12-06 DIAGNOSIS — J309 Allergic rhinitis, unspecified: Secondary | ICD-10-CM | POA: Diagnosis not present

## 2022-12-06 DIAGNOSIS — F02A4 Dementia in other diseases classified elsewhere, mild, with anxiety: Secondary | ICD-10-CM | POA: Diagnosis not present

## 2022-12-07 DIAGNOSIS — F02A4 Dementia in other diseases classified elsewhere, mild, with anxiety: Secondary | ICD-10-CM | POA: Diagnosis not present

## 2022-12-07 DIAGNOSIS — J309 Allergic rhinitis, unspecified: Secondary | ICD-10-CM | POA: Diagnosis not present

## 2022-12-07 DIAGNOSIS — J449 Chronic obstructive pulmonary disease, unspecified: Secondary | ICD-10-CM | POA: Diagnosis not present

## 2022-12-07 DIAGNOSIS — I5020 Unspecified systolic (congestive) heart failure: Secondary | ICD-10-CM | POA: Diagnosis not present

## 2022-12-07 DIAGNOSIS — N1831 Chronic kidney disease, stage 3a: Secondary | ICD-10-CM | POA: Diagnosis not present

## 2022-12-07 DIAGNOSIS — H900 Conductive hearing loss, bilateral: Secondary | ICD-10-CM | POA: Diagnosis not present

## 2022-12-08 DIAGNOSIS — J309 Allergic rhinitis, unspecified: Secondary | ICD-10-CM | POA: Diagnosis not present

## 2022-12-08 DIAGNOSIS — N1831 Chronic kidney disease, stage 3a: Secondary | ICD-10-CM | POA: Diagnosis not present

## 2022-12-08 DIAGNOSIS — J449 Chronic obstructive pulmonary disease, unspecified: Secondary | ICD-10-CM | POA: Diagnosis not present

## 2022-12-08 DIAGNOSIS — I5020 Unspecified systolic (congestive) heart failure: Secondary | ICD-10-CM | POA: Diagnosis not present

## 2022-12-08 DIAGNOSIS — F02A4 Dementia in other diseases classified elsewhere, mild, with anxiety: Secondary | ICD-10-CM | POA: Diagnosis not present

## 2022-12-08 DIAGNOSIS — H900 Conductive hearing loss, bilateral: Secondary | ICD-10-CM | POA: Diagnosis not present

## 2022-12-09 DIAGNOSIS — J449 Chronic obstructive pulmonary disease, unspecified: Secondary | ICD-10-CM | POA: Diagnosis not present

## 2022-12-09 DIAGNOSIS — H900 Conductive hearing loss, bilateral: Secondary | ICD-10-CM | POA: Diagnosis not present

## 2022-12-09 DIAGNOSIS — I5020 Unspecified systolic (congestive) heart failure: Secondary | ICD-10-CM | POA: Diagnosis not present

## 2022-12-09 DIAGNOSIS — N1831 Chronic kidney disease, stage 3a: Secondary | ICD-10-CM | POA: Diagnosis not present

## 2022-12-09 DIAGNOSIS — F02A4 Dementia in other diseases classified elsewhere, mild, with anxiety: Secondary | ICD-10-CM | POA: Diagnosis not present

## 2022-12-09 DIAGNOSIS — J309 Allergic rhinitis, unspecified: Secondary | ICD-10-CM | POA: Diagnosis not present

## 2022-12-10 DIAGNOSIS — F02A4 Dementia in other diseases classified elsewhere, mild, with anxiety: Secondary | ICD-10-CM | POA: Diagnosis not present

## 2022-12-10 DIAGNOSIS — J449 Chronic obstructive pulmonary disease, unspecified: Secondary | ICD-10-CM | POA: Diagnosis not present

## 2022-12-10 DIAGNOSIS — J309 Allergic rhinitis, unspecified: Secondary | ICD-10-CM | POA: Diagnosis not present

## 2022-12-10 DIAGNOSIS — H900 Conductive hearing loss, bilateral: Secondary | ICD-10-CM | POA: Diagnosis not present

## 2022-12-10 DIAGNOSIS — I5020 Unspecified systolic (congestive) heart failure: Secondary | ICD-10-CM | POA: Diagnosis not present

## 2022-12-10 DIAGNOSIS — N1831 Chronic kidney disease, stage 3a: Secondary | ICD-10-CM | POA: Diagnosis not present

## 2022-12-11 DIAGNOSIS — I5020 Unspecified systolic (congestive) heart failure: Secondary | ICD-10-CM | POA: Diagnosis not present

## 2022-12-11 DIAGNOSIS — H900 Conductive hearing loss, bilateral: Secondary | ICD-10-CM | POA: Diagnosis not present

## 2022-12-11 DIAGNOSIS — J309 Allergic rhinitis, unspecified: Secondary | ICD-10-CM | POA: Diagnosis not present

## 2022-12-11 DIAGNOSIS — N1831 Chronic kidney disease, stage 3a: Secondary | ICD-10-CM | POA: Diagnosis not present

## 2022-12-11 DIAGNOSIS — J449 Chronic obstructive pulmonary disease, unspecified: Secondary | ICD-10-CM | POA: Diagnosis not present

## 2022-12-11 DIAGNOSIS — F02A4 Dementia in other diseases classified elsewhere, mild, with anxiety: Secondary | ICD-10-CM | POA: Diagnosis not present

## 2022-12-12 DIAGNOSIS — J309 Allergic rhinitis, unspecified: Secondary | ICD-10-CM | POA: Diagnosis not present

## 2022-12-12 DIAGNOSIS — N1831 Chronic kidney disease, stage 3a: Secondary | ICD-10-CM | POA: Diagnosis not present

## 2022-12-12 DIAGNOSIS — J449 Chronic obstructive pulmonary disease, unspecified: Secondary | ICD-10-CM | POA: Diagnosis not present

## 2022-12-12 DIAGNOSIS — F02A4 Dementia in other diseases classified elsewhere, mild, with anxiety: Secondary | ICD-10-CM | POA: Diagnosis not present

## 2022-12-12 DIAGNOSIS — H900 Conductive hearing loss, bilateral: Secondary | ICD-10-CM | POA: Diagnosis not present

## 2022-12-12 DIAGNOSIS — I5020 Unspecified systolic (congestive) heart failure: Secondary | ICD-10-CM | POA: Diagnosis not present

## 2022-12-13 DIAGNOSIS — J309 Allergic rhinitis, unspecified: Secondary | ICD-10-CM | POA: Diagnosis not present

## 2022-12-13 DIAGNOSIS — F02A4 Dementia in other diseases classified elsewhere, mild, with anxiety: Secondary | ICD-10-CM | POA: Diagnosis not present

## 2022-12-13 DIAGNOSIS — J449 Chronic obstructive pulmonary disease, unspecified: Secondary | ICD-10-CM | POA: Diagnosis not present

## 2022-12-13 DIAGNOSIS — N1831 Chronic kidney disease, stage 3a: Secondary | ICD-10-CM | POA: Diagnosis not present

## 2022-12-13 DIAGNOSIS — I5020 Unspecified systolic (congestive) heart failure: Secondary | ICD-10-CM | POA: Diagnosis not present

## 2022-12-13 DIAGNOSIS — H900 Conductive hearing loss, bilateral: Secondary | ICD-10-CM | POA: Diagnosis not present

## 2022-12-14 DIAGNOSIS — I5020 Unspecified systolic (congestive) heart failure: Secondary | ICD-10-CM | POA: Diagnosis not present

## 2022-12-14 DIAGNOSIS — J449 Chronic obstructive pulmonary disease, unspecified: Secondary | ICD-10-CM | POA: Diagnosis not present

## 2022-12-14 DIAGNOSIS — N1831 Chronic kidney disease, stage 3a: Secondary | ICD-10-CM | POA: Diagnosis not present

## 2022-12-14 DIAGNOSIS — J309 Allergic rhinitis, unspecified: Secondary | ICD-10-CM | POA: Diagnosis not present

## 2022-12-14 DIAGNOSIS — F02A4 Dementia in other diseases classified elsewhere, mild, with anxiety: Secondary | ICD-10-CM | POA: Diagnosis not present

## 2022-12-14 DIAGNOSIS — H900 Conductive hearing loss, bilateral: Secondary | ICD-10-CM | POA: Diagnosis not present

## 2022-12-15 DIAGNOSIS — J309 Allergic rhinitis, unspecified: Secondary | ICD-10-CM | POA: Diagnosis not present

## 2022-12-15 DIAGNOSIS — N1831 Chronic kidney disease, stage 3a: Secondary | ICD-10-CM | POA: Diagnosis not present

## 2022-12-15 DIAGNOSIS — H900 Conductive hearing loss, bilateral: Secondary | ICD-10-CM | POA: Diagnosis not present

## 2022-12-15 DIAGNOSIS — F02A4 Dementia in other diseases classified elsewhere, mild, with anxiety: Secondary | ICD-10-CM | POA: Diagnosis not present

## 2022-12-15 DIAGNOSIS — I5020 Unspecified systolic (congestive) heart failure: Secondary | ICD-10-CM | POA: Diagnosis not present

## 2022-12-15 DIAGNOSIS — J449 Chronic obstructive pulmonary disease, unspecified: Secondary | ICD-10-CM | POA: Diagnosis not present

## 2022-12-16 DIAGNOSIS — I5020 Unspecified systolic (congestive) heart failure: Secondary | ICD-10-CM | POA: Diagnosis not present

## 2022-12-16 DIAGNOSIS — J309 Allergic rhinitis, unspecified: Secondary | ICD-10-CM | POA: Diagnosis not present

## 2022-12-16 DIAGNOSIS — F02A4 Dementia in other diseases classified elsewhere, mild, with anxiety: Secondary | ICD-10-CM | POA: Diagnosis not present

## 2022-12-16 DIAGNOSIS — J449 Chronic obstructive pulmonary disease, unspecified: Secondary | ICD-10-CM | POA: Diagnosis not present

## 2022-12-16 DIAGNOSIS — N1831 Chronic kidney disease, stage 3a: Secondary | ICD-10-CM | POA: Diagnosis not present

## 2022-12-16 DIAGNOSIS — H900 Conductive hearing loss, bilateral: Secondary | ICD-10-CM | POA: Diagnosis not present

## 2022-12-17 DIAGNOSIS — K219 Gastro-esophageal reflux disease without esophagitis: Secondary | ICD-10-CM | POA: Diagnosis not present

## 2022-12-17 DIAGNOSIS — R12 Heartburn: Secondary | ICD-10-CM | POA: Diagnosis not present

## 2022-12-17 DIAGNOSIS — I5020 Unspecified systolic (congestive) heart failure: Secondary | ICD-10-CM | POA: Diagnosis not present

## 2022-12-17 DIAGNOSIS — N1831 Chronic kidney disease, stage 3a: Secondary | ICD-10-CM | POA: Diagnosis not present

## 2022-12-17 DIAGNOSIS — J309 Allergic rhinitis, unspecified: Secondary | ICD-10-CM | POA: Diagnosis not present

## 2022-12-17 DIAGNOSIS — D649 Anemia, unspecified: Secondary | ICD-10-CM | POA: Diagnosis not present

## 2022-12-17 DIAGNOSIS — F02A4 Dementia in other diseases classified elsewhere, mild, with anxiety: Secondary | ICD-10-CM | POA: Diagnosis not present

## 2022-12-17 DIAGNOSIS — H900 Conductive hearing loss, bilateral: Secondary | ICD-10-CM | POA: Diagnosis not present

## 2022-12-17 DIAGNOSIS — J42 Unspecified chronic bronchitis: Secondary | ICD-10-CM | POA: Diagnosis not present

## 2022-12-17 DIAGNOSIS — F32A Depression, unspecified: Secondary | ICD-10-CM | POA: Diagnosis not present

## 2022-12-17 DIAGNOSIS — J449 Chronic obstructive pulmonary disease, unspecified: Secondary | ICD-10-CM | POA: Diagnosis not present

## 2022-12-17 DIAGNOSIS — G4700 Insomnia, unspecified: Secondary | ICD-10-CM | POA: Diagnosis not present

## 2022-12-21 DIAGNOSIS — J449 Chronic obstructive pulmonary disease, unspecified: Secondary | ICD-10-CM | POA: Diagnosis not present

## 2022-12-21 DIAGNOSIS — J309 Allergic rhinitis, unspecified: Secondary | ICD-10-CM | POA: Diagnosis not present

## 2022-12-21 DIAGNOSIS — I5020 Unspecified systolic (congestive) heart failure: Secondary | ICD-10-CM | POA: Diagnosis not present

## 2022-12-21 DIAGNOSIS — H900 Conductive hearing loss, bilateral: Secondary | ICD-10-CM | POA: Diagnosis not present

## 2022-12-21 DIAGNOSIS — N1831 Chronic kidney disease, stage 3a: Secondary | ICD-10-CM | POA: Diagnosis not present

## 2022-12-21 DIAGNOSIS — F02A4 Dementia in other diseases classified elsewhere, mild, with anxiety: Secondary | ICD-10-CM | POA: Diagnosis not present

## 2022-12-23 DIAGNOSIS — J449 Chronic obstructive pulmonary disease, unspecified: Secondary | ICD-10-CM | POA: Diagnosis not present

## 2022-12-24 DIAGNOSIS — I5020 Unspecified systolic (congestive) heart failure: Secondary | ICD-10-CM | POA: Diagnosis not present

## 2022-12-24 DIAGNOSIS — J309 Allergic rhinitis, unspecified: Secondary | ICD-10-CM | POA: Diagnosis not present

## 2022-12-24 DIAGNOSIS — H900 Conductive hearing loss, bilateral: Secondary | ICD-10-CM | POA: Diagnosis not present

## 2022-12-24 DIAGNOSIS — J449 Chronic obstructive pulmonary disease, unspecified: Secondary | ICD-10-CM | POA: Diagnosis not present

## 2022-12-24 DIAGNOSIS — F02A4 Dementia in other diseases classified elsewhere, mild, with anxiety: Secondary | ICD-10-CM | POA: Diagnosis not present

## 2022-12-24 DIAGNOSIS — N1831 Chronic kidney disease, stage 3a: Secondary | ICD-10-CM | POA: Diagnosis not present

## 2022-12-28 DIAGNOSIS — J309 Allergic rhinitis, unspecified: Secondary | ICD-10-CM | POA: Diagnosis not present

## 2022-12-28 DIAGNOSIS — N1831 Chronic kidney disease, stage 3a: Secondary | ICD-10-CM | POA: Diagnosis not present

## 2022-12-28 DIAGNOSIS — H900 Conductive hearing loss, bilateral: Secondary | ICD-10-CM | POA: Diagnosis not present

## 2022-12-28 DIAGNOSIS — I5020 Unspecified systolic (congestive) heart failure: Secondary | ICD-10-CM | POA: Diagnosis not present

## 2022-12-28 DIAGNOSIS — F02A4 Dementia in other diseases classified elsewhere, mild, with anxiety: Secondary | ICD-10-CM | POA: Diagnosis not present

## 2022-12-28 DIAGNOSIS — J449 Chronic obstructive pulmonary disease, unspecified: Secondary | ICD-10-CM | POA: Diagnosis not present

## 2022-12-29 ENCOUNTER — Encounter: Payer: Self-pay | Admitting: Nurse Practitioner

## 2022-12-29 ENCOUNTER — Non-Acute Institutional Stay (SKILLED_NURSING_FACILITY): Payer: Medicare Other | Admitting: Nurse Practitioner

## 2022-12-29 DIAGNOSIS — M159 Polyosteoarthritis, unspecified: Secondary | ICD-10-CM | POA: Diagnosis not present

## 2022-12-29 DIAGNOSIS — J4489 Other specified chronic obstructive pulmonary disease: Secondary | ICD-10-CM | POA: Diagnosis not present

## 2022-12-29 DIAGNOSIS — K219 Gastro-esophageal reflux disease without esophagitis: Secondary | ICD-10-CM | POA: Diagnosis not present

## 2022-12-29 DIAGNOSIS — F419 Anxiety disorder, unspecified: Secondary | ICD-10-CM

## 2022-12-29 DIAGNOSIS — R300 Dysuria: Secondary | ICD-10-CM | POA: Diagnosis not present

## 2022-12-29 DIAGNOSIS — N1831 Chronic kidney disease, stage 3a: Secondary | ICD-10-CM | POA: Diagnosis not present

## 2022-12-29 DIAGNOSIS — W19XXXA Unspecified fall, initial encounter: Secondary | ICD-10-CM

## 2022-12-29 DIAGNOSIS — I10 Essential (primary) hypertension: Secondary | ICD-10-CM

## 2022-12-29 DIAGNOSIS — D62 Acute posthemorrhagic anemia: Secondary | ICD-10-CM | POA: Diagnosis not present

## 2022-12-29 DIAGNOSIS — M797 Fibromyalgia: Secondary | ICD-10-CM | POA: Diagnosis not present

## 2022-12-29 DIAGNOSIS — I509 Heart failure, unspecified: Secondary | ICD-10-CM | POA: Diagnosis not present

## 2022-12-29 DIAGNOSIS — F5105 Insomnia due to other mental disorder: Secondary | ICD-10-CM | POA: Diagnosis not present

## 2022-12-29 NOTE — Assessment & Plan Note (Signed)
Bun/creat 20/1.1 11/09/22

## 2022-12-29 NOTE — Assessment & Plan Note (Signed)
clinically presumed. 08/16/22 Echo mild aortic stenosis, normal left ventricular systolic function, no EF.  BNP in 364 08/12/22>>248 09/21/22, chronic edema BLE, on Furosemide.

## 2022-12-29 NOTE — Assessment & Plan Note (Signed)
Chronic lower back, leg pain,  R shoulder pain, had Ortho eval, takes Lyrica, Tylenol, Norco

## 2022-12-29 NOTE — Assessment & Plan Note (Signed)
Blood pressure is controlled, off Metoprolol. 

## 2022-12-29 NOTE — Assessment & Plan Note (Addendum)
walker for ambulation,  risk falling, last fall 12/28/22, resulted skin tear R elbow, R wrist, no active bleeding or s/s of infection.  Supervision and assistance for safety.  C/o left sided rib cage tenderness, no bruise noted, pain with movement.  Obtain CXR, X-ray left ribs

## 2022-12-29 NOTE — Assessment & Plan Note (Signed)
Stable, continue Ambien, Lorazepam, Lexapro.

## 2022-12-29 NOTE — Assessment & Plan Note (Signed)
on  Nitrofurantoin 50mg  qd for UTI suppression, Dr. Vernie Ammons, Hx of Estrace vaginal cream for atrophic vaginitis.

## 2022-12-29 NOTE — Assessment & Plan Note (Signed)
GERD/chronic gastritis/atrophic gastritis, GI bleed 02/2020, s/p EGD, per biopsy, takes Protonix. F/u GI prn 

## 2022-12-29 NOTE — Assessment & Plan Note (Signed)
intermittent chronic mild expiratory wheezes, c/o SOB, uses O2 more than prior,  takes Benzonatate, DuoNeb, prn Albuterol/Budsonide, cough is chronic, on Prednisone low dose. treated with higher dose of Prednisone for symptomatic control.

## 2022-12-29 NOTE — Assessment & Plan Note (Addendum)
takes Norco, Lyrica

## 2022-12-29 NOTE — Assessment & Plan Note (Signed)
Hx of Hgb 6.8 in hospital s/p 2 units of PRBC, pharm dc Vit B12, on Iron, Hgb 13.3 07/31/22

## 2022-12-29 NOTE — Progress Notes (Unsigned)
Location:  [facility] Room Number: 060-A Place of Service:  SNF (31) Provider:  Jerame Hedding,ManX,NP  Cedric Denison X, NP  Patient Care Team: Magaly Pollina X, NP as PCP - General (Internal Medicine) Castella Lerner X, NP as Nurse Practitioner (Internal Medicine)  Extended Emergency Contact Information Primary Emergency Contact: Stanke,Jeff Home Phone: [phone] Relation: Son Secondary Emergency Contact: Culbreth Sr.,Christopher Home Phone: [phone] Relation: Relative  Code Status:  DNR Goals of care: Advanced Directive information    12/29/2022    3:56 PM  Advanced Directives  Does Patient Have a Medical Advance Directive? Yes  Type of Advance Directive Living will;Out of facility DNR (pink MOST or yellow form)  Does patient want to make changes to medical advance directive? No - Patient declined  Pre-existing out of facility DNR order (yellow form or pink MOST form) Yellow form placed in chart (order not valid for inpatient use)     Chief Complaint  Patient presents with   Medical Management of Chronic Issues    Routine visit and discuss tdap and pneumonia vaccines.    HPI:  Pt is a 89 y.o. female seen today for medical management of chronic diseases.    Gait abnormality, walker for ambulation,  risk falling, last fall 12/28/22, resulted skin tear R elbow, R wrist, no active bleeding or s/s of infection. C/o left sided rib cage tenderness, no bruise noted, pain with movement.   CHF clinically presumed. 08/16/22 Echo mild aortic stenosis, normal left ventricular systolic function, no EF.  BNP in 364 08/12/22>>248 09/21/22, chronic edema BLE, on Furosemide.              COPD, intermittent chronic mild expiratory wheezes, c/o SOB, uses O2 more than prior,  takes Benzonatate, DuoNeb, prn Albuterol/Budsonide, cough is chronic, on Prednisone low dose. treated with higher dose of Prednisone for symptomatic control.  Hx of CVA, CT x2 03/2021 showed no acute findings except age  indeterminate right corona radiator infarct, small vessel changes. Neurology recommended no further work ups.              Macular degeneration, f/u [facility]: OU for [place] MD involving central area. OS exudative age related MD             Chronic lower back, leg pain,  R shoulder pain, had Ortho eval, takes Lyrica, Tylenol, Norco.              HTN, off  Metoprolol.              Urinary symptoms, on  Nitrofurantoin 50mg  qd for UTI suppression, Dr. Vernie Ammons Hx of Estrace vaginal cream for atrophic vaginitis.              Anemia, Hx of Hgb 6.8 in hospital s/p 2 units of PRBC, pharm dc Vit B12, on Iron, Hgb 13.3 07/31/22             CKD Bun/creat 20/1.1 11/09/22             GERD/chronic gastritis/atrophic gastritis, GI bleed 02/2020, s/p EGD, per biopsy, takes Protonix. F/u GI prn Fibromyalgia takes Norco, lyrica             Tactile hallucinations, off meds. TSH 1.03              Constipation, takes MiraLax,  Colace               Prediabetes, Hgb a1c 6.6 08/12/22  Hypokalemia, K 3.7 11/09/22             Insomnia, takes Ambien, Lexapro, prn Lorazepam.                  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     Fibromyalgia    GERD 12/19/2006   Qualifier: Diagnosis of  By: Renaldo Fiddler CMA, Marliss Czar  04/13/16 TSH 1.20, Na 141, K 4.2, Bun 15, creat  0.79, wbc 5.5, Hgb 11.5, plt 283    GERD (gastroesophageal reflux disease)    Hammer toe of second toe of left foot 04/08/2016   Left 2nd   Hearing loss    Hemorrhoids    Hypercholesterolemia    Hypertension    Osteoarthritis 12/19/2006   Qualifier: Diagnosis of  By: Renaldo Fiddler CMA, Leigh     Osteoporosis    Past Surgical History:  Procedure Laterality Date   BIOPSY  02/17/2020   Procedure: BIOPSY;  Surgeon: Lynann Bologna, MD;  Location: [facility];  Service: Endoscopy;;   CATARACT EXTRACTION, BILATERAL  2009   COLONOSCOPY  2009, 2006 , 2017   ESOPHAGOGASTRODUODENOSCOPY (EGD) WITH PROPOFOL N/A 02/17/2020   Procedure: ESOPHAGOGASTRODUODENOSCOPY (EGD) WITH PROPOFOL;  Surgeon: Lynann Bologna, MD;  Location: [facility];  Service: Endoscopy;  Laterality: N/A;   IR ANGIOGRAM SELECTIVE EACH ADDITIONAL VESSEL  02/17/2020   IR ANGIOGRAM SELECTIVE EACH ADDITIONAL VESSEL  02/17/2020   IR ANGIOGRAM SELECTIVE EACH ADDITIONAL VESSEL  02/17/2020   IR ANGIOGRAM SELECTIVE EACH ADDITIONAL VESSEL  02/17/2020   IR ANGIOGRAM SELECTIVE EACH ADDITIONAL VESSEL  02/17/2020   IR ANGIOGRAM VISCERAL SELECTIVE  02/17/2020   IR ANGIOGRAM VISCERAL SELECTIVE  02/17/2020   IR US GUIDE VASC ACCESS RIGHT  02/17/2020   stapes surgery     Dr Dorma Russell   TONSILLECTOMY AND ADENOIDECTOMY     as a child    Allergies  Allergen Reactions   Penicillins Anaphylaxis   Bisphosphonates Other (See Comments)    [name]   Influenza Vaccines     Unknown   Simvastatin     Unknown    Trazodone And Nefazodone     Felt her throat was closing up/kept her awake   Sulfonamide Derivatives Rash and Other (See Comments)    blisters    Outpatient Encounter Medications as of 12/29/2022  Medication Sig   acetaminophen (TYLENOL) 325 MG tablet Take 650 mg by mouth every 4 (four) hours as needed for fever.  Give 650 mg by mouth at bedtime   albuterol (VENTOLIN HFA) 108 (90 Base) MCG/ACT inhaler Inhale into the lungs every 6 (six) hours as needed for  wheezing or shortness of breath.   Albuterol-Budesonide 90-80 MCG/ACT AERO Inhale 2 puffs into the  lungs every 6 (six) hours as needed (shortness of breath/ wheezing).   benzonatate (TESSALON) 100 MG capsule Take 100 mg by mouth 2 (two) times daily.   budesonide-formoterol (SYMBICORT) 80-4.5 MCG/ACT inhaler Inhale 1 puff into the lungs daily.   calcium carbonate (ANTACID) 750 MG chewable tablet Chew 1 tablet by mouth daily.   cholecalciferol (VITAMIN D) 1000 UNITS tablet Take 1,000 Units by mouth daily.   diclofenac Sodium (VOLTAREN) 1 % GEL Apply 2 g topically every 8 (eight) hours as needed.   docusate sodium (COLACE) 100 MG capsule Take 100 mg by mouth 2 (two) times daily.   escitalopram (LEXAPRO) 5 MG tablet Take 5 mg by mouth daily.   ferrous sulfate 325 (65 FE) MG tablet Take 325 mg by mouth. Once A Day on Mon, Wed, Fri   furosemide (LASIX) 20 MG tablet Take 20 mg by mouth daily.   HYDROcodone-acetaminophen (NORCO/VICODIN) 5-325 MG tablet Take 1 tablet by mouth every 6 (six) hours as needed for moderate pain.   hydrocortisone cream 1 % Apply 1 application topically 2 (two) times daily as needed (wrists).   hydrocortisone valerate cream (WESTCORT) 0.2 % Apply 1 application topically 2 (two) times daily as needed (for itching). Cleanse behind ears with gentle cleanser and dry completely.   ipratropium-albuterol (DUONEB) 0.5-2.5 (3) MG/3ML SOLN Take 3 mLs by nebulization daily.   ipratropium-albuterol (DUONEB) 0.5-2.5 (3) MG/3ML SOLN Take 3 mLs by nebulization in the morning and at bedtime. 1 vial inhale orally every 24 hours as needed for Wheezing   ketoconazole (NIZORAL) 2 % cream Apply 1 application  topically 2 (two) times daily as needed for irritation.   nitrofurantoin (MACRODANTIN) 50 MG capsule Take 50 mg by mouth at bedtime.   Olopatadine HCl 0.2 % SOLN Place 1 drop into both eyes daily.   polyethylene glycol (MIRALAX / GLYCOLAX) 17 g packet Take 17 g by mouth 2 (two) times a week.  Sunday and Wednesday   potassium chloride (KLOR-CON) 20 MEQ packet Take 20 mEq by mouth 2 (two) times daily.   pramoxine (SARNA SENSITIVE) 1 % LOTN Apply to cheeks topically one time a day for rosy burning cheeks   predniSONE (DELTASONE) 5 MG tablet Take 5 mg by mouth daily with breakfast.   pregabalin (LYRICA) 75 MG capsule Take 1 capsule (75 mg total) by mouth at bedtime.   Propylene Glycol (SYSTANE COMPLETE) 0.6 % SOLN Instill 1 drop in both eyes four times a day for DRY EYE   vitamin B-12 (CYANOCOBALAMIN) 1000 MCG tablet Take 1,000 mcg by mouth daily.   zolpidem (AMBIEN) 5 MG tablet Take 1 tablet (5 mg total) by mouth at bedtime.   furosemide (LASIX) 40 MG tablet Take 1 tablet (40 mg total) by mouth daily for 2 days.   LORazepam (ATIVAN) 0.5 MG tablet Take 0.5 mg by mouth every 4 (four) hours as needed for anxiety. (Patient not taking: Reported on 12/29/2022)   methocarbamol (ROBAXIN) 500 MG tablet Take 500 mg by mouth every 6 (six) hours as needed for muscle spasms. (Patient not taking: Reported on 11/25/2022)   No facility-administered encounter medications on file as of 12/29/2022.    Review of Systems  Constitutional:  Positive for fatigue. Negative for appetite change and fever.  HENT:  Positive for hearing loss. Negative for congestion and voice change.   Eyes:  Negative for visual disturbance.       C/o blurred vision, ? Color, ? Right eye peripheral vision loss-f/u Ophthalmology  Respiratory:  Positive for cough and shortness of breath. Negative for chest tightness and wheezing.        DOE, chronic hacking cough  Cardiovascular:  Positive for chest pain and leg swelling. Negative for palpitations.  Gastrointestinal:  Negative for abdominal pain and constipation.  Genitourinary:  Negative for dysuria and urgency.       Chronic, on and off  Musculoskeletal:  Positive for arthralgias, back pain, gait problem and myalgias. Negative for joint swelling.       New mid back pain,  mostly in the muscle next to thoracic spine palpated.   Skin:  Positive for wound.  Neurological:  Negative for speech difficulty, weakness and light-headedness.  Psychiatric/Behavioral:  Positive for sleep disturbance. Negative for behavioral problems and hallucinations.        Chronic going to bed late, sleeping in late.     Immunization History  Administered Date(s) Administered   Moderna SARS-COV2 Booster Vaccination 12/25/2019   Moderna Sars-Covid-2 Vaccination 02/17/2019, 03/17/2019   Pneumococcal Conjugate-13 09/01/2015   Pneumococcal Polysaccharide-23 07/07/1998   Td 08/21/2009   Tdap 09/22/2022   Pertinent  Health Maintenance Due  Topic Date Due   DEXA SCAN  Completed   INFLUENZA VACCINE  Discontinued      03/20/2021   12:00 AM 03/20/2021   12:00 PM 02/05/2022    9:16 AM 03/09/2022   10:01 AM 07/23/2022    3:44 PM  Fall Risk  Falls in the past year?   0 0 0  Was there an injury with Fall?   0 0 0  Fall Risk Category Calculator   0 0 0  Fall Risk Category (Retired)   Low    (RETIRED) Patient Fall Risk Level High fall risk High fall risk High fall risk    Patient at Risk for Falls Due to   History of fall(s) No Fall Risks No Fall Risks  Fall risk Follow up   Falls evaluation completed Falls evaluation completed Falls evaluation completed   Functional Status Survey:    Vitals:   12/29/22 1548 12/29/22 1552  BP: 133/62 134/60  Pulse: 74 74  Resp: 16 16  Temp: (!) 97.4 F (36.3 C) (!) 97.2 F (36.2 C)  SpO2: 97% 97%  Weight:  160 lb 14.4 oz (73 kg)  Height:  5' (1.524 m)   Body mass index is 31.42 kg/m. Physical Exam Vitals and nursing note reviewed.  Constitutional:      Comments: Fatigue, sleepy  HENT:     Head: Normocephalic and atraumatic.     Nose: Nose normal.     Mouth/Throat:     Mouth: Mucous membranes are moist.  Eyes:     Extraocular Movements: Extraocular movements intact.     Conjunctiva/sclera: Conjunctivae normal.     Pupils: Pupils are  equal, round, and reactive to light.     Comments: Able to count my fingers 3 feet away from her eyes.   Cardiovascular:     Rate and Rhythm: Normal rate and regular rhythm.     Heart sounds: No murmur heard. Pulmonary:     Effort: Pulmonary effort is normal.     Breath sounds: Rales present. No wheezing or rhonchi.     Comments: Decreased air entry to both lungs. Needs O2 via [place] to maintain SatO2>90%. Moist rales bibasilar.  Chest:     Chest wall: No tenderness.  Abdominal:     General: Bowel sounds are normal.     Palpations: Abdomen is soft.  Tenderness: There is no abdominal tenderness.  Musculoskeletal:        General: Tenderness present.     Cervical back: Normal range of motion and neck supple.     Right lower leg: Edema present.     Left lower leg: Edema present.     Comments: mild edema BLE. R shoulder pain with overhead ROM. Better with mid back pain Discomfort left sided rib cage with movement.   Skin:    General: Skin is warm and dry.     Comments: Skin tear right elbow, right wrist, no active bleeding or s/s of infection.   Neurological:     General: No focal deficit present.     Mental Status: She is alert. Mental status is at baseline.     Motor: No weakness.     Coordination: Coordination normal.     Gait: Gait abnormal.     Comments: Oriented to person, place.   Psychiatric:        Mood and Affect: Mood normal.        Behavior: Behavior normal.     Labs reviewed: Recent Labs    07/31/22 1448 08/13/22 0000 08/24/22 0000 11/09/22 0000  NA 137 142 141 139  K 3.6 3.1* 4.1 3.7  CL 100 101 103 98*  CO2 27 29* 31* 32*  GLUCOSE 112*  --   --   --   BUN 16 22* 18 20  CREATININE 1.25* 1.2* 1.1 1.1  CALCIUM 9.1 8.8 8.8 9.0   Recent Labs    07/31/22 1448  AST 20  ALT 19  ALKPHOS 78  BILITOT 1.2  PROT 6.1*  ALBUMIN 3.6   Recent Labs    07/27/22 0000 07/31/22 1448  WBC 8.1 7.0  HGB 12.3 13.3  HCT 37 41.6  MCV  --  95.2  PLT 194 179   Lab  Results  Component Value Date   TSH 1.03 08/13/2022   Lab Results  Component Value Date   HGBA1C 6.6 08/13/2022   Lab Results  Component Value Date   CHOL 192 03/19/2021   HDL 69 03/19/2021   LDLCALC 107 (H) 03/19/2021   LDLDIRECT 114.7 08/18/2011   TRIG 78 03/19/2021   CHOLHDL 2.8 03/19/2021    Significant Diagnostic Results in last 30 days:  No results found.  Assessment/Plan Fall walker for ambulation,  risk falling, last fall 12/28/22, resulted skin tear R elbow, R wrist, no active bleeding or s/s of infection.  Supervision and assistance for safety.  C/o left sided rib cage tenderness, no bruise noted, pain with movement.  Obtain CXR, X-ray left ribs  Congestive heart failure (CHF) (HCC) clinically presumed. 08/16/22 Echo mild aortic stenosis, normal left ventricular systolic function, no EF.  BNP in 364 08/12/22>>248 09/21/22, chronic edema BLE, on Furosemide.   COPD (chronic obstructive pulmonary disease) with chronic bronchitis intermittent chronic mild expiratory wheezes, c/o SOB, uses O2 more than prior,  takes Benzonatate, DuoNeb, prn Albuterol/Budsonide, cough is chronic, on Prednisone low dose. treated with higher dose of Prednisone for symptomatic control.   Osteoarthritis Chronic lower back, leg pain,  R shoulder pain, had Ortho eval, takes Lyrica, Tylenol, Norco.   HTN (hypertension) Blood pressure is controlled, off Metoprolol.   Dysuria  on  Nitrofurantoin 50mg  qd for UTI suppression, Dr. Vernie Ammons, Hx of Estrace vaginal cream for atrophic vaginitis.   ABLA (acute blood loss anemia) Hx of Hgb 6.8 in hospital s/p 2 units of PRBC, pharm dc Vit B12, on Iron,  Hgb 13.3 07/31/22  CKD (chronic kidney disease) stage 3, GFR 30-59 ml/min (HCC) Bun/creat 20/1.1 11/09/22  GERD   GERD/chronic gastritis/atrophic gastritis, GI bleed 02/2020, s/p EGD, per biopsy, takes Protonix. F/u GI prn  Fibromyalgia takes Norco, off Lyrica  Insomnia secondary to anxiety Stable,  continue Ambien, Lorazepam, Lexapro.      Family/ staff Communication: plan of care reviewed with the patient and charge nurse.   Labs/tests ordered:  Xray CXR, ribs left  Time spend 30 minutes.

## 2022-12-31 DIAGNOSIS — N1831 Chronic kidney disease, stage 3a: Secondary | ICD-10-CM | POA: Diagnosis not present

## 2022-12-31 DIAGNOSIS — I5020 Unspecified systolic (congestive) heart failure: Secondary | ICD-10-CM | POA: Diagnosis not present

## 2022-12-31 DIAGNOSIS — J449 Chronic obstructive pulmonary disease, unspecified: Secondary | ICD-10-CM | POA: Diagnosis not present

## 2022-12-31 DIAGNOSIS — H900 Conductive hearing loss, bilateral: Secondary | ICD-10-CM | POA: Diagnosis not present

## 2022-12-31 DIAGNOSIS — F02A4 Dementia in other diseases classified elsewhere, mild, with anxiety: Secondary | ICD-10-CM | POA: Diagnosis not present

## 2022-12-31 DIAGNOSIS — J309 Allergic rhinitis, unspecified: Secondary | ICD-10-CM | POA: Diagnosis not present

## 2023-01-04 DIAGNOSIS — H900 Conductive hearing loss, bilateral: Secondary | ICD-10-CM | POA: Diagnosis not present

## 2023-01-04 DIAGNOSIS — I5020 Unspecified systolic (congestive) heart failure: Secondary | ICD-10-CM | POA: Diagnosis not present

## 2023-01-04 DIAGNOSIS — N1831 Chronic kidney disease, stage 3a: Secondary | ICD-10-CM | POA: Diagnosis not present

## 2023-01-04 DIAGNOSIS — J309 Allergic rhinitis, unspecified: Secondary | ICD-10-CM | POA: Diagnosis not present

## 2023-01-04 DIAGNOSIS — F02A4 Dementia in other diseases classified elsewhere, mild, with anxiety: Secondary | ICD-10-CM | POA: Diagnosis not present

## 2023-01-04 DIAGNOSIS — J449 Chronic obstructive pulmonary disease, unspecified: Secondary | ICD-10-CM | POA: Diagnosis not present

## 2023-01-06 DIAGNOSIS — I5020 Unspecified systolic (congestive) heart failure: Secondary | ICD-10-CM | POA: Diagnosis not present

## 2023-01-06 DIAGNOSIS — N1831 Chronic kidney disease, stage 3a: Secondary | ICD-10-CM | POA: Diagnosis not present

## 2023-01-06 DIAGNOSIS — J309 Allergic rhinitis, unspecified: Secondary | ICD-10-CM | POA: Diagnosis not present

## 2023-01-06 DIAGNOSIS — H900 Conductive hearing loss, bilateral: Secondary | ICD-10-CM | POA: Diagnosis not present

## 2023-01-06 DIAGNOSIS — J449 Chronic obstructive pulmonary disease, unspecified: Secondary | ICD-10-CM | POA: Diagnosis not present

## 2023-01-06 DIAGNOSIS — F02A4 Dementia in other diseases classified elsewhere, mild, with anxiety: Secondary | ICD-10-CM | POA: Diagnosis not present

## 2023-01-07 ENCOUNTER — Encounter: Payer: Self-pay | Admitting: Nurse Practitioner

## 2023-01-07 ENCOUNTER — Non-Acute Institutional Stay (SKILLED_NURSING_FACILITY): Payer: Self-pay | Admitting: Nurse Practitioner

## 2023-01-07 DIAGNOSIS — K219 Gastro-esophageal reflux disease without esophagitis: Secondary | ICD-10-CM | POA: Diagnosis not present

## 2023-01-07 DIAGNOSIS — N1831 Chronic kidney disease, stage 3a: Secondary | ICD-10-CM

## 2023-01-07 DIAGNOSIS — R269 Unspecified abnormalities of gait and mobility: Secondary | ICD-10-CM | POA: Diagnosis not present

## 2023-01-07 DIAGNOSIS — F5105 Insomnia due to other mental disorder: Secondary | ICD-10-CM

## 2023-01-07 DIAGNOSIS — J4489 Other specified chronic obstructive pulmonary disease: Secondary | ICD-10-CM

## 2023-01-07 DIAGNOSIS — J309 Allergic rhinitis, unspecified: Secondary | ICD-10-CM | POA: Diagnosis not present

## 2023-01-07 DIAGNOSIS — M797 Fibromyalgia: Secondary | ICD-10-CM

## 2023-01-07 DIAGNOSIS — H900 Conductive hearing loss, bilateral: Secondary | ICD-10-CM | POA: Diagnosis not present

## 2023-01-07 DIAGNOSIS — R079 Chest pain, unspecified: Secondary | ICD-10-CM | POA: Diagnosis not present

## 2023-01-07 DIAGNOSIS — D649 Anemia, unspecified: Secondary | ICD-10-CM

## 2023-01-07 DIAGNOSIS — K5901 Slow transit constipation: Secondary | ICD-10-CM

## 2023-01-07 DIAGNOSIS — I509 Heart failure, unspecified: Secondary | ICD-10-CM | POA: Diagnosis not present

## 2023-01-07 DIAGNOSIS — Z66 Do not resuscitate: Secondary | ICD-10-CM

## 2023-01-07 DIAGNOSIS — M159 Polyosteoarthritis, unspecified: Secondary | ICD-10-CM | POA: Diagnosis not present

## 2023-01-07 DIAGNOSIS — J449 Chronic obstructive pulmonary disease, unspecified: Secondary | ICD-10-CM | POA: Diagnosis not present

## 2023-01-07 DIAGNOSIS — I5020 Unspecified systolic (congestive) heart failure: Secondary | ICD-10-CM | POA: Diagnosis not present

## 2023-01-07 DIAGNOSIS — F419 Anxiety disorder, unspecified: Secondary | ICD-10-CM | POA: Diagnosis not present

## 2023-01-07 DIAGNOSIS — F02A4 Dementia in other diseases classified elsewhere, mild, with anxiety: Secondary | ICD-10-CM | POA: Diagnosis not present

## 2023-01-07 NOTE — Progress Notes (Signed)
Location:   SNF [facility] Room Number: 60A Place of Service:  SNF (31) Provider: Arna Snipe Margurette Brener NP  Alastor Kneale X, NP  Patient Care Team: Vondra Aldredge X, NP as PCP - General (Internal Medicine) Remberto Lienhard X, NP as Nurse Practitioner (Internal Medicine)  Extended Emergency Contact Information Primary Emergency Contact: Fulp,Jeff Home Phone: [phone] Relation: Son Secondary Emergency Contact: Culbreth Sr.,Christopher Home Phone: [phone] Relation: Relative  Code Status: DNR Goals of care: Advanced Directive information    12/29/2022    3:56 PM  Advanced Directives  Does Patient Have a Medical Advance Directive? Yes  Type of Advance Directive Living will;Out of facility DNR (pink MOST or yellow form)  Does patient want to make changes to medical advance directive? No - Patient declined  Pre-existing out of facility DNR order (yellow form or pink MOST form) Yellow form placed in chart (order not valid for inpatient use)     Chief Complaint  Patient presents with   Acute Visit    Left rib cage pain under the left breast, O2 desaturation.     HPI:  Pt is a 89 y.o. female seen today for an acute visit for the left rib cage pain under the breast, palpation and with movement, O2 desaturation Sat O2 in 80s with O2 2-3lpm via [place]. Chronic non productive cough at her baseline. CXR showed central pulmonary venous congestion w/o frank pulmonary edema, chronic lung disease, no acute findings of osseous structures. Afebrile. Morphine started per Hospice recommendation 01/06/23, better pain controlled the patient stated.      Gait abnormality, walker for ambulation,  risk falling, last fall 12/28/22, resulted skin tear R elbow, R wrist, lateral lower left leg-healing nicely. C/o left sided rib cage tenderness, no bruise noted, pain with movement.              CHF clinically presumed. 08/16/22 Echo mild aortic stenosis, normal left ventricular systolic function, no EF.  BNP in 364  08/12/22>>248 09/21/22, chronic edema BLE, on Furosemide.              COPD, intermittent chronic mild expiratory wheezes, c/o SOB, uses O2 more than prior,  takes Benzonatate, DuoNeb, prn Albuterol/Budsonide, cough is chronic, on Prednisone low dose. treated with higher dose of Prednisone for symptomatic control.  Hx of CVA, CT x2 03/2021 showed no acute findings except age indeterminate right corona radiator infarct, small vessel changes. Neurology recommended no further work ups.              Macular degeneration, f/u [facility]: OU for [place] MD involving central area. OS exudative age related MD             Chronic lower back, leg pain,  R shoulder pain, had Ortho eval, takes Lyrica, Tylenol, Norco.              HTN, off  Metoprolol.              Urinary symptoms, on  Nitrofurantoin 50mg  qd for UTI suppression, Dr. Vernie Ammons Hx of Estrace vaginal cream for atrophic vaginitis.              Anemia, Hx of Hgb 6.8 in hospital s/p 2 units of PRBC, pharm dc Vit B12, on Iron, Hgb 13.3 07/31/22             CKD Bun/creat 20/1.1 11/09/22             GERD/chronic gastritis/atrophic gastritis, GI bleed 02/2020, s/p EGD, per biopsy,  takes Protonix. F/u GI prn Fibromyalgia takes Morphine, lyrica             Tactile hallucinations, off meds. 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     Fibromyalgia    GERD 12/19/2006   Qualifier: Diagnosis of  By: Renaldo Fiddler CMA, Marliss Czar  04/13/16 TSH 1.20, Na 141, K 4.2, Bun 15, creat 0.79, wbc 5.5, Hgb 11.5, plt 283    GERD (gastroesophageal reflux disease)    Hammer toe of second toe of left foot 04/08/2016   Left 2nd   Hearing loss    Hemorrhoids    Hypercholesterolemia    Hypertension    Osteoarthritis 12/19/2006   Qualifier: Diagnosis of  By: Renaldo Fiddler CMA, Leigh     Osteoporosis    Past Surgical History:  Procedure Laterality Date   BIOPSY  02/17/2020   Procedure: BIOPSY;  Surgeon: Lynann Bologna, MD;  Location: [facility];  Service: Endoscopy;;   CATARACT EXTRACTION, BILATERAL  2009   COLONOSCOPY  2009, 2006 , 2017   ESOPHAGOGASTRODUODENOSCOPY (EGD) WITH PROPOFOL N/A 02/17/2020   Procedure: ESOPHAGOGASTRODUODENOSCOPY (EGD) WITH PROPOFOL;  Surgeon: Lynann Bologna, MD;  Location: [facility];  Service: Endoscopy;  Laterality: N/A;   IR ANGIOGRAM SELECTIVE EACH ADDITIONAL VESSEL  02/17/2020   IR ANGIOGRAM SELECTIVE EACH ADDITIONAL VESSEL  02/17/2020   IR ANGIOGRAM SELECTIVE EACH ADDITIONAL VESSEL  02/17/2020   IR ANGIOGRAM SELECTIVE EACH ADDITIONAL VESSEL  02/17/2020   IR ANGIOGRAM SELECTIVE EACH ADDITIONAL VESSEL  02/17/2020   IR ANGIOGRAM VISCERAL SELECTIVE  02/17/2020   IR ANGIOGRAM VISCERAL SELECTIVE  02/17/2020   IR US GUIDE VASC ACCESS RIGHT  02/17/2020   stapes surgery     Dr Dorma Russell   TONSILLECTOMY AND ADENOIDECTOMY     as a child    Allergies  Allergen Reactions   Penicillins Anaphylaxis   Bisphosphonates Other (See Comments)    [name]   Influenza Vaccines     Unknown   Simvastatin     Unknown    Trazodone And Nefazodone     Felt her throat was closing up/kept her awake   Sulfonamide Derivatives Rash and Other  (See Comments)    blisters    Allergies as of 01/07/2023       Reactions   Penicillins  Anaphylaxis   Bisphosphonates Other (See Comments)   [name]   Influenza Vaccines    Unknown   Simvastatin    Unknown    Trazodone And Nefazodone    Felt her throat was closing up/kept her awake   Sulfonamide Derivatives Rash, Other (See Comments)   blisters        Medication List        Accurate as of January 07, 2023 12:38 PM. If you have any questions, ask your nurse or doctor.          acetaminophen 325 MG tablet Commonly known as: TYLENOL Take 650 mg by mouth every 4 (four) hours as needed for fever.  Give 650 mg by mouth at bedtime   albuterol 108 (90 Base) MCG/ACT inhaler Commonly known as: VENTOLIN HFA Inhale into the lungs every 6 (six) hours as needed for wheezing or shortness of breath.   Albuterol-Budesonide 90-80 MCG/ACT Aero Inhale 2 puffs into the lungs every 6 (six) hours as needed (shortness of breath/ wheezing).   Antacid 750 MG chewable tablet Generic drug: calcium carbonate Chew 1 tablet by mouth daily.   benzonatate 100 MG capsule Commonly known as: TESSALON Take 100 mg by mouth 2 (two) times daily.   budesonide-formoterol 80-4.5 MCG/ACT inhaler Commonly known as: SYMBICORT Inhale 1 puff into the lungs daily.   cholecalciferol 1000 units tablet Commonly known as: VITAMIN D Take 1,000 Units by mouth daily.   cyanocobalamin 1000 MCG tablet Commonly known as: VITAMIN B12 Take 1,000 mcg by mouth daily.   docusate sodium 100 MG capsule Commonly known as: COLACE Take 100 mg by mouth 2 (two) times daily.   escitalopram 5 MG tablet Commonly known as: LEXAPRO Take 5 mg by mouth daily.   ferrous sulfate 325 (65 FE) MG tablet Take 325 mg by mouth. Once A Day on Mon, Wed, Fri   furosemide 20 MG tablet Commonly known as: LASIX Take 20 mg by mouth daily.   furosemide 40 MG tablet Commonly known as: LASIX Take 1 tablet (40 mg total) by  mouth daily for 2 days.   HYDROcodone-acetaminophen 5-325 MG tablet Commonly known as: NORCO/VICODIN Take 1 tablet by mouth every 6 (six) hours as needed for moderate pain.   hydrocortisone cream 1 % Apply 1 application topically 2 (two) times daily as needed (wrists).   hydrocortisone valerate cream 0.2 % Commonly known as: WESTCORT Apply 1 application topically 2 (two) times daily as needed (for itching). Cleanse behind ears with gentle cleanser and dry completely.   ipratropium-albuterol 0.5-2.5 (3) MG/3ML Soln Commonly known as: DUONEB Take 3 mLs by nebulization daily.   ipratropium-albuterol 0.5-2.5 (3) MG/3ML Soln Commonly known as: DUONEB Take 3 mLs by nebulization in the morning and at bedtime. 1 vial inhale orally every 24 hours as needed for Wheezing   ketoconazole 2 % cream Commonly known as: NIZORAL Apply 1 application  topically 2 (two) times daily as needed for irritation.   LORazepam 0.5 MG tablet Commonly known as: ATIVAN Take 0.5 mg by mouth every 4 (four) hours as needed for anxiety.   methocarbamol 500 MG tablet Commonly known as: ROBAXIN Take 500 mg by mouth every 6 (six) hours as needed for muscle spasms.   nitrofurantoin 50 MG capsule Commonly known as: MACRODANTIN Take 50 mg by mouth at bedtime.   Olopatadine HCl 0.2 % Soln Place 1 drop into both eyes daily.   polyethylene glycol 17 g packet Commonly known as: MIRALAX / GLYCOLAX Take 17  g by mouth 2 (two) times a week. Sunday and Wednesday   potassium chloride 20 MEQ packet Commonly known as: KLOR-CON Take 20 mEq by mouth 2 (two) times daily.   predniSONE 5 MG tablet Commonly known as: DELTASONE Take 5 mg by mouth daily with breakfast.   pregabalin 75 MG capsule Commonly known as: LYRICA Take 1 capsule (75 mg total) by mouth at bedtime.   Sarna Sensitive 1 % Lotn Generic drug: pramoxine Apply to cheeks topically one time a day for rosy burning cheeks   Systane Complete 0.6 %  Soln Generic drug: Propylene Glycol Instill 1 drop in both eyes four times a day for DRY EYE   Voltaren 1 % Gel Generic drug: diclofenac Sodium Apply 2 g topically every 8 (eight) hours as needed.   zolpidem 5 MG tablet Commonly known as: Ambien Take 1 tablet (5 mg total) by mouth at bedtime.        Review of Systems  Constitutional:  Positive for fatigue. Negative for appetite change and fever.  HENT:  Positive for hearing loss. Negative for congestion and voice change.   Eyes:  Negative for visual disturbance.       C/o blurred vision, ? Color, ? Right eye peripheral vision loss-f/u Ophthalmology  Respiratory:  Positive for cough and shortness of breath. Negative for chest tightness and wheezing.        DOE, chronic hacking cough  Cardiovascular:  Positive for chest pain and leg swelling. Negative for palpitations.  Gastrointestinal:  Negative for abdominal pain and constipation.  Genitourinary:  Negative for dysuria and urgency.       Chronic, on and off  Musculoskeletal:  Positive for arthralgias, back pain, gait problem and myalgias. Negative for joint swelling.       New mid back pain, mostly in the muscle next to thoracic spine palpated.   Skin:  Positive for wound.  Neurological:  Negative for speech difficulty, weakness and light-headedness.  Psychiatric/Behavioral:  Negative for behavioral problems and hallucinations.        Chronic going to bed late, sleeping in late.     Immunization History  Administered Date(s) Administered   Moderna SARS-COV2 Booster Vaccination 12/25/2019   Moderna Sars-Covid-2 Vaccination 02/17/2019, 03/17/2019   Pneumococcal Conjugate-13 09/01/2015   Pneumococcal Polysaccharide-23 07/07/1998   Td 08/21/2009   Tdap 09/22/2022   Pertinent  Health Maintenance Due  Topic Date Due   DEXA SCAN  Completed   INFLUENZA VACCINE  Discontinued      03/20/2021   12:00 AM 03/20/2021   12:00 PM 02/05/2022    9:16 AM 03/09/2022   10:01 AM 07/23/2022     3:44 PM  Fall Risk  Falls in the past year?   0 0 0  Was there an injury with Fall?   0 0 0  Fall Risk Category Calculator   0 0 0  Fall Risk Category (Retired)   Low    (RETIRED) Patient Fall Risk Level High fall risk High fall risk High fall risk    Patient at Risk for Falls Due to   History of fall(s) No Fall Risks No Fall Risks  Fall risk Follow up   Falls evaluation completed Falls evaluation completed Falls evaluation completed   Functional Status Survey:    Vitals:   01/07/23 1151  BP: (!) 141/67  Pulse: 72  Resp: 20  Temp: (!) 97.4 F (36.3 C)  SpO2: (!) 80%  Weight: 160 lb 14.4 oz (73 kg)  Body mass index is 31.42 kg/m. Physical Exam Vitals and nursing note reviewed.  Constitutional:      Comments: Fatigue  HENT:     Head: Normocephalic and atraumatic.     Nose: Nose normal.     Mouth/Throat:     Mouth: Mucous membranes are moist.  Eyes:     Extraocular Movements: Extraocular movements intact.     Conjunctiva/sclera: Conjunctivae normal.     Pupils: Pupils are equal, round, and reactive to light.     Comments: Able to count my fingers 3 feet away from her eyes.   Cardiovascular:     Rate and Rhythm: Normal rate and regular rhythm.     Heart sounds: No murmur heard. Pulmonary:     Effort: Pulmonary effort is normal.     Breath sounds: Rales present. No wheezing or rhonchi.     Comments: Decreased air entry to both lungs. Needs O2 via [place] to maintain SatO2>90%. Moist rales bibasilar.  Left rib cage pain under the breast palpated and with movement.  Chest:     Chest wall: Tenderness present.  Abdominal:     General: Bowel sounds are normal.     Palpations: Abdomen is soft.     Tenderness: There is no abdominal tenderness.  Musculoskeletal:        General: Tenderness present.     Cervical back: Normal range of motion and neck supple.     Right lower leg: Edema present.     Left lower leg: Edema present.     Comments: mild edema BLE. R shoulder pain  with overhead ROM.  Discomfort left sided rib cage with movement.   Skin:    General: Skin is warm and dry.     Comments: Skin tear right elbow, right wrist, lateral left lower leg above ankle, healing nicely.   Neurological:     General: No focal deficit present.     Mental Status: She is alert. Mental status is at baseline.     Motor: No weakness.     Coordination: Coordination normal.     Gait: Gait abnormal.     Comments: Oriented to person, place.   Psychiatric:        Mood and Affect: Mood normal.        Behavior: Behavior normal.     Labs reviewed: Recent Labs    07/31/22 1448 08/13/22 0000 08/24/22 0000 11/09/22 0000  NA 137 142 141 139  K 3.6 3.1* 4.1 3.7  CL 100 101 103 98*  CO2 27 29* 31* 32*  GLUCOSE 112*  --   --   --   BUN 16 22* 18 20  CREATININE 1.25* 1.2* 1.1 1.1  CALCIUM 9.1 8.8 8.8 9.0   Recent Labs    07/31/22 1448  AST 20  ALT 19  ALKPHOS 78  BILITOT 1.2  PROT 6.1*  ALBUMIN 3.6   Recent Labs    07/27/22 0000 07/31/22 1448  WBC 8.1 7.0  HGB 12.3 13.3  HCT 37 41.6  MCV  --  95.2  PLT 194 179   Lab Results  Component Value Date   TSH 1.03 08/13/2022   Lab Results  Component Value Date   HGBA1C 6.6 08/13/2022   Lab Results  Component Value Date   CHOL 192 03/19/2021   HDL 69 03/19/2021   LDLCALC 107 (H) 03/19/2021   LDLDIRECT 114.7 08/18/2011   TRIG 78 03/19/2021   CHOLHDL 2.8 03/19/2021    Significant Diagnostic Results  in last 30 days:  No results found.  Assessment/Plan: Osteoarthritis the left rib cage pain under the breast, palpation and with movement, O2 desaturation Sat O2 in 80s with O2 2-3lpm via [place]. Chronic non productive cough at her baseline. CXR showed central pulmonary venous congestion w/o frank pulmonary edema, chronic lung disease, no acute findings of osseous structures. Afebrile. Morphine started per Hospice recommendation 01/06/23, better pain controlled the patient stated. Continue Morphine for pain  control.   COPD (chronic obstructive pulmonary disease) with chronic bronchitis O2 desaturation in 80s, chronic O2 dependent. Chronic non productive cough at her baseline. CXR showed central pulmonary venous congestion w/o frank pulmonary edema, chronic lung disease, no acute findings of osseous structures. Will increase O2 to 3lpm via [place] to maintain Sat O2>90% Obtain CBC/diff, CMP/eGFR  Congestive heart failure (CHF) (HCC) O2 desaturation Sat O2 in 80s with O2 2-3lpm via [place]. Chronic non productive cough at her baseline. CXR showed central pulmonary venous congestion w/o frank pulmonary edema, chronic lung disease, no acute findings of osseous structures. Continue Furosemide 40mg  every day, extra dose of Furosemide 40mg  po today and tomorrow Observe.   Gait abnormality Gait abnormality, walker for ambulation,  risk falling, last fall 12/28/22, resulted skin tear R elbow, R wrist, lateral lower left leg-healing nicely.  Chronic anemia  Hx of Hgb 6.8 in hospital s/p 2 units of PRBC, pharm dc Vit B12, on Iron, Hgb 13.3 07/31/22  CKD (chronic kidney disease) stage 3, GFR 30-59 ml/min (HCC) Bun/creat 20/1.1 11/09/22  GERD Stable, GERD/chronic gastritis/atrophic gastritis, GI bleed 02/2020, s/p EGD, per biopsy, takes Protonix. F/u GI prn  Fibromyalgia Morphine, lyrica  Slow transit constipation Stable, takes MiraLax,  Colace    Insomnia secondary to anxiety Stable, continue Lexapro, Ambien.     Family/ staff Communication: plan of care reviewed with the patient and charge nurse  Labs/tests ordered:  CXR and L rib xray done today, CBC/diff, CMP/eGFR  Time spend 30 minutes.

## 2023-01-07 NOTE — Assessment & Plan Note (Signed)
the left rib cage pain under the breast, palpation and with movement, O2 desaturation Sat O2 in 80s with O2 2-3lpm via [place]. Chronic non productive cough at her baseline. CXR showed central pulmonary venous congestion w/o frank pulmonary edema, chronic lung disease, no acute findings of osseous structures. Afebrile. Morphine started per Hospice recommendation 01/06/23, better pain controlled the patient stated. Continue Morphine for pain control.

## 2023-01-07 NOTE — Assessment & Plan Note (Signed)
Morphine, lyrica

## 2023-01-07 NOTE — Assessment & Plan Note (Signed)
Stable, GERD/chronic gastritis/atrophic gastritis, GI bleed 02/2020, s/p EGD, per biopsy, takes Protonix. F/u GI prn

## 2023-01-07 NOTE — Assessment & Plan Note (Signed)
Stable,  takes MiraLax, Colace.

## 2023-01-07 NOTE — Assessment & Plan Note (Signed)
Stable, continue Lexapro, Ambien.

## 2023-01-07 NOTE — Assessment & Plan Note (Signed)
Bun/creat 20/1.1 11/09/22

## 2023-01-07 NOTE — Assessment & Plan Note (Signed)
Gait abnormality, walker for ambulation,  risk falling, last fall 12/28/22, resulted skin tear R elbow, R wrist, lateral lower left leg-healing nicely.

## 2023-01-07 NOTE — Assessment & Plan Note (Signed)
O2 desaturation Sat O2 in 80s with O2 2-3lpm via [place]. Chronic non productive cough at her baseline. CXR showed central pulmonary venous congestion w/o frank pulmonary edema, chronic lung disease, no acute findings of osseous structures. Continue Furosemide 40mg  every day, extra dose of Furosemide 40mg  po today and tomorrow Observe.

## 2023-01-07 NOTE — Assessment & Plan Note (Addendum)
O2 desaturation in 80s, chronic O2 dependent. Chronic non productive cough at her baseline. CXR showed central pulmonary venous congestion w/o frank pulmonary edema, chronic lung disease, no acute findings of osseous structures. Will increase O2 to 3lpm via [place] to maintain Sat O2>90% Obtain CBC/diff, CMP/eGFR

## 2023-01-07 NOTE — Assessment & Plan Note (Signed)
Hx of Hgb 6.8 in hospital s/p 2 units of PRBC, pharm dc Vit B12, on Iron, Hgb 13.3 07/31/22

## 2023-01-07 NOTE — Progress Notes (Unsigned)
Location:  [facility] Room Number: 060-A Place of Service:  SNF (31) Provider:  Rahm Minix X,NP  Emon Miggins X, NP  Patient Care Team: Aerin Delany X, NP as PCP - General (Internal Medicine) Vernesha Talbot X, NP as Nurse Practitioner (Internal Medicine)  Extended Emergency Contact Information Primary Emergency Contact: Sampedro,Jeff Home Phone: [phone] Relation: Son Secondary Emergency Contact: Culbreth Sr.,Christopher Home Phone: [phone] Relation: Relative  Code Status:  DNR Goals of care: Advanced Directive information    01/07/2023    1:30 PM  Advanced Directives  Does Patient Have a Medical Advance Directive? Yes  Type of Advance Directive Living will;Out of facility DNR (pink MOST or yellow form)  Does patient want to make changes to medical advance directive? No - Patient declined  Pre-existing out of facility DNR order (yellow form or pink MOST form) Yellow form placed in chart (order not valid for inpatient use)     Chief Complaint  Patient presents with   Acute Visit    Low Oxygen and left rib cage pain.   Error    HPI:  Pt is a 89 y.o. 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     Fibromyalgia    GERD 12/19/2006   Qualifier: Diagnosis of  By: Renaldo Fiddler  CMA, Marliss Czar  04/13/16 TSH 1.20, Na 141, K 4.2, Bun 15, creat 0.79, wbc 5.5, Hgb 11.5, plt 283    GERD (gastroesophageal reflux disease)    Hammer toe of second toe of left foot 04/08/2016   Left 2nd   Hearing loss    Hemorrhoids    Hypercholesterolemia    Hypertension    Osteoarthritis 12/19/2006   Qualifier: Diagnosis of  By: Renaldo Fiddler CMA, Leigh     Osteoporosis    Past Surgical History:  Procedure Laterality Date   BIOPSY  02/17/2020   Procedure: BIOPSY;  Surgeon: Lynann Bologna, MD;  Location: [facility];  Service: Endoscopy;;   CATARACT EXTRACTION, BILATERAL  2009   COLONOSCOPY  2009, 2006 , 2017   ESOPHAGOGASTRODUODENOSCOPY (EGD) WITH PROPOFOL N/A 02/17/2020   Procedure: ESOPHAGOGASTRODUODENOSCOPY (EGD) WITH PROPOFOL;  Surgeon: Lynann Bologna, MD;  Location: [facility];  Service: Endoscopy;  Laterality: N/A;   IR ANGIOGRAM SELECTIVE EACH ADDITIONAL VESSEL  02/17/2020   IR ANGIOGRAM SELECTIVE EACH ADDITIONAL VESSEL  02/17/2020   IR ANGIOGRAM SELECTIVE EACH ADDITIONAL VESSEL  02/17/2020   IR ANGIOGRAM SELECTIVE EACH ADDITIONAL VESSEL  02/17/2020   IR ANGIOGRAM SELECTIVE EACH ADDITIONAL VESSEL  02/17/2020   IR ANGIOGRAM VISCERAL SELECTIVE  02/17/2020   IR ANGIOGRAM VISCERAL SELECTIVE  02/17/2020   IR US GUIDE VASC ACCESS RIGHT  02/17/2020   stapes surgery  Dr Dorma Russell   TONSILLECTOMY AND ADENOIDECTOMY     as a child    Allergies  Allergen Reactions   Penicillins Anaphylaxis   Bisphosphonates Other (See Comments)    [name]   Influenza Vaccines     Unknown   Simvastatin     Unknown    Trazodone And Nefazodone     Felt her throat was closing up/kept her awake   Sulfonamide Derivatives Rash and Other (See Comments)    blisters    Outpatient Encounter Medications as of 01/07/2023  Medication Sig   acetaminophen (TYLENOL) 325 MG tablet Take 650 mg by mouth every 4 (four) hours as needed for fever.  Give 650 mg by mouth at bedtime   albuterol (VENTOLIN HFA) 108 (90 Base) MCG/ACT  inhaler Inhale into the lungs every 6 (six) hours as needed for wheezing or shortness of breath.   Albuterol-Budesonide 90-80 MCG/ACT AERO Inhale 2 puffs into the lungs every 6 (six) hours as needed (shortness of breath/ wheezing).   benzonatate (TESSALON) 100 MG capsule Take 100 mg by mouth 2 (two) times daily.   budesonide-formoterol (SYMBICORT) 80-4.5 MCG/ACT inhaler Inhale 1 puff into the lungs daily.   calcium carbonate (ANTACID) 750 MG chewable tablet Chew 1 tablet by mouth daily.   cholecalciferol (VITAMIN D) 1000 UNITS tablet Take 1,000 Units by mouth daily.   diclofenac Sodium (VOLTAREN) 1 % GEL Apply 2 g topically every 8 (eight) hours as needed.   docusate sodium (COLACE) 100 MG capsule Take 100 mg by mouth 2 (two) times daily.   escitalopram (LEXAPRO) 20 MG tablet Take 20 mg by mouth at bedtime.   ferrous sulfate 325 (65 FE) MG tablet Take 325 mg by mouth. Once A Day on Mon, Wed, Fri   furosemide (LASIX) 20 MG tablet Take 20 mg by mouth daily.   HYDROcodone-acetaminophen (NORCO/VICODIN) 5-325 MG tablet Take 1 tablet by mouth every 6 (six) hours as needed for moderate pain.   hydrocortisone cream 1 % Apply 1 application topically 2 (two) times daily as needed (wrists).   hydrocortisone valerate cream (WESTCORT) 0.2 % Apply 1 application topically 2 (two) times daily as needed (for itching). Cleanse behind ears with gentle cleanser and dry completely.   ipratropium-albuterol (DUONEB) 0.5-2.5 (3) MG/3ML SOLN Take 3 mLs by nebulization daily.   ipratropium-albuterol (DUONEB) 0.5-2.5 (3) MG/3ML SOLN Take 3 mLs by nebulization in the morning and at bedtime. 1 vial inhale orally every 24 hours as needed for Wheezing   ketoconazole (NIZORAL) 2 % cream Apply 1 application  topically 2 (two) times daily as needed for irritation.   nitrofurantoin (MACRODANTIN) 50 MG capsule Take 50 mg by mouth at bedtime.   Olopatadine HCl 0.2 % SOLN Place 1 drop into both eyes daily.   polyethylene glycol  (MIRALAX / GLYCOLAX) 17 g packet Take 17 g by mouth 2 (two) times a week. Sunday and Wednesday   potassium chloride (KLOR-CON) 20 MEQ packet Take 20 mEq by mouth 2 (two) times daily.   pramoxine (SARNA SENSITIVE) 1 % LOTN Apply to cheeks topically one time a day for rosy burning cheeks   predniSONE (DELTASONE) 5 MG tablet Take 5 mg by mouth daily with breakfast.   pregabalin (LYRICA) 75 MG capsule Take 1 capsule (75 mg total) by mouth at bedtime.   Propylene Glycol (SYSTANE COMPLETE) 0.6 % SOLN Instill 1 drop in both eyes four times a day for DRY EYE   vitamin B-12 (CYANOCOBALAMIN) 1000 MCG tablet Take 1,000  mcg by mouth daily.   zolpidem (AMBIEN) 5 MG tablet Take 1 tablet (5 mg total) by mouth at bedtime.   furosemide (LASIX) 40 MG tablet Take 1 tablet (40 mg total) by mouth daily for 2 days.   LORazepam (ATIVAN) 0.5 MG tablet Take 0.5 mg by mouth every 4 (four) hours as needed for anxiety. (Patient not taking: Reported on 12/29/2022)   methocarbamol (ROBAXIN) 500 MG tablet Take 500 mg by mouth every 6 (six) hours as needed for muscle spasms. (Patient not taking: Reported on 11/25/2022)   No facility-administered encounter medications on file as of 01/07/2023.    Review of Systems  Immunization History  Administered Date(s) Administered   Moderna SARS-COV2 Booster Vaccination 12/25/2019   Moderna Sars-Covid-2 Vaccination 02/17/2019, 03/17/2019   Pneumococcal Conjugate-13 09/01/2015   Pneumococcal Polysaccharide-23 07/07/1998   Td 08/21/2009   Tdap 09/22/2022   Pertinent  Health Maintenance Due  Topic Date Due   DEXA SCAN  Completed   INFLUENZA VACCINE  Discontinued      03/20/2021   12:00 AM 03/20/2021   12:00 PM 02/05/2022    9:16 AM 03/09/2022   10:01 AM 07/23/2022    3:44 PM  Fall Risk  Falls in the past year?   0 0 0  Was there an injury with Fall?   0 0 0  Fall Risk Category Calculator   0 0 0  Fall Risk Category (Retired)   Low    (RETIRED) Patient Fall Risk Level High fall  risk High fall risk High fall risk    Patient at Risk for Falls Due to   History of fall(s) No Fall Risks No Fall Risks  Fall risk Follow up   Falls evaluation completed Falls evaluation completed Falls evaluation completed   Functional Status Survey:    Vitals:   01/07/23 1327  BP: (!) 141/67  Pulse: 72  Resp: 20  Temp: (!) 97.4 F (36.3 C)  SpO2: (!) 80%  Weight: 160 lb 14.4 oz (73 kg)  Height: 5' (1.524 m)   Body mass index is 31.42 kg/m. Physical Exam  Labs reviewed: Recent Labs    07/31/22 1448 08/13/22 0000 08/24/22 0000 11/09/22 0000  NA 137 142 141 139  K 3.6 3.1* 4.1 3.7  CL 100 101 103 98*  CO2 27 29* 31* 32*  GLUCOSE 112*  --   --   --   BUN 16 22* 18 20  CREATININE 1.25* 1.2* 1.1 1.1  CALCIUM 9.1 8.8 8.8 9.0   Recent Labs    07/31/22 1448  AST 20  ALT 19  ALKPHOS 78  BILITOT 1.2  PROT 6.1*  ALBUMIN 3.6   Recent Labs    07/27/22 0000 07/31/22 1448  WBC 8.1 7.0  HGB 12.3 13.3  HCT 37 41.6  MCV  --  95.2  PLT 194 179   Lab Results  Component Value Date   TSH 1.03 08/13/2022   Lab Results  Component Value Date   HGBA1C 6.6 08/13/2022   Lab Results  Component Value Date   CHOL 192 03/19/2021   HDL 69 03/19/2021   LDLCALC 107 (H) 03/19/2021   LDLDIRECT 114.7 08/18/2011   TRIG 78 03/19/2021   CHOLHDL 2.8 03/19/2021    Significant Diagnostic Results in last 30 days:  No results found.  Assessment/Plan 1. DNR (do not resuscitate)     Family/ staff Communication:   Labs/tests ordered:   This encounter was created in error - please disregard.

## 2023-01-10 ENCOUNTER — Encounter: Payer: Self-pay | Admitting: Nurse Practitioner

## 2023-01-10 DIAGNOSIS — I5020 Unspecified systolic (congestive) heart failure: Secondary | ICD-10-CM | POA: Diagnosis not present

## 2023-01-10 DIAGNOSIS — J309 Allergic rhinitis, unspecified: Secondary | ICD-10-CM | POA: Diagnosis not present

## 2023-01-10 DIAGNOSIS — F02A4 Dementia in other diseases classified elsewhere, mild, with anxiety: Secondary | ICD-10-CM | POA: Diagnosis not present

## 2023-01-10 DIAGNOSIS — N1831 Chronic kidney disease, stage 3a: Secondary | ICD-10-CM | POA: Diagnosis not present

## 2023-01-10 DIAGNOSIS — H900 Conductive hearing loss, bilateral: Secondary | ICD-10-CM | POA: Diagnosis not present

## 2023-01-10 DIAGNOSIS — J449 Chronic obstructive pulmonary disease, unspecified: Secondary | ICD-10-CM | POA: Diagnosis not present

## 2023-01-11 ENCOUNTER — Encounter: Payer: Self-pay | Admitting: Nurse Practitioner

## 2023-01-11 ENCOUNTER — Non-Acute Institutional Stay (SKILLED_NURSING_FACILITY): Payer: Self-pay | Admitting: Nurse Practitioner

## 2023-01-11 DIAGNOSIS — I509 Heart failure, unspecified: Secondary | ICD-10-CM | POA: Diagnosis not present

## 2023-01-11 DIAGNOSIS — L03116 Cellulitis of left lower limb: Secondary | ICD-10-CM | POA: Diagnosis not present

## 2023-01-11 DIAGNOSIS — I5020 Unspecified systolic (congestive) heart failure: Secondary | ICD-10-CM | POA: Diagnosis not present

## 2023-01-11 DIAGNOSIS — M797 Fibromyalgia: Secondary | ICD-10-CM

## 2023-01-11 DIAGNOSIS — J449 Chronic obstructive pulmonary disease, unspecified: Secondary | ICD-10-CM | POA: Diagnosis not present

## 2023-01-11 DIAGNOSIS — J309 Allergic rhinitis, unspecified: Secondary | ICD-10-CM | POA: Diagnosis not present

## 2023-01-11 DIAGNOSIS — N1831 Chronic kidney disease, stage 3a: Secondary | ICD-10-CM | POA: Diagnosis not present

## 2023-01-11 DIAGNOSIS — H900 Conductive hearing loss, bilateral: Secondary | ICD-10-CM | POA: Diagnosis not present

## 2023-01-11 DIAGNOSIS — I10 Essential (primary) hypertension: Secondary | ICD-10-CM | POA: Diagnosis not present

## 2023-01-11 DIAGNOSIS — F02A4 Dementia in other diseases classified elsewhere, mild, with anxiety: Secondary | ICD-10-CM | POA: Diagnosis not present

## 2023-01-11 NOTE — Progress Notes (Signed)
Location:   SNF [facility] Room Number: 33 Place of Service:  SNF (31) Provider: Arna Snipe Yailyn Strack NP  Dorella Laster X, NP  Patient Care Team: Emaan Gary X, NP as PCP - General (Internal Medicine) Astin Rape X, NP as Nurse Practitioner (Internal Medicine)  Extended Emergency Contact Information Primary Emergency Contact: Mascari,Jeff Home Phone: [phone] Relation: Son Secondary Emergency Contact: Culbreth Sr.,Christopher Home Phone: [phone] Relation: Relative  Code Status: DNR Goals of care: Advanced Directive information    01/07/2023    1:30 PM  Advanced Directives  Does Patient Have a Medical Advance Directive? Yes  Type of Advance Directive Living will;Out of facility DNR (pink MOST or yellow form)  Does patient want to make changes to medical advance directive? No - Patient declined  Pre-existing out of facility DNR order (yellow form or pink MOST form) Yellow form placed in chart (order not valid for inpatient use)     Chief Complaint  Patient presents with   Acute Visit    Lateral left lower leg redness, warmth, swelling, and tenderness in the previous skin tear area    HPI:  Pt is a 89 y.o. female seen today for an acute visit for the lateral left lower leg redness, warmth, swelling, and tenderness in the previous skin tear area.   01/07/23 O2 desaturation Sat O2 in 80s with O2 2-3lpm via [place]. Chronic non productive cough at her baseline. CXR showed central pulmonary venous congestion w/o frank pulmonary edema, chronic lung disease, no acute findings of osseous structures. Afebrile. Morphine started per Hospice recommendation 01/06/23, better pain controlled the patient stated. The patient stated she breathes easier with increased Furosemide.                            Gait abnormality, walker for ambulation,  risk falling, last fall 12/28/22, resulted skin tear R elbow, R wrist, lateral lower left leg-healing nicely. C/o left sided rib cage tenderness, no bruise  noted, pain with movement.              CHF clinically presumed. 08/16/22 Echo mild aortic stenosis, normal left ventricular systolic function, no EF.  BNP in 364 08/12/22>>248 09/21/22, chronic edema BLE, on Furosemide.              COPD, intermittent chronic mild expiratory wheezes, c/o SOB, uses O2 more than prior,  takes Benzonatate, DuoNeb, prn Albuterol/Budsonide, cough is chronic, on Prednisone low dose. treated with higher dose of Prednisone for symptomatic control.  Hx of CVA, CT x2 03/2021 showed no acute findings except age indeterminate right corona radiator infarct, small vessel changes. Neurology recommended no further work ups.              Macular degeneration, f/u [facility]: OU for [place] MD involving central area. OS exudative age related MD             Chronic lower back, leg pain,  R shoulder pain, had Ortho eval, takes Lyrica, Tylenol, Norco.              HTN, off  Metoprolol.              Urinary symptoms, on  Nitrofurantoin 50mg  qd for UTI suppression, Dr. Vernie Ammons Hx of Estrace vaginal cream for atrophic vaginitis.              Anemia, Hx of Hgb 6.8 in hospital s/p 2 units of PRBC, pharm dc Vit B12, on  Iron, Hgb 13.3 07/31/22             CKD Bun/creat 20/1.1 11/09/22             GERD/chronic gastritis/atrophic gastritis, GI bleed 02/2020, s/p EGD, per biopsy, takes Protonix. F/u GI prn Fibromyalgia takes Morphine, lyrica             Tactile hallucinations, off meds. 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     Fibromyalgia    GERD 12/19/2006   Qualifier: Diagnosis of  By: Renaldo Fiddler CMA, Marliss Czar  04/13/16 TSH 1.20, Na 141, K 4.2, Bun 15, creat 0.79, wbc 5.5, Hgb 11.5, plt 283    GERD (gastroesophageal reflux disease)    Hammer toe of second toe of left foot 04/08/2016   Left 2nd   Hearing loss    Hemorrhoids    Hypercholesterolemia    Hypertension    Osteoarthritis 12/19/2006   Qualifier: Diagnosis of  By: Renaldo Fiddler CMA, Leigh     Osteoporosis    Past Surgical History:  Procedure Laterality Date   BIOPSY  02/17/2020   Procedure: BIOPSY;  Surgeon: Lynann Bologna, MD;  Location: [facility];  Service: Endoscopy;;   CATARACT EXTRACTION, BILATERAL  2009   COLONOSCOPY  2009, 2006 , 2017   ESOPHAGOGASTRODUODENOSCOPY (EGD) WITH PROPOFOL N/A 02/17/2020   Procedure: ESOPHAGOGASTRODUODENOSCOPY (EGD) WITH PROPOFOL;  Surgeon: Lynann Bologna, MD;  Location: [facility];  Service: Endoscopy;  Laterality: N/A;   IR ANGIOGRAM SELECTIVE EACH ADDITIONAL VESSEL  02/17/2020   IR ANGIOGRAM SELECTIVE EACH ADDITIONAL VESSEL  02/17/2020   IR ANGIOGRAM SELECTIVE EACH ADDITIONAL VESSEL  02/17/2020   IR ANGIOGRAM SELECTIVE EACH ADDITIONAL VESSEL  02/17/2020   IR ANGIOGRAM SELECTIVE EACH ADDITIONAL VESSEL  02/17/2020   IR ANGIOGRAM VISCERAL SELECTIVE  02/17/2020   IR ANGIOGRAM VISCERAL SELECTIVE  02/17/2020   IR US GUIDE VASC ACCESS RIGHT  02/17/2020   stapes surgery     Dr Dorma Russell   TONSILLECTOMY AND ADENOIDECTOMY     as a child    Allergies  Allergen Reactions   Penicillins Anaphylaxis   Bisphosphonates Other (See Comments)    [name]   Influenza Vaccines      Unknown   Simvastatin     Unknown  Trazodone And Nefazodone     Felt her throat was closing up/kept her awake   Sulfonamide Derivatives Rash and Other (See Comments)    blisters    Allergies as of 01/11/2023       Reactions   Penicillins Anaphylaxis   Bisphosphonates Other (See Comments)   [name]   Influenza Vaccines    Unknown   Simvastatin    Unknown    Trazodone And Nefazodone    Felt her throat was closing up/kept her awake   Sulfonamide Derivatives Rash, Other (See Comments)   blisters        Medication List        Accurate as of January 11, 2023  4:34 PM. If you have any questions, ask your nurse or doctor.          acetaminophen 325 MG tablet Commonly known as: TYLENOL Take 650 mg by mouth every 4 (four) hours as needed for fever.  Give 650 mg by mouth at bedtime   albuterol 108 (90 Base) MCG/ACT inhaler Commonly known as: VENTOLIN HFA Inhale into the lungs every 6 (six) hours as needed for wheezing or shortness of breath.   Albuterol-Budesonide 90-80 MCG/ACT Aero Inhale 2 puffs into the lungs every 6 (six) hours as needed (shortness of breath/ wheezing).   Antacid 750 MG chewable tablet Generic drug: calcium carbonate Chew 1 tablet by mouth daily.   benzonatate 100 MG capsule Commonly known as: TESSALON Take 100 mg by mouth 2 (two) times daily.   budesonide-formoterol 80-4.5 MCG/ACT inhaler Commonly known as: SYMBICORT Inhale 1 puff into the lungs daily.   cholecalciferol 1000 units tablet Commonly known as: VITAMIN D Take 1,000 Units by mouth daily.   cyanocobalamin 1000 MCG tablet Commonly known as: VITAMIN B12 Take 1,000 mcg by mouth daily.   docusate sodium 100 MG capsule Commonly known as: COLACE Take 100 mg by mouth 2 (two) times daily.   escitalopram 20 MG tablet Commonly known as: LEXAPRO Take 20 mg by mouth at bedtime.   ferrous sulfate 325 (65 FE) MG tablet Take 325 mg by mouth. Once A Day on Mon, Wed,  Fri   furosemide 20 MG tablet Commonly known as: LASIX Take 20 mg by mouth daily.   furosemide 40 MG tablet Commonly known as: LASIX Take 1 tablet (40 mg total) by mouth daily for 2 days.   HYDROcodone-acetaminophen 5-325 MG tablet Commonly known as: NORCO/VICODIN Take 1 tablet by mouth every 6 (six) hours as needed for moderate pain.   hydrocortisone cream 1 % Apply 1 application topically 2 (two) times daily as needed (wrists).   hydrocortisone valerate cream 0.2 % Commonly known as: WESTCORT Apply 1 application topically 2 (two) times daily as needed (for itching). Cleanse behind ears with gentle cleanser and dry completely.   ipratropium-albuterol 0.5-2.5 (3) MG/3ML Soln Commonly known as: DUONEB Take 3 mLs by nebulization daily.   ipratropium-albuterol 0.5-2.5 (3) MG/3ML Soln Commonly known as: DUONEB Take 3 mLs by nebulization in the morning and at bedtime. 1 vial inhale orally every 24 hours as needed for Wheezing   ketoconazole 2 % cream Commonly known as: NIZORAL Apply 1 application  topically 2 (two) times daily as needed for irritation.   LORazepam 0.5 MG tablet Commonly known as: ATIVAN Take 0.5 mg by mouth every 4 (four) hours as needed for anxiety.   methocarbamol 500 MG tablet Commonly known as: ROBAXIN Take 500 mg by mouth every 6 (six) hours as needed for muscle  spasms.   nitrofurantoin 50 MG capsule Commonly known as: MACRODANTIN Take 50 mg by mouth at bedtime.   Olopatadine HCl 0.2 % Soln Place 1 drop into both eyes daily.   polyethylene glycol 17 g packet Commonly known as: MIRALAX / GLYCOLAX Take 17 g by mouth 2 (two) times a week. Sunday and Wednesday   potassium chloride 20 MEQ packet Commonly known as: KLOR-CON Take 20 mEq by mouth 2 (two) times daily.   predniSONE 5 MG tablet Commonly known as: DELTASONE Take 5 mg by mouth daily with breakfast.   pregabalin 75 MG capsule Commonly known as: LYRICA Take 1 capsule (75 mg total) by  mouth at bedtime.   Sarna Sensitive 1 % Lotn Generic drug: pramoxine Apply to cheeks topically one time a day for rosy burning cheeks   Systane Complete 0.6 % Soln Generic drug: Propylene Glycol Instill 1 drop in both eyes four times a day for DRY EYE   Voltaren 1 % Gel Generic drug: diclofenac Sodium Apply 2 g topically every 8 (eight) hours as needed.   zolpidem 5 MG tablet Commonly known as: Ambien Take 1 tablet (5 mg total) by mouth at bedtime.        Review of Systems  Constitutional:  Positive for fatigue. Negative for appetite change and fever.  HENT:  Positive for hearing loss. Negative for congestion and voice change.   Eyes:  Negative for visual disturbance.       C/o blurred vision, ? Color, ? Right eye peripheral vision loss-f/u Ophthalmology  Respiratory:  Positive for cough and shortness of breath. Negative for chest tightness and wheezing.        DOE, chronic hacking cough  Cardiovascular:  Positive for leg swelling. Negative for chest pain and palpitations.  Gastrointestinal:  Negative for abdominal pain and constipation.  Genitourinary:  Negative for dysuria and urgency.       Chronic, on and off  Musculoskeletal:  Positive for arthralgias, back pain, gait problem and myalgias. Negative for joint swelling.       New mid back pain, mostly in the muscle next to thoracic spine palpated.   Skin:  Positive for wound.  Neurological:  Negative for speech difficulty, weakness and light-headedness.  Psychiatric/Behavioral:  Negative for behavioral problems and hallucinations.        Chronic going to bed late, sleeping in late.     Immunization History  Administered Date(s) Administered   Moderna SARS-COV2 Booster Vaccination 12/25/2019   Moderna Sars-Covid-2 Vaccination 02/17/2019, 03/17/2019   Pneumococcal Conjugate-13 09/01/2015   Pneumococcal Polysaccharide-23 07/07/1998   Td 08/21/2009   Tdap 09/22/2022   Pertinent  Health Maintenance Due  Topic Date Due    DEXA SCAN  Completed   INFLUENZA VACCINE  Discontinued      03/20/2021   12:00 AM 03/20/2021   12:00 PM 02/05/2022    9:16 AM 03/09/2022   10:01 AM 07/23/2022    3:44 PM  Fall Risk  Falls in the past year?   0 0 0  Was there an injury with Fall?   0 0 0  Fall Risk Category Calculator   0 0 0  Fall Risk Category (Retired)   Low    (RETIRED) Patient Fall Risk Level High fall risk High fall risk High fall risk    Patient at Risk for Falls Due to   History of fall(s) No Fall Risks No Fall Risks  Fall risk Follow up   Falls evaluation completed Falls evaluation completed  Falls evaluation completed   Functional Status Survey:    Vitals:   01/11/23 1622  BP: (!) 133/41  Pulse: 86  Resp: 20  Temp: (!) 97.4 F (36.3 C)  SpO2: (!) 86%  Weight: 160 lb 14.4 oz (73 kg)   Body mass index is 31.42 kg/m. Physical Exam Vitals and nursing note reviewed.  Constitutional:      Comments: Fatigue  HENT:     Head: Normocephalic and atraumatic.     Nose: Nose normal.     Mouth/Throat:     Mouth: Mucous membranes are moist.  Eyes:     Extraocular Movements: Extraocular movements intact.     Conjunctiva/sclera: Conjunctivae normal.     Pupils: Pupils are equal, round, and reactive to light.     Comments: Able to count my fingers 3 feet away from her eyes.   Cardiovascular:     Rate and Rhythm: Normal rate and regular rhythm.     Heart sounds: No murmur heard. Pulmonary:     Effort: Pulmonary effort is normal.     Breath sounds: Rales present. No wheezing or rhonchi.     Comments: Decreased air entry to both lungs. Needs O2 via [place] to maintain SatO2>90%. Moist rales bibasilar.  Chest:     Chest wall: No tenderness.  Abdominal:     General: Bowel sounds are normal.     Palpations: Abdomen is soft.     Tenderness: There is no abdominal tenderness.  Musculoskeletal:        General: Tenderness present.     Cervical back: Normal range of motion and neck supple.     Right lower leg: Edema  present.     Left lower leg: Edema present.     Comments: mild edema BLE. R shoulder pain with overhead ROM.    Skin:    General: Skin is warm and dry.     Findings: Erythema present.     Comments: Skin tear right elbow, right wrist, healing nicely.   the lateral left lower leg redness, warmth, swelling, and tenderness in the previous skin tear area.    Neurological:     General: No focal deficit present.     Mental Status: She is alert. Mental status is at baseline.     Motor: No weakness.     Coordination: Coordination normal.     Gait: Gait abnormal.     Comments: Oriented to person, place.   Psychiatric:        Mood and Affect: Mood normal.        Behavior: Behavior normal.     Labs reviewed: Recent Labs    07/31/22 1448 08/13/22 0000 08/24/22 0000 11/09/22 0000  NA 137 142 141 139  K 3.6 3.1* 4.1 3.7  CL 100 101 103 98*  CO2 27 29* 31* 32*  GLUCOSE 112*  --   --   --   BUN 16 22* 18 20  CREATININE 1.25* 1.2* 1.1 1.1  CALCIUM 9.1 8.8 8.8 9.0   Recent Labs    07/31/22 1448  AST 20  ALT 19  ALKPHOS 78  BILITOT 1.2  PROT 6.1*  ALBUMIN 3.6   Recent Labs    07/27/22 0000 07/31/22 1448  WBC 8.1 7.0  HGB 12.3 13.3  HCT 37 41.6  MCV  --  95.2  PLT 194 179   Lab Results  Component Value Date   TSH 1.03 08/13/2022   Lab Results  Component Value Date  HGBA1C 6.6 08/13/2022   Lab Results  Component Value Date   CHOL 192 03/19/2021   HDL 69 03/19/2021   LDLCALC 107 (H) 03/19/2021   LDLDIRECT 114.7 08/18/2011   TRIG 78 03/19/2021   CHOLHDL 2.8 03/19/2021    Significant Diagnostic Results in last 30 days:  No results found.  Assessment/Plan: Cellulitis of left lower leg  the lateral left lower leg redness, warmth, swelling, and tenderness in the previous skin tear area.  Doxycycline 100mg  bid x 7 days.   Congestive heart failure (CHF) (HCC) CXR showed pulmonary venous congestion, elevated BNP 200-300s, edema in legs, O2 desaturation Will  increase Furosemide to 40mg  bid, improved clinically after initial 40mg  bid x 2 days. BMP one week  HTN (hypertension) Runs low presently, on taking furosemide. Off Metoprolol.   Fibromyalgia takes Morphine, lyrica    Family/ staff Communication: plan of care reviewed with the patient and charge nurse.   Labs/tests ordered:  none  Time spend 30 minutes.

## 2023-01-11 NOTE — Assessment & Plan Note (Signed)
Runs low presently, on taking furosemide. Off Metoprolol.

## 2023-01-11 NOTE — Assessment & Plan Note (Signed)
the lateral left lower leg redness, warmth, swelling, and tenderness in the previous skin tear area.  Doxycycline 100mg  bid x 7 days.

## 2023-01-11 NOTE — Assessment & Plan Note (Signed)
takes Morphine, lyrica

## 2023-01-11 NOTE — Assessment & Plan Note (Signed)
CXR showed pulmonary venous congestion, elevated BNP 200-300s, edema in legs, O2 desaturation Will increase Furosemide to 40mg  bid, improved clinically after initial 40mg  bid x 2 days. BMP one week

## 2023-01-14 DIAGNOSIS — J309 Allergic rhinitis, unspecified: Secondary | ICD-10-CM | POA: Diagnosis not present

## 2023-01-14 DIAGNOSIS — H900 Conductive hearing loss, bilateral: Secondary | ICD-10-CM | POA: Diagnosis not present

## 2023-01-14 DIAGNOSIS — I5020 Unspecified systolic (congestive) heart failure: Secondary | ICD-10-CM | POA: Diagnosis not present

## 2023-01-14 DIAGNOSIS — F02A4 Dementia in other diseases classified elsewhere, mild, with anxiety: Secondary | ICD-10-CM | POA: Diagnosis not present

## 2023-01-14 DIAGNOSIS — J449 Chronic obstructive pulmonary disease, unspecified: Secondary | ICD-10-CM | POA: Diagnosis not present

## 2023-01-14 DIAGNOSIS — N1831 Chronic kidney disease, stage 3a: Secondary | ICD-10-CM | POA: Diagnosis not present

## 2023-01-16 DIAGNOSIS — J42 Unspecified chronic bronchitis: Secondary | ICD-10-CM | POA: Diagnosis not present

## 2023-01-16 DIAGNOSIS — G4700 Insomnia, unspecified: Secondary | ICD-10-CM | POA: Diagnosis not present

## 2023-01-16 DIAGNOSIS — H900 Conductive hearing loss, bilateral: Secondary | ICD-10-CM | POA: Diagnosis not present

## 2023-01-16 DIAGNOSIS — K219 Gastro-esophageal reflux disease without esophagitis: Secondary | ICD-10-CM | POA: Diagnosis not present

## 2023-01-16 DIAGNOSIS — R12 Heartburn: Secondary | ICD-10-CM | POA: Diagnosis not present

## 2023-01-16 DIAGNOSIS — F02A4 Dementia in other diseases classified elsewhere, mild, with anxiety: Secondary | ICD-10-CM | POA: Diagnosis not present

## 2023-01-16 DIAGNOSIS — I5020 Unspecified systolic (congestive) heart failure: Secondary | ICD-10-CM | POA: Diagnosis not present

## 2023-01-16 DIAGNOSIS — D649 Anemia, unspecified: Secondary | ICD-10-CM | POA: Diagnosis not present

## 2023-01-16 DIAGNOSIS — J309 Allergic rhinitis, unspecified: Secondary | ICD-10-CM | POA: Diagnosis not present

## 2023-01-16 DIAGNOSIS — N1831 Chronic kidney disease, stage 3a: Secondary | ICD-10-CM | POA: Diagnosis not present

## 2023-01-16 DIAGNOSIS — J449 Chronic obstructive pulmonary disease, unspecified: Secondary | ICD-10-CM | POA: Diagnosis not present

## 2023-01-16 DIAGNOSIS — F32A Depression, unspecified: Secondary | ICD-10-CM | POA: Diagnosis not present

## 2023-01-17 ENCOUNTER — Non-Acute Institutional Stay (SKILLED_NURSING_FACILITY): Payer: Self-pay | Admitting: Nurse Practitioner

## 2023-01-17 DIAGNOSIS — D649 Anemia, unspecified: Secondary | ICD-10-CM

## 2023-01-17 DIAGNOSIS — F419 Anxiety disorder, unspecified: Secondary | ICD-10-CM

## 2023-01-17 DIAGNOSIS — F02A4 Dementia in other diseases classified elsewhere, mild, with anxiety: Secondary | ICD-10-CM | POA: Diagnosis not present

## 2023-01-17 DIAGNOSIS — R300 Dysuria: Secondary | ICD-10-CM | POA: Diagnosis not present

## 2023-01-17 DIAGNOSIS — L03116 Cellulitis of left lower limb: Secondary | ICD-10-CM

## 2023-01-17 DIAGNOSIS — K219 Gastro-esophageal reflux disease without esophagitis: Secondary | ICD-10-CM

## 2023-01-17 DIAGNOSIS — F5105 Insomnia due to other mental disorder: Secondary | ICD-10-CM

## 2023-01-17 DIAGNOSIS — N1831 Chronic kidney disease, stage 3a: Secondary | ICD-10-CM | POA: Diagnosis not present

## 2023-01-17 DIAGNOSIS — J449 Chronic obstructive pulmonary disease, unspecified: Secondary | ICD-10-CM | POA: Diagnosis not present

## 2023-01-17 DIAGNOSIS — I509 Heart failure, unspecified: Secondary | ICD-10-CM | POA: Diagnosis not present

## 2023-01-17 DIAGNOSIS — M797 Fibromyalgia: Secondary | ICD-10-CM | POA: Diagnosis not present

## 2023-01-17 DIAGNOSIS — J4489 Other specified chronic obstructive pulmonary disease: Secondary | ICD-10-CM | POA: Diagnosis not present

## 2023-01-17 DIAGNOSIS — I10 Essential (primary) hypertension: Secondary | ICD-10-CM | POA: Diagnosis not present

## 2023-01-17 DIAGNOSIS — J309 Allergic rhinitis, unspecified: Secondary | ICD-10-CM | POA: Diagnosis not present

## 2023-01-17 DIAGNOSIS — H900 Conductive hearing loss, bilateral: Secondary | ICD-10-CM | POA: Diagnosis not present

## 2023-01-17 DIAGNOSIS — I5020 Unspecified systolic (congestive) heart failure: Secondary | ICD-10-CM | POA: Diagnosis not present

## 2023-01-18 ENCOUNTER — Encounter: Payer: Self-pay | Admitting: Nurse Practitioner

## 2023-01-18 DIAGNOSIS — F02A4 Dementia in other diseases classified elsewhere, mild, with anxiety: Secondary | ICD-10-CM | POA: Diagnosis not present

## 2023-01-18 DIAGNOSIS — H900 Conductive hearing loss, bilateral: Secondary | ICD-10-CM | POA: Diagnosis not present

## 2023-01-18 DIAGNOSIS — I5020 Unspecified systolic (congestive) heart failure: Secondary | ICD-10-CM | POA: Diagnosis not present

## 2023-01-18 DIAGNOSIS — J449 Chronic obstructive pulmonary disease, unspecified: Secondary | ICD-10-CM | POA: Diagnosis not present

## 2023-01-18 DIAGNOSIS — I10 Essential (primary) hypertension: Secondary | ICD-10-CM | POA: Diagnosis not present

## 2023-01-18 DIAGNOSIS — J309 Allergic rhinitis, unspecified: Secondary | ICD-10-CM | POA: Diagnosis not present

## 2023-01-18 DIAGNOSIS — N1831 Chronic kidney disease, stage 3a: Secondary | ICD-10-CM | POA: Diagnosis not present

## 2023-01-18 NOTE — Assessment & Plan Note (Signed)
Bun/creat 17/1.15 01/11/23, update CMP/eGFR

## 2023-01-18 NOTE — Assessment & Plan Note (Signed)
on  Nitrofurantoin 50mg  qd for UTI suppression, Dr. Vernie Ammons Hx of Estrace vaginal cream for atrophic vaginitis.

## 2023-01-18 NOTE — Assessment & Plan Note (Signed)
GERD/chronic gastritis/atrophic gastritis, GI bleed 02/2020, s/p EGD, per biopsy, takes PPI,  F/u GI prn

## 2023-01-18 NOTE — Assessment & Plan Note (Signed)
Loose Bp control, only taking Furosemide.

## 2023-01-18 NOTE — Progress Notes (Signed)
Location:   SNF [facility] Room Number: 60A Place of Service:  SNF (31) Provider: Arna Snipe Alida Greiner NP  Makyah Lavigne X, NP  Patient Care Team: Annais Crafts X, NP as PCP - General (Internal Medicine) Janelis Stelzer X, NP as Nurse Practitioner (Internal Medicine)  Extended Emergency Contact Information Primary Emergency Contact: [place]nemus,Jeff Home Phone: [phone] Relation: Son Secondary Emergency Contact: Culbreth Sr.,Christopher Home Phone: [phone] Relation: Relative  Code Status:  DNR Goals of care: Advanced Directive information    01/07/2023    1:30 PM  Advanced Directives  Does Patient Have a Medical Advance Directive? Yes  Type of Advance Directive Living will;Out of facility DNR (pink MOST or yellow form)  Does patient want to make changes to medical advance directive? No - Patient declined  Pre-existing out of facility DNR order (yellow form or pink MOST form) Yellow form placed in chart (order not valid for inpatient use)     Chief Complaint  Patient presents with   Medical Management of Chronic Issues    HPI:  Pt is a 89 y.o. female seen today for medical management of chronic diseases.      The lateral left lower leg redness, warmth, swelling, and tenderness in the previous skin tear area, near resolution, completed 7 day course of Doxy along with increased Furosemide.              Improved 01/07/23 O2 desaturation Sat O2 in 80s with O2 2-3lpm via [place]. Chronic non productive cough at her baseline. CXR showed central pulmonary venous congestion w/o frank pulmonary edema, chronic lung disease, no acute findings of osseous structures. Afebrile. Morphine started per Hospice recommendation 01/06/23, better pain controlled the patient stated. The patient stated she breathes easier with increased Furosemide.                            Gait abnormality, walker for ambulation,  risk falling, last fall 12/28/22, resulted skin tear R elbow, R wrist, lateral lower left leg-healing  nicely.              CHF clinically presumed. 08/16/22 Echo mild aortic stenosis, normal left ventricular systolic function, no EF.  BNP in 364 08/12/22>>248 09/21/22, chronic edema BLE, on Furosemide.              COPD, intermittent chronic mild expiratory wheezes, c/o SOB, uses O2,  takes Benzonatate, DuoNeb, prn Albuterol/Budsonide, cough is chronic, on Prednisone low dose. treated with higher dose of Prednisone for symptomatic control.  Hx of CVA, CT x2 03/2021 showed no acute findings except age indeterminate right corona radiator infarct, small vessel changes. Neurology recommended no further work ups.              Macular degeneration, f/u [facility]: OU for [place] MD involving central area. OS exudative age related MD             Chronic lower back, leg pain,  R shoulder pain, had Ortho eval, takes Lyrica, Tylenol, Norco.              HTN, off  Metoprolol.              Urinary symptoms, on  Nitrofurantoin 50mg  qd for UTI suppression, Dr. Vernie Ammons Hx of Estrace vaginal cream for atrophic vaginitis.              Anemia, Hx of Hgb 6.8 in hospital s/p 2 units of PRBC, pharm dc Vit B12,  on Iron, Hgb 9.8 01/11/23             CKD Bun/creat 17/1.15 01/11/23             GERD/chronic gastritis/atrophic gastritis, GI bleed 02/2020, s/p EGD, per biopsy, takes PPI,  F/u GI prn Fibromyalgia takes Morphine, lyrica             Tactile hallucinations, off meds. 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     Fibromyalgia    GERD 12/19/2006   Qualifier: Diagnosis of  By: Renaldo Fiddler CMA, Marliss Czar  04/13/16 TSH 1.20, Na 141, K 4.2, Bun 15, creat 0.79, wbc 5.5, Hgb 11.5, plt 283    GERD (gastroesophageal reflux disease)    Hammer toe of second toe of left foot 04/08/2016   Left 2nd   Hearing loss    Hemorrhoids    Hypercholesterolemia    Hypertension    Osteoarthritis 12/19/2006   Qualifier: Diagnosis of  By: Renaldo Fiddler CMA, Leigh     Osteoporosis    Past Surgical History:  Procedure Laterality Date   BIOPSY  02/17/2020   Procedure: BIOPSY;  Surgeon: Lynann Bologna, MD;  Location: [facility];  Service: Endoscopy;;   CATARACT EXTRACTION, BILATERAL  2009   COLONOSCOPY  2009, 2006 , 2017   ESOPHAGOGASTRODUODENOSCOPY (EGD) WITH PROPOFOL N/A 02/17/2020   Procedure: ESOPHAGOGASTRODUODENOSCOPY (EGD) WITH PROPOFOL;  Surgeon: Lynann Bologna, MD;  Location: [facility];  Service: Endoscopy;  Laterality: N/A;   IR ANGIOGRAM SELECTIVE EACH ADDITIONAL VESSEL  02/17/2020   IR ANGIOGRAM SELECTIVE EACH ADDITIONAL VESSEL  02/17/2020   IR ANGIOGRAM SELECTIVE EACH ADDITIONAL VESSEL  02/17/2020   IR ANGIOGRAM SELECTIVE EACH ADDITIONAL VESSEL  02/17/2020   IR ANGIOGRAM SELECTIVE EACH ADDITIONAL VESSEL  02/17/2020   IR ANGIOGRAM VISCERAL SELECTIVE  02/17/2020   IR ANGIOGRAM VISCERAL SELECTIVE  02/17/2020   IR US GUIDE VASC ACCESS RIGHT  02/17/2020   stapes surgery     Dr Dorma Russell   TONSILLECTOMY AND ADENOIDECTOMY     as a child    Allergies  Allergen Reactions   Penicillins Anaphylaxis   Bisphosphonates Other (See Comments)    [name]   Influenza Vaccines     Unknown   Simvastatin      Unknown  Trazodone And Nefazodone     Felt her throat was closing up/kept her awake   Sulfonamide Derivatives Rash and Other (See Comments)    blisters    Allergies as of 01/17/2023       Reactions   Penicillins Anaphylaxis   Bisphosphonates Other (See Comments)   [name]   Influenza Vaccines    Unknown   Simvastatin    Unknown    Trazodone And Nefazodone    Felt her throat was closing up/kept her awake   Sulfonamide Derivatives Rash, Other (See Comments)   blisters        Medication List        Accurate as of January 17, 2023 11:59 PM. If you have any questions, ask your nurse or doctor.          acetaminophen 325 MG tablet Commonly known as: TYLENOL Take 650 mg by mouth every 4 (four) hours as needed for fever.  Give 650 mg by mouth at bedtime   albuterol 108 (90 Base) MCG/ACT inhaler Commonly known as: VENTOLIN HFA Inhale into the lungs every 6 (six) hours as needed for wheezing or shortness of breath.   Albuterol-Budesonide 90-80 MCG/ACT Aero Inhale 2 puffs into the lungs every 6 (six) hours as needed (shortness of breath/ wheezing).   Antacid 750 MG chewable tablet Generic drug: calcium carbonate Chew 1 tablet by mouth daily.   benzonatate 100 MG capsule Commonly known as: TESSALON Take 100 mg by mouth 2 (two) times daily.   budesonide-formoterol 80-4.5 MCG/ACT inhaler Commonly known as: SYMBICORT Inhale 1 puff into the lungs daily.   cholecalciferol 1000 units tablet Commonly known as: VITAMIN D Take 1,000 Units by mouth daily.   cyanocobalamin 1000 MCG tablet Commonly known as: VITAMIN B12 Take 1,000 mcg by mouth daily.   docusate sodium 100 MG capsule Commonly known as: COLACE Take 100 mg by mouth 2 (two) times daily.   escitalopram 20 MG tablet Commonly known as: LEXAPRO Take 20 mg by mouth at bedtime.   ferrous sulfate 325 (65 FE) MG tablet Take 325 mg by mouth. Once A Day on Mon, Wed, Fri   furosemide 20 MG  tablet Commonly known as: LASIX Take 20 mg by mouth daily.   HYDROcodone-acetaminophen 5-325 MG tablet Commonly known as: NORCO/VICODIN Take 1 tablet by mouth every 6 (six) hours as needed for moderate pain.   hydrocortisone cream 1 % Apply 1 application topically 2 (two) times daily as needed (wrists).   hydrocortisone valerate cream 0.2 % Commonly known as: WESTCORT Apply 1 application topically 2 (two) times daily as needed (for itching). Cleanse behind ears with gentle cleanser and dry completely.   ipratropium-albuterol 0.5-2.5 (3) MG/3ML Soln Commonly known as: DUONEB Take 3 mLs by nebulization daily.   ipratropium-albuterol 0.5-2.5 (3) MG/3ML Soln Commonly known as: DUONEB Take 3 mLs by nebulization in the morning and at bedtime. 1 vial inhale orally every 24 hours as needed for Wheezing   ketoconazole 2 % cream Commonly known as: NIZORAL Apply 1 application  topically 2 (two) times daily as needed for irritation.   LORazepam 0.5 MG tablet Commonly known as: ATIVAN Take 0.5 mg by mouth every 4 (four) hours as needed for anxiety.   methocarbamol 500 MG tablet Commonly known as: ROBAXIN Take 500 mg by mouth every 6 (six) hours as needed for muscle spasms.   nitrofurantoin 50 MG capsule Commonly known as: MACRODANTIN Take 50 mg by mouth at bedtime.   Olopatadine HCl 0.2 %  Soln Place 1 drop into both eyes daily.   polyethylene glycol 17 g packet Commonly known as: MIRALAX / GLYCOLAX Take 17 g by mouth 2 (two) times a week. Sunday and Wednesday   potassium chloride 20 MEQ packet Commonly known as: KLOR-CON Take 20 mEq by mouth 2 (two) times daily.   predniSONE 5 MG tablet Commonly known as: DELTASONE Take 5 mg by mouth daily with breakfast.   pregabalin 75 MG capsule Commonly known as: LYRICA Take 1 capsule (75 mg total) by mouth at bedtime.   Sarna Sensitive 1 % Lotn Generic drug: pramoxine Apply to cheeks topically one time a day for rosy burning  cheeks   Systane Complete 0.6 % Soln Generic drug: Propylene Glycol Instill 1 drop in both eyes four times a day for DRY EYE   Voltaren 1 % Gel Generic drug: diclofenac Sodium Apply 2 g topically every 8 (eight) hours as needed.   zolpidem 5 MG tablet Commonly known as: Ambien Take 1 tablet (5 mg total) by mouth at bedtime.        Review of Systems  Constitutional:  Negative for appetite change, fatigue and fever.  HENT:  Positive for hearing loss. Negative for congestion and voice change.   Eyes:  Negative for visual disturbance.       C/o blurred vision, ? Color, ? Right eye peripheral vision loss-f/u Ophthalmology  Respiratory:  Positive for cough and shortness of breath. Negative for chest tightness and wheezing.        DOE, chronic hacking cough  Cardiovascular:  Positive for leg swelling. Negative for chest pain and palpitations.  Gastrointestinal:  Negative for abdominal pain and constipation.  Genitourinary:  Negative for dysuria and urgency.       Chronic, on and off  Musculoskeletal:  Positive for arthralgias, back pain, gait problem and myalgias. Negative for joint swelling.       New mid back pain, mostly in the muscle next to thoracic spine palpated.   Skin:  Positive for wound.  Neurological:  Negative for speech difficulty, weakness and light-headedness.  Psychiatric/Behavioral:  Negative for behavioral problems and hallucinations.        Chronic going to bed late, sleeping in late.     Immunization History  Administered Date(s) Administered   Moderna SARS-COV2 Booster Vaccination 12/25/2019   Moderna Sars-Covid-2 Vaccination 02/17/2019, 03/17/2019   Pneumococcal Conjugate-13 09/01/2015   Pneumococcal Polysaccharide-23 07/07/1998   Td 08/21/2009   Tdap 09/22/2022   Pertinent  Health Maintenance Due  Topic Date Due   DEXA SCAN  Completed   INFLUENZA VACCINE  Discontinued      03/20/2021   12:00 AM 03/20/2021   12:00 PM 02/05/2022    9:16 AM 03/09/2022    10:01 AM 07/23/2022    3:44 PM  Fall Risk  Falls in the past year?   0 0 0  Was there an injury with Fall?   0 0 0  Fall Risk Category Calculator   0 0 0  Fall Risk Category (Retired)   Low    (RETIRED) Patient Fall Risk Level High fall risk High fall risk High fall risk    Patient at Risk for Falls Due to   History of fall(s) No Fall Risks No Fall Risks  Fall risk Follow up   Falls evaluation completed Falls evaluation completed Falls evaluation completed   Functional Status Survey:    Vitals:   01/17/23 1208  BP: (!) 148/60  Pulse: 63  Resp: 14  Temp: 97.7 F (36.5 C)  SpO2: 95%  Weight: 160 lb 3.2 oz (72.7 kg)   Body mass index is 31.29 kg/m. Physical Exam Vitals and nursing note reviewed.  Constitutional:      Appearance: Normal appearance.  HENT:     Head: Normocephalic and atraumatic.     Nose: Nose normal.     Mouth/Throat:     Mouth: Mucous membranes are moist.  Eyes:     Extraocular Movements: Extraocular movements intact.     Conjunctiva/sclera: Conjunctivae normal.     Pupils: Pupils are equal, round, and reactive to light.     Comments: Able to count my fingers 3 feet away from her eyes.   Cardiovascular:     Rate and Rhythm: Normal rate and regular rhythm.     Heart sounds: No murmur heard. Pulmonary:     Effort: Pulmonary effort is normal.     Breath sounds: Rales present. No wheezing or rhonchi.     Comments: Decreased air entry to both lungs. Needs O2 via [place] to maintain SatO2>90, rales bibasilar.  Chest:     Chest wall: No tenderness.  Abdominal:     General: Bowel sounds are normal.     Palpations: Abdomen is soft.     Tenderness: There is no abdominal tenderness.  Musculoskeletal:        General: Tenderness present.     Cervical back: Normal range of motion and neck supple.     Right lower leg: Edema present.     Left lower leg: Edema present.     Comments: mild edema BLE. R shoulder pain with overhead ROM.    Skin:    General: Skin is  warm and dry.     Findings: Erythema present.     Comments: Skin tear right elbow, right wrist, healing nicely.   the lateral left lower leg redness, warmth, swelling, and tenderness in the previous skin tear area, near healed.    Neurological:     General: No focal deficit present.     Mental Status: She is alert. Mental status is at baseline.     Motor: No weakness.     Coordination: Coordination normal.     Gait: Gait abnormal.     Comments: Oriented to person, place.   Psychiatric:        Mood and Affect: Mood normal.        Behavior: Behavior normal.     Labs reviewed: Recent Labs    07/31/22 1448 08/13/22 0000 08/24/22 0000 11/09/22 0000  NA 137 142 141 139  K 3.6 3.1* 4.1 3.7  CL 100 101 103 98*  CO2 27 29* 31* 32*  GLUCOSE 112*  --   --   --   BUN 16 22* 18 20  CREATININE 1.25* 1.2* 1.1 1.1  CALCIUM 9.1 8.8 8.8 9.0   Recent Labs    07/31/22 1448  AST 20  ALT 19  ALKPHOS 78  BILITOT 1.2  PROT 6.1*  ALBUMIN 3.6   Recent Labs    07/27/22 0000 07/31/22 1448  WBC 8.1 7.0  HGB 12.3 13.3  HCT 37 41.6  MCV  --  95.2  PLT 194 179   Lab Results  Component Value Date   TSH 1.03 08/13/2022   Lab Results  Component Value Date   HGBA1C 6.6 08/13/2022   Lab Results  Component Value Date   CHOL 192 03/19/2021   HDL 69 03/19/2021   LDLCALC 107 (H) 03/19/2021  LDLDIRECT 114.7 08/18/2011   TRIG 78 03/19/2021   CHOLHDL 2.8 03/19/2021    Significant Diagnostic Results in last 30 days:  No results found.  Assessment/Plan  Congestive heart failure (CHF) (HCC) clinically presumed and compensated,  08/16/22 Echo mild aortic stenosis, normal left ventricular systolic function, no EF.  BNP in 364 08/12/22>>248 09/21/22, chronic edema BLE, on Furosemide.  Cellulitis of left lower leg The lateral left lower leg redness, warmth, swelling, and tenderness in the previous skin tear area, near resolution, completed 7 day course of Doxy along with increased  Furosemide.   Chronic anemia Hx of Hgb 6.8 in hospital s/p 2 units of PRBC, pharm dc Vit B12, on Iron, Hgb 9.8 01/11/23 01/18/23 obtain Iron, ferritin, Vit B12, Folate, CBC/diff  CKD (chronic kidney disease) stage 3, GFR 30-59 ml/min (HCC) Bun/creat 17/1.15 01/11/23, update CMP/eGFR  GERD  GERD/chronic gastritis/atrophic gastritis, GI bleed 02/2020, s/p EGD, per biopsy, takes PPI,  F/u GI prn  Fibromyalgia takes Morphine, lyrica for pain  Insomnia secondary to anxiety Stable,  takes Ambien, Lexapro    Dysuria  on  Nitrofurantoin 50mg  qd for UTI suppression, Dr. Vernie Ammons Hx of Estrace vaginal cream for atrophic vaginitis.   HTN (hypertension) Loose Bp control, only taking Furosemide.    Family/ staff Communication: plan of care reviewed with the patient and charge nurse.   Labs/tests ordered: CBC/diff, CMP/eGFR, Ferritin, Iron, Vit b12, Folate.   Time spend 30 minutes.

## 2023-01-18 NOTE — Assessment & Plan Note (Signed)
intermittent chronic mild expiratory wheezes, c/o SOB, uses O2,  takes Benzonatate, DuoNeb, prn Albuterol/Budsonide, cough is chronic, on Prednisone low dose. treated with higher dose of Prednisone for symptomatic control.

## 2023-01-18 NOTE — Assessment & Plan Note (Signed)
The lateral left lower leg redness, warmth, swelling, and tenderness in the previous skin tear area, near resolution, completed 7 day course of Doxy along with increased Furosemide.

## 2023-01-18 NOTE — Assessment & Plan Note (Signed)
takes Morphine, lyrica for pain

## 2023-01-18 NOTE — Assessment & Plan Note (Signed)
Hx of Hgb 6.8 in hospital s/p 2 units of PRBC, pharm dc Vit B12, on Iron, Hgb 9.8 01/11/23 01/18/23 obtain Iron, ferritin, Vit B12, Folate, CBC/diff

## 2023-01-18 NOTE — Assessment & Plan Note (Signed)
Stable,  takes Ambien, Lexapro

## 2023-01-18 NOTE — Assessment & Plan Note (Signed)
clinically presumed and compensated,  08/16/22 Echo mild aortic stenosis, normal left ventricular systolic function, no EF.  BNP in 364 08/12/22>>248 09/21/22, chronic edema BLE, on Furosemide.

## 2023-01-20 DIAGNOSIS — I10 Essential (primary) hypertension: Secondary | ICD-10-CM | POA: Diagnosis not present

## 2023-01-20 DIAGNOSIS — D519 Vitamin B12 deficiency anemia, unspecified: Secondary | ICD-10-CM | POA: Diagnosis not present

## 2023-01-20 LAB — BASIC METABOLIC PANEL
BUN: 19 (ref 4–21)
CO2: 33 — AB (ref 13–22)
Chloride: 98 — AB (ref 99–108)
Creatinine: 1.1 (ref 0.5–1.1)
Glucose: 128
Potassium: 3.6 meq/L (ref 3.5–5.1)
Sodium: 139 (ref 137–147)

## 2023-01-20 LAB — HEPATIC FUNCTION PANEL
ALT: 12 U/L (ref 7–35)
AST: 12 — AB (ref 13–35)
Alkaline Phosphatase: 117 (ref 25–125)
Bilirubin, Total: 0.6

## 2023-01-20 LAB — IRON,TIBC AND FERRITIN PANEL
%SAT: 32
Ferritin: 203
Iron: 63
TIBC: 199

## 2023-01-20 LAB — CBC: RBC: 3.59 — AB (ref 3.87–5.11)

## 2023-01-20 LAB — CBC AND DIFFERENTIAL
HCT: 33 — AB (ref 36–46)
Hemoglobin: 10.5 — AB (ref 12.0–16.0)
Neutrophils Absolute: 5588
Platelets: 218 10*3/uL (ref 150–400)
WBC: 7.5

## 2023-01-20 LAB — COMPREHENSIVE METABOLIC PANEL
Albumin: 3.7 (ref 3.5–5.0)
Calcium: 9.1 (ref 8.7–10.7)
Globulin: 2.1

## 2023-01-20 LAB — VITAMIN B12: Vitamin B-12: 1966

## 2023-01-21 DIAGNOSIS — I5020 Unspecified systolic (congestive) heart failure: Secondary | ICD-10-CM | POA: Diagnosis not present

## 2023-01-21 DIAGNOSIS — F02A4 Dementia in other diseases classified elsewhere, mild, with anxiety: Secondary | ICD-10-CM | POA: Diagnosis not present

## 2023-01-21 DIAGNOSIS — N1831 Chronic kidney disease, stage 3a: Secondary | ICD-10-CM | POA: Diagnosis not present

## 2023-01-21 DIAGNOSIS — H900 Conductive hearing loss, bilateral: Secondary | ICD-10-CM | POA: Diagnosis not present

## 2023-01-21 DIAGNOSIS — J309 Allergic rhinitis, unspecified: Secondary | ICD-10-CM | POA: Diagnosis not present

## 2023-01-21 DIAGNOSIS — J449 Chronic obstructive pulmonary disease, unspecified: Secondary | ICD-10-CM | POA: Diagnosis not present

## 2023-01-25 DIAGNOSIS — H900 Conductive hearing loss, bilateral: Secondary | ICD-10-CM | POA: Diagnosis not present

## 2023-01-25 DIAGNOSIS — I5020 Unspecified systolic (congestive) heart failure: Secondary | ICD-10-CM | POA: Diagnosis not present

## 2023-01-25 DIAGNOSIS — N1831 Chronic kidney disease, stage 3a: Secondary | ICD-10-CM | POA: Diagnosis not present

## 2023-01-25 DIAGNOSIS — J449 Chronic obstructive pulmonary disease, unspecified: Secondary | ICD-10-CM | POA: Diagnosis not present

## 2023-01-25 DIAGNOSIS — F02A4 Dementia in other diseases classified elsewhere, mild, with anxiety: Secondary | ICD-10-CM | POA: Diagnosis not present

## 2023-01-25 DIAGNOSIS — J309 Allergic rhinitis, unspecified: Secondary | ICD-10-CM | POA: Diagnosis not present

## 2023-01-28 DIAGNOSIS — F02A4 Dementia in other diseases classified elsewhere, mild, with anxiety: Secondary | ICD-10-CM | POA: Diagnosis not present

## 2023-01-28 DIAGNOSIS — J309 Allergic rhinitis, unspecified: Secondary | ICD-10-CM | POA: Diagnosis not present

## 2023-01-28 DIAGNOSIS — J449 Chronic obstructive pulmonary disease, unspecified: Secondary | ICD-10-CM | POA: Diagnosis not present

## 2023-01-28 DIAGNOSIS — I5020 Unspecified systolic (congestive) heart failure: Secondary | ICD-10-CM | POA: Diagnosis not present

## 2023-01-28 DIAGNOSIS — H900 Conductive hearing loss, bilateral: Secondary | ICD-10-CM | POA: Diagnosis not present

## 2023-01-28 DIAGNOSIS — N1831 Chronic kidney disease, stage 3a: Secondary | ICD-10-CM | POA: Diagnosis not present

## 2023-02-01 DIAGNOSIS — N1831 Chronic kidney disease, stage 3a: Secondary | ICD-10-CM | POA: Diagnosis not present

## 2023-02-01 DIAGNOSIS — I5020 Unspecified systolic (congestive) heart failure: Secondary | ICD-10-CM | POA: Diagnosis not present

## 2023-02-01 DIAGNOSIS — J309 Allergic rhinitis, unspecified: Secondary | ICD-10-CM | POA: Diagnosis not present

## 2023-02-01 DIAGNOSIS — J449 Chronic obstructive pulmonary disease, unspecified: Secondary | ICD-10-CM | POA: Diagnosis not present

## 2023-02-01 DIAGNOSIS — F02A4 Dementia in other diseases classified elsewhere, mild, with anxiety: Secondary | ICD-10-CM | POA: Diagnosis not present

## 2023-02-01 DIAGNOSIS — H900 Conductive hearing loss, bilateral: Secondary | ICD-10-CM | POA: Diagnosis not present

## 2023-02-02 ENCOUNTER — Other Ambulatory Visit: Payer: Self-pay | Admitting: Adult Health

## 2023-02-02 DIAGNOSIS — F419 Anxiety disorder, unspecified: Secondary | ICD-10-CM

## 2023-02-02 DIAGNOSIS — G629 Polyneuropathy, unspecified: Secondary | ICD-10-CM

## 2023-02-02 DIAGNOSIS — M159 Polyosteoarthritis, unspecified: Secondary | ICD-10-CM

## 2023-02-02 MED ORDER — PREGABALIN 75 MG PO CAPS: 75.0000 mg | ORAL_CAPSULE | Freq: Every day | ORAL | 0 refills | Status: DC

## 2023-02-02 MED ORDER — ZOLPIDEM TARTRATE 5 MG PO TABS: 5.0000 mg | ORAL_TABLET | Freq: Every day | ORAL | 0 refills | Status: DC

## 2023-02-04 DIAGNOSIS — H900 Conductive hearing loss, bilateral: Secondary | ICD-10-CM | POA: Diagnosis not present

## 2023-02-04 DIAGNOSIS — F02A4 Dementia in other diseases classified elsewhere, mild, with anxiety: Secondary | ICD-10-CM | POA: Diagnosis not present

## 2023-02-04 DIAGNOSIS — I5020 Unspecified systolic (congestive) heart failure: Secondary | ICD-10-CM | POA: Diagnosis not present

## 2023-02-04 DIAGNOSIS — J309 Allergic rhinitis, unspecified: Secondary | ICD-10-CM | POA: Diagnosis not present

## 2023-02-04 DIAGNOSIS — N1831 Chronic kidney disease, stage 3a: Secondary | ICD-10-CM | POA: Diagnosis not present

## 2023-02-04 DIAGNOSIS — J449 Chronic obstructive pulmonary disease, unspecified: Secondary | ICD-10-CM | POA: Diagnosis not present

## 2023-02-07 ENCOUNTER — Encounter: Payer: Self-pay | Admitting: Nurse Practitioner

## 2023-02-07 ENCOUNTER — Non-Acute Institutional Stay (SKILLED_NURSING_FACILITY): Payer: Medicare Other | Admitting: Nurse Practitioner

## 2023-02-07 DIAGNOSIS — I10 Essential (primary) hypertension: Secondary | ICD-10-CM | POA: Diagnosis not present

## 2023-02-07 DIAGNOSIS — M159 Polyosteoarthritis, unspecified: Secondary | ICD-10-CM

## 2023-02-07 DIAGNOSIS — R300 Dysuria: Secondary | ICD-10-CM

## 2023-02-07 DIAGNOSIS — L03116 Cellulitis of left lower limb: Secondary | ICD-10-CM | POA: Diagnosis not present

## 2023-02-07 DIAGNOSIS — D62 Acute posthemorrhagic anemia: Secondary | ICD-10-CM | POA: Diagnosis not present

## 2023-02-07 DIAGNOSIS — J4489 Other specified chronic obstructive pulmonary disease: Secondary | ICD-10-CM | POA: Diagnosis not present

## 2023-02-07 DIAGNOSIS — I509 Heart failure, unspecified: Secondary | ICD-10-CM

## 2023-02-07 NOTE — Assessment & Plan Note (Signed)
Blood pressure is controlled, only takes Furosemide.

## 2023-02-07 NOTE — Assessment & Plan Note (Signed)
Chronic lower back, leg pain,  R shoulder pain, had Ortho eval, takes Lyrica, Tylenol, Norco

## 2023-02-07 NOTE — Assessment & Plan Note (Signed)
Hx of Hgb 6.8 in hospital s/p 2 units of PRBC, on  Vit B12, Iron, Hgb 9.8 01/11/23

## 2023-02-07 NOTE — Assessment & Plan Note (Signed)
the left lower leg warmth, redness, swelling, open wound with yellow slough at wound bed.  Doxycycline 100mg  bid x 10 days, cover the wound with hydrocolloid dressing, change q3days and prn.

## 2023-02-07 NOTE — Assessment & Plan Note (Signed)
on  Nitrofurantoin 50mg qd for UTI suppression, Dr. Ottelin.  

## 2023-02-07 NOTE — Assessment & Plan Note (Signed)
 intermittent chronic mild expiratory wheezes, c/o SOB, uses O2,  takes Benzonatate, DuoNeb, prn Albuterol/Budsonide, cough is chronic, on Prednisone low dose. treated with higher dose of Prednisone for symptomatic control.

## 2023-02-07 NOTE — Progress Notes (Signed)
Location:   SNF [facility] Room Number: 60A Place of Service:  SNF (31) Provider: Arna Snipe Keiona Jenison NP  Lamin Chandley X, NP  Patient Care Team: Jennfer Gassen X, NP as PCP - General (Internal Medicine) April Carlyon X, NP as Nurse Practitioner (Internal Medicine)  Extended Emergency Contact Information Primary Emergency Contact: Codispoti,Jeff Home Phone: [phone] Relation: Son Secondary Emergency Contact: Culbreth Sr.,Christopher Home Phone: [phone] Relation: Relative  Code Status: DNR Goals of care: Advanced Directive information    01/07/2023    1:30 PM  Advanced Directives  Does Patient Have a Medical Advance Directive? Yes  Type of Advance Directive Living will;Out of facility DNR (pink MOST or yellow form)  Does patient want to make changes to medical advance directive? No - Patient declined  Pre-existing out of facility DNR order (yellow form or pink MOST form) Yellow form placed in chart (order not valid for inpatient use)     Chief Complaint  Patient presents with  . Acute Visit    Left lower leg warmth, redness, swelling, open wound.     HPI:  Pt is a 89 y.o. female seen today for an acute visit for the left lower leg warmth, redness, swelling, open wound with yellow slough at wound bed.              Gait abnormality, walker for ambulation,  risk falling             CHF clinically presumed. 08/16/22 Echo mild aortic stenosis, normal left ventricular systolic function, no EF.  BNP in 364 08/12/22>>248 09/21/22, chronic edema BLE, on Furosemide. (CXR showed central pulmonary venous congestion w/o frank pulmonary edema, chronic lung disease, no acute findings of osseous structures)             COPD, intermittent chronic mild expiratory wheezes, c/o SOB, uses O2,  takes Benzonatate, DuoNeb, prn Albuterol/Budsonide, cough is chronic, on Prednisone low dose. treated with higher dose of Prednisone for symptomatic control.  Hx of CVA, CT x2 03/2021 showed no acute findings except  age indeterminate right corona radiator infarct, small vessel changes. Neurology recommended no further work ups.              Macular degeneration, f/u [facility]: OU for [place] MD involving central area. OS exudative age related MD             Chronic lower back, leg pain,  R shoulder pain, had Ortho eval, takes Lyrica, Tylenol, Norco.              HTN, off  Metoprolol.              Urinary symptoms, on  Nitrofurantoin 50mg  qd for UTI suppression, Dr. Vernie Ammons             Anemia, Hx of Hgb 6.8 in hospital s/p 2 units of PRBC, on  Vit B12, Iron, Hgb 9.8 01/11/23             CKD Bun/creat 17/1.15 01/11/23             GERD/chronic gastritis/atrophic gastritis, GI bleed 02/2020, s/p EGD, per biopsy, takes PPI,  F/u GI prn, taking Omeprazole.  Fibromyalgia takes Norco, lyrica             Tactile hallucinations, off meds. TSH 1.03              Constipation, takes MiraLax,  Colace               Prediabetes,  Hgb a1c 6.6 08/12/22             Hypokalemia, K 3.9 01/11/23             Insomnia, takes Ambien, Lexapro                    Past Medical History:  Diagnosis Date  . Acute blood loss anemia 08/23/2013   04/13/16 TSH 1.20, Na 141, K 4.2, Bun 15, creat 0.79, wbc 5.5, Hgb 11.5, plt 283  . Anxiety   . Anxiety state 07/02/2007   Qualifier: Diagnosis of  By: Kriste Basque MD, Lonzo Cloud   . Asthma   . 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 Cigarette smoker   . Colon polyps 2009   TWO TUBULAR ADENOMAS AND HYPERPLASTIC POLYPS  . Congestive heart failure (CHF) (HCC) 08/13/2022  . Constipation 09/14/2013  . COPD (chronic obstructive pulmonary disease) (HCC)   . Diverticulosis of colon   . DJD (degenerative joint disease)   . Dog bite of limb 07/14/2010   Dog bite 07/10/10 - dogs shots utd Last td was 08/2009  Localized tx only.    . Fibromyalgia   . GERD 12/19/2006   Qualifier: Diagnosis of  By: Renaldo Fiddler CMA, Marliss Czar  04/13/16 TSH 1.20, Na 141, K 4.2, Bun 15, creat 0.79, wbc 5.5, Hgb 11.5, plt 283   .  GERD (gastroesophageal reflux disease)   . Hammer toe of second toe of left foot 04/08/2016   Left 2nd  . Hearing loss   . Hemorrhoids   . Hypercholesterolemia   . Hypertension   . Osteoarthritis 12/19/2006   Qualifier: Diagnosis of  By: Renaldo Fiddler CMA, Marliss Czar    . Osteoporosis    Past Surgical History:  Procedure Laterality Date  . BIOPSY  02/17/2020   Procedure: BIOPSY;  Surgeon: Lynann Bologna, MD;  Location: [facility];  Service: Endoscopy;;  . CATARACT EXTRACTION, BILATERAL  2009  . COLONOSCOPY  2009, 2006 , 2017  . ESOPHAGOGASTRODUODENOSCOPY (EGD) WITH PROPOFOL N/A 02/17/2020   Procedure: ESOPHAGOGASTRODUODENOSCOPY (EGD) WITH PROPOFOL;  Surgeon: Lynann Bologna, MD;  Location: [facility];  Service: Endoscopy;  Laterality: N/A;  . IR ANGIOGRAM SELECTIVE EACH ADDITIONAL VESSEL  02/17/2020  . IR ANGIOGRAM SELECTIVE EACH ADDITIONAL VESSEL  02/17/2020  . IR ANGIOGRAM SELECTIVE EACH ADDITIONAL VESSEL  02/17/2020  . IR ANGIOGRAM SELECTIVE EACH ADDITIONAL VESSEL  02/17/2020  . IR ANGIOGRAM SELECTIVE EACH ADDITIONAL VESSEL  02/17/2020  . IR ANGIOGRAM VISCERAL SELECTIVE  02/17/2020  . IR ANGIOGRAM VISCERAL SELECTIVE  02/17/2020  . IR US GUIDE VASC ACCESS RIGHT  02/17/2020  . stapes surgery     Dr Dorma Russell  . TONSILLECTOMY AND ADENOIDECTOMY     as a child    Allergies  Allergen Reactions  . Penicillins Anaphylaxis  . Bisphosphonates Other (See Comments)    [name]  . Influenza Vaccines     Unknown  . Simvastatin     Unknown   . Trazodone And Nefazodone     Felt her throat was closing up/kept her awake  . Sulfonamide Derivatives Rash and Other (See Comments)    blisters    Allergies as of 02/07/2023       Reactions   Penicillins Anaphylaxis   Bisphosphonates Other (See Comments)   [name]   Influenza Vaccines    Unknown   Simvastatin    Unknown    Trazodone And Nefazodone    Felt her throat was closing up/kept her awake  Sulfonamide Derivatives Rash, Other (See Comments)    blisters        Medication List        Accurate as of February 07, 2023 11:59 PM. If you have any questions, ask your nurse or doctor.          acetaminophen 325 MG tablet Commonly known as: TYLENOL Take 650 mg by mouth every 4 (four) hours as needed for fever.  Give 650 mg by mouth at bedtime   albuterol 108 (90 Base) MCG/ACT inhaler Commonly known as: VENTOLIN HFA Inhale into the lungs every 6 (six) hours as needed for wheezing or shortness of breath.   Albuterol-Budesonide 90-80 MCG/ACT Aero Inhale 2 puffs into the lungs every 6 (six) hours as needed (shortness of breath/ wheezing).   Antacid 750 MG chewable tablet Generic drug: calcium carbonate Chew 1 tablet by mouth daily.   benzonatate 100 MG capsule Commonly known as: TESSALON Take 100 mg by mouth 2 (two) times daily.   budesonide-formoterol 80-4.5 MCG/ACT inhaler Commonly known as: SYMBICORT Inhale 1 puff into the lungs daily.   cholecalciferol 1000 units tablet Commonly known as: VITAMIN D Take 1,000 Units by mouth daily.   cyanocobalamin 1000 MCG tablet Commonly known as: VITAMIN B12 Take 1,000 mcg by mouth daily.   docusate sodium 100 MG capsule Commonly known as: COLACE Take 100 mg by mouth 2 (two) times daily.   escitalopram 20 MG tablet Commonly known as: LEXAPRO Take 20 mg by mouth at bedtime.   ferrous sulfate 325 (65 FE) MG tablet Take 325 mg by mouth. Once A Day on Mon, Wed, Fri   furosemide 20 MG tablet Commonly known as: LASIX Take 20 mg by mouth daily.   furosemide 40 MG tablet Commonly known as: LASIX Take 1 tablet (40 mg total) by mouth daily for 2 days.   HYDROcodone-acetaminophen 5-325 MG tablet Commonly known as: NORCO/VICODIN Take 1 tablet by mouth every 6 (six) hours as needed for moderate pain.   hydrocortisone cream 1 % Apply 1 application topically 2 (two) times daily as needed (wrists).   hydrocortisone valerate cream 0.2 % Commonly known as: WESTCORT Apply 1  application topically 2 (two) times daily as needed (for itching). Cleanse behind ears with gentle cleanser and dry completely.   ipratropium-albuterol 0.5-2.5 (3) MG/3ML Soln Commonly known as: DUONEB Take 3 mLs by nebulization daily.   ipratropium-albuterol 0.5-2.5 (3) MG/3ML Soln Commonly known as: DUONEB Take 3 mLs by nebulization in the morning and at bedtime. 1 vial inhale orally every 24 hours as needed for Wheezing   ketoconazole 2 % cream Commonly known as: NIZORAL Apply 1 application  topically 2 (two) times daily as needed for irritation.   LORazepam 0.5 MG tablet Commonly known as: ATIVAN Take 0.5 mg by mouth every 4 (four) hours as needed for anxiety.   methocarbamol 500 MG tablet Commonly known as: ROBAXIN Take 500 mg by mouth every 6 (six) hours as needed for muscle spasms.   nitrofurantoin 50 MG capsule Commonly known as: MACRODANTIN Take 50 mg by mouth at bedtime.   Olopatadine HCl 0.2 % Soln Place 1 drop into both eyes daily.   polyethylene glycol 17 g packet Commonly known as: MIRALAX / GLYCOLAX Take 17 g by mouth 2 (two) times a week. Sunday and Wednesday   potassium chloride 20 MEQ packet Commonly known as: KLOR-CON Take 20 mEq by mouth 2 (two) times daily.   predniSONE 5 MG tablet Commonly known as: DELTASONE Take 5 mg  by mouth daily with breakfast.   pregabalin 75 MG capsule Commonly known as: LYRICA Take 1 capsule (75 mg total) by mouth at bedtime.   Sarna Sensitive 1 % Lotn Generic drug: pramoxine Apply to cheeks topically one time a day for rosy burning cheeks   Systane Complete 0.6 % Soln Generic drug: Propylene Glycol Instill 1 drop in both eyes four times a day for DRY EYE   Voltaren 1 % Gel Generic drug: diclofenac Sodium Apply 2 g topically every 8 (eight) hours as needed.   zolpidem 5 MG tablet Commonly known as: Ambien Take 1 tablet (5 mg total) by mouth at bedtime.        Review of Systems  Constitutional:  Negative  for appetite change, fatigue and fever.  HENT:  Positive for hearing loss. Negative for congestion and voice change.   Eyes:  Negative for visual disturbance.       C/o blurred vision, ? Color, ? Right eye peripheral vision loss-f/u Ophthalmology  Respiratory:  Positive for cough and shortness of breath. Negative for chest tightness and wheezing.        DOE, chronic hacking cough  Cardiovascular:  Positive for leg swelling. Negative for chest pain and palpitations.  Gastrointestinal:  Negative for abdominal pain and constipation.  Genitourinary:  Negative for dysuria and urgency.       Chronic, on and off  Musculoskeletal:  Positive for arthralgias, back pain, gait problem and myalgias. Negative for joint swelling.  Skin:  Positive for wound.  Neurological:  Negative for speech difficulty, weakness and light-headedness.  Psychiatric/Behavioral:  Negative for behavioral problems, hallucinations and sleep disturbance.        Chronic going to bed late, sleeping in late.     Immunization History  Administered Date(s) Administered  . Moderna SARS-COV2 Booster Vaccination 12/25/2019  . Moderna Sars-Covid-2 Vaccination 02/17/2019, 03/17/2019  . Pneumococcal Conjugate-13 09/01/2015  . Pneumococcal Polysaccharide-23 07/07/1998  . Td 08/21/2009  . Tdap 09/22/2022   Pertinent  Health Maintenance Due  Topic Date Due  . DEXA SCAN  Completed  . INFLUENZA VACCINE  Discontinued      03/20/2021   12:00 AM 03/20/2021   12:00 PM 02/05/2022    9:16 AM 03/09/2022   10:01 AM 07/23/2022    3:44 PM  Fall Risk  Falls in the past year?   0 0 0  Was there an injury with Fall?   0 0 0  Fall Risk Category Calculator   0 0 0  Fall Risk Category (Retired)   Low    (RETIRED) Patient Fall Risk Level High fall risk High fall risk High fall risk    Patient at Risk for Falls Due to   History of fall(s) No Fall Risks No Fall Risks  Fall risk Follow up   Falls evaluation completed Falls evaluation completed Falls  evaluation completed   Functional Status Survey:    Vitals:   02/07/23 1246  BP: (!) 153/56  Pulse: 69  Resp: 20  Temp: 97.6 F (36.4 C)  SpO2: 98%  Weight: 160 lb 3.2 oz (72.7 kg)   Body mass index is 31.29 kg/m. Physical Exam Vitals and nursing note reviewed.  Constitutional:      Appearance: Normal appearance.  HENT:     Head: Normocephalic and atraumatic.     Nose: Nose normal.     Mouth/Throat:     Mouth: Mucous membranes are moist.  Eyes:     Extraocular Movements: Extraocular movements intact.  Conjunctiva/sclera: Conjunctivae normal.     Pupils: Pupils are equal, round, and reactive to light.     Comments: Able to count my fingers 3 feet away from her eyes.   Cardiovascular:     Rate and Rhythm: Normal rate and regular rhythm.     Heart sounds: No murmur heard. Pulmonary:     Effort: Pulmonary effort is normal.     Breath sounds: Rales present. No wheezing.     Comments: Decreased air entry to both lungs. Needs O2 via [place] to maintain SatO2>90, rales bibasilar.  Abdominal:     General: Bowel sounds are normal.     Palpations: Abdomen is soft.     Tenderness: There is no abdominal tenderness.  Musculoskeletal:        General: Tenderness present.     Cervical back: Normal range of motion and neck supple.     Right lower leg: Edema present.     Left lower leg: Edema present.     Comments: mild edema BLE. R shoulder pain with overhead ROM.    Skin:    General: Skin is warm and dry.     Findings: Erythema present.     Comments: The lower left leg redness, warmth, swelling. Stasis ulcer with yellow slough in the previous skin tear area   Neurological:     General: No focal deficit present.     Mental Status: She is alert. Mental status is at baseline.     Motor: No weakness.     Coordination: Coordination normal.     Gait: Gait abnormal.     Comments: Oriented to person, place.   Psychiatric:        Mood and Affect: Mood normal.        Behavior:  Behavior normal.    Labs reviewed: Recent Labs    07/31/22 1448 08/13/22 0000 08/24/22 0000 11/09/22 0000  NA 137 142 141 139  K 3.6 3.1* 4.1 3.7  CL 100 101 103 98*  CO2 27 29* 31* 32*  GLUCOSE 112*  --   --   --   BUN 16 22* 18 20  CREATININE 1.25* 1.2* 1.1 1.1  CALCIUM 9.1 8.8 8.8 9.0   Recent Labs    07/31/22 1448  AST 20  ALT 19  ALKPHOS 78  BILITOT 1.2  PROT 6.1*  ALBUMIN 3.6   Recent Labs    07/27/22 0000 07/31/22 1448  WBC 8.1 7.0  HGB 12.3 13.3  HCT 37 41.6  MCV  --  95.2  PLT 194 179   Lab Results  Component Value Date   TSH 1.03 08/13/2022   Lab Results  Component Value Date   HGBA1C 6.6 08/13/2022   Lab Results  Component Value Date   CHOL 192 03/19/2021   HDL 69 03/19/2021   LDLCALC 107 (H) 03/19/2021   LDLDIRECT 114.7 08/18/2011   TRIG 78 03/19/2021   CHOLHDL 2.8 03/19/2021    Significant Diagnostic Results in last 30 days:  No results found.  Assessment/Plan: Cellulitis of left lower leg the left lower leg warmth, redness, swelling, open wound with yellow slough at wound bed.  Doxycycline 100mg  bid x 10 days, cover the wound with hydrocolloid dressing, change q3days and prn.   Congestive heart failure (CHF) (HCC) 08/16/22 Echo mild aortic stenosis, normal left ventricular systolic function, no EF.  BNP in 364 08/12/22>>248 09/21/22, chronic edema BLE, on Furosemide. (CXR showed central pulmonary venous congestion w/o frank pulmonary edema, chronic lung disease,  no acute findings of osseous structures)  COPD (chronic obstructive pulmonary disease) with chronic bronchitis  intermittent chronic mild expiratory wheezes, c/o SOB, uses O2,  takes Benzonatate, DuoNeb, prn Albuterol/Budsonide, cough is chronic, on Prednisone low dose. treated with higher dose of Prednisone for symptomatic control.   Osteoarthritis  Chronic lower back, leg pain,  R shoulder pain, had Ortho eval, takes Lyrica, Tylenol, Norco.   HTN (hypertension) Blood  pressure is controlled, only takes Furosemide.   Dysuria on  Nitrofurantoin 50mg  qd for UTI suppression, Dr. Juliann Pares (acute blood loss anemia) Hx of Hgb 6.8 in hospital s/p 2 units of PRBC, on  Vit B12, Iron, Hgb 9.8 01/11/23    Family/ staff Communication: plan of care reviewed with the patient and charge nurse.   Labs/tests ordered:  none  Time spend 30 minutes.

## 2023-02-07 NOTE — Assessment & Plan Note (Signed)
08/16/22 Echo mild aortic stenosis, normal left ventricular systolic function, no EF.  BNP in 364 08/12/22>>248 09/21/22, chronic edema BLE, on Furosemide. (CXR showed central pulmonary venous congestion w/o frank pulmonary edema, chronic lung disease, no acute findings of osseous structures)

## 2023-02-08 DIAGNOSIS — H900 Conductive hearing loss, bilateral: Secondary | ICD-10-CM | POA: Diagnosis not present

## 2023-02-08 DIAGNOSIS — F02A4 Dementia in other diseases classified elsewhere, mild, with anxiety: Secondary | ICD-10-CM | POA: Diagnosis not present

## 2023-02-08 DIAGNOSIS — I5020 Unspecified systolic (congestive) heart failure: Secondary | ICD-10-CM | POA: Diagnosis not present

## 2023-02-08 DIAGNOSIS — J449 Chronic obstructive pulmonary disease, unspecified: Secondary | ICD-10-CM | POA: Diagnosis not present

## 2023-02-08 DIAGNOSIS — N1831 Chronic kidney disease, stage 3a: Secondary | ICD-10-CM | POA: Diagnosis not present

## 2023-02-08 DIAGNOSIS — J309 Allergic rhinitis, unspecified: Secondary | ICD-10-CM | POA: Diagnosis not present

## 2023-02-11 DIAGNOSIS — J309 Allergic rhinitis, unspecified: Secondary | ICD-10-CM | POA: Diagnosis not present

## 2023-02-11 DIAGNOSIS — I5020 Unspecified systolic (congestive) heart failure: Secondary | ICD-10-CM | POA: Diagnosis not present

## 2023-02-11 DIAGNOSIS — J449 Chronic obstructive pulmonary disease, unspecified: Secondary | ICD-10-CM | POA: Diagnosis not present

## 2023-02-11 DIAGNOSIS — N1831 Chronic kidney disease, stage 3a: Secondary | ICD-10-CM | POA: Diagnosis not present

## 2023-02-11 DIAGNOSIS — H900 Conductive hearing loss, bilateral: Secondary | ICD-10-CM | POA: Diagnosis not present

## 2023-02-11 DIAGNOSIS — F02A4 Dementia in other diseases classified elsewhere, mild, with anxiety: Secondary | ICD-10-CM | POA: Diagnosis not present

## 2023-02-15 DIAGNOSIS — H900 Conductive hearing loss, bilateral: Secondary | ICD-10-CM | POA: Diagnosis not present

## 2023-02-15 DIAGNOSIS — J449 Chronic obstructive pulmonary disease, unspecified: Secondary | ICD-10-CM | POA: Diagnosis not present

## 2023-02-15 DIAGNOSIS — F02A4 Dementia in other diseases classified elsewhere, mild, with anxiety: Secondary | ICD-10-CM | POA: Diagnosis not present

## 2023-02-15 DIAGNOSIS — I5020 Unspecified systolic (congestive) heart failure: Secondary | ICD-10-CM | POA: Diagnosis not present

## 2023-02-15 DIAGNOSIS — N1831 Chronic kidney disease, stage 3a: Secondary | ICD-10-CM | POA: Diagnosis not present

## 2023-02-15 DIAGNOSIS — J309 Allergic rhinitis, unspecified: Secondary | ICD-10-CM | POA: Diagnosis not present

## 2023-02-16 DIAGNOSIS — I5020 Unspecified systolic (congestive) heart failure: Secondary | ICD-10-CM | POA: Diagnosis not present

## 2023-02-16 DIAGNOSIS — G4700 Insomnia, unspecified: Secondary | ICD-10-CM | POA: Diagnosis not present

## 2023-02-16 DIAGNOSIS — J449 Chronic obstructive pulmonary disease, unspecified: Secondary | ICD-10-CM | POA: Diagnosis not present

## 2023-02-16 DIAGNOSIS — F32A Depression, unspecified: Secondary | ICD-10-CM | POA: Diagnosis not present

## 2023-02-16 DIAGNOSIS — N1831 Chronic kidney disease, stage 3a: Secondary | ICD-10-CM | POA: Diagnosis not present

## 2023-02-16 DIAGNOSIS — J42 Unspecified chronic bronchitis: Secondary | ICD-10-CM | POA: Diagnosis not present

## 2023-02-16 DIAGNOSIS — F02A4 Dementia in other diseases classified elsewhere, mild, with anxiety: Secondary | ICD-10-CM | POA: Diagnosis not present

## 2023-02-16 DIAGNOSIS — R12 Heartburn: Secondary | ICD-10-CM | POA: Diagnosis not present

## 2023-02-16 DIAGNOSIS — D649 Anemia, unspecified: Secondary | ICD-10-CM | POA: Diagnosis not present

## 2023-02-16 DIAGNOSIS — K219 Gastro-esophageal reflux disease without esophagitis: Secondary | ICD-10-CM | POA: Diagnosis not present

## 2023-02-16 DIAGNOSIS — J309 Allergic rhinitis, unspecified: Secondary | ICD-10-CM | POA: Diagnosis not present

## 2023-02-16 DIAGNOSIS — H900 Conductive hearing loss, bilateral: Secondary | ICD-10-CM | POA: Diagnosis not present

## 2023-02-18 ENCOUNTER — Encounter: Payer: Self-pay | Admitting: Nurse Practitioner

## 2023-02-18 ENCOUNTER — Non-Acute Institutional Stay (SKILLED_NURSING_FACILITY): Payer: Self-pay | Admitting: Nurse Practitioner

## 2023-02-18 DIAGNOSIS — K2900 Acute gastritis without bleeding: Secondary | ICD-10-CM

## 2023-02-18 DIAGNOSIS — K219 Gastro-esophageal reflux disease without esophagitis: Secondary | ICD-10-CM | POA: Diagnosis not present

## 2023-02-18 DIAGNOSIS — I10 Essential (primary) hypertension: Secondary | ICD-10-CM

## 2023-02-18 DIAGNOSIS — M159 Polyosteoarthritis, unspecified: Secondary | ICD-10-CM | POA: Diagnosis not present

## 2023-02-18 DIAGNOSIS — D62 Acute posthemorrhagic anemia: Secondary | ICD-10-CM

## 2023-02-18 DIAGNOSIS — N1831 Chronic kidney disease, stage 3a: Secondary | ICD-10-CM

## 2023-02-18 DIAGNOSIS — I509 Heart failure, unspecified: Secondary | ICD-10-CM | POA: Diagnosis not present

## 2023-02-18 DIAGNOSIS — F411 Generalized anxiety disorder: Secondary | ICD-10-CM | POA: Diagnosis not present

## 2023-02-18 DIAGNOSIS — J4489 Other specified chronic obstructive pulmonary disease: Secondary | ICD-10-CM

## 2023-02-18 NOTE — Assessment & Plan Note (Addendum)
 08/16/22 Echo mild aortic stenosis, normal left ventricular systolic function, no EF.  BNP in 364 08/12/22>>248 09/21/22, chronic edema BLE, on Furosemide . (CXR showed central pulmonary venous congestion w/o frank pulmonary edema, chronic lung disease, no acute findings of osseous structures) Hold Furosemide  Kcl until vomiting/diarrhea resolved.

## 2023-02-18 NOTE — Assessment & Plan Note (Signed)
 takes Ambien, Lexapro

## 2023-02-18 NOTE — Assessment & Plan Note (Signed)
 GERD/chronic gastritis/atrophic gastritis, GI bleed 02/2020, s/p EGD, per biopsy, takes PPI,  F/u GI prn, taking Omeprazole.

## 2023-02-18 NOTE — Assessment & Plan Note (Signed)
 intermittent chronic mild expiratory wheezes, c/o SOB, uses O2,  takes Benzonatate, DuoNeb, prn Albuterol/Budsonide, cough is chronic, on Prednisone low dose. treated with higher dose of Prednisone for symptomatic control.

## 2023-02-18 NOTE — Progress Notes (Signed)
 Location:   [facility] Room Number: 60A Place of Service:  SNF (31) Provider: Larwance Toluwani Ruder NP  Rashmi Tallent X, NP  Patient Care Team: Jashay Roddy X, NP as PCP - General (Internal Medicine) Keinan Brouillet X, NP as Nurse Practitioner (Internal Medicine)  Extended Emergency Contact Information Primary Emergency Contact: Stoneking,Jeff Home Phone: [phone] Relation: Son Secondary Emergency Contact: Culbreth Sr.,Christopher Home Phone: [phone] Relation: Relative  Code Status: DNR Goals of care: Advanced Directive information    01/07/2023    1:30 PM  Advanced Directives  Does Patient Have a Medical Advance Directive? Yes  Type of Advance Directive Living will;Out of facility DNR (pink MOST or yellow form)  Does patient want to make changes to medical advance directive? No - Patient declined  Pre-existing out of facility DNR order (yellow form or pink MOST form) Yellow form placed in chart (order not valid for inpatient use)     Chief Complaint  Patient presents with   Acute Visit    Feeling nauseous    HPI:  Pt is a 89 y.o. female seen today for an acute visit for c/o nauseous, vomiting, abd discomfort, and diarrhea,  afebrile, recent facility norovirus breakout.   Gait abnormality, walker for ambulation,  risk falling             CHF clinically presumed. 08/16/22 Echo mild aortic stenosis, normal left ventricular systolic function, no EF.  BNP in 364 08/12/22>>248 09/21/22, chronic edema BLE, on Furosemide . (CXR showed central pulmonary venous congestion w/o frank pulmonary edema, chronic lung disease, no acute findings of osseous structures)             COPD, intermittent chronic mild expiratory wheezes, c/o SOB, uses O2,  takes Benzonatate , DuoNeb, prn Albuterol /Budsonide, cough is chronic, on Prednisone  low dose. treated with higher dose of Prednisone  for symptomatic control.  Hx of CVA, CT x2 03/2021 showed no acute findings except age indeterminate right corona radiator  infarct, small vessel changes. Neurology recommended no further work ups.              Macular degeneration, f/u [facility]: OU for [place] MD involving central area. OS exudative age related MD             Chronic lower back, leg pain,  R shoulder pain, had Ortho eval, takes Lyrica , Tylenol , Norco, Morphine             HTN, off  Metoprolol .              Urinary symptoms, on  Nitrofurantoin 50mg  qd for UTI suppression, Dr. Ottelin             Anemia, Hx of Hgb 6.8 in hospital s/p 2 units of PRBC, on  Vit B12, Iron, Hgb 9.8 01/11/23<<10.5 01/20/23             CKD Bun/creat 19/1.13 01/20/23             GERD/chronic gastritis/atrophic gastritis, GI bleed 02/2020, s/p EGD, per biopsy, takes PPI,  F/u GI prn, taking Omeprazole .  Fibromyalgia takes Norco, lyrica              Tactile hallucinations, off meds. 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     Fibromyalgia    GERD 12/19/2006   Qualifier: Diagnosis of  By: Latisha CMA, Anette  04/13/16 TSH 1.20, Na 141, K 4.2, Bun 15, creat 0.79, wbc 5.5, Hgb 11.5, plt 283    GERD (gastroesophageal reflux  disease)    Hammer toe of second toe of left foot 04/08/2016   Left 2nd   Hearing loss    Hemorrhoids    Hypercholesterolemia    Hypertension    Osteoarthritis 12/19/2006   Qualifier: Diagnosis of  By: Latisha CMA, Leigh     Osteoporosis    Past Surgical History:  Procedure Laterality Date   BIOPSY  02/17/2020   Procedure: BIOPSY;  Surgeon: Charlanne Groom, MD;  Location: [facility];  Service: Endoscopy;;   CATARACT EXTRACTION, BILATERAL  2009   COLONOSCOPY  2009, 2006 , 2017   ESOPHAGOGASTRODUODENOSCOPY (EGD) WITH PROPOFOL  N/A 02/17/2020   Procedure: ESOPHAGOGASTRODUODENOSCOPY (EGD) WITH PROPOFOL ;  Surgeon: Charlanne Groom, MD;  Location: [facility];  Service: Endoscopy;  Laterality: N/A;   IR ANGIOGRAM SELECTIVE EACH ADDITIONAL VESSEL  02/17/2020   IR ANGIOGRAM SELECTIVE EACH ADDITIONAL VESSEL  02/17/2020   IR ANGIOGRAM SELECTIVE EACH ADDITIONAL VESSEL  02/17/2020   IR ANGIOGRAM SELECTIVE EACH ADDITIONAL VESSEL  02/17/2020   IR ANGIOGRAM SELECTIVE EACH ADDITIONAL VESSEL  02/17/2020   IR ANGIOGRAM VISCERAL SELECTIVE  02/17/2020   IR ANGIOGRAM VISCERAL SELECTIVE  02/17/2020   IR US  GUIDE VASC ACCESS RIGHT  02/17/2020   stapes surgery     Dr Thaddeus   TONSILLECTOMY AND ADENOIDECTOMY     as a child    Allergies  Allergen Reactions   Penicillins Anaphylaxis   Bisphosphonates Other (See Comments)    [name]   Influenza Vaccines     Unknown   Simvastatin     Unknown    Trazodone  And Nefazodone     Felt her throat was closing up/kept her awake   Sulfonamide Derivatives Rash and Other (See Comments)    blisters    Allergies as of 02/18/2023       Reactions   Penicillins Anaphylaxis   Bisphosphonates Other (See Comments)   [name]   Influenza Vaccines    Unknown   Simvastatin    Unknown    Trazodone  And Nefazodone    Felt her throat was closing up/kept her awake   Sulfonamide Derivatives Rash, Other (See Comments)   blisters        Medication  List        Accurate as  of February 18, 2023  3:15 PM. If you have any questions, ask your nurse or doctor.          acetaminophen  325 MG tablet Commonly known as: TYLENOL  Take 650 mg by mouth every 4 (four) hours as needed for fever.  Give 650 mg by mouth at bedtime   albuterol  108 (90 Base) MCG/ACT inhaler Commonly known as: VENTOLIN  HFA Inhale into the lungs every 6 (six) hours as needed for wheezing or shortness of breath.   Albuterol -Budesonide 90-80 MCG/ACT Aero Inhale 2 puffs into the lungs every 6 (six) hours as needed (shortness of breath/ wheezing).   Antacid 750 MG chewable tablet Generic drug: calcium  carbonate Chew 1 tablet by mouth daily.   benzonatate  100 MG capsule Commonly known as: TESSALON  Take 100 mg by mouth 2 (two) times daily.   budesonide-formoterol 80-4.5 MCG/ACT inhaler Commonly known as: SYMBICORT Inhale 1 puff into the lungs daily.   cholecalciferol 1000 units tablet Commonly known as: VITAMIN D  Take 1,000 Units by mouth daily.   cyanocobalamin  1000 MCG tablet Commonly known as: VITAMIN B12 Take 1,000 mcg by mouth daily.   docusate sodium  100 MG capsule Commonly known as: COLACE Take 100 mg by mouth 2 (two) times daily.   escitalopram 20 MG tablet Commonly known as: LEXAPRO Take 20 mg by mouth at bedtime.   ferrous sulfate 325 (65 FE) MG tablet Take 325 mg by mouth. Once A Day on Mon, Wed, Fri   furosemide  20 MG tablet Commonly known as: LASIX  Take 20 mg by mouth daily.   furosemide  40 MG tablet Commonly known as: LASIX  Take 1 tablet (40 mg total) by mouth daily for 2 days.   HYDROcodone -acetaminophen  5-325 MG tablet Commonly known as: NORCO/VICODIN Take 1 tablet by mouth every 6 (six) hours as needed for moderate pain.   hydrocortisone  cream 1 % Apply 1 application topically 2 (two) times daily as needed (wrists).   hydrocortisone  valerate cream 0.2 % Commonly known as: WESTCORT  Apply 1 application topically 2 (two) times daily as needed (for  itching). Cleanse behind ears with gentle cleanser and dry completely.   ipratropium-albuterol  0.5-2.5 (3) MG/3ML Soln Commonly known as: DUONEB Take 3 mLs by nebulization daily.   ipratropium-albuterol  0.5-2.5 (3) MG/3ML Soln Commonly known as: DUONEB Take 3 mLs by nebulization in the morning and at bedtime. 1 vial inhale orally every 24 hours as needed for Wheezing   ketoconazole 2 % cream Commonly known as: NIZORAL Apply 1 application  topically 2 (two) times daily as needed for irritation.   LORazepam  0.5 MG tablet Commonly known as: ATIVAN  Take 0.5 mg by mouth every 4 (four) hours as needed for anxiety.   methocarbamol 500 MG tablet Commonly known as: ROBAXIN Take 500 mg by mouth every 6 (six) hours as needed for muscle spasms.   nitrofurantoin 50 MG capsule Commonly known as: MACRODANTIN Take 50 mg by mouth at bedtime.   Olopatadine HCl 0.2 % Soln Place 1 drop into both eyes daily.   polyethylene glycol 17 g packet Commonly known as: MIRALAX  / GLYCOLAX  Take 17 g by mouth 2 (two) times a week. Sunday and Wednesday   potassium chloride  20 MEQ packet Commonly known as: KLOR-CON  Take 20 mEq by mouth 2 (two) times daily.   predniSONE  5 MG tablet Commonly known as: DELTASONE  Take 5 mg by mouth daily with breakfast.   pregabalin  75 MG capsule Commonly known as: LYRICA  Take  1 capsule (75 mg total) by mouth at bedtime.   Sarna Sensitive 1 % Lotn Generic drug: pramoxine Apply to cheeks topically one time a day for rosy burning cheeks   Systane Complete 0.6 % Soln Generic drug: Propylene Glycol Instill 1 drop in both eyes four times a day for DRY EYE   Voltaren 1 % Gel Generic drug: diclofenac Sodium Apply 2 g topically every 8 (eight) hours as needed.   zolpidem  5 MG tablet Commonly known as: Ambien  Take 1 tablet (5 mg total) by mouth at bedtime.        Review of Systems  Constitutional:  Negative for appetite change, fatigue and fever.  HENT:  Positive  for hearing loss. Negative for congestion and voice change.   Eyes:  Negative for visual disturbance.       C/o blurred vision, ? Color, ? Right eye peripheral vision loss-f/u Ophthalmology  Respiratory:  Positive for cough and shortness of breath. Negative for chest tightness and wheezing.        DOE, chronic hacking cough  Cardiovascular:  Positive for leg swelling. Negative for chest pain and palpitations.  Gastrointestinal:  Positive for abdominal pain, diarrhea, nausea and vomiting.  Genitourinary:  Negative for dysuria and urgency.       Chronic, on and off  Musculoskeletal:  Positive for arthralgias, back pain, gait problem and myalgias. Negative for joint swelling.  Skin:  Positive for wound.  Neurological:  Negative for speech difficulty, weakness and light-headedness.  Psychiatric/Behavioral:  Negative for behavioral problems, hallucinations and sleep disturbance.        Chronic going to bed late, sleeping in late.     Immunization History  Administered Date(s) Administered   Moderna SARS-COV2 Booster Vaccination 12/25/2019   Moderna Sars-Covid-2 Vaccination 02/17/2019, 03/17/2019   Pneumococcal Conjugate-13 09/01/2015   Pneumococcal Polysaccharide-23 07/07/1998   Td 08/21/2009   Tdap 09/22/2022   Pertinent  Health Maintenance Due  Topic Date Due   DEXA SCAN  Completed   INFLUENZA VACCINE  Discontinued      03/20/2021   12:00 AM 03/20/2021   12:00 PM 02/05/2022    9:16 AM 03/09/2022   10:01 AM 07/23/2022    3:44 PM  Fall Risk  Falls in the past year?   0 0 0  Was there an injury with Fall?   0 0 0  Fall Risk Category Calculator   0 0 0  Fall Risk Category (Retired)   Low    (RETIRED) Patient Fall Risk Level High fall risk High fall risk High fall risk    Patient at Risk for Falls Due to   History of fall(s) No Fall Risks No Fall Risks  Fall risk Follow up   Falls evaluation completed Falls evaluation completed Falls evaluation completed   Functional Status Survey:     Vitals:   02/18/23 1247  BP: (!) 157/63  Pulse: 69  Resp: 20  Temp: (!) 97.3 F (36.3 C)  SpO2: 97%  Weight: 159 lb 8 oz (72.3 kg)   Body mass index is 31.15 kg/m. Physical Exam Vitals and nursing note reviewed.  Constitutional:      Appearance: Normal appearance.  HENT:     Head: Normocephalic and atraumatic.     Nose: Nose normal.     Mouth/Throat:     Mouth: Mucous membranes are moist.  Eyes:     Extraocular Movements: Extraocular movements intact.     Conjunctiva/sclera: Conjunctivae normal.     Pupils: Pupils are  equal, round, and reactive to light.     Comments: Able to count my fingers 3 feet away from her eyes.   Cardiovascular:     Rate and Rhythm: Normal rate and regular rhythm.     Heart sounds: No murmur heard. Pulmonary:     Effort: Pulmonary effort is normal.     Breath sounds: Rales present. No wheezing.     Comments: Decreased air entry to both lungs. Needs O2 via [place] to maintain SatO2>90, rales bibasilar.  Abdominal:     General: Bowel sounds are normal.     Palpations: Abdomen is soft.     Tenderness: There is abdominal tenderness. There is no right CVA tenderness, left CVA tenderness, guarding or rebound.  Musculoskeletal:        General: Tenderness present.     Cervical back: Normal range of motion and neck supple.     Right lower leg: Edema present.     Left lower leg: Edema present.     Comments: mild edema BLE. R shoulder pain with overhead ROM.    Skin:    General: Skin is warm and dry.     Findings: Erythema present.     Comments: The lower left leg redness, warmth, swelling. Stasis ulcer with yellow slough in the previous skin tear area Improved.    Neurological:     General: No focal deficit present.     Mental Status: She is alert. Mental status is at baseline.     Motor: No weakness.     Coordination: Coordination normal.     Gait: Gait abnormal.     Comments: Oriented to person, place.   Psychiatric:        Mood and Affect:  Mood normal.        Behavior: Behavior normal.     Labs reviewed: Recent Labs    07/31/22 1448 08/13/22 0000 08/24/22 0000 11/09/22 0000  NA 137 142 141 139  K 3.6 3.1* 4.1 3.7  CL 100 101 103 98*  CO2 27 29* 31* 32*  GLUCOSE 112*  --   --   --   BUN 16 22* 18 20  CREATININE 1.25* 1.2* 1.1 1.1  CALCIUM  9.1 8.8 8.8 9.0   Recent Labs    07/31/22 1448  AST 20  ALT 19  ALKPHOS 78  BILITOT 1.2  PROT 6.1*  ALBUMIN 3.6   Recent Labs    07/27/22 0000 07/31/22 1448  WBC 8.1 7.0  HGB 12.3 13.3  HCT 37 41.6  MCV  --  95.2  PLT 194 179   Lab Results  Component Value Date   TSH 1.03 08/13/2022   Lab Results  Component Value Date   HGBA1C 6.6 08/13/2022   Lab Results  Component Value Date   CHOL 192 03/19/2021   HDL 69 03/19/2021   LDLCALC 107 (H) 03/19/2021   LDLDIRECT 114.7 08/18/2011   TRIG 78 03/19/2021   CHOLHDL 2.8 03/19/2021    Significant Diagnostic Results in last 30 days:  No results found.  Assessment/Plan: Acute gastritis  c/o nauseous, abd discomfort, vomiting, or diarrhea, afebrile, recent facility norovirus breakout.  Zofran  4mg  tid with meals x 72hrs, then prn Supportive care CBC/diff, CMP/eGFR, hold Furosemide  until vomiting/diarrhea resolved.   Congestive heart failure (CHF) (HCC) 08/16/22 Echo mild aortic stenosis, normal left ventricular systolic function, no EF.  BNP in 364 08/12/22>>248 09/21/22, chronic edema BLE, on Furosemide . (CXR showed central pulmonary venous congestion w/o frank pulmonary edema, chronic lung  disease, no acute findings of osseous structures) Hold Furosemide  Kcl until vomiting/diarrhea resolved.   COPD (chronic obstructive pulmonary disease) with chronic bronchitis intermittent chronic mild expiratory wheezes, c/o SOB, uses O2,  takes Benzonatate , DuoNeb, prn Albuterol /Budsonide, cough is chronic, on Prednisone  low dose. treated with higher dose of Prednisone  for symptomatic control.   Osteoarthritis   Chronic  lower back, leg pain,  R shoulder pain, had Ortho eval, takes Lyrica , Tylenol , Norco, Morphine  HTN (hypertension) Blood pressure is controlled, off Metoprolol , only taking Furosemide .   CKD (chronic kidney disease) stage 3, GFR 30-59 ml/min (HCC) Bun/creat 19/1.13 01/20/23  GERD GERD/chronic gastritis/atrophic gastritis, GI bleed 02/2020, s/p EGD, per biopsy, takes PPI,  F/u GI prn, taking Omeprazole .   Anxiety state  takes Ambien , Lexapro                     ABLA (acute blood loss anemia)  Hx of Hgb 6.8 in hospital s/p 2 units of PRBC, on  Vit B12, Iron, Hgb 9.8 01/11/23<<10.5 01/20/23    Family/ staff Communication: plan of care reviewed with the patient and charge nurse.   Labs/tests ordered: CBC/diff, CMP/eGFR  Time spend 30 minutes.

## 2023-02-18 NOTE — Assessment & Plan Note (Addendum)
 c/o nauseous, abd discomfort, vomiting, or diarrhea, afebrile, recent facility norovirus breakout.  Zofran 4mg  tid with meals x 72hrs, then prn Supportive care CBC/diff, CMP/eGFR, hold Furosemide until vomiting/diarrhea resolved.

## 2023-02-18 NOTE — Assessment & Plan Note (Signed)
 Hx of Hgb 6.8 in hospital s/p 2 units of PRBC, on  Vit B12, Iron, Hgb 9.8 01/11/23<<10.5 01/20/23

## 2023-02-18 NOTE — Assessment & Plan Note (Signed)
 Blood pressure is controlled, off Metoprolol, only taking Furosemide.

## 2023-02-18 NOTE — Assessment & Plan Note (Signed)
 Bun/creat 19/1.13 01/20/23

## 2023-02-18 NOTE — Assessment & Plan Note (Signed)
 Chronic lower back, leg pain,  R shoulder pain, had Ortho eval, takes Lyrica, Tylenol, Norco, Morphine

## 2023-02-21 DIAGNOSIS — I5020 Unspecified systolic (congestive) heart failure: Secondary | ICD-10-CM | POA: Diagnosis not present

## 2023-02-21 DIAGNOSIS — J449 Chronic obstructive pulmonary disease, unspecified: Secondary | ICD-10-CM | POA: Diagnosis not present

## 2023-02-21 DIAGNOSIS — J309 Allergic rhinitis, unspecified: Secondary | ICD-10-CM | POA: Diagnosis not present

## 2023-02-21 DIAGNOSIS — N1831 Chronic kidney disease, stage 3a: Secondary | ICD-10-CM | POA: Diagnosis not present

## 2023-02-21 DIAGNOSIS — H900 Conductive hearing loss, bilateral: Secondary | ICD-10-CM | POA: Diagnosis not present

## 2023-02-21 DIAGNOSIS — F02A4 Dementia in other diseases classified elsewhere, mild, with anxiety: Secondary | ICD-10-CM | POA: Diagnosis not present

## 2023-02-22 ENCOUNTER — Non-Acute Institutional Stay (SKILLED_NURSING_FACILITY): Payer: Medicare Other | Admitting: Nurse Practitioner

## 2023-02-22 DIAGNOSIS — J4489 Other specified chronic obstructive pulmonary disease: Secondary | ICD-10-CM

## 2023-02-22 DIAGNOSIS — J309 Allergic rhinitis, unspecified: Secondary | ICD-10-CM | POA: Diagnosis not present

## 2023-02-22 DIAGNOSIS — R0789 Other chest pain: Secondary | ICD-10-CM

## 2023-02-22 DIAGNOSIS — M159 Polyosteoarthritis, unspecified: Secondary | ICD-10-CM | POA: Diagnosis not present

## 2023-02-22 DIAGNOSIS — R269 Unspecified abnormalities of gait and mobility: Secondary | ICD-10-CM

## 2023-02-22 DIAGNOSIS — I509 Heart failure, unspecified: Secondary | ICD-10-CM

## 2023-02-22 DIAGNOSIS — H900 Conductive hearing loss, bilateral: Secondary | ICD-10-CM | POA: Diagnosis not present

## 2023-02-22 DIAGNOSIS — I5020 Unspecified systolic (congestive) heart failure: Secondary | ICD-10-CM | POA: Diagnosis not present

## 2023-02-22 DIAGNOSIS — F02A4 Dementia in other diseases classified elsewhere, mild, with anxiety: Secondary | ICD-10-CM | POA: Diagnosis not present

## 2023-02-22 DIAGNOSIS — F5105 Insomnia due to other mental disorder: Secondary | ICD-10-CM

## 2023-02-22 DIAGNOSIS — F419 Anxiety disorder, unspecified: Secondary | ICD-10-CM

## 2023-02-22 DIAGNOSIS — N1831 Chronic kidney disease, stage 3a: Secondary | ICD-10-CM | POA: Diagnosis not present

## 2023-02-22 DIAGNOSIS — J449 Chronic obstructive pulmonary disease, unspecified: Secondary | ICD-10-CM | POA: Diagnosis not present

## 2023-02-22 NOTE — Assessment & Plan Note (Signed)
 Stable,  takes Ambien, Lexapro

## 2023-02-22 NOTE — Assessment & Plan Note (Signed)
 clinically presumed. 08/16/22 Echo mild aortic stenosis, normal left ventricular systolic function, no EF.  BNP in 364 08/12/22>>248 09/21/22, chronic edema BLE, on Furosemide. (CXR showed central pulmonary venous congestion w/o frank pulmonary edema, chronic lung disease, no acute findings of osseous structures)

## 2023-02-22 NOTE — Assessment & Plan Note (Signed)
 Chronic lower back, leg pain,  R shoulder pain, had Ortho eval, takes Lyrica, Tylenol, Norco, Morphine

## 2023-02-22 NOTE — Progress Notes (Signed)
 Location:   SNF [facility] Room Number: 60A Place of Service:  SNF (31) Provider: Larwance Kalin Amrhein NP  Averil Digman X, NP  Patient Care Team: Porsche Noguchi X, NP as PCP - General (Internal Medicine) Marrell Dicaprio X, NP as Nurse Practitioner (Internal Medicine)  Extended Emergency Contact Information Primary Emergency Contact: Meddings,Jeff Home Phone: [phone] Relation: Son Secondary Emergency Contact: Culbreth Sr.,Christopher Home Phone: [phone] Relation: Relative  Code Status: DNR Goals of care: Advanced Directive information    01/07/2023    1:30 PM  Advanced Directives  Does Patient Have a Medical Advance Directive? Yes  Type of Advance Directive Living will;Out of facility DNR (pink MOST or yellow form)  Does patient want to make changes to medical advance directive? No - Patient declined  Pre-existing out of facility DNR order (yellow form or pink MOST form) Yellow form placed in chart (order not valid for inpatient use)     Chief Complaint  Patient presents with   Acute Visit    Anterior right rib cage pain sustained from fall yesterday     HPI:  Pt is a 89 y.o. female seen today for an acute visit for c/o anterior right rib cage under the breat pain with movement, deep breathing and with palpation, resulted in bruises left arm and buttocks. She attributed her right side pain to being hugged too hard when assisted to get off the floor.      Gait abnormality, walker for ambulation,  risk falling             CHF clinically presumed. 08/16/22 Echo mild aortic stenosis, normal left ventricular systolic function, no EF.  BNP in 364 08/12/22>>248 09/21/22, chronic edema BLE, on Furosemide . (CXR showed central pulmonary venous congestion w/o frank pulmonary edema, chronic lung disease, no acute findings of osseous structures)             COPD, intermittent chronic mild expiratory wheezes, c/o SOB, uses O2,  takes Benzonatate , DuoNeb, prn Albuterol /Budsonide, cough is chronic, on  Prednisone  low dose. treated with higher dose of Prednisone  for symptomatic control.  Hx of CVA, CT x2 03/2021 showed no acute findings except age indeterminate right corona radiator infarct, small vessel changes. Neurology recommended no further work ups.              Macular degeneration, f/u [facility]: OU for [place] MD involving central area. OS exudative age related MD             Chronic lower back, leg pain,  R shoulder pain, had Ortho eval, takes Lyrica , Tylenol , Norco, Morphine             HTN, off  Metoprolol .              Urinary symptoms, on  Nitrofurantoin 50mg  qd for UTI suppression, Dr. Ottelin             Anemia, Hx of Hgb 6.8 in hospital s/p 2 units of PRBC, on  Vit B12, Iron, Hgb 9.8 01/11/23<<10.5 01/20/23             CKD Bun/creat 19/1.13 01/20/23             GERD/chronic gastritis/atrophic gastritis, GI bleed 02/2020, s/p EGD, per biopsy, takes PPI,  F/u GI prn, taking Omeprazole .  Fibromyalgia takes Norco, lyrica              Tactile hallucinations, off meds. 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     Fibromyalgia    GERD  12/19/2006   Qualifier: Diagnosis of  By: Latisha CMA, Anette  04/13/16 TSH 1.20, Na 141, K 4.2, Bun 15, creat 0.79, wbc 5.5, Hgb 11.5, plt 283    GERD (gastroesophageal reflux disease)    Hammer toe of second toe of left foot 04/08/2016   Left 2nd   Hearing loss    Hemorrhoids    Hypercholesterolemia    Hypertension    Osteoarthritis 12/19/2006   Qualifier: Diagnosis of  By: Latisha CMA, Leigh     Osteoporosis    Past Surgical History:  Procedure Laterality Date   BIOPSY  02/17/2020   Procedure: BIOPSY;  Surgeon: Charlanne Groom, MD;  Location: [facility];  Service: Endoscopy;;   CATARACT EXTRACTION, BILATERAL  2009   COLONOSCOPY  2009, 2006 , 2017   ESOPHAGOGASTRODUODENOSCOPY (EGD) WITH PROPOFOL  N/A 02/17/2020   Procedure: ESOPHAGOGASTRODUODENOSCOPY (EGD) WITH PROPOFOL ;  Surgeon: Charlanne Groom, MD;  Location: [facility];  Service: Endoscopy;  Laterality: N/A;   IR ANGIOGRAM SELECTIVE EACH ADDITIONAL VESSEL  02/17/2020   IR ANGIOGRAM SELECTIVE EACH ADDITIONAL VESSEL  02/17/2020   IR ANGIOGRAM SELECTIVE EACH ADDITIONAL VESSEL  02/17/2020   IR ANGIOGRAM SELECTIVE EACH ADDITIONAL VESSEL  02/17/2020   IR ANGIOGRAM SELECTIVE EACH ADDITIONAL VESSEL  02/17/2020   IR ANGIOGRAM VISCERAL SELECTIVE  02/17/2020   IR ANGIOGRAM VISCERAL SELECTIVE  02/17/2020   IR US  GUIDE VASC ACCESS RIGHT  02/17/2020   stapes surgery     Dr Thaddeus   TONSILLECTOMY AND ADENOIDECTOMY     as a child    Allergies  Allergen Reactions   Penicillins Anaphylaxis   Bisphosphonates Other (See Comments)    [name]   Influenza Vaccines     Unknown   Simvastatin     Unknown    Trazodone  And Nefazodone     Felt her throat was closing up/kept her awake   Sulfonamide Derivatives Rash and Other (See Comments)    blisters    Allergies as of 02/22/2023       Reactions   Penicillins Anaphylaxis   Bisphosphonates Other (See Comments)   [name]   Influenza Vaccines    Unknown   Simvastatin  Unknown    Trazodone  And  Nefazodone    Felt her throat was closing up/kept her awake   Sulfonamide Derivatives Rash, Other (See Comments)   blisters        Medication List        Accurate as of February 22, 2023 11:59 PM. If you have any questions, ask your nurse or doctor.          acetaminophen  325 MG tablet Commonly known as: TYLENOL  Take 650 mg by mouth every 4 (four) hours as needed for fever.  Give 650 mg by mouth at bedtime   albuterol  108 (90 Base) MCG/ACT inhaler Commonly known as: VENTOLIN  HFA Inhale into the lungs every 6 (six) hours as needed for wheezing or shortness of breath.   Albuterol -Budesonide 90-80 MCG/ACT Aero Inhale 2 puffs into the lungs every 6 (six) hours as needed (shortness of breath/ wheezing).   Antacid 750 MG chewable tablet Generic drug: calcium  carbonate Chew 1 tablet by mouth daily.   benzonatate  100 MG capsule Commonly known as: TESSALON  Take 100 mg by mouth 2 (two) times daily.   budesonide-formoterol 80-4.5 MCG/ACT inhaler Commonly known as: SYMBICORT Inhale 1 puff into the lungs daily.   cholecalciferol 1000 units tablet Commonly known as: VITAMIN D  Take 1,000 Units by mouth daily.   cyanocobalamin  1000 MCG tablet Commonly known as: VITAMIN B12 Take 1,000 mcg by mouth daily.   docusate sodium  100 MG capsule Commonly known as: COLACE Take 100 mg by mouth 2 (two) times daily.   escitalopram 20 MG tablet Commonly known as: LEXAPRO Take 20 mg by mouth at bedtime.   ferrous sulfate 325 (65 FE) MG tablet Take 325 mg by mouth. Once A Day on Mon, Wed, Fri   furosemide  20 MG tablet Commonly known as: LASIX  Take 20 mg by mouth daily.   furosemide  40 MG tablet Commonly known as: LASIX  Take 1 tablet (40 mg total) by mouth daily for 2 days.   HYDROcodone -acetaminophen  5-325 MG tablet Commonly known as: NORCO/VICODIN Take 1 tablet by mouth every 6 (six) hours as needed for moderate pain.   hydrocortisone  cream 1 % Apply 1 application topically 2  (two) times daily as needed (wrists).   hydrocortisone  valerate cream 0.2 % Commonly known as: WESTCORT  Apply 1 application topically 2 (two) times daily as needed (for itching). Cleanse behind ears with gentle cleanser and dry completely.   ipratropium-albuterol  0.5-2.5 (3) MG/3ML Soln Commonly known as: DUONEB Take 3 mLs by nebulization daily.   ipratropium-albuterol  0.5-2.5 (3) MG/3ML Soln Commonly known as: DUONEB Take 3 mLs by nebulization in the morning and at bedtime. 1 vial inhale orally every 24 hours as needed for Wheezing   ketoconazole 2 % cream Commonly known as: NIZORAL Apply 1 application  topically 2 (two) times daily as needed for irritation.   LORazepam  0.5 MG tablet Commonly known as: ATIVAN  Take 0.5 mg by mouth every 4 (four) hours as needed for anxiety.   methocarbamol 500 MG tablet Commonly known as: ROBAXIN Take 500 mg by mouth every 6 (six) hours as needed for muscle spasms.   nitrofurantoin 50 MG capsule Commonly known as: MACRODANTIN Take 50 mg by mouth at bedtime.   Olopatadine HCl 0.2 % Soln Place 1 drop into both eyes daily.   polyethylene glycol 17 g packet Commonly known as: MIRALAX  / GLYCOLAX  Take 17 g by mouth 2 (two) times a week. Sunday and Wednesday   potassium chloride  20 MEQ packet Commonly known as: KLOR-CON  Take 20  mEq by mouth 2 (two) times daily.   predniSONE  5 MG tablet Commonly known as: DELTASONE  Take 5 mg by mouth daily with breakfast.   pregabalin  75 MG capsule Commonly known as: LYRICA  Take 1 capsule (75 mg total) by mouth at bedtime.   Sarna Sensitive 1 % Lotn Generic drug: pramoxine Apply to cheeks topically one time a day for rosy burning cheeks   Systane Complete 0.6 % Soln Generic drug: Propylene Glycol Instill 1 drop in both eyes four times a day for DRY EYE   Voltaren 1 % Gel Generic drug: diclofenac Sodium Apply 2 g topically every 8 (eight) hours as needed.   zolpidem  5 MG tablet Commonly known as:  Ambien  Take 1 tablet (5 mg total) by mouth at bedtime.        Review of Systems  Constitutional:  Negative for appetite change, fatigue and fever.  HENT:  Positive for hearing loss. Negative for congestion and voice change.   Eyes:  Negative for visual disturbance.       C/o blurred vision, ? Color, ? Right eye peripheral vision loss-f/u Ophthalmology  Respiratory:  Positive for cough and shortness of breath. Negative for chest tightness and wheezing.        DOE, chronic hacking cough  Cardiovascular:  Positive for chest pain and leg swelling. Negative for palpitations.  Gastrointestinal:  Negative for abdominal pain, diarrhea, nausea and vomiting.  Genitourinary:  Negative for dysuria and urgency.       Chronic, on and off  Musculoskeletal:  Positive for arthralgias, back pain, gait problem and myalgias. Negative for joint swelling.       Anterior right sided rib cage under the breast pain with movement, deep breathing, and palpation.   Skin:  Positive for wound.  Neurological:  Negative for speech difficulty, weakness and light-headedness.  Psychiatric/Behavioral:  Negative for behavioral problems, hallucinations and sleep disturbance.        Chronic going to bed late, sleeping in late.     Immunization History  Administered Date(s) Administered   Moderna SARS-COV2 Booster Vaccination 12/25/2019   Moderna Sars-Covid-2 Vaccination 02/17/2019, 03/17/2019   Pneumococcal Conjugate-13 09/01/2015   Pneumococcal Polysaccharide-23 07/07/1998   Td 08/21/2009   Tdap 09/22/2022   Pertinent  Health Maintenance Due  Topic Date Due   DEXA SCAN  Completed   INFLUENZA VACCINE  Discontinued      03/20/2021   12:00 AM 03/20/2021   12:00 PM 02/05/2022    9:16 AM 03/09/2022   10:01 AM 07/23/2022    3:44 PM  Fall Risk  Falls in the past year?   0 0 0  Was there an injury with Fall?   0 0 0  Fall Risk Category Calculator   0 0 0  Fall Risk Category (Retired)   Low    (RETIRED) Patient Fall  Risk Level High fall risk High fall risk High fall risk    Patient at Risk for Falls Due to   History of fall(s) No Fall Risks No Fall Risks  Fall risk Follow up   Falls evaluation completed Falls evaluation completed Falls evaluation completed   Functional Status Survey:    Vitals:   02/22/23 1554  BP: (!) 145/55  Pulse: 76  Resp: 18  Temp: (!) 97.3 F (36.3 C)  SpO2: 98%  Weight: 159 lb 8 oz (72.3 kg)   Body mass index is 31.15 kg/m. Physical Exam Vitals and nursing note reviewed.  Constitutional:      Appearance:  Normal appearance.  HENT:     Head: Normocephalic and atraumatic.     Nose: Nose normal.     Mouth/Throat:     Mouth: Mucous membranes are moist.  Eyes:     Extraocular Movements: Extraocular movements intact.     Conjunctiva/sclera: Conjunctivae normal.     Pupils: Pupils are equal, round, and reactive to light.     Comments: Able to count my fingers 3 feet away from her eyes.   Cardiovascular:     Rate and Rhythm: Normal rate and regular rhythm.     Heart sounds: No murmur heard. Pulmonary:     Effort: Pulmonary effort is normal.     Breath sounds: Rales present. No wheezing.     Comments: Decreased air entry to both lungs. Needs O2 via [place] to maintain SatO2>90, rales bibasilar.  Abdominal:     General: Bowel sounds are normal.     Palpations: Abdomen is soft.     Tenderness: There is no abdominal tenderness. There is no right CVA tenderness.  Musculoskeletal:        General: Tenderness present.     Cervical back: Normal range of motion and neck supple.     Right lower leg: Edema present.     Left lower leg: Edema present.     Comments: mild edema BLE. R shoulder pain with overhead ROM.   Anterior right sided rib cage under the breast pain with movement, deep breathing, and palpation.    Skin:    General: Skin is warm and dry.     Findings: Bruising and erythema present.     Comments: The lower left leg redness, warmth, swelling. Stasis ulcer with  yellow slough in the previous skin tear area Improved.  Left arm, buttocks bruises.    Neurological:     General: No focal deficit present.     Mental Status: She is alert. Mental status is at baseline.     Motor: No weakness.     Coordination: Coordination normal.     Gait: Gait abnormal.     Comments: Oriented to person, place.   Psychiatric:        Mood and Affect: Mood normal.        Behavior: Behavior normal.     Labs reviewed: Recent Labs    07/31/22 1448 08/13/22 0000 08/24/22 0000 11/09/22 0000  NA 137 142 141 139  K 3.6 3.1* 4.1 3.7  CL 100 101 103 98*  CO2 27 29* 31* 32*  GLUCOSE 112*  --   --   --   BUN 16 22* 18 20  CREATININE 1.25* 1.2* 1.1 1.1  CALCIUM  9.1 8.8 8.8 9.0   Recent Labs    07/31/22 1448  AST 20  ALT 19  ALKPHOS 78  BILITOT 1.2  PROT 6.1*  ALBUMIN 3.6   Recent Labs    07/27/22 0000 07/31/22 1448  WBC 8.1 7.0  HGB 12.3 13.3  HCT 37 41.6  MCV  --  95.2  PLT 194 179   Lab Results  Component Value Date   TSH 1.03 08/13/2022   Lab Results  Component Value Date   HGBA1C 6.6 08/13/2022   Lab Results  Component Value Date   CHOL 192 03/19/2021   HDL 69 03/19/2021   LDLCALC 107 (H) 03/19/2021   LDLDIRECT 114.7 08/18/2011   TRIG 78 03/19/2021   CHOLHDL 2.8 03/19/2021    Significant Diagnostic Results in last 30 days:  No results found.  Assessment/Plan: Right-sided chest wall pain c/o anterior right rib cage under the breat pain with movement, deep breathing and with palpitation, resulted in bruises left arm and buttocks. She attributed her right side pain to being hugged too hard when assisted to get off the floor.  X-ray R ribs ap/lateral to r/o fxs   Gait abnormality Gait abnormality, walker for ambulation,  risk falling  Congestive heart failure (CHF) (HCC) clinically presumed. 08/16/22 Echo mild aortic stenosis, normal left ventricular systolic function, no EF.  BNP in 364 08/12/22>>248 09/21/22, chronic edema BLE, on  Furosemide . (CXR showed central pulmonary venous congestion w/o frank pulmonary edema, chronic lung disease, no acute findings of osseous structures)  COPD (chronic obstructive pulmonary disease) with chronic bronchitis  intermittent chronic mild expiratory wheezes, c/o SOB, uses O2,  takes Benzonatate , DuoNeb, prn Albuterol /Budsonide, cough is chronic, on Prednisone  low dose. treated with higher dose of Prednisone  for symptomatic control.   Osteoarthritis  Chronic lower back, leg pain,  R shoulder pain, had Ortho eval, takes Lyrica , Tylenol , Norco, Morphine  Insomnia secondary to anxiety Stable,  takes Ambien , Lexapro     Family/ staff Communication: plan of care reviewed with the patient and charge nurse.   Labs/tests ordered:  X-ray R ribs ap/lateral.  Time spend 30 minutes.

## 2023-02-22 NOTE — Assessment & Plan Note (Signed)
 Gait abnormality, walker for ambulation,  risk falling

## 2023-02-22 NOTE — Assessment & Plan Note (Signed)
 c/o anterior right rib cage under the breat pain with movement, deep breathing and with palpitation, resulted in bruises left arm and buttocks. She attributed her right side pain to being hugged too hard when assisted to get off the floor.  X-ray R ribs ap/lateral to r/o fxs

## 2023-02-22 NOTE — Assessment & Plan Note (Signed)
 intermittent chronic mild expiratory wheezes, c/o SOB, uses O2,  takes Benzonatate, DuoNeb, prn Albuterol/Budsonide, cough is chronic, on Prednisone low dose. treated with higher dose of Prednisone for symptomatic control.

## 2023-02-23 ENCOUNTER — Other Ambulatory Visit: Payer: Self-pay | Admitting: Adult Health

## 2023-02-23 DIAGNOSIS — F419 Anxiety disorder, unspecified: Secondary | ICD-10-CM

## 2023-02-23 MED ORDER — ZOLPIDEM TARTRATE 5 MG PO TABS: 5.0000 mg | ORAL_TABLET | Freq: Every day | ORAL | 0 refills | Status: DC

## 2023-02-24 ENCOUNTER — Encounter: Payer: Self-pay | Admitting: Nurse Practitioner

## 2023-02-25 DIAGNOSIS — I5020 Unspecified systolic (congestive) heart failure: Secondary | ICD-10-CM | POA: Diagnosis not present

## 2023-02-25 DIAGNOSIS — J309 Allergic rhinitis, unspecified: Secondary | ICD-10-CM | POA: Diagnosis not present

## 2023-02-25 DIAGNOSIS — H900 Conductive hearing loss, bilateral: Secondary | ICD-10-CM | POA: Diagnosis not present

## 2023-02-25 DIAGNOSIS — J449 Chronic obstructive pulmonary disease, unspecified: Secondary | ICD-10-CM | POA: Diagnosis not present

## 2023-02-25 DIAGNOSIS — N1831 Chronic kidney disease, stage 3a: Secondary | ICD-10-CM | POA: Diagnosis not present

## 2023-02-25 DIAGNOSIS — F02A4 Dementia in other diseases classified elsewhere, mild, with anxiety: Secondary | ICD-10-CM | POA: Diagnosis not present

## 2023-03-01 DIAGNOSIS — N1831 Chronic kidney disease, stage 3a: Secondary | ICD-10-CM | POA: Diagnosis not present

## 2023-03-01 DIAGNOSIS — J449 Chronic obstructive pulmonary disease, unspecified: Secondary | ICD-10-CM | POA: Diagnosis not present

## 2023-03-01 DIAGNOSIS — J309 Allergic rhinitis, unspecified: Secondary | ICD-10-CM | POA: Diagnosis not present

## 2023-03-01 DIAGNOSIS — I5020 Unspecified systolic (congestive) heart failure: Secondary | ICD-10-CM | POA: Diagnosis not present

## 2023-03-01 DIAGNOSIS — F02A4 Dementia in other diseases classified elsewhere, mild, with anxiety: Secondary | ICD-10-CM | POA: Diagnosis not present

## 2023-03-01 DIAGNOSIS — H900 Conductive hearing loss, bilateral: Secondary | ICD-10-CM | POA: Diagnosis not present

## 2023-03-03 DIAGNOSIS — F02A4 Dementia in other diseases classified elsewhere, mild, with anxiety: Secondary | ICD-10-CM | POA: Diagnosis not present

## 2023-03-03 DIAGNOSIS — N1831 Chronic kidney disease, stage 3a: Secondary | ICD-10-CM | POA: Diagnosis not present

## 2023-03-03 DIAGNOSIS — H900 Conductive hearing loss, bilateral: Secondary | ICD-10-CM | POA: Diagnosis not present

## 2023-03-03 DIAGNOSIS — I5020 Unspecified systolic (congestive) heart failure: Secondary | ICD-10-CM | POA: Diagnosis not present

## 2023-03-03 DIAGNOSIS — J449 Chronic obstructive pulmonary disease, unspecified: Secondary | ICD-10-CM | POA: Diagnosis not present

## 2023-03-03 DIAGNOSIS — J309 Allergic rhinitis, unspecified: Secondary | ICD-10-CM | POA: Diagnosis not present

## 2023-03-04 DIAGNOSIS — H900 Conductive hearing loss, bilateral: Secondary | ICD-10-CM | POA: Diagnosis not present

## 2023-03-04 DIAGNOSIS — N1831 Chronic kidney disease, stage 3a: Secondary | ICD-10-CM | POA: Diagnosis not present

## 2023-03-04 DIAGNOSIS — J309 Allergic rhinitis, unspecified: Secondary | ICD-10-CM | POA: Diagnosis not present

## 2023-03-04 DIAGNOSIS — F02A4 Dementia in other diseases classified elsewhere, mild, with anxiety: Secondary | ICD-10-CM | POA: Diagnosis not present

## 2023-03-04 DIAGNOSIS — I5020 Unspecified systolic (congestive) heart failure: Secondary | ICD-10-CM | POA: Diagnosis not present

## 2023-03-04 DIAGNOSIS — J449 Chronic obstructive pulmonary disease, unspecified: Secondary | ICD-10-CM | POA: Diagnosis not present

## 2023-03-07 ENCOUNTER — Encounter: Payer: Self-pay | Admitting: Sports Medicine

## 2023-03-07 ENCOUNTER — Non-Acute Institutional Stay (SKILLED_NURSING_FACILITY): Payer: Self-pay | Admitting: Sports Medicine

## 2023-03-07 DIAGNOSIS — K5901 Slow transit constipation: Secondary | ICD-10-CM

## 2023-03-07 DIAGNOSIS — J4489 Other specified chronic obstructive pulmonary disease: Secondary | ICD-10-CM

## 2023-03-07 DIAGNOSIS — J309 Allergic rhinitis, unspecified: Secondary | ICD-10-CM | POA: Diagnosis not present

## 2023-03-07 DIAGNOSIS — H900 Conductive hearing loss, bilateral: Secondary | ICD-10-CM | POA: Diagnosis not present

## 2023-03-07 DIAGNOSIS — M797 Fibromyalgia: Secondary | ICD-10-CM

## 2023-03-07 DIAGNOSIS — J449 Chronic obstructive pulmonary disease, unspecified: Secondary | ICD-10-CM | POA: Diagnosis not present

## 2023-03-07 DIAGNOSIS — F5101 Primary insomnia: Secondary | ICD-10-CM | POA: Diagnosis not present

## 2023-03-07 DIAGNOSIS — F02A4 Dementia in other diseases classified elsewhere, mild, with anxiety: Secondary | ICD-10-CM | POA: Diagnosis not present

## 2023-03-07 DIAGNOSIS — K219 Gastro-esophageal reflux disease without esophagitis: Secondary | ICD-10-CM | POA: Diagnosis not present

## 2023-03-07 DIAGNOSIS — F411 Generalized anxiety disorder: Secondary | ICD-10-CM

## 2023-03-07 DIAGNOSIS — I509 Heart failure, unspecified: Secondary | ICD-10-CM | POA: Diagnosis not present

## 2023-03-07 DIAGNOSIS — M159 Polyosteoarthritis, unspecified: Secondary | ICD-10-CM | POA: Diagnosis not present

## 2023-03-07 DIAGNOSIS — Z515 Encounter for palliative care: Secondary | ICD-10-CM | POA: Diagnosis not present

## 2023-03-07 DIAGNOSIS — N1831 Chronic kidney disease, stage 3a: Secondary | ICD-10-CM | POA: Diagnosis not present

## 2023-03-07 DIAGNOSIS — I5020 Unspecified systolic (congestive) heart failure: Secondary | ICD-10-CM | POA: Diagnosis not present

## 2023-03-07 MED ORDER — BENZONATATE 100 MG PO CAPS: 100.0000 mg | ORAL_CAPSULE | Freq: Two times a day (BID) | ORAL | Status: DC | PRN

## 2023-03-07 NOTE — Progress Notes (Signed)
Provider:  Venita Sheffield MD Location:   [facility]   Place of Service:   Skilled care  PCP: Mast, Man X, NP Patient Care Team: Mast, Man X, NP as PCP - General (Internal Medicine) Mast, Man X, NP as Nurse Practitioner (Internal Medicine)  Extended Emergency Contact Information Primary Emergency Contact: Betty,Jeff Home Phone: [phone] Relation: Son Secondary Emergency Contact: Culbreth Sr.,Christopher Home Phone: [phone] Relation: Relative  Code Status:  Goals of Care: Advanced Directive information    01/07/2023    1:30 PM  Advanced Directives  Does Patient Have a Medical Advance Directive? Yes  Type of Advance Directive Living will;Out of facility DNR (pink MOST or yellow form)  Does patient want to make changes to medical advance directive? No - Patient declined  Pre-existing out of facility DNR order (yellow form or pink MOST form) Yellow form placed in chart (order not valid for inpatient use)                                       Hospice related Visit    No chief complaint on file.   HPI: Patient is a 89 y.o. female seen today for routine visit for follow up on chronic medical problems. Pt seen and examined in her room, she is sitting in wheelchair,  She is on supplemental oxygen  Able to speak in full sentences and does not appear to be in distress Pt knows her name, oriented to time and place. Can tell me what she had for breakfast. As per nursing staff pt requires 1 person assistance with ADLS. She consumes about 75% of her meals She had a fall about 2 weeks ago, denies pain Has a skin tear on left lower leg. Ambulates with wheelchair for long distances.  Hypoxic respiratory failure C/o COPD CHF Pt denies cough  Has exertional SOB but denies no recent change Denies lower extremity swelling Denies orthopnea, chest pain    OA multiple sites Denies pain currently  On norco , morphine prn    COMPREHENSIVE METABOLIC  PANEL GLUCOSE 128 mg/dL 29-56 H Final  Fasting reference interval For someone without known diabetes, a glucose value >125 mg/dL indicates that they may have diabetes and this should be confirmed with a follow-up test. UREA NITROGEN (BUN) 19 mg/dL 2-13 Final CREATININE 0.86 mg/dL 5.78-4.69 H Final EGFR 47 mL/min/1.73 m2 > OR = 60 L Final BUN/CREATININE RATIO 17 (calc) 6-22 Final SODIUM 139 mmol/L 135-146 Final POTASSIUM 3.6 mmol/L 3.5-5.3 Final CHLORIDE 98 mmol/L 98-110 Final CARBON DIOXIDE 33 mmol/L 20-32 H Final CALCIUM 9.1 mg/dL 6.2-95.2 Final PROTEIN, TOTAL 5.8 g/dL 8.4-1.3 L Final ALBUMIN 3.7 g/dL 2.4-4.0 Final GLOBULIN 2.1 g/dL (calc) 1.0-2.7 Final ALBUMIN/GLOBULIN RATIO 1.8 (calc) 1.0-2.5 Final BILIRUBIN, TOTAL 0.6 mg/dL 2.5-3.6 Final ALKALINE PHOSPHATASE 117 U/L 37-153 Final AST 12 U/L 10-35 Final ALT 12 U/L 6-29 Final   WHITE BLOOD CELL COUNT 7.5 Thousand/u L 3.8-10.8 Final RED BLOOD CELL COUNT 3.59 Million/uL 3.80-5.10 L Final HEMOGLOBIN 10.5 g/dL 64.4-03.4 L Final HEMATOCRIT 32.5 % 35.0-45.0 L Final MCV 90.5 fL 80.0-100.0 Final MCH 29.2 pg 27.0-33.0 Final MCHC 32.3 g/dL 74.2-59.5 Final For adults, a slight decrease in the calculated MCHC value (in the range of 30 to 32 g/dL) is most likely not clinically significant; however, it should be interpreted with caution in correlation with other red cell parameters and the patient's clinical condition. 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     Fibromyalgia    GERD 12/19/2006   Qualifier: Diagnosis of  By: Renaldo Fiddler CMA, Marliss Czar  04/13/16 TSH 1.20, Na 141, K 4.2, Bun 15, creat 0.79, wbc 5.5, Hgb 11.5, plt 283    GERD (gastroesophageal reflux disease)    Hammer toe of second toe of left foot 04/08/2016   Left 2nd   Hearing loss    Hemorrhoids    Hypercholesterolemia    Hypertension    Osteoarthritis 12/19/2006   Qualifier: Diagnosis of  By: Renaldo Fiddler CMA, Leigh     Osteoporosis    Past Surgical History:  Procedure Laterality Date   BIOPSY  02/17/2020   Procedure: BIOPSY;  Surgeon: Lynann Bologna, MD;  Location: [facility];  Service: Endoscopy;;   CATARACT EXTRACTION, BILATERAL  2009   COLONOSCOPY  2009, 2006 , 2017   ESOPHAGOGASTRODUODENOSCOPY (EGD) WITH PROPOFOL N/A 02/17/2020   Procedure: ESOPHAGOGASTRODUODENOSCOPY (EGD) WITH PROPOFOL;  Surgeon: Lynann Bologna, MD;  Location: [facility];  Service: Endoscopy;  Laterality: N/A;   IR ANGIOGRAM SELECTIVE EACH ADDITIONAL VESSEL  02/17/2020   IR ANGIOGRAM SELECTIVE EACH ADDITIONAL VESSEL  02/17/2020   IR ANGIOGRAM SELECTIVE EACH ADDITIONAL VESSEL  02/17/2020   IR ANGIOGRAM SELECTIVE EACH ADDITIONAL VESSEL  02/17/2020   IR ANGIOGRAM SELECTIVE EACH ADDITIONAL VESSEL  02/17/2020   IR ANGIOGRAM VISCERAL SELECTIVE  02/17/2020   IR ANGIOGRAM VISCERAL SELECTIVE  02/17/2020   IR US GUIDE VASC ACCESS RIGHT  02/17/2020   stapes surgery     Dr Dorma Russell   TONSILLECTOMY AND ADENOIDECTOMY     as a child    reports that she quit smoking about 13 years ago. Her smoking use included cigarettes. She has never used smokeless tobacco. She reports current alcohol use. She reports that she does not use drugs. Social History   Socioeconomic History   Marital status:  Widowed    Spouse name: Not on file   Number of children: Not on file   Years of education: Not on file   Highest education level: Not on file  Occupational History   Occupation: retired   Occupation: works some weekends at [employer]  Tobacco Use   Smoking status: Former    Current packs/day: 0.00    Types: Cigarettes    Quit date: 02/15/2010    Years since quitting: 13.0   Smokeless tobacco: Never  Vaping Use   Vaping status: Never Used  Substance and Sexual Activity   Alcohol use: Yes    Comment: occasional glass of wine   Drug use: No   Sexual activity: Never  Other Topics Concern   Not  on file  Social History Narrative   Pt works some weekends at [employer]   Caffeine use - daily coffee in the mornings   Patient gets regular exercise > tries to walk daily for exercise   Moved [facility] 03/11/16   Widowed   Former Smoker-stopped 2012   Alcohol occasionally glass of wine   Living Will   Social Drivers of Corporate investment banker Strain: Low Risk  (11/08/2017)   Overall Financial Resource Strain (CARDIA)    Difficulty of Paying Living Expenses: Not hard at all  Food Insecurity: No Food Insecurity (11/08/2017)   Hunger Vital Sign    Worried About Running Out of Food in the Last Year: Never true    Ran Out of Food in the Last Year: Never true  Transportation Needs: No Transportation Needs (11/08/2017)   PRAPARE - Administrator, Civil Service (Medical): No    Lack of Transportation (Non-Medical): No  Physical Activity: Sufficiently Active (11/08/2017)   Exercise Vital Sign    Days of Exercise per Week: 5 days    Minutes of Exercise per Session: 30 min  Stress: No Stress Concern Present (11/08/2017)   [employer] of Occupational Health - Occupational Stress Questionnaire    Feeling of Stress : Not at all  Social Connections: Moderately Isolated (11/08/2017)   Social Connection and Isolation Panel [NHANES]    Frequency of  Communication with Friends and Family: More than three times a week    Frequency of Social Gatherings with Friends and Family: Twice a week    Attends Religious Services: Never    Database administrator or Organizations: No    Attends Banker Meetings: Never    Marital Status: Widowed  Intimate Partner Violence: Not At Risk (11/08/2017)   Humiliation, Afraid, Rape, and Kick questionnaire    Fear of Current or Ex-Partner: No    Emotionally Abused: No    Physically Abused: No    Sexually Abused: No    Functional Status Survey:    Family History  Problem Relation Age of Onset   Heart disease Father    COPD Father    Hypertension Mother    Arthritis Mother     Health Maintenance  Topic Date Due   Pneumonia Vaccine 57+ Years old (3 of 3 - PPSV23 or PCV20) 10/27/2015   DTaP/Tdap/Td (3 - Td or Tdap) 09/21/2032   DEXA SCAN  Completed   HPV VACCINES  Aged Out   INFLUENZA VACCINE  Discontinued   COVID-19 Vaccine  Discontinued   Zoster Vaccines- Shingrix  Discontinued    Allergies  Allergen Reactions   Penicillins Anaphylaxis   Bisphosphonates Other (See Comments)    [name]   Influenza Vaccines     Unknown   Simvastatin     Unknown    Trazodone And Nefazodone     Felt her throat was closing up/kept her awake   Sulfonamide Derivatives Rash and Other (See Comments)    blisters    Outpatient Encounter Medications as of 03/07/2023  Medication Sig   acetaminophen (TYLENOL) 325 MG tablet Take 650 mg by mouth every 4 (four) hours as needed for fever.  Give 650 mg by mouth at bedtime   albuterol (VENTOLIN HFA) 108 (90 Base) MCG/ACT inhaler Inhale into the lungs every 6 (six) hours as needed for wheezing or shortness of breath.   Albuterol-Budesonide 90-80 MCG/ACT AERO Inhale 2 puffs into the lungs every  6 (six) hours as needed (shortness of breath/ wheezing).   benzonatate (TESSALON) 100 MG capsule Take 100 mg by mouth 2 (two) times daily.    budesonide-formoterol (SYMBICORT) 80-4.5 MCG/ACT inhaler Inhale 1 puff into the lungs daily.   calcium carbonate (ANTACID) 750 MG chewable tablet Chew 1 tablet by mouth daily.   cholecalciferol (VITAMIN D) 1000 UNITS tablet Take 1,000 Units by mouth daily.   diclofenac Sodium (VOLTAREN) 1 % GEL Apply 2 g topically every 8 (eight) hours as needed.   docusate sodium (COLACE) 100 MG capsule Take 100 mg by mouth 2 (two) times daily.   escitalopram (LEXAPRO) 20 MG tablet Take 20 mg by mouth at bedtime.   ferrous sulfate 325 (65 FE) MG tablet Take 325 mg by mouth. Once A Day on Mon, Wed, Fri   furosemide (LASIX) 20 MG tablet Take 20 mg by mouth daily.   furosemide (LASIX) 40 MG tablet Take 1 tablet (40 mg total) by mouth daily for 2 days.   HYDROcodone-acetaminophen (NORCO/VICODIN) 5-325 MG tablet Take 1 tablet by mouth every 6 (six) hours as needed for moderate pain.   hydrocortisone cream 1 % Apply 1 application topically 2 (two) times daily as needed (wrists).   hydrocortisone valerate cream (WESTCORT) 0.2 % Apply 1 application topically 2 (two) times daily as needed (for itching). Cleanse behind ears with gentle cleanser and dry completely.   ipratropium-albuterol (DUONEB) 0.5-2.5 (3) MG/3ML SOLN Take 3 mLs by nebulization daily.   ipratropium-albuterol (DUONEB) 0.5-2.5 (3) MG/3ML SOLN Take 3 mLs by nebulization in the morning and at bedtime. 1 vial inhale orally every 24 hours as needed for Wheezing   ketoconazole (NIZORAL) 2 % cream Apply 1 application  topically 2 (two) times daily as needed for irritation.   LORazepam (ATIVAN) 0.5 MG tablet Take 0.5 mg by mouth every 4 (four) hours as needed for anxiety. (Patient not taking: Reported on 12/29/2022)   methocarbamol (ROBAXIN) 500 MG tablet Take 500 mg by mouth every 6 (six) hours as needed for muscle spasms. (Patient not taking: Reported on 11/25/2022)   nitrofurantoin (MACRODANTIN) 50 MG capsule Take 50 mg by mouth at bedtime.   Olopatadine HCl  0.2 % SOLN Place 1 drop into both eyes daily.   polyethylene glycol (MIRALAX / GLYCOLAX) 17 g packet Take 17 g by mouth 2 (two) times a week. Sunday and Wednesday   potassium chloride (KLOR-CON) 20 MEQ packet Take 20 mEq by mouth 2 (two) times daily.   pramoxine (SARNA SENSITIVE) 1 % LOTN Apply to cheeks topically one time a day for rosy burning cheeks   predniSONE (DELTASONE) 5 MG tablet Take 5 mg by mouth daily with breakfast.   pregabalin (LYRICA) 75 MG capsule Take 1 capsule (75 mg total) by mouth at bedtime.   Propylene Glycol (SYSTANE COMPLETE) 0.6 % SOLN Instill 1 drop in both eyes four times a day for DRY EYE   vitamin B-12 (CYANOCOBALAMIN) 1000 MCG tablet Take 1,000 mcg by mouth daily.   zolpidem (AMBIEN) 5 MG tablet Take 1 tablet (5 mg total) by mouth at bedtime.   No facility-administered encounter medications on file as of 03/07/2023.    Review of Systems  Constitutional:  Negative for fever.  HENT:  Negative for sinus pressure and sore throat.   Respiratory:  Negative for cough, shortness of breath and wheezing.   Cardiovascular:  Negative for chest pain, palpitations and leg swelling.  Gastrointestinal:  Negative for abdominal distention, abdominal pain, blood in stool, constipation, diarrhea,  nausea and vomiting.  Genitourinary:  Negative for dysuria, frequency and urgency.  Neurological:  Negative for dizziness, weakness and numbness.  Psychiatric/Behavioral:  Negative for confusion.     There were no vitals filed for this visit. There is no height or weight on file to calculate BMI. Physical Exam  Labs reviewed: Basic Metabolic Panel: Recent Labs    07/31/22 1448 08/13/22 0000 08/24/22 0000 11/09/22 0000  NA 137 142 141 139  K 3.6 3.1* 4.1 3.7  CL 100 101 103 98*  CO2 27 29* 31* 32*  GLUCOSE 112*  --   --   --   BUN 16 22* 18 20  CREATININE 1.25* 1.2* 1.1 1.1  CALCIUM 9.1 8.8 8.8 9.0   Liver Function Tests: Recent Labs    07/31/22 1448  AST 20  ALT 19   ALKPHOS 78  BILITOT 1.2  PROT 6.1*  ALBUMIN 3.6   No results for input(s): "LIPASE", "AMYLASE" in the last 8760 hours. No results for input(s): "AMMONIA" in the last 8760 hours. CBC: Recent Labs    07/27/22 0000 07/31/22 1448  WBC 8.1 7.0  HGB 12.3 13.3  HCT 37 41.6  MCV  --  95.2  PLT 194 179   Cardiac Enzymes: No results for input(s): "CKTOTAL", "CKMB", "CKMBINDEX", "TROPONINI" in the last 8760 hours. BNP: Invalid input(s): "POCBNP" Lab Results  Component Value Date   HGBA1C 6.6 08/13/2022   Lab Results  Component Value Date   TSH 1.03 08/13/2022   Lab Results  Component Value Date   VITAMINB12 1,441 (H) 03/19/2021   No results found for: "FOLATE" Lab Results  Component Value Date   IRON 59 01/10/2019   TIBC 281 01/10/2019   FERRITIN 46 01/10/2019    Imaging and Procedures obtained prior to SNF admission: DG Chest Port 1 View Result Date: 07/31/2022 CLINICAL DATA:  Shortness of breath. EXAM: PORTABLE CHEST 1 VIEW COMPARISON:  02/13/2016 FINDINGS: The heart size and mediastinal contours are within normal limits. Aortic atherosclerotic calcification incidentally noted. Both lungs are clear. The visualized skeletal structures are unremarkable. IMPRESSION: No active disease. Electronically Signed   By: Danae Orleans M.D.   On: 07/31/2022 15:42    Assessment/Plan  Hospice care patient  Fibromyalgia Osteoarthritis  Denies pain  Cont with supportive care Cont with morphine, norco prn    Constipation  Cont with miralax   COPD  On supplemental o2 No wheezing on exam Pt denies coug  Will change tessalon to prn  Cont with symbicort  Cont with atrovent prn for wheezing / sob   CHF No wheezing  Labs reviewed Cont with lasix   Insomnia Cont with ambien   Depression / GAD  Doing fine Cont with lexapro   GERD  Denies heart burn, Cont with omeprazole    Careplan discussed with the nursing staff    30 min Total time spent for obtaining history,   performing a medically appropriate examination and evaluation, reviewing the tests,   documenting clinical information in the electronic or other health record, ,care coordination (not separately reported)

## 2023-03-08 DIAGNOSIS — H900 Conductive hearing loss, bilateral: Secondary | ICD-10-CM | POA: Diagnosis not present

## 2023-03-08 DIAGNOSIS — F02A4 Dementia in other diseases classified elsewhere, mild, with anxiety: Secondary | ICD-10-CM | POA: Diagnosis not present

## 2023-03-08 DIAGNOSIS — I5020 Unspecified systolic (congestive) heart failure: Secondary | ICD-10-CM | POA: Diagnosis not present

## 2023-03-08 DIAGNOSIS — J449 Chronic obstructive pulmonary disease, unspecified: Secondary | ICD-10-CM | POA: Diagnosis not present

## 2023-03-08 DIAGNOSIS — N1831 Chronic kidney disease, stage 3a: Secondary | ICD-10-CM | POA: Diagnosis not present

## 2023-03-08 DIAGNOSIS — J309 Allergic rhinitis, unspecified: Secondary | ICD-10-CM | POA: Diagnosis not present

## 2023-03-11 DIAGNOSIS — I5020 Unspecified systolic (congestive) heart failure: Secondary | ICD-10-CM | POA: Diagnosis not present

## 2023-03-11 DIAGNOSIS — H900 Conductive hearing loss, bilateral: Secondary | ICD-10-CM | POA: Diagnosis not present

## 2023-03-11 DIAGNOSIS — N1831 Chronic kidney disease, stage 3a: Secondary | ICD-10-CM | POA: Diagnosis not present

## 2023-03-11 DIAGNOSIS — F02A4 Dementia in other diseases classified elsewhere, mild, with anxiety: Secondary | ICD-10-CM | POA: Diagnosis not present

## 2023-03-11 DIAGNOSIS — J309 Allergic rhinitis, unspecified: Secondary | ICD-10-CM | POA: Diagnosis not present

## 2023-03-11 DIAGNOSIS — J449 Chronic obstructive pulmonary disease, unspecified: Secondary | ICD-10-CM | POA: Diagnosis not present

## 2023-03-15 DIAGNOSIS — F02A4 Dementia in other diseases classified elsewhere, mild, with anxiety: Secondary | ICD-10-CM | POA: Diagnosis not present

## 2023-03-15 DIAGNOSIS — I5020 Unspecified systolic (congestive) heart failure: Secondary | ICD-10-CM | POA: Diagnosis not present

## 2023-03-15 DIAGNOSIS — N1831 Chronic kidney disease, stage 3a: Secondary | ICD-10-CM | POA: Diagnosis not present

## 2023-03-15 DIAGNOSIS — J449 Chronic obstructive pulmonary disease, unspecified: Secondary | ICD-10-CM | POA: Diagnosis not present

## 2023-03-15 DIAGNOSIS — J309 Allergic rhinitis, unspecified: Secondary | ICD-10-CM | POA: Diagnosis not present

## 2023-03-15 DIAGNOSIS — H900 Conductive hearing loss, bilateral: Secondary | ICD-10-CM | POA: Diagnosis not present

## 2023-03-18 DIAGNOSIS — I5020 Unspecified systolic (congestive) heart failure: Secondary | ICD-10-CM | POA: Diagnosis not present

## 2023-03-18 DIAGNOSIS — J309 Allergic rhinitis, unspecified: Secondary | ICD-10-CM | POA: Diagnosis not present

## 2023-03-18 DIAGNOSIS — H900 Conductive hearing loss, bilateral: Secondary | ICD-10-CM | POA: Diagnosis not present

## 2023-03-18 DIAGNOSIS — N1831 Chronic kidney disease, stage 3a: Secondary | ICD-10-CM | POA: Diagnosis not present

## 2023-03-18 DIAGNOSIS — F02A4 Dementia in other diseases classified elsewhere, mild, with anxiety: Secondary | ICD-10-CM | POA: Diagnosis not present

## 2023-03-18 DIAGNOSIS — J449 Chronic obstructive pulmonary disease, unspecified: Secondary | ICD-10-CM | POA: Diagnosis not present

## 2023-03-19 DIAGNOSIS — F32A Depression, unspecified: Secondary | ICD-10-CM | POA: Diagnosis not present

## 2023-03-19 DIAGNOSIS — I5020 Unspecified systolic (congestive) heart failure: Secondary | ICD-10-CM | POA: Diagnosis not present

## 2023-03-19 DIAGNOSIS — N1831 Chronic kidney disease, stage 3a: Secondary | ICD-10-CM | POA: Diagnosis not present

## 2023-03-19 DIAGNOSIS — R12 Heartburn: Secondary | ICD-10-CM | POA: Diagnosis not present

## 2023-03-19 DIAGNOSIS — G4700 Insomnia, unspecified: Secondary | ICD-10-CM | POA: Diagnosis not present

## 2023-03-19 DIAGNOSIS — F02A4 Dementia in other diseases classified elsewhere, mild, with anxiety: Secondary | ICD-10-CM | POA: Diagnosis not present

## 2023-03-19 DIAGNOSIS — K219 Gastro-esophageal reflux disease without esophagitis: Secondary | ICD-10-CM | POA: Diagnosis not present

## 2023-03-19 DIAGNOSIS — J449 Chronic obstructive pulmonary disease, unspecified: Secondary | ICD-10-CM | POA: Diagnosis not present

## 2023-03-19 DIAGNOSIS — H900 Conductive hearing loss, bilateral: Secondary | ICD-10-CM | POA: Diagnosis not present

## 2023-03-19 DIAGNOSIS — J42 Unspecified chronic bronchitis: Secondary | ICD-10-CM | POA: Diagnosis not present

## 2023-03-19 DIAGNOSIS — J309 Allergic rhinitis, unspecified: Secondary | ICD-10-CM | POA: Diagnosis not present

## 2023-03-19 DIAGNOSIS — D649 Anemia, unspecified: Secondary | ICD-10-CM | POA: Diagnosis not present

## 2023-03-21 DIAGNOSIS — N1831 Chronic kidney disease, stage 3a: Secondary | ICD-10-CM | POA: Diagnosis not present

## 2023-03-21 DIAGNOSIS — J309 Allergic rhinitis, unspecified: Secondary | ICD-10-CM | POA: Diagnosis not present

## 2023-03-21 DIAGNOSIS — F02A4 Dementia in other diseases classified elsewhere, mild, with anxiety: Secondary | ICD-10-CM | POA: Diagnosis not present

## 2023-03-21 DIAGNOSIS — I5020 Unspecified systolic (congestive) heart failure: Secondary | ICD-10-CM | POA: Diagnosis not present

## 2023-03-21 DIAGNOSIS — J449 Chronic obstructive pulmonary disease, unspecified: Secondary | ICD-10-CM | POA: Diagnosis not present

## 2023-03-21 DIAGNOSIS — H900 Conductive hearing loss, bilateral: Secondary | ICD-10-CM | POA: Diagnosis not present

## 2023-03-22 DIAGNOSIS — H900 Conductive hearing loss, bilateral: Secondary | ICD-10-CM | POA: Diagnosis not present

## 2023-03-22 DIAGNOSIS — J309 Allergic rhinitis, unspecified: Secondary | ICD-10-CM | POA: Diagnosis not present

## 2023-03-22 DIAGNOSIS — N1831 Chronic kidney disease, stage 3a: Secondary | ICD-10-CM | POA: Diagnosis not present

## 2023-03-22 DIAGNOSIS — F02A4 Dementia in other diseases classified elsewhere, mild, with anxiety: Secondary | ICD-10-CM | POA: Diagnosis not present

## 2023-03-22 DIAGNOSIS — I5020 Unspecified systolic (congestive) heart failure: Secondary | ICD-10-CM | POA: Diagnosis not present

## 2023-03-22 DIAGNOSIS — J449 Chronic obstructive pulmonary disease, unspecified: Secondary | ICD-10-CM | POA: Diagnosis not present

## 2023-03-23 ENCOUNTER — Non-Acute Institutional Stay (SKILLED_NURSING_FACILITY): Payer: Self-pay | Admitting: Nurse Practitioner

## 2023-03-23 DIAGNOSIS — I63411 Cerebral infarction due to embolism of right middle cerebral artery: Secondary | ICD-10-CM | POA: Diagnosis not present

## 2023-03-23 DIAGNOSIS — R300 Dysuria: Secondary | ICD-10-CM

## 2023-03-23 DIAGNOSIS — M159 Polyosteoarthritis, unspecified: Secondary | ICD-10-CM | POA: Diagnosis not present

## 2023-03-23 DIAGNOSIS — J4489 Other specified chronic obstructive pulmonary disease: Secondary | ICD-10-CM | POA: Diagnosis not present

## 2023-03-23 DIAGNOSIS — D62 Acute posthemorrhagic anemia: Secondary | ICD-10-CM | POA: Diagnosis not present

## 2023-03-23 DIAGNOSIS — I509 Heart failure, unspecified: Secondary | ICD-10-CM

## 2023-03-23 DIAGNOSIS — M797 Fibromyalgia: Secondary | ICD-10-CM

## 2023-03-23 DIAGNOSIS — K219 Gastro-esophageal reflux disease without esophagitis: Secondary | ICD-10-CM | POA: Diagnosis not present

## 2023-03-23 DIAGNOSIS — N1831 Chronic kidney disease, stage 3a: Secondary | ICD-10-CM

## 2023-03-23 DIAGNOSIS — I10 Essential (primary) hypertension: Secondary | ICD-10-CM | POA: Diagnosis not present

## 2023-03-23 NOTE — Assessment & Plan Note (Signed)
Intermittent elevated Sbp, asymptomatic, off meds.

## 2023-03-23 NOTE — Assessment & Plan Note (Signed)
on  Nitrofurantoin 50mg qd for UTI suppression, Dr. Ottelin.  

## 2023-03-23 NOTE — Assessment & Plan Note (Signed)
 intermittent chronic mild expiratory wheezes, c/o SOB, uses O2,  takes Benzonatate, DuoNeb, prn Albuterol/Budsonide, cough is chronic, on Prednisone low dose. treated with higher dose of Prednisone for symptomatic control.

## 2023-03-23 NOTE — Assessment & Plan Note (Signed)
 Chronic lower back, leg pain,  R shoulder pain, had Ortho eval, takes Lyrica, Tylenol, Norco, Morphine

## 2023-03-23 NOTE — Assessment & Plan Note (Signed)
 GERD/chronic gastritis/atrophic gastritis, GI bleed 02/2020, s/p EGD, per biopsy, takes PPI,  F/u GI prn, taking Omeprazole.

## 2023-03-23 NOTE — Assessment & Plan Note (Signed)
 Bun/creat 19/1.13 01/20/23

## 2023-03-23 NOTE — Assessment & Plan Note (Signed)
 clinically presumed. 08/16/22 Echo mild aortic stenosis, normal left ventricular systolic function, no EF.  BNP in 364 08/12/22>>248 09/21/22, chronic edema BLE, on Furosemide. (CXR showed central pulmonary venous congestion w/o frank pulmonary edema, chronic lung disease, no acute findings of osseous structures)

## 2023-03-23 NOTE — Assessment & Plan Note (Signed)
 Fibromyalgia takes Norco, lyrica 

## 2023-03-23 NOTE — Progress Notes (Signed)
 Location:   SNF [facility] Room Number: 60A Place of Service:  SNF (31) Provider: Larwance Angelle Isais NP  Catori Panozzo X, NP  Patient Care Team: Onyx Schirmer X, NP as PCP - General (Internal Medicine) Marlon Vonruden X, NP as Nurse Practitioner (Internal Medicine)  Extended Emergency Contact Information Primary Emergency Contact: Cruzan,Jeff Home Phone: [phone] Relation: Son Secondary Emergency Contact: Culbreth Sr.,Christopher Home Phone: [phone] Relation: Relative  Code Status:  DNR Goals of care: Advanced Directive information    01/07/2023    1:30 PM  Advanced Directives  Does Patient Have a Medical Advance Directive? Yes  Type of Advance Directive Living will;Out of facility DNR (pink MOST or yellow form)  Does patient want to make changes to medical advance directive? No - Patient declined  Pre-existing out of facility DNR order (yellow form or pink MOST form) Yellow form placed in chart (order not valid for inpatient use)     Chief Complaint  Patient presents with   Medical Management of Chronic Issues    HPI:  Pt is a 89 y.o. female seen today for medical management of chronic diseases.      Gait abnormality, walker for ambulation,  risk falling             CHF clinically presumed. 08/16/22 Echo mild aortic stenosis, normal left ventricular systolic function, no EF.  BNP in 364 08/12/22>>248 09/21/22, chronic edema BLE, on Furosemide . (CXR showed central pulmonary venous congestion w/o frank pulmonary edema, chronic lung disease, no acute findings of osseous structures)             COPD, intermittent chronic mild expiratory wheezes, c/o SOB, uses O2,  takes Benzonatate , DuoNeb, prn Albuterol /Budsonide, cough is chronic, on Prednisone  low dose. treated with higher dose of Prednisone  for symptomatic control.  Hx of CVA, CT x2 03/2021 showed no acute findings except age indeterminate right corona radiator infarct, small vessel changes. Neurology recommended no further work  ups.              Macular degeneration, f/u [facility]: OU for [place] MD involving central area. OS exudative age related MD             Chronic lower back, leg pain,  R shoulder pain, had Ortho eval, takes Lyrica , Tylenol , Norco, Morphine             HTN, off  Metoprolol .              Urinary symptoms, on  Nitrofurantoin 50mg  qd for UTI suppression, Dr. Ottelin             Anemia, Hx of Hgb 6.8 in hospital s/p 2 units of PRBC, on  Vit B12, Iron, Hgb 9.8 01/11/23<<10.5 01/20/23             CKD Bun/creat 19/1.13 01/20/23             GERD/chronic gastritis/atrophic gastritis, GI bleed 02/2020, s/p EGD, per biopsy, takes PPI,  F/u GI prn, taking Omeprazole .  Fibromyalgia takes Norco, lyrica              Tactile hallucinations, off meds. 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     Fibromyalgia    GERD 12/19/2006   Qualifier: Diagnosis of  By: Latisha CMA, Anette  04/13/16 TSH 1.20, Na 141, K 4.2, Bun 15, creat 0.79, wbc 5.5, Hgb 11.5, plt 283    GERD (gastroesophageal reflux disease)    Hammer toe of second toe of left foot 04/08/2016   Left 2nd    Hearing loss    Hemorrhoids    Hypercholesterolemia    Hypertension    Osteoarthritis 12/19/2006   Qualifier: Diagnosis of  By: Latisha CMA, Leigh     Osteoporosis    Past Surgical History:  Procedure Laterality Date   BIOPSY  02/17/2020   Procedure: BIOPSY;  Surgeon: Charlanne Groom, MD;  Location: [facility];  Service: Endoscopy;;   CATARACT EXTRACTION, BILATERAL  2009   COLONOSCOPY  2009, 2006 , 2017   ESOPHAGOGASTRODUODENOSCOPY (EGD) WITH PROPOFOL  N/A 02/17/2020   Procedure: ESOPHAGOGASTRODUODENOSCOPY (EGD) WITH PROPOFOL ;  Surgeon: Charlanne Groom, MD;  Location: [facility];  Service: Endoscopy;  Laterality: N/A;   IR ANGIOGRAM SELECTIVE EACH ADDITIONAL VESSEL  02/17/2020   IR ANGIOGRAM SELECTIVE EACH ADDITIONAL VESSEL  02/17/2020   IR ANGIOGRAM SELECTIVE EACH ADDITIONAL VESSEL  02/17/2020   IR ANGIOGRAM SELECTIVE EACH ADDITIONAL VESSEL  02/17/2020   IR ANGIOGRAM SELECTIVE EACH ADDITIONAL VESSEL  02/17/2020   IR ANGIOGRAM VISCERAL SELECTIVE  02/17/2020   IR ANGIOGRAM VISCERAL SELECTIVE  02/17/2020   IR US  GUIDE VASC ACCESS RIGHT  02/17/2020   stapes surgery     Dr Thaddeus   TONSILLECTOMY AND ADENOIDECTOMY     as a child    Allergies  Allergen Reactions   Penicillins Anaphylaxis   Bisphosphonates Other (See Comments)    [name]   Influenza Vaccines     Unknown   Simvastatin     Unknown    Trazodone  And Nefazodone     Felt her throat was closing up/kept her awake   Sulfonamide Derivatives Rash and Other (See Comments)    blisters    Allergies as of 03/23/2023       Reactions   Penicillins Anaphylaxis   Bisphosphonates Other (See Comments)   [name]   Influenza Vaccines    Unknown   Simvastatin    Unknown    Trazodone  And Nefazodone    Felt her throat was closing up/kept her awake   Sulfonamide Derivatives Rash, Other (See Comments)   blisters        Medication List        Accurate as of March 23, 2023  11:59 PM. If you have any questions, ask your nurse or  doctor.          acetaminophen  325 MG tablet Commonly known as: TYLENOL  Take 650 mg by mouth every 4 (four) hours as needed for fever.  Give 650 mg by mouth at bedtime   albuterol  108 (90 Base) MCG/ACT inhaler Commonly known as: VENTOLIN  HFA Inhale into the lungs every 6 (six) hours as needed for wheezing or shortness of breath.   Albuterol -Budesonide 90-80 MCG/ACT Aero Inhale 2 puffs into the lungs every 6 (six) hours as needed (shortness of breath/ wheezing).   Antacid 750 MG chewable tablet Generic drug: calcium  carbonate Chew 1 tablet by mouth daily.   benzonatate  100 MG capsule Commonly known as: TESSALON  Take 1 capsule (100 mg total) by mouth 2 (two) times daily as needed for cough.   budesonide-formoterol 80-4.5 MCG/ACT inhaler Commonly known as: SYMBICORT Inhale 1 puff into the lungs daily.   cholecalciferol 1000 units tablet Commonly known as: VITAMIN D  Take 1,000 Units by mouth daily.   cyanocobalamin  1000 MCG tablet Commonly known as: VITAMIN B12 Take 1,000 mcg by mouth daily.   docusate sodium  100 MG capsule Commonly known as: COLACE Take 100 mg by mouth 2 (two) times daily.   escitalopram 20 MG tablet Commonly known as: LEXAPRO Take 20 mg by mouth at bedtime.   ferrous sulfate 325 (65 FE) MG tablet Take 325 mg by mouth. Once A Day on Mon, Wed, Fri   furosemide  40 MG tablet Commonly known as: LASIX  Take 1 tablet (40 mg total) by mouth daily for 2 days.   HYDROcodone -acetaminophen  5-325 MG tablet Commonly known as: NORCO/VICODIN Take 1 tablet by mouth every 6 (six) hours as needed for moderate pain.   hydrocortisone  cream 1 % Apply 1 application topically 2 (two) times daily as needed (wrists).   hydrocortisone  valerate cream 0.2 % Commonly known as: WESTCORT  Apply 1 application topically 2 (two) times daily as needed (for itching). Cleanse behind ears with gentle cleanser and dry completely.   ipratropium-albuterol  0.5-2.5 (3) MG/3ML  Soln Commonly known as: DUONEB Take 3 mLs by nebulization daily.   ipratropium-albuterol  0.5-2.5 (3) MG/3ML Soln Commonly known as: DUONEB Take 3 mLs by nebulization in the morning and at bedtime. 1 vial inhale orally every 24 hours as needed for Wheezing   ketoconazole 2 % cream Commonly known as: NIZORAL Apply 1 application  topically 2 (two) times daily as needed for irritation.   nitrofurantoin 50 MG capsule Commonly known as: MACRODANTIN Take 50 mg by mouth at bedtime.   Olopatadine HCl 0.2 % Soln Place 1 drop into both eyes daily.   omeprazole  20 MG capsule Commonly known as: PRILOSEC Take 20 mg by mouth daily.   polyethylene glycol 17 g packet Commonly known as: MIRALAX  / GLYCOLAX  Take 17 g by mouth 2 (two) times a week. Sunday and Wednesday   potassium chloride  20 MEQ packet Commonly known as: KLOR-CON  Take 20 mEq by mouth 2 (two) times daily.   predniSONE  5 MG tablet Commonly known as: DELTASONE  Take 5 mg by mouth daily with breakfast.   pregabalin  75 MG capsule Commonly known as: LYRICA  Take 1 capsule (75 mg total) by mouth at bedtime.   Sarna Sensitive 1 % Lotn Generic drug: pramoxine Apply to cheeks topically one time a day for rosy burning cheeks   Systane Complete 0.6 % Soln Generic drug: Propylene Glycol Instill 1 drop in both eyes four times a day for DRY EYE  Voltaren 1 % Gel Generic drug: diclofenac Sodium Apply 2 g topically every 8 (eight) hours as needed.   zolpidem  5 MG tablet Commonly known as: Ambien  Take 1 tablet (5 mg total) by mouth at bedtime.        Review of Systems  Constitutional:  Negative for appetite change, fatigue and fever.  HENT:  Positive for hearing loss. Negative for congestion and voice change.   Eyes:  Negative for visual disturbance.       C/o blurred vision, ? Color, ? Right eye peripheral vision loss-f/u Ophthalmology  Respiratory:  Positive for cough and shortness of breath. Negative for chest tightness  and wheezing.        DOE, chronic hacking cough  Cardiovascular:  Positive for leg swelling. Negative for chest pain and palpitations.  Gastrointestinal:  Negative for abdominal pain and constipation.  Genitourinary:  Negative for dysuria and urgency.       Chronic, on and off  Musculoskeletal:  Positive for arthralgias, back pain, gait problem and myalgias. Negative for joint swelling.       Anterior right sided rib cage under the breast pain with movement, deep breathing, and palpation.   Skin:  Positive for wound.  Neurological:  Negative for speech difficulty, weakness and light-headedness.  Psychiatric/Behavioral:  Negative for behavioral problems, hallucinations and sleep disturbance.        Chronic going to bed late, sleeping in late.     Immunization History  Administered Date(s) Administered   Moderna SARS-COV2 Booster Vaccination 12/25/2019   Moderna Sars-Covid-2 Vaccination 02/17/2019, 03/17/2019   Pneumococcal Conjugate-13 09/01/2015   Pneumococcal Polysaccharide-23 07/07/1998   Td 08/21/2009   Tdap 09/22/2022   Pertinent  Health Maintenance Due  Topic Date Due   DEXA SCAN  Completed   INFLUENZA VACCINE  Discontinued      03/20/2021   12:00 AM 03/20/2021   12:00 PM 02/05/2022    9:16 AM 03/09/2022   10:01 AM 07/23/2022    3:44 PM  Fall Risk  Falls in the past year?   0 0 0  Was there an injury with Fall?   0 0 0  Fall Risk Category Calculator   0 0 0  Fall Risk Category (Retired)   Low    (RETIRED) Patient Fall Risk Level High fall risk High fall risk High fall risk    Patient at Risk for Falls Due to   History of fall(s) No Fall Risks No Fall Risks  Fall risk Follow up   Falls evaluation completed Falls evaluation completed Falls evaluation completed   Functional Status Survey:    Vitals:   03/23/23 1458 03/24/23 1104  BP: (!) 159/54 (!) 159/54  Pulse: 65   Resp: 18   Temp: (!) 97.1 F (36.2 C)   SpO2: 99%   Weight: 157 lb 12.8 oz (71.6 kg)    Body mass  index is 30.82 kg/m. Physical Exam Vitals and nursing note reviewed.  Constitutional:      Appearance: Normal appearance.  HENT:     Head: Normocephalic and atraumatic.     Nose: Nose normal.     Mouth/Throat:     Mouth: Mucous membranes are moist.  Eyes:     Extraocular Movements: Extraocular movements intact.     Conjunctiva/sclera: Conjunctivae normal.     Pupils: Pupils are equal, round, and reactive to light.     Comments: Able to count my fingers 3 feet away from her eyes.   Cardiovascular:  Rate and Rhythm: Normal rate and regular rhythm.     Heart sounds: No murmur heard. Pulmonary:     Effort: Pulmonary effort is normal.     Breath sounds: Rales present. No wheezing.     Comments: Decreased air entry to both lungs. Needs O2 via [place] to maintain SatO2>90, rales bibasilar.  Abdominal:     General: Bowel sounds are normal.     Palpations: Abdomen is soft.     Tenderness: There is no abdominal tenderness.  Musculoskeletal:        General: Tenderness present.     Cervical back: Normal range of motion and neck supple.     Right lower leg: Edema present.     Left lower leg: Edema present.     Comments: mild edema BLE. R shoulder pain with overhead ROM.   Anterior right sided rib cage under the breast pain with movement, deep breathing, and palpation.    Skin:    General: Skin is warm and dry.     Findings: Bruising and erythema present.     Comments: The lower left leg redness, warmth, swelling. Stasis ulcer with yellow slough in the previous skin tear area Improved.  Left arm, buttocks bruises.    Neurological:     General: No focal deficit present.     Mental Status: She is alert. Mental status is at baseline.     Motor: No weakness.     Coordination: Coordination normal.     Gait: Gait abnormal.     Comments: Oriented to person, place.   Psychiatric:        Mood and Affect: Mood normal.        Behavior: Behavior normal.     Labs reviewed: Recent Labs     07/31/22 1448 08/13/22 0000 08/24/22 0000 11/09/22 0000  NA 137 142 141 139  K 3.6 3.1* 4.1 3.7  CL 100 101 103 98*  CO2 27 29* 31* 32*  GLUCOSE 112*  --   --   --   BUN 16 22* 18 20  CREATININE 1.25* 1.2* 1.1 1.1  CALCIUM  9.1 8.8 8.8 9.0   Recent Labs    07/31/22 1448  AST 20  ALT 19  ALKPHOS 78  BILITOT 1.2  PROT 6.1*  ALBUMIN 3.6   Recent Labs    07/27/22 0000 07/31/22 1448  WBC 8.1 7.0  HGB 12.3 13.3  HCT 37 41.6  MCV  --  95.2  PLT 194 179   Lab Results  Component Value Date   TSH 1.03 08/13/2022   Lab Results  Component Value Date   HGBA1C 6.6 08/13/2022   Lab Results  Component Value Date   CHOL 192 03/19/2021   HDL 69 03/19/2021   LDLCALC 107 (H) 03/19/2021   LDLDIRECT 114.7 08/18/2011   TRIG 78 03/19/2021   CHOLHDL 2.8 03/19/2021    Significant Diagnostic Results in last 30 days:  No results found.  Assessment/Plan  Congestive heart failure (CHF) (HCC) clinically presumed. 08/16/22 Echo mild aortic stenosis, normal left ventricular systolic function, no EF.  BNP in 364 08/12/22>>248 09/21/22, chronic edema BLE, on Furosemide . (CXR showed central pulmonary venous congestion w/o frank pulmonary edema, chronic lung disease, no acute findings of osseous structures)  COPD (chronic obstructive pulmonary disease) with chronic bronchitis intermittent chronic mild expiratory wheezes, c/o SOB, uses O2,  takes Benzonatate , DuoNeb, prn Albuterol /Budsonide, cough is chronic, on Prednisone  low dose. treated with higher dose of Prednisone  for symptomatic control.  CVA (cerebral vascular accident) [facility]) Hx of CVA, CT x2 03/2021 showed no acute findings except age indeterminate right corona radiator infarct, small vessel changes. Neurology recommended no further work ups.  Osteoarthritis  Chronic lower back, leg pain,  R shoulder pain, had Ortho eval, takes Lyrica , Tylenol , Norco, Morphine  HTN (hypertension) Intermittent elevated Sbp, asymptomatic, off meds.    Dysuria on  Nitrofurantoin 50mg  qd for UTI suppression, Dr. Ottelin  ABLA (acute blood loss anemia) Hx of Hgb 6.8 in hospital s/p 2 units of PRBC, on  Vit B12, Iron, Hgb 9.8 01/11/23<<10.5 01/20/23  CKD (chronic kidney disease) stage 3, GFR 30-59 ml/min (HCC) Bun/creat 19/1.13 01/20/23  GERD  GERD/chronic gastritis/atrophic gastritis, GI bleed 02/2020, s/p EGD, per biopsy, takes PPI,  F/u GI prn, taking Omeprazole .   Fibromyalgia Fibromyalgia takes Norco, lyrica    Family/ staff Communication: plan of care reviewed with the patient and charge nurse.   Labs/tests ordered: none

## 2023-03-23 NOTE — Assessment & Plan Note (Signed)
Hx of CVA, CT x2 03/2021 showed no acute findings except age indeterminate right corona radiator infarct, small vessel changes. Neurology recommended no further work ups 

## 2023-03-23 NOTE — Assessment & Plan Note (Signed)
 Hx of Hgb 6.8 in hospital s/p 2 units of PRBC, on  Vit B12, Iron, Hgb 9.8 01/11/23<<10.5 01/20/23

## 2023-03-24 ENCOUNTER — Encounter: Payer: Self-pay | Admitting: Nurse Practitioner

## 2023-03-25 DIAGNOSIS — N1831 Chronic kidney disease, stage 3a: Secondary | ICD-10-CM | POA: Diagnosis not present

## 2023-03-25 DIAGNOSIS — F02A4 Dementia in other diseases classified elsewhere, mild, with anxiety: Secondary | ICD-10-CM | POA: Diagnosis not present

## 2023-03-25 DIAGNOSIS — I5020 Unspecified systolic (congestive) heart failure: Secondary | ICD-10-CM | POA: Diagnosis not present

## 2023-03-25 DIAGNOSIS — J309 Allergic rhinitis, unspecified: Secondary | ICD-10-CM | POA: Diagnosis not present

## 2023-03-25 DIAGNOSIS — H900 Conductive hearing loss, bilateral: Secondary | ICD-10-CM | POA: Diagnosis not present

## 2023-03-25 DIAGNOSIS — J449 Chronic obstructive pulmonary disease, unspecified: Secondary | ICD-10-CM | POA: Diagnosis not present

## 2023-03-29 DIAGNOSIS — J309 Allergic rhinitis, unspecified: Secondary | ICD-10-CM | POA: Diagnosis not present

## 2023-03-29 DIAGNOSIS — N1831 Chronic kidney disease, stage 3a: Secondary | ICD-10-CM | POA: Diagnosis not present

## 2023-03-29 DIAGNOSIS — I5020 Unspecified systolic (congestive) heart failure: Secondary | ICD-10-CM | POA: Diagnosis not present

## 2023-03-29 DIAGNOSIS — F02A4 Dementia in other diseases classified elsewhere, mild, with anxiety: Secondary | ICD-10-CM | POA: Diagnosis not present

## 2023-03-29 DIAGNOSIS — J449 Chronic obstructive pulmonary disease, unspecified: Secondary | ICD-10-CM | POA: Diagnosis not present

## 2023-03-29 DIAGNOSIS — H900 Conductive hearing loss, bilateral: Secondary | ICD-10-CM | POA: Diagnosis not present

## 2023-03-30 DIAGNOSIS — N1831 Chronic kidney disease, stage 3a: Secondary | ICD-10-CM | POA: Diagnosis not present

## 2023-03-30 DIAGNOSIS — F02A4 Dementia in other diseases classified elsewhere, mild, with anxiety: Secondary | ICD-10-CM | POA: Diagnosis not present

## 2023-03-30 DIAGNOSIS — J309 Allergic rhinitis, unspecified: Secondary | ICD-10-CM | POA: Diagnosis not present

## 2023-03-30 DIAGNOSIS — H900 Conductive hearing loss, bilateral: Secondary | ICD-10-CM | POA: Diagnosis not present

## 2023-03-30 DIAGNOSIS — J449 Chronic obstructive pulmonary disease, unspecified: Secondary | ICD-10-CM | POA: Diagnosis not present

## 2023-03-30 DIAGNOSIS — I5020 Unspecified systolic (congestive) heart failure: Secondary | ICD-10-CM | POA: Diagnosis not present

## 2023-04-01 DIAGNOSIS — J309 Allergic rhinitis, unspecified: Secondary | ICD-10-CM | POA: Diagnosis not present

## 2023-04-01 DIAGNOSIS — N1831 Chronic kidney disease, stage 3a: Secondary | ICD-10-CM | POA: Diagnosis not present

## 2023-04-01 DIAGNOSIS — H900 Conductive hearing loss, bilateral: Secondary | ICD-10-CM | POA: Diagnosis not present

## 2023-04-01 DIAGNOSIS — J449 Chronic obstructive pulmonary disease, unspecified: Secondary | ICD-10-CM | POA: Diagnosis not present

## 2023-04-01 DIAGNOSIS — I5020 Unspecified systolic (congestive) heart failure: Secondary | ICD-10-CM | POA: Diagnosis not present

## 2023-04-01 DIAGNOSIS — F02A4 Dementia in other diseases classified elsewhere, mild, with anxiety: Secondary | ICD-10-CM | POA: Diagnosis not present

## 2023-04-05 DIAGNOSIS — H900 Conductive hearing loss, bilateral: Secondary | ICD-10-CM | POA: Diagnosis not present

## 2023-04-05 DIAGNOSIS — F02A4 Dementia in other diseases classified elsewhere, mild, with anxiety: Secondary | ICD-10-CM | POA: Diagnosis not present

## 2023-04-05 DIAGNOSIS — J449 Chronic obstructive pulmonary disease, unspecified: Secondary | ICD-10-CM | POA: Diagnosis not present

## 2023-04-05 DIAGNOSIS — J309 Allergic rhinitis, unspecified: Secondary | ICD-10-CM | POA: Diagnosis not present

## 2023-04-05 DIAGNOSIS — I5020 Unspecified systolic (congestive) heart failure: Secondary | ICD-10-CM | POA: Diagnosis not present

## 2023-04-05 DIAGNOSIS — N1831 Chronic kidney disease, stage 3a: Secondary | ICD-10-CM | POA: Diagnosis not present

## 2023-04-06 DIAGNOSIS — I5020 Unspecified systolic (congestive) heart failure: Secondary | ICD-10-CM | POA: Diagnosis not present

## 2023-04-06 DIAGNOSIS — H900 Conductive hearing loss, bilateral: Secondary | ICD-10-CM | POA: Diagnosis not present

## 2023-04-06 DIAGNOSIS — N1831 Chronic kidney disease, stage 3a: Secondary | ICD-10-CM | POA: Diagnosis not present

## 2023-04-06 DIAGNOSIS — J309 Allergic rhinitis, unspecified: Secondary | ICD-10-CM | POA: Diagnosis not present

## 2023-04-06 DIAGNOSIS — F02A4 Dementia in other diseases classified elsewhere, mild, with anxiety: Secondary | ICD-10-CM | POA: Diagnosis not present

## 2023-04-06 DIAGNOSIS — J449 Chronic obstructive pulmonary disease, unspecified: Secondary | ICD-10-CM | POA: Diagnosis not present

## 2023-04-08 ENCOUNTER — Non-Acute Institutional Stay (SKILLED_NURSING_FACILITY): Payer: Self-pay | Admitting: Sports Medicine

## 2023-04-08 DIAGNOSIS — J4489 Other specified chronic obstructive pulmonary disease: Secondary | ICD-10-CM | POA: Diagnosis not present

## 2023-04-08 DIAGNOSIS — J449 Chronic obstructive pulmonary disease, unspecified: Secondary | ICD-10-CM | POA: Diagnosis not present

## 2023-04-08 DIAGNOSIS — I5020 Unspecified systolic (congestive) heart failure: Secondary | ICD-10-CM | POA: Diagnosis not present

## 2023-04-08 DIAGNOSIS — L03116 Cellulitis of left lower limb: Secondary | ICD-10-CM

## 2023-04-08 DIAGNOSIS — N1831 Chronic kidney disease, stage 3a: Secondary | ICD-10-CM | POA: Diagnosis not present

## 2023-04-08 DIAGNOSIS — I509 Heart failure, unspecified: Secondary | ICD-10-CM

## 2023-04-08 DIAGNOSIS — F02A4 Dementia in other diseases classified elsewhere, mild, with anxiety: Secondary | ICD-10-CM | POA: Diagnosis not present

## 2023-04-08 DIAGNOSIS — J309 Allergic rhinitis, unspecified: Secondary | ICD-10-CM | POA: Diagnosis not present

## 2023-04-08 DIAGNOSIS — H900 Conductive hearing loss, bilateral: Secondary | ICD-10-CM | POA: Diagnosis not present

## 2023-04-08 NOTE — Progress Notes (Signed)
 Provider:  Dr. Venita Sheffield Location:  [facility] Place of Service:   Skilled care   PCP: Mast, Man X, NP Patient Care Team: Mast, Man X, NP as PCP - General (Internal Medicine) Mast, Man X, NP as Nurse Practitioner (Internal Medicine)  Extended Emergency Contact Information Primary Emergency Contact: Holstein,Jeff Home Phone: [phone] Relation: Son Secondary Emergency Contact: Culbreth Sr.,Christopher Home Phone: [phone] Relation: Relative  Goals of Care: Advanced Directive information    01/07/2023    1:30 PM  Advanced Directives  Does Patient Have a Medical Advance Directive? Yes  Type of Advance Directive Living will;Out of facility DNR (pink MOST or yellow form)  Does patient want to make changes to medical advance directive? No - Patient declined  Pre-existing out of facility DNR order (yellow form or pink MOST form) Yellow form placed in chart (order not valid for inpatient use)                                    Non hospice related visit       History of Present Illness        89 yr old F with h/o CHF, COPD, CVA  is evaluated for an acute visit for wound on her Left lower leg  Pt seen and examined in the living room  As per staff pt has open wound in her RT leg and has been weeping some yellowish fluid  since few days Pt is afebrile Pt c/o pain in her left leg  She is wheel chair dependent and on supplemental o2   H/o COPD  Pt is able to speak in full sentences Does not appear to be in distress Currently on albuterol - budesonide   CHF  Pt does not appear to be in distress On supplemental o2 On lasix   Past Medical History:  Diagnosis Date   Acute blood loss anemia 08/23/2013   04/13/16 TSH 1.20, Na 141, K 4.2, Bun 15, creat 0.79, wbc 5.5, Hgb 11.5, plt 283   Anxiety    Anxiety state 07/02/2007   Qualifier: Diagnosis of  By: Kriste Basque MD, Lonzo Cloud    Asthma    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     Fibromyalgia    GERD 12/19/2006   Qualifier: Diagnosis of  By: Renaldo Fiddler CMA, Marliss Czar  04/13/16 TSH 1.20, Na 141, K 4.2, Bun 15, creat 0.79, wbc 5.5, Hgb 11.5, plt 283    GERD (gastroesophageal reflux disease)    Hammer toe of second toe of left foot 04/08/2016   Left 2nd   Hearing loss    Hemorrhoids    Hypercholesterolemia    Hypertension    Osteoarthritis 12/19/2006   Qualifier: Diagnosis of  By: Renaldo Fiddler CMA, Leigh     Osteoporosis    Past Surgical History:  Procedure Laterality Date   BIOPSY  02/17/2020   Procedure: BIOPSY;  Surgeon: Lynann Bologna, MD;  Location: [facility];  Service: Endoscopy;;   CATARACT EXTRACTION,  BILATERAL  2009   COLONOSCOPY  2009, 2006 , 2017   ESOPHAGOGASTRODUODENOSCOPY (EGD) WITH PROPOFOL N/A 02/17/2020   Procedure: ESOPHAGOGASTRODUODENOSCOPY (EGD) WITH PROPOFOL;  Surgeon: Lynann Bologna, MD;  Location: [facility];  Service: Endoscopy;  Laterality: N/A;   IR ANGIOGRAM SELECTIVE EACH ADDITIONAL VESSEL  02/17/2020   IR ANGIOGRAM SELECTIVE EACH ADDITIONAL VESSEL  02/17/2020   IR ANGIOGRAM SELECTIVE EACH ADDITIONAL VESSEL  02/17/2020   IR ANGIOGRAM SELECTIVE EACH ADDITIONAL VESSEL  02/17/2020   IR ANGIOGRAM SELECTIVE EACH ADDITIONAL VESSEL  02/17/2020   IR ANGIOGRAM VISCERAL SELECTIVE  02/17/2020   IR ANGIOGRAM VISCERAL SELECTIVE  02/17/2020   IR US GUIDE VASC ACCESS RIGHT  02/17/2020   stapes surgery     Dr Dorma Russell   TONSILLECTOMY AND ADENOIDECTOMY     as a child    reports that she quit smoking about 13 years ago. Her smoking use included cigarettes. She has never used smokeless tobacco. She reports current alcohol  use. She reports that she does not use drugs. Social History   Socioeconomic History   Marital status: Widowed    Spouse name: Not on file   Number of children: Not on file   Years of education: Not on file   Highest education level: Not on file  Occupational History   Occupation: retired   Occupation: works some weekends at [employer]  Tobacco Use   Smoking status: Former    Current packs/day: 0.00    Types: Cigarettes    Quit date: 02/15/2010    Years since quitting: 13.1   Smokeless tobacco: Never  Vaping Use   Vaping status: Never Used  Substance and Sexual Activity   Alcohol use: Yes    Comment: occasional glass of wine   Drug use: No   Sexual activity: Never  Other Topics Concern   Not on file  Social History Narrative   Pt works some weekends at [employer]   Caffeine use - daily coffee in the mornings   Patient gets regular exercise > tries to walk daily for exercise   Moved [place] [facility] [place] 03/11/16   Widowed   Former Smoker-stopped 2012   Alcohol occasionally glass of wine   Living Will   Social Drivers of Corporate investment banker Strain: Low Risk  (11/08/2017)   Overall Financial Resource Strain (CARDIA)    Difficulty of Paying Living Expenses: Not hard at all  Food Insecurity: No Food Insecurity (11/08/2017)   Hunger Vital Sign    Worried About Running Out of Food in the Last Year: Never true    Ran Out of Food in the Last Year: Never true  Transportation Needs: No Transportation Needs (11/08/2017)   PRAPARE - Administrator, Civil Service (Medical): No    Lack of Transportation (Non-Medical): No  Physical Activity: Sufficiently Active (11/08/2017)   Exercise Vital Sign    Days of Exercise per Week: 5 days    Minutes of Exercise per Session: 30 min  Stress: No Stress Concern Present (11/08/2017)   [employer] of Occupational Health - Occupational Stress Questionnaire    Feeling of Stress : Not at all  Social  Connections: Moderately Isolated (11/08/2017)   Social Connection and Isolation Panel [NHANES]    Frequency of Communication with Friends and Family: More than three times a week    Frequency of Social Gatherings with Friends and Family: Twice a week    Attends Religious Services: Never    Active  Member of Clubs or Organizations: No    Attends Banker Meetings: Never    Marital Status: Widowed  Intimate Partner Violence: Not At Risk (11/08/2017)   Humiliation, Afraid, Rape, and Kick questionnaire    Fear of Current or Ex-Partner: No    Emotionally Abused: No    Physically Abused: No    Sexually Abused: No    Functional Status Survey:    Family History  Problem Relation Age of Onset   Heart disease Father    COPD Father    Hypertension Mother    Arthritis Mother     Health Maintenance  Topic Date Due   Pneumonia Vaccine 76+ Years old (3 of 3 - PPSV23 or PCV20) 10/27/2015   DTaP/Tdap/Td (3 - Td or Tdap) 09/21/2032   DEXA SCAN  Completed   HPV VACCINES  Aged Out   INFLUENZA VACCINE  Discontinued   COVID-19 Vaccine  Discontinued   Zoster Vaccines- Shingrix  Discontinued    Allergies  Allergen Reactions   Penicillins Anaphylaxis   Bisphosphonates Other (See Comments)    [name]   Influenza Vaccines     Unknown   Simvastatin     Unknown    Trazodone And Nefazodone     Felt her throat was closing up/kept her awake   Sulfonamide Derivatives Rash and Other (See Comments)    blisters    Outpatient Encounter Medications as of 04/08/2023  Medication Sig   acetaminophen (TYLENOL) 325 MG tablet Take 650 mg by mouth every 4 (four) hours as needed for fever.  Give 650 mg by mouth at bedtime   albuterol (VENTOLIN HFA) 108 (90 Base) MCG/ACT inhaler Inhale into the lungs every 6 (six) hours as needed for wheezing or shortness of breath.   Albuterol-Budesonide 90-80 MCG/ACT AERO Inhale 2 puffs into the lungs every 6 (six) hours as needed (shortness of breath/  wheezing).   benzonatate (TESSALON) 100 MG capsule Take 1 capsule (100 mg total) by mouth 2 (two) times daily as needed for cough.   budesonide-formoterol (SYMBICORT) 80-4.5 MCG/ACT inhaler Inhale 1 puff into the lungs daily.   calcium carbonate (ANTACID) 750 MG chewable tablet Chew 1 tablet by mouth daily.   cholecalciferol (VITAMIN D) 1000 UNITS tablet Take 1,000 Units by mouth daily.   diclofenac Sodium (VOLTAREN) 1 % GEL Apply 2 g topically every 8 (eight) hours as needed.   docusate sodium (COLACE) 100 MG capsule Take 100 mg by mouth 2 (two) times daily.   escitalopram (LEXAPRO) 20 MG tablet Take 20 mg by mouth at bedtime.   ferrous sulfate 325 (65 FE) MG tablet Take 325 mg by mouth. Once A Day on Mon, Wed, Fri   furosemide (LASIX) 40 MG tablet Take 1 tablet (40 mg total) by mouth daily for 2 days.   HYDROcodone-acetaminophen (NORCO/VICODIN) 5-325 MG tablet Take 1 tablet by mouth every 6 (six) hours as needed for moderate pain.   hydrocortisone cream 1 % Apply 1 application topically 2 (two) times daily as needed (wrists).   hydrocortisone valerate cream (WESTCORT) 0.2 % Apply 1 application topically 2 (two) times daily as needed (for itching). Cleanse behind ears with gentle cleanser and dry completely.   ipratropium-albuterol (DUONEB) 0.5-2.5 (3) MG/3ML SOLN Take 3 mLs by nebulization daily.   ipratropium-albuterol (DUONEB) 0.5-2.5 (3) MG/3ML SOLN Take 3 mLs by nebulization in the morning and at bedtime. 1 vial inhale orally every 24 hours as needed for Wheezing   ketoconazole (NIZORAL) 2 % cream  Apply 1 application  topically 2 (two) times daily as needed for irritation.   nitrofurantoin (MACRODANTIN) 50 MG capsule Take 50 mg by mouth at bedtime.   Olopatadine HCl 0.2 % SOLN Place 1 drop into both eyes daily.   omeprazole (PRILOSEC) 20 MG capsule Take 20 mg by mouth daily.   polyethylene glycol (MIRALAX / GLYCOLAX) 17 g packet Take 17 g by mouth 2 (two) times a week. Sunday and Wednesday    potassium chloride (KLOR-CON) 20 MEQ packet Take 20 mEq by mouth 2 (two) times daily.   pramoxine (SARNA SENSITIVE) 1 % LOTN Apply to cheeks topically one time a day for rosy burning cheeks   predniSONE (DELTASONE) 5 MG tablet Take 5 mg by mouth daily with breakfast.   pregabalin (LYRICA) 75 MG capsule Take 1 capsule (75 mg total) by mouth at bedtime.   Propylene Glycol (SYSTANE COMPLETE) 0.6 % SOLN Instill 1 drop in both eyes four times a day for DRY EYE   vitamin B-12 (CYANOCOBALAMIN) 1000 MCG tablet Take 1,000 mcg by mouth daily.   zolpidem (AMBIEN) 5 MG tablet Take 1 tablet (5 mg total) by mouth at bedtime.   No facility-administered encounter medications on file as of 04/08/2023.    Review of Systems  Constitutional:  Negative for fever.  Respiratory:  Positive for shortness of breath (exertional). Negative for cough and wheezing.   Cardiovascular:  Positive for leg swelling. Negative for chest pain and palpitations.  Gastrointestinal:  Negative for abdominal distention, abdominal pain, blood in stool, constipation, diarrhea, nausea and vomiting.  Genitourinary:  Negative for dysuria.  Neurological:  Negative for dizziness.   Negative unless indicated in HPI.  There were no vitals filed for this visit. There is no height or weight on file to calculate BMI. BP Readings from Last 3 Encounters:  03/24/23 (!) 159/54  03/07/23 (!) 140/70  02/24/23 (!) 150/68   Wt Readings from Last 3 Encounters:  03/23/23 157 lb 12.8 oz (71.6 kg)  03/07/23 159 lb 8 oz (72.3 kg)  02/22/23 159 lb 8 oz (72.3 kg)   Physical Exam Constitutional:      Appearance: Normal appearance.  Cardiovascular:     Rate and Rhythm: Normal rate and regular rhythm.  Pulmonary:     Effort: Pulmonary effort is normal. No respiratory distress.     Breath sounds: Normal breath sounds. No wheezing.  Abdominal:     General: Bowel sounds are normal. There is no distension.     Tenderness: There is no abdominal  tenderness. There is no guarding or rebound.     Comments:    Musculoskeletal:        General: Swelling present.     Comments: Left leg  Erythematous warmth +  2 cm open shallow ulcer on Lower 1/3 of her left leg with yellowish slough  Neurological:     Mental Status: She is alert. Mental status is at baseline.     Sensory: No sensory deficit.     Motor: No weakness.     Labs reviewed: Basic Metabolic Panel: Recent Labs    07/31/22 1448 08/13/22 0000 08/24/22 0000 11/09/22 0000  NA 137 142 141 139  K 3.6 3.1* 4.1 3.7  CL 100 101 103 98*  CO2 27 29* 31* 32*  GLUCOSE 112*  --   --   --   BUN 16 22* 18 20  CREATININE 1.25* 1.2* 1.1 1.1  CALCIUM 9.1 8.8 8.8 9.0   Liver Function Tests: Recent  Labs    07/31/22 1448  AST 20  ALT 19  ALKPHOS 78  BILITOT 1.2  PROT 6.1*  ALBUMIN 3.6   No results for input(s): "LIPASE", "AMYLASE" in the last 8760 hours. No results for input(s): "AMMONIA" in the last 8760 hours. CBC: Recent Labs    07/27/22 0000 07/31/22 1448  WBC 8.1 7.0  HGB 12.3 13.3  HCT 37 41.6  MCV  --  95.2  PLT 194 179   Cardiac Enzymes: No results for input(s): "CKTOTAL", "CKMB", "CKMBINDEX", "TROPONINI" in the last 8760 hours. BNP: Invalid input(s): "POCBNP" Lab Results  Component Value Date   HGBA1C 6.6 08/13/2022   Lab Results  Component Value Date   TSH 1.03 08/13/2022   Lab Results  Component Value Date   VITAMINB12 1,441 (H) 03/19/2021   No results found for: "FOLATE" Lab Results  Component Value Date   IRON 59 01/10/2019   TIBC 281 01/10/2019   FERRITIN 46 01/10/2019    Imaging and Procedures obtained prior to SNF admission: DG Chest Port 1 View Result Date: 07/31/2022 CLINICAL DATA:  Shortness of breath. EXAM: PORTABLE CHEST 1 VIEW COMPARISON:  02/13/2016 FINDINGS: The heart size and mediastinal contours are within normal limits. Aortic atherosclerotic calcification incidentally noted. Both lungs are clear. The visualized skeletal  structures are unremarkable. IMPRESSION: No active disease. Electronically Signed   By: Danae Orleans M.D.   On: 07/31/2022 15:42    Assessment and Plan   1. Cellulitis of left lower extremity (Primary) Will start doxycycline Wound dressing daily  Use medihoney   2. Congestive heart failure, unspecified HF chronicity, unspecified heart failure type (HCC) Lungs clear  Cont with lasix Avoid salty foods   3. COPD (chronic obstructive pulmonary disease) with chronic bronchitis (HCC) No wheezing  Cont with albuterol - budesonide inhaler         30 min Total time spent for obtaining history,  performing a medically appropriate examination and evaluation, reviewing the tests,ordering  tests,  documenting clinical information in the electronic or other health record,   ,care coordination (not separately reported)

## 2023-04-11 ENCOUNTER — Encounter: Payer: Self-pay | Admitting: Sports Medicine

## 2023-04-12 DIAGNOSIS — N1831 Chronic kidney disease, stage 3a: Secondary | ICD-10-CM | POA: Diagnosis not present

## 2023-04-12 DIAGNOSIS — I5020 Unspecified systolic (congestive) heart failure: Secondary | ICD-10-CM | POA: Diagnosis not present

## 2023-04-12 DIAGNOSIS — F02A4 Dementia in other diseases classified elsewhere, mild, with anxiety: Secondary | ICD-10-CM | POA: Diagnosis not present

## 2023-04-12 DIAGNOSIS — J309 Allergic rhinitis, unspecified: Secondary | ICD-10-CM | POA: Diagnosis not present

## 2023-04-12 DIAGNOSIS — J449 Chronic obstructive pulmonary disease, unspecified: Secondary | ICD-10-CM | POA: Diagnosis not present

## 2023-04-12 DIAGNOSIS — H900 Conductive hearing loss, bilateral: Secondary | ICD-10-CM | POA: Diagnosis not present

## 2023-04-14 ENCOUNTER — Encounter: Payer: Self-pay | Admitting: Nurse Practitioner

## 2023-04-14 ENCOUNTER — Non-Acute Institutional Stay: Payer: Self-pay | Admitting: Nurse Practitioner

## 2023-04-14 DIAGNOSIS — Z0000 Encounter for general adult medical examination without abnormal findings: Secondary | ICD-10-CM

## 2023-04-14 DIAGNOSIS — Z66 Do not resuscitate: Secondary | ICD-10-CM | POA: Diagnosis not present

## 2023-04-14 NOTE — Progress Notes (Signed)
 Subjective:   Lisa King is a 89 y.o. female who presents for Medicare Annual (Subsequent) preventive examination.  Visit Complete: In person  Patient Medicare AWV questionnaire was completed by the patient on 04/14/23; I have confirmed that all information answered by patient is correct and no changes since this date.  Cardiac Risk Factors include: advanced age (>42men, >32 women);dyslipidemia;hypertension;obesity (BMI >30kg/m2);sedentary lifestyle     Objective:    Today's Vitals   04/14/23 1310  BP: 132/60  Pulse: 72  Temp: (!) 97.2 F (36.2 C)  SpO2: 98%  Weight: 158 lb (71.7 kg)  Height: 5' (1.524 m)  PainSc: 0-No pain   Body mass index is 30.86 kg/m.     04/14/2023    3:23 PM 01/07/2023    1:30 PM 12/29/2022    3:56 PM 11/25/2022   10:33 AM 09/24/2022    8:46 AM 09/20/2022    3:06 PM 09/17/2022   11:00 AM  Advanced Directives  Does Patient Have a Medical Advance Directive? Yes Yes Yes Yes Yes Yes Yes  Type of Advance Directive Living will;Out of facility DNR (pink MOST or yellow form) Living will;Out of facility DNR (pink MOST or yellow form) Living will;Out of facility DNR (pink MOST or yellow form) Living will;Out of facility DNR (pink MOST or yellow form) Living will;Out of facility DNR (pink MOST or yellow form) Living will;Out of facility DNR (pink MOST or yellow form) Out of facility DNR (pink MOST or yellow form);Living will  Does patient want to make changes to medical advance directive? No - Patient declined No - Patient declined No - Patient declined No - Patient declined No - Patient declined No - Patient declined No - Patient declined  Pre-existing out of facility DNR order (yellow form or pink MOST form) Yellow form placed in chart (order not valid for inpatient use) Yellow form placed in chart (order not valid for inpatient use) Yellow form placed in chart (order not valid for inpatient use) Yellow form placed in chart (order not valid for inpatient use)        Current Medications (verified) Outpatient Encounter Medications as of 04/14/2023  Medication Sig   acetaminophen (TYLENOL) 325 MG tablet Take 650 mg by mouth every 4 (four) hours as needed for fever.  Give 650 mg by mouth at bedtime   albuterol (VENTOLIN HFA) 108 (90 Base) MCG/ACT inhaler Inhale into the lungs every 6 (six) hours as needed for wheezing or shortness of breath.   Amino Acids-Protein Hydrolys (FEEDING SUPPLEMENT, PRO-STAT 64,) LIQD Take 30 mLs by mouth daily.   benzonatate (TESSALON) 100 MG capsule Take 1 capsule (100 mg total) by mouth 2 (two) times daily as needed for cough.   budesonide-formoterol (SYMBICORT) 160-4.5 MCG/ACT inhaler Inhale 2 puffs into the lungs daily.   calcium carbonate (ANTACID) 750 MG chewable tablet Chew 1 tablet by mouth daily.   cholecalciferol (VITAMIN D) 1000 UNITS tablet Take 1,000 Units by mouth daily.   diclofenac Sodium (VOLTAREN) 1 % GEL Apply 2 g topically every 8 (eight) hours as needed.   docusate sodium (COLACE) 100 MG capsule Take 100 mg by mouth 2 (two) times daily.   doxycycline (VIBRA-TABS) 100 MG tablet Take 100 mg by mouth every 12 (twelve) hours.   escitalopram (LEXAPRO) 20 MG tablet Take 20 mg by mouth at bedtime.   ferrous sulfate 325 (65 FE) MG tablet Take 325 mg by mouth. Once A Day on Mon, Wed, Fri   furosemide (LASIX) 40 MG  tablet Take 40 mg by mouth 2 (two) times daily.   HYDROcodone-acetaminophen (NORCO/VICODIN) 5-325 MG tablet Take 1 tablet by mouth every 6 (six) hours as needed for moderate pain (pain score 4-6).   hydrocortisone cream 1 % Apply 1 application topically 2 (two) times daily as needed (wrists).   hydrocortisone valerate cream (WESTCORT) 0.2 % Apply 1 application topically 2 (two) times daily as needed (for itching). Cleanse behind ears with gentle cleanser and dry completely.   ipratropium-albuterol (DUONEB) 0.5-2.5 (3) MG/3ML SOLN Take 3 mLs by nebulization in the morning and at bedtime. And 1 vial inhale  orally every 24 hours as needed for Wheezing   ketoconazole (NIZORAL) 2 % cream Apply 1 application  topically 2 (two) times daily as needed for irritation.   Morphine Sulfate (MORPHINE CONCENTRATE) 10 mg / 0.5 ml concentrated solution    nitrofurantoin (MACRODANTIN) 50 MG capsule Take 50 mg by mouth at bedtime.   Olopatadine HCl 0.2 % SOLN Place 1 drop into both eyes daily.   omeprazole (PRILOSEC) 20 MG capsule Take 20 mg by mouth daily.   ondansetron (ZOFRAN) 4 MG tablet Take 4 mg by mouth every 8 (eight) hours as needed for nausea or vomiting.   polyethylene glycol (MIRALAX / GLYCOLAX) 17 g packet Take 17 g by mouth 2 (two) times a week. Sunday and Wednesday   potassium chloride SA (KLOR-CON M) 20 MEQ tablet Take 20 mEq by mouth 2 (two) times daily.   pramoxine (SARNA SENSITIVE) 1 % LOTN Apply to cheeks topically one time a day for rosy burning cheeks   predniSONE (DELTASONE) 5 MG tablet Take 5 mg by mouth daily with breakfast.   pregabalin (LYRICA) 75 MG capsule Take 1 capsule (75 mg total) by mouth at bedtime.   Propylene Glycol (SYSTANE COMPLETE) 0.6 % SOLN Instill 1 drop in both eyes four times a day for DRY EYE   vitamin B-12 (CYANOCOBALAMIN) 1000 MCG tablet Take 1,000 mcg by mouth daily.   zolpidem (AMBIEN) 5 MG tablet Take 1 tablet (5 mg total) by mouth at bedtime.   Albuterol-Budesonide 90-80 MCG/ACT AERO Inhale 2 puffs into the lungs every 6 (six) hours as needed (shortness of breath/ wheezing). (Patient not taking: Reported on 04/14/2023)   budesonide-formoterol (SYMBICORT) 80-4.5 MCG/ACT inhaler Inhale 1 puff into the lungs daily. (Patient not taking: Reported on 04/14/2023)   furosemide (LASIX) 40 MG tablet Take 1 tablet (40 mg total) by mouth daily for 2 days. (Patient not taking: Reported on 04/14/2023)   [DISCONTINUED] ipratropium-albuterol (DUONEB) 0.5-2.5 (3) MG/3ML SOLN Take 3 mLs by nebulization daily.   [DISCONTINUED] potassium chloride (KLOR-CON) 20 MEQ packet Take 20 mEq by  mouth 2 (two) times daily.   No facility-administered encounter medications on file as of 04/14/2023.    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     Fibromyalgia    GERD 12/19/2006   Qualifier: Diagnosis of  By: Renaldo Fiddler CMA, Marliss Czar  04/13/16 TSH 1.20, Na 141, K 4.2, Bun 15, creat 0.79, wbc 5.5, Hgb 11.5, plt 283    GERD (gastroesophageal reflux disease)    Hammer toe of second toe of left foot 04/08/2016   Left 2nd   Hearing loss    Hemorrhoids    Hypercholesterolemia    Hypertension    Osteoarthritis 12/19/2006   Qualifier: Diagnosis of  By: Renaldo Fiddler CMA, Leigh     Osteoporosis    Past Surgical History:  Procedure Laterality Date   BIOPSY  02/17/2020   Procedure: BIOPSY;  Surgeon: Lynann Bologna, MD;  Location: [facility];  Service: Endoscopy;;   CATARACT EXTRACTION, BILATERAL  2009   COLONOSCOPY  2009, 2006 , 2017   ESOPHAGOGASTRODUODENOSCOPY (EGD) WITH PROPOFOL N/A 02/17/2020   Procedure: ESOPHAGOGASTRODUODENOSCOPY (EGD) WITH PROPOFOL;  Surgeon: Lynann Bologna, MD;  Location: [facility];  Service: Endoscopy;  Laterality: N/A;   IR ANGIOGRAM SELECTIVE EACH ADDITIONAL VESSEL  02/17/2020   IR  ANGIOGRAM SELECTIVE EACH ADDITIONAL VESSEL  02/17/2020   IR ANGIOGRAM SELECTIVE EACH ADDITIONAL VESSEL  02/17/2020   IR ANGIOGRAM SELECTIVE EACH ADDITIONAL VESSEL  02/17/2020   IR ANGIOGRAM SELECTIVE EACH ADDITIONAL VESSEL  02/17/2020   IR ANGIOGRAM VISCERAL SELECTIVE  02/17/2020   IR ANGIOGRAM VISCERAL SELECTIVE  02/17/2020   IR US GUIDE VASC ACCESS RIGHT  02/17/2020   stapes surgery     Dr Dorma Russell   TONSILLECTOMY AND ADENOIDECTOMY     as a child   Family History  Problem Relation Age of Onset   Heart disease Father    COPD Father    Hypertension Mother    Arthritis Mother    Social History   Socioeconomic History   Marital status: Widowed    Spouse name: Not on file   Number of children: Not on file   Years of education: Not on file   Highest education level: Not on file  Occupational History   Occupation: retired   Occupation: works some weekends at [employer]  Tobacco Use   Smoking status: Former    Current packs/day: 0.00    Types: Cigarettes    Quit date: 02/15/2010    Years since quitting: 13.1   Smokeless tobacco: Never  Vaping Use   Vaping status: Never Used  Substance and Sexual Activity   Alcohol use: Yes    Comment: occasional glass of wine   Drug use: No   Sexual activity: Never  Other Topics Concern   Not on file  Social History Narrative   Pt works some weekends at [employer]   Caffeine use - daily coffee in the mornings   Patient gets regular exercise > tries to walk daily for exercise   Moved [place] 03/11/16   Widowed   Former Smoker-stopped 2012   Alcohol occasionally glass of wine   Living Will   Social Drivers of Corporate investment banker Strain: Low Risk  (11/08/2017)   Overall Financial Resource Strain (CARDIA)    Difficulty of Paying Living Expenses: Not hard at all  Food Insecurity: No Food Insecurity (11/08/2017)   Hunger Vital Sign    Worried About Running Out of Food  in the Last Year: Never true    Ran Out of  Food in the Last Year: Never true  Transportation Needs: No Transportation Needs (11/08/2017)   PRAPARE - Administrator, Civil Service (Medical): No    Lack of Transportation (Non-Medical): No  Physical Activity: Sufficiently Active (11/08/2017)   Exercise Vital Sign    Days of Exercise per Week: 5 days    Minutes of Exercise per Session: 30 min  Stress: No Stress Concern Present (11/08/2017)   [employer] of Occupational Health - Occupational Stress Questionnaire    Feeling of Stress : Not at all  Social Connections: Moderately Isolated (11/08/2017)   Social Connection and Isolation Panel [NHANES]    Frequency of Communication with Friends and Family: More than three times a week    Frequency of Social Gatherings with Friends and Family: Twice a week    Attends Religious Services: Never    Database administrator or Organizations: No    Attends Banker Meetings: Never    Marital Status: Widowed    Tobacco Counseling Counseling given: Not Answered   Clinical Intake:  Pre-visit preparation completed: Yes  Pain : 0-10 Pain Score: 0-No pain Pain Type: Chronic pain Pain Location: Back Pain Orientation: Mid Pain Descriptors / Indicators: Aching Pain Onset: More than a month ago Pain Frequency: Intermittent Pain Relieving Factors: Morphine, Tylenol, rest. Effect of Pain on Daily Activities: none  Pain Relieving Factors: Morphine, Tylenol, rest.  BMI - recorded: 30.86 Nutritional Status: BMI > 30  Obese Nutritional Risks: None Diabetes: No  How often do you need to have someone help you when you read instructions, pamphlets, or other written materials from your doctor or pharmacy?: 5 - Always What is the last grade level you completed in school?: college  Interpreter Needed?: No  Information entered by :: Agam Davenport Nedra Hai NP   Activities of Daily Living    04/14/2023    1:14 PM  In your present state of health, do you have any difficulty  performing the following activities:  Hearing? 1  Comment hearing aids  Vision? 0  Difficulty concentrating or making decisions? 1  Walking or climbing stairs? 1  Dressing or bathing? 1  Doing errands, shopping? 1  Preparing Food and eating ? N  Using the Toilet? N  In the past six months, have you accidently leaked urine? Y  Do you have problems with loss of bowel control? Y  Managing your Medications? Y  Managing your Finances? Y  Housekeeping or managing your Housekeeping? Y    Patient Care Team: Miloh Alcocer X, NP as PCP - General (Internal Medicine) Brooke Payes X, NP as Nurse Practitioner (Internal Medicine)  Indicate any recent Medical Services you may have received from other than [facility] providers in the past year (date may be approximate).     Assessment:   This is a routine wellness examination for [place].  Hearing/Vision screen No results found.   Goals Addressed             This Visit's Progress    Chronic Pain Managed       Evidence-based guidance:  Address common beliefs about pain, such as pain is to be endured, a normal part of aging or that it is not "real"; feelings of resignation that nothing can be done and that complaining will be a sign of weakness.  Assess pain level, treatment efficacy and patient response at regular intervals using a consistent pain scale.  Assess pain using self-report (most reliable), family/caregiver report, validated pain scale; consider impact on quality of life.  Determine if pain is associated with mobility or at rest, location, intensity, frequency, duration, recurrence, pattern and description (e.g., cramping, burning, aching), triggers and relieving factors.   Anticipate referral to pain education program, pain management support or community resources for specific diagnoses (e.g., cancer, fibromyalgia, multiple sclerosis).  Explore fears associated with anticipated or imagined pain; encourage acceptance-based approaches.   Encourage exposure to experiences previously avoided due to fear of pain.  Anticipate referral to pain management specialist, physical therapist, addiction specialist (if history of substance use), psychotherapist; advocate for consultation with pharmacist.  Initiate nonpharmacologic measures, such as cognitive behavior therapy, mindfulness, guided imagery, massage, distraction, relaxation, chiropractic manipulation, dietary supplements or acupuncture.  Provide multimodal treatment interventions, such as physical activity, therapeutic exercise, yoga, TENS (transcutaneous electrical nerve stimulation) and manual therapy.  Train in functional activity modifications, such as body mechanics, posture, ergonomics, energy conservation and activity pacing.  Encourage use of local anesthetic or analgesic therapy as an adjunct for pain control (e.g., lidocaine patch, capsaicin cream, topical nonsteroidal anti-inflammatory drugs).  Prepare patient for use of pharmacologic therapy in a stepped approach that may include acetaminophen, nonsteroidal anti-inflammatory drugs, opioid, antiepileptic, antidepressant or nonbenzodiazepine muscle relaxant.  Review efficacy, tolerability, adherence and manage medication-induced side effects.   Notes:        Depression Screen    04/14/2023    1:16 PM 02/05/2022    9:16 AM 11/08/2017   12:01 PM 11/05/2016    9:54 AM 09/01/2015    9:27 AM 08/31/2012   10:00 AM  PHQ 2/9 Scores  PHQ - 2 Score 0 0 0 0 0 0    Fall Risk    07/23/2022    3:44 PM 03/09/2022   10:01 AM 02/05/2022    9:16 AM 11/08/2017   12:01 PM 11/05/2016    9:54 AM  Fall Risk   Falls in the past year? 0 0 0 No Yes  Number falls in past yr: 0 0 0  2 or more  Injury with Fall? 0 0 0  Yes  Risk for fall due to : No Fall Risks No Fall Risks History of fall(s)    Follow up Falls evaluation completed Falls evaluation completed Falls evaluation completed      MEDICARE RISK AT HOME: Medicare Risk at  Home Any stairs in or around the home?: Yes If so, are there any without handrails?: No Home free of loose throw rugs in walkways, pet beds, electrical cords, etc?: Yes Adequate lighting in your home to reduce risk of falls?: Yes Life alert?: No Use of a cane, walker or w/c?: Yes Grab bars in the bathroom?: Yes Shower chair or bench in shower?: Yes Elevated toilet seat or a handicapped toilet?: Yes  TIMED UP AND GO:  Was the test performed?  No    Cognitive Function:    03/02/2022    1:03 PM 02/12/2021   10:24 AM 12/29/2017    8:10 AM 11/08/2017   12:03 PM 11/05/2016   11:02 AM  MMSE - Mini Mental State Exam  Orientation to time 5 5 5 5 5   Orientation to Place 5 5 5 5 5   Registration 3 3 3 3 3   Attention/ Calculation 3 5 5 5 5   Recall 3 3 0 1 2  Language- name 2 objects 2 2 2 2 2   Language- repeat 1 1 1 1 1   Language- follow  3 step command 2 3 3 3 3   Language- read & follow direction 1 1 1 1 1   Write a sentence 1 1 1 1 1   Copy design 1 0 1 1 1   Total score 27 29 27 28 29         Immunizations Immunization History  Administered Date(s) Administered   Moderna SARS-COV2 Booster Vaccination 12/25/2019   Moderna Sars-Covid-2 Vaccination 02/17/2019, 03/17/2019   Pneumococcal Conjugate-13 09/01/2015   Pneumococcal Polysaccharide-23 07/07/1998   Td 08/21/2009   Tdap 09/22/2022    TDAP status: Up to date  Flu Vaccine status: Up to date  Pneumococcal vaccine status: Declined,  Education has been provided regarding the importance of this vaccine but patient still declined. Advised may receive this vaccine at local pharmacy or Health Dept. Aware to provide a copy of the vaccination record if obtained from local pharmacy or Health Dept. Verbalized acceptance and understanding.   Covid-19 vaccine status: Completed vaccines  Qualifies for Shingles Vaccine? Yes   Zostavax completed Yes   Shingrix Completed?: Yes  Screening Tests Health Maintenance  Topic Date Due    DTaP/Tdap/Td (3 - Td or Tdap) 09/21/2032   DEXA SCAN  Completed   HPV VACCINES  Aged Out   Pneumonia Vaccine 35+ Years old  Discontinued   INFLUENZA VACCINE  Discontinued   COVID-19 Vaccine  Discontinued   Zoster Vaccines- Shingrix  Discontinued    Health Maintenance  There are no preventive care reminders to display for this patient.  Colorectal cancer screening: No longer required.   Mammogram status: No longer required due to aged out.  Bone Density status: Completed 2018. Results reflect: Bone density results: OSTEOPOROSIS. Repeat every never per patient's choice years.  Lung Cancer Screening: (Low Dose CT Chest recommended if Age 63-80 years, 20 pack-year currently smoking OR have quit w/in 15years.) does not qualify.   Lung Cancer Screening Referral: NA   Additional Screening:  Hepatitis C Screening: does not qualify;   Vision Screening: Recommended annual ophthalmology exams for early detection of glaucoma and other disorders of the eye. Is the patient up to date with their annual eye exam?  No  Who is the provider or what is the name of the office in which the patient attends annual eye exams? HPOA/patient will provide if desires If pt is not established with a provider, would they like to be referred to a provider to establish care? No .   Dental Screening: Recommended annual dental exams for proper oral hygiene  Diabetic Foot Exam: NA  Community Resource Referral / Chronic Care Management: CRR required this visit?  No   CCM required this visit?  No     Plan:     I have personally reviewed and noted the following in the patient's chart:   Medical and social history Use of alcohol, tobacco or illicit drugs  Current medications and supplements including opioid prescriptions. Patient is currently taking opioid prescriptions. Information provided to patient regarding non-opioid alternatives. Patient advised to discuss non-opioid treatment plan with their  provider. Functional ability and status Nutritional status Physical activity Advanced directives List of other physicians Hospitalizations, surgeries, and ER visits in previous 12 months Vitals Screenings to include cognitive, depression, and falls Referrals and appointments  In addition, I have reviewed and discussed with patient certain preventive protocols, quality metrics, and best practice recommendations. A written personalized care plan for preventive services as well as general preventive health recommendations were provided to patient.     Tiane Szydlowski X  Pualani Borah, NP   04/19/2023   After Visit Summary: (In Person-Declined) Patient declined AVS at this time.

## 2023-04-15 DIAGNOSIS — F02A4 Dementia in other diseases classified elsewhere, mild, with anxiety: Secondary | ICD-10-CM | POA: Diagnosis not present

## 2023-04-15 DIAGNOSIS — J309 Allergic rhinitis, unspecified: Secondary | ICD-10-CM | POA: Diagnosis not present

## 2023-04-15 DIAGNOSIS — J449 Chronic obstructive pulmonary disease, unspecified: Secondary | ICD-10-CM | POA: Diagnosis not present

## 2023-04-15 DIAGNOSIS — N1831 Chronic kidney disease, stage 3a: Secondary | ICD-10-CM | POA: Diagnosis not present

## 2023-04-15 DIAGNOSIS — I5020 Unspecified systolic (congestive) heart failure: Secondary | ICD-10-CM | POA: Diagnosis not present

## 2023-04-15 DIAGNOSIS — H900 Conductive hearing loss, bilateral: Secondary | ICD-10-CM | POA: Diagnosis not present

## 2023-04-16 DIAGNOSIS — J309 Allergic rhinitis, unspecified: Secondary | ICD-10-CM | POA: Diagnosis not present

## 2023-04-16 DIAGNOSIS — K219 Gastro-esophageal reflux disease without esophagitis: Secondary | ICD-10-CM | POA: Diagnosis not present

## 2023-04-16 DIAGNOSIS — F32A Depression, unspecified: Secondary | ICD-10-CM | POA: Diagnosis not present

## 2023-04-16 DIAGNOSIS — H900 Conductive hearing loss, bilateral: Secondary | ICD-10-CM | POA: Diagnosis not present

## 2023-04-16 DIAGNOSIS — F02A4 Dementia in other diseases classified elsewhere, mild, with anxiety: Secondary | ICD-10-CM | POA: Diagnosis not present

## 2023-04-16 DIAGNOSIS — J42 Unspecified chronic bronchitis: Secondary | ICD-10-CM | POA: Diagnosis not present

## 2023-04-16 DIAGNOSIS — I5020 Unspecified systolic (congestive) heart failure: Secondary | ICD-10-CM | POA: Diagnosis not present

## 2023-04-16 DIAGNOSIS — D649 Anemia, unspecified: Secondary | ICD-10-CM | POA: Diagnosis not present

## 2023-04-16 DIAGNOSIS — G4700 Insomnia, unspecified: Secondary | ICD-10-CM | POA: Diagnosis not present

## 2023-04-16 DIAGNOSIS — J449 Chronic obstructive pulmonary disease, unspecified: Secondary | ICD-10-CM | POA: Diagnosis not present

## 2023-04-16 DIAGNOSIS — R12 Heartburn: Secondary | ICD-10-CM | POA: Diagnosis not present

## 2023-04-16 DIAGNOSIS — N1831 Chronic kidney disease, stage 3a: Secondary | ICD-10-CM | POA: Diagnosis not present

## 2023-04-18 DIAGNOSIS — J309 Allergic rhinitis, unspecified: Secondary | ICD-10-CM | POA: Diagnosis not present

## 2023-04-18 DIAGNOSIS — I5020 Unspecified systolic (congestive) heart failure: Secondary | ICD-10-CM | POA: Diagnosis not present

## 2023-04-18 DIAGNOSIS — H900 Conductive hearing loss, bilateral: Secondary | ICD-10-CM | POA: Diagnosis not present

## 2023-04-18 DIAGNOSIS — N1831 Chronic kidney disease, stage 3a: Secondary | ICD-10-CM | POA: Diagnosis not present

## 2023-04-18 DIAGNOSIS — J449 Chronic obstructive pulmonary disease, unspecified: Secondary | ICD-10-CM | POA: Diagnosis not present

## 2023-04-18 DIAGNOSIS — F02A4 Dementia in other diseases classified elsewhere, mild, with anxiety: Secondary | ICD-10-CM | POA: Diagnosis not present

## 2023-04-19 ENCOUNTER — Encounter: Payer: Self-pay | Admitting: Nurse Practitioner

## 2023-04-19 DIAGNOSIS — I5020 Unspecified systolic (congestive) heart failure: Secondary | ICD-10-CM | POA: Diagnosis not present

## 2023-04-19 DIAGNOSIS — F02A4 Dementia in other diseases classified elsewhere, mild, with anxiety: Secondary | ICD-10-CM | POA: Diagnosis not present

## 2023-04-19 DIAGNOSIS — N1831 Chronic kidney disease, stage 3a: Secondary | ICD-10-CM | POA: Diagnosis not present

## 2023-04-19 DIAGNOSIS — J449 Chronic obstructive pulmonary disease, unspecified: Secondary | ICD-10-CM | POA: Diagnosis not present

## 2023-04-19 DIAGNOSIS — J309 Allergic rhinitis, unspecified: Secondary | ICD-10-CM | POA: Diagnosis not present

## 2023-04-19 DIAGNOSIS — H900 Conductive hearing loss, bilateral: Secondary | ICD-10-CM | POA: Diagnosis not present

## 2023-04-21 DIAGNOSIS — I5020 Unspecified systolic (congestive) heart failure: Secondary | ICD-10-CM | POA: Diagnosis not present

## 2023-04-21 DIAGNOSIS — F02A4 Dementia in other diseases classified elsewhere, mild, with anxiety: Secondary | ICD-10-CM | POA: Diagnosis not present

## 2023-04-21 DIAGNOSIS — H900 Conductive hearing loss, bilateral: Secondary | ICD-10-CM | POA: Diagnosis not present

## 2023-04-21 DIAGNOSIS — N1831 Chronic kidney disease, stage 3a: Secondary | ICD-10-CM | POA: Diagnosis not present

## 2023-04-21 DIAGNOSIS — J449 Chronic obstructive pulmonary disease, unspecified: Secondary | ICD-10-CM | POA: Diagnosis not present

## 2023-04-21 DIAGNOSIS — J309 Allergic rhinitis, unspecified: Secondary | ICD-10-CM | POA: Diagnosis not present

## 2023-04-22 ENCOUNTER — Non-Acute Institutional Stay (SKILLED_NURSING_FACILITY): Payer: Self-pay | Admitting: Nurse Practitioner

## 2023-04-22 ENCOUNTER — Encounter: Payer: Self-pay | Admitting: Nurse Practitioner

## 2023-04-22 DIAGNOSIS — K5901 Slow transit constipation: Secondary | ICD-10-CM

## 2023-04-22 DIAGNOSIS — J4489 Other specified chronic obstructive pulmonary disease: Secondary | ICD-10-CM

## 2023-04-22 DIAGNOSIS — R7303 Prediabetes: Secondary | ICD-10-CM | POA: Diagnosis not present

## 2023-04-22 DIAGNOSIS — N390 Urinary tract infection, site not specified: Secondary | ICD-10-CM

## 2023-04-22 DIAGNOSIS — F419 Anxiety disorder, unspecified: Secondary | ICD-10-CM

## 2023-04-22 DIAGNOSIS — I509 Heart failure, unspecified: Secondary | ICD-10-CM

## 2023-04-22 DIAGNOSIS — M797 Fibromyalgia: Secondary | ICD-10-CM

## 2023-04-22 DIAGNOSIS — H900 Conductive hearing loss, bilateral: Secondary | ICD-10-CM | POA: Diagnosis not present

## 2023-04-22 DIAGNOSIS — N1831 Chronic kidney disease, stage 3a: Secondary | ICD-10-CM

## 2023-04-22 DIAGNOSIS — J309 Allergic rhinitis, unspecified: Secondary | ICD-10-CM | POA: Diagnosis not present

## 2023-04-22 DIAGNOSIS — D62 Acute posthemorrhagic anemia: Secondary | ICD-10-CM | POA: Diagnosis not present

## 2023-04-22 DIAGNOSIS — F5105 Insomnia due to other mental disorder: Secondary | ICD-10-CM | POA: Diagnosis not present

## 2023-04-22 DIAGNOSIS — J449 Chronic obstructive pulmonary disease, unspecified: Secondary | ICD-10-CM | POA: Diagnosis not present

## 2023-04-22 DIAGNOSIS — I5020 Unspecified systolic (congestive) heart failure: Secondary | ICD-10-CM | POA: Diagnosis not present

## 2023-04-22 DIAGNOSIS — K219 Gastro-esophageal reflux disease without esophagitis: Secondary | ICD-10-CM | POA: Diagnosis not present

## 2023-04-22 DIAGNOSIS — F02A4 Dementia in other diseases classified elsewhere, mild, with anxiety: Secondary | ICD-10-CM | POA: Diagnosis not present

## 2023-04-22 NOTE — Progress Notes (Signed)
 Location:   SNF [facility] Room Number: 60A Place of Service:  SNF (31) Provider: Arna Snipe Khary Schaben NP  Ulyana Pitones X, NP  Patient Care Team: Shironda Kain X, NP as PCP - General (Internal Medicine) Elliemae Braman X, NP as Nurse Practitioner (Internal Medicine)  Extended Emergency Contact Information Primary Emergency Contact: Varricchio,Jeff Home Phone: [phone] Relation: Son Secondary Emergency Contact: Culbreth Sr.,Christopher Home Phone: [phone] Relation: Relative  Code Status:  DNR Goals of care: Advanced Directive information    04/14/2023    3:23 PM  Advanced Directives  Does Patient Have a Medical Advance Directive? Yes  Type of Advance Directive Living will;Out of facility DNR (pink MOST or yellow form)  Does patient want to make changes to medical advance directive? No - Patient declined  Pre-existing out of facility DNR order (yellow form or pink MOST form) Yellow form placed in chart (order not valid for inpatient use)     Chief Complaint  Patient presents with   Medical Management of Chronic Issues    HPI:  Pt is a 89 y.o. female seen today for medical management of chronic diseases.    Gait abnormality, walker for ambulation,  risk falling             CHF clinically presumed. 08/16/22 Echo mild aortic stenosis, normal left ventricular systolic function, no EF.  BNP in 364 08/12/22>>248 09/21/22, chronic edema BLE, on Furosemide. (CXR showed central pulmonary venous congestion w/o frank pulmonary edema, chronic lung disease, no acute findings of osseous structures)             COPD, intermittent chronic mild expiratory wheezes, c/o SOB, uses O2,  takes Benzonatate, DuoNeb, prn Albuterol/Budsonide, cough is chronic, on Prednisone low dose. treated with higher dose of Prednisone for symptomatic control.  Hx of CVA, CT x2 03/2021 showed no acute findings except age indeterminate right corona radiator infarct, small vessel changes. Neurology recommended no further work ups.               Macular degeneration, f/u [facility]: OU for [place] MD involving central area. OS exudative age related MD             Chronic lower back, leg pain,  R shoulder pain, had Ortho eval, takes Lyrica, Tylenol, Norco, Morphine             HTN, loose control Bp, Sbp 140-150s intermittently, off  Metoprolol.              Urinary symptoms, on  Nitrofurantoin 50mg  qd for UTI suppression, Dr. Vernie Ammons             Anemia, Hx of Hgb 6.8 in hospital s/p 2 units of PRBC, on  Vit B12, Iron, Hgb 9.8 01/11/23<<10.5 01/20/23             CKD Bun/creat 19/1.13 01/20/23             GERD/chronic gastritis/atrophic gastritis, GI bleed 02/2020, s/p EGD, per biopsy, takes PPI,  F/u GI prn, taking Omeprazole.  Fibromyalgia takes Norco, lyrica, Morphine.              Tactile hallucinations, Hx of, off meds. 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     Fibromyalgia    GERD 12/19/2006   Qualifier: Diagnosis of  By: Renaldo Fiddler CMA, Marliss Czar  04/13/16 TSH 1.20, Na 141, K 4.2, Bun 15, creat 0.79, wbc 5.5, Hgb 11.5, plt 283    GERD (gastroesophageal reflux disease)    Hammer  toe of second toe of left foot 04/08/2016   Left 2nd   Hearing loss    Hemorrhoids    Hypercholesterolemia    Hypertension    Osteoarthritis 12/19/2006   Qualifier: Diagnosis of  By: Renaldo Fiddler CMA, Leigh     Osteoporosis    Past Surgical History:  Procedure Laterality Date   BIOPSY  02/17/2020   Procedure: BIOPSY;  Surgeon: Lynann Bologna, MD;  Location: [facility];  Service: Endoscopy;;   CATARACT EXTRACTION, BILATERAL  2009   COLONOSCOPY  2009, 2006 , 2017   ESOPHAGOGASTRODUODENOSCOPY (EGD) WITH PROPOFOL N/A 02/17/2020   Procedure: ESOPHAGOGASTRODUODENOSCOPY (EGD) WITH PROPOFOL;  Surgeon: Lynann Bologna, MD;  Location: [facility];  Service: Endoscopy;  Laterality: N/A;   IR ANGIOGRAM SELECTIVE EACH ADDITIONAL VESSEL  02/17/2020   IR ANGIOGRAM SELECTIVE EACH ADDITIONAL VESSEL  02/17/2020   IR ANGIOGRAM SELECTIVE EACH ADDITIONAL VESSEL  02/17/2020   IR ANGIOGRAM SELECTIVE EACH ADDITIONAL VESSEL  02/17/2020   IR ANGIOGRAM SELECTIVE EACH ADDITIONAL VESSEL  02/17/2020   IR ANGIOGRAM VISCERAL SELECTIVE  02/17/2020   IR ANGIOGRAM VISCERAL SELECTIVE  02/17/2020   IR US GUIDE VASC ACCESS RIGHT  02/17/2020   stapes surgery     Dr Dorma Russell   TONSILLECTOMY AND ADENOIDECTOMY     as a child    Allergies  Allergen Reactions   Penicillins Anaphylaxis   Bisphosphonates Other (See Comments)    [name]   Influenza Vaccines     Unknown   Simvastatin     Unknown    Trazodone And Nefazodone     Felt her throat was closing up/kept her awake   Sulfonamide Derivatives Rash and Other (See Comments)    blisters    Allergies as of 04/22/2023       Reactions   Penicillins Anaphylaxis   Bisphosphonates Other (See Comments)   [name]   Influenza Vaccines    Unknown   Simvastatin    Unknown    Trazodone And Nefazodone    Felt her throat was closing up/kept her awake   Sulfonamide Derivatives Rash, Other (See Comments)   blisters        Medication List        Accurate as of April 22, 2023  11:59  PM. If you have any questions, ask your nurse or doctor.          acetaminophen 325 MG tablet Commonly known as: TYLENOL Take 650 mg by mouth every 4 (four) hours as needed for fever.  Give 650 mg by mouth at bedtime   albuterol 108 (90 Base) MCG/ACT inhaler Commonly known as: VENTOLIN HFA Inhale into the lungs every 6 (six) hours as needed for wheezing or shortness of breath.   Albuterol-Budesonide 90-80 MCG/ACT Aero Inhale 2 puffs into the lungs every 6 (six) hours as needed (shortness of breath/ wheezing).   Antacid 750 MG chewable tablet Generic drug: calcium carbonate Chew 1 tablet by mouth daily.   benzonatate 100 MG capsule Commonly known as: TESSALON Take 1 capsule (100 mg total) by mouth 2 (two) times daily as needed for cough.   budesonide-formoterol 80-4.5 MCG/ACT inhaler Commonly known as: SYMBICORT Inhale 1 puff into the lungs daily.   budesonide-formoterol 160-4.5 MCG/ACT inhaler Commonly known as: SYMBICORT Inhale 2 puffs into the lungs daily.   cholecalciferol 1000 units tablet Commonly known as: VITAMIN D Take 1,000 Units by mouth daily.   cyanocobalamin 1000 MCG tablet Commonly known as: VITAMIN B12 Take 1,000 mcg by mouth daily.   docusate sodium 100 MG capsule Commonly known as: COLACE Take 100 mg by mouth 2 (two) times daily.   doxycycline 100 MG tablet Commonly known as: VIBRA-TABS Take 100 mg by mouth every 12 (twelve) hours.   escitalopram 20 MG tablet Commonly known as: LEXAPRO Take 20 mg by mouth at bedtime.   feeding supplement (PRO-STAT 64) Liqd Take 30 mLs by mouth daily.   ferrous sulfate 325 (65 FE) MG tablet Take 325 mg by mouth. Once A Day on Mon, Wed, Fri   furosemide 40 MG tablet Commonly known as: LASIX Take 40 mg by mouth 2 (two) times daily.   furosemide 40 MG tablet Commonly known as: LASIX Take 1 tablet (40 mg total) by mouth daily for 2 days.   HYDROcodone-acetaminophen 5-325 MG tablet Commonly known  as: NORCO/VICODIN Take 1 tablet by mouth every 6 (six) hours as needed for moderate pain (pain score 4-6).   hydrocortisone cream 1 % Apply 1 application topically 2 (two) times daily as needed (wrists).   hydrocortisone valerate cream 0.2 % Commonly known as: WESTCORT Apply 1 application topically 2 (two) times daily as needed (for itching). Cleanse behind ears with gentle cleanser and dry completely.   ipratropium-albuterol 0.5-2.5 (3) MG/3ML Soln Commonly known as: DUONEB Take 3 mLs by nebulization in the morning and at bedtime. And 1 vial inhale orally every 24 hours as needed for Wheezing   ketoconazole 2 % cream Commonly known as: NIZORAL Apply 1 application  topically 2 (two) times daily as needed for irritation.   morphine CONCENTRATE 10 mg / 0.5 ml concentrated solution   nitrofurantoin 50 MG capsule Commonly known as: MACRODANTIN Take 50 mg by mouth at bedtime.   Olopatadine HCl 0.2 % Soln Place 1 drop into both eyes daily.   omeprazole 20 MG capsule Commonly known as: PRILOSEC Take 20 mg by mouth daily.   ondansetron 4 MG tablet Commonly known as: ZOFRAN Take 4 mg by mouth every 8 (eight) hours as needed for nausea or vomiting.   polyethylene glycol 17 g packet Commonly known as: MIRALAX / GLYCOLAX Take 17 g by mouth 2 (two) times a week. Sunday and Wednesday   potassium chloride SA 20 MEQ tablet Commonly known as: KLOR-CON M Take 20  mEq by mouth 2 (two) times daily.   predniSONE 5 MG tablet Commonly known as: DELTASONE Take 5 mg by mouth daily with breakfast.   pregabalin 75 MG capsule Commonly known as: LYRICA Take 1 capsule (75 mg total) by mouth at bedtime.   Sarna Sensitive 1 % Lotn Generic drug: pramoxine Apply to cheeks topically one time a day for rosy burning cheeks   Systane Complete 0.6 % Soln Generic drug: Propylene Glycol Instill 1 drop in both eyes four times a day for DRY EYE   Voltaren 1 % Gel Generic drug: diclofenac  Sodium Apply 2 g topically every 8 (eight) hours as needed.   zolpidem 5 MG tablet Commonly known as: Ambien Take 1 tablet (5 mg total) by mouth at bedtime.        Review of Systems  Constitutional:  Negative for appetite change, fatigue and fever.  HENT:  Positive for hearing loss. Negative for congestion and voice change.   Eyes:  Negative for visual disturbance.       C/o blurred vision, ? Color, ? Right eye peripheral vision loss-f/u Ophthalmology  Respiratory:  Positive for cough and shortness of breath. Negative for chest tightness and wheezing.        DOE, chronic hacking cough  Cardiovascular:  Positive for leg swelling. Negative for chest pain and palpitations.  Gastrointestinal:  Negative for abdominal pain and constipation.  Genitourinary:  Negative for dysuria, genital sores and urgency.       Chronic, on and off  Musculoskeletal:  Positive for arthralgias, back pain, gait problem and myalgias. Negative for joint swelling.  Skin:  Positive for wound.       Skin tear  Neurological:  Negative for speech difficulty, weakness and light-headedness.  Psychiatric/Behavioral:  Negative for behavioral problems, hallucinations and sleep disturbance.        Chronic going to bed late, sleeping in late.     Immunization History  Administered Date(s) Administered   Moderna SARS-COV2 Booster Vaccination 12/25/2019   Moderna Sars-Covid-2 Vaccination 02/17/2019, 03/17/2019   Pneumococcal Conjugate-13 09/01/2015   Pneumococcal Polysaccharide-23 07/07/1998   Td 08/21/2009   Tdap 09/22/2022   Pertinent  Health Maintenance Due  Topic Date Due   DEXA SCAN  Completed   INFLUENZA VACCINE  Discontinued      03/20/2021   12:00 AM 03/20/2021   12:00 PM 02/05/2022    9:16 AM 03/09/2022   10:01 AM 07/23/2022    3:44 PM  Fall Risk  Falls in the past year?   0 0 0  Was there an injury with Fall?   0 0 0  Fall Risk Category Calculator   0 0 0  Fall Risk Category (Retired)   Low     (RETIRED) Patient Fall Risk Level High fall risk High fall risk High fall risk    Patient at Risk for Falls Due to   History of fall(s) No Fall Risks No Fall Risks  Fall risk Follow up   Falls evaluation completed Falls evaluation completed Falls evaluation completed   Functional Status Survey:    Vitals:   04/22/23 1119 04/25/23 1219  BP: (!) 146/68 (!) 146/68  Pulse: 61   Resp: (!) 22   Temp: (!) 97.1 F (36.2 C)   SpO2: 98%   Weight: 156 lb 9.6 oz (71 kg)    Body mass index is 30.58 kg/m. Physical Exam Vitals and nursing note reviewed.  Constitutional:      Appearance: Normal appearance.  HENT:  Head: Normocephalic and atraumatic.     Nose: Nose normal.     Mouth/Throat:     Mouth: Mucous membranes are moist.  Eyes:     Extraocular Movements: Extraocular movements intact.     Conjunctiva/sclera: Conjunctivae normal.     Pupils: Pupils are equal, round, and reactive to light.     Comments: Able to count my fingers 3 feet away from her eyes.   Cardiovascular:     Rate and Rhythm: Normal rate and regular rhythm.     Heart sounds: No murmur heard. Pulmonary:     Effort: Pulmonary effort is normal.     Breath sounds: Rales present. No wheezing.     Comments: Decreased air entry to both lungs. Needs O2 via [place] to maintain SatO2>90, rales bibasilar.  Abdominal:     General: Bowel sounds are normal.     Palpations: Abdomen is soft.     Tenderness: There is no abdominal tenderness.  Musculoskeletal:        General: Tenderness present.     Cervical back: Normal range of motion and neck supple.     Right lower leg: Edema present.     Left lower leg: Edema present.     Comments: trace edema BLE. R shoulder pain with overhead ROM.      Skin:    General: Skin is warm and dry.     Findings: Erythema present.     Comments: Skin tear, no s/s of infection   Neurological:     General: No focal deficit present.     Mental Status: She is alert. Mental status is at  baseline.     Motor: No weakness.     Coordination: Coordination normal.     Gait: Gait abnormal.     Comments: Oriented to person, place.   Psychiatric:        Mood and Affect: Mood normal.        Behavior: Behavior normal.     Labs reviewed: Recent Labs    07/31/22 1448 08/13/22 0000 08/24/22 0000 11/09/22 0000 01/20/23 0000  NA 137   < > 141 139 139  K 3.6   < > 4.1 3.7 3.6  CL 100   < > 103 98* 98*  CO2 27   < > 31* 32* 33*  GLUCOSE 112*  --   --   --   --   BUN 16   < > 18 20 19   CREATININE 1.25*   < > 1.1 1.1 1.1  CALCIUM 9.1   < > 8.8 9.0 9.1   < > = values in this interval not displayed.   Recent Labs    07/31/22 1448 01/20/23 0000  AST 20 12*  ALT 19 12  ALKPHOS 78 117  BILITOT 1.2  --   PROT 6.1*  --   ALBUMIN 3.6 3.7   Recent Labs    07/27/22 0000 07/31/22 1448 01/20/23 0000  WBC 8.1 7.0 7.5  NEUTROABS  --   --  5,588.00  HGB 12.3 13.3 10.5*  HCT 37 41.6 33*  MCV  --  95.2  --   PLT 194 179 218   Lab Results  Component Value Date   TSH 1.03 08/13/2022   Lab Results  Component Value Date   HGBA1C 6.6 08/13/2022   Lab Results  Component Value Date   CHOL 192 03/19/2021   HDL 69 03/19/2021   LDLCALC 107 (H) 03/19/2021   LDLDIRECT 114.7 08/18/2011  TRIG 78 03/19/2021   CHOLHDL 2.8 03/19/2021    Significant Diagnostic Results in last 30 days:  No results found.  Assessment/Plan  UTI (urinary tract infection) on  Nitrofurantoin 50mg  qd for UTI suppression, Dr. Juliann Pares (acute blood loss anemia) on  Nitrofurantoin 50mg  qd for UTI suppression, Dr. Vernie Ammons  CKD (chronic kidney disease) stage 3, GFR 30-59 ml/min (HCC)  Bun/creat 19/1.13 01/20/23  GERD GERD/chronic gastritis/atrophic gastritis, GI bleed 02/2020, s/p EGD, per biopsy, takes PPI,  F/u GI prn, taking Omeprazole.   Fibromyalgia akes Norco, lyrica, Morphine.   Slow transit constipation  takes MiraLax,  Colace, stable  Prediabetes Hgb a1c 6.6  08/12/22  Insomnia secondary to anxiety Stable, takes Ambien, Lexapro           COPD (chronic obstructive pulmonary disease) with chronic bronchitis  intermittent chronic mild expiratory wheezes, c/o SOB, uses O2,  takes Benzonatate, DuoNeb, prn Albuterol/Budsonide, cough is chronic, on Prednisone low dose. treated with higher dose of Prednisone for symptomatic control.   Congestive heart failure (CHF) (HCC)  clinically presumed. 08/16/22 Echo mild aortic stenosis, normal left ventricular systolic function, no EF.  BNP in 364 08/12/22>>248 09/21/22, chronic edema BLE, on Furosemide. (CXR showed central pulmonary venous congestion w/o frank pulmonary edema, chronic lung disease, no acute findings of osseous structures)   Family/ staff Communication: plan of care reviewed with the patient and charge nurse.   Labs/tests ordered:  none

## 2023-04-25 NOTE — Assessment & Plan Note (Signed)
 clinically presumed. 08/16/22 Echo mild aortic stenosis, normal left ventricular systolic function, no EF.  BNP in 364 08/12/22>>248 09/21/22, chronic edema BLE, on Furosemide. (CXR showed central pulmonary venous congestion w/o frank pulmonary edema, chronic lung disease, no acute findings of osseous structures)

## 2023-04-25 NOTE — Assessment & Plan Note (Signed)
on  Nitrofurantoin 50mg qd for UTI suppression, Dr. Ottelin.  

## 2023-04-25 NOTE — Assessment & Plan Note (Signed)
 intermittent chronic mild expiratory wheezes, c/o SOB, uses O2,  takes Benzonatate, DuoNeb, prn Albuterol/Budsonide, cough is chronic, on Prednisone low dose. treated with higher dose of Prednisone for symptomatic control.

## 2023-04-25 NOTE — Assessment & Plan Note (Signed)
 Stable,  takes Ambien, Lexapro

## 2023-04-25 NOTE — Assessment & Plan Note (Signed)
 Bun/creat 19/1.13 01/20/23

## 2023-04-25 NOTE — Assessment & Plan Note (Signed)
 takes MiraLax,  Colace, stable

## 2023-04-25 NOTE — Assessment & Plan Note (Signed)
Hgb a1c 6.6 08/12/22

## 2023-04-25 NOTE — Assessment & Plan Note (Signed)
 akes Norco, lyrica, Morphine.

## 2023-04-25 NOTE — Assessment & Plan Note (Signed)
 GERD/chronic gastritis/atrophic gastritis, GI bleed 02/2020, s/p EGD, per biopsy, takes PPI,  F/u GI prn, taking Omeprazole.

## 2023-04-26 DIAGNOSIS — F02A4 Dementia in other diseases classified elsewhere, mild, with anxiety: Secondary | ICD-10-CM | POA: Diagnosis not present

## 2023-04-26 DIAGNOSIS — J309 Allergic rhinitis, unspecified: Secondary | ICD-10-CM | POA: Diagnosis not present

## 2023-04-26 DIAGNOSIS — J449 Chronic obstructive pulmonary disease, unspecified: Secondary | ICD-10-CM | POA: Diagnosis not present

## 2023-04-26 DIAGNOSIS — H900 Conductive hearing loss, bilateral: Secondary | ICD-10-CM | POA: Diagnosis not present

## 2023-04-26 DIAGNOSIS — I5020 Unspecified systolic (congestive) heart failure: Secondary | ICD-10-CM | POA: Diagnosis not present

## 2023-04-26 DIAGNOSIS — N1831 Chronic kidney disease, stage 3a: Secondary | ICD-10-CM | POA: Diagnosis not present

## 2023-04-27 DIAGNOSIS — H900 Conductive hearing loss, bilateral: Secondary | ICD-10-CM | POA: Diagnosis not present

## 2023-04-27 DIAGNOSIS — J309 Allergic rhinitis, unspecified: Secondary | ICD-10-CM | POA: Diagnosis not present

## 2023-04-27 DIAGNOSIS — J449 Chronic obstructive pulmonary disease, unspecified: Secondary | ICD-10-CM | POA: Diagnosis not present

## 2023-04-27 DIAGNOSIS — I5020 Unspecified systolic (congestive) heart failure: Secondary | ICD-10-CM | POA: Diagnosis not present

## 2023-04-27 DIAGNOSIS — F02A4 Dementia in other diseases classified elsewhere, mild, with anxiety: Secondary | ICD-10-CM | POA: Diagnosis not present

## 2023-04-27 DIAGNOSIS — N1831 Chronic kidney disease, stage 3a: Secondary | ICD-10-CM | POA: Diagnosis not present

## 2023-04-29 DIAGNOSIS — F02A4 Dementia in other diseases classified elsewhere, mild, with anxiety: Secondary | ICD-10-CM | POA: Diagnosis not present

## 2023-04-29 DIAGNOSIS — H900 Conductive hearing loss, bilateral: Secondary | ICD-10-CM | POA: Diagnosis not present

## 2023-04-29 DIAGNOSIS — I5020 Unspecified systolic (congestive) heart failure: Secondary | ICD-10-CM | POA: Diagnosis not present

## 2023-04-29 DIAGNOSIS — N1831 Chronic kidney disease, stage 3a: Secondary | ICD-10-CM | POA: Diagnosis not present

## 2023-04-29 DIAGNOSIS — J309 Allergic rhinitis, unspecified: Secondary | ICD-10-CM | POA: Diagnosis not present

## 2023-04-29 DIAGNOSIS — J449 Chronic obstructive pulmonary disease, unspecified: Secondary | ICD-10-CM | POA: Diagnosis not present

## 2023-05-03 DIAGNOSIS — N1831 Chronic kidney disease, stage 3a: Secondary | ICD-10-CM | POA: Diagnosis not present

## 2023-05-03 DIAGNOSIS — J309 Allergic rhinitis, unspecified: Secondary | ICD-10-CM | POA: Diagnosis not present

## 2023-05-03 DIAGNOSIS — J449 Chronic obstructive pulmonary disease, unspecified: Secondary | ICD-10-CM | POA: Diagnosis not present

## 2023-05-03 DIAGNOSIS — H900 Conductive hearing loss, bilateral: Secondary | ICD-10-CM | POA: Diagnosis not present

## 2023-05-03 DIAGNOSIS — I5020 Unspecified systolic (congestive) heart failure: Secondary | ICD-10-CM | POA: Diagnosis not present

## 2023-05-03 DIAGNOSIS — F02A4 Dementia in other diseases classified elsewhere, mild, with anxiety: Secondary | ICD-10-CM | POA: Diagnosis not present

## 2023-05-04 DIAGNOSIS — I5020 Unspecified systolic (congestive) heart failure: Secondary | ICD-10-CM | POA: Diagnosis not present

## 2023-05-04 DIAGNOSIS — H900 Conductive hearing loss, bilateral: Secondary | ICD-10-CM | POA: Diagnosis not present

## 2023-05-04 DIAGNOSIS — F02A4 Dementia in other diseases classified elsewhere, mild, with anxiety: Secondary | ICD-10-CM | POA: Diagnosis not present

## 2023-05-04 DIAGNOSIS — J309 Allergic rhinitis, unspecified: Secondary | ICD-10-CM | POA: Diagnosis not present

## 2023-05-04 DIAGNOSIS — N1831 Chronic kidney disease, stage 3a: Secondary | ICD-10-CM | POA: Diagnosis not present

## 2023-05-04 DIAGNOSIS — J449 Chronic obstructive pulmonary disease, unspecified: Secondary | ICD-10-CM | POA: Diagnosis not present

## 2023-05-06 ENCOUNTER — Non-Acute Institutional Stay (SKILLED_NURSING_FACILITY): Payer: Self-pay | Admitting: Sports Medicine

## 2023-05-06 ENCOUNTER — Encounter: Payer: Self-pay | Admitting: Sports Medicine

## 2023-05-06 DIAGNOSIS — H900 Conductive hearing loss, bilateral: Secondary | ICD-10-CM | POA: Diagnosis not present

## 2023-05-06 DIAGNOSIS — J309 Allergic rhinitis, unspecified: Secondary | ICD-10-CM | POA: Diagnosis not present

## 2023-05-06 DIAGNOSIS — F02A4 Dementia in other diseases classified elsewhere, mild, with anxiety: Secondary | ICD-10-CM | POA: Diagnosis not present

## 2023-05-06 DIAGNOSIS — M7989 Other specified soft tissue disorders: Secondary | ICD-10-CM | POA: Diagnosis not present

## 2023-05-06 DIAGNOSIS — L03115 Cellulitis of right lower limb: Secondary | ICD-10-CM | POA: Diagnosis not present

## 2023-05-06 DIAGNOSIS — I5020 Unspecified systolic (congestive) heart failure: Secondary | ICD-10-CM | POA: Diagnosis not present

## 2023-05-06 DIAGNOSIS — J449 Chronic obstructive pulmonary disease, unspecified: Secondary | ICD-10-CM | POA: Diagnosis not present

## 2023-05-06 DIAGNOSIS — N1831 Chronic kidney disease, stage 3a: Secondary | ICD-10-CM | POA: Diagnosis not present

## 2023-05-06 NOTE — Progress Notes (Signed)
 Location:  [facility] Room Number: N060-A Place of Service:  SNF (31) Provider:  Venita Sheffield, MD    Patient Care Team: Mast, Man X, NP as PCP - General (Internal Medicine) Mast, Man X, NP as Nurse Practitioner (Internal Medicine)  Extended Emergency Contact Information Primary Emergency Contact: Palmatier,Jeff Home Phone: [phone] Relation: Son Secondary Emergency Contact: Culbreth Sr.,Christopher Home Phone: [phone] Relation: Relative  Code Status:  DNR Goals of care: Advanced Directive information    05/06/2023   11:22 AM  Advanced Directives  Does Patient Have a Medical Advance Directive? Yes  Type of Advance Directive Living will;Out of facility DNR (pink MOST or yellow form)  Pre-existing out of facility DNR order (yellow form or pink MOST form) Yellow form placed in chart (order not valid for inpatient use)     Chief Complaint  Patient presents with   Acute Visit    Cellulitis    HPI:  Pt is a 89 y.o. female seen today for an acute visit for acute visit for a wound on her right lower extremity. Patient seen and examined in her room.  She is sitting in her recliner chair.  She seems pleasant and comfortable and does not appear to be in distress. Patient complains of pain in her right leg. Staff reported 2 cm open shallow wound on her right leg with yellowish discharge since few days. Patient denies fevers, chills, calf pain. Patient has chronic exertional shortness of breath and is currently on supplemental oxygen. She sleeps with her head elevated. Denies no recent change in her breathing. Patient denies abdominal pain, nausea, vomiting, dysuria, hematuria, bloody or dark-colored stools.   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     Fibromyalgia    GERD 12/19/2006   Qualifier: Diagnosis of  By: Renaldo Fiddler CMA, Marliss Czar  04/13/16 TSH 1.20, Na 141, K 4.2, Bun 15, creat 0.79, wbc 5.5, Hgb 11.5, plt 283    GERD (gastroesophageal reflux disease)    Hammer toe of second toe of left foot 04/08/2016   Left 2nd   Hearing loss    Hemorrhoids    Hypercholesterolemia    Hypertension    Osteoarthritis 12/19/2006   Qualifier: Diagnosis of  By: Renaldo Fiddler CMA, Leigh     Osteoporosis    Past Surgical History:  Procedure Laterality Date   BIOPSY  02/17/2020   Procedure: BIOPSY;  Surgeon: Lynann Bologna, MD;  Location: [facility];  Service: Endoscopy;;   CATARACT EXTRACTION, BILATERAL  2009   COLONOSCOPY  2009, 2006 , 2017   ESOPHAGOGASTRODUODENOSCOPY (EGD) WITH PROPOFOL N/A 02/17/2020   Procedure: ESOPHAGOGASTRODUODENOSCOPY (EGD) WITH PROPOFOL;  Surgeon: Lynann Bologna, MD;  Location: [facility];  Service: Endoscopy;  Laterality: N/A;  IR ANGIOGRAM SELECTIVE EACH ADDITIONAL VESSEL  02/17/2020   IR ANGIOGRAM SELECTIVE EACH ADDITIONAL VESSEL  02/17/2020   IR ANGIOGRAM SELECTIVE EACH ADDITIONAL VESSEL  02/17/2020   IR ANGIOGRAM SELECTIVE EACH ADDITIONAL VESSEL  02/17/2020   IR ANGIOGRAM SELECTIVE EACH ADDITIONAL VESSEL  02/17/2020   IR ANGIOGRAM VISCERAL SELECTIVE  02/17/2020   IR ANGIOGRAM VISCERAL SELECTIVE  02/17/2020   IR US GUIDE VASC ACCESS RIGHT  02/17/2020   stapes surgery     Dr Dorma Russell   TONSILLECTOMY AND ADENOIDECTOMY     as a child    Allergies  Allergen Reactions   Penicillins Anaphylaxis    Bisphosphonates Other (See Comments)    [name]   Influenza Vaccines     Unknown   Simvastatin     Unknown    Trazodone And Nefazodone     Felt her throat was closing up/kept her awake   Sulfonamide Derivatives Rash and Other (See Comments)    blisters    Outpatient Encounter Medications as of 05/06/2023  Medication Sig   acetaminophen (TYLENOL) 325 MG tablet Take 650 mg by mouth every 4 (four) hours as needed for fever.  Give 650 mg by mouth at bedtime   albuterol (VENTOLIN HFA) 108 (90 Base) MCG/ACT inhaler Inhale into the lungs every 6 (six) hours as needed for wheezing or shortness of breath.   Amino Acids-Protein Hydrolys (FEEDING SUPPLEMENT, PRO-STAT 64,) LIQD Take 30 mLs by mouth daily.   benzonatate (TESSALON) 100 MG capsule Take 1 capsule (100 mg total) by mouth 2 (two) times daily as needed for cough.   budesonide-formoterol (SYMBICORT) 160-4.5 MCG/ACT inhaler Inhale 2 puffs into the lungs daily.   calcium carbonate (ANTACID) 750 MG chewable tablet Chew 1 tablet by mouth daily.   cholecalciferol (VITAMIN D) 1000 UNITS tablet Take 1,000 Units by mouth daily.   diclofenac Sodium (VOLTAREN) 1 % GEL Apply 2 g topically every 8 (eight) hours as needed.   docusate sodium (COLACE) 100 MG capsule Take 100 mg by mouth 2 (two) times daily.   escitalopram (LEXAPRO) 20 MG tablet Take 20 mg by mouth at bedtime.   ferrous sulfate 325 (65 FE) MG tablet Take 325 mg by mouth. Once A Day on Mon, Wed, Fri   furosemide (LASIX) 40 MG tablet Take 40 mg by mouth 2 (two) times daily.   HYDROcodone-acetaminophen (NORCO/VICODIN) 5-325 MG tablet Take 1 tablet by mouth every 6 (six) hours as needed for moderate pain (pain score 4-6).   ipratropium-albuterol (DUONEB) 0.5-2.5 (3) MG/3ML SOLN Take 3 mLs by nebulization in the morning and at bedtime. And 1 vial inhale orally every 24 hours as needed for Wheezing   ketoconazole (NIZORAL) 2 % cream Apply 1 application  topically 2 (two) times daily as  needed for irritation.   Morphine Sulfate (MORPHINE CONCENTRATE) 10 mg / 0.5 ml concentrated solution    nitrofurantoin (MACRODANTIN) 50 MG capsule Take 50 mg by mouth at bedtime.   Olopatadine HCl 0.2 % SOLN Place 1 drop into both eyes daily.   omeprazole (PRILOSEC) 20 MG capsule Take 20 mg by mouth daily.   ondansetron (ZOFRAN) 4 MG tablet Take 4 mg by mouth every 8 (eight) hours as needed for nausea or vomiting.   polyethylene glycol (MIRALAX / GLYCOLAX) 17 g packet Take 17 g by mouth 2 (two) times a week. Sunday and Wednesday   potassium chloride SA (KLOR-CON M) 20 MEQ tablet Take 20 mEq by mouth 2 (two) times daily.   pramoxine (  SARNA SENSITIVE) 1 % LOTN Apply to cheeks topically one time a day for rosy burning cheeks   predniSONE (DELTASONE) 5 MG tablet Take 5 mg by mouth daily with breakfast.   pregabalin (LYRICA) 75 MG capsule Take 1 capsule (75 mg total) by mouth at bedtime.   Propylene Glycol (SYSTANE COMPLETE) 0.6 % SOLN Instill 1 drop in both eyes four times a day for DRY EYE   vitamin B-12 (CYANOCOBALAMIN) 1000 MCG tablet Take 1,000 mcg by mouth daily.   zolpidem (AMBIEN) 5 MG tablet Take 1 tablet (5 mg total) by mouth at bedtime.   Albuterol-Budesonide 90-80 MCG/ACT AERO Inhale 2 puffs into the lungs every 6 (six) hours as needed (shortness of breath/ wheezing). (Patient not taking: Reported on 04/14/2023)   budesonide-formoterol (SYMBICORT) 80-4.5 MCG/ACT inhaler Inhale 1 puff into the lungs daily. (Patient not taking: Reported on 04/14/2023)   doxycycline (VIBRA-TABS) 100 MG tablet Take 100 mg by mouth every 12 (twelve) hours.   furosemide (LASIX) 40 MG tablet Take 1 tablet (40 mg total) by mouth daily for 2 days. (Patient not taking: Reported on 05/06/2023)   hydrocortisone cream 1 % Apply 1 application topically 2 (two) times daily as needed (wrists). (Patient not taking: Reported on 05/06/2023)   hydrocortisone valerate cream (WESTCORT) 0.2 % Apply 1 application topically 2 (two)  times daily as needed (for itching). Cleanse behind ears with gentle cleanser and dry completely. (Patient not taking: Reported on 05/06/2023)   No facility-administered encounter medications on file as of 05/06/2023.    Review of Systems  Immunization History  Administered Date(s) Administered   Moderna SARS-COV2 Booster Vaccination 12/25/2019   Moderna Sars-Covid-2 Vaccination 02/17/2019, 03/17/2019   Pneumococcal Conjugate-13 09/01/2015   Pneumococcal Polysaccharide-23 07/07/1998   Td 08/21/2009   Tdap 09/22/2022   Pertinent  Health Maintenance Due  Topic Date Due   DEXA SCAN  Completed   INFLUENZA VACCINE  Discontinued      03/20/2021   12:00 AM 03/20/2021   12:00 PM 02/05/2022    9:16 AM 03/09/2022   10:01 AM 07/23/2022    3:44 PM  Fall Risk  Falls in the past year?   0 0 0  Was there an injury with Fall?   0 0 0  Fall Risk Category Calculator   0 0 0  Fall Risk Category (Retired)   Low    (RETIRED) Patient Fall Risk Level High fall risk High fall risk High fall risk    Patient at Risk for Falls Due to   History of fall(s) No Fall Risks No Fall Risks  Fall risk Follow up   Falls evaluation completed Falls evaluation completed Falls evaluation completed   Functional Status Survey:    Vitals:   05/06/23 1111  BP: (!) 137/59  Pulse: 86  Resp: (!) 22  Temp: (!) 97.1 F (36.2 C)  SpO2: 96%  Weight: 156 lb 9.6 oz (71 kg)  Height: 5\' 1"  (1.549 m)   Body mass index is 29.59 kg/m. Physical Exam Constitutional:      Appearance: Normal appearance.  HENT:     Head: Normocephalic and atraumatic.  Cardiovascular:     Rate and Rhythm: Normal rate and regular rhythm.  Pulmonary:     Effort: Pulmonary effort is normal. No respiratory distress.     Breath sounds: Normal breath sounds. No wheezing.  Abdominal:     General: Bowel sounds are normal. There is no distension.     Tenderness: There is no abdominal tenderness.  There is no guarding or rebound.     Comments:     Musculoskeletal:        General: Swelling present. No tenderness.     Comments: Rt worse than the left  2 cm open shallow wound with yellowish discharge  Erythema Minimal tenderness on palpation   Neurological:     Mental Status: She is alert. Mental status is at baseline.     Sensory: No sensory deficit.     Motor: No weakness.     Labs reviewed: Recent Labs    07/31/22 1448 08/13/22 0000 08/24/22 0000 11/09/22 0000 01/20/23 0000  NA 137   < > 141 139 139  K 3.6   < > 4.1 3.7 3.6  CL 100   < > 103 98* 98*  CO2 27   < > 31* 32* 33*  GLUCOSE 112*  --   --   --   --   BUN 16   < > 18 20 19   CREATININE 1.25*   < > 1.1 1.1 1.1  CALCIUM 9.1   < > 8.8 9.0 9.1   < > = values in this interval not displayed.   Recent Labs    07/31/22 1448 01/20/23 0000  AST 20 12*  ALT 19 12  ALKPHOS 78 117  BILITOT 1.2  --   PROT 6.1*  --   ALBUMIN 3.6 3.7   Recent Labs    07/27/22 0000 07/31/22 1448 01/20/23 0000  WBC 8.1 7.0 7.5  NEUTROABS  --   --  5,588.00  HGB 12.3 13.3 10.5*  HCT 37 41.6 33*  MCV  --  95.2  --   PLT 194 179 218   Lab Results  Component Value Date   TSH 1.03 08/13/2022   Lab Results  Component Value Date   HGBA1C 6.6 08/13/2022   Lab Results  Component Value Date   CHOL 192 03/19/2021   HDL 69 03/19/2021   LDLCALC 107 (H) 03/19/2021   LDLDIRECT 114.7 08/18/2011   TRIG 78 03/19/2021   CHOLHDL 2.8 03/19/2021    Significant Diagnostic Results in last 30 days:  No results found.  Assessment/Plan Cellulitis Patient complains pain in her right leg She has bilateral lower extremity swelling, right leg worse than the left She has 2 cm shallow open wound with yellowish discharge surrounded by erythema No calf tenderness Will start doxycycline 100 mg twice a day Will get CBC, BMP Instructed nurse to give that her name, daily dressing change.   Chronic lower extremity swelling Patient has bilateral lower extremity swelling Right worse on the  left Instructed patient to keep her feet elevated Use compression stockings Avoid salty foods Continue with the Lasix 40 mg twice a day Will check BMP, BNP next week.   30 min Total time spent for obtaining history,  performing a medically appropriate examination and evaluation, reviewing the tests,  documenting clinical information in the electronic or other health recor,care coordination (not separately reported)

## 2023-05-09 DIAGNOSIS — I10 Essential (primary) hypertension: Secondary | ICD-10-CM | POA: Diagnosis not present

## 2023-05-09 DIAGNOSIS — R0602 Shortness of breath: Secondary | ICD-10-CM | POA: Diagnosis not present

## 2023-05-09 DIAGNOSIS — R21 Rash and other nonspecific skin eruption: Secondary | ICD-10-CM | POA: Diagnosis not present

## 2023-05-09 LAB — CBC AND DIFFERENTIAL
HCT: 29 — AB (ref 36–46)
Hemoglobin: 9.5 — AB (ref 12.0–16.0)
Neutrophils Absolute: 6053
Platelets: 179 K/uL (ref 150–400)
WBC: 7.8

## 2023-05-09 LAB — BASIC METABOLIC PANEL WITH GFR
BUN: 22 — AB (ref 4–21)
CO2: 34 — AB (ref 13–22)
Chloride: 98 — AB (ref 99–108)
Creatinine: 1.3 — AB (ref 0.5–1.1)
Glucose: 114
Potassium: 3.8 meq/L (ref 3.5–5.1)
Sodium: 141 (ref 137–147)

## 2023-05-09 LAB — COMPREHENSIVE METABOLIC PANEL WITH GFR
Calcium: 9.3 (ref 8.7–10.7)
eGFR: 41

## 2023-05-09 LAB — CBC: RBC: 3.24 — AB (ref 3.87–5.11)

## 2023-05-10 DIAGNOSIS — N1831 Chronic kidney disease, stage 3a: Secondary | ICD-10-CM | POA: Diagnosis not present

## 2023-05-10 DIAGNOSIS — F02A4 Dementia in other diseases classified elsewhere, mild, with anxiety: Secondary | ICD-10-CM | POA: Diagnosis not present

## 2023-05-10 DIAGNOSIS — J449 Chronic obstructive pulmonary disease, unspecified: Secondary | ICD-10-CM | POA: Diagnosis not present

## 2023-05-10 DIAGNOSIS — I5020 Unspecified systolic (congestive) heart failure: Secondary | ICD-10-CM | POA: Diagnosis not present

## 2023-05-10 DIAGNOSIS — H900 Conductive hearing loss, bilateral: Secondary | ICD-10-CM | POA: Diagnosis not present

## 2023-05-10 DIAGNOSIS — J309 Allergic rhinitis, unspecified: Secondary | ICD-10-CM | POA: Diagnosis not present

## 2023-05-12 DIAGNOSIS — J309 Allergic rhinitis, unspecified: Secondary | ICD-10-CM | POA: Diagnosis not present

## 2023-05-12 DIAGNOSIS — I5020 Unspecified systolic (congestive) heart failure: Secondary | ICD-10-CM | POA: Diagnosis not present

## 2023-05-12 DIAGNOSIS — F02A4 Dementia in other diseases classified elsewhere, mild, with anxiety: Secondary | ICD-10-CM | POA: Diagnosis not present

## 2023-05-12 DIAGNOSIS — N1831 Chronic kidney disease, stage 3a: Secondary | ICD-10-CM | POA: Diagnosis not present

## 2023-05-12 DIAGNOSIS — J449 Chronic obstructive pulmonary disease, unspecified: Secondary | ICD-10-CM | POA: Diagnosis not present

## 2023-05-12 DIAGNOSIS — H900 Conductive hearing loss, bilateral: Secondary | ICD-10-CM | POA: Diagnosis not present

## 2023-05-13 ENCOUNTER — Non-Acute Institutional Stay (SKILLED_NURSING_FACILITY): Admitting: Sports Medicine

## 2023-05-13 ENCOUNTER — Encounter: Payer: Self-pay | Admitting: Sports Medicine

## 2023-05-13 DIAGNOSIS — J449 Chronic obstructive pulmonary disease, unspecified: Secondary | ICD-10-CM

## 2023-05-13 DIAGNOSIS — D649 Anemia, unspecified: Secondary | ICD-10-CM | POA: Diagnosis not present

## 2023-05-13 DIAGNOSIS — F02A4 Dementia in other diseases classified elsewhere, mild, with anxiety: Secondary | ICD-10-CM | POA: Diagnosis not present

## 2023-05-13 DIAGNOSIS — I10 Essential (primary) hypertension: Secondary | ICD-10-CM | POA: Diagnosis not present

## 2023-05-13 DIAGNOSIS — I5020 Unspecified systolic (congestive) heart failure: Secondary | ICD-10-CM | POA: Diagnosis not present

## 2023-05-13 DIAGNOSIS — L03119 Cellulitis of unspecified part of limb: Secondary | ICD-10-CM

## 2023-05-13 DIAGNOSIS — I509 Heart failure, unspecified: Secondary | ICD-10-CM | POA: Diagnosis not present

## 2023-05-13 DIAGNOSIS — R0602 Shortness of breath: Secondary | ICD-10-CM | POA: Diagnosis not present

## 2023-05-13 DIAGNOSIS — H900 Conductive hearing loss, bilateral: Secondary | ICD-10-CM | POA: Diagnosis not present

## 2023-05-13 DIAGNOSIS — D519 Vitamin B12 deficiency anemia, unspecified: Secondary | ICD-10-CM | POA: Diagnosis not present

## 2023-05-13 DIAGNOSIS — N1831 Chronic kidney disease, stage 3a: Secondary | ICD-10-CM | POA: Diagnosis not present

## 2023-05-13 DIAGNOSIS — J309 Allergic rhinitis, unspecified: Secondary | ICD-10-CM | POA: Diagnosis not present

## 2023-05-13 LAB — BASIC METABOLIC PANEL WITH GFR
BUN: 25 — AB (ref 4–21)
CO2: 34 — AB (ref 13–22)
Chloride: 96 — AB (ref 99–108)
Creatinine: 1 (ref 0.5–1.1)
Glucose: 133
Potassium: 3.6 meq/L (ref 3.5–5.1)
Sodium: 138 (ref 137–147)

## 2023-05-13 LAB — COMPREHENSIVE METABOLIC PANEL WITH GFR
Calcium: 9.3 (ref 8.7–10.7)
eGFR: 56

## 2023-05-13 LAB — CBC AND DIFFERENTIAL
HCT: 28 — AB (ref 36–46)
Hemoglobin: 9.3 — AB (ref 12.0–16.0)
Platelets: 211 K/uL (ref 150–400)
WBC: 10.3

## 2023-05-13 LAB — CBC: RBC: 3.13 — AB (ref 3.87–5.11)

## 2023-05-13 NOTE — Progress Notes (Signed)
 Provider:  Dr. Venita Sheffield Location:  [facility] Place of Service:   Skilled care   PCP: Mast, Man X, NP Patient Care Team: Mast, Man X, NP as PCP - General (Internal Medicine) Mast, Man X, NP as Nurse Practitioner (Internal Medicine)  Extended Emergency Contact Information Primary Emergency Contact: Bignell,Jeff Home Phone: [phone] Relation: Son Secondary Emergency Contact: Culbreth Sr.,Christopher Home Phone: [phone] Relation: Relative  Goals of Care: Advanced Directive information    05/06/2023   11:22 AM  Advanced Directives  Does Patient Have a Medical Advance Directive? Yes  Type of Advance Directive Living will;Out of facility DNR (pink MOST or yellow form)  Pre-existing out of facility DNR order (yellow form or pink MOST form) Yellow form placed in chart (order not valid for inpatient use)                             NON HOSPICE RELATED VISIT     History of Present Illness         89 yr old F with h/o COPD, CHF on supplemental o2 , h/o CVA, GERD, Fibromyalgia is evaluated for acute visit for SOB  pt  seen and examined in her room, she is on supplemental o2 3 lit  Pt states she does not feel good  but cannot explain more  Pt c/o exertional SOB , she sleeps with her head elevated  Denies chest pain, dizzy or lightheaded Pt is able to speak in 2 -3 word sentences She did not eat her breakfast this morning, states she does not feel hungry  She was seen last week for cellulitis/ lower extremity wound infection  Currently on doxycycline, lasix     Past Medical History:  Diagnosis Date   Acute blood loss anemia 08/23/2013   04/13/16 TSH 1.20, Na 141, K 4.2, Bun 15, creat 0.79, wbc 5.5, Hgb 11.5, plt 283   Anxiety    Anxiety state 07/02/2007   Qualifier: Diagnosis of  By: Kriste Basque MD, Lonzo Cloud    Asthma    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     Fibromyalgia    GERD 12/19/2006   Qualifier: Diagnosis of  By: Renaldo Fiddler CMA, Marliss Czar  04/13/16 TSH 1.20, Na 141, K 4.2, Bun 15, creat 0.79, wbc 5.5, Hgb 11.5, plt 283    GERD (gastroesophageal reflux disease)    Hammer toe of second toe of left foot 04/08/2016   Left 2nd   Hearing loss    Hemorrhoids    Hypercholesterolemia    Hypertension    Osteoarthritis 12/19/2006   Qualifier: Diagnosis of  By: Renaldo Fiddler CMA, Leigh     Osteoporosis    Past Surgical History:  Procedure Laterality Date   BIOPSY  02/17/2020   Procedure: BIOPSY;  Surgeon: Lynann Bologna, MD;  Location: [facility];  Service: Endoscopy;;   CATARACT EXTRACTION, BILATERAL  2009   COLONOSCOPY  2009, 2006 , 2017   ESOPHAGOGASTRODUODENOSCOPY (EGD) WITH PROPOFOL N/A 02/17/2020   Procedure: ESOPHAGOGASTRODUODENOSCOPY (EGD) WITH PROPOFOL;  Surgeon: Lynann Bologna, MD;  Location: [facility];  Service: Endoscopy;  Laterality: N/A;   IR ANGIOGRAM SELECTIVE EACH ADDITIONAL VESSEL  02/17/2020   IR ANGIOGRAM SELECTIVE EACH ADDITIONAL VESSEL  02/17/2020   IR ANGIOGRAM SELECTIVE EACH ADDITIONAL VESSEL  02/17/2020   IR ANGIOGRAM SELECTIVE EACH ADDITIONAL VESSEL  02/17/2020   IR ANGIOGRAM SELECTIVE EACH ADDITIONAL VESSEL  02/17/2020   IR ANGIOGRAM VISCERAL SELECTIVE  02/17/2020   IR ANGIOGRAM VISCERAL SELECTIVE  02/17/2020   IR US GUIDE VASC ACCESS RIGHT  02/17/2020   stapes surgery     Dr Dorma Russell   TONSILLECTOMY AND ADENOIDECTOMY     as a child    reports that she quit smoking about 13 years ago. Her smoking use included cigarettes. She has never used smokeless tobacco. She reports current alcohol use. She reports that she does not use drugs. Social History    Socioeconomic History   Marital status: Widowed    Spouse name: Not on file   Number of children: Not on file   Years of education: Not on file   Highest education level: Not on file  Occupational History   Occupation: retired   Occupation: works some weekends at [employer]  Tobacco Use   Smoking status: Former    Current packs/day: 0.00    Types: Cigarettes    Quit date: 02/15/2010    Years since quitting: 13.2   Smokeless tobacco: Never  Vaping Use   Vaping status: Never Used  Substance and Sexual Activity   Alcohol use: Yes    Comment: occasional glass of wine   Drug use: No   Sexual activity: Never  Other Topics Concern   Not on file  Social History Narrative   Pt works some weekends at [employer]   Caffeine use - daily coffee in the mornings   Patient gets regular exercise > tries to walk daily for exercise   M[facility] [place] [facility] [place] 03/11/16   Widowed   Former Smoker-stopped 2012   Alcohol occasionally glass of wine   Living Will   Social Drivers of Corporate investment banker Strain: Low Risk  (11/08/2017)   Overall Financial Resource Strain (CARDIA)    Difficulty of Paying Living Expenses: Not hard at all  Food Insecurity: No Food Insecurity (11/08/2017)   Hunger Vital Sign    Worried About Running Out of Food in the Last Year: Never true    Ran Out of Food in the Last Year: Never true  Transportation Needs: No Transportation Needs (11/08/2017)   PRAPARE - Administrator, Civil Service (Medical): No    Lack of Transportation (Non-Medical): No  Physical Activity: Sufficiently Active (11/08/2017)   Exercise Vital Sign    Days of Exercise per Week: 5 days    Minutes of Exercise per Session: 30 min  Stress: No Stress Concern Present (11/08/2017)   [employer] of Occupational Health - Occupational Stress Questionnaire    Feeling of Stress : Not at all  Social Connections: Moderately Isolated (11/08/2017)   Social Connection  and Isolation Panel [NHANES]    Frequency of Communication with Friends and Family: More than three times a week    Frequency of Social Gatherings with Friends and Family: Twice a week    Attends Religious Services: Never    Database administrator or Organizations: No    Attends Banker Meetings: Never    Marital Status: Widowed  Intimate Partner Violence: Not At  Risk (11/08/2017)   Humiliation, Afraid, Rape, and Kick questionnaire    Fear of Current or Ex-Partner: No    Emotionally Abused: No    Physically Abused: No    Sexually Abused: No    Functional Status Survey:    Family History  Problem Relation Age of Onset   Heart disease Father    COPD Father    Hypertension Mother    Arthritis Mother     Health Maintenance  Topic Date Due   DTaP/Tdap/Td (3 - Td or Tdap) 09/21/2032   DEXA SCAN  Completed   HPV VACCINES  Aged Out   Pneumonia Vaccine 57+ Years old  Discontinued   INFLUENZA VACCINE  Discontinued   COVID-19 Vaccine  Discontinued   Zoster Vaccines- Shingrix  Discontinued    Allergies  Allergen Reactions   Penicillins Anaphylaxis   Bisphosphonates Other (See Comments)    [name]   Influenza Vaccines     Unknown   Simvastatin     Unknown    Trazodone And Nefazodone     Felt her throat was closing up/kept her awake   Sulfonamide Derivatives Rash and Other (See Comments)    blisters    Outpatient Encounter Medications as of 05/13/2023  Medication Sig   acetaminophen (TYLENOL) 325 MG tablet Take 650 mg by mouth every 4 (four) hours as needed for fever.  Give 650 mg by mouth at bedtime   albuterol (VENTOLIN HFA) 108 (90 Base) MCG/ACT inhaler Inhale into the lungs every 6 (six) hours as needed for wheezing or shortness of breath.   Albuterol-Budesonide 90-80 MCG/ACT AERO Inhale 2 puffs into the lungs every 6 (six) hours as needed (shortness of breath/ wheezing). (Patient not taking: Reported on 04/14/2023)   Amino Acids-Protein Hydrolys  (FEEDING SUPPLEMENT, PRO-STAT 64,) LIQD Take 30 mLs by mouth daily.   benzonatate (TESSALON) 100 MG capsule Take 1 capsule (100 mg total) by mouth 2 (two) times daily as needed for cough.   budesonide-formoterol (SYMBICORT) 160-4.5 MCG/ACT inhaler Inhale 2 puffs into the lungs daily.   budesonide-formoterol (SYMBICORT) 80-4.5 MCG/ACT inhaler Inhale 1 puff into the lungs daily. (Patient not taking: Reported on 04/14/2023)   calcium carbonate (ANTACID) 750 MG chewable tablet Chew 1 tablet by mouth daily.   cholecalciferol (VITAMIN D) 1000 UNITS tablet Take 1,000 Units by mouth daily.   diclofenac Sodium (VOLTAREN) 1 % GEL Apply 2 g topically every 8 (eight) hours as needed.   docusate sodium (COLACE) 100 MG capsule Take 100 mg by mouth 2 (two) times daily.   doxycycline (VIBRA-TABS) 100 MG tablet Take 100 mg by mouth every 12 (twelve) hours.   escitalopram (LEXAPRO) 20 MG tablet Take 20 mg by mouth at bedtime.   ferrous sulfate 325 (65 FE) MG tablet Take 325 mg by mouth. Once A Day on Mon, Wed, Fri   furosemide (LASIX) 40 MG tablet Take 1 tablet (40 mg total) by mouth daily for 2 days. (Patient not taking: Reported on 05/06/2023)   furosemide (LASIX) 40 MG tablet Take 40 mg by mouth 2 (two) times daily.   HYDROcodone-acetaminophen (NORCO/VICODIN) 5-325 MG tablet Take 1 tablet by mouth every 6 (six) hours as needed for moderate pain (pain score 4-6).   hydrocortisone cream 1 % Apply 1 application topically 2 (two) times daily as needed (wrists). (Patient not taking: Reported on 05/06/2023)   hydrocortisone valerate cream (WESTCORT) 0.2 % Apply 1 application topically 2 (two) times daily as needed (for itching). Cleanse behind ears with  gentle cleanser and dry completely. (Patient not taking: Reported on 05/06/2023)   ipratropium-albuterol (DUONEB) 0.5-2.5 (3) MG/3ML SOLN Take 3 mLs by nebulization in the morning and at bedtime. And 1 vial inhale orally every 24 hours as needed for Wheezing   ketoconazole  (NIZORAL) 2 % cream Apply 1 application  topically 2 (two) times daily as needed for irritation.   Morphine Sulfate (MORPHINE CONCENTRATE) 10 mg / 0.5 ml concentrated solution    nitrofurantoin (MACRODANTIN) 50 MG capsule Take 50 mg by mouth at bedtime.   Olopatadine HCl 0.2 % SOLN Place 1 drop into both eyes daily.   omeprazole (PRILOSEC) 20 MG capsule Take 20 mg by mouth daily.   ondansetron (ZOFRAN) 4 MG tablet Take 4 mg by mouth every 8 (eight) hours as needed for nausea or vomiting.   polyethylene glycol (MIRALAX / GLYCOLAX) 17 g packet Take 17 g by mouth 2 (two) times a week. Sunday and Wednesday   potassium chloride SA (KLOR-CON M) 20 MEQ tablet Take 20 mEq by mouth 2 (two) times daily.   pramoxine (SARNA SENSITIVE) 1 % LOTN Apply to cheeks topically one time a day for rosy burning cheeks   predniSONE (DELTASONE) 5 MG tablet Take 5 mg by mouth daily with breakfast.   pregabalin (LYRICA) 75 MG capsule Take 1 capsule (75 mg total) by mouth at bedtime.   Propylene Glycol (SYSTANE COMPLETE) 0.6 % SOLN Instill 1 drop in both eyes four times a day for DRY EYE   vitamin B-12 (CYANOCOBALAMIN) 1000 MCG tablet Take 1,000 mcg by mouth daily.   zolpidem (AMBIEN) 5 MG tablet Take 1 tablet (5 mg total) by mouth at bedtime.   No facility-administered encounter medications on file as of 05/13/2023.    Review of Systems  Constitutional:  Negative for fever.  HENT:  Negative for sinus pressure and sore throat.   Respiratory:  Positive for shortness of breath. Negative for cough and wheezing.   Cardiovascular:  Positive for leg swelling. Negative for chest pain and palpitations.  Gastrointestinal:  Negative for abdominal distention, abdominal pain, blood in stool, constipation, diarrhea, nausea and vomiting.  Genitourinary:  Negative for dysuria.  Neurological:  Negative for dizziness.   Negative unless indicated in HPI.  There were no vitals filed for this visit. There is no height or weight on  file to calculate BMI. BP Readings from Last 3 Encounters:  05/06/23 (!) 137/59  04/25/23 (!) 146/68  04/14/23 132/60   Wt Readings from Last 3 Encounters:  05/06/23 156 lb 9.6 oz (71 kg)  04/22/23 156 lb 9.6 oz (71 kg)  04/14/23 158 lb (71.7 kg)   Physical Exam Constitutional:      Appearance: Normal appearance.  HENT:     Head: Normocephalic and atraumatic.  Cardiovascular:     Rate and Rhythm: Normal rate and regular rhythm.  Pulmonary:     Effort: Pulmonary effort is normal.     Comments: Coarse breath sounds bilaterally Basal crackles + Abdominal:     General: Bowel sounds are normal. There is no distension.     Tenderness: There is no abdominal tenderness. There is no guarding or rebound.     Comments:    Musculoskeletal:        General: Swelling and tenderness present.     Comments: Bil lower extremity swelling  Rt leg wound 2 cm superficial , looks better from last week Left leg 1 cm open shallow wound with yellowish slough    Neurological:  Mental Status: She is alert. Mental status is at baseline.     Motor: No weakness.     Labs reviewed: Basic Metabolic Panel: Recent Labs    07/31/22 1448 08/13/22 0000 08/24/22 0000 11/09/22 0000 01/20/23 0000  NA 137   < > 141 139 139  K 3.6   < > 4.1 3.7 3.6  CL 100   < > 103 98* 98*  CO2 27   < > 31* 32* 33*  GLUCOSE 112*  --   --   --   --   BUN 16   < > 18 20 19   CREATININE 1.25*   < > 1.1 1.1 1.1  CALCIUM 9.1   < > 8.8 9.0 9.1   < > = values in this interval not displayed.   Liver Function Tests: Recent Labs    07/31/22 1448 01/20/23 0000  AST 20 12*  ALT 19 12  ALKPHOS 78 117  BILITOT 1.2  --   PROT 6.1*  --   ALBUMIN 3.6 3.7   No results for input(s): "LIPASE", "AMYLASE" in the last 8760 hours. No results for input(s): "AMMONIA" in the last 8760 hours. CBC: Recent Labs    07/27/22 0000 07/31/22 1448 01/20/23 0000  WBC 8.1 7.0 7.5  NEUTROABS  --   --  5,588.00  HGB 12.3 13.3 10.5*   HCT 37 41.6 33*  MCV  --  95.2  --   PLT 194 179 218   Cardiac Enzymes: No results for input(s): "CKTOTAL", "CKMB", "CKMBINDEX", "TROPONINI" in the last 8760 hours. BNP: Invalid input(s): "POCBNP" Lab Results  Component Value Date   HGBA1C 6.6 08/13/2022   Lab Results  Component Value Date   TSH 1.03 08/13/2022   Lab Results  Component Value Date   VITAMINB12 1,966 01/20/2023   No results found for: "FOLATE" Lab Results  Component Value Date   IRON 63 01/20/2023   TIBC 199 01/20/2023   FERRITIN 203 01/20/2023    Imaging and Procedures obtained prior to SNF admission: DG Chest Port 1 View Result Date: 07/31/2022 CLINICAL DATA:  Shortness of breath. EXAM: PORTABLE CHEST 1 VIEW COMPARISON:  02/13/2016 FINDINGS: The heart size and mediastinal contours are within normal limits. Aortic atherosclerotic calcification incidentally noted. Both lungs are clear. The visualized skeletal structures are unremarkable. IMPRESSION: No active disease. Electronically Signed   By: Danae Orleans M.D.   On: 07/31/2022 15:42    Assessment and Plan         1. Acute on chronic congestive heart failure, unspecified heart failure type (HCC) (Primary) 2. Chronic obstructive pulmonary disease, unspecified COPD type (HCC)  Pt reports that she is SOB  Able to speak in 2-3 word sentences Alert , Report spoor appetite  Lungs - coarse breath sounds bil Lower extremity swelling  Will get cbc, bmp, chest x ray  Cont with lasix 40 mg bid Cont with albuterol- budesonide q6 prn for wheeing Will increase prednisone to 40 mg daily from 5 mg  x 5 days   Cellulitis  Wounds with yellowish slough  Afebrile  Cont with doxycycline    30 min Total time spent for obtaining history,  performing a medically appropriate examination and evaluation, reviewing the tests,  documenting clinical information in the electronic or other health record,  care coordination (not separately reported)

## 2023-05-16 DIAGNOSIS — J309 Allergic rhinitis, unspecified: Secondary | ICD-10-CM | POA: Diagnosis not present

## 2023-05-16 DIAGNOSIS — J449 Chronic obstructive pulmonary disease, unspecified: Secondary | ICD-10-CM | POA: Diagnosis not present

## 2023-05-16 DIAGNOSIS — I5020 Unspecified systolic (congestive) heart failure: Secondary | ICD-10-CM | POA: Diagnosis not present

## 2023-05-16 DIAGNOSIS — N1831 Chronic kidney disease, stage 3a: Secondary | ICD-10-CM | POA: Diagnosis not present

## 2023-05-16 DIAGNOSIS — H900 Conductive hearing loss, bilateral: Secondary | ICD-10-CM | POA: Diagnosis not present

## 2023-05-16 DIAGNOSIS — F02A4 Dementia in other diseases classified elsewhere, mild, with anxiety: Secondary | ICD-10-CM | POA: Diagnosis not present

## 2023-05-17 ENCOUNTER — Other Ambulatory Visit: Payer: Self-pay | Admitting: Adult Health

## 2023-05-17 DIAGNOSIS — D649 Anemia, unspecified: Secondary | ICD-10-CM | POA: Diagnosis not present

## 2023-05-17 DIAGNOSIS — J309 Allergic rhinitis, unspecified: Secondary | ICD-10-CM | POA: Diagnosis not present

## 2023-05-17 DIAGNOSIS — H900 Conductive hearing loss, bilateral: Secondary | ICD-10-CM | POA: Diagnosis not present

## 2023-05-17 DIAGNOSIS — K219 Gastro-esophageal reflux disease without esophagitis: Secondary | ICD-10-CM | POA: Diagnosis not present

## 2023-05-17 DIAGNOSIS — G4700 Insomnia, unspecified: Secondary | ICD-10-CM | POA: Diagnosis not present

## 2023-05-17 DIAGNOSIS — I5020 Unspecified systolic (congestive) heart failure: Secondary | ICD-10-CM | POA: Diagnosis not present

## 2023-05-17 DIAGNOSIS — R12 Heartburn: Secondary | ICD-10-CM | POA: Diagnosis not present

## 2023-05-17 DIAGNOSIS — J42 Unspecified chronic bronchitis: Secondary | ICD-10-CM | POA: Diagnosis not present

## 2023-05-17 DIAGNOSIS — N1831 Chronic kidney disease, stage 3a: Secondary | ICD-10-CM | POA: Diagnosis not present

## 2023-05-17 DIAGNOSIS — F02A4 Dementia in other diseases classified elsewhere, mild, with anxiety: Secondary | ICD-10-CM | POA: Diagnosis not present

## 2023-05-17 DIAGNOSIS — F32A Depression, unspecified: Secondary | ICD-10-CM | POA: Diagnosis not present

## 2023-05-17 DIAGNOSIS — J449 Chronic obstructive pulmonary disease, unspecified: Secondary | ICD-10-CM | POA: Diagnosis not present

## 2023-05-17 MED ORDER — HYDROCODONE-ACETAMINOPHEN 5-325 MG PO TABS: 1.0000 | ORAL_TABLET | Freq: Four times a day (QID) | ORAL | 0 refills | Status: DC | PRN

## 2023-05-19 DIAGNOSIS — J449 Chronic obstructive pulmonary disease, unspecified: Secondary | ICD-10-CM | POA: Diagnosis not present

## 2023-05-19 DIAGNOSIS — F02A4 Dementia in other diseases classified elsewhere, mild, with anxiety: Secondary | ICD-10-CM | POA: Diagnosis not present

## 2023-05-19 DIAGNOSIS — N1831 Chronic kidney disease, stage 3a: Secondary | ICD-10-CM | POA: Diagnosis not present

## 2023-05-19 DIAGNOSIS — J309 Allergic rhinitis, unspecified: Secondary | ICD-10-CM | POA: Diagnosis not present

## 2023-05-19 DIAGNOSIS — H900 Conductive hearing loss, bilateral: Secondary | ICD-10-CM | POA: Diagnosis not present

## 2023-05-19 DIAGNOSIS — I5020 Unspecified systolic (congestive) heart failure: Secondary | ICD-10-CM | POA: Diagnosis not present

## 2023-05-20 DIAGNOSIS — J309 Allergic rhinitis, unspecified: Secondary | ICD-10-CM | POA: Diagnosis not present

## 2023-05-20 DIAGNOSIS — H900 Conductive hearing loss, bilateral: Secondary | ICD-10-CM | POA: Diagnosis not present

## 2023-05-20 DIAGNOSIS — N1831 Chronic kidney disease, stage 3a: Secondary | ICD-10-CM | POA: Diagnosis not present

## 2023-05-20 DIAGNOSIS — F02A4 Dementia in other diseases classified elsewhere, mild, with anxiety: Secondary | ICD-10-CM | POA: Diagnosis not present

## 2023-05-20 DIAGNOSIS — J449 Chronic obstructive pulmonary disease, unspecified: Secondary | ICD-10-CM | POA: Diagnosis not present

## 2023-05-20 DIAGNOSIS — I5020 Unspecified systolic (congestive) heart failure: Secondary | ICD-10-CM | POA: Diagnosis not present

## 2023-05-24 DIAGNOSIS — I5020 Unspecified systolic (congestive) heart failure: Secondary | ICD-10-CM | POA: Diagnosis not present

## 2023-05-24 DIAGNOSIS — J309 Allergic rhinitis, unspecified: Secondary | ICD-10-CM | POA: Diagnosis not present

## 2023-05-24 DIAGNOSIS — H900 Conductive hearing loss, bilateral: Secondary | ICD-10-CM | POA: Diagnosis not present

## 2023-05-24 DIAGNOSIS — J449 Chronic obstructive pulmonary disease, unspecified: Secondary | ICD-10-CM | POA: Diagnosis not present

## 2023-05-24 DIAGNOSIS — F02A4 Dementia in other diseases classified elsewhere, mild, with anxiety: Secondary | ICD-10-CM | POA: Diagnosis not present

## 2023-05-24 DIAGNOSIS — N1831 Chronic kidney disease, stage 3a: Secondary | ICD-10-CM | POA: Diagnosis not present

## 2023-05-25 DIAGNOSIS — J449 Chronic obstructive pulmonary disease, unspecified: Secondary | ICD-10-CM | POA: Diagnosis not present

## 2023-05-25 DIAGNOSIS — I5020 Unspecified systolic (congestive) heart failure: Secondary | ICD-10-CM | POA: Diagnosis not present

## 2023-05-25 DIAGNOSIS — N1831 Chronic kidney disease, stage 3a: Secondary | ICD-10-CM | POA: Diagnosis not present

## 2023-05-25 DIAGNOSIS — J309 Allergic rhinitis, unspecified: Secondary | ICD-10-CM | POA: Diagnosis not present

## 2023-05-25 DIAGNOSIS — F02A4 Dementia in other diseases classified elsewhere, mild, with anxiety: Secondary | ICD-10-CM | POA: Diagnosis not present

## 2023-05-25 DIAGNOSIS — H900 Conductive hearing loss, bilateral: Secondary | ICD-10-CM | POA: Diagnosis not present

## 2023-05-26 ENCOUNTER — Non-Acute Institutional Stay (SKILLED_NURSING_FACILITY): Payer: Self-pay | Admitting: Sports Medicine

## 2023-05-26 DIAGNOSIS — T148XXA Other injury of unspecified body region, initial encounter: Secondary | ICD-10-CM

## 2023-05-26 DIAGNOSIS — M159 Polyosteoarthritis, unspecified: Secondary | ICD-10-CM

## 2023-05-26 DIAGNOSIS — J4489 Other specified chronic obstructive pulmonary disease: Secondary | ICD-10-CM

## 2023-05-26 DIAGNOSIS — G629 Polyneuropathy, unspecified: Secondary | ICD-10-CM

## 2023-05-26 DIAGNOSIS — F411 Generalized anxiety disorder: Secondary | ICD-10-CM

## 2023-05-26 DIAGNOSIS — K219 Gastro-esophageal reflux disease without esophagitis: Secondary | ICD-10-CM

## 2023-05-26 DIAGNOSIS — I509 Heart failure, unspecified: Secondary | ICD-10-CM | POA: Diagnosis not present

## 2023-05-26 NOTE — Progress Notes (Signed)
 Provider:  Dr. Tye Gall Location:  [facility] Place of Service:   Skilled care   PCP: Mast, Man X, NP Patient Care Team: Mast, Man X, NP as PCP - General (Internal Medicine) Mast, Man X, NP as Nurse Practitioner (Internal Medicine)  Extended Emergency Contact Information Primary Emergency Contact: Escareno,Jeff Home Phone: [phone] Relation: Son Secondary Emergency Contact: Culbreth Sr.,Christopher Home Phone: [phone] Relation: Relative  Goals of Care: Advanced Directive information    05/06/2023   11:22 AM  Advanced Directives  Does Patient Have a Medical Advance Directive? Yes  Type of Advance Directive Living will;Out of facility DNR (pink MOST or yellow form)  Pre-existing out of facility DNR order (yellow form or pink MOST form) Yellow form placed in chart (order not valid for inpatient use)      No chief complaint on file.      History of Present Illness  89 yr old F is evaluated for chronic disease management.   Pt seen and examined in her room  Able to speak in full sentences Does not appear to be in distress Denies chest pain  States her breathing is fine Denies abdominal pain, nausea, vomiting, dysuria, hematuria, bloody or dark stools. She needs help with transferring and able to walk short distances with the help of walker in her room    BASIC METABOLIC PANEL GLUCOSE 133 mg/dL 10-27 H Final  Fasting reference interval For someone without known diabetes, a glucose value >125 mg/dL indicates that they may have diabetes and this should be confirmed with a follow-up test. UREA NITROGEN (BUN) 25 mg/dL 2-53 Final CREATININE 6.64 mg/dL 4.03-4.74 H Final EGFR 56 mL/min/1.73 m2 > OR = 60 L Final BUN/CREATININE RATIO 26 (calc) 6-22 H Final SODIUM 138 mmol/L 135-146 Final POTASSIUM 3.6 mmol/L 3.5-5.3 Final CHLORIDE 96 mmol/L 98-110 L Final CARBON DIOXIDE 34 mmol/L 20-32 H Final CALCIUM  9.3 mg/dL 2.5-95.6 Final CBC (H/H,  RBC, INDICES, WBC, PLT) WHITE BLOOD CELL COUNT 10.3 Thousand/u L 3.8-10.8 Final RED BLOOD CELL COUNT 3.13 Million/uL 3.80-5.10 L Final HEMOGLOBIN 9.3 g/dL 38.7-56.4 L Final HEMATOCRIT 28.3 % 35.0-45.0 L Final MCV 90.4 fL 80.0-100.0 Final MCH 29.7 pg 27.0-33.0 Final MCHC 32.9 g/dL 33.2-95.1 Final  Past Medical History:  Diagnosis Date   Acute blood loss anemia 08/23/2013   04/13/16 TSH 1.20, Na 141, K 4.2, Bun 15, creat 0.79, wbc 5.5, Hgb 11.5, plt 283   Anxiety    Anxiety state 07/02/2007   Qualifier: Diagnosis of  By: Elinor Guardian MD, Raenelle Bumpers    Asthma    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     Fibromyalgia    GERD 12/19/2006   Qualifier: Diagnosis of  By: Avis Boehringer CMA, Cleave Curling  04/13/16 TSH 1.20, Na 141, K 4.2, Bun 15, creat 0.79, wbc 5.5, Hgb 11.5, plt 283    GERD (gastroesophageal reflux disease)    Hammer toe of second toe of left foot 04/08/2016   Left 2nd   Hearing loss    Hemorrhoids    Hypercholesterolemia    Hypertension  Osteoarthritis 12/19/2006   Qualifier: Diagnosis of  By: Avis Boehringer CMA, Leigh     Osteoporosis    Past Surgical History:  Procedure Laterality Date   BIOPSY  02/17/2020   Procedure: BIOPSY;  Surgeon: Lajuan Pila, MD;  Location: [facility];  Service: Endoscopy;;   CATARACT EXTRACTION, BILATERAL  2009   COLONOSCOPY  2009, 2006 , 2017   ESOPHAGOGASTRODUODENOSCOPY (EGD) WITH PROPOFOL  N/A 02/17/2020   Procedure: ESOPHAGOGASTRODUODENOSCOPY (EGD) WITH PROPOFOL ;  Surgeon: Lajuan Pila, MD;  Location: [facility];  Service: Endoscopy;  Laterality: N/A;   IR ANGIOGRAM SELECTIVE  EACH ADDITIONAL VESSEL  02/17/2020   IR ANGIOGRAM SELECTIVE EACH ADDITIONAL VESSEL  02/17/2020   IR ANGIOGRAM SELECTIVE EACH ADDITIONAL VESSEL  02/17/2020   IR ANGIOGRAM SELECTIVE EACH ADDITIONAL VESSEL  02/17/2020   IR ANGIOGRAM SELECTIVE EACH ADDITIONAL VESSEL  02/17/2020   IR ANGIOGRAM VISCERAL SELECTIVE  02/17/2020   IR ANGIOGRAM VISCERAL SELECTIVE  02/17/2020   IR US  GUIDE VASC ACCESS RIGHT  02/17/2020   stapes surgery     Dr Jodelle Mungo   TONSILLECTOMY AND ADENOIDECTOMY     as a child    reports that she quit smoking about 13 years ago. Her smoking use included cigarettes. She has never used smokeless tobacco. She reports current alcohol use. She reports that she does not use drugs. Social History   Socioeconomic History   Marital status: Widowed    Spouse name: Not on file   Number of children: Not on file   Years of education: Not on file   Highest education level: Not on file  Occupational History   Occupation: retired   Occupation: works some weekends at [employer]  Tobacco Use   Smoking status: Former    Current packs/day: 0.00    Types: Cigarettes    Quit date: 02/15/2010    Years since quitting: 13.2   Smokeless tobacco: Never  Vaping Use   Vaping status: Never Used  Substance and Sexual Activity   Alcohol use: Yes    Comment: occasional glass of wine   Drug use: No   Sexual activity: Never  Other Topics Concern   Not on file  Social History Narrative   Pt works some weekends at [employer]   Caffeine use - daily coffee in the mornings   Patient gets regular exercise > tries to walk daily for exercise   Moved [place] [facility] [place] 03/11/16   Widowed   Former Smoker-stopped 2012   Alcohol occasionally glass of wine   Living Will   Social Drivers of Corporate investment banker Strain: Low Risk  (11/08/2017)   Overall Financial Resource Strain (CARDIA)    Difficulty of Paying Living Expenses: Not hard at all  Food Insecurity: No Food Insecurity (11/08/2017)    Hunger Vital Sign    Worried About Running Out of Food in the Last Year: Never true    Ran Out of Food in the Last Year: Never true  Transportation Needs: No Transportation Needs (11/08/2017)   PRAPARE - Administrator, Civil Service (Medical): No    Lack of Transportation (Non-Medical): No  Physical Activity: Sufficiently Active (11/08/2017)   Exercise Vital Sign    Days of Exercise per Week: 5 days    Minutes of Exercise per Session: 30 min  Stress: No Stress Concern Present (11/08/2017)   [employer] of Occupational Health - Occupational Stress Questionnaire    Feeling of Stress : Not at all  Social Connections:  Moderately Isolated (11/08/2017)   Social Connection and Isolation Panel [NHANES]    Frequency of Communication with Friends and Family: More than three times a week    Frequency of Social Gatherings with Friends and Family: Twice a week    Attends Religious Services: Never    Database administrator or Organizations: No    Attends Banker Meetings: Never    Marital Status: Widowed  Intimate Partner Violence: Not At Risk (11/08/2017)   Humiliation, Afraid, Rape, and Kick questionnaire    Fear of Current or Ex-Partner: No    Emotionally Abused: No    Physically Abused: No    Sexually Abused: No    Functional Status Survey:    Family History  Problem Relation Age of Onset   Heart disease Father    COPD Father    Hypertension Mother    Arthritis Mother     Health Maintenance  Topic Date Due   DTaP/Tdap/Td (3 - Td or Tdap) 09/21/2032   DEXA SCAN  Completed   HPV VACCINES  Aged Out   Meningococcal B Vaccine  Aged Out   Pneumonia Vaccine 44+ Years old  Discontinued   INFLUENZA VACCINE  Discontinued   COVID-19 Vaccine  Discontinued   Zoster Vaccines- Shingrix  Discontinued    Allergies  Allergen Reactions   Penicillins Anaphylaxis   Bisphosphonates Other (See Comments)    [name]   Influenza Vaccines     Unknown    Simvastatin     Unknown    Trazodone  And Nefazodone     Felt her throat was closing up/kept her awake   Sulfonamide Derivatives Rash and Other (See Comments)    blisters    Outpatient Encounter Medications as of 05/26/2023  Medication Sig   acetaminophen  (TYLENOL ) 325 MG tablet Take 650 mg by mouth every 4 (four) hours as needed for fever.  Give 650 mg by mouth at bedtime   albuterol  (VENTOLIN  HFA) 108 (90 Base) MCG/ACT inhaler Inhale into the lungs every 6 (six) hours as needed for wheezing or shortness of breath.   Albuterol -Budesonide 90-80 MCG/ACT AERO Inhale 2 puffs into the lungs every 6 (six) hours as needed (shortness of breath/ wheezing). (Patient not taking: Reported on 04/14/2023)   Amino Acids-Protein Hydrolys (FEEDING SUPPLEMENT, PRO-STAT 64,) LIQD Take 30 mLs by mouth daily.   benzonatate  (TESSALON ) 100 MG capsule Take 1 capsule (100 mg total) by mouth 2 (two) times daily as needed for cough.   budesonide-formoterol (SYMBICORT) 160-4.5 MCG/ACT inhaler Inhale 2 puffs into the lungs daily.   budesonide-formoterol (SYMBICORT) 80-4.5 MCG/ACT inhaler Inhale 1 puff into the lungs daily. (Patient not taking: Reported on 04/14/2023)   calcium  carbonate (ANTACID) 750 MG chewable tablet Chew 1 tablet by mouth daily.   cholecalciferol (VITAMIN D ) 1000 UNITS tablet Take 1,000 Units by mouth daily.   diclofenac Sodium (VOLTAREN) 1 % GEL Apply 2 g topically every 8 (eight) hours as needed.   docusate sodium  (COLACE) 100 MG capsule Take 100 mg by mouth 2 (two) times daily.   doxycycline (VIBRA-TABS) 100 MG tablet Take 100 mg by mouth every 12 (twelve) hours.   escitalopram (LEXAPRO) 20 MG tablet Take 20 mg by mouth at bedtime.   ferrous sulfate 325 (65 FE) MG tablet Take 325 mg by mouth. Once A Day on Mon, Wed, Fri   furosemide  (LASIX ) 40 MG tablet Take 1 tablet (40 mg total) by mouth daily for 2 days. (Patient not taking: Reported on 05/06/2023)  furosemide  (LASIX ) 40 MG tablet Take 40 mg by  mouth 2 (two) times daily.   HYDROcodone -acetaminophen  (NORCO/VICODIN) 5-325 MG tablet Take 1 tablet by mouth every 6 (six) hours as needed for moderate pain (pain score 4-6).   hydrocortisone  cream 1 % Apply 1 application topically 2 (two) times daily as needed (wrists). (Patient not taking: Reported on 05/06/2023)   hydrocortisone  valerate cream (WESTCORT ) 0.2 % Apply 1 application topically 2 (two) times daily as needed (for itching). Cleanse behind ears with gentle cleanser and dry completely. (Patient not taking: Reported on 05/06/2023)   ipratropium-albuterol  (DUONEB) 0.5-2.5 (3) MG/3ML SOLN Take 3 mLs by nebulization in the morning and at bedtime. And 1 vial inhale orally every 24 hours as needed for Wheezing   ketoconazole (NIZORAL) 2 % cream Apply 1 application  topically 2 (two) times daily as needed for irritation.   Morphine Sulfate (MORPHINE CONCENTRATE) 10 mg / 0.5 ml concentrated solution    nitrofurantoin (MACRODANTIN) 50 MG capsule Take 50 mg by mouth at bedtime.   Olopatadine HCl 0.2 % SOLN Place 1 drop into both eyes daily.   omeprazole  (PRILOSEC) 20 MG capsule Take 20 mg by mouth daily.   ondansetron  (ZOFRAN ) 4 MG tablet Take 4 mg by mouth every 8 (eight) hours as needed for nausea or vomiting.   polyethylene glycol (MIRALAX  / GLYCOLAX ) 17 g packet Take 17 g by mouth 2 (two) times a week. Sunday and Wednesday   potassium chloride  SA (KLOR-CON  M) 20 MEQ tablet Take 20 mEq by mouth 2 (two) times daily.   pramoxine (SARNA SENSITIVE) 1 % LOTN Apply to cheeks topically one time a day for rosy burning cheeks   predniSONE  (DELTASONE ) 5 MG tablet Take 5 mg by mouth daily with breakfast.   pregabalin  (LYRICA ) 75 MG capsule Take 1 capsule (75 mg total) by mouth at bedtime.   Propylene Glycol (SYSTANE COMPLETE) 0.6 % SOLN Instill 1 drop in both eyes four times a day for DRY EYE   vitamin B-12 (CYANOCOBALAMIN ) 1000 MCG tablet Take 1,000 mcg by mouth daily.   zolpidem  (AMBIEN ) 5 MG tablet Take  1 tablet (5 mg total) by mouth at bedtime.   No facility-administered encounter medications on file as of 05/26/2023.    Review of Systems Negative unless indicated in HPI.  There were no vitals filed for this visit. There is no height or weight on file to calculate BMI. BP Readings from Last 3 Encounters:  05/13/23 116/62  05/06/23 (!) 137/59  04/25/23 (!) 146/68   Wt Readings from Last 3 Encounters:  05/06/23 156 lb 9.6 oz (71 kg)  04/22/23 156 lb 9.6 oz (71 kg)  04/14/23 158 lb (71.7 kg)   Physical Exam Constitutional:      Appearance: Normal appearance.  HENT:     Head: Normocephalic and atraumatic.  Cardiovascular:     Rate and Rhythm: Normal rate and regular rhythm.  Pulmonary:     Effort: Pulmonary effort is normal. No respiratory distress.     Breath sounds: Rales (lower lung bases) present. No wheezing.  Abdominal:     General: Bowel sounds are normal. There is no distension.     Tenderness: There is no abdominal tenderness. There is no guarding or rebound.     Comments:    Musculoskeletal:        General: Swelling present. No tenderness.     Comments: 2+ pitting odema Open wounds on both her legs  Rt leg shallow 2cm open wound with  yellowish slough  Minimal redness surrounding the wound  No tenderness  Left Leg - small shallow open wound about 1.5 cm with yellowish slough   Neurological:     Mental Status: She is alert. Mental status is at baseline.     Labs reviewed: Basic Metabolic Panel: Recent Labs    07/31/22 1448 08/13/22 0000 08/24/22 0000 11/09/22 0000 01/20/23 0000  NA 137   < > 141 139 139  K 3.6   < > 4.1 3.7 3.6  CL 100   < > 103 98* 98*  CO2 27   < > 31* 32* 33*  GLUCOSE 112*  --   --   --   --   BUN 16   < > 18 20 19   CREATININE 1.25*   < > 1.1 1.1 1.1  CALCIUM  9.1   < > 8.8 9.0 9.1   < > = values in this interval not displayed.   Liver Function Tests: Recent Labs    07/31/22 1448 01/20/23 0000  AST 20 12*  ALT 19 12   ALKPHOS 78 117  BILITOT 1.2  --   PROT 6.1*  --   ALBUMIN 3.6 3.7   No results for input(s): "LIPASE", "AMYLASE" in the last 8760 hours. No results for input(s): "AMMONIA" in the last 8760 hours. CBC: Recent Labs    07/27/22 0000 07/31/22 1448 01/20/23 0000  WBC 8.1 7.0 7.5  NEUTROABS  --   --  5,588.00  HGB 12.3 13.3 10.5*  HCT 37 41.6 33*  MCV  --  95.2  --   PLT 194 179 218   Cardiac Enzymes: No results for input(s): "CKTOTAL", "CKMB", "CKMBINDEX", "TROPONINI" in the last 8760 hours. BNP: Invalid input(s): "POCBNP" Lab Results  Component Value Date   HGBA1C 6.6 08/13/2022   Lab Results  Component Value Date   TSH 1.03 08/13/2022   Lab Results  Component Value Date   VITAMINB12 1,966 01/20/2023   No results found for: "FOLATE" Lab Results  Component Value Date   IRON 63 01/20/2023   TIBC 199 01/20/2023   FERRITIN 203 01/20/2023    Imaging and Procedures obtained prior to SNF admission: DG Chest Port 1 View Result Date: 07/31/2022 CLINICAL DATA:  Shortness of breath. EXAM: PORTABLE CHEST 1 VIEW COMPARISON:  02/13/2016 FINDINGS: The heart size and mediastinal contours are within normal limits. Aortic atherosclerotic calcification incidentally noted. Both lungs are clear. The visualized skeletal structures are unremarkable. IMPRESSION: No active disease. Electronically Signed   By: Marlyce Sine M.D.   On: 07/31/2022 15:42    Assessment and Plan Assessment & Plan    1. Congestive heart failure, unspecified HF chronicity, unspecified heart failure type (HCC) (Primary) Basal crackles Lower extremity swelling + chronic Does not appear to be in distress    2. COPD (chronic obstructive pulmonary disease) with chronic bronchitis (HCC) No wheezing  Cont with symbicort  3. Gastroesophageal reflux disease, unspecified whether esophagitis present Cont with omeprazole   4. Anxiety state Stable  Cont with lexapro  5. Generalized OA Cont with morphine,  norco  6. Neuropathy Cont with lyrica   7. Open wound Cont with daily dressing changes and med honey    30 min Total time spent for obtaining history,  performing a medically appropriate examination and evaluation,  documenting clinical information in the electronic or other health record, independently interpreting results ,care coordination (not separately reported)

## 2023-05-27 DIAGNOSIS — J309 Allergic rhinitis, unspecified: Secondary | ICD-10-CM | POA: Diagnosis not present

## 2023-05-27 DIAGNOSIS — I5020 Unspecified systolic (congestive) heart failure: Secondary | ICD-10-CM | POA: Diagnosis not present

## 2023-05-27 DIAGNOSIS — J449 Chronic obstructive pulmonary disease, unspecified: Secondary | ICD-10-CM | POA: Diagnosis not present

## 2023-05-27 DIAGNOSIS — H900 Conductive hearing loss, bilateral: Secondary | ICD-10-CM | POA: Diagnosis not present

## 2023-05-27 DIAGNOSIS — F02A4 Dementia in other diseases classified elsewhere, mild, with anxiety: Secondary | ICD-10-CM | POA: Diagnosis not present

## 2023-05-27 DIAGNOSIS — N1831 Chronic kidney disease, stage 3a: Secondary | ICD-10-CM | POA: Diagnosis not present

## 2023-06-01 DIAGNOSIS — N1831 Chronic kidney disease, stage 3a: Secondary | ICD-10-CM | POA: Diagnosis not present

## 2023-06-01 DIAGNOSIS — J449 Chronic obstructive pulmonary disease, unspecified: Secondary | ICD-10-CM | POA: Diagnosis not present

## 2023-06-01 DIAGNOSIS — F02A4 Dementia in other diseases classified elsewhere, mild, with anxiety: Secondary | ICD-10-CM | POA: Diagnosis not present

## 2023-06-01 DIAGNOSIS — J309 Allergic rhinitis, unspecified: Secondary | ICD-10-CM | POA: Diagnosis not present

## 2023-06-01 DIAGNOSIS — H900 Conductive hearing loss, bilateral: Secondary | ICD-10-CM | POA: Diagnosis not present

## 2023-06-01 DIAGNOSIS — I5020 Unspecified systolic (congestive) heart failure: Secondary | ICD-10-CM | POA: Diagnosis not present

## 2023-06-02 DIAGNOSIS — H900 Conductive hearing loss, bilateral: Secondary | ICD-10-CM | POA: Diagnosis not present

## 2023-06-02 DIAGNOSIS — I5020 Unspecified systolic (congestive) heart failure: Secondary | ICD-10-CM | POA: Diagnosis not present

## 2023-06-02 DIAGNOSIS — F02A4 Dementia in other diseases classified elsewhere, mild, with anxiety: Secondary | ICD-10-CM | POA: Diagnosis not present

## 2023-06-02 DIAGNOSIS — J449 Chronic obstructive pulmonary disease, unspecified: Secondary | ICD-10-CM | POA: Diagnosis not present

## 2023-06-02 DIAGNOSIS — J309 Allergic rhinitis, unspecified: Secondary | ICD-10-CM | POA: Diagnosis not present

## 2023-06-02 DIAGNOSIS — N1831 Chronic kidney disease, stage 3a: Secondary | ICD-10-CM | POA: Diagnosis not present

## 2023-06-03 DIAGNOSIS — J449 Chronic obstructive pulmonary disease, unspecified: Secondary | ICD-10-CM | POA: Diagnosis not present

## 2023-06-03 DIAGNOSIS — F02A4 Dementia in other diseases classified elsewhere, mild, with anxiety: Secondary | ICD-10-CM | POA: Diagnosis not present

## 2023-06-03 DIAGNOSIS — N1831 Chronic kidney disease, stage 3a: Secondary | ICD-10-CM | POA: Diagnosis not present

## 2023-06-03 DIAGNOSIS — H900 Conductive hearing loss, bilateral: Secondary | ICD-10-CM | POA: Diagnosis not present

## 2023-06-03 DIAGNOSIS — I5020 Unspecified systolic (congestive) heart failure: Secondary | ICD-10-CM | POA: Diagnosis not present

## 2023-06-03 DIAGNOSIS — J309 Allergic rhinitis, unspecified: Secondary | ICD-10-CM | POA: Diagnosis not present

## 2023-06-06 ENCOUNTER — Encounter: Payer: Self-pay | Admitting: Sports Medicine

## 2023-06-07 DIAGNOSIS — F02A4 Dementia in other diseases classified elsewhere, mild, with anxiety: Secondary | ICD-10-CM | POA: Diagnosis not present

## 2023-06-07 DIAGNOSIS — H900 Conductive hearing loss, bilateral: Secondary | ICD-10-CM | POA: Diagnosis not present

## 2023-06-07 DIAGNOSIS — J449 Chronic obstructive pulmonary disease, unspecified: Secondary | ICD-10-CM | POA: Diagnosis not present

## 2023-06-07 DIAGNOSIS — J309 Allergic rhinitis, unspecified: Secondary | ICD-10-CM | POA: Diagnosis not present

## 2023-06-07 DIAGNOSIS — N1831 Chronic kidney disease, stage 3a: Secondary | ICD-10-CM | POA: Diagnosis not present

## 2023-06-07 DIAGNOSIS — I5020 Unspecified systolic (congestive) heart failure: Secondary | ICD-10-CM | POA: Diagnosis not present

## 2023-06-08 DIAGNOSIS — F02A4 Dementia in other diseases classified elsewhere, mild, with anxiety: Secondary | ICD-10-CM | POA: Diagnosis not present

## 2023-06-08 DIAGNOSIS — N1831 Chronic kidney disease, stage 3a: Secondary | ICD-10-CM | POA: Diagnosis not present

## 2023-06-08 DIAGNOSIS — J309 Allergic rhinitis, unspecified: Secondary | ICD-10-CM | POA: Diagnosis not present

## 2023-06-08 DIAGNOSIS — I5020 Unspecified systolic (congestive) heart failure: Secondary | ICD-10-CM | POA: Diagnosis not present

## 2023-06-08 DIAGNOSIS — J449 Chronic obstructive pulmonary disease, unspecified: Secondary | ICD-10-CM | POA: Diagnosis not present

## 2023-06-08 DIAGNOSIS — H900 Conductive hearing loss, bilateral: Secondary | ICD-10-CM | POA: Diagnosis not present

## 2023-06-09 ENCOUNTER — Encounter: Payer: Self-pay | Admitting: Nurse Practitioner

## 2023-06-09 ENCOUNTER — Non-Acute Institutional Stay (SKILLED_NURSING_FACILITY): Payer: Self-pay | Admitting: Nurse Practitioner

## 2023-06-09 DIAGNOSIS — W19XXXA Unspecified fall, initial encounter: Secondary | ICD-10-CM | POA: Diagnosis not present

## 2023-06-09 DIAGNOSIS — M797 Fibromyalgia: Secondary | ICD-10-CM

## 2023-06-09 DIAGNOSIS — N390 Urinary tract infection, site not specified: Secondary | ICD-10-CM

## 2023-06-09 DIAGNOSIS — M159 Polyosteoarthritis, unspecified: Secondary | ICD-10-CM | POA: Diagnosis not present

## 2023-06-09 DIAGNOSIS — N1831 Chronic kidney disease, stage 3a: Secondary | ICD-10-CM | POA: Diagnosis not present

## 2023-06-09 DIAGNOSIS — G3184 Mild cognitive impairment, so stated: Secondary | ICD-10-CM

## 2023-06-09 DIAGNOSIS — I5020 Unspecified systolic (congestive) heart failure: Secondary | ICD-10-CM | POA: Diagnosis not present

## 2023-06-09 DIAGNOSIS — D649 Anemia, unspecified: Secondary | ICD-10-CM | POA: Diagnosis not present

## 2023-06-09 DIAGNOSIS — H900 Conductive hearing loss, bilateral: Secondary | ICD-10-CM | POA: Diagnosis not present

## 2023-06-09 DIAGNOSIS — J309 Allergic rhinitis, unspecified: Secondary | ICD-10-CM | POA: Diagnosis not present

## 2023-06-09 DIAGNOSIS — K219 Gastro-esophageal reflux disease without esophagitis: Secondary | ICD-10-CM

## 2023-06-09 DIAGNOSIS — J4489 Other specified chronic obstructive pulmonary disease: Secondary | ICD-10-CM | POA: Diagnosis not present

## 2023-06-09 DIAGNOSIS — F5105 Insomnia due to other mental disorder: Secondary | ICD-10-CM

## 2023-06-09 DIAGNOSIS — M79672 Pain in left foot: Secondary | ICD-10-CM | POA: Diagnosis not present

## 2023-06-09 DIAGNOSIS — I10 Essential (primary) hypertension: Secondary | ICD-10-CM | POA: Diagnosis not present

## 2023-06-09 DIAGNOSIS — R269 Unspecified abnormalities of gait and mobility: Secondary | ICD-10-CM | POA: Diagnosis not present

## 2023-06-09 DIAGNOSIS — F02A4 Dementia in other diseases classified elsewhere, mild, with anxiety: Secondary | ICD-10-CM | POA: Diagnosis not present

## 2023-06-09 DIAGNOSIS — J449 Chronic obstructive pulmonary disease, unspecified: Secondary | ICD-10-CM | POA: Diagnosis not present

## 2023-06-09 DIAGNOSIS — I509 Heart failure, unspecified: Secondary | ICD-10-CM

## 2023-06-09 DIAGNOSIS — F419 Anxiety disorder, unspecified: Secondary | ICD-10-CM | POA: Diagnosis not present

## 2023-06-09 NOTE — Assessment & Plan Note (Signed)
 takes Ambien, Lexapro

## 2023-06-09 NOTE — Assessment & Plan Note (Signed)
 Hx of Hgb 6.8 in hospital s/p 2 units of PRBC, on  Vit B12, Iron, Hgb 9.8 01/11/23<<10.5 01/20/23

## 2023-06-09 NOTE — Assessment & Plan Note (Signed)
 under Hospice service, resides in SNF [facility] for care.

## 2023-06-09 NOTE — Assessment & Plan Note (Signed)
 s/p fall, skin tar left 5th finger and left upper arm, c/o left foot pain, pending X-ray left foot, prn Morphine available to her.

## 2023-06-09 NOTE — Assessment & Plan Note (Signed)
 walker for ambulation,  risk falling

## 2023-06-09 NOTE — Assessment & Plan Note (Signed)
 loose control Bp, Sbp 140-150s intermittently, off  Metoprolol .

## 2023-06-09 NOTE — Assessment & Plan Note (Signed)
 clinically presumed. 08/16/22 Echo mild aortic stenosis, normal left ventricular systolic function, no EF.  BNP in 364 08/12/22>>248 09/21/22, chronic edema BLE, on Furosemide. (CXR showed central pulmonary venous congestion w/o frank pulmonary edema, chronic lung disease, no acute findings of osseous structures)

## 2023-06-09 NOTE — Assessment & Plan Note (Signed)
 takes Norco, lyrica , Morphine.

## 2023-06-09 NOTE — Assessment & Plan Note (Signed)
 Chronic lower back, leg pain,  R shoulder pain, had Ortho eval, takes Lyrica, Tylenol, Norco, Morphine

## 2023-06-09 NOTE — Assessment & Plan Note (Signed)
on  Nitrofurantoin 50mg qd for UTI suppression, Dr. Ottelin.  

## 2023-06-09 NOTE — Assessment & Plan Note (Signed)
 GERD/chronic gastritis/atrophic gastritis, GI bleed 02/2020, s/p EGD, per biopsy, takes PPI,  F/u GI prn, taking Omeprazole.

## 2023-06-09 NOTE — Assessment & Plan Note (Signed)
 intermittent chronic mild expiratory wheezes, c/o SOB, uses O2,  takes Benzonatate, DuoNeb, prn Albuterol/Budsonide, cough is chronic, on Prednisone low dose. treated with higher dose of Prednisone for symptomatic control.

## 2023-06-09 NOTE — Progress Notes (Unsigned)
 Location:   SNF [facility] Room Number: 60A Place of Service:  SNF (31) Provider: Abner Hoffman Emileo Semel NP  Itzia Cunliffe X, NP  Patient Care Team: Audra Kagel X, NP as PCP - General (Internal Medicine) Brainard Highfill X, NP as Nurse Practitioner (Internal Medicine)  Extended Emergency Contact Information Primary Emergency Contact: Ibbotson,Jeff Home Phone: [phone] Relation: Son Secondary Emergency Contact: Culbreth Sr.,Christopher Home Phone: [phone] Relation: Relative  Code Status: DNR Goals of care: Advanced Directive information    05/06/2023   11:22 AM  Advanced Directives  Does Patient Have a Medical Advance Directive? Yes  Type of Advance Directive Living will;Out of facility DNR (pink MOST or yellow form)  Pre-existing out of facility DNR order (yellow form or pink MOST form) Yellow form placed in chart (order not valid for inpatient use)     Chief Complaint  Patient presents with  . Acute Visit    Fall, left foot pain, skin tears    HPI:  Pt is a 89 y.o. female seen today for an acute visit for s/p fall, skin tar left 5th finger and left upper arm, c/o left foot pain, pending X-ray left foot, prn Morphine available to her.   Gait abnormality, walker for ambulation,  risk falling             CHF clinically presumed. 08/16/22 Echo mild aortic stenosis, normal left ventricular systolic function, no EF.  BNP in 364 08/12/22>>248 09/21/22, chronic edema BLE, on Furosemide . (CXR showed central pulmonary venous congestion w/o frank pulmonary edema, chronic lung disease, no acute findings of osseous structures)             COPD, intermittent chronic mild expiratory wheezes, c/o SOB, uses O2,  takes Benzonatate , DuoNeb, prn Albuterol /Budsonide, cough is chronic, on Prednisone  low dose. treated with higher dose of Prednisone  for symptomatic control.  Hx of CVA, CT x2 03/2021 showed no acute findings except age indeterminate right corona radiator infarct, small vessel changes. Neurology  recommended no further work ups.              Macular degeneration, f/u [facility]: OU for [place] MD involving central area. OS exudative age related MD             Chronic lower back, leg pain,  R shoulder pain, had Ortho eval, takes Lyrica , Tylenol , Norco, Morphine             HTN, loose control Bp, Sbp 140-150s intermittently, off  Metoprolol .              Urinary symptoms, on  Nitrofurantoin 50mg  qd for UTI suppression, Dr. Ottelin             Anemia, Hx of Hgb 6.8 in hospital s/p 2 units of PRBC, on  Vit B12, Iron, Hgb 9.8 01/11/23<<10.5 01/20/23             CKD Bun/creat 19/1.13 01/20/23             GERD/chronic gastritis/atrophic gastritis, GI bleed 02/2020, s/p EGD, per biopsy, takes PPI,  F/u GI prn, taking Omeprazole .  Fibromyalgia takes Norco, lyrica , Morphine.              Tactile hallucinations, Hx of, off meds. TSH 1.03              Constipation, takes MiraLax ,  Colace               Prediabetes, Hgb a1c 6.6 08/12/22  Hypokalemia, K 3.9 01/11/23             Insomnia, takes Ambien , Lexapro             [facility], under Hospice service, resides in [facility] for care.        Past Medical History:  Diagnosis Date  . Acute blood loss anemia 08/23/2013   04/13/16 TSH 1.20, Na 141, K 4.2, Bun 15, creat 0.79, wbc 5.5, Hgb 11.5, plt 283  . Anxiety   . Anxiety state 07/02/2007   Qualifier: Diagnosis of  By: Elinor Guardian MD, Raenelle Bumpers   . Asthma   . 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 Cigarette smoker   . Colon polyps 2009   TWO TUBULAR ADENOMAS AND HYPERPLASTIC POLYPS  . Congestive heart failure (CHF) (HCC) 08/13/2022  . Constipation 09/14/2013  . COPD (chronic obstructive pulmonary disease) (HCC)   . Diverticulosis of colon   . DJD (degenerative joint disease)   . Dog bite of limb 07/14/2010   Dog bite 07/10/10 - dogs shots utd Last td was 08/2009  Localized tx only.    . Fibromyalgia   . GERD 12/19/2006   Qualifier: Diagnosis of  By: Avis Boehringer CMA, Cleave Curling  04/13/16 TSH 1.20, Na  141, K 4.2, Bun 15, creat 0.79, wbc 5.5, Hgb 11.5, plt 283   . GERD (gastroesophageal reflux disease)   . Hammer toe of second toe of left foot 04/08/2016   Left 2nd  . Hearing loss   . Hemorrhoids   . Hypercholesterolemia   . Hypertension   . Osteoarthritis 12/19/2006   Qualifier: Diagnosis of  By: Avis Boehringer CMA, Cleave Curling    . Osteoporosis    Past Surgical History:  Procedure Laterality Date  . BIOPSY  02/17/2020   Procedure: BIOPSY;  Surgeon: Lajuan Pila, MD;  Location: [facility];  Service: Endoscopy;;  . CATARACT EXTRACTION, BILATERAL  2009  . COLONOSCOPY  2009, 2006 , 2017  . ESOPHAGOGASTRODUODENOSCOPY (EGD) WITH PROPOFOL  N/A 02/17/2020   Procedure: ESOPHAGOGASTRODUODENOSCOPY (EGD) WITH PROPOFOL ;  Surgeon: Lajuan Pila, MD;  Location: [facility];  Service: Endoscopy;  Laterality: N/A;  . IR ANGIOGRAM SELECTIVE EACH ADDITIONAL VESSEL  02/17/2020  . IR ANGIOGRAM SELECTIVE EACH ADDITIONAL VESSEL  02/17/2020  . IR ANGIOGRAM SELECTIVE EACH ADDITIONAL VESSEL  02/17/2020  . IR ANGIOGRAM SELECTIVE EACH ADDITIONAL VESSEL  02/17/2020  . IR ANGIOGRAM SELECTIVE EACH ADDITIONAL VESSEL  02/17/2020  . IR ANGIOGRAM VISCERAL SELECTIVE  02/17/2020  . IR ANGIOGRAM VISCERAL SELECTIVE  02/17/2020  . IR US  GUIDE VASC ACCESS RIGHT  02/17/2020  . stapes surgery     Dr Jodelle Mungo  . TONSILLECTOMY AND ADENOIDECTOMY     as a child    Allergies  Allergen Reactions  . Penicillins Anaphylaxis  . Bisphosphonates Other (See Comments)    [name]  . Influenza Vaccines     Unknown  . Simvastatin     Unknown   . Trazodone  And Nefazodone     Felt her throat was closing up/kept her awake  . Sulfonamide Derivatives Rash and Other (See Comments)    blisters    Allergies as of 06/09/2023       Reactions   Penicillins Anaphylaxis   Bisphosphonates Other (See Comments)   [name]   Influenza Vaccines    Unknown   Simvastatin    Unknown    Trazodone  And Nefazodone    Felt her throat was closing up/kept her  awake   Sulfonamide Derivatives Rash, Other (  See Comments)   blisters        Medication List        Accurate as of June 09, 2023 11:59 PM. If you have any questions, ask your nurse or doctor.          acetaminophen  325 MG tablet Commonly known as: TYLENOL  Take 650 mg by mouth every 4 (four) hours as needed for fever.  Give 650 mg by mouth at bedtime   albuterol  108 (90 Base) MCG/ACT inhaler Commonly known as: VENTOLIN  HFA Inhale into the lungs every 6 (six) hours as needed for wheezing or shortness of breath.   Albuterol -Budesonide 90-80 MCG/ACT Aero Inhale 2 puffs into the lungs every 6 (six) hours as needed (shortness of breath/ wheezing).   Antacid 750 MG chewable tablet Generic drug: calcium  carbonate Chew 1 tablet by mouth daily.   benzonatate  100 MG capsule Commonly known as: TESSALON  Take 1 capsule (100 mg total) by mouth 2 (two) times daily as needed for cough.   budesonide-formoterol 80-4.5 MCG/ACT inhaler Commonly known as: SYMBICORT Inhale 1 puff into the lungs daily.   budesonide-formoterol 160-4.5 MCG/ACT inhaler Commonly known as: SYMBICORT Inhale 2 puffs into the lungs daily.   cholecalciferol 1000 units tablet Commonly known as: VITAMIN D  Take 1,000 Units by mouth daily.   cyanocobalamin  1000 MCG tablet Commonly known as: VITAMIN B12 Take 1,000 mcg by mouth daily.   docusate sodium  100 MG capsule Commonly known as: COLACE Take 100 mg by mouth 2 (two) times daily.   doxycycline 100 MG tablet Commonly known as: VIBRA-TABS Take 100 mg by mouth every 12 (twelve) hours.   escitalopram 20 MG tablet Commonly known as: LEXAPRO Take 20 mg by mouth at bedtime.   feeding supplement (PRO-STAT 64) Liqd Take 30 mLs by mouth daily.   ferrous sulfate 325 (65 FE) MG tablet Take 325 mg by mouth. Once A Day on Mon, Wed, Fri   furosemide  40 MG tablet Commonly known as: LASIX  Take 40 mg by mouth 2 (two) times daily.   furosemide  40 MG  tablet Commonly known as: LASIX  Take 1 tablet (40 mg total) by mouth daily for 2 days.   HYDROcodone -acetaminophen  5-325 MG tablet Commonly known as: NORCO/VICODIN Take 1 tablet by mouth every 6 (six) hours as needed for moderate pain (pain score 4-6).   hydrocortisone  cream 1 % Apply 1 application topically 2 (two) times daily as needed (wrists).   hydrocortisone  valerate cream 0.2 % Commonly known as: WESTCORT  Apply 1 application topically 2 (two) times daily as needed (for itching). Cleanse behind ears with gentle cleanser and dry completely.   ipratropium-albuterol  0.5-2.5 (3) MG/3ML Soln Commonly known as: DUONEB Take 3 mLs by nebulization in the morning and at bedtime. And 1 vial inhale orally every 24 hours as needed for Wheezing   ketoconazole 2 % cream Commonly known as: NIZORAL Apply 1 application  topically 2 (two) times daily as needed for irritation.   morphine CONCENTRATE 10 mg / 0.5 ml concentrated solution   nitrofurantoin 50 MG capsule Commonly known as: MACRODANTIN Take 50 mg by mouth at bedtime.   Olopatadine HCl 0.2 % Soln Place 1 drop into both eyes daily.   omeprazole  20 MG capsule Commonly known as: PRILOSEC Take 20 mg by mouth daily.   ondansetron  4 MG tablet Commonly known as: ZOFRAN  Take 4 mg by mouth every 8 (eight) hours as needed for nausea or vomiting.   polyethylene glycol 17 g packet Commonly known as: MIRALAX  / GLYCOLAX   Take 17 g by mouth 2 (two) times a week. Sunday and Wednesday   potassium chloride  SA 20 MEQ tablet Commonly known as: KLOR-CON  M Take 20 mEq by mouth 2 (two) times daily.   predniSONE  5 MG tablet Commonly known as: DELTASONE  Take 5 mg by mouth daily with breakfast.   pregabalin  75 MG capsule Commonly known as: LYRICA  Take 1 capsule (75 mg total) by mouth at bedtime.   Sarna Sensitive 1 % Lotn Generic drug: pramoxine Apply to cheeks topically one time a day for rosy burning cheeks   Systane Complete 0.6 %  Soln Generic drug: Propylene Glycol Instill 1 drop in both eyes four times a day for DRY EYE   Voltaren 1 % Gel Generic drug: diclofenac Sodium Apply 2 g topically every 8 (eight) hours as needed.   zolpidem  5 MG tablet Commonly known as: Ambien  Take 1 tablet (5 mg total) by mouth at bedtime.        Review of Systems  Constitutional:  Negative for appetite change, fatigue and fever.  HENT:  Positive for hearing loss. Negative for congestion and voice change.   Eyes:  Negative for visual disturbance.       C/o blurred vision, ? Color, ? Right eye peripheral vision loss-f/u Ophthalmology  Respiratory:  Positive for cough and shortness of breath. Negative for chest tightness and wheezing.        DOE, chronic hacking cough  Cardiovascular:  Positive for leg swelling. Negative for chest pain and palpitations.  Gastrointestinal:  Negative for abdominal pain and constipation.  Genitourinary:  Negative for dysuria, genital sores and urgency.       Chronic, on and off  Musculoskeletal:  Positive for arthralgias, back pain, gait problem and myalgias. Negative for joint swelling.  Skin:  Positive for wound.       Skin tears  Neurological:  Negative for speech difficulty, weakness and light-headedness.  Psychiatric/Behavioral:  Negative for behavioral problems, hallucinations and sleep disturbance.        Chronic going to bed late, sleeping in late.     Immunization History  Administered Date(s) Administered  . Moderna SARS-COV2 Booster Vaccination 12/25/2019  . Moderna Sars-Covid-2 Vaccination 02/17/2019, 03/17/2019  . Pneumococcal Conjugate-13 09/01/2015  . Pneumococcal Polysaccharide-23 07/07/1998  . Td 08/21/2009  . Tdap 09/22/2022   Pertinent  Health Maintenance Due  Topic Date Due  . DEXA SCAN  Completed  . INFLUENZA VACCINE  Discontinued      03/20/2021   12:00 AM 03/20/2021   12:00 PM 02/05/2022    9:16 AM 03/09/2022   10:01 AM 07/23/2022    3:44 PM  Fall Risk  Falls in  the past year?   0 0 0  Was there an injury with Fall?   0 0 0  Fall Risk Category Calculator   0 0 0  Fall Risk Category (Retired)   Low    (RETIRED) Patient Fall Risk Level High fall risk High fall risk High fall risk    Patient at Risk for Falls Due to   History of fall(s) No Fall Risks No Fall Risks  Fall risk Follow up   Falls evaluation completed Falls evaluation completed Falls evaluation completed   Functional Status Survey:    Vitals:   06/09/23 1636 06/10/23 1100  BP: (!) 161/75 (!) 161/75  Pulse: 76   Resp: 17   Temp: (!) 97.3 F (36.3 C)   SpO2: 96%   Weight: 156 lb 9.6 oz (71 kg)  Body mass index is 29.59 kg/m. Physical Exam Vitals and nursing note reviewed.  Constitutional:      Appearance: Normal appearance.  HENT:     Head: Normocephalic and atraumatic.     Nose: Nose normal.     Mouth/Throat:     Mouth: Mucous membranes are moist.  Eyes:     Extraocular Movements: Extraocular movements intact.     Conjunctiva/sclera: Conjunctivae normal.     Pupils: Pupils are equal, round, and reactive to light.     Comments: Able to count my fingers 3 feet away from her eyes.   Cardiovascular:     Rate and Rhythm: Normal rate and regular rhythm.     Heart sounds: No murmur heard. Pulmonary:     Effort: Pulmonary effort is normal.     Breath sounds: Rales present. No wheezing.     Comments: Decreased air entry to both lungs. Needs O2 via [place] to maintain SatO2>90, rales bibasilar.  Abdominal:     General: Bowel sounds are normal.     Palpations: Abdomen is soft.     Tenderness: There is no abdominal tenderness.  Musculoskeletal:        General: Tenderness present.     Cervical back: Normal range of motion and neck supple.     Right lower leg: Edema present.     Left lower leg: Edema present.     Comments: trace edema BLE. R shoulder pain with overhead ROM.  Left foot pain     Skin:    General: Skin is warm and dry.     Findings: Bruising and erythema  present.     Comments: Skin tears, left 5th finger, left upper arm, bruise left forearm, left forehead, no s/s of infection   Neurological:     General: No focal deficit present.     Mental Status: She is alert. Mental status is at baseline.     Motor: No weakness.     Coordination: Coordination normal.     Gait: Gait abnormal.     Comments: Oriented to person, place.   Psychiatric:        Mood and Affect: Mood normal.        Behavior: Behavior normal.    Labs reviewed: Recent Labs    07/31/22 1448 08/13/22 0000 08/24/22 0000 11/09/22 0000 01/20/23 0000  NA 137   < > 141 139 139  K 3.6   < > 4.1 3.7 3.6  CL 100   < > 103 98* 98*  CO2 27   < > 31* 32* 33*  GLUCOSE 112*  --   --   --   --   BUN 16   < > 18 20 19   CREATININE 1.25*   < > 1.1 1.1 1.1  CALCIUM  9.1   < > 8.8 9.0 9.1   < > = values in this interval not displayed.   Recent Labs    07/31/22 1448 01/20/23 0000  AST 20 12*  ALT 19 12  ALKPHOS 78 117  BILITOT 1.2  --   PROT 6.1*  --   ALBUMIN 3.6 3.7   Recent Labs    07/27/22 0000 07/31/22 1448 01/20/23 0000  WBC 8.1 7.0 7.5  NEUTROABS  --   --  5,588.00  HGB 12.3 13.3 10.5*  HCT 37 41.6 33*  MCV  --  95.2  --   PLT 194 179 218   Lab Results  Component Value Date   TSH 1.03  08/13/2022   Lab Results  Component Value Date   HGBA1C 6.6 08/13/2022   Lab Results  Component Value Date   CHOL 192 03/19/2021   HDL 69 03/19/2021   LDLCALC 107 (H) 03/19/2021   LDLDIRECT 114.7 08/18/2011   TRIG 78 03/19/2021   CHOLHDL 2.8 03/19/2021    Significant Diagnostic Results in last 30 days:  No results found.  Assessment/Plan: Left foot pain s/p fall, skin tar left 5th finger and left upper arm, c/o left foot pain, pending X-ray left foot, prn Morphine available to her.   Gait abnormality walker for ambulation,  risk falling  Congestive heart failure (CHF) (HCC) clinically presumed. 08/16/22 Echo mild aortic stenosis, normal left ventricular  systolic function, no EF.  BNP in 364 08/12/22>>248 09/21/22, chronic edema BLE, on Furosemide . (CXR showed central pulmonary venous congestion w/o frank pulmonary edema, chronic lung disease, no acute findings of osseous structures)  COPD (chronic obstructive pulmonary disease) with chronic bronchitis  intermittent chronic mild expiratory wheezes, c/o SOB, uses O2,  takes Benzonatate , DuoNeb, prn Albuterol /Budsonide, cough is chronic, on Prednisone  low dose. treated with higher dose of Prednisone  for symptomatic control.   Osteoarthritis Chronic lower back, leg pain,  R shoulder pain, had Ortho eval, takes Lyrica , Tylenol , Norco, Morphine  HTN (hypertension) loose control Bp, Sbp 140-150s intermittently, off  Metoprolol .   UTI (urinary tract infection) on  Nitrofurantoin 50mg  qd for UTI suppression, Dr. Ottelin  Chronic anemia Hx of Hgb 6.8 in hospital s/p 2 units of PRBC, on  Vit B12, Iron, Hgb 9.8 01/11/23<<10.5 01/20/23  GERD  GERD/chronic gastritis/atrophic gastritis, GI bleed 02/2020, s/p EGD, per biopsy, takes PPI,  F/u GI prn, taking Omeprazole .   Fibromyalgia takes Norco, lyrica , Morphine.   Insomnia secondary to anxiety takes Ambien , Lexapro            Mild cognitive impairment under Hospice service, resides in SNF [facility] for care.         Family/ staff Communication: plan of care reviewed with the patient and charge nurse  Labs/tests ordered:  pending X-ray left foot.

## 2023-06-10 DIAGNOSIS — H900 Conductive hearing loss, bilateral: Secondary | ICD-10-CM | POA: Diagnosis not present

## 2023-06-10 DIAGNOSIS — I5020 Unspecified systolic (congestive) heart failure: Secondary | ICD-10-CM | POA: Diagnosis not present

## 2023-06-10 DIAGNOSIS — N1831 Chronic kidney disease, stage 3a: Secondary | ICD-10-CM | POA: Diagnosis not present

## 2023-06-10 DIAGNOSIS — J449 Chronic obstructive pulmonary disease, unspecified: Secondary | ICD-10-CM | POA: Diagnosis not present

## 2023-06-10 DIAGNOSIS — J309 Allergic rhinitis, unspecified: Secondary | ICD-10-CM | POA: Diagnosis not present

## 2023-06-10 DIAGNOSIS — F02A4 Dementia in other diseases classified elsewhere, mild, with anxiety: Secondary | ICD-10-CM | POA: Diagnosis not present

## 2023-06-13 DIAGNOSIS — F02A4 Dementia in other diseases classified elsewhere, mild, with anxiety: Secondary | ICD-10-CM | POA: Diagnosis not present

## 2023-06-13 DIAGNOSIS — J309 Allergic rhinitis, unspecified: Secondary | ICD-10-CM | POA: Diagnosis not present

## 2023-06-13 DIAGNOSIS — I5020 Unspecified systolic (congestive) heart failure: Secondary | ICD-10-CM | POA: Diagnosis not present

## 2023-06-13 DIAGNOSIS — N1831 Chronic kidney disease, stage 3a: Secondary | ICD-10-CM | POA: Diagnosis not present

## 2023-06-13 DIAGNOSIS — J449 Chronic obstructive pulmonary disease, unspecified: Secondary | ICD-10-CM | POA: Diagnosis not present

## 2023-06-13 DIAGNOSIS — H900 Conductive hearing loss, bilateral: Secondary | ICD-10-CM | POA: Diagnosis not present

## 2023-06-14 DIAGNOSIS — F02A4 Dementia in other diseases classified elsewhere, mild, with anxiety: Secondary | ICD-10-CM | POA: Diagnosis not present

## 2023-06-14 DIAGNOSIS — J309 Allergic rhinitis, unspecified: Secondary | ICD-10-CM | POA: Diagnosis not present

## 2023-06-14 DIAGNOSIS — I5020 Unspecified systolic (congestive) heart failure: Secondary | ICD-10-CM | POA: Diagnosis not present

## 2023-06-14 DIAGNOSIS — J449 Chronic obstructive pulmonary disease, unspecified: Secondary | ICD-10-CM | POA: Diagnosis not present

## 2023-06-14 DIAGNOSIS — H900 Conductive hearing loss, bilateral: Secondary | ICD-10-CM | POA: Diagnosis not present

## 2023-06-14 DIAGNOSIS — N1831 Chronic kidney disease, stage 3a: Secondary | ICD-10-CM | POA: Diagnosis not present

## 2023-06-16 DIAGNOSIS — D649 Anemia, unspecified: Secondary | ICD-10-CM | POA: Diagnosis not present

## 2023-06-16 DIAGNOSIS — J309 Allergic rhinitis, unspecified: Secondary | ICD-10-CM | POA: Diagnosis not present

## 2023-06-16 DIAGNOSIS — J42 Unspecified chronic bronchitis: Secondary | ICD-10-CM | POA: Diagnosis not present

## 2023-06-16 DIAGNOSIS — R12 Heartburn: Secondary | ICD-10-CM | POA: Diagnosis not present

## 2023-06-16 DIAGNOSIS — H900 Conductive hearing loss, bilateral: Secondary | ICD-10-CM | POA: Diagnosis not present

## 2023-06-16 DIAGNOSIS — K219 Gastro-esophageal reflux disease without esophagitis: Secondary | ICD-10-CM | POA: Diagnosis not present

## 2023-06-16 DIAGNOSIS — J449 Chronic obstructive pulmonary disease, unspecified: Secondary | ICD-10-CM | POA: Diagnosis not present

## 2023-06-16 DIAGNOSIS — G4700 Insomnia, unspecified: Secondary | ICD-10-CM | POA: Diagnosis not present

## 2023-06-16 DIAGNOSIS — F32A Depression, unspecified: Secondary | ICD-10-CM | POA: Diagnosis not present

## 2023-06-16 DIAGNOSIS — I5020 Unspecified systolic (congestive) heart failure: Secondary | ICD-10-CM | POA: Diagnosis not present

## 2023-06-16 DIAGNOSIS — N1831 Chronic kidney disease, stage 3a: Secondary | ICD-10-CM | POA: Diagnosis not present

## 2023-06-16 DIAGNOSIS — F02A4 Dementia in other diseases classified elsewhere, mild, with anxiety: Secondary | ICD-10-CM | POA: Diagnosis not present

## 2023-06-17 ENCOUNTER — Encounter: Payer: Self-pay | Admitting: Sports Medicine

## 2023-06-17 ENCOUNTER — Non-Acute Institutional Stay (SKILLED_NURSING_FACILITY): Payer: Self-pay | Admitting: Sports Medicine

## 2023-06-17 DIAGNOSIS — J309 Allergic rhinitis, unspecified: Secondary | ICD-10-CM | POA: Diagnosis not present

## 2023-06-17 DIAGNOSIS — I5020 Unspecified systolic (congestive) heart failure: Secondary | ICD-10-CM | POA: Diagnosis not present

## 2023-06-17 DIAGNOSIS — L03115 Cellulitis of right lower limb: Secondary | ICD-10-CM | POA: Diagnosis not present

## 2023-06-17 DIAGNOSIS — N1831 Chronic kidney disease, stage 3a: Secondary | ICD-10-CM | POA: Diagnosis not present

## 2023-06-17 DIAGNOSIS — M7989 Other specified soft tissue disorders: Secondary | ICD-10-CM | POA: Diagnosis not present

## 2023-06-17 DIAGNOSIS — H900 Conductive hearing loss, bilateral: Secondary | ICD-10-CM | POA: Diagnosis not present

## 2023-06-17 DIAGNOSIS — J449 Chronic obstructive pulmonary disease, unspecified: Secondary | ICD-10-CM | POA: Diagnosis not present

## 2023-06-17 DIAGNOSIS — F02A4 Dementia in other diseases classified elsewhere, mild, with anxiety: Secondary | ICD-10-CM | POA: Diagnosis not present

## 2023-06-17 NOTE — Progress Notes (Unsigned)
 Provider:  Dr. Tye Gall Location:  [facility] Place of Service:   Skilled care  PCP: Mast, Man X, NP Patient Care Team: Mast, Man X, NP as PCP - General (Internal Medicine) Mast, Man X, NP as Nurse Practitioner (Internal Medicine)  Extended Emergency Contact Information Primary Emergency Contact: Guerrero,Jeff Home Phone: [phone] Relation: Son Secondary Emergency Contact: Culbreth Sr.,Christopher Home Phone: [phone] Relation: Relative  Goals of Care: Advanced Directive information    05/06/2023   11:22 AM  Advanced Directives  Does Patient Have a Medical Advance Directive? Yes  Type of Advance Directive Living will;Out of facility DNR (pink MOST or yellow form)  Pre-existing out of facility DNR order (yellow form or pink MOST form) Yellow form placed in chart (order not valid for inpatient use)       Non hospice related visit      History of Present Illness  89 yr old F with h/o CHF, COPD, CVA, HTN is evaluated for acute visit for redness and discharge from Rt leg wound   Pt seen and examined in her room  C/o pain in her RT leg  As per hospice nurse pt has increased swelling and wound has been draining serous drainage since last few days Pt c/o pain in her Rt leg  As per staff pt is declining, she is confused at baseline Not eating much  She has exertional SOB on 3 lit supplemental o2 Able to walk few steps in her room with the help of walker     Past Medical History:  Diagnosis Date   Acute blood loss anemia 08/23/2013   04/13/16 TSH 1.20, Na 141, K 4.2, Bun 15, creat 0.79, wbc 5.5, Hgb 11.5, plt 283   Anxiety    Anxiety state 07/02/2007   Qualifier: Diagnosis of  By: Elinor Guardian MD, Raenelle Bumpers    Asthma    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     Fibromyalgia    GERD 12/19/2006   Qualifier: Diagnosis of  By: Avis Boehringer CMA, Cleave Curling  04/13/16 TSH 1.20, Na 141, K 4.2, Bun 15, creat 0.79, wbc 5.5, Hgb 11.5, plt 283    GERD (gastroesophageal reflux disease)    Hammer toe of second toe of left foot 04/08/2016   Left 2nd   Hearing loss    Hemorrhoids    Hypercholesterolemia    Hypertension    Osteoarthritis 12/19/2006   Qualifier: Diagnosis of  By: Avis Boehringer CMA, Leigh     Osteoporosis    Past Surgical History:  Procedure Laterality Date   BIOPSY  02/17/2020   Procedure: BIOPSY;  Surgeon: Lajuan Pila, MD;  Location: [facility];  Service: Endoscopy;;   CATARACT EXTRACTION, BILATERAL  2009   COLONOSCOPY  2009, 2006 , 2017   ESOPHAGOGASTRODUODENOSCOPY (EGD) WITH PROPOFOL  N/A 02/17/2020   Procedure: ESOPHAGOGASTRODUODENOSCOPY (EGD) WITH PROPOFOL ;  Surgeon: Lajuan Pila, MD;  Location: [facility];  Service: Endoscopy;  Laterality: N/A;   IR ANGIOGRAM SELECTIVE EACH ADDITIONAL VESSEL  02/17/2020   IR ANGIOGRAM SELECTIVE EACH ADDITIONAL VESSEL  02/17/2020   IR  ANGIOGRAM SELECTIVE EACH ADDITIONAL VESSEL  02/17/2020   IR ANGIOGRAM SELECTIVE EACH ADDITIONAL VESSEL  02/17/2020   IR ANGIOGRAM SELECTIVE EACH ADDITIONAL VESSEL  02/17/2020   IR ANGIOGRAM VISCERAL SELECTIVE  02/17/2020   IR ANGIOGRAM VISCERAL SELECTIVE  02/17/2020   IR US  GUIDE VASC ACCESS RIGHT  02/17/2020   stapes surgery     Dr Jodelle Mungo   TONSILLECTOMY AND ADENOIDECTOMY     as a child    reports that she quit smoking about 13 years ago. Her smoking use included cigarettes. She has never used smokeless tobacco. She reports current alcohol use. She reports that she does not use drugs. Social History   Socioeconomic History   Marital status: Widowed    Spouse name: Not on file   Number of  children: Not on file   Years of education: Not on file   Highest education level: Not on file  Occupational History   Occupation: retired   Occupation: works some weekends at [employer]  Tobacco Use   Smoking status: Former    Current packs/day: 0.00    Types: Cigarettes    Quit date: 02/15/2010    Years since quitting: 13.3   Smokeless tobacco: Never  Vaping Use   Vaping status: Never Used  Substance and Sexual Activity   Alcohol use: Yes    Comment: occasional glass of wine   Drug use: No   Sexual activity: Never  Other Topics Concern   Not on file  Social History Narrative   Pt works some weekends at [employer]   Caffeine use - daily coffee in the mornings   Patient gets regular exercise > tries to walk daily for exercise   M[facility] [place] [facility] 03/11/16   Widowed   Former Smoker-stopped 2012   Alcohol occasionally glass of wine   Living Will   Social Drivers of Corporate investment banker Strain: Low Risk  (11/08/2017)   Overall Financial Resource Strain (CARDIA)    Difficulty of Paying Living Expenses: Not hard at all  Food Insecurity: No Food Insecurity (11/08/2017)   Hunger Vital Sign    Worried About Running Out of Food in the Last Year: Never true    Ran Out of Food in the Last Year: Never true  Transportation Needs: No Transportation Needs (11/08/2017)   PRAPARE - Administrator, Civil Service (Medical): No    Lack of Transportation (Non-Medical): No  Physical Activity: Sufficiently Active (11/08/2017)   Exercise Vital Sign    Days of Exercise per Week: 5 days    Minutes of Exercise per Session: 30 min  Stress: No Stress Concern Present (11/08/2017)   [employer] of Occupational Health - Occupational Stress Questionnaire    Feeling of Stress : Not at all  Social Connections: Moderately Isolated (11/08/2017)   Social Connection and Isolation Panel [NHANES]    Frequency of Communication with Friends and Family: More than  three times a week    Frequency of Social Gatherings with Friends and Family: Twice a week    Attends Religious Services: Never    Database administrator or Organizations: No    Attends Banker Meetings: Never    Marital Status: Widowed  Intimate Partner Violence: Not At Risk (11/08/2017)   Humiliation, Afraid, Rape, and Kick questionnaire    Fear of Current or Ex-Partner: No    Emotionally Abused: No    Physically Abused: No    Sexually Abused: No  Functional Status Survey:    Family History  Problem Relation Age of Onset   Heart disease Father    COPD Father    Hypertension Mother    Arthritis Mother     Health Maintenance  Topic Date Due   DTaP/Tdap/Td (3 - Td or Tdap) 09/21/2032   DEXA SCAN  Completed   HPV VACCINES  Aged Out   Meningococcal B Vaccine  Aged Out   Pneumonia Vaccine 69+ Years old  Discontinued   INFLUENZA VACCINE  Discontinued   COVID-19 Vaccine  Discontinued   Zoster Vaccines- Shingrix  Discontinued    Allergies  Allergen Reactions   Penicillins Anaphylaxis   Bisphosphonates Other (See Comments)    [name]   Influenza Vaccines     Unknown   Simvastatin     Unknown    Trazodone  And Nefazodone     Felt her throat was closing up/kept her awake   Sulfonamide Derivatives Rash and Other (See Comments)    blisters    Outpatient Encounter Medications as of 06/17/2023  Medication Sig   acetaminophen  (TYLENOL ) 325 MG tablet Take 650 mg by mouth every 4 (four) hours as needed for fever.  Give 650 mg by mouth at bedtime   albuterol  (VENTOLIN  HFA) 108 (90 Base) MCG/ACT inhaler Inhale into the lungs every 6 (six) hours as needed for wheezing or shortness of breath.   Albuterol -Budesonide 90-80 MCG/ACT AERO Inhale 2 puffs into the lungs every 6 (six) hours as needed (shortness of breath/ wheezing). (Patient not taking: Reported on 04/14/2023)   Amino Acids-Protein Hydrolys (FEEDING SUPPLEMENT, PRO-STAT 64,) LIQD Take 30 mLs by mouth  daily.   benzonatate  (TESSALON ) 100 MG capsule Take 1 capsule (100 mg total) by mouth 2 (two) times daily as needed for cough.   budesonide-formoterol (SYMBICORT) 160-4.5 MCG/ACT inhaler Inhale 2 puffs into the lungs daily.   budesonide-formoterol (SYMBICORT) 80-4.5 MCG/ACT inhaler Inhale 1 puff into the lungs daily. (Patient not taking: Reported on 04/14/2023)   calcium  carbonate (ANTACID) 750 MG chewable tablet Chew 1 tablet by mouth daily.   cholecalciferol (VITAMIN D ) 1000 UNITS tablet Take 1,000 Units by mouth daily.   diclofenac Sodium (VOLTAREN) 1 % GEL Apply 2 g topically every 8 (eight) hours as needed.   docusate sodium  (COLACE) 100 MG capsule Take 100 mg by mouth 2 (two) times daily.   doxycycline (VIBRA-TABS) 100 MG tablet Take 100 mg by mouth every 12 (twelve) hours.   escitalopram (LEXAPRO) 20 MG tablet Take 20 mg by mouth at bedtime.   ferrous sulfate 325 (65 FE) MG tablet Take 325 mg by mouth. Once A Day on Mon, Wed, Fri   furosemide  (LASIX ) 40 MG tablet Take 1 tablet (40 mg total) by mouth daily for 2 days. (Patient not taking: Reported on 05/06/2023)   furosemide  (LASIX ) 40 MG tablet Take 40 mg by mouth 2 (two) times daily.   HYDROcodone -acetaminophen  (NORCO/VICODIN) 5-325 MG tablet Take 1 tablet by mouth every 6 (six) hours as needed for moderate pain (pain score 4-6).   hydrocortisone  cream 1 % Apply 1 application topically 2 (two) times daily as needed (wrists). (Patient not taking: Reported on 05/06/2023)   hydrocortisone  valerate cream (WESTCORT ) 0.2 % Apply 1 application topically 2 (two) times daily as needed (for itching). Cleanse behind ears with gentle cleanser and dry completely. (Patient not taking: Reported on 05/06/2023)   ipratropium-albuterol  (DUONEB) 0.5-2.5 (3) MG/3ML SOLN Take 3 mLs by nebulization in the morning and at bedtime. And 1  vial inhale orally every 24 hours as needed for Wheezing   ketoconazole (NIZORAL) 2 % cream Apply 1 application  topically 2 (two)  times daily as needed for irritation.   Morphine Sulfate (MORPHINE CONCENTRATE) 10 mg / 0.5 ml concentrated solution    nitrofurantoin (MACRODANTIN) 50 MG capsule Take 50 mg by mouth at bedtime.   Olopatadine HCl 0.2 % SOLN Place 1 drop into both eyes daily.   omeprazole  (PRILOSEC) 20 MG capsule Take 20 mg by mouth daily.   ondansetron  (ZOFRAN ) 4 MG tablet Take 4 mg by mouth every 8 (eight) hours as needed for nausea or vomiting.   polyethylene glycol (MIRALAX  / GLYCOLAX ) 17 g packet Take 17 g by mouth 2 (two) times a week. Sunday and Wednesday   potassium chloride  SA (KLOR-CON  M) 20 MEQ tablet Take 20 mEq by mouth 2 (two) times daily.   pramoxine (SARNA SENSITIVE) 1 % LOTN Apply to cheeks topically one time a day for rosy burning cheeks   predniSONE  (DELTASONE ) 5 MG tablet Take 5 mg by mouth daily with breakfast.   pregabalin  (LYRICA ) 75 MG capsule Take 1 capsule (75 mg total) by mouth at bedtime.   Propylene Glycol (SYSTANE COMPLETE) 0.6 % SOLN Instill 1 drop in both eyes four times a day for DRY EYE   vitamin B-12 (CYANOCOBALAMIN ) 1000 MCG tablet Take 1,000 mcg by mouth daily.   zolpidem  (AMBIEN ) 5 MG tablet Take 1 tablet (5 mg total) by mouth at bedtime.   No facility-administered encounter medications on file as of 06/17/2023.    Review of Systems  Constitutional:  Negative for fever.  Respiratory:  Positive for shortness of breath. Negative for cough.   Cardiovascular:  Positive for leg swelling.  Genitourinary:  Negative for dysuria.  Neurological:  Negative for dizziness.  Psychiatric/Behavioral:  Positive for confusion.    Negative unless indicated in HPI.  There were no vitals filed for this visit. There is no height or weight on file to calculate BMI. BP Readings from Last 3 Encounters:  06/10/23 (!) 161/75  05/26/23 113/66  05/13/23 116/62   Wt Readings from Last 3 Encounters:  06/09/23 156 lb 9.6 oz (71 kg)  05/26/23 156 lb 9.6 oz (71 kg)  05/06/23 156 lb 9.6 oz (71  kg)   Physical Exam Constitutional:      Appearance: Normal appearance.  HENT:     Head: Normocephalic and atraumatic.  Cardiovascular:     Rate and Rhythm: Normal rate and regular rhythm.  Pulmonary:     Effort: Pulmonary effort is normal. No respiratory distress.     Breath sounds: Rales (lower lung fields) present. No wheezing.  Abdominal:     General: Bowel sounds are normal. There is no distension.     Tenderness: There is no abdominal tenderness. There is no guarding or rebound.     Comments:    Musculoskeletal:        General: Swelling present. No tenderness.     Comments: Rt lwg 2x 2 cm open shallow wound, surrounding erythema and tender to touch   Neurological:     Mental Status: She is alert. Mental status is at baseline.     Motor: No weakness.     Labs reviewed: Basic Metabolic Panel: Recent Labs    07/31/22 1448 08/13/22 0000 08/24/22 0000 11/09/22 0000 01/20/23 0000  NA 137   < > 141 139 139  K 3.6   < > 4.1 3.7 3.6  CL 100   < >  103 98* 98*  CO2 27   < > 31* 32* 33*  GLUCOSE 112*  --   --   --   --   BUN 16   < > 18 20 19   CREATININE 1.25*   < > 1.1 1.1 1.1  CALCIUM  9.1   < > 8.8 9.0 9.1   < > = values in this interval not displayed.   Liver Function Tests: Recent Labs    07/31/22 1448 01/20/23 0000  AST 20 12*  ALT 19 12  ALKPHOS 78 117  BILITOT 1.2  --   PROT 6.1*  --   ALBUMIN 3.6 3.7   No results for input(s): "LIPASE", "AMYLASE" in the last 8760 hours. No results for input(s): "AMMONIA" in the last 8760 hours. CBC: Recent Labs    07/27/22 0000 07/31/22 1448 01/20/23 0000  WBC 8.1 7.0 7.5  NEUTROABS  --   --  5,588.00  HGB 12.3 13.3 10.5*  HCT 37 41.6 33*  MCV  --  95.2  --   PLT 194 179 218   Cardiac Enzymes: No results for input(s): "CKTOTAL", "CKMB", "CKMBINDEX", "TROPONINI" in the last 8760 hours. BNP: Invalid input(s): "POCBNP" Lab Results  Component Value Date   HGBA1C 6.6 08/13/2022   Lab Results  Component  Value Date   TSH 1.03 08/13/2022   Lab Results  Component Value Date   VITAMINB12 1,966 01/20/2023   No results found for: "FOLATE" Lab Results  Component Value Date   IRON 63 01/20/2023   TIBC 199 01/20/2023   FERRITIN 203 01/20/2023    Imaging and Procedures obtained prior to SNF admission: DG Chest Port 1 View Result Date: 07/31/2022 CLINICAL DATA:  Shortness of breath. EXAM: PORTABLE CHEST 1 VIEW COMPARISON:  02/13/2016 FINDINGS: The heart size and mediastinal contours are within normal limits. Aortic atherosclerotic calcification incidentally noted. Both lungs are clear. The visualized skeletal structures are unremarkable. IMPRESSION: No active disease. Electronically Signed   By: Marlyce Sine M.D.   On: 07/31/2022 15:42    Assessment and Plan Assessment & Plan  Cellulitis  Will start doxycycline Cont with med honey Daily dressing changes Will start morphine 0.5 mg bid  Cont with morphine prn   Lower extremity swelling Lungs - basal crackles Elevate feet  Cont with lasix   Will start metolazone 2.5 mg daily x 5 days   30 min Total time spent for obtaining history,  performing a medically appropriate examination and evaluation, reviewing the tests, documenting clinical information in the electronic or other health record, care coordination (not separately reported)

## 2023-06-19 DIAGNOSIS — F02A4 Dementia in other diseases classified elsewhere, mild, with anxiety: Secondary | ICD-10-CM | POA: Diagnosis not present

## 2023-06-19 DIAGNOSIS — J309 Allergic rhinitis, unspecified: Secondary | ICD-10-CM | POA: Diagnosis not present

## 2023-06-19 DIAGNOSIS — I5020 Unspecified systolic (congestive) heart failure: Secondary | ICD-10-CM | POA: Diagnosis not present

## 2023-06-19 DIAGNOSIS — H900 Conductive hearing loss, bilateral: Secondary | ICD-10-CM | POA: Diagnosis not present

## 2023-06-19 DIAGNOSIS — J449 Chronic obstructive pulmonary disease, unspecified: Secondary | ICD-10-CM | POA: Diagnosis not present

## 2023-06-19 DIAGNOSIS — N1831 Chronic kidney disease, stage 3a: Secondary | ICD-10-CM | POA: Diagnosis not present

## 2023-06-21 DIAGNOSIS — J309 Allergic rhinitis, unspecified: Secondary | ICD-10-CM | POA: Diagnosis not present

## 2023-06-21 DIAGNOSIS — I5020 Unspecified systolic (congestive) heart failure: Secondary | ICD-10-CM | POA: Diagnosis not present

## 2023-06-21 DIAGNOSIS — N1831 Chronic kidney disease, stage 3a: Secondary | ICD-10-CM | POA: Diagnosis not present

## 2023-06-21 DIAGNOSIS — J449 Chronic obstructive pulmonary disease, unspecified: Secondary | ICD-10-CM | POA: Diagnosis not present

## 2023-06-21 DIAGNOSIS — F02A4 Dementia in other diseases classified elsewhere, mild, with anxiety: Secondary | ICD-10-CM | POA: Diagnosis not present

## 2023-06-21 DIAGNOSIS — H900 Conductive hearing loss, bilateral: Secondary | ICD-10-CM | POA: Diagnosis not present

## 2023-06-22 ENCOUNTER — Encounter: Payer: Self-pay | Admitting: Sports Medicine

## 2023-06-24 DIAGNOSIS — N1831 Chronic kidney disease, stage 3a: Secondary | ICD-10-CM | POA: Diagnosis not present

## 2023-06-24 DIAGNOSIS — J449 Chronic obstructive pulmonary disease, unspecified: Secondary | ICD-10-CM | POA: Diagnosis not present

## 2023-06-24 DIAGNOSIS — F02A4 Dementia in other diseases classified elsewhere, mild, with anxiety: Secondary | ICD-10-CM | POA: Diagnosis not present

## 2023-06-24 DIAGNOSIS — J309 Allergic rhinitis, unspecified: Secondary | ICD-10-CM | POA: Diagnosis not present

## 2023-06-24 DIAGNOSIS — I5020 Unspecified systolic (congestive) heart failure: Secondary | ICD-10-CM | POA: Diagnosis not present

## 2023-06-24 DIAGNOSIS — H900 Conductive hearing loss, bilateral: Secondary | ICD-10-CM | POA: Diagnosis not present

## 2023-06-28 DIAGNOSIS — J449 Chronic obstructive pulmonary disease, unspecified: Secondary | ICD-10-CM | POA: Diagnosis not present

## 2023-06-28 DIAGNOSIS — I5020 Unspecified systolic (congestive) heart failure: Secondary | ICD-10-CM | POA: Diagnosis not present

## 2023-06-28 DIAGNOSIS — J309 Allergic rhinitis, unspecified: Secondary | ICD-10-CM | POA: Diagnosis not present

## 2023-06-28 DIAGNOSIS — N1831 Chronic kidney disease, stage 3a: Secondary | ICD-10-CM | POA: Diagnosis not present

## 2023-06-28 DIAGNOSIS — F02A4 Dementia in other diseases classified elsewhere, mild, with anxiety: Secondary | ICD-10-CM | POA: Diagnosis not present

## 2023-06-28 DIAGNOSIS — H900 Conductive hearing loss, bilateral: Secondary | ICD-10-CM | POA: Diagnosis not present

## 2023-06-29 DIAGNOSIS — J449 Chronic obstructive pulmonary disease, unspecified: Secondary | ICD-10-CM | POA: Diagnosis not present

## 2023-06-29 DIAGNOSIS — H900 Conductive hearing loss, bilateral: Secondary | ICD-10-CM | POA: Diagnosis not present

## 2023-06-29 DIAGNOSIS — N1831 Chronic kidney disease, stage 3a: Secondary | ICD-10-CM | POA: Diagnosis not present

## 2023-06-29 DIAGNOSIS — I5020 Unspecified systolic (congestive) heart failure: Secondary | ICD-10-CM | POA: Diagnosis not present

## 2023-06-29 DIAGNOSIS — F02A4 Dementia in other diseases classified elsewhere, mild, with anxiety: Secondary | ICD-10-CM | POA: Diagnosis not present

## 2023-06-29 DIAGNOSIS — J309 Allergic rhinitis, unspecified: Secondary | ICD-10-CM | POA: Diagnosis not present

## 2023-07-01 ENCOUNTER — Non-Acute Institutional Stay (SKILLED_NURSING_FACILITY): Payer: Self-pay | Admitting: Nurse Practitioner

## 2023-07-01 ENCOUNTER — Encounter: Payer: Self-pay | Admitting: Nurse Practitioner

## 2023-07-01 DIAGNOSIS — N1831 Chronic kidney disease, stage 3a: Secondary | ICD-10-CM | POA: Diagnosis not present

## 2023-07-01 DIAGNOSIS — F419 Anxiety disorder, unspecified: Secondary | ICD-10-CM

## 2023-07-01 DIAGNOSIS — I10 Essential (primary) hypertension: Secondary | ICD-10-CM | POA: Diagnosis not present

## 2023-07-01 DIAGNOSIS — I83009 Varicose veins of unspecified lower extremity with ulcer of unspecified site: Secondary | ICD-10-CM | POA: Diagnosis not present

## 2023-07-01 DIAGNOSIS — N390 Urinary tract infection, site not specified: Secondary | ICD-10-CM | POA: Diagnosis not present

## 2023-07-01 DIAGNOSIS — D649 Anemia, unspecified: Secondary | ICD-10-CM | POA: Diagnosis not present

## 2023-07-01 DIAGNOSIS — F5105 Insomnia due to other mental disorder: Secondary | ICD-10-CM | POA: Diagnosis not present

## 2023-07-01 DIAGNOSIS — J309 Allergic rhinitis, unspecified: Secondary | ICD-10-CM | POA: Diagnosis not present

## 2023-07-01 DIAGNOSIS — G3184 Mild cognitive impairment, so stated: Secondary | ICD-10-CM | POA: Diagnosis not present

## 2023-07-01 DIAGNOSIS — F02A4 Dementia in other diseases classified elsewhere, mild, with anxiety: Secondary | ICD-10-CM | POA: Diagnosis not present

## 2023-07-01 DIAGNOSIS — J449 Chronic obstructive pulmonary disease, unspecified: Secondary | ICD-10-CM | POA: Diagnosis not present

## 2023-07-01 DIAGNOSIS — L97901 Non-pressure chronic ulcer of unspecified part of unspecified lower leg limited to breakdown of skin: Secondary | ICD-10-CM | POA: Diagnosis not present

## 2023-07-01 DIAGNOSIS — K219 Gastro-esophageal reflux disease without esophagitis: Secondary | ICD-10-CM

## 2023-07-01 DIAGNOSIS — H900 Conductive hearing loss, bilateral: Secondary | ICD-10-CM | POA: Diagnosis not present

## 2023-07-01 DIAGNOSIS — M159 Polyosteoarthritis, unspecified: Secondary | ICD-10-CM

## 2023-07-01 DIAGNOSIS — I5020 Unspecified systolic (congestive) heart failure: Secondary | ICD-10-CM | POA: Diagnosis not present

## 2023-07-01 NOTE — Progress Notes (Signed)
 Location:   SNF [facility] Room Number: 60A Place of Service:  SNF (31) Provider: Abner Hoffman Kasey Ewings NP  Elijahjames Fuelling X, NP  Patient Care Team: Ondria Oswald X, NP as PCP - General (Internal Medicine) Dayonna Selbe X, NP as Nurse Practitioner (Internal Medicine)  Extended Emergency Contact Information Primary Emergency Contact: Farrington,Jeff Home Phone: [phone] Relation: Son Secondary Emergency Contact: Culbreth Sr.,Christopher Home Phone: [phone] Relation: Relative  Code Status:  DNR Goals of care: Advanced Directive information    06/17/2023    1:05 PM  Advanced Directives  Does Patient Have a Medical Advance Directive? Yes  Type of Advance Directive Living will;Out of facility DNR (pink MOST or yellow form)  Does patient want to make changes to medical advance directive? No - Patient declined  Pre-existing out of facility DNR order (yellow form or pink MOST form) Yellow form placed in chart (order not valid for inpatient use)     Chief Complaint  Patient presents with   Medical Management of Chronic Issues    HPI:  Pt is a 89 y.o. female seen today for medical management of chronic diseases.    Treated cellulitis RLE, resolved.  Venous insufficiency/Stasis ulcers 2x RLE, not infected.  Negative X-ray left foot, prn Morphine available to her.              Gait abnormality, walker for ambulation,  risk falling             CHF clinically presumed. 08/16/22 Echo mild aortic stenosis, normal left ventricular systolic function, no EF.  BNP in 364 08/12/22>>248 09/21/22, chronic edema BLE, on Furosemide . (CXR showed central pulmonary venous congestion w/o frank pulmonary edema, chronic lung disease, no acute findings of osseous structures)             COPD, intermittent chronic mild expiratory wheezes, c/o SOB, uses O2,  takes Benzonatate , DuoNeb, prn Albuterol /Budsonide, cough is chronic, on Prednisone  low dose. treated with higher dose of Prednisone  for symptomatic control.  Hx of  CVA, CT x2 03/2021 showed no acute findings except age indeterminate right corona radiator infarct, small vessel changes. Neurology recommended no further work ups.              Macular degeneration, f/u [facility]: OU for [place] MD involving central area. OS exudative age related MD             Chronic lower back, leg pain,  R shoulder pain, had Ortho eval, takes Lyrica  and Morphine             HTN, loose control Bp, Sbp 140-150s intermittently, off  Metoprolol .              Urinary symptoms, on  Nitrofurantoin 50mg  qd for UTI suppression, Dr. Ottelin             Anemia, Hx of Hgb 6.8 in hospital s/p 2 units of PRBC, on  Vit B12, Iron, Hgb 9.8 01/11/23<<10.5 01/20/23             CKD Bun/creat 19/1.13 01/20/23             GERD/chronic gastritis/atrophic gastritis, GI bleed 02/2020, s/p EGD, per biopsy, takes PPI,  F/u GI prn.  Fibromyalgia takes  lyrica , Morphine.              Tactile hallucinations, Hx of, off meds. TSH 1.03              Constipation, takes MiraLax ,  Colace  Prediabetes, Hgb a1c 6.6 08/12/22             Hypokalemia, K 3.9 01/11/23             Insomnia, takes Ambien , Lexapro, Lorazepam.            [facility], under Hospice service, resides in [facility] for care.          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     Fibromyalgia     GERD 12/19/2006   Qualifier: Diagnosis of  By: Avis Boehringer CMA, Cleave Curling  04/13/16 TSH 1.20, Na 141, K 4.2, Bun 15, creat 0.79, wbc 5.5, Hgb 11.5, plt 283    GERD (gastroesophageal reflux disease)    Hammer toe of second toe of left foot 04/08/2016   Left 2nd   Hearing loss    Hemorrhoids    Hypercholesterolemia    Hypertension    Osteoarthritis 12/19/2006   Qualifier: Diagnosis of  By: Avis Boehringer CMA, Leigh     Osteoporosis    Past Surgical History:  Procedure Laterality Date   BIOPSY  02/17/2020   Procedure: BIOPSY;  Surgeon: Lajuan Pila, MD;  Location: [facility];  Service: Endoscopy;;   CATARACT EXTRACTION, BILATERAL  2009   COLONOSCOPY  2009, 2006 , 2017   ESOPHAGOGASTRODUODENOSCOPY (EGD) WITH PROPOFOL  N/A 02/17/2020   Procedure: ESOPHAGOGASTRODUODENOSCOPY (EGD) WITH PROPOFOL ;  Surgeon: Lajuan Pila, MD;  Location: [facility];  Service: Endoscopy;  Laterality: N/A;   IR ANGIOGRAM SELECTIVE EACH ADDITIONAL VESSEL  02/17/2020   IR ANGIOGRAM SELECTIVE EACH ADDITIONAL VESSEL  02/17/2020   IR ANGIOGRAM SELECTIVE EACH ADDITIONAL VESSEL  02/17/2020   IR ANGIOGRAM SELECTIVE EACH ADDITIONAL VESSEL  02/17/2020   IR ANGIOGRAM SELECTIVE EACH ADDITIONAL VESSEL  02/17/2020   IR ANGIOGRAM VISCERAL SELECTIVE  02/17/2020   IR ANGIOGRAM VISCERAL SELECTIVE  02/17/2020   IR US  GUIDE VASC ACCESS RIGHT  02/17/2020   stapes surgery     Dr Jodelle Mungo   TONSILLECTOMY AND ADENOIDECTOMY     as a child    Allergies  Allergen Reactions   Penicillins Anaphylaxis   Bisphosphonates Other (See Comments)    [name]   Influenza Vaccines     Unknown   Simvastatin     Unknown    Trazodone  And Nefazodone     Felt her throat was closing up/kept her awake   Sulfonamide Derivatives Rash and Other (See Comments)    blisters    Allergies as of 07/01/2023       Reactions   Penicillins Anaphylaxis   Bisphosphonates Other (See Comments)   [name]   Influenza Vaccines    Unknown   Simvastatin    Unknown     Trazodone   And Nefazodone    Felt her throat was closing up/kept her awake   Sulfonamide Derivatives Rash, Other (See Comments)   blisters        Medication List        Accurate as of Jul 01, 2023 11:59 PM. If you have any questions, ask your nurse or doctor.          acetaminophen  325 MG tablet Commonly known as: TYLENOL  Take 650 mg by mouth every 4 (four) hours as needed for fever.  Give 650 mg by mouth at bedtime   albuterol  108 (90 Base) MCG/ACT inhaler Commonly known as: VENTOLIN  HFA Inhale into the lungs every 6 (six) hours as needed for wheezing or shortness of breath.   Albuterol -Budesonide 90-80 MCG/ACT Aero Inhale 2 puffs into the lungs every 6 (six) hours as needed (shortness of breath/ wheezing).   Antacid 750 MG chewable tablet Generic drug: calcium  carbonate Chew 1 tablet by mouth daily.   benzonatate  100 MG capsule Commonly known as: TESSALON  Take 1 capsule (100 mg total) by mouth 2 (two) times daily as needed for cough.   budesonide-formoterol 80-4.5 MCG/ACT inhaler Commonly known as: SYMBICORT Inhale 1 puff into the lungs daily.   budesonide-formoterol 160-4.5 MCG/ACT inhaler Commonly known as: SYMBICORT Inhale 2 puffs into the lungs daily.   cholecalciferol 1000 units tablet Commonly known as: VITAMIN D  Take 1,000 Units by mouth daily.   cyanocobalamin  1000 MCG tablet Commonly known as: VITAMIN B12 Take 1,000 mcg by mouth daily.   docusate sodium  100 MG capsule Commonly known as: COLACE Take 100 mg by mouth 2 (two) times daily.   doxycycline 100 MG tablet Commonly known as: VIBRA-TABS Take 100 mg by mouth every 12 (twelve) hours.   escitalopram 20 MG tablet Commonly known as: LEXAPRO Take 20 mg by mouth at bedtime.   feeding supplement (PRO-STAT 64) Liqd Take 30 mLs by mouth daily.   ferrous sulfate 325 (65 FE) MG tablet Take 325 mg by mouth. Once A Day on Mon, Wed, Fri   furosemide  40 MG tablet Commonly known as: LASIX  Take 40  mg by mouth 2 (two) times daily.   furosemide  40 MG tablet Commonly known as: LASIX  Take 1 tablet (40 mg total) by mouth daily for 2 days.   HYDROcodone -acetaminophen  5-325 MG tablet Commonly known as: NORCO/VICODIN Take 1 tablet by mouth every 6 (six) hours as needed for moderate pain (pain score 4-6).   hydrocortisone  cream 1 % Apply 1 application topically 2 (two) times daily as needed (wrists).   hydrocortisone  valerate cream 0.2 % Commonly known as: WESTCORT  Apply 1 application topically 2 (two) times daily as needed (for itching). Cleanse behind ears with gentle cleanser and dry completely.   ipratropium-albuterol  0.5-2.5 (3) MG/3ML Soln Commonly known as: DUONEB Take 3 mLs by nebulization in the morning and at bedtime. And 1 vial inhale orally every 24 hours as needed for Wheezing   ketoconazole 2 % cream Commonly known as: NIZORAL Apply 1 application  topically 2 (two) times daily as needed for irritation.   morphine CONCENTRATE 10 mg / 0.5 ml concentrated solution   nitrofurantoin 50 MG capsule Commonly known as: MACRODANTIN Take 50 mg by mouth at bedtime.   Olopatadine HCl 0.2 % Soln Place 1 drop into both eyes daily.   omeprazole  20 MG capsule Commonly known as: PRILOSEC Take 20 mg by mouth daily.   ondansetron  4 MG tablet Commonly known as: ZOFRAN  Take 4 mg by mouth every 8 (eight) hours  as needed for nausea or vomiting.   pantoprazole  40 MG tablet Commonly known as: PROTONIX  Take 40 mg by mouth daily.   polyethylene glycol 17 g packet Commonly known as: MIRALAX  / GLYCOLAX  Take 17 g by mouth 2 (two) times a week. Sunday and Wednesday   potassium chloride  SA 20 MEQ tablet Commonly known as: KLOR-CON  M Take 20 mEq by mouth 2 (two) times daily.   predniSONE  5 MG tablet Commonly known as: DELTASONE  Take 5 mg by mouth daily with breakfast.   pregabalin  75 MG capsule Commonly known as: LYRICA  Take 1 capsule (75 mg total) by mouth at bedtime.   Sarna  Sensitive 1 % Lotn Generic drug: pramoxine Apply to cheeks topically one time a day for rosy burning cheeks   Systane Complete 0.6 % Soln Generic drug: Propylene Glycol Instill 1 drop in both eyes four times a day for DRY EYE   Voltaren 1 % Gel Generic drug: diclofenac Sodium Apply 2 g topically every 8 (eight) hours as needed.   zolpidem  5 MG tablet Commonly known as: Ambien  Take 1 tablet (5 mg total) by mouth at bedtime.        Review of Systems  Constitutional:  Negative for appetite change, fatigue and fever.  HENT:  Positive for hearing loss. Negative for congestion and voice change.   Eyes:  Negative for visual disturbance.       C/o blurred vision, ? Color, ? Right eye peripheral vision loss-f/u Ophthalmology  Respiratory:  Positive for cough and shortness of breath. Negative for chest tightness and wheezing.        DOE, chronic hacking cough  Cardiovascular:  Positive for leg swelling. Negative for chest pain and palpitations.  Gastrointestinal:  Negative for abdominal pain and constipation.  Genitourinary:  Negative for dysuria, genital sores and urgency.       Chronic, on and off  Musculoskeletal:  Positive for arthralgias, back pain, gait problem and myalgias. Negative for joint swelling.  Skin:  Positive for wound.       Skin tears  Neurological:  Negative for speech difficulty, weakness and light-headedness.  Psychiatric/Behavioral:  Negative for behavioral problems, hallucinations and sleep disturbance.        Chronic going to bed late, sleeping in late.     Immunization History  Administered Date(s) Administered   Moderna SARS-COV2 Booster Vaccination 12/25/2019   Moderna Sars-Covid-2 Vaccination 02/17/2019, 03/17/2019   Pneumococcal Conjugate-13 09/01/2015   Pneumococcal Polysaccharide-23 07/07/1998   Td 08/21/2009   Tdap 09/22/2022   Pertinent  Health Maintenance Due  Topic Date Due   DEXA SCAN  Completed   INFLUENZA VACCINE  Discontinued       03/20/2021   12:00 AM 03/20/2021   12:00 PM 02/05/2022    9:16 AM 03/09/2022   10:01 AM 07/23/2022    3:44 PM  Fall Risk  Falls in the past year?   0 0 0  Was there an injury with Fall?   0 0 0  Fall Risk Category Calculator   0 0 0  Fall Risk Category (Retired)   Low    (RETIRED) Patient Fall Risk Level High fall risk High fall risk High fall risk    Patient at Risk for Falls Due to   History of fall(s) No Fall Risks No Fall Risks  Fall risk Follow up   Falls evaluation completed Falls evaluation completed Falls evaluation completed   Functional Status Survey:    Vitals:   07/01/23 1630  BP:  121/60  Pulse: (!) 59  Resp: 19  Temp: (!) 97.2 F (36.2 C)  SpO2: 99%  Weight: 154 lb 11.2 oz (70.2 kg)   Body mass index is 28.3 kg/m. Physical Exam Vitals and nursing note reviewed.  Constitutional:      Appearance: Normal appearance.  HENT:     Head: Normocephalic and atraumatic.     Nose: Nose normal.     Mouth/Throat:     Mouth: Mucous membranes are moist.  Eyes:     Extraocular Movements: Extraocular movements intact.     Conjunctiva/sclera: Conjunctivae normal.     Pupils: Pupils are equal, round, and reactive to light.     Comments: Able to count my fingers 3 feet away from her eyes.   Cardiovascular:     Rate and Rhythm: Normal rate and regular rhythm.     Heart sounds: No murmur heard. Pulmonary:     Effort: Pulmonary effort is normal.     Breath sounds: Rales present. No wheezing.     Comments: Decreased air entry to both lungs. Needs O2 via [place] to maintain SatO2>90, rales bibasilar.  Abdominal:     General: Bowel sounds are normal.     Palpations: Abdomen is soft.     Tenderness: There is no abdominal tenderness.  Musculoskeletal:        General: Tenderness present.     Cervical back: Normal range of motion and neck supple.     Right lower leg: Edema present.     Left lower leg: Edema present.     Comments: trace edema BLE. R shoulder pain with overhead ROM.   Left foot pain     Skin:    General: Skin is warm and dry.     Comments: RLE stasis ulcers x2, no s/s of infection.    Neurological:     General: No focal deficit present.     Mental Status: She is alert. Mental status is at baseline.     Motor: No weakness.     Coordination: Coordination normal.     Gait: Gait abnormal.     Comments: Oriented to person, place.   Psychiatric:        Mood and Affect: Mood normal.        Behavior: Behavior normal.     Labs reviewed: Recent Labs    07/31/22 1448 08/13/22 0000 08/24/22 0000 11/09/22 0000 01/20/23 0000  NA 137   < > 141 139 139  K 3.6   < > 4.1 3.7 3.6  CL 100   < > 103 98* 98*  CO2 27   < > 31* 32* 33*  GLUCOSE 112*  --   --   --   --   BUN 16   < > 18 20 19   CREATININE 1.25*   < > 1.1 1.1 1.1  CALCIUM  9.1   < > 8.8 9.0 9.1   < > = values in this interval not displayed.   Recent Labs    07/31/22 1448 01/20/23 0000  AST 20 12*  ALT 19 12  ALKPHOS 78 117  BILITOT 1.2  --   PROT 6.1*  --   ALBUMIN 3.6 3.7   Recent Labs    07/27/22 0000 07/31/22 1448 01/20/23 0000  WBC 8.1 7.0 7.5  NEUTROABS  --   --  5,588.00  HGB 12.3 13.3 10.5*  HCT 37 41.6 33*  MCV  --  95.2  --   PLT 194 179 218  Lab Results  Component Value Date   TSH 1.03 08/13/2022   Lab Results  Component Value Date   HGBA1C 6.6 08/13/2022   Lab Results  Component Value Date   CHOL 192 03/19/2021   HDL 69 03/19/2021   LDLCALC 107 (H) 03/19/2021   LDLDIRECT 114.7 08/18/2011   TRIG 78 03/19/2021   CHOLHDL 2.8 03/19/2021    Significant Diagnostic Results in last 30 days:  No results found.  Assessment/Plan  CKD (chronic kidney disease) stage 3, GFR 30-59 ml/min (HCC) Bun/creat 19/1.13 01/20/23  GERD GERD/chronic gastritis/atrophic gastritis, GI bleed 02/2020, s/p EGD, per biopsy, takes PPI,  F/u GI prn.   Osteoarthritis Chronic lower back, leg pain,  R shoulder pain, had Ortho eval, in setting of fibromyalgia, takes Lyrica   and Morphine  HTN (hypertension) loose control Bp, Sbp 140-150s intermittently, off  Metoprolol .   UTI (urinary tract infection)  Urinary symptoms, on  Nitrofurantoin 50mg  qd for UTI suppression, Dr. Ottelin  Chronic anemia Hx of Hgb 6.8 in hospital s/p 2 units of PRBC, on  Vit B12, Iron, Hgb 9.8 01/11/23<<10.5 01/20/23  Insomnia secondary to anxiety Insomnia, takes Ambien , Lexapro, Lorazepam.     Mild cognitive impairment  under Hospice service, resides in [facility] care.       Venous stasis ulcer (HCC) Venous insufficiency/Stasis ulcers 2x RLE, not infected. Calcium  alginate dressing for now.    Family/ staff Communication: plan of care reviewed with the patient and charge nurse.   Labs/tests ordered:  none

## 2023-07-04 ENCOUNTER — Encounter: Payer: Self-pay | Admitting: Nurse Practitioner

## 2023-07-05 DIAGNOSIS — I5020 Unspecified systolic (congestive) heart failure: Secondary | ICD-10-CM | POA: Diagnosis not present

## 2023-07-05 DIAGNOSIS — J449 Chronic obstructive pulmonary disease, unspecified: Secondary | ICD-10-CM | POA: Diagnosis not present

## 2023-07-05 DIAGNOSIS — N1831 Chronic kidney disease, stage 3a: Secondary | ICD-10-CM | POA: Diagnosis not present

## 2023-07-05 DIAGNOSIS — H900 Conductive hearing loss, bilateral: Secondary | ICD-10-CM | POA: Diagnosis not present

## 2023-07-05 DIAGNOSIS — F02A4 Dementia in other diseases classified elsewhere, mild, with anxiety: Secondary | ICD-10-CM | POA: Diagnosis not present

## 2023-07-05 DIAGNOSIS — J309 Allergic rhinitis, unspecified: Secondary | ICD-10-CM | POA: Diagnosis not present

## 2023-07-08 DIAGNOSIS — H900 Conductive hearing loss, bilateral: Secondary | ICD-10-CM | POA: Diagnosis not present

## 2023-07-08 DIAGNOSIS — J449 Chronic obstructive pulmonary disease, unspecified: Secondary | ICD-10-CM | POA: Diagnosis not present

## 2023-07-08 DIAGNOSIS — I5020 Unspecified systolic (congestive) heart failure: Secondary | ICD-10-CM | POA: Diagnosis not present

## 2023-07-08 DIAGNOSIS — J309 Allergic rhinitis, unspecified: Secondary | ICD-10-CM | POA: Diagnosis not present

## 2023-07-08 DIAGNOSIS — I83009 Varicose veins of unspecified lower extremity with ulcer of unspecified site: Secondary | ICD-10-CM | POA: Insufficient documentation

## 2023-07-08 DIAGNOSIS — F02A4 Dementia in other diseases classified elsewhere, mild, with anxiety: Secondary | ICD-10-CM | POA: Diagnosis not present

## 2023-07-08 DIAGNOSIS — N1831 Chronic kidney disease, stage 3a: Secondary | ICD-10-CM | POA: Diagnosis not present

## 2023-07-08 NOTE — Assessment & Plan Note (Signed)
 under Hospice service, resides in SNF [facility] for care.

## 2023-07-08 NOTE — Assessment & Plan Note (Signed)
 Insomnia, takes Ambien , Lexapro, Lorazepam.

## 2023-07-08 NOTE — Assessment & Plan Note (Signed)
 Venous insufficiency/Stasis ulcers 2x RLE, not infected. Calcium  alginate dressing for now.

## 2023-07-08 NOTE — Assessment & Plan Note (Signed)
 Hx of Hgb 6.8 in hospital s/p 2 units of PRBC, on  Vit B12, Iron, Hgb 9.8 01/11/23<<10.5 01/20/23

## 2023-07-08 NOTE — Assessment & Plan Note (Signed)
 Bun/creat 19/1.13 01/20/23

## 2023-07-08 NOTE — Assessment & Plan Note (Signed)
Urinary symptoms, on  Nitrofurantoin 50mg  qd for UTI suppression, Dr. Karsten Ro.

## 2023-07-08 NOTE — Assessment & Plan Note (Signed)
 GERD/chronic gastritis/atrophic gastritis, GI bleed 02/2020, s/p EGD, per biopsy, takes PPI,  F/u GI prn

## 2023-07-08 NOTE — Assessment & Plan Note (Signed)
 Chronic lower back, leg pain,  R shoulder pain, had Ortho eval, in setting of fibromyalgia, takes Lyrica  and Morphine

## 2023-07-08 NOTE — Assessment & Plan Note (Signed)
 loose control Bp, Sbp 140-150s intermittently, off  Metoprolol .

## 2023-07-09 DIAGNOSIS — J449 Chronic obstructive pulmonary disease, unspecified: Secondary | ICD-10-CM | POA: Diagnosis not present

## 2023-07-09 DIAGNOSIS — I5020 Unspecified systolic (congestive) heart failure: Secondary | ICD-10-CM | POA: Diagnosis not present

## 2023-07-09 DIAGNOSIS — J309 Allergic rhinitis, unspecified: Secondary | ICD-10-CM | POA: Diagnosis not present

## 2023-07-09 DIAGNOSIS — F02A4 Dementia in other diseases classified elsewhere, mild, with anxiety: Secondary | ICD-10-CM | POA: Diagnosis not present

## 2023-07-09 DIAGNOSIS — N1831 Chronic kidney disease, stage 3a: Secondary | ICD-10-CM | POA: Diagnosis not present

## 2023-07-09 DIAGNOSIS — H900 Conductive hearing loss, bilateral: Secondary | ICD-10-CM | POA: Diagnosis not present

## 2023-07-12 DIAGNOSIS — I5020 Unspecified systolic (congestive) heart failure: Secondary | ICD-10-CM | POA: Diagnosis not present

## 2023-07-12 DIAGNOSIS — N1831 Chronic kidney disease, stage 3a: Secondary | ICD-10-CM | POA: Diagnosis not present

## 2023-07-12 DIAGNOSIS — J449 Chronic obstructive pulmonary disease, unspecified: Secondary | ICD-10-CM | POA: Diagnosis not present

## 2023-07-12 DIAGNOSIS — H900 Conductive hearing loss, bilateral: Secondary | ICD-10-CM | POA: Diagnosis not present

## 2023-07-12 DIAGNOSIS — F02A4 Dementia in other diseases classified elsewhere, mild, with anxiety: Secondary | ICD-10-CM | POA: Diagnosis not present

## 2023-07-12 DIAGNOSIS — J309 Allergic rhinitis, unspecified: Secondary | ICD-10-CM | POA: Diagnosis not present

## 2023-07-12 IMAGING — CT CT HEAD W/O CM
4 of 8 series · 16 of 47 positions shown, 17 images · non-contrast
Comparison: None.

CLINICAL DATA: Follow-up stroke



[Series 2: head wo · axial · 0.49mm/px · z∈[+1432,+1537]mm · 4 of 35 slices shown, 5 images]
[im 7/35  brain]
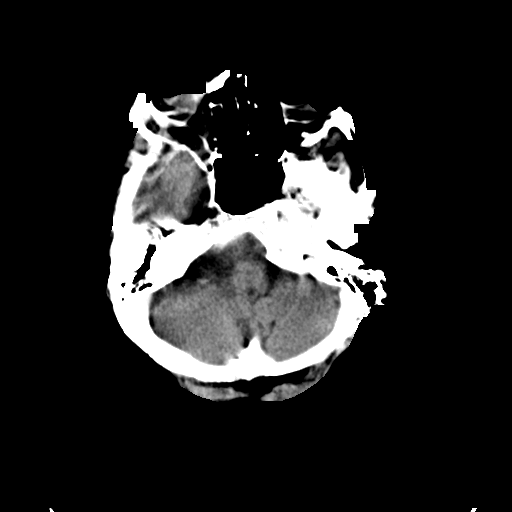
[im 7/35  bone]
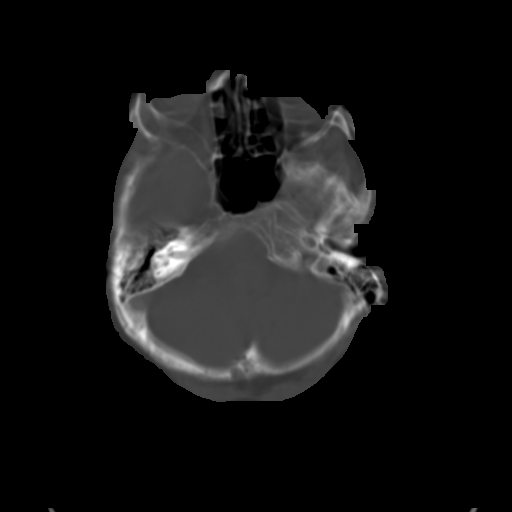
[im 14/35  brain]
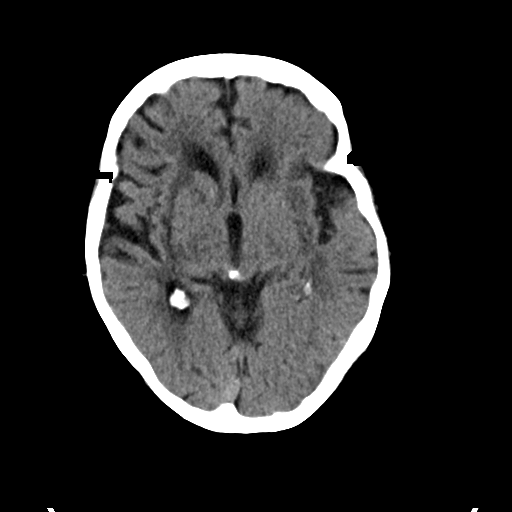
[im 21/35  brain]
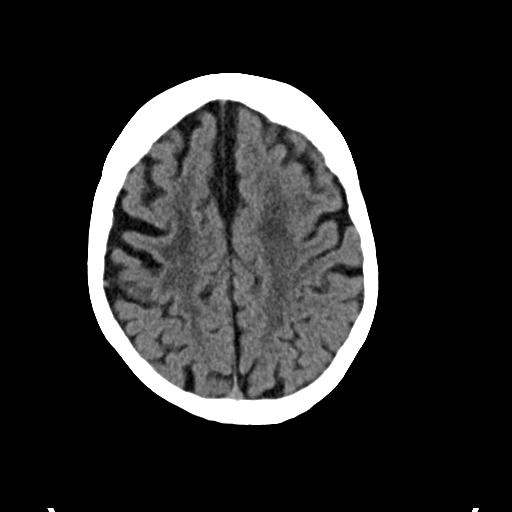
[im 28/35  brain]
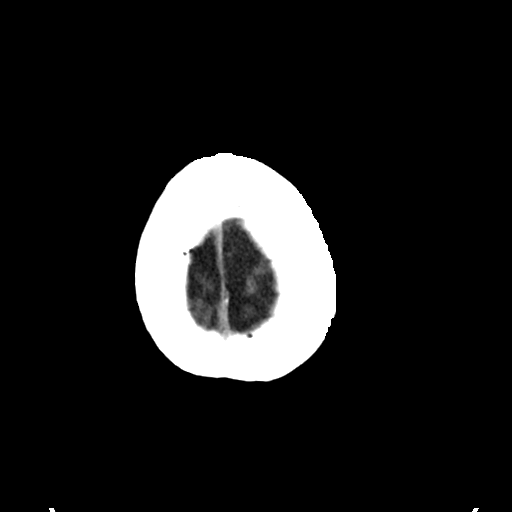

[Series 4: head bone · axial · 0.49mm/px · z∈[+1414,+1512]mm · 6 of 88 slices shown]
[im 7/88  bone]
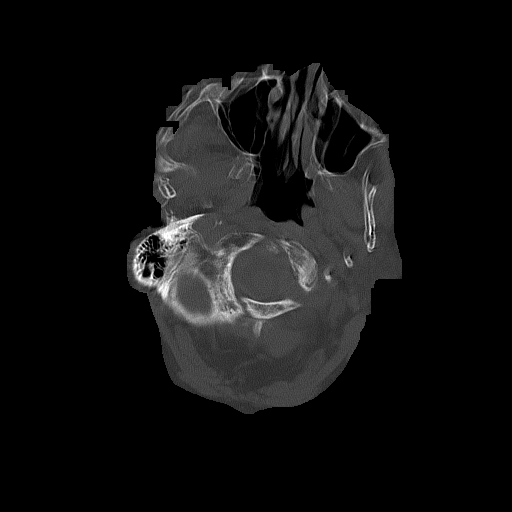
[im 19/88  bone]
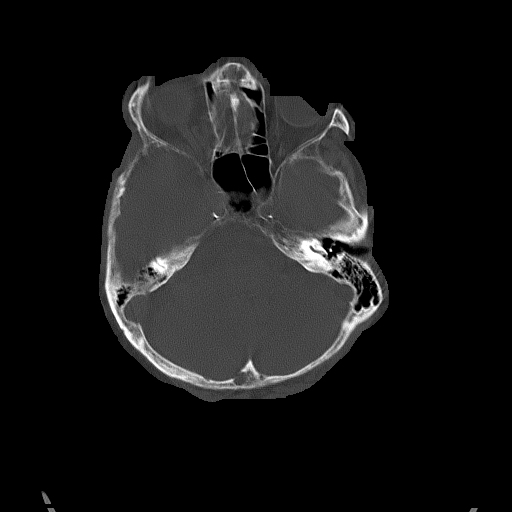
[im 32/88  bone]
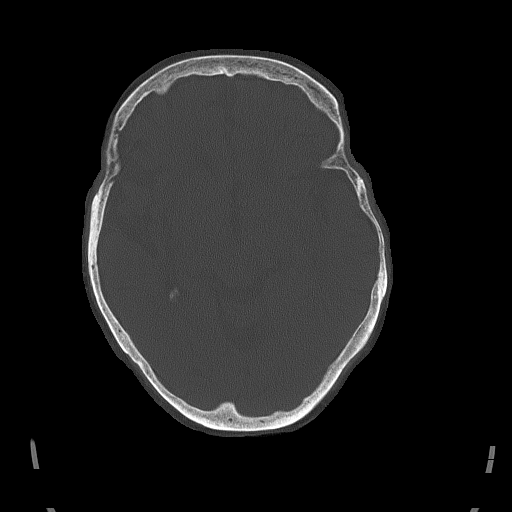
[im 38/88  bone]
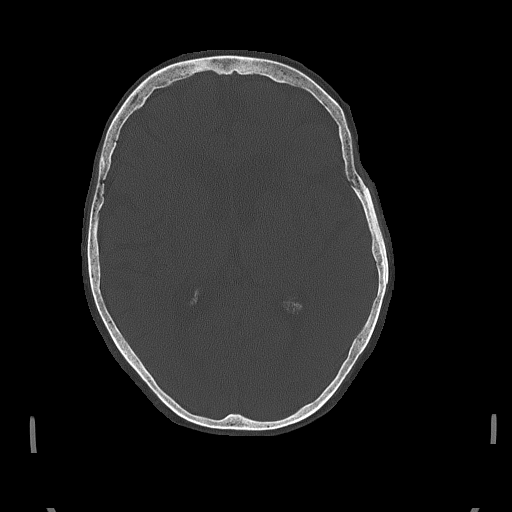
[im 50/88  bone]
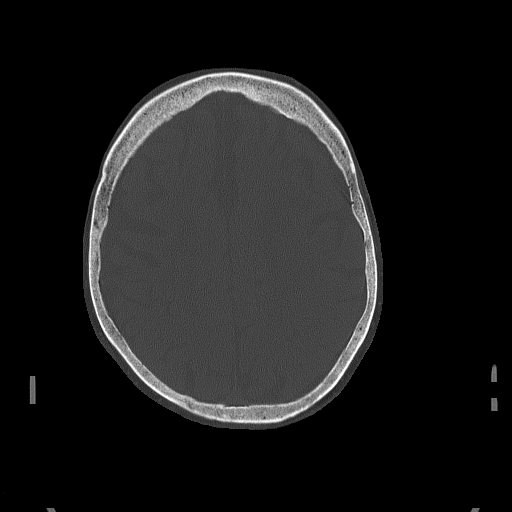
[im 56/88  bone]
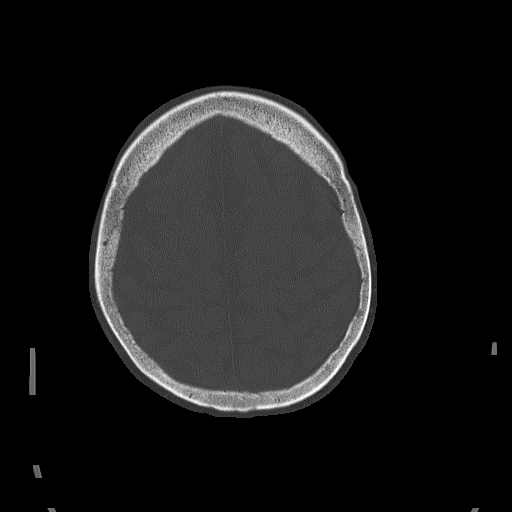

[Series 6: coronal soft tissue · coronal · 0.32mm/px · 3 of 67 slices shown]
[im 17/67  brain]
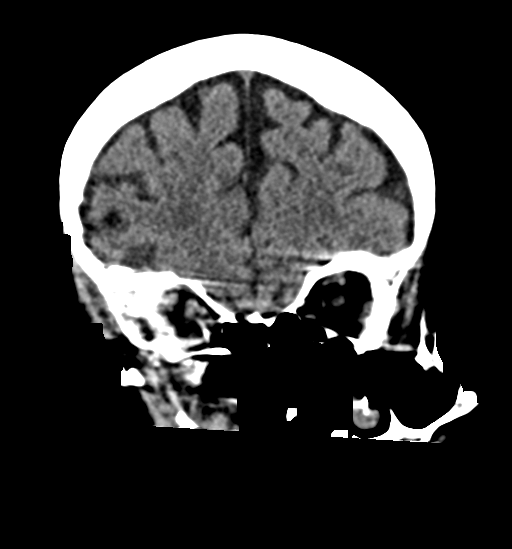
[im 34/67  brain]
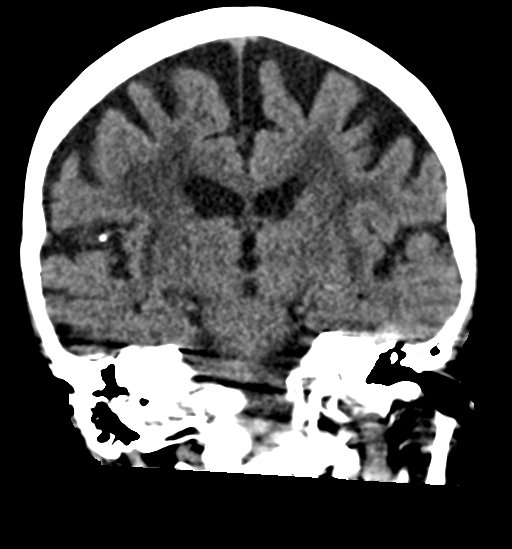
[im 50/67  brain]
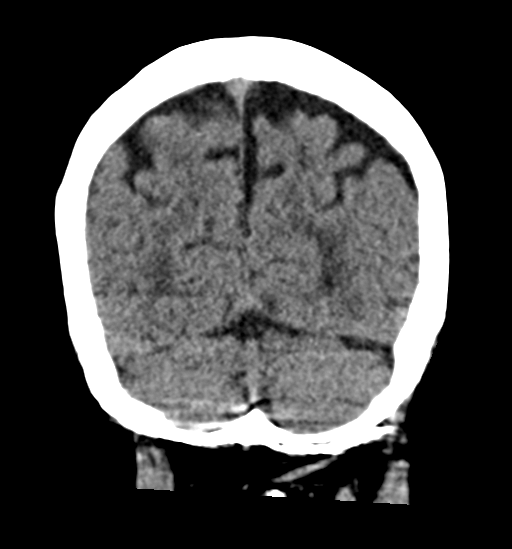

[Series 9: sagittal soft tissue · sagittal · 0.27mm/px · 3 of 63 slices shown]
[im 13/63  brain]
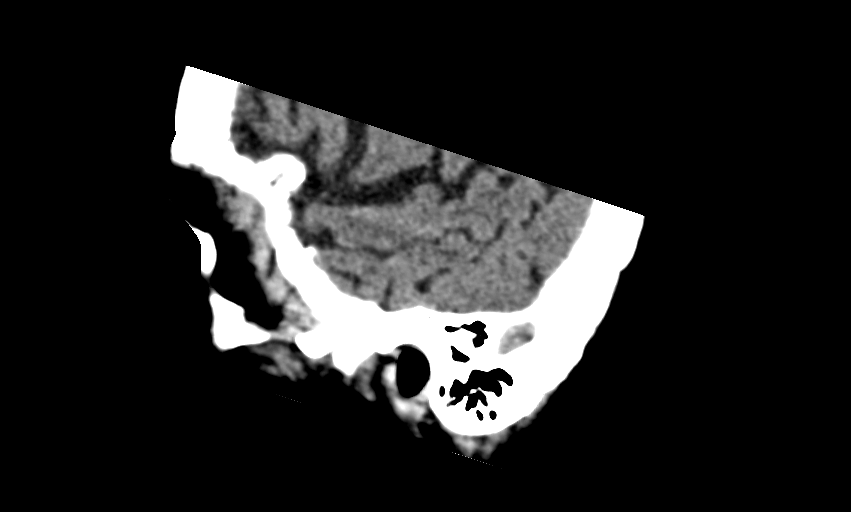
[im 25/63  brain]
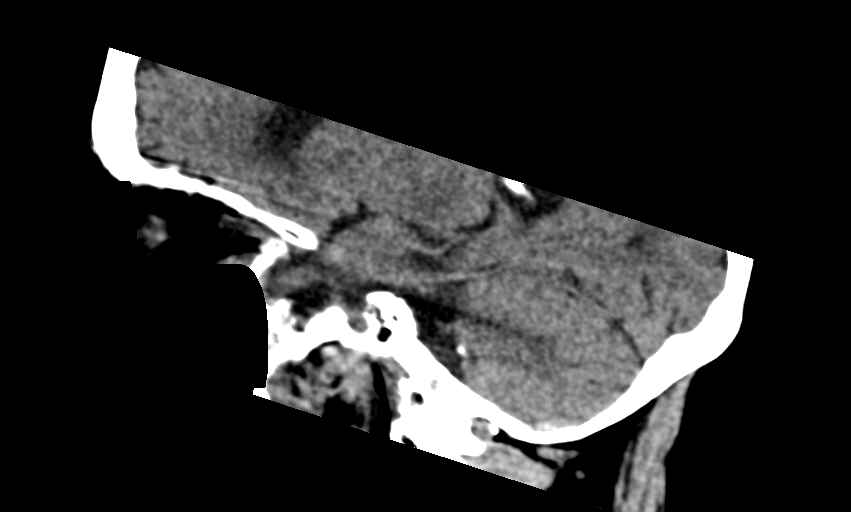
[im 38/63  brain]
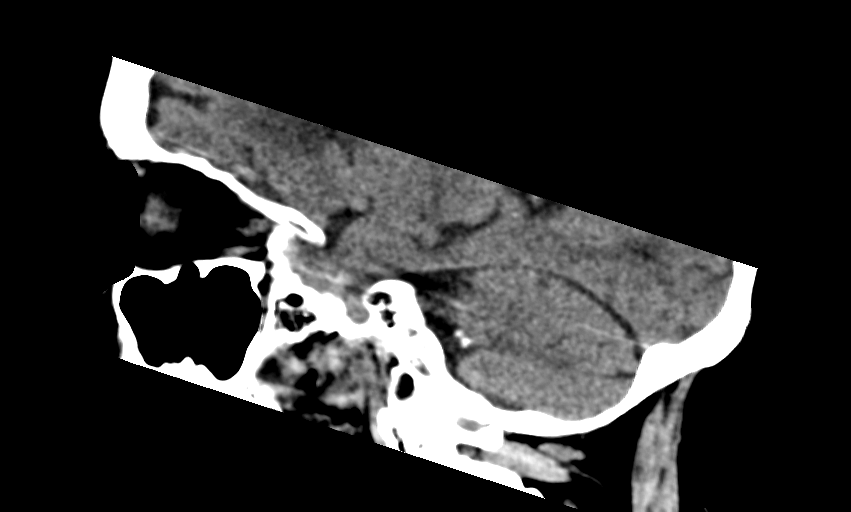

[16 of 47 positions shown; findings below may reference images not displayed]

FINDINGS: Brain: No evidence of acute infarction, hemorrhage, hydrocephalus,
extra-axial collection or mass lesion/mass effect. Unchanged
extensive periventricular and deep white matter hypodensity with
mild global cerebral volume loss. Focal hypodensity of the right
corona radiata is unchanged, consistent with a small, strictly age
indeterminate, although likely chronic lacunar infarction (series 2,
image 18).

Vascular: No hyperdense vessel or unexpected calcification.

Skull: Normal. Negative for fracture or focal lesion.

Sinuses/Orbits: No acute finding.

Other: None.
IMPRESSION: 1. No definite acute intracranial pathology. Focal hypodensity of
the right corona radiata is unchanged, consistent with a small,
strictly age indeterminate, although likely chronic lacunar
infarction.
2. Unchanged extensive periventricular and deep white matter
hypodensity with mild global cerebral volume loss, in keeping with
advanced small vessel white matter disease.

## 2023-07-13 DIAGNOSIS — F02A4 Dementia in other diseases classified elsewhere, mild, with anxiety: Secondary | ICD-10-CM | POA: Diagnosis not present

## 2023-07-13 DIAGNOSIS — J309 Allergic rhinitis, unspecified: Secondary | ICD-10-CM | POA: Diagnosis not present

## 2023-07-13 DIAGNOSIS — I5020 Unspecified systolic (congestive) heart failure: Secondary | ICD-10-CM | POA: Diagnosis not present

## 2023-07-13 DIAGNOSIS — H900 Conductive hearing loss, bilateral: Secondary | ICD-10-CM | POA: Diagnosis not present

## 2023-07-13 DIAGNOSIS — N1831 Chronic kidney disease, stage 3a: Secondary | ICD-10-CM | POA: Diagnosis not present

## 2023-07-13 DIAGNOSIS — J449 Chronic obstructive pulmonary disease, unspecified: Secondary | ICD-10-CM | POA: Diagnosis not present

## 2023-07-14 DIAGNOSIS — N1831 Chronic kidney disease, stage 3a: Secondary | ICD-10-CM | POA: Diagnosis not present

## 2023-07-14 DIAGNOSIS — J309 Allergic rhinitis, unspecified: Secondary | ICD-10-CM | POA: Diagnosis not present

## 2023-07-14 DIAGNOSIS — H900 Conductive hearing loss, bilateral: Secondary | ICD-10-CM | POA: Diagnosis not present

## 2023-07-14 DIAGNOSIS — F02A4 Dementia in other diseases classified elsewhere, mild, with anxiety: Secondary | ICD-10-CM | POA: Diagnosis not present

## 2023-07-14 DIAGNOSIS — J449 Chronic obstructive pulmonary disease, unspecified: Secondary | ICD-10-CM | POA: Diagnosis not present

## 2023-07-14 DIAGNOSIS — I5020 Unspecified systolic (congestive) heart failure: Secondary | ICD-10-CM | POA: Diagnosis not present

## 2023-07-15 DIAGNOSIS — I5020 Unspecified systolic (congestive) heart failure: Secondary | ICD-10-CM | POA: Diagnosis not present

## 2023-07-15 DIAGNOSIS — J449 Chronic obstructive pulmonary disease, unspecified: Secondary | ICD-10-CM | POA: Diagnosis not present

## 2023-07-15 DIAGNOSIS — N1831 Chronic kidney disease, stage 3a: Secondary | ICD-10-CM | POA: Diagnosis not present

## 2023-07-15 DIAGNOSIS — H900 Conductive hearing loss, bilateral: Secondary | ICD-10-CM | POA: Diagnosis not present

## 2023-07-15 DIAGNOSIS — J309 Allergic rhinitis, unspecified: Secondary | ICD-10-CM | POA: Diagnosis not present

## 2023-07-15 DIAGNOSIS — F02A4 Dementia in other diseases classified elsewhere, mild, with anxiety: Secondary | ICD-10-CM | POA: Diagnosis not present

## 2023-07-16 DIAGNOSIS — J449 Chronic obstructive pulmonary disease, unspecified: Secondary | ICD-10-CM | POA: Diagnosis not present

## 2023-07-16 DIAGNOSIS — F02A4 Dementia in other diseases classified elsewhere, mild, with anxiety: Secondary | ICD-10-CM | POA: Diagnosis not present

## 2023-07-16 DIAGNOSIS — H900 Conductive hearing loss, bilateral: Secondary | ICD-10-CM | POA: Diagnosis not present

## 2023-07-16 DIAGNOSIS — N1831 Chronic kidney disease, stage 3a: Secondary | ICD-10-CM | POA: Diagnosis not present

## 2023-07-16 DIAGNOSIS — J309 Allergic rhinitis, unspecified: Secondary | ICD-10-CM | POA: Diagnosis not present

## 2023-07-16 DIAGNOSIS — I5020 Unspecified systolic (congestive) heart failure: Secondary | ICD-10-CM | POA: Diagnosis not present

## 2023-07-17 DIAGNOSIS — J449 Chronic obstructive pulmonary disease, unspecified: Secondary | ICD-10-CM | POA: Diagnosis not present

## 2023-07-17 DIAGNOSIS — H900 Conductive hearing loss, bilateral: Secondary | ICD-10-CM | POA: Diagnosis not present

## 2023-07-17 DIAGNOSIS — N1831 Chronic kidney disease, stage 3a: Secondary | ICD-10-CM | POA: Diagnosis not present

## 2023-07-17 DIAGNOSIS — G4700 Insomnia, unspecified: Secondary | ICD-10-CM | POA: Diagnosis not present

## 2023-07-17 DIAGNOSIS — R12 Heartburn: Secondary | ICD-10-CM | POA: Diagnosis not present

## 2023-07-17 DIAGNOSIS — J42 Unspecified chronic bronchitis: Secondary | ICD-10-CM | POA: Diagnosis not present

## 2023-07-17 DIAGNOSIS — J309 Allergic rhinitis, unspecified: Secondary | ICD-10-CM | POA: Diagnosis not present

## 2023-07-17 DIAGNOSIS — I5020 Unspecified systolic (congestive) heart failure: Secondary | ICD-10-CM | POA: Diagnosis not present

## 2023-07-17 DIAGNOSIS — F02A4 Dementia in other diseases classified elsewhere, mild, with anxiety: Secondary | ICD-10-CM | POA: Diagnosis not present

## 2023-07-17 DIAGNOSIS — K219 Gastro-esophageal reflux disease without esophagitis: Secondary | ICD-10-CM | POA: Diagnosis not present

## 2023-07-17 DIAGNOSIS — F32A Depression, unspecified: Secondary | ICD-10-CM | POA: Diagnosis not present

## 2023-07-17 DIAGNOSIS — D649 Anemia, unspecified: Secondary | ICD-10-CM | POA: Diagnosis not present

## 2023-07-18 ENCOUNTER — Encounter: Payer: Self-pay | Admitting: Nurse Practitioner

## 2023-07-18 ENCOUNTER — Non-Acute Institutional Stay (SKILLED_NURSING_FACILITY): Payer: Self-pay | Admitting: Nurse Practitioner

## 2023-07-18 DIAGNOSIS — K219 Gastro-esophageal reflux disease without esophagitis: Secondary | ICD-10-CM

## 2023-07-18 DIAGNOSIS — I10 Essential (primary) hypertension: Secondary | ICD-10-CM

## 2023-07-18 DIAGNOSIS — I509 Heart failure, unspecified: Secondary | ICD-10-CM

## 2023-07-18 DIAGNOSIS — M159 Polyosteoarthritis, unspecified: Secondary | ICD-10-CM | POA: Diagnosis not present

## 2023-07-18 DIAGNOSIS — J4489 Other specified chronic obstructive pulmonary disease: Secondary | ICD-10-CM | POA: Diagnosis not present

## 2023-07-18 DIAGNOSIS — F419 Anxiety disorder, unspecified: Secondary | ICD-10-CM

## 2023-07-18 DIAGNOSIS — I63411 Cerebral infarction due to embolism of right middle cerebral artery: Secondary | ICD-10-CM

## 2023-07-18 DIAGNOSIS — F5105 Insomnia due to other mental disorder: Secondary | ICD-10-CM | POA: Diagnosis not present

## 2023-07-18 DIAGNOSIS — R419 Unspecified symptoms and signs involving cognitive functions and awareness: Secondary | ICD-10-CM

## 2023-07-18 NOTE — Assessment & Plan Note (Signed)
 Chronic O2 [place] use Bronchodilators and low dose of Prednisone 

## 2023-07-18 NOTE — Assessment & Plan Note (Signed)
 takes Ambien , Lexapro, Lorazepam.

## 2023-07-18 NOTE — Assessment & Plan Note (Signed)
 GERD/chronic gastritis/atrophic gastritis, GI bleed 02/2020, s/p EGD, per biopsy, takes PPI,  F/u GI prn

## 2023-07-18 NOTE — Assessment & Plan Note (Signed)
 under Hospice service, resides in SNF [facility] for care.

## 2023-07-18 NOTE — Assessment & Plan Note (Signed)
 loose control Bp, Sbp 140-150s intermittently, off  Metoprolol .

## 2023-07-18 NOTE — Progress Notes (Signed)
 Location:   SNF [facility] Room Number: 60A Place of Service:  SNF (31) Provider: Abner Hoffman Cheyene Hamric NP  Kaiesha Tonner X, NP  Patient Care Team: Brittanya Winburn X, NP as PCP - General (Internal Medicine) Daliya Parchment X, NP as Nurse Practitioner (Internal Medicine)  Extended Emergency Contact Information Primary Emergency Contact: Gertz,Jeff Home Phone: [phone] Relation: Son Secondary Emergency Contact: Culbreth Sr.,Christopher Home Phone: [phone] Relation: Relative  Code Status:  DNR Goals of care: Advanced Directive information    06/17/2023    1:05 PM  Advanced Directives  Does Patient Have a Medical Advance Directive? Yes  Type of Advance Directive Living will;Out of facility DNR (pink MOST or yellow form)  Does patient want to make changes to medical advance directive? No - Patient declined  Pre-existing out of facility DNR order (yellow form or pink MOST form) Yellow form placed in chart (order not valid for inpatient use)     Chief Complaint  Patient presents with   Medical Management of Chronic Issues    HPI:  Pt is a 89 y.o. female seen today for medical management of chronic diseases.     Venous insufficiency/Stasis ulcers 2x RLE, not infected.             Gait abnormality, walker for ambulation,  risk falling             CHF clinically presumed. 08/16/22 Echo mild aortic stenosis, normal left ventricular systolic function, no EF.  BNP in 364 08/12/22>>248 09/21/22, chronic edema BLE, on Furosemide . (CXR showed central pulmonary venous congestion w/o frank pulmonary edema, chronic lung disease, no acute findings of osseous structures)             COPD, intermittent chronic mild expiratory wheezes, c/o SOB, uses O2,  takes Benzonatate , DuoNeb, prn Albuterol /Budsonide, cough is chronic, on Prednisone  low dose. treated with higher dose of Prednisone  for symptomatic control.  Hx of CVA, CT x2 03/2021 showed no acute findings except age indeterminate right corona radiator  infarct, small vessel changes. Neurology recommended no further work ups.              Macular degeneration, f/u [facility]: OU for [place] MD involving central area. OS exudative age related MD             Chronic lower back, leg pain,  R shoulder pain, had Ortho eval, takes Lyrica  and Morphine             HTN, loose control Bp, Sbp 140-150s intermittently, off  Metoprolol .              Urinary symptoms, on  Nitrofurantoin 50mg  qd for UTI suppression, Dr. Ottelin             Anemia, Hx of Hgb 6.8 in hospital s/p 2 units of PRBC, on  Vit B12, Iron, Hgb 9.8 01/11/23<<10.5 01/20/23             CKD Bun/creat 19/1.13 01/20/23             GERD/chronic gastritis/atrophic gastritis, GI bleed 02/2020, s/p EGD, per biopsy, takes PPI,  F/u GI prn.  Fibromyalgia takes  lyrica , Morphine.              Tactile hallucinations, Hx of, off meds. TSH 1.03              Constipation, takes MiraLax ,  Colace               Prediabetes, Hgb a1c 6.6 08/12/22  Hypokalemia, K 3.9 01/11/23             Insomnia, takes Ambien , Lexapro, Lorazepam.            [facility], under Hospice service, resides in [facility] for care.           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     Fibromyalgia    GERD 12/19/2006   Qualifier: Diagnosis of  By: Avis Boehringer CMA, Cleave Curling  04/13/16 TSH 1.20, Na  141, K 4.2, Bun 15, creat 0.79, wbc 5.5, Hgb 11.5, plt 283    GERD (gastroesophageal reflux disease)    Hammer toe of second toe of left foot 04/08/2016   Left 2nd   Hearing loss    Hemorrhoids    Hypercholesterolemia    Hypertension    Osteoarthritis 12/19/2006   Qualifier: Diagnosis of  By: Avis Boehringer CMA, Leigh     Osteoporosis    Past Surgical History:  Procedure Laterality Date   BIOPSY  02/17/2020   Procedure: BIOPSY;  Surgeon: Lajuan Pila, MD;  Location: [facility];  Service: Endoscopy;;   CATARACT EXTRACTION, BILATERAL  2009   COLONOSCOPY  2009, 2006 , 2017   ESOPHAGOGASTRODUODENOSCOPY (EGD) WITH PROPOFOL  N/A 02/17/2020   Procedure: ESOPHAGOGASTRODUODENOSCOPY (EGD) WITH PROPOFOL ;  Surgeon: Lajuan Pila, MD;  Location: [facility];  Service: Endoscopy;  Laterality: N/A;   IR ANGIOGRAM SELECTIVE EACH ADDITIONAL VESSEL  02/17/2020   IR ANGIOGRAM SELECTIVE EACH ADDITIONAL VESSEL  02/17/2020   IR ANGIOGRAM SELECTIVE EACH ADDITIONAL VESSEL  02/17/2020   IR ANGIOGRAM SELECTIVE EACH ADDITIONAL VESSEL  02/17/2020   IR ANGIOGRAM SELECTIVE EACH ADDITIONAL VESSEL  02/17/2020   IR ANGIOGRAM VISCERAL SELECTIVE  02/17/2020   IR ANGIOGRAM VISCERAL SELECTIVE  02/17/2020   IR US  GUIDE VASC ACCESS RIGHT  02/17/2020   stapes surgery     Dr Jodelle Mungo   TONSILLECTOMY AND ADENOIDECTOMY     as a child    Allergies  Allergen Reactions   Penicillins Anaphylaxis   Bisphosphonates Other (See Comments)    [name]   Influenza Vaccines     Unknown   Simvastatin     Unknown    Trazodone  And Nefazodone     Felt her throat was closing up/kept her awake   Sulfonamide Derivatives Rash and Other (See Comments)    blisters    Allergies as of 07/18/2023       Reactions   Penicillins Anaphylaxis   Bisphosphonates Other (See Comments)   [name]   Influenza Vaccines    Unknown   Simvastatin    Unknown    Trazodone  And Nefazodone    Felt her throat was closing up/kept her awake   Sulfonamide  Derivatives Rash, Other (See Comments)   blisters        Medication List        Accurate as of July 18, 2023 11:59 PM. If you have any questions, ask your nurse or doctor.          acetaminophen  325 MG tablet Commonly known as: TYLENOL  Take 650 mg by mouth every 4 (four) hours as needed for fever.  Give 650 mg by mouth at bedtime   albuterol  108 (90 Base) MCG/ACT inhaler Commonly known as: VENTOLIN  HFA Inhale into the lungs every 6 (six) hours as needed for wheezing or shortness of breath.   Albuterol -Budesonide 90-80 MCG/ACT Aero Inhale 2 puffs into the lungs every 6 (six) hours as needed (shortness of breath/ wheezing).   Antacid 750 MG chewable tablet Generic drug: calcium  carbonate Chew 1 tablet by mouth daily.   benzonatate  100 MG capsule Commonly known as: TESSALON  Take 1 capsule (100 mg total) by mouth 2 (two) times daily as needed for cough.   budesonide-formoterol 80-4.5 MCG/ACT inhaler Commonly known as: SYMBICORT Inhale 1 puff into the lungs daily.   budesonide-formoterol 160-4.5 MCG/ACT inhaler Commonly known as: SYMBICORT Inhale 2 puffs into the lungs daily.   cholecalciferol 1000 units tablet Commonly known as: VITAMIN D  Take 1,000 Units by mouth daily.   cyanocobalamin  1000 MCG tablet Commonly known as: VITAMIN B12 Take 1,000 mcg by mouth daily.   docusate sodium  100 MG capsule Commonly known as: COLACE Take 100 mg by mouth 2 (two) times daily.   doxycycline 100 MG tablet Commonly known as: VIBRA-TABS Take 100 mg by mouth every 12 (twelve) hours.   escitalopram 20 MG tablet Commonly known as: LEXAPRO Take 20 mg by mouth at bedtime.   feeding supplement (PRO-STAT 64) Liqd Take 30 mLs by mouth daily.   ferrous sulfate 325 (65 FE) MG tablet Take 325 mg by mouth. Once A Day on Mon, Wed, Fri   furosemide  40 MG tablet Commonly known as: LASIX  Take 40 mg by mouth 2 (two) times daily.   furosemide  40 MG tablet Commonly known as:  LASIX  Take 1 tablet (40 mg total) by mouth daily for 2 days.   HYDROcodone -acetaminophen  5-325 MG tablet Commonly known as: NORCO/VICODIN Take 1 tablet by mouth every 6 (six) hours as needed for moderate pain (pain score 4-6).   hydrocortisone  cream 1 % Apply 1 application topically 2 (two) times daily as needed (wrists).   hydrocortisone  valerate cream 0.2 % Commonly known as: WESTCORT  Apply 1 application topically 2 (two) times daily as needed (for itching). Cleanse behind ears with gentle cleanser and dry completely.   ipratropium-albuterol  0.5-2.5 (3) MG/3ML Soln Commonly known as: DUONEB Take 3 mLs by nebulization in the morning and at bedtime. And 1 vial inhale orally every 24 hours as needed for Wheezing   ketoconazole 2 % cream Commonly known as: NIZORAL Apply 1 application  topically 2 (two) times daily as needed for irritation.   morphine CONCENTRATE 10 mg / 0.5 ml concentrated solution   nitrofurantoin 50 MG capsule Commonly known as: MACRODANTIN Take 50 mg by mouth at bedtime.   Olopatadine HCl 0.2 % Soln Place 1 drop into both eyes daily.   omeprazole  20 MG capsule Commonly known as: PRILOSEC Take 20 mg by mouth daily.   ondansetron  4 MG tablet Commonly known as: ZOFRAN  Take 4 mg by mouth every 8 (eight) hours as needed for nausea or vomiting.   pantoprazole  40 MG tablet Commonly known as: PROTONIX   Take 40 mg by mouth daily.   polyethylene glycol 17 g packet Commonly known as: MIRALAX  / GLYCOLAX  Take 17 g by mouth 2 (two) times a week. Sunday and Wednesday   potassium chloride  SA 20 MEQ tablet Commonly known as: KLOR-CON  M Take 20 mEq by mouth 2 (two) times daily.   predniSONE  5 MG tablet Commonly known as: DELTASONE  Take 5 mg by mouth daily with breakfast.   pregabalin  75 MG capsule Commonly known as: LYRICA  Take 1 capsule (75 mg total) by mouth at bedtime.   Sarna Sensitive 1 % Lotn Generic drug: pramoxine Apply to cheeks topically one time  a day for rosy burning cheeks   Systane Complete 0.6 % Soln Generic drug: Propylene Glycol Instill 1 drop in both eyes four times a day for DRY EYE   Voltaren 1 % Gel Generic drug: diclofenac Sodium Apply 2 g topically every 8 (eight) hours as needed.   zolpidem  5 MG tablet Commonly known as: Ambien  Take 1 tablet (5 mg total) by mouth at bedtime.        Review of Systems  Constitutional:  Negative for appetite change, fatigue and fever.  HENT:  Positive for hearing loss. Negative for congestion and voice change.   Eyes:  Negative for visual disturbance.       C/o blurred vision, ? Color, ? Right eye peripheral vision loss-f/u Ophthalmology  Respiratory:  Positive for cough and shortness of breath. Negative for chest tightness and wheezing.        DOE, chronic hacking cough  Cardiovascular:  Positive for leg swelling. Negative for chest pain and palpitations.  Gastrointestinal:  Negative for abdominal pain and constipation.  Genitourinary:  Negative for dysuria, genital sores and urgency.       Chronic, on and off  Musculoskeletal:  Positive for arthralgias, back pain, gait problem and myalgias. Negative for joint swelling.  Skin:  Positive for wound.       Skin tears  Neurological:  Negative for speech difficulty, weakness and light-headedness.  Psychiatric/Behavioral:  Negative for behavioral problems, hallucinations and sleep disturbance.        Chronic going to bed late, sleeping in late.     Immunization History  Administered Date(s) Administered   Moderna SARS-COV2 Booster Vaccination 12/25/2019   Moderna Sars-Covid-2 Vaccination 02/17/2019, 03/17/2019   Pneumococcal Conjugate-13 09/01/2015   Pneumococcal Polysaccharide-23 07/07/1998   Td 08/21/2009   Tdap 09/22/2022   Pertinent  Health Maintenance Due  Topic Date Due   DEXA SCAN  Completed   INFLUENZA VACCINE  Discontinued      03/20/2021   12:00 AM 03/20/2021   12:00 PM 02/05/2022    9:16 AM 03/09/2022    10:01 AM 07/23/2022    3:44 PM  Fall Risk  Falls in the past year?   0 0 0  Was there an injury with Fall?   0 0 0  Fall Risk Category Calculator   0 0 0  Fall Risk Category (Retired)   Low    (RETIRED) Patient Fall Risk Level High fall risk High fall risk High fall risk    Patient at Risk for Falls Due to   History of fall(s) No Fall Risks No Fall Risks  Fall risk Follow up   Falls evaluation completed Falls evaluation completed Falls evaluation completed   Functional Status Survey:    Vitals:   07/18/23 1202 07/19/23 1038  BP: (!) 143/62 (!) 143/62  Pulse: 76   Resp: 18   Temp: (!)  97.2 F (36.2 C)   SpO2: 92%   Weight: 152 lb 3.2 oz (69 kg)    Body mass index is 27.84 kg/m. Physical Exam Vitals and nursing note reviewed.  Constitutional:      Appearance: Normal appearance.  HENT:     Head: Normocephalic and atraumatic.     Nose: Nose normal.     Mouth/Throat:     Mouth: Mucous membranes are moist.  Eyes:     Extraocular Movements: Extraocular movements intact.     Conjunctiva/sclera: Conjunctivae normal.     Pupils: Pupils are equal, round, and reactive to light.     Comments: Able to count my fingers 3 feet away from her eyes.   Cardiovascular:     Rate and Rhythm: Normal rate and regular rhythm.     Heart sounds: No murmur heard. Pulmonary:     Effort: Pulmonary effort is normal.     Breath sounds: Rales present. No wheezing.     Comments: Decreased air entry to both lungs. Needs O2 via [place] to maintain SatO2>90, rales bibasilar.  Abdominal:     General: Bowel sounds are normal.     Palpations: Abdomen is soft.     Tenderness: There is no abdominal tenderness.  Musculoskeletal:        General: Tenderness present.     Cervical back: Normal range of motion and neck supple.     Right lower leg: Edema present.     Left lower leg: Edema present.     Comments: 1-2+ edema BLE. R shoulder pain with overhead ROM.       Skin:    General: Skin is warm and dry.      Comments: RLE stasis ulcers x2, no s/s of infection.    Neurological:     General: No focal deficit present.     Mental Status: She is alert. Mental status is at baseline.     Motor: No weakness.     Coordination: Coordination normal.     Gait: Gait abnormal.     Comments: Oriented to person, place.   Psychiatric:        Mood and Affect: Mood normal.        Behavior: Behavior normal.     Labs reviewed: Recent Labs    07/31/22 1448 08/13/22 0000 08/24/22 0000 11/09/22 0000 01/20/23 0000  NA 137   < > 141 139 139  K 3.6   < > 4.1 3.7 3.6  CL 100   < > 103 98* 98*  CO2 27   < > 31* 32* 33*  GLUCOSE 112*  --   --   --   --   BUN 16   < > 18 20 19   CREATININE 1.25*   < > 1.1 1.1 1.1  CALCIUM  9.1   < > 8.8 9.0 9.1   < > = values in this interval not displayed.   Recent Labs    07/31/22 1448 01/20/23 0000  AST 20 12*  ALT 19 12  ALKPHOS 78 117  BILITOT 1.2  --   PROT 6.1*  --   ALBUMIN 3.6 3.7   Recent Labs    07/27/22 0000 07/31/22 1448 01/20/23 0000  WBC 8.1 7.0 7.5  NEUTROABS  --   --  5,588.00  HGB 12.3 13.3 10.5*  HCT 37 41.6 33*  MCV  --  95.2  --   PLT 194 179 218   Lab Results  Component Value Date  TSH 1.03 08/13/2022   Lab Results  Component Value Date   HGBA1C 6.6 08/13/2022   Lab Results  Component Value Date   CHOL 192 03/19/2021   HDL 69 03/19/2021   LDLCALC 107 (H) 03/19/2021   LDLDIRECT 114.7 08/18/2011   TRIG 78 03/19/2021   CHOLHDL 2.8 03/19/2021    Significant Diagnostic Results in last 30 days:  No results found.  Assessment/Plan  Congestive heart failure (CHF) (HCC) Chronic edema BLE Continue Furosemide  and Kcl  COPD (chronic obstructive pulmonary disease) with chronic bronchitis Chronic O2 [place] use Bronchodilators and low dose of Prednisone   CVA (cerebral vascular accident) (HCC) Hx of CVA, no focal weakness residual, Neurology recommended no further work ups.  Osteoarthritis Chronic lower back, leg pain,  R  shoulder pain, had Ortho eval, takes Lyrica  and Morphine  HTN (hypertension)  loose control Bp, Sbp 140-150s intermittently, off  Metoprolol .   GERD GERD/chronic gastritis/atrophic gastritis, GI bleed 02/2020, s/p EGD, per biopsy, takes PPI,  F/u GI prn.   Insomnia secondary to anxiety  takes Ambien , Lexapro, Lorazepam.    Neurocognitive disorder under Hospice service, resides in SNF [facility] for care.        Family/ staff Communication: plan of care reviewed with the patient and charge nurse.   Labs/tests ordered:  none

## 2023-07-18 NOTE — Assessment & Plan Note (Signed)
 Hx of CVA, no focal weakness residual, Neurology recommended no further work ups.

## 2023-07-18 NOTE — Assessment & Plan Note (Signed)
 Chronic edema BLE Continue Furosemide  and Kcl

## 2023-07-18 NOTE — Assessment & Plan Note (Signed)
 Chronic lower back, leg pain,  R shoulder pain, had Ortho eval, takes Lyrica  and Morphine

## 2023-07-19 DIAGNOSIS — F02A4 Dementia in other diseases classified elsewhere, mild, with anxiety: Secondary | ICD-10-CM | POA: Diagnosis not present

## 2023-07-19 DIAGNOSIS — H900 Conductive hearing loss, bilateral: Secondary | ICD-10-CM | POA: Diagnosis not present

## 2023-07-19 DIAGNOSIS — J309 Allergic rhinitis, unspecified: Secondary | ICD-10-CM | POA: Diagnosis not present

## 2023-07-19 DIAGNOSIS — N1831 Chronic kidney disease, stage 3a: Secondary | ICD-10-CM | POA: Diagnosis not present

## 2023-07-19 DIAGNOSIS — I5020 Unspecified systolic (congestive) heart failure: Secondary | ICD-10-CM | POA: Diagnosis not present

## 2023-07-19 DIAGNOSIS — J449 Chronic obstructive pulmonary disease, unspecified: Secondary | ICD-10-CM | POA: Diagnosis not present

## 2023-07-21 DIAGNOSIS — J449 Chronic obstructive pulmonary disease, unspecified: Secondary | ICD-10-CM | POA: Diagnosis not present

## 2023-07-21 DIAGNOSIS — J309 Allergic rhinitis, unspecified: Secondary | ICD-10-CM | POA: Diagnosis not present

## 2023-07-21 DIAGNOSIS — H900 Conductive hearing loss, bilateral: Secondary | ICD-10-CM | POA: Diagnosis not present

## 2023-07-21 DIAGNOSIS — N1831 Chronic kidney disease, stage 3a: Secondary | ICD-10-CM | POA: Diagnosis not present

## 2023-07-21 DIAGNOSIS — F02A4 Dementia in other diseases classified elsewhere, mild, with anxiety: Secondary | ICD-10-CM | POA: Diagnosis not present

## 2023-07-21 DIAGNOSIS — I5020 Unspecified systolic (congestive) heart failure: Secondary | ICD-10-CM | POA: Diagnosis not present

## 2023-07-22 ENCOUNTER — Telehealth: Payer: Self-pay

## 2023-07-22 DIAGNOSIS — I5020 Unspecified systolic (congestive) heart failure: Secondary | ICD-10-CM | POA: Diagnosis not present

## 2023-07-22 DIAGNOSIS — N1831 Chronic kidney disease, stage 3a: Secondary | ICD-10-CM | POA: Diagnosis not present

## 2023-07-22 DIAGNOSIS — J309 Allergic rhinitis, unspecified: Secondary | ICD-10-CM | POA: Diagnosis not present

## 2023-07-22 DIAGNOSIS — J449 Chronic obstructive pulmonary disease, unspecified: Secondary | ICD-10-CM | POA: Diagnosis not present

## 2023-07-22 DIAGNOSIS — H900 Conductive hearing loss, bilateral: Secondary | ICD-10-CM | POA: Diagnosis not present

## 2023-07-22 DIAGNOSIS — F02A4 Dementia in other diseases classified elsewhere, mild, with anxiety: Secondary | ICD-10-CM | POA: Diagnosis not present

## 2023-07-22 NOTE — Telephone Encounter (Signed)
 Copied from CRM [id number]. Topic: Clinical - Medication Question >> Jul 22, 2023  2:27 PM Retta Caster wrote: Reason for CRM: furosemide  (LASIX ) 40 MG tablet-Theresa from [facility] needs call back on clarification on medication due to there is 2 on file. If patient is taken both to only on of the medications. Phone: [phone] Fax: [phone]

## 2023-07-22 NOTE — Telephone Encounter (Signed)
 This is a skilled nursing facility patient. Calls should be redirected to the facility to have the nursing staff address. I am forwarding to the [facility] team

## 2023-07-26 DIAGNOSIS — F02A4 Dementia in other diseases classified elsewhere, mild, with anxiety: Secondary | ICD-10-CM | POA: Diagnosis not present

## 2023-07-26 DIAGNOSIS — N1831 Chronic kidney disease, stage 3a: Secondary | ICD-10-CM | POA: Diagnosis not present

## 2023-07-26 DIAGNOSIS — J449 Chronic obstructive pulmonary disease, unspecified: Secondary | ICD-10-CM | POA: Diagnosis not present

## 2023-07-26 DIAGNOSIS — H900 Conductive hearing loss, bilateral: Secondary | ICD-10-CM | POA: Diagnosis not present

## 2023-07-26 DIAGNOSIS — J309 Allergic rhinitis, unspecified: Secondary | ICD-10-CM | POA: Diagnosis not present

## 2023-07-26 DIAGNOSIS — I5020 Unspecified systolic (congestive) heart failure: Secondary | ICD-10-CM | POA: Diagnosis not present

## 2023-07-29 DIAGNOSIS — F02A4 Dementia in other diseases classified elsewhere, mild, with anxiety: Secondary | ICD-10-CM | POA: Diagnosis not present

## 2023-07-29 DIAGNOSIS — N1831 Chronic kidney disease, stage 3a: Secondary | ICD-10-CM | POA: Diagnosis not present

## 2023-07-29 DIAGNOSIS — H900 Conductive hearing loss, bilateral: Secondary | ICD-10-CM | POA: Diagnosis not present

## 2023-07-29 DIAGNOSIS — J449 Chronic obstructive pulmonary disease, unspecified: Secondary | ICD-10-CM | POA: Diagnosis not present

## 2023-07-29 DIAGNOSIS — I5020 Unspecified systolic (congestive) heart failure: Secondary | ICD-10-CM | POA: Diagnosis not present

## 2023-07-29 DIAGNOSIS — J309 Allergic rhinitis, unspecified: Secondary | ICD-10-CM | POA: Diagnosis not present

## 2023-08-02 DIAGNOSIS — J449 Chronic obstructive pulmonary disease, unspecified: Secondary | ICD-10-CM | POA: Diagnosis not present

## 2023-08-02 DIAGNOSIS — I5020 Unspecified systolic (congestive) heart failure: Secondary | ICD-10-CM | POA: Diagnosis not present

## 2023-08-02 DIAGNOSIS — F02A4 Dementia in other diseases classified elsewhere, mild, with anxiety: Secondary | ICD-10-CM | POA: Diagnosis not present

## 2023-08-02 DIAGNOSIS — J309 Allergic rhinitis, unspecified: Secondary | ICD-10-CM | POA: Diagnosis not present

## 2023-08-02 DIAGNOSIS — H900 Conductive hearing loss, bilateral: Secondary | ICD-10-CM | POA: Diagnosis not present

## 2023-08-02 DIAGNOSIS — N1831 Chronic kidney disease, stage 3a: Secondary | ICD-10-CM | POA: Diagnosis not present

## 2023-08-05 DIAGNOSIS — I5020 Unspecified systolic (congestive) heart failure: Secondary | ICD-10-CM | POA: Diagnosis not present

## 2023-08-05 DIAGNOSIS — N1831 Chronic kidney disease, stage 3a: Secondary | ICD-10-CM | POA: Diagnosis not present

## 2023-08-05 DIAGNOSIS — J309 Allergic rhinitis, unspecified: Secondary | ICD-10-CM | POA: Diagnosis not present

## 2023-08-05 DIAGNOSIS — J449 Chronic obstructive pulmonary disease, unspecified: Secondary | ICD-10-CM | POA: Diagnosis not present

## 2023-08-05 DIAGNOSIS — F02A4 Dementia in other diseases classified elsewhere, mild, with anxiety: Secondary | ICD-10-CM | POA: Diagnosis not present

## 2023-08-05 DIAGNOSIS — H900 Conductive hearing loss, bilateral: Secondary | ICD-10-CM | POA: Diagnosis not present

## 2023-08-09 DIAGNOSIS — J309 Allergic rhinitis, unspecified: Secondary | ICD-10-CM | POA: Diagnosis not present

## 2023-08-09 DIAGNOSIS — J449 Chronic obstructive pulmonary disease, unspecified: Secondary | ICD-10-CM | POA: Diagnosis not present

## 2023-08-09 DIAGNOSIS — F02A4 Dementia in other diseases classified elsewhere, mild, with anxiety: Secondary | ICD-10-CM | POA: Diagnosis not present

## 2023-08-09 DIAGNOSIS — H900 Conductive hearing loss, bilateral: Secondary | ICD-10-CM | POA: Diagnosis not present

## 2023-08-09 DIAGNOSIS — N1831 Chronic kidney disease, stage 3a: Secondary | ICD-10-CM | POA: Diagnosis not present

## 2023-08-09 DIAGNOSIS — I5020 Unspecified systolic (congestive) heart failure: Secondary | ICD-10-CM | POA: Diagnosis not present

## 2023-08-12 DIAGNOSIS — J309 Allergic rhinitis, unspecified: Secondary | ICD-10-CM | POA: Diagnosis not present

## 2023-08-12 DIAGNOSIS — H900 Conductive hearing loss, bilateral: Secondary | ICD-10-CM | POA: Diagnosis not present

## 2023-08-12 DIAGNOSIS — N1831 Chronic kidney disease, stage 3a: Secondary | ICD-10-CM | POA: Diagnosis not present

## 2023-08-12 DIAGNOSIS — I5020 Unspecified systolic (congestive) heart failure: Secondary | ICD-10-CM | POA: Diagnosis not present

## 2023-08-12 DIAGNOSIS — F02A4 Dementia in other diseases classified elsewhere, mild, with anxiety: Secondary | ICD-10-CM | POA: Diagnosis not present

## 2023-08-12 DIAGNOSIS — J449 Chronic obstructive pulmonary disease, unspecified: Secondary | ICD-10-CM | POA: Diagnosis not present

## 2023-08-13 DIAGNOSIS — N1831 Chronic kidney disease, stage 3a: Secondary | ICD-10-CM | POA: Diagnosis not present

## 2023-08-13 DIAGNOSIS — F02A4 Dementia in other diseases classified elsewhere, mild, with anxiety: Secondary | ICD-10-CM | POA: Diagnosis not present

## 2023-08-13 DIAGNOSIS — H900 Conductive hearing loss, bilateral: Secondary | ICD-10-CM | POA: Diagnosis not present

## 2023-08-13 DIAGNOSIS — J309 Allergic rhinitis, unspecified: Secondary | ICD-10-CM | POA: Diagnosis not present

## 2023-08-13 DIAGNOSIS — I5020 Unspecified systolic (congestive) heart failure: Secondary | ICD-10-CM | POA: Diagnosis not present

## 2023-08-13 DIAGNOSIS — J449 Chronic obstructive pulmonary disease, unspecified: Secondary | ICD-10-CM | POA: Diagnosis not present

## 2023-08-16 DIAGNOSIS — K219 Gastro-esophageal reflux disease without esophagitis: Secondary | ICD-10-CM | POA: Diagnosis not present

## 2023-08-16 DIAGNOSIS — D649 Anemia, unspecified: Secondary | ICD-10-CM | POA: Diagnosis not present

## 2023-08-16 DIAGNOSIS — N1831 Chronic kidney disease, stage 3a: Secondary | ICD-10-CM | POA: Diagnosis not present

## 2023-08-16 DIAGNOSIS — F32A Depression, unspecified: Secondary | ICD-10-CM | POA: Diagnosis not present

## 2023-08-16 DIAGNOSIS — J309 Allergic rhinitis, unspecified: Secondary | ICD-10-CM | POA: Diagnosis not present

## 2023-08-16 DIAGNOSIS — R12 Heartburn: Secondary | ICD-10-CM | POA: Diagnosis not present

## 2023-08-16 DIAGNOSIS — F02A4 Dementia in other diseases classified elsewhere, mild, with anxiety: Secondary | ICD-10-CM | POA: Diagnosis not present

## 2023-08-16 DIAGNOSIS — I5020 Unspecified systolic (congestive) heart failure: Secondary | ICD-10-CM | POA: Diagnosis not present

## 2023-08-16 DIAGNOSIS — J449 Chronic obstructive pulmonary disease, unspecified: Secondary | ICD-10-CM | POA: Diagnosis not present

## 2023-08-16 DIAGNOSIS — G4700 Insomnia, unspecified: Secondary | ICD-10-CM | POA: Diagnosis not present

## 2023-08-16 DIAGNOSIS — J42 Unspecified chronic bronchitis: Secondary | ICD-10-CM | POA: Diagnosis not present

## 2023-08-16 DIAGNOSIS — H900 Conductive hearing loss, bilateral: Secondary | ICD-10-CM | POA: Diagnosis not present

## 2023-08-18 DIAGNOSIS — F02A4 Dementia in other diseases classified elsewhere, mild, with anxiety: Secondary | ICD-10-CM | POA: Diagnosis not present

## 2023-08-18 DIAGNOSIS — J309 Allergic rhinitis, unspecified: Secondary | ICD-10-CM | POA: Diagnosis not present

## 2023-08-18 DIAGNOSIS — H900 Conductive hearing loss, bilateral: Secondary | ICD-10-CM | POA: Diagnosis not present

## 2023-08-18 DIAGNOSIS — J449 Chronic obstructive pulmonary disease, unspecified: Secondary | ICD-10-CM | POA: Diagnosis not present

## 2023-08-18 DIAGNOSIS — I5020 Unspecified systolic (congestive) heart failure: Secondary | ICD-10-CM | POA: Diagnosis not present

## 2023-08-18 DIAGNOSIS — N1831 Chronic kidney disease, stage 3a: Secondary | ICD-10-CM | POA: Diagnosis not present

## 2023-08-23 DIAGNOSIS — H900 Conductive hearing loss, bilateral: Secondary | ICD-10-CM | POA: Diagnosis not present

## 2023-08-23 DIAGNOSIS — J449 Chronic obstructive pulmonary disease, unspecified: Secondary | ICD-10-CM | POA: Diagnosis not present

## 2023-08-23 DIAGNOSIS — J309 Allergic rhinitis, unspecified: Secondary | ICD-10-CM | POA: Diagnosis not present

## 2023-08-23 DIAGNOSIS — I5020 Unspecified systolic (congestive) heart failure: Secondary | ICD-10-CM | POA: Diagnosis not present

## 2023-08-23 DIAGNOSIS — N1831 Chronic kidney disease, stage 3a: Secondary | ICD-10-CM | POA: Diagnosis not present

## 2023-08-23 DIAGNOSIS — F02A4 Dementia in other diseases classified elsewhere, mild, with anxiety: Secondary | ICD-10-CM | POA: Diagnosis not present

## 2023-08-24 ENCOUNTER — Other Ambulatory Visit: Payer: Self-pay | Admitting: Orthopedic Surgery

## 2023-08-24 DIAGNOSIS — N1831 Chronic kidney disease, stage 3a: Secondary | ICD-10-CM | POA: Diagnosis not present

## 2023-08-24 DIAGNOSIS — H900 Conductive hearing loss, bilateral: Secondary | ICD-10-CM | POA: Diagnosis not present

## 2023-08-24 DIAGNOSIS — J449 Chronic obstructive pulmonary disease, unspecified: Secondary | ICD-10-CM | POA: Diagnosis not present

## 2023-08-24 DIAGNOSIS — J309 Allergic rhinitis, unspecified: Secondary | ICD-10-CM | POA: Diagnosis not present

## 2023-08-24 DIAGNOSIS — I5020 Unspecified systolic (congestive) heart failure: Secondary | ICD-10-CM | POA: Diagnosis not present

## 2023-08-24 DIAGNOSIS — F02A4 Dementia in other diseases classified elsewhere, mild, with anxiety: Secondary | ICD-10-CM | POA: Diagnosis not present

## 2023-08-24 MED ORDER — LORAZEPAM 0.5 MG PO TABS: 0.5000 mg | ORAL_TABLET | ORAL | 1 refills | Status: DC | PRN

## 2023-08-25 ENCOUNTER — Encounter: Payer: Self-pay | Admitting: Nurse Practitioner

## 2023-08-25 ENCOUNTER — Non-Acute Institutional Stay (SKILLED_NURSING_FACILITY): Payer: Self-pay | Admitting: Nurse Practitioner

## 2023-08-25 DIAGNOSIS — H900 Conductive hearing loss, bilateral: Secondary | ICD-10-CM | POA: Diagnosis not present

## 2023-08-25 DIAGNOSIS — I5020 Unspecified systolic (congestive) heart failure: Secondary | ICD-10-CM | POA: Diagnosis not present

## 2023-08-25 DIAGNOSIS — F02A4 Dementia in other diseases classified elsewhere, mild, with anxiety: Secondary | ICD-10-CM | POA: Diagnosis not present

## 2023-08-25 DIAGNOSIS — J9621 Acute and chronic respiratory failure with hypoxia: Secondary | ICD-10-CM | POA: Diagnosis not present

## 2023-08-25 DIAGNOSIS — N1831 Chronic kidney disease, stage 3a: Secondary | ICD-10-CM | POA: Diagnosis not present

## 2023-08-25 DIAGNOSIS — J9691 Respiratory failure, unspecified with hypoxia: Secondary | ICD-10-CM | POA: Insufficient documentation

## 2023-08-25 DIAGNOSIS — J309 Allergic rhinitis, unspecified: Secondary | ICD-10-CM | POA: Diagnosis not present

## 2023-08-25 DIAGNOSIS — I509 Heart failure, unspecified: Secondary | ICD-10-CM

## 2023-08-25 DIAGNOSIS — I10 Essential (primary) hypertension: Secondary | ICD-10-CM | POA: Diagnosis not present

## 2023-08-25 DIAGNOSIS — Z66 Do not resuscitate: Secondary | ICD-10-CM | POA: Diagnosis not present

## 2023-08-25 DIAGNOSIS — J4489 Other specified chronic obstructive pulmonary disease: Secondary | ICD-10-CM | POA: Diagnosis not present

## 2023-08-25 DIAGNOSIS — M797 Fibromyalgia: Secondary | ICD-10-CM

## 2023-08-25 DIAGNOSIS — J449 Chronic obstructive pulmonary disease, unspecified: Secondary | ICD-10-CM | POA: Diagnosis not present

## 2023-08-25 NOTE — Assessment & Plan Note (Signed)
 not controlled Bp, comfort measure.

## 2023-08-25 NOTE — Assessment & Plan Note (Signed)
 No apparent dependent edema.

## 2023-08-25 NOTE — Assessment & Plan Note (Signed)
 acute respiratory distress-labored breathing, tachypnea, O2 desaturation regardless O2 2-3lmp via [place]. The patient appears restless, unable to take po meds or food. Her goal of care is comfort measure only.  Will have Morphine, Lorazepam  available to her for comfort measures.

## 2023-08-25 NOTE — Progress Notes (Signed)
 Location:  [facility] Room Number: [phone] A Place of Service:  SNF (31) Provider:  Ereka Brau X, NP  Patient Care Team: Maddalynn Barnard X, NP as PCP - General (Internal Medicine) Cammie Faulstich X, NP as Nurse Practitioner (Internal Medicine)  Extended Emergency Contact Information Primary Emergency Contact: Nabi,Jeff Home Phone: [phone] Relation: Son Secondary Emergency Contact: Culbreth Sr.,Christopher Home Phone: [phone] Relation: Relative  Code Status:  DNR Goals of care: Advanced Directive information    06/17/2023    1:05 PM  Advanced Directives  Does Patient Have a Medical Advance Directive? Yes  Type of Advance Directive Living will;Out of facility DNR (pink MOST or yellow form)  Does patient want to make changes to medical advance directive? No - Patient declined  Pre-existing out of facility DNR order (yellow form or pink MOST form) Yellow form placed in chart (order not valid for inpatient use)     Chief Complaint  Patient presents with   Respiratory Distress    HPI:  Pt is a 89 y.o. female seen today for an acute visit for acute respiratory distress-labored breathing, tachypnea, O2 desaturation regardless O2 2-3lmp via [place]. The patient appears restless, unable to take po meds or food. Her goal of care is comfort measure only.   CHF, no apparent edema  COPD chronic, SOB, wheezing  HTN, not controlled Bp  Fibromyalgia, chronic pain.      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     Fibromyalgia    GERD 12/19/2006   Qualifier: Diagnosis of  By: Latisha CMA, Anette  04/13/16 TSH 1.20, Na 141, K 4.2, Bun 15, creat 0.79, wbc 5.5, Hgb 11.5, plt 283    GERD (gastroesophageal reflux disease)    Hammer toe of second toe of left foot 04/08/2016   Left 2nd   Hearing loss    Hemorrhoids    Hypercholesterolemia    Hypertension    Osteoarthritis 12/19/2006   Qualifier: Diagnosis of  By: Latisha CMA, Leigh     Osteoporosis    Past Surgical History:  Procedure Laterality Date   BIOPSY  02/17/2020   Procedure: BIOPSY;  Surgeon: Charlanne Groom, MD;  Location: [facility];  Service: Endoscopy;;   CATARACT EXTRACTION, BILATERAL  2009   COLONOSCOPY  2009, 2006 , 2017   ESOPHAGOGASTRODUODENOSCOPY (EGD) WITH PROPOFOL  N/A 02/17/2020   Procedure: ESOPHAGOGASTRODUODENOSCOPY (EGD) WITH PROPOFOL ;  Surgeon: Charlanne Groom, MD;  Location: [facility];  Service: Endoscopy;  Laterality: N/A;   IR ANGIOGRAM SELECTIVE EACH ADDITIONAL VESSEL  02/17/2020   IR ANGIOGRAM SELECTIVE EACH ADDITIONAL VESSEL  02/17/2020   IR ANGIOGRAM SELECTIVE EACH ADDITIONAL VESSEL  02/17/2020   IR ANGIOGRAM SELECTIVE EACH ADDITIONAL VESSEL  02/17/2020  IR ANGIOGRAM SELECTIVE EACH ADDITIONAL VESSEL  02/17/2020   IR ANGIOGRAM VISCERAL SELECTIVE  02/17/2020   IR ANGIOGRAM VISCERAL SELECTIVE  02/17/2020   IR US  GUIDE VASC ACCESS RIGHT  02/17/2020   stapes surgery     Dr Thaddeus   TONSILLECTOMY AND ADENOIDECTOMY     as a child    Allergies  Allergen Reactions   Penicillins Anaphylaxis   Bisphosphonates Other (See Comments)    [name]   Influenza Vaccines     Unknown   Simvastatin     Unknown    Trazodone  And Nefazodone     Felt her throat was closing up/kept her awake   Sulfonamide Derivatives Rash and Other (See Comments)    blisters     Outpatient Encounter Medications as of 08/25/2023  Medication Sig   acetaminophen  (TYLENOL ) 500 MG tablet Take 500 mg by mouth 3 (three) times daily.   Amino Acids-Protein Hydrolys (FEEDING SUPPLEMENT, PRO-STAT 64,) LIQD Take 30 mLs by mouth daily.   benzonatate  (TESSALON ) 100 MG capsule Take 1 capsule (100 mg total) by mouth 2 (two) times daily as needed for cough.   budesonide-formoterol (SYMBICORT) 160-4.5 MCG/ACT inhaler Inhale 2 puffs into the lungs daily.   calcium  carbonate (ANTACID) 750 MG chewable tablet Chew 1 tablet by mouth daily.   cholecalciferol (VITAMIN D ) 1000 UNITS tablet Take 1,000 Units by mouth daily.   diclofenac Sodium (VOLTAREN) 1 % GEL Apply 2 g topically every 8 (eight) hours as needed.   docusate sodium  (COLACE) 100 MG capsule Take 100 mg by mouth 2 (two) times daily.   escitalopram (LEXAPRO) 20 MG tablet Take 20 mg by mouth at bedtime.   ferrous sulfate 325 (65 FE) MG tablet Take 325 mg by mouth. Once A Day on Mon, Wed, Fri   furosemide  (LASIX ) 20 MG tablet Take 20 mg by mouth as directed. Every 24 hours as needed for SOB and/or increased fluid retention related to COPD   furosemide  (LASIX ) 40 MG tablet Take 40 mg by mouth 2 (two) times daily.   ipratropium-albuterol  (DUONEB) 0.5-2.5 (3) MG/3ML SOLN Take 3 mLs by nebulization in the morning and at bedtime. And 1 vial inhale orally every 24 hours as needed for Wheezing   ketoconazole (NIZORAL) 2 % cream Apply 1 application  topically 2 (two) times daily as needed for irritation.   LORazepam  (ATIVAN ) 0.5 MG tablet Take 1 tablet (0.5 mg total) by mouth every 4 (four) hours as needed for anxiety.   Morphine Sulfate (MORPHINE CONCENTRATE) 10 mg / 0.5 ml concentrated solution Give 0.5 ml by mouth every 4 hours for pain related to non pressure chronic ulcer of other part of right lower leg limited breakdown of skin. Also give 0.5 ml by mouth every 2 hours as needed for pain unspecified or SOB   nitrofurantoin (MACRODANTIN)  50 MG capsule Take 50 mg by mouth at bedtime.   Olopatadine HCl 0.2 % SOLN Place 1 drop into both eyes daily.   ondansetron  (ZOFRAN ) 4 MG tablet Take 4 mg by mouth every 8 (eight) hours as needed for nausea or vomiting.   OXYGEN Inhale 3 L into the lungs as directed. Every shift related to COPD   pantoprazole  (PROTONIX ) 40 MG tablet Take 40 mg by mouth daily.   polyethylene glycol (MIRALAX  / GLYCOLAX ) 17 g packet Take 17 g by mouth daily.   potassium chloride  SA (KLOR-CON  M) 20 MEQ tablet Take 20 mEq by mouth 2 (two) times daily.   pramoxine (  SARNA SENSITIVE) 1 % LOTN Apply to cheeks topically one time a day for rosy burning cheeks   predniSONE  (DELTASONE ) 5 MG tablet Take 5 mg by mouth daily with breakfast.   pregabalin  (LYRICA ) 75 MG capsule Take 1 capsule (75 mg total) by mouth at bedtime.   Propylene Glycol (SYSTANE COMPLETE) 0.6 % SOLN Instill 1 drop in both eyes four times a day for DRY EYE   vitamin B-12 (CYANOCOBALAMIN ) 1000 MCG tablet Take 1,000 mcg by mouth every Monday, Wednesday, and Friday.   zolpidem  (AMBIEN ) 5 MG tablet Take 1 tablet (5 mg total) by mouth at bedtime.   albuterol  (VENTOLIN  HFA) 108 (90 Base) MCG/ACT inhaler Inhale into the lungs every 6 (six) hours as needed for wheezing or shortness of breath. (Patient not taking: Reported on 08/25/2023)   Albuterol -Budesonide 90-80 MCG/ACT AERO Inhale 2 puffs into the lungs every 6 (six) hours as needed (shortness of breath/ wheezing). (Patient not taking: Reported on 08/25/2023)   budesonide-formoterol (SYMBICORT) 80-4.5 MCG/ACT inhaler Inhale 1 puff into the lungs daily. (Patient not taking: Reported on 08/25/2023)   doxycycline (VIBRA-TABS) 100 MG tablet Take 100 mg by mouth every 12 (twelve) hours. (Patient not taking: Reported on 08/25/2023)   furosemide  (LASIX ) 40 MG tablet Take 1 tablet (40 mg total) by mouth daily for 2 days. (Patient not taking: Reported on 08/25/2023)   HYDROcodone -acetaminophen  (NORCO/VICODIN) 5-325 MG tablet  Take 1 tablet by mouth every 6 (six) hours as needed for moderate pain (pain score 4-6). (Patient not taking: Reported on 08/25/2023)   hydrocortisone  cream 1 % Apply 1 application topically 2 (two) times daily as needed (wrists). (Patient not taking: Reported on 08/25/2023)   hydrocortisone  valerate cream (WESTCORT ) 0.2 % Apply 1 application topically 2 (two) times daily as needed (for itching). Cleanse behind ears with gentle cleanser and dry completely. (Patient not taking: Reported on 08/25/2023)   omeprazole  (PRILOSEC) 20 MG capsule Take 20 mg by mouth daily. (Patient not taking: Reported on 08/25/2023)   [DISCONTINUED] acetaminophen  (TYLENOL ) 325 MG tablet Take 650 mg by mouth every 4 (four) hours as needed for fever.  Give 650 mg by mouth at bedtime   No facility-administered encounter medications on file as of 08/25/2023.    Review of Systems  Unable to perform ROS: Acuity of condition    Immunization History  Administered Date(s) Administered   Moderna SARS-COV2 Booster Vaccination 12/25/2019   Moderna Sars-Covid-2 Vaccination 02/17/2019, 03/17/2019   Pneumococcal Conjugate-13 09/01/2015   Pneumococcal Polysaccharide-23 07/07/1998   Td 08/21/2009   Tdap 09/22/2022   Pertinent  Health Maintenance Due  Topic Date Due   DEXA SCAN  Completed   INFLUENZA VACCINE  Discontinued      03/20/2021   12:00 AM 03/20/2021   12:00 PM 02/05/2022    9:16 AM 03/09/2022   10:01 AM 07/23/2022    3:44 PM  Fall Risk  Falls in the past year?   0 0 0  Was there an injury with Fall?   0 0 0  Fall Risk Category Calculator   0 0 0  Fall Risk Category (Retired)   Low     (RETIRED) Patient Fall Risk Level High fall risk  High fall risk  High fall risk     Patient at Risk for Falls Due to   History of fall(s) No Fall Risks No Fall Risks  Fall risk Follow up   Falls evaluation completed  Falls evaluation completed  Falls evaluation completed     Data  saved with a previous flowsheet row definition    Functional Status Survey:    Vitals:   08/25/23 1145  BP: (!) 175/71  Pulse: 83  Resp: (!) 23  Temp: 98.2 F (36.8 C)  SpO2: (!) 85%  Weight: 148 lb 12.8 oz (67.5 kg)  Height: 5' 2 (1.575 m)   Body mass index is 27.22 kg/m. Physical Exam Vitals and nursing note reviewed.  Constitutional:      General: She is in acute distress.     Appearance: She is ill-appearing.  HENT:     Head: Normocephalic and atraumatic.     Nose: Nose normal.     Mouth/Throat:     Mouth: Mucous membranes are dry.  Eyes:     Extraocular Movements: Extraocular movements intact.     Conjunctiva/sclera: Conjunctivae normal.     Pupils: Pupils are equal, round, and reactive to light.     Comments: Able to count my fingers 3 feet away from her eyes.   Cardiovascular:     Rate and Rhythm: Normal rate and regular rhythm.     Heart sounds: No murmur heard. Pulmonary:     Effort: Respiratory distress present.     Breath sounds: Wheezing, rhonchi and rales present.  Abdominal:     General: Bowel sounds are normal.     Palpations: Abdomen is soft.     Tenderness: There is no abdominal tenderness.  Musculoskeletal:     Cervical back: Normal range of motion and neck supple.     Right lower leg: No edema.     Left lower leg: No edema.     Comments:       Skin:    General: Skin is warm and dry.     Comments:    Neurological:     General: No focal deficit present.     Mental Status: She is alert. Mental status is at baseline.     Motor: No weakness.     Coordination: Coordination normal.     Gait: Gait abnormal.  Psychiatric:     Comments: restlessness     Labs reviewed: Recent Labs    01/20/23 0000 05/09/23 0000 05/13/23 0000  NA 139 141 138  K 3.6 3.8 3.6  CL 98* 98* 96*  CO2 33* 34* 34*  BUN 19 22* 25*  CREATININE 1.1 1.3* 1.0  CALCIUM  9.1 9.3 9.3   Recent Labs    01/20/23 0000  AST 12*  ALT 12  ALKPHOS 117  ALBUMIN 3.7   Recent Labs    01/20/23 0000  05/09/23 0000 05/13/23 0000  WBC 7.5 7.8 10.3  NEUTROABS 5,588.00 6,053.00  --   HGB 10.5* 9.5* 9.3*  HCT 33* 29* 28*  PLT 218 179 211   Lab Results  Component Value Date   TSH 1.03 08/13/2022   Lab Results  Component Value Date   HGBA1C 6.6 08/13/2022   Lab Results  Component Value Date   CHOL 192 03/19/2021   HDL 69 03/19/2021   LDLCALC 107 (H) 03/19/2021   LDLDIRECT 114.7 08/18/2011   TRIG 78 03/19/2021   CHOLHDL 2.8 03/19/2021    Significant Diagnostic Results in last 30 days:  No results found.  Assessment/Plan Respiratory failure with hypoxia (HCC) acute respiratory distress-labored breathing, tachypnea, O2 desaturation regardless O2 2-3lmp via [place]. The patient appears restless, unable to take po meds or food. Her goal of care is comfort measure only.  Will have Morphine, Lorazepam  available to her for comfort measures.  Congestive heart failure (CHF) (HCC) No apparent dependent edema.   COPD (chronic obstructive pulmonary disease) with chronic bronchitis SOB, wheezing, Morphine, Neb, O2 available to her.   HTN (hypertension) not controlled Bp, comfort measure.   Fibromyalgia  chronic pain is managed with Morphine.     Family/ staff Communication: plan of care reviewed with the patient and charge nurse.   Labs/tests ordered:  none

## 2023-08-25 NOTE — Assessment & Plan Note (Signed)
 SOB, wheezing, Morphine, Neb, O2 available to her.

## 2023-08-25 NOTE — Assessment & Plan Note (Signed)
 chronic pain is managed with Morphine.

## 2023-08-26 ENCOUNTER — Encounter: Payer: Self-pay | Admitting: Nurse Practitioner

## 2023-08-26 DIAGNOSIS — H900 Conductive hearing loss, bilateral: Secondary | ICD-10-CM | POA: Diagnosis not present

## 2023-08-26 DIAGNOSIS — J449 Chronic obstructive pulmonary disease, unspecified: Secondary | ICD-10-CM | POA: Diagnosis not present

## 2023-08-26 DIAGNOSIS — N1831 Chronic kidney disease, stage 3a: Secondary | ICD-10-CM | POA: Diagnosis not present

## 2023-08-26 DIAGNOSIS — F02A4 Dementia in other diseases classified elsewhere, mild, with anxiety: Secondary | ICD-10-CM | POA: Diagnosis not present

## 2023-08-26 DIAGNOSIS — I5020 Unspecified systolic (congestive) heart failure: Secondary | ICD-10-CM | POA: Diagnosis not present

## 2023-08-26 DIAGNOSIS — J309 Allergic rhinitis, unspecified: Secondary | ICD-10-CM | POA: Diagnosis not present

## 2023-08-27 DIAGNOSIS — I5020 Unspecified systolic (congestive) heart failure: Secondary | ICD-10-CM | POA: Diagnosis not present

## 2023-08-27 DIAGNOSIS — J309 Allergic rhinitis, unspecified: Secondary | ICD-10-CM | POA: Diagnosis not present

## 2023-08-27 DIAGNOSIS — H900 Conductive hearing loss, bilateral: Secondary | ICD-10-CM | POA: Diagnosis not present

## 2023-08-27 DIAGNOSIS — N1831 Chronic kidney disease, stage 3a: Secondary | ICD-10-CM | POA: Diagnosis not present

## 2023-08-27 DIAGNOSIS — F02A4 Dementia in other diseases classified elsewhere, mild, with anxiety: Secondary | ICD-10-CM | POA: Diagnosis not present

## 2023-08-27 DIAGNOSIS — J449 Chronic obstructive pulmonary disease, unspecified: Secondary | ICD-10-CM | POA: Diagnosis not present

## 2023-09-16 DEATH — deceased
